# Patient Record
Sex: Male | Born: 1935
Health system: Southern US, Community
[De-identification: ages and names within clinical notes are randomized; demographics above are authoritative.]

## PROBLEM LIST (undated history)

## (undated) DIAGNOSIS — I639 Cerebral infarction, unspecified: Secondary | ICD-10-CM

## (undated) DIAGNOSIS — J111 Influenza due to unidentified influenza virus with other respiratory manifestations: Secondary | ICD-10-CM

## (undated) DIAGNOSIS — K5730 Diverticulosis of large intestine without perforation or abscess without bleeding: Secondary | ICD-10-CM

## (undated) DIAGNOSIS — M1990 Unspecified osteoarthritis, unspecified site: Secondary | ICD-10-CM

## (undated) DIAGNOSIS — M109 Gout, unspecified: Secondary | ICD-10-CM

## (undated) DIAGNOSIS — C61 Malignant neoplasm of prostate: Secondary | ICD-10-CM

## (undated) DIAGNOSIS — F329 Major depressive disorder, single episode, unspecified: Secondary | ICD-10-CM

## (undated) DIAGNOSIS — N281 Cyst of kidney, acquired: Secondary | ICD-10-CM

## (undated) DIAGNOSIS — E213 Hyperparathyroidism, unspecified: Secondary | ICD-10-CM

## (undated) DIAGNOSIS — I10 Essential (primary) hypertension: Secondary | ICD-10-CM

## (undated) DIAGNOSIS — N186 End stage renal disease: Secondary | ICD-10-CM

## (undated) DIAGNOSIS — Z8546 Personal history of malignant neoplasm of prostate: Secondary | ICD-10-CM

## (undated) DIAGNOSIS — E785 Hyperlipidemia, unspecified: Secondary | ICD-10-CM

## (undated) DIAGNOSIS — K649 Unspecified hemorrhoids: Secondary | ICD-10-CM

## (undated) DIAGNOSIS — N3040 Irradiation cystitis without hematuria: Secondary | ICD-10-CM

## (undated) DIAGNOSIS — Z9289 Personal history of other medical treatment: Secondary | ICD-10-CM

## (undated) DIAGNOSIS — D509 Iron deficiency anemia, unspecified: Secondary | ICD-10-CM

## (undated) HISTORY — DX: Cerebral infarction, unspecified: I63.9

## (undated) HISTORY — DX: Hyperparathyroidism, unspecified: E21.3

## (undated) HISTORY — DX: Hyperlipidemia, unspecified: E78.5

## (undated) HISTORY — DX: Gout, unspecified: M10.9

## (undated) HISTORY — DX: Cyst of kidney, acquired: N28.1

## (undated) HISTORY — DX: Major depressive disorder, single episode, unspecified: F32.9

## (undated) HISTORY — DX: Diverticulosis of large intestine without perforation or abscess without bleeding: K57.30

## (undated) HISTORY — PX: TONSILLECTOMY: SUR1361

## (undated) HISTORY — DX: Personal history of malignant neoplasm of prostate: Z85.46

## (undated) HISTORY — DX: Iron deficiency anemia, unspecified: D50.9

## (undated) HISTORY — PX: JOINT REPLACEMENT: SHX530

## (undated) HISTORY — DX: Essential (primary) hypertension: I10

---

## 2000-11-06 DIAGNOSIS — C61 Malignant neoplasm of prostate: Secondary | ICD-10-CM

## 2000-11-06 HISTORY — DX: Malignant neoplasm of prostate: C61

## 2001-08-19 ENCOUNTER — Other Ambulatory Visit: Admission: RE | Admit: 2001-08-19 | Discharge: 2001-08-19 | Payer: Self-pay | Admitting: Urology

## 2001-08-19 ENCOUNTER — Encounter (INDEPENDENT_AMBULATORY_CARE_PROVIDER_SITE_OTHER): Payer: Self-pay | Admitting: Specialist

## 2001-09-03 ENCOUNTER — Ambulatory Visit: Admission: RE | Admit: 2001-09-03 | Discharge: 2001-12-02 | Payer: Self-pay | Admitting: Radiation Oncology

## 2001-12-03 ENCOUNTER — Ambulatory Visit: Admission: RE | Admit: 2001-12-03 | Discharge: 2002-03-03 | Payer: Self-pay | Admitting: Radiation Oncology

## 2002-04-11 ENCOUNTER — Inpatient Hospital Stay (HOSPITAL_COMMUNITY): Admission: AD | Admit: 2002-04-11 | Discharge: 2002-04-18 | Payer: Self-pay | Admitting: Endocrinology

## 2002-04-13 ENCOUNTER — Encounter: Payer: Self-pay | Admitting: Internal Medicine

## 2002-04-17 ENCOUNTER — Encounter: Payer: Self-pay | Admitting: Internal Medicine

## 2004-07-18 ENCOUNTER — Ambulatory Visit (HOSPITAL_COMMUNITY): Admission: RE | Admit: 2004-07-18 | Discharge: 2004-07-18 | Payer: Self-pay | Admitting: Internal Medicine

## 2005-01-02 ENCOUNTER — Encounter: Admission: RE | Admit: 2005-01-02 | Discharge: 2005-01-02 | Payer: Self-pay | Admitting: Urology

## 2005-06-14 ENCOUNTER — Ambulatory Visit: Payer: Self-pay | Admitting: Internal Medicine

## 2005-07-19 ENCOUNTER — Ambulatory Visit: Payer: Self-pay | Admitting: Internal Medicine

## 2005-10-02 ENCOUNTER — Ambulatory Visit: Payer: Self-pay | Admitting: Internal Medicine

## 2005-10-27 ENCOUNTER — Ambulatory Visit (HOSPITAL_COMMUNITY): Admission: RE | Admit: 2005-10-27 | Discharge: 2005-10-27 | Payer: Self-pay | Admitting: Neurosurgery

## 2005-11-06 HISTORY — PX: OTHER SURGICAL HISTORY: SHX169

## 2005-11-27 ENCOUNTER — Ambulatory Visit: Payer: Self-pay | Admitting: Internal Medicine

## 2005-12-05 ENCOUNTER — Ambulatory Visit: Payer: Self-pay

## 2005-12-27 ENCOUNTER — Inpatient Hospital Stay (HOSPITAL_COMMUNITY): Admission: RE | Admit: 2005-12-27 | Discharge: 2006-01-01 | Payer: Self-pay | Admitting: Orthopedic Surgery

## 2006-12-12 ENCOUNTER — Encounter: Admission: RE | Admit: 2006-12-12 | Discharge: 2006-12-12 | Payer: Self-pay | Admitting: Nephrology

## 2007-01-01 ENCOUNTER — Encounter: Admission: RE | Admit: 2007-01-01 | Discharge: 2007-04-01 | Payer: Self-pay | Admitting: Nephrology

## 2007-01-01 ENCOUNTER — Ambulatory Visit: Payer: Self-pay | Admitting: Internal Medicine

## 2007-01-02 LAB — CONVERTED CEMR LAB
ALT: 33 units/L (ref 0–40)
Albumin: 3.2 g/dL — ABNORMAL LOW (ref 3.5–5.2)
BUN: 43 mg/dL — ABNORMAL HIGH (ref 6–23)
Basophils Relative: 0.6 % (ref 0.0–1.0)
Bilirubin, Direct: 0.1 mg/dL (ref 0.0–0.3)
Chloride: 108 meq/L (ref 96–112)
Cholesterol: 140 mg/dL (ref 0–200)
Eosinophils Absolute: 0.1 10*3/uL (ref 0.0–0.6)
GFR calc Af Amer: 30 mL/min
Glucose, Bld: 103 mg/dL — ABNORMAL HIGH (ref 70–99)
HDL: 43.8 mg/dL (ref >39.0)
Hemoglobin: 7.6 g/dL — CL (ref 13.0–17.0)
Lymphocytes Relative: 19 % (ref 12.0–46.0)
MCHC: 35.1 g/dL (ref 30.0–36.0)
MCV: 89.5 fL (ref 78.0–100.0)
Neutro Abs: 3.3 10*3/uL (ref 1.4–7.7)
Phosphorus: 3.6 mg/dL (ref 2.3–4.6)
RDW: 12.6 % (ref 11.5–14.6)
TSH: 0.98 microintl units/mL (ref 0.35–5.50)
Total CHOL/HDL Ratio: 3.2
Triglycerides: 95 mg/dL (ref 0–149)
Uric Acid, Serum: 7.5 mg/dL — ABNORMAL HIGH (ref 2.4–7.0)
VLDL: 19 mg/dL (ref 0–40)
WBC: 5.3 10*3/uL (ref 4.5–10.5)

## 2007-01-07 ENCOUNTER — Ambulatory Visit (HOSPITAL_COMMUNITY): Admission: RE | Admit: 2007-01-07 | Discharge: 2007-01-07 | Payer: Self-pay | Admitting: Internal Medicine

## 2007-02-05 HISTORY — PX: OTHER SURGICAL HISTORY: SHX169

## 2007-05-28 ENCOUNTER — Encounter: Admission: RE | Admit: 2007-05-28 | Discharge: 2007-05-28 | Payer: Self-pay | Admitting: Orthopedic Surgery

## 2007-05-30 ENCOUNTER — Ambulatory Visit (HOSPITAL_BASED_OUTPATIENT_CLINIC_OR_DEPARTMENT_OTHER): Admission: RE | Admit: 2007-05-30 | Discharge: 2007-05-30 | Payer: Self-pay | Admitting: Orthopedic Surgery

## 2008-01-03 ENCOUNTER — Encounter: Payer: Self-pay | Admitting: Internal Medicine

## 2008-01-07 ENCOUNTER — Telehealth: Payer: Self-pay | Admitting: Internal Medicine

## 2008-01-07 ENCOUNTER — Encounter: Payer: Self-pay | Admitting: Internal Medicine

## 2008-02-10 ENCOUNTER — Telehealth: Payer: Self-pay | Admitting: Internal Medicine

## 2008-02-10 ENCOUNTER — Encounter: Payer: Self-pay | Admitting: Internal Medicine

## 2008-02-20 ENCOUNTER — Encounter: Payer: Self-pay | Admitting: Internal Medicine

## 2008-03-09 ENCOUNTER — Ambulatory Visit: Payer: Self-pay | Admitting: Internal Medicine

## 2008-03-09 DIAGNOSIS — K5730 Diverticulosis of large intestine without perforation or abscess without bleeding: Secondary | ICD-10-CM

## 2008-03-09 DIAGNOSIS — Z8546 Personal history of malignant neoplasm of prostate: Secondary | ICD-10-CM

## 2008-03-09 DIAGNOSIS — E785 Hyperlipidemia, unspecified: Secondary | ICD-10-CM

## 2008-03-09 DIAGNOSIS — D509 Iron deficiency anemia, unspecified: Secondary | ICD-10-CM | POA: Insufficient documentation

## 2008-03-09 DIAGNOSIS — N186 End stage renal disease: Secondary | ICD-10-CM

## 2008-03-09 DIAGNOSIS — R5381 Other malaise: Secondary | ICD-10-CM | POA: Insufficient documentation

## 2008-03-09 DIAGNOSIS — I10 Essential (primary) hypertension: Secondary | ICD-10-CM | POA: Insufficient documentation

## 2008-03-09 DIAGNOSIS — M109 Gout, unspecified: Secondary | ICD-10-CM

## 2008-03-09 DIAGNOSIS — R5383 Other fatigue: Secondary | ICD-10-CM

## 2008-03-09 DIAGNOSIS — N259 Disorder resulting from impaired renal tubular function, unspecified: Secondary | ICD-10-CM | POA: Insufficient documentation

## 2008-03-09 HISTORY — DX: End stage renal disease: N18.6

## 2008-03-09 HISTORY — DX: Hyperlipidemia, unspecified: E78.5

## 2008-03-09 HISTORY — DX: Diverticulosis of large intestine without perforation or abscess without bleeding: K57.30

## 2008-03-09 HISTORY — DX: Personal history of malignant neoplasm of prostate: Z85.46

## 2008-03-09 HISTORY — DX: Gout, unspecified: M10.9

## 2008-03-09 HISTORY — DX: Essential (primary) hypertension: I10

## 2008-03-09 HISTORY — DX: Iron deficiency anemia, unspecified: D50.9

## 2008-05-18 ENCOUNTER — Telehealth: Payer: Self-pay | Admitting: Internal Medicine

## 2008-08-24 ENCOUNTER — Encounter: Payer: Self-pay | Admitting: Internal Medicine

## 2008-08-27 ENCOUNTER — Encounter: Payer: Self-pay | Admitting: Internal Medicine

## 2008-10-13 ENCOUNTER — Ambulatory Visit (HOSPITAL_COMMUNITY): Admission: RE | Admit: 2008-10-13 | Discharge: 2008-10-13 | Payer: Self-pay | Admitting: Nephrology

## 2008-11-06 DIAGNOSIS — N3040 Irradiation cystitis without hematuria: Secondary | ICD-10-CM

## 2008-11-06 HISTORY — DX: Irradiation cystitis without hematuria: N30.40

## 2009-03-01 ENCOUNTER — Encounter: Payer: Self-pay | Admitting: Internal Medicine

## 2009-03-11 ENCOUNTER — Encounter: Payer: Self-pay | Admitting: Internal Medicine

## 2009-03-15 ENCOUNTER — Encounter: Payer: Self-pay | Admitting: Internal Medicine

## 2009-05-31 ENCOUNTER — Telehealth: Payer: Self-pay | Admitting: Internal Medicine

## 2009-06-02 ENCOUNTER — Ambulatory Visit: Payer: Self-pay | Admitting: Internal Medicine

## 2009-06-22 ENCOUNTER — Encounter: Payer: Self-pay | Admitting: Internal Medicine

## 2009-07-20 ENCOUNTER — Encounter (INDEPENDENT_AMBULATORY_CARE_PROVIDER_SITE_OTHER): Payer: Self-pay | Admitting: *Deleted

## 2009-08-06 DIAGNOSIS — K649 Unspecified hemorrhoids: Secondary | ICD-10-CM

## 2009-08-06 HISTORY — DX: Unspecified hemorrhoids: K64.9

## 2009-08-11 ENCOUNTER — Ambulatory Visit: Payer: Self-pay | Admitting: Internal Medicine

## 2009-08-25 ENCOUNTER — Ambulatory Visit: Payer: Self-pay | Admitting: Internal Medicine

## 2009-09-15 ENCOUNTER — Encounter: Payer: Self-pay | Admitting: Internal Medicine

## 2009-10-04 ENCOUNTER — Encounter: Payer: Self-pay | Admitting: Internal Medicine

## 2009-12-17 ENCOUNTER — Encounter: Payer: Self-pay | Admitting: Internal Medicine

## 2010-03-16 ENCOUNTER — Encounter: Payer: Self-pay | Admitting: Internal Medicine

## 2010-04-11 ENCOUNTER — Encounter: Payer: Self-pay | Admitting: Internal Medicine

## 2010-04-18 ENCOUNTER — Encounter: Payer: Self-pay | Admitting: Internal Medicine

## 2010-06-03 ENCOUNTER — Encounter: Payer: Self-pay | Admitting: Internal Medicine

## 2010-06-03 ENCOUNTER — Ambulatory Visit: Payer: Self-pay | Admitting: Internal Medicine

## 2010-06-03 DIAGNOSIS — M25519 Pain in unspecified shoulder: Secondary | ICD-10-CM

## 2010-06-23 ENCOUNTER — Telehealth: Payer: Self-pay | Admitting: Internal Medicine

## 2010-07-07 ENCOUNTER — Encounter: Payer: Self-pay | Admitting: Internal Medicine

## 2010-07-13 ENCOUNTER — Encounter: Payer: Self-pay | Admitting: Internal Medicine

## 2010-09-21 ENCOUNTER — Encounter: Payer: Self-pay | Admitting: Internal Medicine

## 2010-10-07 ENCOUNTER — Encounter: Payer: Self-pay | Admitting: Internal Medicine

## 2010-12-04 LAB — CONVERTED CEMR LAB
ALT: 15 units/L (ref 0–53)
ALT: 16 units/L (ref 0–53)
AST: 19 units/L (ref 0–37)
Albumin: 4 g/dL (ref 3.5–5.2)
Albumin: 4.3 g/dL (ref 3.5–5.2)
Alkaline Phosphatase: 47 units/L (ref 39–117)
Alkaline Phosphatase: 77 units/L (ref 39–117)
BUN: 32 mg/dL — ABNORMAL HIGH (ref 6–23)
BUN: 52 mg/dL — ABNORMAL HIGH (ref 6–23)
Basophils Absolute: 0 10*3/uL (ref 0.0–0.1)
Basophils Relative: 0 % (ref 0.0–1.0)
Basophils Relative: 0.8 % (ref 0.0–3.0)
Bilirubin, Direct: 0.1 mg/dL (ref 0.0–0.3)
Bilirubin, Direct: 0.1 mg/dL (ref 0.0–0.3)
CO2: 29 meq/L (ref 19–32)
CO2: 30 meq/L (ref 19–32)
Calcium: 9.5 mg/dL (ref 8.4–10.5)
Chloride: 108 meq/L (ref 96–112)
Cholesterol: 162 mg/dL (ref 0–200)
Cholesterol: 169 mg/dL (ref 0–200)
Creatinine, Ser: 3 mg/dL — ABNORMAL HIGH (ref 0.4–1.5)
Eosinophils Absolute: 0.1 10*3/uL (ref 0.0–0.7)
Eosinophils Absolute: 0.2 10*3/uL (ref 0.0–0.7)
GFR calc Af Amer: 28 mL/min
GFR calc non Af Amer: 24 mL/min
Glucose, Bld: 81 mg/dL (ref 70–99)
Glucose, Bld: 95 mg/dL (ref 70–99)
HCT: 35.7 % — ABNORMAL LOW (ref 39.0–52.0)
HCT: 37.2 % — ABNORMAL LOW (ref 39.0–52.0)
HDL: 52.1 mg/dL (ref >39.00)
Hemoglobin: 12.1 g/dL — ABNORMAL LOW (ref 13.0–17.0)
Iron: 50 ug/dL (ref 42–165)
Iron: 77 ug/dL (ref 42–165)
LDL Cholesterol: 102 mg/dL — ABNORMAL HIGH (ref 0–99)
LDL Cholesterol: 81 mg/dL (ref 0–99)
LDL Cholesterol: 91 mg/dL (ref 0–99)
Lymphocytes Relative: 17.2 % (ref 12.0–46.0)
Lymphs Abs: 0.9 10*3/uL (ref 0.7–4.0)
MCV: 90.4 fL (ref 78.0–100.0)
Monocytes Absolute: 0.6 10*3/uL (ref 0.1–1.0)
Neutro Abs: 4 10*3/uL (ref 1.4–7.7)
Neutrophils Relative %: 66.2 % (ref 43.0–77.0)
Potassium: 4.9 meq/L (ref 3.5–5.1)
RDW: 14.6 % (ref 11.5–14.6)
RDW: 17.9 % — ABNORMAL HIGH (ref 11.5–14.6)
Saturation Ratios: 16.8 % — ABNORMAL LOW (ref 20.0–50.0)
Sodium: 139 meq/L (ref 135–145)
Sodium: 147 meq/L — ABNORMAL HIGH (ref 135–145)
TSH: 0.77 microintl units/mL (ref 0.35–5.50)
TSH: 0.99 microintl units/mL (ref 0.35–5.50)
TSH: 1.01 microintl units/mL (ref 0.35–5.50)
Total Bilirubin: 0.5 mg/dL (ref 0.3–1.2)
Total Bilirubin: 0.8 mg/dL (ref 0.3–1.2)
Total CHOL/HDL Ratio: 3
Total Protein: 6.7 g/dL (ref 6.0–8.3)
Total Protein: 6.8 g/dL (ref 6.0–8.3)
Total Protein: 7.2 g/dL (ref 6.0–8.3)
Transferrin: 213 mg/dL (ref 212.0–360.0)
Triglycerides: 88 mg/dL (ref 0.0–149.0)
Triglycerides: 99 mg/dL (ref 0.0–149.0)
WBC: 4.4 10*3/uL — ABNORMAL LOW (ref 4.5–10.5)
WBC: 4.7 10*3/uL (ref 4.5–10.5)

## 2010-12-06 NOTE — Letter (Signed)
Summary: External Correspondence  External Correspondence   Imported By: Phillis Knack 03/21/2010 10:37:14  _____________________________________________________________________  External Attachment:    Type:   Image     Comment:   External Document

## 2010-12-06 NOTE — Assessment & Plan Note (Signed)
Summary: yearly f/u/needs refills/cd - OK'D   Vital Signs:  Patient profile:   77 year old male Height:      69 inches Weight:      163 pounds BMI:     24.16 O2 Sat:      97 % on Room air Temp:     98.3 degrees F oral Pulse rate:   65 / minute BP sitting:   110 / 52  (left arm) Cuff size:   regular  Vitals Entered By: Shirlean Mylar Ewing CMA Deborra Medina) (June 03, 2010 2:05 PM)  O2 Flow:  Room air  Preventive Care Screening  Colonoscopy:    Next Due:  09/2019     declines pneumonia and flu shots  CC: Yearly/RE   CC:  Yearly/RE.  History of Present Illness: overall doing well,  does c/o left shoudle rpian for 4 to5 wks, localized but can radiate to the upper arm, achy like, more or less constant not;  worse to abduct certain ways;  no neck or radicular pain;  no more distal pain, weak, numbness.  No recetn injury or fall. IS s/p right rotater cuff, and left hip replacement, but no prior surgury on the left.  .Pt denies CP, sob, doe, wheezing, orthopnea, pnd, worsening LE edema, palps, dizziness or syncope  Pt denies new neuro symptoms such as headache, facial or extremity weakness  No fever, wt loss, night sweats, loss of appetite or other constitutional symptoms though has lost 11 lbs in the past yr.   Here for wellness Diet: Heart Healthy or DM if diabetic Physical Activities: goes to gyn 3 times per wk, as well as bowling Depression/mood screen: Negative Hearing: Intact bilateral Visual Acuity: Grossly normal, gets exam yearly ADL's: Capable  Fall Risk: None Home Safety: Good Cognitive Impairment:  Gen appearance, affect, speech, memory, attention & motor skills grossly intact End-of-Life Planning: Advance directive - Full code/I agree   Problems Prior to Update: 1)  Preventive Health Care  (ICD-V70.0) 2)  Shoulder Pain, Left  (ICD-719.41) 3)  Fatigue  (ICD-780.79) 4)  Anemia-iron Deficiency  (ICD-280.9) 5)  Renal Insufficiency  (ICD-588.9) 6)  Gout  (ICD-274.9) 7)   Hyperlipidemia  (ICD-272.4) 8)  Diverticulosis, Colon  (ICD-562.10) 9)  Hypertension  (ICD-401.9) 10)  Prostate Cancer, Hx of  (ICD-V10.46)  Medications Prior to Update: 1)  Atenolol 50 Mg  Tabs (Atenolol) .... 1/2 Tab By Mouth Once Daily 2)  Allopurinol 100 Mg  Tabs (Allopurinol) .Marland Kitchen.. 1po Once Daily 3)  Crestor 20 Mg Tabs (Rosuvastatin Calcium) .Marland Kitchen.. 1 By Mouth Once Daily 4)  Norvasc 2.5 Mg Tabs (Amlodipine Besylate) .... Take 1 Tablet By Mouth Once A Day 5)  Hectorol 0.5 Mcg  Caps (Doxercalciferol) .Marland Kitchen.. 1 By Mouth Once Daily 6)  Adult Aspirin Ec Low Strength 81 Mg  Tbec (Aspirin) .Marland Kitchen.. 1po Qd  Current Medications (verified): 1)  Atenolol 50 Mg  Tabs (Atenolol) .... 1/2 Tab By Mouth Once Daily 2)  Allopurinol 100 Mg  Tabs (Allopurinol) .Marland Kitchen.. 1po Once Daily 3)  Crestor 20 Mg Tabs (Rosuvastatin Calcium) .Marland Kitchen.. 1 By Mouth Once Daily 4)  Norvasc 2.5 Mg Tabs (Amlodipine Besylate) .... Take 1 Tablet By Mouth Once A Day 5)  Hectorol 0.5 Mcg  Caps (Doxercalciferol) .Marland Kitchen.. 1 By Mouth Once Daily 6)  Adult Aspirin Ec Low Strength 81 Mg  Tbec (Aspirin) .Marland Kitchen.. 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 hx of pneumonia 2003 secondary hyperparathyroidism  MD roster:  sees also optometry for glasses  Past Surgical History: Tonsillectomy Rotator cuff repair - right - 4/08 -  s/p left hip replacement - 2007 - Dr Murphy/ortho  Review of Systems  The patient denies anorexia, fever,  vision loss, decreased hearing, hoarseness, chest pain, syncope, dyspnea on exertion, peripheral edema, prolonged cough, headaches, hemoptysis, abdominal pain, melena, hematochezia, severe indigestion/heartburn, hematuria, muscle weakness, suspicious skin lesions, difficulty walking, depression, unusual weight change, abnormal bleeding, enlarged lymph nodes, and angioedema.         all otherwise negative per pt -    Physical Exam  General:  alert and well-developed.   Head:  normocephalic and atraumatic.   Eyes:  vision grossly intact, pupils equal, and pupils round.   Ears:  R ear normal and L ear normal.   Nose:  no external deformity and no nasal discharge.   Mouth:  no gingival abnormalities and pharynx pink and moist.   Neck:  supple and no masses.   Lungs:  normal respiratory effort and normal breath sounds.   Heart:  normal rate and regular rhythm.   Abdomen:  soft, non-tender, and normal bowel sounds.   Msk:  no joint tenderness and no joint swelling.  except for mild left subacromail bursa area tender Extremities:  no edema, no erythema  Neurologic:  cranial nerves II-XII intact and strength normal in all extremities.     Impression & Recommendations:  Problem # 1:  Preventive Health Care (ICD-V70.0)  Overall doing well, age appropriate education and counseling updated and referral for appropriate preventive services done unless declined, immunizations up to date or declined, diet counseling done if overweight, urged to quit smoking if smokes , most recent labs reviewed and current ordered if appropriate, ecg reviewed or declined (interpretation per ECG scanned in the EMR if done); information regarding Medicare Prevention requirements given if appropriate; speciality referrals updated as appropriate   Orders: EKG w/ Interpretation (93000) First annual wellness visit with prevention plan  [id number])  Problem # 2:  SHOULDER PAIN, LEFT (ICD-719.41)  His updated medication list  for this problem includes:    Adult Aspirin Ec Low Strength 81 Mg Tbec (Aspirin) .Marland Kitchen... 1po qd ? bursitis/rot cuff - to refer to ortho  Orders: Orthopedic Surgeon Referral (Ortho Surgeon)  Problem # 3:  ANEMIA-IRON DEFICIENCY (ICD-280.9) for lab f/u today, asympt, no overt recent bleeding or bruising Orders: TLB-Hepatic/Liver Function Pnl (80076-HEPATIC) TLB-CBC Platelet - w/Differential (85025-CBCD) TLB-IBC Pnl (Iron/FE;Transferrin) (83550-IBC)  Problem # 4:  RENAL INSUFFICIENCY (ICD-588.9) chronic, sees Renal on regular basis, to check f/u labs today, Continue all previous medications as before this visit  Orders: TLB-BMP (Basic Metabolic Panel-BMET) (99991111) TLB-B12 + Folate Pnl [id number])  Problem # 5:  HYPERLIPIDEMIA (ICD-272.4)  His updated medication list for this problem includes:    Crestor 20 Mg Tabs (Rosuvastatin calcium) .Marland Kitchen... 1 by mouth once daily  Orders: TLB-Lipid Panel (80061-LIPID)  Labs Reviewed: SGOT: 19 (06/02/2009)   SGPT: 16 (06/02/2009)   HDL:52.10 (06/02/2009), 45.8 (03/09/2008)  LDL:91 (06/02/2009), 102 (03/09/2008)  Chol:161 (06/02/2009), 169 (03/09/2008)  Trig:88.0 (06/02/2009), 105 (  03/09/2008) stable overall by hx and exam, ok to continue meds/tx as is , Pt to continue diet efforts, good med tolerance; to check labs - goal LDL less than 70   Problem # 6:  HYPERTENSION (ICD-401.9)  His updated medication list for this problem includes:    Atenolol 50 Mg Tabs (Atenolol) .Marland Kitchen... 1/2 tab by mouth once daily    Norvasc 2.5 Mg Tabs (Amlodipine besylate) .Marland Kitchen... Take 1 tablet by mouth once a day  BP today: 110/52 Prior BP: 146/82 (06/02/2009)  Labs Reviewed: K+: 4.7 (06/02/2009) Creat: : 3.0 (06/02/2009)   Chol: 161 (06/02/2009)   HDL: 52.10 (06/02/2009)   LDL: 91 (06/02/2009)   TG: 88.0 (06/02/2009) stable overall by hx and exam, ok to continue meds/tx as is   Orders: Prescription Created Electronically [phone])  Complete Medication  List: 1)  Atenolol 50 Mg Tabs (Atenolol) .... 1/2 tab by mouth once daily 2)  Allopurinol 100 Mg Tabs (Allopurinol) .Marland Kitchen.. 1po once daily 3)  Crestor 20 Mg Tabs (Rosuvastatin calcium) .Marland Kitchen.. 1 by mouth once daily 4)  Norvasc 2.5 Mg Tabs (Amlodipine besylate) .... Take 1 tablet by mouth once a day 5)  Hectorol 0.5 Mcg Caps (Doxercalciferol) .Marland Kitchen.. 1 by mouth once daily 6)  Adult Aspirin Ec Low Strength 81 Mg Tbec (Aspirin) .Marland Kitchen.. 1po qd  Other Orders: TLB-TSH (Thyroid Stimulating Hormone) (84443-TSH)  Patient Instructions: 1)  Continue all previous medications as before this visit  2)  Please go to the Lab in the basement for your blood and/or urine tests today 3)  You will be contacted about the referral(s) to: Orthopedic 4)  Please keep your regular appts with Dr Deterding, and Dr Jeffie Pollock as you do 5)  Please schedule a follow-up appointment in 1 year or sooner if needed Prescriptions: HECTOROL 0.5 MCG  CAPS (DOXERCALCIFEROL) 1 by mouth once daily  #90 x 3   Entered and Authorized by:   [name] MD   Signed by:   [name] MD on 06/03/2010   Method used:   Print then Give to Patient   RxID:   [id number] NORVASC 2.5 MG TABS (AMLODIPINE BESYLATE) Take 1 tablet by mouth once a day  #90 x 3   Entered and Authorized by:   [name] MD   Signed by:   [name] MD on 06/03/2010   Method used:   Print then Give to Patient   RxID:   [id number] CRESTOR 20 MG TABS (ROSUVASTATIN CALCIUM) 1 by mouth once daily  #90 x 3   Entered and Authorized by:   [name] MD   Signed by:   [name] MD on 06/03/2010   Method used:   Print then Give to Patient   [id number] ALLOPURINOL 100 MG  TABS (ALLOPURINOL) 1po once daily  #90 x 3   Entered and Authorized by:   [name] MD   Signed by:   [name] MD on 06/03/2010   Method used:   Print then Give to Patient   RxID:   [id number] ATENOLOL 50 MG  TABS (ATENOLOL) 1/2 tab by mouth once daily  #45 x 3    Entered and Authorized by:   [name] MD   Signed by:   [name] MD on 06/03/2010   Method used:   Print then Give to Patient   RxID:   [id number]

## 2010-12-06 NOTE — Letter (Signed)
Summary: [facility]   Imported By: Phillis Knack 07/27/2010 [id number]  _____________________________________________________________________  External Attachment:    Type:   Image     Comment:   External Document

## 2010-12-06 NOTE — Letter (Signed)
Summary: [facility]   Imported By: Bubba Hales 05/05/2010 09:36:21  _____________________________________________________________________  External Attachment:    Type:   Image     Comment:   External Document

## 2010-12-06 NOTE — Letter (Signed)
Summary: [facility]   Imported By: Bubba Hales 09/27/2010 08:39:10  _____________________________________________________________________  External Attachment:    Type:   Image     Comment:   External Document

## 2010-12-06 NOTE — Letter (Signed)
Summary: [facility]   Imported By: Bubba Hales 01/06/2010 08:46:44  _____________________________________________________________________  External Attachment:    Type:   Image     Comment:   External Document

## 2010-12-06 NOTE — Progress Notes (Signed)
Summary: Rx refill req  Phone Note Refill Request Message from:  Patient on June 23, 2010 2:20 PM  Refills Requested: Medication #1:  ATENOLOL 50 MG  TABS 1/2 tab by mouth once daily   Dosage confirmed as above?Dosage Confirmed   Supply Requested: 1 year  Medication #2:  ALLOPURINOL 100 MG  TABS 1po once daily   Dosage confirmed as above?Dosage Confirmed   Supply Requested: 1 year  Medication #3:  CRESTOR 20 MG TABS 1 by mouth once daily   Dosage confirmed as above?Dosage Confirmed   Supply Requested: 1 year  Medication #4:  NORVASC 2.5 MG TABS Take 1 tablet by mouth once a day   Dosage confirmed as above?Dosage Confirmed   Supply Requested: 1 year  Method Requested: Electronic Initial call taken by: Crissie Sickles, CMA,  June 23, 2010 2:20 PM    Prescriptions: NORVASC 2.5 MG TABS (AMLODIPINE BESYLATE) Take 1 tablet by mouth once a day  #90 x 3   Entered by:   Crissie Sickles, CMA   Authorized by:   [name] MD   Signed by:   Crissie Sickles, CMA on 06/23/2010   Method used:   Faxed to ...       [facility] (mail-order)             , [place]         Ph: [phone]       Fax: [phone]   RxID:   [id number] CRESTOR 20 MG TABS (ROSUVASTATIN CALCIUM) 1 by mouth once daily  #90 x 3   Entered by:   Crissie Sickles, CMA   Authorized by:   [name] MD   Signed by:   Crissie Sickles, CMA on 06/23/2010   Method used:   Faxed to ...       [facility] (mail-order)             , [place]         Ph: [phone]       Fax: [phone]   [id number] ALLOPURINOL 100 MG  TABS (ALLOPURINOL) 1po once daily  #90 x 3   Entered by:   Crissie Sickles, CMA   Authorized by:   [name] MD   Signed by:   Crissie Sickles, CMA on 06/23/2010   Method used:   Faxed to ...       [facility] (mail-order)             , [place]         Ph: [phone]       Fax: [phone]   [id number] ATENOLOL 50 MG  TABS (ATENOLOL)  1/2 tab by mouth once daily  #45 x 3   Entered by:   Crissie Sickles, CMA   Authorized by:   [name] MD   Signed by:   Crissie Sickles, CMA on 06/23/2010   Method used:   Faxed to ...       [facility] (mail-order)             , [place]         Ph: [phone]       Fax: [phone]   RxID:   [id number]

## 2010-12-08 NOTE — Letter (Signed)
Summary: [facility]   Imported By: Bubba Hales 10/21/2010 10:26:29  _____________________________________________________________________  External Attachment:    Type:   Image     Comment:   External Document

## 2011-03-06 ENCOUNTER — Encounter: Payer: Self-pay | Admitting: Internal Medicine

## 2011-03-21 NOTE — Op Note (Signed)
[name], [name]                ACCOUNT NO.:  [id number]   MEDICAL RECORD NO.:  [id number]          PATIENT TYPE:  AMB   LOCATION:  [place]                          FACILITY:  [facility]   PHYSICIAN:  Rodney A. Mortenson, M.D.DATE OF BIRTH:  [birth year]   DATE OF PROCEDURE:  05/30/2007  DATE OF DISCHARGE:                               OPERATIVE REPORT   JUSTIFICATION:  A 77 year old man referred here courtesy of Dr. Jeneen Rinks  Deterding following a fall on the shoulder from a ladder three months  ago.  Significant pain and discomfort.  He has good range of motion  about the shoulder, some weakness to abduction.  Admission testing is  quite painful.  An MRI of the shoulder was done and this shows a massive  complete supraspinatus tear of the right shoulder with proximal  retraction of 4 to 5 cm.  There is bulky acromioclavicular  osteoarthritis.  Because persistent pain and discomfort he is now  admitted for surgical reconstruction.  Complication discussed  preoperatively.  Questions answered and encouraged.   PREOPERATIVE DIAGNOSIS:  Massive retracted rotator cuff tear, right  shoulder.   POSTOPERATIVE DIAGNOSIS:  Massive rotator cuff tear right shoulder; torn  anterior labrum; extensive synovitis right shoulder.   OPERATION:  Diagnostic arthroscopy; synovectomy right shoulder; debride  the anterior labrum; acromioplasty; repair of massive torn retracted  rotator cuff, complex extensive reconstruction.   SURGEON:  Geroge Baseman. Alphonzo Cruise, M.D.   ASSISTANT:  Vonita Moss. Duffy, P.A.   ANESTHESIA:  General.   PROCEDURE:  The patient placed on the operating table in supine  position.  After satisfactory general anesthesia he was placed in semi-  sitting position.  Right upper extremity and shoulder prepped with  DuraPrep and draped out in the usual manner.  Through standard posterior  portal arthroscope was introduced in the glenohumeral joint.  An  anterior operative portal was made in the  standard manner and position.  There was a massive rotator cuff tear and the humeral head was at least  60% uncovered.  There was an extensive synovitis about the shoulder and  through the anterior portal, intra-articular shaver was introduced and  synovectomy of the shoulder was then done.  There was fraying and  tearing of the anterior labrum which was not repairable and this torn  labrum was debrided with the intra-articular shaver.  Once this was  completed to my satisfaction the scope was placed subacromial position  and the huge tear could be seen.  There was an  anterior posterior leaf  which was retracted from each other, split longitudinally.  The  arthroscope was then removed and it was elected to open shoulder.   Saber cut incision made over the anterolateral aspect of the right  shoulder.  Skin edges were retracted.  Bleeders were coagulated.  Deltoid fibers released off the anterior aspect of acromion only.  This  is a type 3 acromion and the anterior leading edge was significantly  deformed.  An acromioplasty was then done.  This gave much better access  to the shoulder.  There is a great deal of  the bulky enlarged bursa and  this was all removed which gave excellent access and the visibility to  the shoulder.  There were huge tears of the rotator cuff the head was  almost totally uncovered.  This a very complex problem.  The leaves were  freed up and then could be retracted distally down to the anatomic  position.  Two Mitek anchors were used around the tuberosity.  Each leaf  could be pulled out independently and reattached in anatomic position.  Multiple sutures were used for the anterior and posterior portions of  the cuff.  Then a side-to-side repair was also done.  Complete  watertight repair was used using a heavy Mitek sutures.  I was very  pleased with surgical reconstruction once this was completed.  This good  range of motion and this passed easily underneath the  acromion.  Shoulder was irrigated with copious amounts saline solution.  Deltoid  fibers reattached with 0-0 Vicryl and 2-0 Vicryl used to close  subcutaneous tissue.  Stainless steel staples used to close the skin.  Sterile dressings were applied and the patient returned recovery room in  nice condition.   Again this was extremely complex extensive reconstruction which took  significant additional time.   DISPOSITION:  1. Discharge to home with  sling.  2. To my office next week.  3. Usual postop instructions given.  4. Percocet for pain.           ______________________________  Geroge Baseman. Alphonzo Cruise, M.D.     RAM/MEDQ  D:  05/30/2007  T:  05/31/2007  Job:  [id number]

## 2011-03-24 NOTE — Op Note (Signed)
NAMEJULIUS, [name] NO.:  [id number]   MEDICAL RECORD NO.:  [id number]          PATIENT TYPE:  INP   LOCATION:  5039                         FACILITY:  [facility]   PHYSICIAN:  Ninetta Lights, M.D. DATE OF BIRTH:  December 12, 1928   DATE OF PROCEDURE:  12/27/2005  DATE OF DISCHARGE:                                 OPERATIVE REPORT   PREOPERATIVE DIAGNOSES:  End stage degenerative arthritis, left hip,  underlying avascular necrosis with degenerative cystic change especially in  the acetabulum.   POSTOPERATIVE DIAGNOSES:  End stage degenerative arthritis, left hip,  underlying avascular necrosis with degenerative cystic change especially in  the acetabulum.   OPERATION PERFORMED:  Total hip replacement on the left.  Autologous  cancellous bone grafting to subchondral cyst, acetabulum.  [employer]  Accolade prosthesis.  Press-fit 56 mm metallic acetabular component screw  fixation times two.  40 degree internal diameter X3 polyethylene liner.  Femoral component size 3.5.  127 degree neck angle, 35 degree neck and a 40  mm plus 0 metallic head.   SURGEON:  Ninetta Lights, M.D.   ASSISTANT:  Alyson Locket. Velora Heckler.   ANESTHESIA:  General.   ESTIMATED BLOOD LOSS:  Less than 200 mL.   BLOOD REPLACED:  None.   SPECIMENS:  None.   CULTURES:  None.   COMPLICATIONS:  None.   DRESSING:  Soft compressive with abduction pillow.   DESCRIPTION OF PROCEDURE:  The patient was brought to the operating room and  after adequate anesthesia had been obtained, turned to a lateral position  with appropriate padding and support.  Prepped and draped in the usual  sterile fashion.  Involved left leg was about 1 cm or less shorter.  Incision along the shaft of the femur extending posterior and superior.  Skin and subcutaneous tissue divided.  Hemostasis cautery.  Iliotibial band  incised.  Charnley retractor put in place.  Neurovascular structures  identified and protected.   External rotator and capsule taken down to the  back of the intertrochanteric groove in the femur.  Hip dislocated  posteriorly.  Femoral neck cut one fingerbreadth above the lesser trochanter  in line with the definitive component.  Femoral head removed.  Grade 4  changes, bony collapse, delamination, all debris cleared.  Acetabulum  exposed. Sequential reaming up to good bleeding bone appropriate inferior  and medial placement and removal of periarticular spurs.  Sized for a 56 mm  component.  Extensive subchondral cyst extending 1 to 2 cm into the bone  above were completely curetted out back to healthy bone.  Cancellous bone  harvested from the femoral head firmly packed into the defect and then  smoothed off with the reamer placed in reverse.  The 56 mm component was  then put in place.  Hammered in with nice fitting capturing and alignment.  Place in 45 degrees of abduction and 20 degrees of anteversion.  Firmly  fixed with placement augmented with two screws placed through the cup.  A 40  mm diameter polyethylene X3 insert was then attached.  I was very pleased  with the alignment, capture and fixation of component.  Attention was turned  to the femur.  Distal and proximal reamers, appropriate sizing and  restoration of femoral anteversion.  Sized for a 3.5 femoral component.  After appropriate trials, a definitive component was seated.  Excellent  capturing and fixation.  With the 35 mm neck on the prosthesis and the 40 mm  +0 head attached, the hip was reduced.  Excellent restoration of leg  lengths.  Excellent congruity.  Good stability in flexion and extension.  Wound irrigated.  External rotator and capsule repaired to the back of the  intertrochanteric groove through drill holes, tied over a bony bridge.  Charnley retractor removed.  Iliotibial band closed with #1 Vicryl, skin and  subcutaneous tissue with Vicryl and staples.  Margins of the wound injected  with Marcaine.   Sterile compressive dressing applied.  Returned to supine  position.  Abduction pillow applied.  Anesthesia reversed.  Brought to  recovery room.  Tolerated surgery well without complication.      Ninetta Lights, M.D.  Electronically Signed     DFM/MEDQ  D:  12/27/2005  T:  12/28/2005  Job:  [id number]

## 2011-03-24 NOTE — Discharge Summary (Signed)
[place]. [facility]  Patient:    [name], [name] Visit Number: [id number] MRN: [id number]          Service Type: MED Location: [phone] Attending Physician:  Gloris Ham Dictated by:   Donaciano Eva, N.P. Admit Date:  04/11/2002 Discharge Date: 04/18/2002                             Discharge Summary  ADMISSION DIAGNOSES: Pneumonia.  DISCHARGE DIAGNOSES: Pneumonia.  PRIMARY CARE PHYSICIAN:  Dr. [name], [facility].  SIGNIFICANT LABORATORY DATA: During admission the CBC on April 17, 2002 shows white blood cell count 10.3, hemoglobin and hematocrit 8.1 and 24.1, platelet count 420K, RDW 15.2.  Hemoccult negative.  BMET on April 14, 2002 sodium 138, potassium 4.5, glucose 124, BUN 32, creatinine 2.1, calcium low at 8.3, albumin at 2.1.  There was a note in the blood work that the serum was slightly lipemic, therefore an office follow up on lipid level should be conducted.  Liver enzymes as follows:  On April 18, 2002 the AST had decreased from a high of 196 to 100, ALT was at 168, ALP was 148, total bilirubin 0.5, D-bilirubin less than 0.1, I-bilirubin was not calculated.  Anemia study as follows:  The iron was low at 35, total iron binding capacity was normal at 220, percent saturation low at 16, B12 normal at 550, folate normal at 13.6, ferritin elevated at 6643.  Folate level 13.6.  TSH normal at 0.911.  Blood cultures remained negative.  Significant diagnostics April 13, 2002 anterior right upper lobe and right middle lobe air space infiltrate consistent with pneumonia. April 17, 2002 chest x-ray stable, right upper lobe and right middle lobe air space infiltrates, new patchy lingular infiltrates.  DISCHARGE MEDICATIONS: Patient is to restart his home medications. 1. New medications:  FeSO4 325 mg q.d. with food. 2. Ceftin 250 mg one pill b.i.d. X3 more days per Dr. Jenny Reichmann.  HOSPITAL COURSE:  The patient is a pleasant gentleman who was  admitted complaining of weakness, elevated temperature, dry cough which was painful and localized to the anterior chest. When examined the patient was found to have the above-noted pneumonia on chest x-ray and was admitted for intravenous antibiotics and supportive care.  During his hospitalization his other concerns were maintaining his hypertension, monitoring his renal insufficiency.  The patient had also elevated transaminases, question if this was due to the pneumonia.  His normocytic, normochromic anemia, there was a question whether this was old or new.  The patient indicated it was old and also that he was being followed by Dr. Jeffie Pollock for history of prostate cancer, he is status post XRT X42 treatments.  His last PSA level was one month prior to admission and was at 2.7.  During this admission the patient was also given the Pneumovax vaccine on April 14, 2002.  He was hydrated, provided with oxygen via nasal cannula.  He was placed on Zithromax and Rocephin initially intravenously and then moved to p.o.  He was monitored during his stay for progress and indeed he improved on a daily basis.  The patient was a pleasant gentleman who performed all of his activities in his room and was involved with his progress.  PHYSICAL EXAMINATION ON DISCHARGE:  The patients vital signs were normal.  He is offering no complaints.  Temperature 97.1, pulse 86, respirations 20, blood pressure 129/68, 98% oxygen saturation on room  air.  His in and out was at 800 that morning.  His blood cultures remained negative X3 tubes with that as the final.  GENERAL:  He is awake, alert and oriented X3.  HEART:  His heart rate was regular.  CHEST:  Lungs had an initial left crackle which improved with deep breathing and coughing.  There was no wheezing.  EXTREMITIES:  There was no edema.  He was ambulating.  ABDOMEN:  Soft with good bowel sounds and nontender.  Overall the patient is eager to go home.  His  medications had been switched to p.o. and he is to continue until the completion of his antibiotic therapy and to follow up with Dr. Jenny Reichmann.  He is to call Dr. Jenny Reichmann for any temperature over 100 or if he has any concerns. Dictated by:   Donaciano Eva, N.P. Attending Physician:  Gloris Ham DD:  04/23/02 TD:  04/24/02 Job: [id number]

## 2011-03-24 NOTE — Discharge Summary (Signed)
[name], [name] NO.:  [id number]   MEDICAL RECORD NO.:  [id number]          PATIENT TYPE:  INP   LOCATION:  5039                         FACILITY:  [facility]   PHYSICIAN:  Ninetta Lights, M.D. DATE OF BIRTH:  [birth year]   DATE OF ADMISSION:  12/27/2005  DATE OF DISCHARGE:  01/01/2006                                 DISCHARGE SUMMARY   FINAL DIAGNOSES:  1.  Status post left total knee replacement for end-stage degenerative joint      disease.  2.  Hypertension.  3.  Long term use of anticoagulants.  4.  Hyperlipidemia.  5.  Gout.   HISTORY OF PRESENT ILLNESS:  A 77 year old black male with a history of end-  stage DJD of left hip and chronic pain presents to our office for  preoperative evaluation and treatment.  He has had progressively worsening  pain which failure to respond to conservative treatment.  He has been having  decrease in daily activities due to the ongoing complaint.   HOSPITAL COURSE:  On December 27, 2005, the patient was taken to the OR and  left total hip replacement procedure performed.   SURGEON:  Ninetta Lights, M.D.   ASSISTANT:  __________.   ANESTHESIA:  General.   ESTIMATED BLOOD LOSS:  200 ml.   There were no surgical or anesthesia complications.  The patient was  transferred to the recovery room in stable condition.  On December 28, 2005,  patient was doing well with good pain control.  Vital signs stable.  Afebrile.  Hemoglobin 8.3, glucose 148, INR 1.3.  Started on pharmacy  protocol Coumadin.  Wound looked good.  Staples intact.  Calves nontender.  Neurovascularly intact.  Monitored hemoglobin.  The patient has a history of  chronic anemia.   On December 29, 2005, the patient with good pain control.  Complained of  feeling weak and fatigued.  No chest pain or shortness of breath.  Temp  101.5, pulse 107, respirations 18, blood pressure 122/65.  Hemoglobin is  7.8, hematocrit 22.5.  Sodium 134, potassium 4.2,  chloride 105, CO2 21, BUN  24, creatinine 2, glucose 132, INR 1.7.  The wound looked good.  Staples  intact.  No drains or signs of infection.  Calf nontender.  Neurovascularly  intact.  Transfused 2 units of packed red blood cells.  DC Foley, hep-lock  IV after transfusion.  DC PCA.  Encourage incentive spirometry.   On December 29, 2005, patient with good pain control.  Temp 100.7, pulse 94,  respirations 20, blood pressure 125/64.  Hemoglobin 9.4, INR 1.7.   On December 31, 2005, the patient doing well.  Good pain control.  Temp 101.  Hemoglobin 9.2.  No bowel movement.  A Fleet ordered.   On January 01, 2006, the patient was doing well with good pain control.  Good hall ambulation and stairs completed without difficulty.  Had bowel  movement.  Ready to go home.  Vital signs stable.  Afebrile.  Hemoglobin  9.4, sodium 130, INR 1.7.  The wound looked good.  Staples intact.  No  signs  of infection.  Calf nontender.  Neurovascularly intact.   MEDICATIONS:  1.  Percocet 5/325 1-2 tabs p.o. q.4-6h. p.r.n. pain.  2.  Robaxin 500 mg 1 tab p.o. q.6h. p.r.n. spasms.  3.  Coumadin to be dosed by home health RN.  4.  Resume previous home meds.   CONDITION:  Good, stable.   INSTRUCTIONS:  Patient to work with home health PT/OT to improve ambulation  and strengthening.  Will follow up in the office two weeks postoperatively  for recheck.  Will remain on Coumadin x4 weeks for postoperative DVT  prophylaxis.      Ninetta Lights, M.D.  Electronically Signed     Ninetta Lights, M.D.  Electronically Signed    DFM/MEDQ  D:  03/12/2006  T:  03/12/2006  Job:  [id number]

## 2011-03-24 NOTE — H&P (Signed)
[place]. [facility]  Patient:    [name], LASOTA Visit Number: [id number] MRN: [id number]          Service Type: MED Location: [phone] Attending Physician:  Gloris Ham Dictated by:   Gloris Ham, M.D. [facility] Admit Date:  04/11/2002                           History and Physical  DATE OF BIRTH:  08/05/2029  REASON FOR ADMISSION:  Pneumonia.  HISTORY OF PRESENT ILLNESS:  The patient is a 77 year old man with several days of a dry cough which is painful and localized to the anterior chest.  He also has associated anorexia and severe fatigue.  PAST MEDICAL HISTORY: 1. Hypertension. 2. Prostate cancer. 3. Hemorrhoids. 4. Diverticulosis. 5. Dyslipidemia.  MEDICATIONS: 1. Atenolol 50 mg daily. 2. Aspirin 81 mg daily. 3. Hydrochlorothiazide 12.5 mg daily.  FAMILY HISTORY:  No one else at home is ill.  SOCIAL HISTORY:  The patient is married.  He is retired.  His wife is with him today.  REVIEW OF SYSTEMS:  Denies weight gain, weight loss, syncope, shortness of breath, diarrhea, nausea, vomiting, abdominal pain, rectal bleeding, dysuria, and skin rash.  PHYSICAL EXAMINATION:  The blood pressure is 140/60, heart rate 88, respiratory rate 20, and temperature 101.6 degrees.  WEIGHT:  178 pounds.  GENERAL APPEARANCE:  Appears slightly ill.  SKIN:  No diaphoretic.  NODES:  There is no palpable lymphadenopathy.  HEENT:  The head is atraumatic.  Sclerae nonicteric.  Pharynx clear.  NECK:  Supple.  No goiter.  CHEST:  Clear to auscultation.  No rales are heard.  CARDIOVASCULAR:  No JVD.  No edema.  Regular rate and rhythm.  No murmur. Pedal pulses are intact.  ABDOMEN:  Soft.  Nontender.  No hepatosplenomegaly.  No mass.  No hernia.  GENITALIA AND RECTAL:  Examination not done at this time due to the patients condition.  EXTREMITIES:  No obvious deformities of the joints.  NEUROLOGIC:  Alert and well oriented.  Gait is  observed to be normal in the office.  LABORATORY DATA:  Oxygen saturation 93% on room air.  Urinalysis positive for blood (chronic).  The chest x-ray shows an infiltrate obscuring much of the lower half of the right lung field.  IMPRESSION: 1. Right-sided pneumonia. 2. Other chronic medical problems as noted above.  PLAN: 1. Blood cultures. 2. Antibiotics. 3. Bronchodilators as needed. 4. Symptomatic therapy. 5. Oxygen as needed. 6. Continue outpatient medications.  I discussed with the patient and his wife the possibility of going home versus hospitalization.  We weighed the risk of him going home, which would include a possible threat to his health without urgent parenteral antibiotic therapy. Because of this risk, the patient and his wife agree to hospitalization. Dictated by:   Gloris Ham, M.D. [place] Attending Physician:  Gloris Ham DD:  04/11/02 TD:  04/13/02 Job: [id number]

## 2011-06-07 ENCOUNTER — Other Ambulatory Visit: Payer: Self-pay | Admitting: Nephrology

## 2011-06-07 DIAGNOSIS — N189 Chronic kidney disease, unspecified: Secondary | ICD-10-CM

## 2011-06-08 ENCOUNTER — Ambulatory Visit
Admission: RE | Admit: 2011-06-08 | Discharge: 2011-06-08 | Disposition: A | Discharge status: Home / Self Care | Payer: Medicare Other | Source: Ambulatory Visit | Attending: Nephrology | Admitting: Nephrology

## 2011-06-08 DIAGNOSIS — N189 Chronic kidney disease, unspecified: Secondary | ICD-10-CM

## 2011-06-16 ENCOUNTER — Other Ambulatory Visit: Payer: Self-pay

## 2011-06-16 MED ORDER — ATENOLOL 50 MG PO TABS: ORAL_TABLET | ORAL | Status: DC

## 2011-06-16 MED ORDER — AMLODIPINE BESYLATE 2.5 MG PO TABS: 2.5000 mg | ORAL_TABLET | Freq: Every day | ORAL | Status: DC

## 2011-06-16 MED ORDER — ALLOPURINOL 100 MG PO TABS: 100.0000 mg | ORAL_TABLET | Freq: Every day | ORAL | Status: DC

## 2011-06-17 ENCOUNTER — Encounter: Payer: Self-pay | Admitting: Internal Medicine

## 2011-06-17 DIAGNOSIS — Z0000 Encounter for general adult medical examination without abnormal findings: Secondary | ICD-10-CM | POA: Insufficient documentation

## 2011-06-19 ENCOUNTER — Encounter: Payer: Self-pay | Admitting: Internal Medicine

## 2011-06-19 ENCOUNTER — Ambulatory Visit (INDEPENDENT_AMBULATORY_CARE_PROVIDER_SITE_OTHER): Payer: Medicare Other | Admitting: Internal Medicine

## 2011-06-19 ENCOUNTER — Other Ambulatory Visit (INDEPENDENT_AMBULATORY_CARE_PROVIDER_SITE_OTHER): Payer: Medicare Other

## 2011-06-19 VITALS — BP 130/62 | HR 64 | Temp 97.8°F | Ht 69.0 in | Wt 167.1 lb

## 2011-06-19 DIAGNOSIS — R5381 Other malaise: Secondary | ICD-10-CM

## 2011-06-19 DIAGNOSIS — R29898 Other symptoms and signs involving the musculoskeletal system: Secondary | ICD-10-CM

## 2011-06-19 DIAGNOSIS — I10 Essential (primary) hypertension: Secondary | ICD-10-CM

## 2011-06-19 DIAGNOSIS — E785 Hyperlipidemia, unspecified: Secondary | ICD-10-CM

## 2011-06-19 DIAGNOSIS — N259 Disorder resulting from impaired renal tubular function, unspecified: Secondary | ICD-10-CM

## 2011-06-19 DIAGNOSIS — M109 Gout, unspecified: Secondary | ICD-10-CM

## 2011-06-19 DIAGNOSIS — R5383 Other fatigue: Secondary | ICD-10-CM

## 2011-06-19 DIAGNOSIS — N281 Cyst of kidney, acquired: Secondary | ICD-10-CM

## 2011-06-19 HISTORY — DX: Cyst of kidney, acquired: N28.1

## 2011-06-19 LAB — HEPATIC FUNCTION PANEL
AST: 15 U/L (ref 0–37)
Albumin: 4 g/dL (ref 3.5–5.2)
Alkaline Phosphatase: 44 U/L (ref 39–117)
Bilirubin, Direct: 0.1 mg/dL (ref 0.0–0.3)
Total Bilirubin: 0.6 mg/dL (ref 0.3–1.2)

## 2011-06-19 LAB — TSH: TSH: 0.92 u[IU]/mL (ref 0.35–5.50)

## 2011-06-19 LAB — BASIC METABOLIC PANEL
CO2: 26 mEq/L (ref 19–32)
Chloride: 104 mEq/L (ref 96–112)
GFR: 19.11 mL/min — ABNORMAL LOW (ref >60.00)
Sodium: 138 mEq/L (ref 135–145)

## 2011-06-19 LAB — CBC WITH DIFFERENTIAL/PLATELET
Eosinophils Absolute: 0.2 10*3/uL (ref 0.0–0.7)
HCT: 34.5 % — ABNORMAL LOW (ref 39.0–52.0)
Hemoglobin: 11.3 g/dL — ABNORMAL LOW (ref 13.0–17.0)
Lymphocytes Relative: 19.2 % (ref 12.0–46.0)
Neutrophils Relative %: 63.8 % (ref 43.0–77.0)
RBC: 3.81 Mil/uL — ABNORMAL LOW (ref 4.22–5.81)
RDW: 18.2 % — ABNORMAL HIGH (ref 11.5–14.6)
WBC: 5.5 10*3/uL (ref 4.5–10.5)

## 2011-06-19 LAB — LIPID PANEL
Cholesterol: 164 mg/dL (ref 0–200)
HDL: 61.8 mg/dL (ref >39.00)
LDL Cholesterol: 80 mg/dL (ref 0–99)

## 2011-06-19 NOTE — Assessment & Plan Note (Signed)
Etiology unclear, Exam otherwise benign, to check labs as documented, follow with expectant management  

## 2011-06-19 NOTE — Assessment & Plan Note (Signed)
stable overall by hx and exam, most recent data reviewed with pt, and pt to continue medical treatment as before  Lab Results  Component Value Date   LDLCALC 81 06/03/2010

## 2011-06-19 NOTE — Assessment & Plan Note (Signed)
stable overall by hx and exam, most recent data reviewed with pt, and pt to continue medical treatment as before,to f/u renal as he does

## 2011-06-19 NOTE — Progress Notes (Signed)
  Subjective:    Patient ID: [name], male    DOB: Jun 20, 1929, 77 y.o.   MRN: [id number]  HPI  Here to f/u; overall doing ok,  Pt denies chest pain, increased sob or doe, wheezing, orthopnea, PND, increased LE swelling, palpitations, dizziness or syncope.  Pt denies new neurological symptoms such as new headache, or facial or extremity numbness but does have 4 wks onset new unusual LUE weakness with neck pain or radicular symptoms.  Pt denies polydipsia, polyuria.  Pt states overall good compliance with meds, trying to follow lower cholesterol  diet, wt overall stable but little exercise however recently.  Overall good compliance with treatment, and good medicine tolerability. Does have sense of ongoing fatigue, but denies signficant hypersomnolence. Denies hyper or hypo thyroid symptoms such as voice, skin or hair change. No recent overt bleeding.  Does take the ASA 81 mg daily. Past Medical History  Diagnosis Date  . ANEMIA-IRON DEFICIENCY 03/09/2008  . DIVERTICULOSIS, COLON 03/09/2008  . FATIGUE 03/09/2008  . GOUT 03/09/2008  . HYPERLIPIDEMIA 03/09/2008  . HYPERTENSION 03/09/2008  . PROSTATE CANCER, HX OF 03/09/2008  . RENAL INSUFFICIENCY 03/09/2008  . SHOULDER PAIN, LEFT 06/03/2010  . Complex renal cyst 06/19/2011   Past Surgical History  Procedure Date  . Tonsillectomy   . Rotator cuff repair right 4/08  . S/p left hip replacement 2007    Dr. Percell Miller ortho    reports that he has quit smoking. He does not have any smokeless tobacco history on file. His alcohol and drug histories not on file. family history includes Cancer in his father; Diabetes in his father; and Hypertension in his brother and father. No Known Allergies  Review of Systems Review of Systems  Constitutional: Negative for diaphoresis and unexpected weight change.  HENT: Negative for drooling and tinnitus.   Eyes: Negative for photophobia and visual disturbance.  Respiratory: Negative for choking and stridor.     Gastrointestinal: Negative for vomiting and blood in stool.  Genitourinary: Negative for hematuria and decreased urine volume.     Objective:   Physical Exam BP 130/62  Pulse 64  Temp(Src) 97.8 F (36.6 C) (Oral)  Ht 5\' 9"  (1.753 m)  Wt 167 lb 2 oz (75.807 kg)  BMI 24.68 kg/m2  SpO2 91% Physical Exam  VS noted Constitutional: Pt appears well-developed and well-nourished.  HENT: Head: Normocephalic.  Right Ear: External ear normal.  Left Ear: External ear normal.  Eyes: Conjunctivae and EOM are normal. Pupils are equal, round, and reactive to light.  Neck: Normal range of motion. Neck supple.  Cardiovascular: Normal rate and regular rhythm.   Pulmonary/Chest: Effort normal and breath sounds normal.  Abd:  Soft, NT, non-distended, + BS Neurological: Pt is alert. No cranial nerve deficit. motor/dtr/sens intact except for distal LUE 4+/5 weakness Skin: Skin is warm. No erythema.  Psychiatric: Pt behavior is normal. Thought content normal.  Spine nontender       Assessment & Plan:

## 2011-06-19 NOTE — Assessment & Plan Note (Signed)
Mild LUE distal in this right handed male, no HA but cannot r/o subacute infarct given the risk factors.  To cont the ASA81  For now, but will need head MRI

## 2011-06-19 NOTE — Assessment & Plan Note (Signed)
stable overall by hx and exam, most recent data reviewed with pt, and pt to continue medical treatment as before  . BP Readings from Last 3 Encounters:  06/19/11 130/62  06/03/10 110/52  06/02/09 146/82

## 2011-06-19 NOTE — Patient Instructions (Signed)
Continue all other medications as before Please go to LAB in the Basement for the blood and/or urine tests to be done today You will be contacted regarding the referral for: MRI for the brain Please call the phone number [phone] (the [place]) for results of testing in 2-3 days;  When calling, simply dial the number, and when prompted enter the MRN number above (the Medical Record Number) and the # key, then the message should start. Please keep your appointments with your specialists as you have planned - renal

## 2011-06-20 ENCOUNTER — Telehealth: Payer: Self-pay

## 2011-06-20 NOTE — Telephone Encounter (Signed)
Labs faxed

## 2011-06-26 ENCOUNTER — Ambulatory Visit
Admission: RE | Admit: 2011-06-26 | Discharge: 2011-06-26 | Disposition: A | Discharge status: Home / Self Care | Payer: Medicare Other | Source: Ambulatory Visit | Attending: Internal Medicine | Admitting: Internal Medicine

## 2011-06-26 DIAGNOSIS — R29898 Other symptoms and signs involving the musculoskeletal system: Secondary | ICD-10-CM

## 2011-08-18 ENCOUNTER — Telehealth: Payer: Self-pay | Admitting: *Deleted

## 2011-08-18 NOTE — Telephone Encounter (Signed)
.  Patient requesting a call to discuss [facility] letter.

## 2011-08-18 NOTE — Telephone Encounter (Signed)
Called patient informed of information.

## 2011-08-21 LAB — BASIC METABOLIC PANEL
BUN: 29 — ABNORMAL HIGH
CO2: 28
Calcium: 9.1
Chloride: 108
Creatinine, Ser: 2.3 — ABNORMAL HIGH
GFR calc Af Amer: 34 — ABNORMAL LOW
Glucose, Bld: 88
Sodium: 139

## 2011-09-14 ENCOUNTER — Other Ambulatory Visit: Payer: Self-pay | Admitting: Internal Medicine

## 2011-09-15 ENCOUNTER — Other Ambulatory Visit: Payer: Self-pay

## 2011-09-15 MED ORDER — AMLODIPINE BESYLATE 2.5 MG PO TABS: 2.5000 mg | ORAL_TABLET | Freq: Every day | ORAL | Status: DC

## 2011-09-15 MED ORDER — ATENOLOL 50 MG PO TABS: ORAL_TABLET | ORAL | Status: DC

## 2011-09-26 ENCOUNTER — Other Ambulatory Visit: Payer: Self-pay

## 2011-09-26 MED ORDER — ROSUVASTATIN CALCIUM 20 MG PO TABS: 20.0000 mg | ORAL_TABLET | Freq: Every day | ORAL | Status: DC

## 2011-11-14 DIAGNOSIS — N184 Chronic kidney disease, stage 4 (severe): Secondary | ICD-10-CM | POA: Diagnosis not present

## 2011-11-14 DIAGNOSIS — I10 Essential (primary) hypertension: Secondary | ICD-10-CM | POA: Diagnosis not present

## 2011-11-14 DIAGNOSIS — N039 Chronic nephritic syndrome with unspecified morphologic changes: Secondary | ICD-10-CM | POA: Diagnosis not present

## 2011-12-07 DIAGNOSIS — D631 Anemia in chronic kidney disease: Secondary | ICD-10-CM | POA: Diagnosis not present

## 2011-12-07 DIAGNOSIS — N184 Chronic kidney disease, stage 4 (severe): Secondary | ICD-10-CM | POA: Diagnosis not present

## 2011-12-18 ENCOUNTER — Other Ambulatory Visit: Payer: Self-pay

## 2011-12-18 MED ORDER — ALLOPURINOL 100 MG PO TABS: 100.0000 mg | ORAL_TABLET | Freq: Every day | ORAL | Status: DC

## 2011-12-20 ENCOUNTER — Ambulatory Visit (INDEPENDENT_AMBULATORY_CARE_PROVIDER_SITE_OTHER): Payer: Medicare Other | Admitting: Internal Medicine

## 2011-12-20 ENCOUNTER — Encounter: Payer: Self-pay | Admitting: Internal Medicine

## 2011-12-20 VITALS — BP 126/60 | HR 63 | Temp 98.3°F | Ht 69.0 in | Wt 165.5 lb

## 2011-12-20 DIAGNOSIS — E785 Hyperlipidemia, unspecified: Secondary | ICD-10-CM

## 2011-12-20 DIAGNOSIS — M109 Gout, unspecified: Secondary | ICD-10-CM

## 2011-12-20 DIAGNOSIS — N259 Disorder resulting from impaired renal tubular function, unspecified: Secondary | ICD-10-CM | POA: Diagnosis not present

## 2011-12-20 DIAGNOSIS — I10 Essential (primary) hypertension: Secondary | ICD-10-CM

## 2011-12-20 MED ORDER — ALLOPURINOL 100 MG PO TABS: 100.0000 mg | ORAL_TABLET | Freq: Every day | ORAL | Status: DC

## 2011-12-20 NOTE — Assessment & Plan Note (Addendum)
To cont reg f/u with nephrology as planned, declines further labs today

## 2011-12-20 NOTE — Assessment & Plan Note (Signed)
stable overall by hx and exam, most recent data reviewed with pt, and pt to continue medical treatment as before  Lab Results  Component Value Date   LDLCALC 80 06/19/2011

## 2011-12-20 NOTE — Progress Notes (Signed)
Subjective:    Patient ID: [name], male    DOB: Sep 26, 1929, 78 y.o.   MRN: [id number]  HPI  Here for f/u;  Overall doing ok;  Pt denies CP, worsening SOB, DOE, wheezing, orthopnea, PND, worsening LE edema, palpitations, dizziness or syncope.  Pt denies neurological change such as new Headache, facial or extremity weakness.  Pt denies polydipsia, polyuria, or low sugar symptoms. Pt states overall good compliance with treatment and medications, good tolerability, and trying to follow lower cholesterol diet.  Pt denies worsening depressive symptoms, suicidal ideation or panic. No fever, wt loss, night sweats, loss of appetite, or other constitutional symptoms.  Pt states good ability with ADL's, low fall risk, home safety reviewed and adequate, no significant changes in hearing or vision, and occasionally active with exercise.  Sees nephrology on regular basis, last cr stable at 3.13 Sep 2011, and also has monthly hgb with what sounds like epo with hgb less than 11.  Pt denies fever, wt loss, night sweats, loss of appetite, or other constitutional symptoms Past Medical History  Diagnosis Date  . ANEMIA-IRON DEFICIENCY 03/09/2008  . DIVERTICULOSIS, COLON 03/09/2008  . FATIGUE 03/09/2008  . GOUT 03/09/2008  . HYPERLIPIDEMIA 03/09/2008  . HYPERTENSION 03/09/2008  . PROSTATE CANCER, HX OF 03/09/2008  . RENAL INSUFFICIENCY 03/09/2008  . SHOULDER PAIN, LEFT 06/03/2010  . Complex renal cyst 06/19/2011   Past Surgical History  Procedure Date  . Tonsillectomy   . Rotator cuff repair right 4/08  . S/p left hip replacement 2007    Dr. Percell Miller ortho    reports that he has quit smoking. He does not have any smokeless tobacco history on file. His alcohol and drug histories not on file. family history includes Cancer in his father; Diabetes in his father; and Hypertension in his brother and father. No Known Allergies Current Outpatient Prescriptions on File Prior to Visit  Medication Sig Dispense Refill  .  amLODipine (NORVASC) 2.5 MG tablet Take 1 tablet (2.5 mg total) by mouth daily.  90 tablet  2  . aspirin 81 MG tablet Take 81 mg by mouth daily.        Marland Kitchen atenolol (TENORMIN) 50 MG tablet Take 1/2 tablet daily  45 tablet  2  . doxercalciferol (HECTOROL) 0.5 MCG capsule Take 1 mcg by mouth daily.        . rosuvastatin (CRESTOR) 20 MG tablet Take 1 tablet (20 mg total) by mouth daily.  90 tablet  3   Review of Systems Review of Systems  Constitutional: Negative for diaphoresis and unexpected weight change.  HENT: Negative for drooling and tinnitus.   Eyes: Negative for photophobia and visual disturbance.  Respiratory: Negative for choking and stridor.   Gastrointestinal: Negative for vomiting and blood in stool.  Genitourinary: Negative for hematuria and decreased urine volume.  Musculoskeletal: Negative for gait problem.  Skin: Negative for color change and wound.  Neurological: Negative for tremors and numbness.  Psychiatric/Behavioral: Negative for decreased concentration. The patient is not hyperactive.       Objective:   Physical Exam BP 126/60  Pulse 63  Temp(Src) 98.3 F (36.8 C) (Oral)  Ht 5\' 9"  (1.753 m)  Wt 165 lb 8 oz (75.07 kg)  BMI 24.44 kg/m2  SpO2 96% Physical Exam  VS noted Constitutional: Pt appears well-developed and well-nourished.  HENT: Head: Normocephalic.  Right Ear: External ear normal.  Left Ear: External ear normal.  Eyes: Conjunctivae and EOM are normal. Pupils are equal, round, and  reactive to light.  Neck: Normal range of motion. Neck supple.  Cardiovascular: Normal rate and regular rhythm.   Pulmonary/Chest: Effort normal and breath sounds normal.  Abd:  Soft, NT, non-distended, + BS Neurological: Pt is alert. No cranial nerve deficit.  Skin: Skin is warm. No erythema.  Psychiatric: Pt behavior is normal. Thought content normal.     Assessment & Plan:

## 2011-12-20 NOTE — Assessment & Plan Note (Signed)
stable overall by hx and exam, most recent data reviewed with pt, and pt to continue medical treatment as before  BP Readings from Last 3 Encounters:  12/20/11 126/60  06/19/11 130/62  06/03/10 110/52

## 2011-12-20 NOTE — Patient Instructions (Signed)
Continue all other medications as before No blood work or other needed today You are otherwise up to date with prevention Your allopurinol was refilled to [facility] Please keep your appointments with your specialists as you have planned - Renal, and urology Please return in 1 year for your yearly visit, or sooner if needed

## 2011-12-20 NOTE — Assessment & Plan Note (Signed)
stable overall by hx and exam, most recent data reviewed with pt, and pt to continue medical treatment as before, for allopurinol refill,  to f/u any worsening symptoms or concerns

## 2011-12-27 DIAGNOSIS — D509 Iron deficiency anemia, unspecified: Secondary | ICD-10-CM | POA: Diagnosis not present

## 2011-12-27 DIAGNOSIS — N184 Chronic kidney disease, stage 4 (severe): Secondary | ICD-10-CM | POA: Diagnosis not present

## 2011-12-27 DIAGNOSIS — I10 Essential (primary) hypertension: Secondary | ICD-10-CM | POA: Diagnosis not present

## 2011-12-27 DIAGNOSIS — N039 Chronic nephritic syndrome with unspecified morphologic changes: Secondary | ICD-10-CM | POA: Diagnosis not present

## 2012-01-16 DIAGNOSIS — D631 Anemia in chronic kidney disease: Secondary | ICD-10-CM | POA: Diagnosis not present

## 2012-01-16 DIAGNOSIS — D509 Iron deficiency anemia, unspecified: Secondary | ICD-10-CM | POA: Diagnosis not present

## 2012-01-16 DIAGNOSIS — N184 Chronic kidney disease, stage 4 (severe): Secondary | ICD-10-CM | POA: Diagnosis not present

## 2012-02-07 DIAGNOSIS — N039 Chronic nephritic syndrome with unspecified morphologic changes: Secondary | ICD-10-CM | POA: Diagnosis not present

## 2012-02-07 DIAGNOSIS — N184 Chronic kidney disease, stage 4 (severe): Secondary | ICD-10-CM | POA: Diagnosis not present

## 2012-02-07 DIAGNOSIS — D509 Iron deficiency anemia, unspecified: Secondary | ICD-10-CM | POA: Diagnosis not present

## 2012-02-07 DIAGNOSIS — D631 Anemia in chronic kidney disease: Secondary | ICD-10-CM | POA: Diagnosis not present

## 2012-02-27 DIAGNOSIS — N039 Chronic nephritic syndrome with unspecified morphologic changes: Secondary | ICD-10-CM | POA: Diagnosis not present

## 2012-02-27 DIAGNOSIS — D631 Anemia in chronic kidney disease: Secondary | ICD-10-CM | POA: Diagnosis not present

## 2012-02-27 DIAGNOSIS — N184 Chronic kidney disease, stage 4 (severe): Secondary | ICD-10-CM | POA: Diagnosis not present

## 2012-03-18 ENCOUNTER — Other Ambulatory Visit (HOSPITAL_COMMUNITY): Payer: Self-pay | Admitting: *Deleted

## 2012-03-19 ENCOUNTER — Encounter (HOSPITAL_COMMUNITY)
Admission: RE | Admit: 2012-03-19 | Discharge: 2012-03-19 | Disposition: A | Discharge status: Home / Self Care | Payer: Medicare Other | Source: Ambulatory Visit | Attending: Nephrology | Admitting: Nephrology

## 2012-03-19 DIAGNOSIS — D638 Anemia in other chronic diseases classified elsewhere: Secondary | ICD-10-CM | POA: Diagnosis not present

## 2012-03-19 DIAGNOSIS — N184 Chronic kidney disease, stage 4 (severe): Secondary | ICD-10-CM | POA: Diagnosis not present

## 2012-03-19 LAB — FERRITIN: Ferritin: 602 ng/mL — ABNORMAL HIGH (ref 22–322)

## 2012-03-19 LAB — IRON AND TIBC
Saturation Ratios: 37 % (ref 20–55)
TIBC: 267 ug/dL (ref 215–435)

## 2012-03-19 MED ORDER — EPOETIN ALFA 40000 UNIT/ML IJ SOLN: 25000.0000 [IU] | INTRAMUSCULAR | Status: DC

## 2012-03-25 LAB — POCT HEMOGLOBIN-HEMACUE: Hemoglobin: 11.6 g/dL — ABNORMAL LOW (ref 13.0–17.0)

## 2012-04-03 ENCOUNTER — Encounter (HOSPITAL_COMMUNITY)
Admission: RE | Admit: 2012-04-03 | Discharge: 2012-04-03 | Disposition: A | Discharge status: Home / Self Care | Payer: Medicare Other | Source: Ambulatory Visit | Attending: Nephrology | Admitting: Nephrology

## 2012-04-03 DIAGNOSIS — D638 Anemia in other chronic diseases classified elsewhere: Secondary | ICD-10-CM | POA: Diagnosis not present

## 2012-04-03 DIAGNOSIS — N184 Chronic kidney disease, stage 4 (severe): Secondary | ICD-10-CM | POA: Diagnosis not present

## 2012-04-03 LAB — POCT HEMOGLOBIN-HEMACUE: Hemoglobin: 10.6 g/dL — ABNORMAL LOW (ref 13.0–17.0)

## 2012-04-03 MED ORDER — EPOETIN ALFA 40000 UNIT/ML IJ SOLN: 25000.0000 [IU] | INTRAMUSCULAR | Status: DC

## 2012-04-03 MED ORDER — EPOETIN ALFA 10000 UNIT/ML IJ SOLN
INTRAMUSCULAR | Status: AC
  Administered 2012-04-03: 25000 [IU]
  Filled 2012-04-03: qty 1

## 2012-04-03 MED ORDER — EPOETIN ALFA 20000 UNIT/ML IJ SOLN
INTRAMUSCULAR | Status: AC
  Filled 2012-04-03: qty 1

## 2012-04-25 ENCOUNTER — Encounter (HOSPITAL_COMMUNITY)
Admission: RE | Admit: 2012-04-25 | Discharge: 2012-04-25 | Disposition: A | Discharge status: Home / Self Care | Payer: Medicare Other | Source: Ambulatory Visit | Attending: Nephrology | Admitting: Nephrology

## 2012-04-25 DIAGNOSIS — D638 Anemia in other chronic diseases classified elsewhere: Secondary | ICD-10-CM | POA: Insufficient documentation

## 2012-04-25 DIAGNOSIS — N184 Chronic kidney disease, stage 4 (severe): Secondary | ICD-10-CM | POA: Diagnosis not present

## 2012-04-25 MED ORDER — EPOETIN ALFA 40000 UNIT/ML IJ SOLN: 25000.0000 [IU] | INTRAMUSCULAR | Status: DC

## 2012-04-25 MED ORDER — EPOETIN ALFA 20000 UNIT/ML IJ SOLN
INTRAMUSCULAR | Status: AC
  Administered 2012-04-25: 20000 [IU] via SUBCUTANEOUS
  Filled 2012-04-25: qty 1

## 2012-04-25 MED ORDER — EPOETIN ALFA 10000 UNIT/ML IJ SOLN
INTRAMUSCULAR | Status: AC
  Administered 2012-04-25: 5000 [IU]
  Filled 2012-04-25: qty 1

## 2012-05-16 ENCOUNTER — Encounter (HOSPITAL_COMMUNITY)
Admission: RE | Admit: 2012-05-16 | Discharge: 2012-05-16 | Disposition: A | Discharge status: Home / Self Care | Payer: Medicare Other | Source: Ambulatory Visit | Attending: Nephrology | Admitting: Nephrology

## 2012-05-16 DIAGNOSIS — D638 Anemia in other chronic diseases classified elsewhere: Secondary | ICD-10-CM | POA: Insufficient documentation

## 2012-05-16 DIAGNOSIS — N184 Chronic kidney disease, stage 4 (severe): Secondary | ICD-10-CM | POA: Insufficient documentation

## 2012-05-16 LAB — POCT HEMOGLOBIN-HEMACUE: Hemoglobin: 11.7 g/dL — ABNORMAL LOW (ref 13.0–17.0)

## 2012-05-16 MED ORDER — EPOETIN ALFA 40000 UNIT/ML IJ SOLN: 25000.0000 [IU] | INTRAMUSCULAR | Status: DC

## 2012-05-30 ENCOUNTER — Encounter (HOSPITAL_COMMUNITY): Payer: Medicare Other

## 2012-06-06 DIAGNOSIS — M1710 Unilateral primary osteoarthritis, unspecified knee: Secondary | ICD-10-CM | POA: Diagnosis not present

## 2012-06-06 DIAGNOSIS — M76899 Other specified enthesopathies of unspecified lower limb, excluding foot: Secondary | ICD-10-CM | POA: Diagnosis not present

## 2012-06-10 DIAGNOSIS — H1130 Conjunctival hemorrhage, unspecified eye: Secondary | ICD-10-CM | POA: Diagnosis not present

## 2012-06-10 DIAGNOSIS — H5010 Unspecified exotropia: Secondary | ICD-10-CM | POA: Diagnosis not present

## 2012-06-10 DIAGNOSIS — H2510 Age-related nuclear cataract, unspecified eye: Secondary | ICD-10-CM | POA: Diagnosis not present

## 2012-06-12 ENCOUNTER — Other Ambulatory Visit (HOSPITAL_COMMUNITY): Payer: Self-pay | Admitting: *Deleted

## 2012-06-13 ENCOUNTER — Encounter (HOSPITAL_COMMUNITY)
Admission: RE | Admit: 2012-06-13 | Discharge: 2012-06-13 | Disposition: A | Discharge status: Home / Self Care | Payer: Medicare Other | Source: Ambulatory Visit | Attending: Nephrology | Admitting: Nephrology

## 2012-06-13 DIAGNOSIS — N184 Chronic kidney disease, stage 4 (severe): Secondary | ICD-10-CM | POA: Insufficient documentation

## 2012-06-13 DIAGNOSIS — D638 Anemia in other chronic diseases classified elsewhere: Secondary | ICD-10-CM | POA: Insufficient documentation

## 2012-06-13 LAB — IRON AND TIBC
Saturation Ratios: 49 % (ref 20–55)
UIBC: 125 ug/dL (ref 125–400)

## 2012-06-13 LAB — POCT HEMOGLOBIN-HEMACUE: Hemoglobin: 10.9 g/dL — ABNORMAL LOW (ref 13.0–17.0)

## 2012-06-13 MED ORDER — EPOETIN ALFA 10000 UNIT/ML IJ SOLN
INTRAMUSCULAR | Status: AC
  Administered 2012-06-13: 25000 [IU] via SUBCUTANEOUS
  Filled 2012-06-13: qty 1

## 2012-06-13 MED ORDER — EPOETIN ALFA 20000 UNIT/ML IJ SOLN
INTRAMUSCULAR | Status: AC
  Filled 2012-06-13: qty 1

## 2012-06-13 MED ORDER — EPOETIN ALFA 40000 UNIT/ML IJ SOLN: 25000.0000 [IU] | INTRAMUSCULAR | Status: DC

## 2012-07-03 ENCOUNTER — Other Ambulatory Visit: Payer: Self-pay | Admitting: Internal Medicine

## 2012-07-03 ENCOUNTER — Telehealth: Payer: Self-pay | Admitting: Internal Medicine

## 2012-07-03 MED ORDER — ATENOLOL 50 MG PO TABS: ORAL_TABLET | ORAL | Status: DC

## 2012-07-03 MED ORDER — AMLODIPINE BESYLATE 2.5 MG PO TABS: 2.5000 mg | ORAL_TABLET | Freq: Every day | ORAL | Status: DC

## 2012-07-03 NOTE — Telephone Encounter (Signed)
Pt needs refills on Atenolol 50mg  and  Amlodipine 2.5mg , please send to [facility], 90day supply, pt states pharmacy has sent on request and was told by his pharmacy to contact us ph#[phone]. Pt request a call once sent to pharmacy to make him aware

## 2012-07-11 ENCOUNTER — Encounter (HOSPITAL_COMMUNITY): Payer: Medicare Other

## 2012-07-11 ENCOUNTER — Other Ambulatory Visit (HOSPITAL_COMMUNITY): Payer: Self-pay | Admitting: *Deleted

## 2012-07-12 ENCOUNTER — Encounter (HOSPITAL_COMMUNITY)
Admission: RE | Admit: 2012-07-12 | Discharge: 2012-07-12 | Disposition: A | Discharge status: Home / Self Care | Payer: Medicare Other | Source: Ambulatory Visit | Attending: Nephrology | Admitting: Nephrology

## 2012-07-12 DIAGNOSIS — N184 Chronic kidney disease, stage 4 (severe): Secondary | ICD-10-CM | POA: Diagnosis not present

## 2012-07-12 DIAGNOSIS — D638 Anemia in other chronic diseases classified elsewhere: Secondary | ICD-10-CM | POA: Insufficient documentation

## 2012-07-12 LAB — IRON AND TIBC: TIBC: 257 ug/dL (ref 215–435)

## 2012-07-12 MED ORDER — EPOETIN ALFA 40000 UNIT/ML IJ SOLN: 25000.0000 [IU] | INTRAMUSCULAR | Status: DC

## 2012-07-25 ENCOUNTER — Other Ambulatory Visit (HOSPITAL_COMMUNITY): Payer: Self-pay | Admitting: *Deleted

## 2012-07-25 ENCOUNTER — Telehealth: Payer: Self-pay | Admitting: Internal Medicine

## 2012-07-25 NOTE — Telephone Encounter (Signed)
Caller: Fanny/Spouse; Patient Name: [name], [name]; PCP: [name] (Adults only); Best Callback Phone Number: [phone]; Call regarding Dizziness with Syncopal episodes, onset 9-13.  Blood pressure has been normal per Wife.  All emergent symptoms ruled out per Dizziness Protocol, see in 24 hours due to dizziness not responding to home care.  No appointments on 9-19, advised Wife to call back on 9-20 for same day appointment time with Dr Jenny Reichmann.  Wife verbalized understanding.

## 2012-07-26 ENCOUNTER — Ambulatory Visit (INDEPENDENT_AMBULATORY_CARE_PROVIDER_SITE_OTHER): Payer: Medicare Other | Admitting: Internal Medicine

## 2012-07-26 ENCOUNTER — Other Ambulatory Visit (INDEPENDENT_AMBULATORY_CARE_PROVIDER_SITE_OTHER): Payer: Medicare Other

## 2012-07-26 ENCOUNTER — Encounter (HOSPITAL_COMMUNITY)
Admission: RE | Admit: 2012-07-26 | Discharge: 2012-07-26 | Disposition: A | Discharge status: Home / Self Care | Payer: Medicare Other | Source: Ambulatory Visit | Attending: Nephrology | Admitting: Nephrology

## 2012-07-26 ENCOUNTER — Encounter: Payer: Self-pay | Admitting: Internal Medicine

## 2012-07-26 VITALS — BP 132/78 | HR 64 | Temp 97.3°F | Ht 69.0 in | Wt 159.0 lb

## 2012-07-26 DIAGNOSIS — E785 Hyperlipidemia, unspecified: Secondary | ICD-10-CM

## 2012-07-26 DIAGNOSIS — R42 Dizziness and giddiness: Secondary | ICD-10-CM | POA: Diagnosis not present

## 2012-07-26 DIAGNOSIS — I10 Essential (primary) hypertension: Secondary | ICD-10-CM | POA: Diagnosis not present

## 2012-07-26 LAB — TSH: TSH: 0.83 u[IU]/mL (ref 0.35–5.50)

## 2012-07-26 LAB — HEPATIC FUNCTION PANEL
Albumin: 4.1 g/dL (ref 3.5–5.2)
Alkaline Phosphatase: 49 U/L (ref 39–117)

## 2012-07-26 LAB — CBC WITH DIFFERENTIAL/PLATELET
Basophils Absolute: 0 10*3/uL (ref 0.0–0.1)
Basophils Relative: 0.6 % (ref 0.0–3.0)
Eosinophils Absolute: 0.2 10*3/uL (ref 0.0–0.7)
HCT: 33.9 % — ABNORMAL LOW (ref 39.0–52.0)
Hemoglobin: 10.9 g/dL — ABNORMAL LOW (ref 13.0–17.0)
Monocytes Absolute: 0.8 10*3/uL (ref 0.1–1.0)
Monocytes Relative: 14.7 % — ABNORMAL HIGH (ref 3.0–12.0)
RDW: 17.4 % — ABNORMAL HIGH (ref 11.5–14.6)
WBC: 5.6 10*3/uL (ref 4.5–10.5)

## 2012-07-26 LAB — LIPID PANEL
Cholesterol: 184 mg/dL (ref 0–200)
LDL Cholesterol: 97 mg/dL (ref 0–99)
Triglycerides: 141 mg/dL (ref 0.0–149.0)

## 2012-07-26 LAB — BASIC METABOLIC PANEL
Calcium: 9.7 mg/dL (ref 8.4–10.5)
GFR: 17.8 mL/min — ABNORMAL LOW (ref >60.00)
Glucose, Bld: 103 mg/dL — ABNORMAL HIGH (ref 70–99)
Sodium: 141 mEq/L (ref 135–145)

## 2012-07-26 MED ORDER — EPOETIN ALFA 20000 UNIT/ML IJ SOLN
INTRAMUSCULAR | Status: AC
  Filled 2012-07-26: qty 1

## 2012-07-26 MED ORDER — EPOETIN ALFA 20000 UNIT/ML IJ SOLN
15000.0000 [IU] | INTRAMUSCULAR | Status: DC
  Administered 2012-07-26: 20000 [IU] via SUBCUTANEOUS

## 2012-07-26 MED ORDER — EPOETIN ALFA 10000 UNIT/ML IJ SOLN
INTRAMUSCULAR | Status: AC
  Administered 2012-07-26: 5000 [IU] via SUBCUTANEOUS
  Filled 2012-07-26: qty 1

## 2012-07-26 NOTE — Patient Instructions (Addendum)
Please wait 30 sec to 1 min to get up from lying or standing to avoid dizziness Continue all other medications as before Please keep your appointments with your specialists as you have planned - Renal Please go to LAB in the Basement for the blood and/or urine tests to be done today You will be contacted by phone if any changes need to be made immediately.  Otherwise, you will receive a letter about your results with an explanation. Please remember to sign up for My Chart at your earliest convenience, as this will be important to you in the future with finding out test results.

## 2012-07-27 ENCOUNTER — Encounter: Payer: Self-pay | Admitting: Internal Medicine

## 2012-07-27 DIAGNOSIS — R42 Dizziness and giddiness: Secondary | ICD-10-CM | POA: Insufficient documentation

## 2012-07-27 NOTE — Assessment & Plan Note (Signed)
stable overall by hx and exam, most recent data reviewed with pt, and pt to continue medical treatment as before BP Readings from Last 3 Encounters:  07/26/12 132/78  07/26/12 157/61  07/12/12 132/69

## 2012-07-27 NOTE — Assessment & Plan Note (Signed)
Mild symptomatic, not orthostatic, no other acute illness found today, asked to to get up slowly both first in the AM as well as with bending,  to f/u any worsening symptoms or concerns

## 2012-07-27 NOTE — Assessment & Plan Note (Signed)
stable overall by hx and exam, most recent data reviewed with pt, and pt to continue medical treatment as before Lab Results  Component Value Date   LDLCALC 97 07/26/2012

## 2012-07-27 NOTE — Progress Notes (Signed)
Subjective:    Patient ID: [name], male    DOB: 1929/04/21, 78 y.o.   MRN: [id number]  HPI  Pt here with CC of lightheadedness for 1 wk after bending at the waist, then standing back up straight again.  Mild and also same with getting up in the AM, Pt denies chest pain, increased sob or doe, wheezing, orthopnea, PND, increased LE swelling, palpitations,  or syncope.  Pt denies new neurological symptoms such as new headache, or facial or extremity weakness or numbness   Pt denies polydipsia, polyuria.  Pt denies fever, wt loss, night sweats, loss of appetite, or other constitutional symptoms Past Medical History  Diagnosis Date  . ANEMIA-IRON DEFICIENCY 03/09/2008  . DIVERTICULOSIS, COLON 03/09/2008  . FATIGUE 03/09/2008  . GOUT 03/09/2008  . HYPERLIPIDEMIA 03/09/2008  . HYPERTENSION 03/09/2008  . PROSTATE CANCER, HX OF 03/09/2008  . RENAL INSUFFICIENCY 03/09/2008  . SHOULDER PAIN, LEFT 06/03/2010  . Complex renal cyst 06/19/2011   Past Surgical History  Procedure Date  . Tonsillectomy   . Rotator cuff repair right 4/08  . S/p left hip replacement 2007    Dr. Percell Miller ortho    reports that he has quit smoking. He does not have any smokeless tobacco history on file. His alcohol and drug histories not on file. family history includes Cancer in his father; Diabetes in his father; and Hypertension in his brother and father. No Known Allergies Current Outpatient Prescriptions on File Prior to Visit  Medication Sig Dispense Refill  . allopurinol (ZYLOPRIM) 100 MG tablet Take 1 tablet (100 mg total) by mouth daily.  90 tablet  3  . amLODipine (NORVASC) 2.5 MG tablet Take 1 tablet (2.5 mg total) by mouth daily.  90 tablet  1  . amLODipine (NORVASC) 2.5 MG tablet TAKE 1 TABLET DAILY  90 tablet  1  . aspirin 81 MG tablet Take 81 mg by mouth daily.        Marland Kitchen atenolol (TENORMIN) 50 MG tablet Take 1/2 tablet daily  45 tablet  1  . doxercalciferol (HECTOROL) 0.5 MCG capsule Take 1 mcg by mouth daily.          . rosuvastatin (CRESTOR) 20 MG tablet Take 1 tablet (20 mg total) by mouth daily.  90 tablet  3   Current Facility-Administered Medications on File Prior to Visit  Medication Dose Route Frequency Provider Last Rate Last Dose  . epoetin alfa (EPOGEN,PROCRIT) 09811 UNIT/ML injection           . DISCONTD: epoetin alfa (EPOGEN,PROCRIT) injection 15,000 Units  15,000 Units Subcutaneous Q28 days Placido Sou, MD   20,000 Units at 07/26/12 1054   Review of Systems  Constitutional: Negative for diaphoresis and unexpected weight change.  HENT: Negative for tinnitus.   Eyes: Negative for photophobia and visual disturbance.  Respiratory: Negative for choking and stridor.   Gastrointestinal: Negative for vomiting and blood in stool.  Genitourinary: Negative for hematuria and decreased urine volume.  Musculoskeletal: Negative for gait problem.  Skin: Negative for color change and wound.  Neurological: Negative for tremors and numbness.  Psychiatric/Behavioral: Negative for decreased concentration. The patient is not hyperactive.       Objective:   Physical Exam BP 132/78  Pulse 64  Temp 97.3 F (36.3 C) (Oral)  Ht 5\' 9"  (1.753 m)  Wt 159 lb (72.122 kg)  BMI 23.48 kg/m2  SpO2 98% Physical Exam  VS noted Constitutional: Pt appears well-developed and well-nourished.  HENT: Head: Normocephalic.  Right Ear: External ear normal.  Left Ear: External ear normal.  Eyes: Conjunctivae and EOM are normal. Pupils are equal, round, and reactive to light.  Neck: Normal range of motion. Neck supple.  Cardiovascular: Normal rate and regular rhythm.   Pulmonary/Chest: Effort normal and breath sounds normal.  Abd:  Soft, NT, non-distended, + BS Neurological: Pt is alert. Not confused , motor/gait/dtr/intact Skin: Skin is warm. No erythema.  Psychiatric: Pt behavior is normal. Thought content normal.     Assessment & Plan:

## 2012-07-31 ENCOUNTER — Other Ambulatory Visit: Payer: Self-pay | Admitting: Nephrology

## 2012-07-31 DIAGNOSIS — N184 Chronic kidney disease, stage 4 (severe): Secondary | ICD-10-CM

## 2012-08-01 ENCOUNTER — Ambulatory Visit
Admission: RE | Admit: 2012-08-01 | Discharge: 2012-08-01 | Disposition: A | Discharge status: Home / Self Care | Payer: Medicare Other | Source: Ambulatory Visit | Attending: Nephrology | Admitting: Nephrology

## 2012-08-01 DIAGNOSIS — N184 Chronic kidney disease, stage 4 (severe): Secondary | ICD-10-CM

## 2012-08-01 DIAGNOSIS — N189 Chronic kidney disease, unspecified: Secondary | ICD-10-CM | POA: Diagnosis not present

## 2012-08-02 DIAGNOSIS — N184 Chronic kidney disease, stage 4 (severe): Secondary | ICD-10-CM | POA: Diagnosis not present

## 2012-08-19 ENCOUNTER — Other Ambulatory Visit (HOSPITAL_COMMUNITY): Payer: Self-pay | Admitting: *Deleted

## 2012-08-19 DIAGNOSIS — I129 Hypertensive chronic kidney disease with stage 1 through stage 4 chronic kidney disease, or unspecified chronic kidney disease: Secondary | ICD-10-CM | POA: Diagnosis not present

## 2012-08-19 DIAGNOSIS — M109 Gout, unspecified: Secondary | ICD-10-CM | POA: Diagnosis not present

## 2012-08-19 DIAGNOSIS — N2581 Secondary hyperparathyroidism of renal origin: Secondary | ICD-10-CM | POA: Diagnosis not present

## 2012-08-19 DIAGNOSIS — D649 Anemia, unspecified: Secondary | ICD-10-CM | POA: Diagnosis not present

## 2012-08-19 DIAGNOSIS — I10 Essential (primary) hypertension: Secondary | ICD-10-CM | POA: Diagnosis not present

## 2012-08-19 DIAGNOSIS — N184 Chronic kidney disease, stage 4 (severe): Secondary | ICD-10-CM | POA: Diagnosis not present

## 2012-08-19 DIAGNOSIS — E213 Hyperparathyroidism, unspecified: Secondary | ICD-10-CM | POA: Diagnosis not present

## 2012-08-22 ENCOUNTER — Other Ambulatory Visit: Payer: Self-pay

## 2012-08-22 DIAGNOSIS — N184 Chronic kidney disease, stage 4 (severe): Secondary | ICD-10-CM

## 2012-08-22 DIAGNOSIS — Z01810 Encounter for preprocedural cardiovascular examination: Secondary | ICD-10-CM

## 2012-08-23 ENCOUNTER — Encounter (HOSPITAL_COMMUNITY): Payer: Medicare Other

## 2012-08-28 ENCOUNTER — Encounter (HOSPITAL_COMMUNITY)
Admission: RE | Admit: 2012-08-28 | Discharge: 2012-08-28 | Disposition: A | Discharge status: Home / Self Care | Payer: Medicare Other | Source: Ambulatory Visit | Attending: Nephrology | Admitting: Nephrology

## 2012-08-28 DIAGNOSIS — N184 Chronic kidney disease, stage 4 (severe): Secondary | ICD-10-CM | POA: Diagnosis not present

## 2012-08-28 DIAGNOSIS — D638 Anemia in other chronic diseases classified elsewhere: Secondary | ICD-10-CM | POA: Diagnosis not present

## 2012-08-28 LAB — POCT HEMOGLOBIN-HEMACUE: Hemoglobin: 10.6 g/dL — ABNORMAL LOW (ref 13.0–17.0)

## 2012-08-28 LAB — IRON AND TIBC
Saturation Ratios: 36 % (ref 20–55)
TIBC: 256 ug/dL (ref 215–435)
UIBC: 164 ug/dL (ref 125–400)

## 2012-08-28 MED ORDER — EPOETIN ALFA 20000 UNIT/ML IJ SOLN
INTRAMUSCULAR | Status: AC
  Administered 2012-08-28: 20000 [IU] via SUBCUTANEOUS
  Filled 2012-08-28: qty 1

## 2012-08-28 MED ORDER — EPOETIN ALFA 40000 UNIT/ML IJ SOLN: 25000.0000 [IU] | INTRAMUSCULAR | Status: DC

## 2012-08-28 MED ORDER — EPOETIN ALFA 10000 UNIT/ML IJ SOLN
INTRAMUSCULAR | Status: AC
  Administered 2012-08-28: 5000 [IU] via SUBCUTANEOUS
  Filled 2012-08-28: qty 1

## 2012-09-16 ENCOUNTER — Encounter: Payer: Self-pay | Admitting: Vascular Surgery

## 2012-09-17 ENCOUNTER — Encounter: Payer: Self-pay | Admitting: Vascular Surgery

## 2012-09-17 ENCOUNTER — Encounter (INDEPENDENT_AMBULATORY_CARE_PROVIDER_SITE_OTHER): Payer: Medicare Other | Admitting: *Deleted

## 2012-09-17 ENCOUNTER — Ambulatory Visit (INDEPENDENT_AMBULATORY_CARE_PROVIDER_SITE_OTHER): Payer: Medicare Other | Admitting: Vascular Surgery

## 2012-09-17 VITALS — BP 170/79 | HR 59 | Resp 20 | Ht 69.0 in | Wt 159.0 lb

## 2012-09-17 DIAGNOSIS — N186 End stage renal disease: Secondary | ICD-10-CM

## 2012-09-17 DIAGNOSIS — N184 Chronic kidney disease, stage 4 (severe): Secondary | ICD-10-CM

## 2012-09-17 DIAGNOSIS — Z01810 Encounter for preprocedural cardiovascular examination: Secondary | ICD-10-CM

## 2012-09-17 NOTE — Progress Notes (Signed)
V[facility] and [facility]in Specialist of [facility]   Patient name: [name] MRN: [id number] DOB: Dec 26, 1928 Sex: male   Referred by: Deterding  Reason for referral:  Chief Complaint  Patient presents with  . New Evaluation    NEW EVAL FOR ACCESS    HISTORY OF PRESENT ILLNESS: Patient is a very pleasant 78 year old gentleman with progressive renal insufficiency. His creatinine is in the mid 4 range. He has never been on hemodialysis. He is here today with his wife for further discussion. He does report difficulty with IV access with blood draws. He is quite active at his age of 68.  Past Medical History  Diagnosis Date  . ANEMIA-IRON DEFICIENCY 03/09/2008  . DIVERTICULOSIS, COLON 03/09/2008  . FATIGUE 03/09/2008  . GOUT 03/09/2008  . HYPERLIPIDEMIA 03/09/2008  . HYPERTENSION 03/09/2008  . PROSTATE CANCER, HX OF 03/09/2008  . RENAL INSUFFICIENCY 03/09/2008  . SHOULDER PAIN, LEFT 06/03/2010  . Complex renal cyst 06/19/2011  . Hyperparathyroidism     Past Surgical History  Procedure Date  . Tonsillectomy   . Rotator cuff repair right 4/08  . S/p left hip replacement 2007    Dr. Percell Miller ortho  . Joint replacement     HIP    History   Social History  . Marital Status: Married    Spouse Name: N/A    Number of Children: 2  . Years of Education: N/A   Occupational History  . retired Print production planner    Social History Main Topics  . Smoking status: Former Smoker -- 5 years    Types: Cigarettes    Quit date: 09/17/1982  . Smokeless tobacco: Never Used  . Alcohol Use: No  . Drug Use: No  . Sexually Active: Not on file   Other Topics Concern  . Not on file   Social History Narrative  . No narrative on file    Family History  Problem Relation Age of Onset  . Hypertension Father   . Diabetes Father   . Cancer Father     prostate cancer  . Hypertension Brother     Allergies as of 09/17/2012  . (No Known Allergies)    Current Outpatient Prescriptions on File Prior to  Visit  Medication Sig Dispense Refill  . allopurinol (ZYLOPRIM) 100 MG tablet Take 1 tablet (100 mg total) by mouth daily.  90 tablet  3  . amLODipine (NORVASC) 2.5 MG tablet TAKE 1 TABLET DAILY  90 tablet  1  . aspirin 81 MG tablet Take 81 mg by mouth daily.        Marland Kitchen atenolol (TENORMIN) 50 MG tablet Take 1/2 tablet daily  45 tablet  1  . doxercalciferol (HECTOROL) 0.5 MCG capsule Take 1 mcg by mouth daily.        . rosuvastatin (CRESTOR) 20 MG tablet Take 1 tablet (20 mg total) by mouth daily.  90 tablet  3  . amLODipine (NORVASC) 2.5 MG tablet Take 1 tablet (2.5 mg total) by mouth daily.  90 tablet  1     REVIEW OF SYSTEMS:  Positives indicated with an "X"  CARDIOVASCULAR:  [ ]  chest pain   [ ]  chest pressure   [ ]  palpitations   [ ]  orthopnea   [ ]  dyspnea on exertion   [ ]  claudication   [ ]  rest pain   [ ]  DVT   [ ]  phlebitis PULMONARY:   [ ]  productive cough   [ ]  asthma   [ ]   wheezing NEUROLOGIC:   [ ]  weakness  [ ]  paresthesias  [ ]  aphasia  [ ]  amaurosis  [x ] dizziness HEMATOLOGIC:   [ ]  bleeding problems   [ ]  clotting disorders MUSCULOSKELETAL:  [ ]  joint pain   [ ]  joint swelling GASTROINTESTINAL: [ ]   blood in stool  [ ]   hematemesis GENITOURINARY:  [ ]   dysuria  [ ]   hematuria PSYCHIATRIC:  [ ]  history of major depression INTEGUMENTARY:  [ ]  rashes  [ ]  ulcers CONSTITUTIONAL:  [ ]  fever   [ ]  chills  PHYSICAL EXAMINATION:  General: The patient is a well-nourished male, in no acute distress. Vital signs are BP 170/79  Pulse 59  Resp 20  Ht 5\' 9"  (1.753 m)  Wt 159 lb (72.122 kg)  BMI 23.48 kg/m2 Pulmonary: There is a good air exchange bilaterally  Abdomen: Soft and non-tender  Musculoskeletal: There are no major deformities.  There is no significant extremity pain. Neurologic: No focal weakness or paresthesias are detected, Skin: There are no ulcer or rashes noted. Psychiatric: The patient has normal affect. Cardiovascular: 2+ radial pulses bilaterally   VVS  Vascular Lab Studies:  Ordered and Independently Reviewed bilateral upper extremity venous vein mapping reveals nonvisualization of the cephalic vein on the left entire arm with very small at the antecubital space. On the right he has a small vein less than 2 mm throughout its course. The basilic vein was also small.  Impression and Plan:  Had a long discussion with the patient and his wife present. I explained options for hemodialysis catheter, hemodialysis AV fistula an AV graft. I imaged his surface veins with the SonoSite ultrasound myself. This does show extremely small surface veins in both the cephalic and basilic system bilaterally. I do not feel he is a candidate for AV fistula attempt. I did do recommend placement of left arm graft when the time is appropriate. He understands this is an outpatient procedure. We will defer timing of this to Dr. Jimmy Footman. I explained to the patient and the need for approximately 3-4 weeks lead time after placement prior to initiation of hemodialysis the new graft. We will not schedule a routine followup appointment with them but will be available time I comes for AV graft placement on the left    Aziah Kaiser [facility] of [facility] Office: [phone]

## 2012-09-25 ENCOUNTER — Encounter (HOSPITAL_COMMUNITY)
Admission: RE | Admit: 2012-09-25 | Discharge: 2012-09-25 | Disposition: A | Discharge status: Home / Self Care | Payer: Medicare Other | Source: Ambulatory Visit | Attending: Nephrology | Admitting: Nephrology

## 2012-09-25 DIAGNOSIS — N184 Chronic kidney disease, stage 4 (severe): Secondary | ICD-10-CM | POA: Diagnosis not present

## 2012-09-25 DIAGNOSIS — D638 Anemia in other chronic diseases classified elsewhere: Secondary | ICD-10-CM | POA: Diagnosis not present

## 2012-09-25 DIAGNOSIS — C61 Malignant neoplasm of prostate: Secondary | ICD-10-CM | POA: Diagnosis not present

## 2012-09-25 LAB — IRON AND TIBC
Iron: 93 ug/dL (ref 42–135)
TIBC: 283 ug/dL (ref 215–435)

## 2012-09-25 LAB — POCT HEMOGLOBIN-HEMACUE: Hemoglobin: 11.1 g/dL — ABNORMAL LOW (ref 13.0–17.0)

## 2012-09-25 MED ORDER — EPOETIN ALFA 40000 UNIT/ML IJ SOLN: 25000.0000 [IU] | INTRAMUSCULAR | Status: DC

## 2012-09-25 MED ORDER — EPOETIN ALFA 20000 UNIT/ML IJ SOLN
INTRAMUSCULAR | Status: AC
  Administered 2012-09-25: 20000 [IU]
  Filled 2012-09-25: qty 1

## 2012-09-25 MED ORDER — EPOETIN ALFA 10000 UNIT/ML IJ SOLN
INTRAMUSCULAR | Status: AC
  Administered 2012-09-25: 5000 [IU]
  Filled 2012-09-25: qty 1

## 2012-10-02 DIAGNOSIS — N3941 Urge incontinence: Secondary | ICD-10-CM | POA: Diagnosis not present

## 2012-10-02 DIAGNOSIS — C61 Malignant neoplasm of prostate: Secondary | ICD-10-CM | POA: Diagnosis not present

## 2012-10-23 ENCOUNTER — Encounter (HOSPITAL_COMMUNITY)
Admission: RE | Admit: 2012-10-23 | Discharge: 2012-10-23 | Disposition: A | Discharge status: Home / Self Care | Payer: Medicare Other | Source: Ambulatory Visit | Attending: Nephrology | Admitting: Nephrology

## 2012-10-23 DIAGNOSIS — N184 Chronic kidney disease, stage 4 (severe): Secondary | ICD-10-CM | POA: Diagnosis not present

## 2012-10-23 DIAGNOSIS — D638 Anemia in other chronic diseases classified elsewhere: Secondary | ICD-10-CM | POA: Insufficient documentation

## 2012-10-23 LAB — IRON AND TIBC: UIBC: 148 ug/dL (ref 125–400)

## 2012-10-23 LAB — POCT HEMOGLOBIN-HEMACUE: Hemoglobin: 11.6 g/dL — ABNORMAL LOW (ref 13.0–17.0)

## 2012-10-23 MED ORDER — EPOETIN ALFA 20000 UNIT/ML IJ SOLN
INTRAMUSCULAR | Status: AC
  Administered 2012-10-23: 20000 [IU]
  Filled 2012-10-23: qty 1

## 2012-10-23 MED ORDER — EPOETIN ALFA 40000 UNIT/ML IJ SOLN: 25000.0000 [IU] | INTRAMUSCULAR | Status: DC

## 2012-10-23 MED ORDER — EPOETIN ALFA 10000 UNIT/ML IJ SOLN
INTRAMUSCULAR | Status: AC
  Administered 2012-10-23: 5000 [IU]
  Filled 2012-10-23: qty 1

## 2012-11-11 ENCOUNTER — Telehealth: Payer: Self-pay | Admitting: Internal Medicine

## 2012-11-11 MED ORDER — ROSUVASTATIN CALCIUM 20 MG PO TABS: 20.0000 mg | ORAL_TABLET | Freq: Every day | ORAL | Status: DC

## 2012-11-11 NOTE — Addendum Note (Signed)
Addended by: Sharon Seller B on: 11/11/2012 03:23 PM   Modules accepted: Orders

## 2012-11-11 NOTE — Telephone Encounter (Signed)
Patient needs a refill on his Crestor sent to [facility]

## 2012-11-12 ENCOUNTER — Telehealth: Payer: Self-pay

## 2012-11-12 MED ORDER — ROSUVASTATIN CALCIUM 20 MG PO TABS: 20.0000 mg | ORAL_TABLET | Freq: Every day | ORAL | Status: DC

## 2012-11-12 NOTE — Telephone Encounter (Signed)
rx filled

## 2012-11-18 ENCOUNTER — Other Ambulatory Visit (HOSPITAL_COMMUNITY): Payer: Self-pay | Admitting: *Deleted

## 2012-11-20 ENCOUNTER — Encounter (HOSPITAL_COMMUNITY)
Admission: RE | Admit: 2012-11-20 | Discharge: 2012-11-20 | Disposition: A | Discharge status: Home / Self Care | Payer: Medicare Other | Source: Ambulatory Visit | Attending: Nephrology | Admitting: Nephrology

## 2012-11-20 DIAGNOSIS — D638 Anemia in other chronic diseases classified elsewhere: Secondary | ICD-10-CM | POA: Diagnosis not present

## 2012-11-20 DIAGNOSIS — N184 Chronic kidney disease, stage 4 (severe): Secondary | ICD-10-CM | POA: Diagnosis not present

## 2012-11-20 LAB — IRON AND TIBC
Iron: 98 ug/dL (ref 42–135)
Saturation Ratios: 35 % (ref 20–55)
TIBC: 279 ug/dL (ref 215–435)

## 2012-11-20 MED ORDER — EPOETIN ALFA 20000 UNIT/ML IJ SOLN: 15000.0000 [IU] | INTRAMUSCULAR | Status: DC

## 2012-12-04 ENCOUNTER — Encounter (HOSPITAL_COMMUNITY): Payer: Medicare Other

## 2012-12-18 ENCOUNTER — Encounter (HOSPITAL_COMMUNITY)
Admission: RE | Admit: 2012-12-18 | Discharge: 2012-12-18 | Disposition: A | Discharge status: Home / Self Care | Payer: Medicare Other | Source: Ambulatory Visit | Attending: Nephrology | Admitting: Nephrology

## 2012-12-18 DIAGNOSIS — N184 Chronic kidney disease, stage 4 (severe): Secondary | ICD-10-CM | POA: Insufficient documentation

## 2012-12-18 DIAGNOSIS — D638 Anemia in other chronic diseases classified elsewhere: Secondary | ICD-10-CM | POA: Diagnosis not present

## 2012-12-18 LAB — IRON AND TIBC: Saturation Ratios: 48 % (ref 20–55)

## 2012-12-18 MED ORDER — EPOETIN ALFA 20000 UNIT/ML IJ SOLN
15000.0000 [IU] | INTRAMUSCULAR | Status: DC
  Administered 2012-12-18: 15000 [IU] via SUBCUTANEOUS

## 2012-12-31 DIAGNOSIS — C61 Malignant neoplasm of prostate: Secondary | ICD-10-CM | POA: Diagnosis not present

## 2013-01-14 ENCOUNTER — Other Ambulatory Visit (HOSPITAL_COMMUNITY): Payer: Self-pay | Admitting: *Deleted

## 2013-01-14 ENCOUNTER — Telehealth: Payer: Self-pay | Admitting: Internal Medicine

## 2013-01-14 DIAGNOSIS — M109 Gout, unspecified: Secondary | ICD-10-CM | POA: Diagnosis not present

## 2013-01-14 DIAGNOSIS — I129 Hypertensive chronic kidney disease with stage 1 through stage 4 chronic kidney disease, or unspecified chronic kidney disease: Secondary | ICD-10-CM | POA: Diagnosis not present

## 2013-01-14 DIAGNOSIS — N184 Chronic kidney disease, stage 4 (severe): Secondary | ICD-10-CM | POA: Diagnosis not present

## 2013-01-14 DIAGNOSIS — I10 Essential (primary) hypertension: Secondary | ICD-10-CM | POA: Diagnosis not present

## 2013-01-14 MED ORDER — ATENOLOL 50 MG PO TABS: ORAL_TABLET | ORAL | Status: DC

## 2013-01-14 MED ORDER — ALLOPURINOL 100 MG PO TABS: 100.0000 mg | ORAL_TABLET | Freq: Every day | ORAL | Status: DC

## 2013-01-14 NOTE — Telephone Encounter (Signed)
Requesting refills on allopurinol and atenolol.  He uses [facility] order.

## 2013-01-15 ENCOUNTER — Encounter (HOSPITAL_COMMUNITY)
Admission: RE | Admit: 2013-01-15 | Discharge: 2013-01-15 | Disposition: A | Discharge status: Home / Self Care | Payer: Medicare Other | Source: Ambulatory Visit | Attending: Nephrology | Admitting: Nephrology

## 2013-01-15 DIAGNOSIS — N184 Chronic kidney disease, stage 4 (severe): Secondary | ICD-10-CM | POA: Diagnosis not present

## 2013-01-15 DIAGNOSIS — D638 Anemia in other chronic diseases classified elsewhere: Secondary | ICD-10-CM | POA: Insufficient documentation

## 2013-01-15 LAB — IRON AND TIBC
Iron: 124 ug/dL (ref 42–135)
TIBC: 278 ug/dL (ref 215–435)
UIBC: 154 ug/dL (ref 125–400)

## 2013-01-15 MED ORDER — EPOETIN ALFA 10000 UNIT/ML IJ SOLN
10000.0000 [IU] | INTRAMUSCULAR | Status: DC
  Administered 2013-01-15: 10000 [IU] via SUBCUTANEOUS

## 2013-01-15 MED ORDER — EPOETIN ALFA 10000 UNIT/ML IJ SOLN
INTRAMUSCULAR | Status: AC
  Filled 2013-01-15: qty 1

## 2013-01-16 ENCOUNTER — Encounter (HOSPITAL_COMMUNITY): Payer: Medicare Other

## 2013-01-17 ENCOUNTER — Other Ambulatory Visit: Payer: Self-pay | Admitting: *Deleted

## 2013-01-18 ENCOUNTER — Other Ambulatory Visit: Payer: Self-pay | Admitting: Internal Medicine

## 2013-01-20 ENCOUNTER — Telehealth: Payer: Self-pay | Admitting: Internal Medicine

## 2013-01-20 NOTE — Telephone Encounter (Signed)
Pt is calling about his refill for Allopurinol, EPIC chart shows this was sent to [facility] in [facility] 01/18/13 and Attenolol and chart shows this was sent to [facility] 01/14/13.  He states they will not fill these medications, but he does not know why. He is completely out of both medications and will need about 5 days worth called into a local pharmacy.. [facility] # is [phone] and he says his membership # is [id number].  Please call and find out what they may need from your office.  Please call pt to advise where his medications were called in until he gets his full RX from [facility].  TY!

## 2013-01-21 ENCOUNTER — Telehealth: Payer: Self-pay

## 2013-01-21 ENCOUNTER — Other Ambulatory Visit: Payer: Self-pay

## 2013-01-21 MED ORDER — ALLOPURINOL 100 MG PO TABS: 100.0000 mg | ORAL_TABLET | Freq: Every day | ORAL | Status: DC

## 2013-01-21 MED ORDER — ATENOLOL 50 MG PO TABS: 25.0000 mg | ORAL_TABLET | Freq: Every day | ORAL | Status: DC

## 2013-01-21 NOTE — Telephone Encounter (Signed)
Patients wife called requesting refill on atenolol and allopurinol as pt. Has been out.  Sent #90 to [facility] as requested and a #30 day to [facility] for the patient for mailorder to arrive.  The wife was upset as prescriptions had been requested but not filled.  Took care of both today from mail order and local pharmacy.

## 2013-01-22 ENCOUNTER — Encounter (HOSPITAL_COMMUNITY): Payer: Self-pay | Admitting: *Deleted

## 2013-01-23 MED ORDER — SODIUM CHLORIDE 0.9 % IV SOLN
INTRAVENOUS | Status: DC
  Administered 2013-01-24: 13:00:00 via INTRAVENOUS

## 2013-01-23 MED ORDER — DEXTROSE 5 % IV SOLN
1.5000 g | INTRAVENOUS | Status: AC
  Administered 2013-01-24: 1.5 g via INTRAVENOUS
  Filled 2013-01-23: qty 1.5

## 2013-01-24 ENCOUNTER — Ambulatory Visit (HOSPITAL_COMMUNITY)
Admission: RE | Admit: 2013-01-24 | Discharge: 2013-01-24 | Disposition: A | Discharge status: Home / Self Care | Payer: Medicare Other | Source: Ambulatory Visit | Attending: Vascular Surgery | Admitting: Vascular Surgery

## 2013-01-24 ENCOUNTER — Encounter (HOSPITAL_COMMUNITY): Payer: Self-pay | Admitting: *Deleted

## 2013-01-24 ENCOUNTER — Ambulatory Visit (HOSPITAL_COMMUNITY): Payer: Medicare Other | Admitting: Anesthesiology

## 2013-01-24 ENCOUNTER — Ambulatory Visit (HOSPITAL_COMMUNITY): Payer: Medicare Other

## 2013-01-24 ENCOUNTER — Encounter (HOSPITAL_COMMUNITY): Payer: Self-pay | Admitting: Anesthesiology

## 2013-01-24 ENCOUNTER — Encounter (HOSPITAL_COMMUNITY)
Admission: RE | Disposition: A | Discharge status: Home / Self Care | Payer: Self-pay | Source: Ambulatory Visit | Attending: Vascular Surgery

## 2013-01-24 DIAGNOSIS — Z7982 Long term (current) use of aspirin: Secondary | ICD-10-CM | POA: Diagnosis not present

## 2013-01-24 DIAGNOSIS — R011 Cardiac murmur, unspecified: Secondary | ICD-10-CM | POA: Diagnosis not present

## 2013-01-24 DIAGNOSIS — E785 Hyperlipidemia, unspecified: Secondary | ICD-10-CM | POA: Insufficient documentation

## 2013-01-24 DIAGNOSIS — N186 End stage renal disease: Secondary | ICD-10-CM

## 2013-01-24 DIAGNOSIS — E213 Hyperparathyroidism, unspecified: Secondary | ICD-10-CM | POA: Insufficient documentation

## 2013-01-24 DIAGNOSIS — I120 Hypertensive chronic kidney disease with stage 5 chronic kidney disease or end stage renal disease: Secondary | ICD-10-CM | POA: Diagnosis not present

## 2013-01-24 DIAGNOSIS — M109 Gout, unspecified: Secondary | ICD-10-CM | POA: Diagnosis not present

## 2013-01-24 DIAGNOSIS — D509 Iron deficiency anemia, unspecified: Secondary | ICD-10-CM | POA: Insufficient documentation

## 2013-01-24 DIAGNOSIS — Z01818 Encounter for other preprocedural examination: Secondary | ICD-10-CM | POA: Diagnosis not present

## 2013-01-24 DIAGNOSIS — Z87891 Personal history of nicotine dependence: Secondary | ICD-10-CM | POA: Insufficient documentation

## 2013-01-24 DIAGNOSIS — Z79899 Other long term (current) drug therapy: Secondary | ICD-10-CM | POA: Insufficient documentation

## 2013-01-24 DIAGNOSIS — M1990 Unspecified osteoarthritis, unspecified site: Secondary | ICD-10-CM | POA: Insufficient documentation

## 2013-01-24 DIAGNOSIS — Z8546 Personal history of malignant neoplasm of prostate: Secondary | ICD-10-CM | POA: Diagnosis not present

## 2013-01-24 HISTORY — PX: AV FISTULA PLACEMENT: SHX1204

## 2013-01-24 HISTORY — DX: Unspecified osteoarthritis, unspecified site: M19.90

## 2013-01-24 HISTORY — DX: Personal history of other medical treatment: Z92.89

## 2013-01-24 LAB — POCT I-STAT 4, (NA,K, GLUC, HGB,HCT)
HCT: 36 % — ABNORMAL LOW (ref 39.0–52.0)
Hemoglobin: 12.2 g/dL — ABNORMAL LOW (ref 13.0–17.0)
Potassium: 5.4 mEq/L — ABNORMAL HIGH (ref 3.5–5.1)
Sodium: 141 mEq/L (ref 135–145)

## 2013-01-24 LAB — SURGICAL PCR SCREEN: Staphylococcus aureus: NEGATIVE

## 2013-01-24 SURGERY — INSERTION OF ARTERIOVENOUS (AV) GORE-TEX GRAFT ARM
Anesthesia: Monitor Anesthesia Care | Site: Arm Lower | Laterality: Left | Wound class: Clean

## 2013-01-24 MED ORDER — MIDAZOLAM HCL 5 MG/5ML IJ SOLN
INTRAMUSCULAR | Status: DC | PRN
  Administered 2013-01-24: 1 mg via INTRAVENOUS

## 2013-01-24 MED ORDER — LIDOCAINE-EPINEPHRINE 0.5 %-1:200000 IJ SOLN
INTRAMUSCULAR | Status: AC
  Filled 2013-01-24: qty 1

## 2013-01-24 MED ORDER — MUPIROCIN 2 % EX OINT
TOPICAL_OINTMENT | CUTANEOUS | Status: AC
  Filled 2013-01-24: qty 22

## 2013-01-24 MED ORDER — ONDANSETRON HCL 4 MG/2ML IJ SOLN: 4.0000 mg | Freq: Once | INTRAMUSCULAR | Status: DC | PRN

## 2013-01-24 MED ORDER — SODIUM CHLORIDE 0.9 % IR SOLN
Status: DC | PRN
  Administered 2013-01-24: 15:00:00

## 2013-01-24 MED ORDER — PROPOFOL INFUSION 10 MG/ML OPTIME
INTRAVENOUS | Status: DC | PRN
  Administered 2013-01-24: 50 ug/kg/min via INTRAVENOUS

## 2013-01-24 MED ORDER — HYDROMORPHONE HCL PF 1 MG/ML IJ SOLN: 0.2500 mg | INTRAMUSCULAR | Status: DC | PRN

## 2013-01-24 MED ORDER — OXYCODONE HCL 5 MG PO TABS: 5.0000 mg | ORAL_TABLET | ORAL | Status: DC | PRN

## 2013-01-24 MED ORDER — LIDOCAINE-EPINEPHRINE 0.5 %-1:200000 IJ SOLN
INTRAMUSCULAR | Status: DC | PRN
  Administered 2013-01-24: 12 mL

## 2013-01-24 MED ORDER — SODIUM CHLORIDE 0.9 % IV SOLN
INTRAVENOUS | Status: DC | PRN
  Administered 2013-01-24: 13:00:00 via INTRAVENOUS

## 2013-01-24 MED ORDER — 0.9 % SODIUM CHLORIDE (POUR BTL) OPTIME
TOPICAL | Status: DC | PRN
  Administered 2013-01-24: 1000 mL

## 2013-01-24 MED ORDER — ONDANSETRON HCL 4 MG/2ML IJ SOLN
INTRAMUSCULAR | Status: DC | PRN
  Administered 2013-01-24: 4 mg via INTRAVENOUS

## 2013-01-24 SURGICAL SUPPLY — 38 items
BENZOIN TINCTURE PRP APPL 2/3 (GAUZE/BANDAGES/DRESSINGS) ×2 IMPLANT
CANISTER SUCTION 2500CC (MISCELLANEOUS) ×2 IMPLANT
CLIP LIGATING EXTRA MED SLVR (CLIP) ×2 IMPLANT
CLIP LIGATING EXTRA SM BLUE (MISCELLANEOUS) ×2 IMPLANT
CLOTH BEACON ORANGE TIMEOUT ST (SAFETY) ×2 IMPLANT
CLSR STERI-STRIP ANTIMIC 1/2X4 (GAUZE/BANDAGES/DRESSINGS) ×2 IMPLANT
COVER PROBE W GEL 5X96 (DRAPES) ×2 IMPLANT
COVER SURGICAL LIGHT HANDLE (MISCELLANEOUS) ×2 IMPLANT
DECANTER SPIKE VIAL GLASS SM (MISCELLANEOUS) IMPLANT
ELECT REM PT RETURN 9FT ADLT (ELECTROSURGICAL) ×2
ELECTRODE REM PT RTRN 9FT ADLT (ELECTROSURGICAL) ×1 IMPLANT
GEL ULTRASOUND 20GR AQUASONIC (MISCELLANEOUS) IMPLANT
GLOVE BIO SURGEON STRL SZ 6.5 (GLOVE) ×4 IMPLANT
GLOVE BIOGEL PI IND STRL 6.5 (GLOVE) ×1 IMPLANT
GLOVE BIOGEL PI IND STRL 7.0 (GLOVE) ×1 IMPLANT
GLOVE BIOGEL PI INDICATOR 6.5 (GLOVE) ×1
GLOVE BIOGEL PI INDICATOR 7.0 (GLOVE) ×1
GLOVE ECLIPSE 7.0 STRL STRAW (GLOVE) ×4 IMPLANT
GLOVE SS BIOGEL STRL SZ 7.5 (GLOVE) ×1 IMPLANT
GLOVE SUPERSENSE BIOGEL SZ 7.5 (GLOVE) ×1
GOWN STRL NON-REIN LRG LVL3 (GOWN DISPOSABLE) ×6 IMPLANT
GRAFT GORETEX STRT 6X50 (Vascular Products) ×2 IMPLANT
KIT BASIN OR (CUSTOM PROCEDURE TRAY) ×2 IMPLANT
KIT ROOM TURNOVER OR (KITS) ×2 IMPLANT
NS IRRIG 1000ML POUR BTL (IV SOLUTION) ×2 IMPLANT
PACK CV ACCESS (CUSTOM PROCEDURE TRAY) ×2 IMPLANT
PAD ARMBOARD 7.5X6 YLW CONV (MISCELLANEOUS) ×4 IMPLANT
SPONGE GAUZE 4X4 12PLY (GAUZE/BANDAGES/DRESSINGS) ×2 IMPLANT
STRIP CLOSURE SKIN 1/2X4 (GAUZE/BANDAGES/DRESSINGS) ×2 IMPLANT
SUT PROLENE 6 0 CC (SUTURE) ×6 IMPLANT
SUT SILK 2 0 FS (SUTURE) IMPLANT
SUT VIC AB 3-0 SH 27 (SUTURE) ×2
SUT VIC AB 3-0 SH 27X BRD (SUTURE) ×2 IMPLANT
TAPE CLOTH SURG 4X10 WHT LF (GAUZE/BANDAGES/DRESSINGS) ×2 IMPLANT
TOWEL OR 17X24 6PK STRL BLUE (TOWEL DISPOSABLE) ×2 IMPLANT
TOWEL OR 17X26 10 PK STRL BLUE (TOWEL DISPOSABLE) ×2 IMPLANT
UNDERPAD 30X30 INCONTINENT (UNDERPADS AND DIAPERS) ×2 IMPLANT
WATER STERILE IRR 1000ML POUR (IV SOLUTION) ×2 IMPLANT

## 2013-01-24 NOTE — Anesthesia Postprocedure Evaluation (Signed)
Anesthesia Post Note  Patient: [name]  Procedure(s) Performed: Procedure(s) (LRB): INSERTION OF ARTERIOVENOUS (AV) GORE-TEX GRAFT ARM (Left)  Anesthesia type: MAC  Patient location: PACU  Post pain: Pain level controlled  Post assessment: Post-op Vital signs reviewed  Last Vitals: BP 136/60  Pulse 58  Temp(Src) 36 C (Oral)  Resp 17  SpO2 98%  Post vital signs: Reviewed  Level of consciousness: awake  Complications: No apparent anesthesia complications

## 2013-01-24 NOTE — H&P (Signed)
[name]  09/17/2012 1:45 PM   Office Visit  MRN:  [id number]   Description: 79 year old male  Provider: Rosetta Posner, MD  Department: [facility]        Diagnoses    End stage renal disease    -  Primary    585.6      Reason for Visit    New Evaluation    NEW EVAL FOR ACCESS        Current Vitals - Last Recorded    BP Pulse Resp Ht Wt BMI    170/79 59 20 5\' 9"  (1.753 m) 159 lb (72.122 kg) 23.47 kg/m2       Vitals History Recorded       Progress Notes    Gabriel Posner, MD at 09/17/2012  2:31 PM    Status: Signed                   Vascular and Vein Specialist of [facility]     Patient name: [name]          MRN: [id number]        DOB: Dec 12, 1928          Sex: male     Referred by: [name]   Reason for referral:  Chief Complaint   Patient presents with   .  New Evaluation       NEW EVAL FOR ACCESS        HISTORY OF PRESENT ILLNESS: Patient is a very pleasant 79 year old gentleman with progressive renal insufficiency. His creatinine is in the mid 4 range. He has never been on hemodialysis. He is here today with his wife for further discussion. He does report difficulty with IV access with blood draws. He is quite active at his age of 79.    Past Medical History   Diagnosis  Date   .  ANEMIA-IRON DEFICIENCY  03/09/2008   .  DIVERTICULOSIS, COLON  03/09/2008   .  FATIGUE  03/09/2008   .  GOUT  03/09/2008   .  HYPERLIPIDEMIA  03/09/2008   .  HYPERTENSION  03/09/2008   .  PROSTATE CANCER, HX OF  03/09/2008   .  RENAL INSUFFICIENCY  03/09/2008   .  SHOULDER Wise, LEFT  06/03/2010   .  Complex renal cyst  06/19/2011   .  Hyperparathyroidism           Past Surgical History   Procedure  Date   .  Tonsillectomy     .  Rotator cuff repair right  4/08   .  S/p left hip replacement  2007       Dr. Percell Miller ortho   .  Joint replacement         HIP         History       Social History   .  Marital Status:  Married       Spouse Name:  N/A       Number of Children:  2   .  Years of Education:  N/A       Occupational History   .  retired Print production planner         Social History Main Topics   .  Smoking status:  Former Smoker -- 5 years       Types:  Cigarettes       Quit date:  09/17/1982   .  Smokeless tobacco:  Never Used   .  Alcohol Use:  No   .  Drug Use:  No   .  Sexually Active:  Not on file       Other Topics  Concern   .  Not on file       Social History Narrative   .  No narrative on file         Family History   Problem  Relation  Age of Onset   .  Hypertension  Father     .  Diabetes  Father     .  Cancer  Father         prostate cancer   .  Hypertension  Brother           Allergies as of 09/17/2012   .  (No Known Allergies)         Current Outpatient Prescriptions on File Prior to Visit   Medication  Sig  Dispense  Refill   .  allopurinol (ZYLOPRIM) 100 MG tablet  Take 1 tablet (100 mg total) by mouth daily.   90 tablet   3   .  amLODipine (NORVASC) 2.5 MG tablet  TAKE 1 TABLET DAILY   90 tablet   1   .  aspirin 81 MG tablet  Take 81 mg by mouth daily.           Marland Kitchen  atenolol (TENORMIN) 50 MG tablet  Take 1/2 tablet daily   45 tablet   1   .  doxercalciferol (HECTOROL) 0.5 MCG capsule  Take 1 mcg by mouth daily.           .  rosuvastatin (CRESTOR) 20 MG tablet  Take 1 tablet (20 mg total) by mouth daily.   90 tablet   3   .  amLODipine (NORVASC) 2.5 MG tablet  Take 1 tablet (2.5 mg total) by mouth daily.   90 tablet   1          REVIEW OF SYSTEMS:   Positives indicated with an "X"   CARDIOVASCULAR:  [ ]  chest Wise   [ ]  chest pressure   [ ]  palpitations   [ ]  orthopnea               [ ]  dyspnea on exertion   [ ]  claudication   [ ]  rest Wise   [ ]  DVT   [ ]  phlebitis PULMONARY:   [ ]  productive cough   [ ]  asthma   [ ]  wheezing NEUROLOGIC:   [ ]  weakness  [ ]  paresthesias  [ ]  aphasia  [ ]  amaurosis  [x ] dizziness HEMATOLOGIC:   [ ]  bleeding problems   [ ]  clotting  disorders MUSCULOSKELETAL:  [ ]  joint Wise   [ ]  joint swelling GASTROINTESTINAL: [ ]   blood in stool  [ ]   hematemesis GENITOURINARY:  [ ]   dysuria  [ ]   hematuria PSYCHIATRIC:  [ ]  history of major depression INTEGUMENTARY:  [ ]  rashes  [ ]  ulcers CONSTITUTIONAL:  [ ]  fever   [ ]  chills   PHYSICAL EXAMINATION:   General: The patient is a well-nourished male, in no acute distress. Vital signs are BP 170/79  Pulse 59  Resp 20  Ht 5\' 9"  (1.753 m)  Wt 159 lb (72.122 kg)  BMI 23.48 kg/m2 Pulmonary: There is a good air exchange bilaterally   Abdomen: Soft and non-tender   Musculoskeletal: There are no major deformities.  There is no significant extremity Wise. Neurologic: No focal weakness or paresthesias are detected, Skin: There are no ulcer or rashes noted. Psychiatric: The patient has normal affect. Cardiovascular: 2+ radial pulses bilaterally     [facility] Vascular Lab Studies:   Ordered and Independently Reviewed bilateral upper extremity venous vein mapping reveals nonvisualization of the cephalic vein on the left entire arm with very small at the antecubital space. On the right he has a small vein less than 2 mm throughout its course. The basilic vein was also small.   Impression and Plan:   Had a long discussion with the patient and his wife present. I explained options for hemodialysis catheter, hemodialysis AV fistula an AV graft. I imaged his surface veins with the SonoSite ultrasound myself. This does show extremely small surface veins in both the cephalic and basilic system bilaterally. I do not feel he is a candidate for AV fistula attempt. I did do recommend placement of left arm graft when the time is appropriate. He understands this is an outpatient procedure. We will defer timing of this to Dr. Jimmy Wise. I explained to the patient and the need for approximately 3-4 weeks lead time after placement prior to initiation of hemodialysis the new graft. We will not schedule  a routine followup appointment with them but will be available time I comes for AV graft placement on the left       [name] [facility] of [employer] Office: [phone]    Addendum:  The patient has been re-examined and re-evaluated.  The patient's history and physical has been reviewed and is unchanged.    [name] is a 79 y.o. male is being admitted with ESRD. All the risks, benefits and other treatment options have been discussed with the patient. The patient has consented to proceed with Procedure(s): INSERTION OF ARTERIOVENOUS (AV) GORE-TEX GRAFT ARM as a surgical intervention.  Liisa Picone 01/24/2013 6:50 AM Vascular and Vein Surgery

## 2013-01-24 NOTE — Transfer of Care (Signed)
Immediate Anesthesia Transfer of Care Note  Patient: [name]  Procedure(s) Performed: Procedure(s): INSERTION OF ARTERIOVENOUS (AV) GORE-TEX GRAFT ARM (Left)  Patient Location: PACU  Anesthesia Type:MAC  Level of Consciousness: awake  Airway & Oxygen Therapy: Patient Spontanous Breathing and Patient connected to face mask oxygen  Post-op Assessment: Report given to PACU RN, Post -op Vital signs reviewed and stable and Patient moving all extremities  Post vital signs: Reviewed and stable  Complications: No apparent anesthesia complications

## 2013-01-24 NOTE — Anesthesia Procedure Notes (Signed)
Procedure Name: MAC Date/Time: 01/24/2013 1:54 PM Performed by: Jenne Campus Pre-anesthesia Checklist: Patient identified, Emergency Drugs available, Suction available, Patient being monitored and Timeout performed Patient Re-evaluated:Patient Re-evaluated prior to inductionOxygen Delivery Method: Simple face mask

## 2013-01-24 NOTE — Preoperative (Signed)
Beta Blockers   Reason not to administer Beta Blockers:Not Applicable 

## 2013-01-24 NOTE — Anesthesia Preprocedure Evaluation (Addendum)
Anesthesia Evaluation  Patient identified by MRN, date of birth, ID band Patient awake    Reviewed: Allergy & Precautions, H&P , NPO status , Patient's Chart, lab work & pertinent test results, reviewed documented beta blocker date and time   History of Anesthesia Complications Negative for: history of anesthetic complications  Airway Mallampati: I TM Distance: >3 FB Neck ROM: Limited    Dental  (+) Dental Advisory Given, Edentulous Upper and Edentulous Lower   Pulmonary former smoker,  breath sounds clear to auscultation        Cardiovascular hypertension, Pt. on medications and Pt. on home beta blockers Rhythm:Regular Rate:Normal + Systolic murmurs    Neuro/Psych negative neurological ROS  negative psych ROS   GI/Hepatic negative GI ROS, Neg liver ROS,   Endo/Other  negative endocrine ROS  Renal/GU ESRF and CRFRenal diseasePatient has not started HD yet.     Musculoskeletal  (+) Arthritis -, Osteoarthritis,    Abdominal (+) - obese,   Peds  Hematology  (+) Blood dyscrasia, anemia ,   Anesthesia Other Findings   Reproductive/Obstetrics                       Anesthesia Physical Anesthesia Plan  ASA: III  Anesthesia Plan: MAC   Post-op Pain Management:    Induction: Intravenous  Airway Management Planned: Simple Face Mask and Nasal Cannula  Additional Equipment:   Intra-op Plan:   Post-operative Plan:   Informed Consent: I have reviewed the patients History and Physical, chart, labs and discussed the procedure including the risks, benefits and alternatives for the proposed anesthesia with the patient or authorized representative who has indicated his/her understanding and acceptance.   Dental advisory given  Plan Discussed with: CRNA  Anesthesia Plan Comments:         Anesthesia Quick Evaluation

## 2013-01-24 NOTE — Progress Notes (Signed)
Drinking ginger ale denies pain

## 2013-01-24 NOTE — Op Note (Signed)
OPERATIVE REPORT  DATE OF SURGERY: 01/24/2013  PATIENT: [name], 79 y.o. male MRN: [id number]  DOB: 08-04-1929  PRE-OPERATIVE DIAGNOSIS: Renal insufficiency  POST-OPERATIVE DIAGNOSIS:  Same  PROCEDURE: Left forearm loop AV Gore-Tex graft  SURGEON:  Curt Jews, M.D.   ASSISTANT: Nurse  ANESTHESIA:  Local with sedation  EBL: Minimal ml  Total I/O In: 200 [I.V.:200] Out: -   BLOOD ADMINISTERED: None  DRAINS: None  SPECIMEN: None  COUNTS CORRECT:  YES  PLAN OF CARE: PACU   PATIENT DISPOSITION:  PACU - hemodynamically stable  PROCEDURE DETAILS: Patient taken up in place supervision an area of the left arm was prepped in the sterile fashion. Using local anesthesia incision was made over the brachial pulse just above the elbow. The patient had a very large caliber brachial artery. There was a large calibered brachial vein alongside the artery. Separate incision was made over the distal forearm in a loop configuration tunnel was created. A 6 mm standard wall stretch Gore-Tex graft was brought through the tunnel. The brachial vein was occluded proximal and distally was opened with an 11 blade and sent longitudinally with Potts scissors. The graft spatulated and sewn end-to-side to the vein with a running 6-0 Prolene suture. The graft was flushed with heparinized and reoccluded. Next the artery was likewise occluded proximal and distally was opened 11 blade and symmetry Potts scissors. The graft was cut to appropriate length and spatulated and sewn end-to-side to the artery with a running 6-0 Prolene suture. Clamps removed and excellent thrill was noted. The wounds were irrigated with saline. Hemostasis obtained with cautery. Wounds were closed with 3-0 Vicryl in the subcutaneous and subcuticular tissue. Sterile dressing was applied. The patient was transferred to the recovery room in stable condition   Curt Jews, M.D. 01/24/2013 4:25 PM

## 2013-01-27 ENCOUNTER — Telehealth: Payer: Self-pay

## 2013-01-27 ENCOUNTER — Encounter (HOSPITAL_COMMUNITY): Payer: Self-pay | Admitting: Vascular Surgery

## 2013-01-27 MED FILL — Mupirocin Oint 2%: CUTANEOUS | Qty: 22 | Status: AC

## 2013-01-27 NOTE — Telephone Encounter (Signed)
Wife called to report pt. Having swelling in left forearm up to the elbow area.  Denies any redness at surgical site.  States area feels a little warm.  States the [place] strips are in place.  Also states pt's. pain seems to be managed with his pain medication.   Encouraged to have pt. Elevate arm on pillows to position the arm above the level of the heart.  Also advised to have pt. do gentle range of motion with the fingers of left hand.  Informed wife that the swelling is not unusual.  Reviewed s/s of infection, and encouraged to call office if symptoms worsen.  Verb. Understanding.

## 2013-01-31 ENCOUNTER — Encounter (HOSPITAL_COMMUNITY): Payer: Self-pay

## 2013-01-31 ENCOUNTER — Telehealth: Payer: Self-pay

## 2013-01-31 ENCOUNTER — Emergency Department (HOSPITAL_COMMUNITY)
Admission: EM | Admit: 2013-01-31 | Discharge: 2013-01-31 | Disposition: A | Discharge status: Home / Self Care | Payer: Medicare Other | Attending: Emergency Medicine | Admitting: Emergency Medicine

## 2013-01-31 DIAGNOSIS — Z8719 Personal history of other diseases of the digestive system: Secondary | ICD-10-CM | POA: Diagnosis not present

## 2013-01-31 DIAGNOSIS — E785 Hyperlipidemia, unspecified: Secondary | ICD-10-CM | POA: Insufficient documentation

## 2013-01-31 DIAGNOSIS — Z862 Personal history of diseases of the blood and blood-forming organs and certain disorders involving the immune mechanism: Secondary | ICD-10-CM | POA: Insufficient documentation

## 2013-01-31 DIAGNOSIS — E213 Hyperparathyroidism, unspecified: Secondary | ICD-10-CM | POA: Diagnosis not present

## 2013-01-31 DIAGNOSIS — I129 Hypertensive chronic kidney disease with stage 1 through stage 4 chronic kidney disease, or unspecified chronic kidney disease: Secondary | ICD-10-CM | POA: Insufficient documentation

## 2013-01-31 DIAGNOSIS — L259 Unspecified contact dermatitis, unspecified cause: Secondary | ICD-10-CM | POA: Diagnosis not present

## 2013-01-31 DIAGNOSIS — Z87891 Personal history of nicotine dependence: Secondary | ICD-10-CM | POA: Diagnosis not present

## 2013-01-31 DIAGNOSIS — Z7982 Long term (current) use of aspirin: Secondary | ICD-10-CM | POA: Insufficient documentation

## 2013-01-31 DIAGNOSIS — M79609 Pain in unspecified limb: Secondary | ICD-10-CM | POA: Diagnosis not present

## 2013-01-31 DIAGNOSIS — Z992 Dependence on renal dialysis: Secondary | ICD-10-CM | POA: Diagnosis not present

## 2013-01-31 DIAGNOSIS — Z79899 Other long term (current) drug therapy: Secondary | ICD-10-CM | POA: Insufficient documentation

## 2013-01-31 DIAGNOSIS — Z8739 Personal history of other diseases of the musculoskeletal system and connective tissue: Secondary | ICD-10-CM | POA: Diagnosis not present

## 2013-01-31 DIAGNOSIS — G8918 Other acute postprocedural pain: Secondary | ICD-10-CM | POA: Diagnosis not present

## 2013-01-31 DIAGNOSIS — Z8639 Personal history of other endocrine, nutritional and metabolic disease: Secondary | ICD-10-CM | POA: Insufficient documentation

## 2013-01-31 DIAGNOSIS — Z8546 Personal history of malignant neoplasm of prostate: Secondary | ICD-10-CM | POA: Insufficient documentation

## 2013-01-31 DIAGNOSIS — N289 Disorder of kidney and ureter, unspecified: Secondary | ICD-10-CM | POA: Diagnosis not present

## 2013-01-31 DIAGNOSIS — L0390 Cellulitis, unspecified: Secondary | ICD-10-CM | POA: Diagnosis not present

## 2013-01-31 LAB — POCT I-STAT, CHEM 8
Calcium, Ion: 1.24 mmol/L (ref 1.13–1.30)
Chloride: 110 mEq/L (ref 96–112)
Glucose, Bld: 111 mg/dL — ABNORMAL HIGH (ref 70–99)
HCT: 30 % — ABNORMAL LOW (ref 39.0–52.0)
TCO2: 22 mmol/L (ref 0–100)

## 2013-01-31 LAB — CBC WITH DIFFERENTIAL/PLATELET
Basophils Relative: 1 % (ref 0–1)
Eosinophils Absolute: 0.1 10*3/uL (ref 0.0–0.7)
Eosinophils Relative: 2 % (ref 0–5)
HCT: 30.4 % — ABNORMAL LOW (ref 39.0–52.0)
Hemoglobin: 9.7 g/dL — ABNORMAL LOW (ref 13.0–17.0)
MCH: 29 pg (ref 26.0–34.0)
MCHC: 31.9 g/dL (ref 30.0–36.0)
MCV: 90.7 fL (ref 78.0–100.0)
Monocytes Absolute: 0.8 10*3/uL (ref 0.1–1.0)
Monocytes Relative: 12 % (ref 3–12)
Neutro Abs: 4.9 10*3/uL (ref 1.7–7.7)
RDW: 16.3 % — ABNORMAL HIGH (ref 11.5–15.5)

## 2013-01-31 NOTE — Telephone Encounter (Signed)
Called wife back to inform of Dr. Lianne Moris recommendation for follow-up with Dr. Donnetta Hutching next week.  Wife verbalized being very upset about the service that this office has given.  States "the 1st thing that went wrong was my husband was supposed to be operated on in the morning on 3/21, and Dr. Donnetta Hutching got called away, and he didn't get his surgery until around 3:00 PM.  Then, I called the office on Monday, and reported how swollen his arm was."  She continued to express how upset she was with our service.  I asked if I could put her on hold a minute; she agreed.  Discussed the above situation with Dr. Bridgett Larsson;  He stated he was on his way to the hospital, and would not be able to see pt. in the office today.   He recommended that pt. go to the ER, if he felt he needed to be seen today.  Wife was given this information.  Stated she was taking the pt. to the [facility] ER.  Nurse apologized to pt's wife, for not being able to see pt. in the office today.

## 2013-01-31 NOTE — Telephone Encounter (Signed)
Wif called office this am approx10:30 AM.  Reported that pt's arm was "infected" @ site of new AVG.  Stated she took him to a Dermatology appt. this morning and that "the Dermatologist looked at the arm and recognized that it was infected".  Reports the dermatologist cultured the arm, and ordered an antibiotic.  Wife reports the left forearm is "red, swollen, and hard."   Discussed with Dr. Bridgett Larsson.  Recommends since pt. has an antibiotic already ordered, that he should be given appt. with Dr. Donnetta Hutching next week for follow-up.

## 2013-01-31 NOTE — ED Notes (Signed)
Pt c/o (L) arm pain, redness and swelling x2 days. Pt had a AV shunt placed to (L) forearm 1 week ago by Dr. Donnetta Hutching to get pt ready for the beginning of dialysis treatment. Redness and swelling noted to (L) arm.

## 2013-01-31 NOTE — Progress Notes (Signed)
Patient ID: [name], male   DOB: [birth year], 79 y.o.   MRN: [id number] The patient presents today for evaluation of erythema in his left arm. He is a one week today status post placement of new left forearm loop AV Gore-Tex graft. His wife is concerned and brought him to the emergency room for further evaluation. He had seen a dermatologist earlier who felt this may be infected and had written for her antibiotic prescription which they have not field. The patient has not had any fevers or chills. He does have moderate swelling. He did have discomfort overlying the tunneled graft initially and this is beginning to resolve.  On physical exam he has a 2-3+ left radial pulse. His hand is well perfused with no evidence of steal syndrome. The antecubital and distal forearm incision are healing with Steri-Strips intact. He does have a typical "Gore-Tex reaction" in the forearm related to the tunneled Gore-Tex graft.  Impression and plan stable one week followup from left forearm loop graft. I reassured the patient and his wife present. I do not see any evidence of infection therefore would not recommend antibiotic treatment and they will not fill the prescription. He will continue followup with nephrology determined need for initiation of hemodialysis. Does have excellent flow through his graft. Should be able to use the graft for hemodialysis after an additional 3 weeks.

## 2013-01-31 NOTE — ED Notes (Signed)
Restricted band placed on left arm

## 2013-02-02 NOTE — ED Provider Notes (Signed)
History     CSN: [id number]  Arrival date & time 01/31/13  1245   First MD Initiated Contact with Patient 01/31/13 1314      Chief Complaint  Patient presents with  . Vascular Access Problem    HPI Pt c/o (L) arm pain, redness and swelling x2 days. Pt had a AV shunt placed to (L) forearm 1 week ago by Dr. Donnetta Hutching to get pt ready for the beginning of dialysis treatment. Redness and swelling noted to (L) arm.  Past Medical History  Diagnosis Date  . ANEMIA-IRON DEFICIENCY 03/09/2008  . DIVERTICULOSIS, COLON 03/09/2008  . FATIGUE 03/09/2008  . GOUT 03/09/2008  . HYPERLIPIDEMIA 03/09/2008  . HYPERTENSION 03/09/2008  . PROSTATE CANCER, HX OF 03/09/2008  . RENAL INSUFFICIENCY 03/09/2008  . SHOULDER PAIN, LEFT 06/03/2010  . Complex renal cyst 06/19/2011  . Hyperparathyroidism   . Cancer     prostate  . Arthritis   . History of blood transfusion     Past Surgical History  Procedure Laterality Date  . Tonsillectomy    . Rotator cuff repair right  4/08  . S/p left hip replacement  2007    Dr. Percell Miller ortho  . Joint replacement      HIP  . Av fistula placement Left 01/24/2013    Procedure: INSERTION OF ARTERIOVENOUS (AV) GORE-TEX GRAFT ARM;  Surgeon: Rosetta Posner, MD;  Location: [facility] OR;  Service: Vascular;  Laterality: Left;    Family History  Problem Relation Age of Onset  . Hypertension Father   . Diabetes Father   . Cancer Father     prostate cancer  . Hypertension Brother     History  Substance Use Topics  . Smoking status: Former Smoker -- 5 years    Types: Cigarettes    Quit date: 09/17/1982  . Smokeless tobacco: Never Used  . Alcohol Use: No     Comment: rare      Review of Systems  All other systems reviewed and are negative.    Allergies  Review of patient's allergies indicates no known allergies.  Home Medications   Current Outpatient Rx  Name  Route  Sig  Dispense  Refill  . allopurinol (ZYLOPRIM) 100 MG tablet   Oral   Take 1 tablet (100 mg total) by mouth  daily.   90 tablet   3   . amLODipine (NORVASC) 2.5 MG tablet   Oral   Take 2.5 mg by mouth daily.         Marland Kitchen aspirin 81 MG tablet   Oral   Take 81 mg by mouth daily.          Marland Kitchen atenolol (TENORMIN) 50 MG tablet   Oral   Take 0.5 tablets (25 mg total) by mouth daily. Take 1/2 tablet daily   90 tablet   3   . doxercalciferol (HECTOROL) 0.5 MCG capsule   Oral   Take 1.5 mcg by mouth daily.          Marland Kitchen oxyCODONE (ROXICODONE) 5 MG immediate release tablet   Oral   Take 1 tablet (5 mg total) by mouth every 4 (four) hours as needed for pain.   30 tablet   0   . rosuvastatin (CRESTOR) 20 MG tablet   Oral   Take 20 mg by mouth at bedtime.           BP 145/61  Pulse 76  Temp(Src) 97 F (36.1 C) (Oral)  Resp 20  Ht 5'  9" (1.753 m)  Wt 150 lb (68.04 kg)  BMI 22.14 kg/m2  SpO2 99%  Physical Exam  Nursing note and vitals reviewed. Constitutional: He is oriented to person, place, and time. He appears well-developed and well-nourished. No distress.  HENT:  Head: Normocephalic and atraumatic.  Eyes: Pupils are equal, round, and reactive to light.  Neck: Normal range of motion.  Cardiovascular: Normal rate and intact distal pulses.   Pulmonary/Chest: No respiratory distress.  Abdominal: Normal appearance. He exhibits no distension.  Musculoskeletal:       Arms: Neurological: He is alert and oriented to person, place, and time. No cranial nerve deficit.  Skin: Skin is warm and dry. No rash noted.  Psychiatric: He has a normal mood and affect. His behavior is normal.    ED Course  Procedures (including critical care time)  Labs Reviewed  CBC WITH DIFFERENTIAL - Abnormal; Notable for the following:    RBC 3.35 (*)    Hemoglobin 9.7 (*)    HCT 30.4 (*)    RDW 16.3 (*)    All other components within normal limits  POCT I-STAT, CHEM 8 - Abnormal; Notable for the following:    Potassium 5.2 (*)    BUN 75 (*)    Creatinine, Ser 4.80 (*)    Glucose, Bld 111 (*)      Hemoglobin 10.2 (*)    HCT 30.0 (*)    All other components within normal limits   No results found.   1. Post-op pain       MDM  Patient evaluated in the ED by Dr. Donnetta Hutching.  No infection.  Will f/u as instructed with Dr. Dian Queen, MD 02/02/13 2133

## 2013-02-12 ENCOUNTER — Encounter (HOSPITAL_COMMUNITY)
Admission: RE | Admit: 2013-02-12 | Discharge: 2013-02-12 | Disposition: A | Discharge status: Home / Self Care | Payer: Medicare Other | Source: Ambulatory Visit | Attending: Nephrology | Admitting: Nephrology

## 2013-02-12 DIAGNOSIS — D638 Anemia in other chronic diseases classified elsewhere: Secondary | ICD-10-CM | POA: Insufficient documentation

## 2013-02-12 DIAGNOSIS — N184 Chronic kidney disease, stage 4 (severe): Secondary | ICD-10-CM | POA: Diagnosis not present

## 2013-02-12 LAB — IRON AND TIBC: UIBC: 185 ug/dL (ref 125–400)

## 2013-02-12 MED ORDER — EPOETIN ALFA 10000 UNIT/ML IJ SOLN: 10000.0000 [IU] | INTRAMUSCULAR | Status: DC

## 2013-02-12 MED ORDER — EPOETIN ALFA 10000 UNIT/ML IJ SOLN
INTRAMUSCULAR | Status: AC
  Administered 2013-02-12: 10000 [IU] via SUBCUTANEOUS
  Filled 2013-02-12: qty 1

## 2013-03-12 ENCOUNTER — Encounter (HOSPITAL_COMMUNITY)
Admission: RE | Admit: 2013-03-12 | Discharge: 2013-03-12 | Disposition: A | Discharge status: Home / Self Care | Payer: Medicare Other | Source: Ambulatory Visit | Attending: Nephrology | Admitting: Nephrology

## 2013-03-12 DIAGNOSIS — N184 Chronic kidney disease, stage 4 (severe): Secondary | ICD-10-CM | POA: Diagnosis not present

## 2013-03-12 DIAGNOSIS — D638 Anemia in other chronic diseases classified elsewhere: Secondary | ICD-10-CM | POA: Diagnosis not present

## 2013-03-12 LAB — IRON AND TIBC
Saturation Ratios: 24 % (ref 20–55)
TIBC: 279 ug/dL (ref 215–435)
UIBC: 211 ug/dL (ref 125–400)

## 2013-03-12 MED ORDER — EPOETIN ALFA 10000 UNIT/ML IJ SOLN
10000.0000 [IU] | INTRAMUSCULAR | Status: DC
  Administered 2013-03-12: 10000 [IU] via SUBCUTANEOUS

## 2013-03-12 MED ORDER — EPOETIN ALFA 10000 UNIT/ML IJ SOLN
INTRAMUSCULAR | Status: AC
  Filled 2013-03-12: qty 1

## 2013-03-13 DIAGNOSIS — L259 Unspecified contact dermatitis, unspecified cause: Secondary | ICD-10-CM | POA: Diagnosis not present

## 2013-03-14 ENCOUNTER — Other Ambulatory Visit (INDEPENDENT_AMBULATORY_CARE_PROVIDER_SITE_OTHER): Payer: Medicare Other

## 2013-03-14 ENCOUNTER — Ambulatory Visit (INDEPENDENT_AMBULATORY_CARE_PROVIDER_SITE_OTHER): Payer: Medicare Other | Admitting: Internal Medicine

## 2013-03-14 ENCOUNTER — Encounter: Payer: Self-pay | Admitting: Internal Medicine

## 2013-03-14 VITALS — BP 142/52 | HR 74 | Temp 97.8°F | Ht 69.0 in | Wt 157.0 lb

## 2013-03-14 DIAGNOSIS — K921 Melena: Secondary | ICD-10-CM | POA: Diagnosis not present

## 2013-03-14 DIAGNOSIS — D509 Iron deficiency anemia, unspecified: Secondary | ICD-10-CM

## 2013-03-14 DIAGNOSIS — I10 Essential (primary) hypertension: Secondary | ICD-10-CM | POA: Diagnosis not present

## 2013-03-14 LAB — CBC WITH DIFFERENTIAL/PLATELET
Eosinophils Absolute: 0.2 10*3/uL (ref 0.0–0.7)
MCHC: 32.8 g/dL (ref 30.0–36.0)
MCV: 90.7 fl (ref 78.0–100.0)
Monocytes Absolute: 1.1 10*3/uL — ABNORMAL HIGH (ref 0.1–1.0)
Neutrophils Relative %: 59.8 % (ref 43.0–77.0)
Platelets: 218 10*3/uL (ref 150.0–400.0)
WBC: 6.2 10*3/uL (ref 4.5–10.5)

## 2013-03-14 NOTE — Progress Notes (Addendum)
Subjective:    Patient ID: [name], male    DOB: 01-19-1929, 79 y.o.   MRN: [id number]  HPI  Here to f/u with family,  C/o episode x 1 small volume BRBPR yesterday without abd pain, n/v, fever, wt loss or chills.  No orthostasis.  Has been tx with parenteral Iron recent per pt, but recent hgb seem to be trending down.  May start dialysis relatively soon.  Also incidentally with 2-3 days onset left lbp but no bowel or bladder change, fever, wt loss,  worsening LE pain/numbness/weakness, gait change or falls.  Pt denies fever, wt loss, night sweats, but has had mild loss of appetite.  Last colonoscopy 2010 per Dr Dennie Fetters - int hemorrhoids only Past Medical History  Diagnosis Date  . ANEMIA-IRON DEFICIENCY 03/09/2008  . DIVERTICULOSIS, COLON 03/09/2008  . FATIGUE 03/09/2008  . GOUT 03/09/2008  . HYPERLIPIDEMIA 03/09/2008  . HYPERTENSION 03/09/2008  . PROSTATE CANCER, HX OF 03/09/2008  . RENAL INSUFFICIENCY 03/09/2008  . SHOULDER PAIN, LEFT 06/03/2010  . Complex renal cyst 06/19/2011  . Hyperparathyroidism   . Cancer     prostate  . Arthritis   . History of blood transfusion    Past Surgical History  Procedure Laterality Date  . Tonsillectomy    . Rotator cuff repair right  4/08  . S/p left hip replacement  2007    Dr. Percell Miller ortho  . Joint replacement      HIP  . Av fistula placement Left 01/24/2013    Procedure: INSERTION OF ARTERIOVENOUS (AV) GORE-TEX GRAFT ARM;  Surgeon: Rosetta Posner, MD;  Location: [facility];  Service: Vascular;  Laterality: Left;    reports that he quit smoking about 30 years ago. His smoking use included Cigarettes. He smoked 0.00 packs per day for 5 years. He has never used smokeless tobacco. He reports that he does not drink alcohol or use illicit drugs. family history includes Cancer in his father; Diabetes in his father; and Hypertension in his brother and father. No Known Allergies Current Outpatient Prescriptions on File Prior to Visit  Medication Sig Dispense Refill   . allopurinol (ZYLOPRIM) 100 MG tablet Take 1 tablet (100 mg total) by mouth daily.  90 tablet  3  . amLODipine (NORVASC) 2.5 MG tablet Take 2.5 mg by mouth daily.      Marland Kitchen aspirin 81 MG tablet Take 81 mg by mouth daily.       Marland Kitchen atenolol (TENORMIN) 50 MG tablet Take 0.5 tablets (25 mg total) by mouth daily. Take 1/2 tablet daily  90 tablet  3  . doxercalciferol (HECTOROL) 0.5 MCG capsule Take 1.5 mcg by mouth daily.       Marland Kitchen oxyCODONE (ROXICODONE) 5 MG immediate release tablet Take 1 tablet (5 mg total) by mouth every 4 (four) hours as needed for pain.  30 tablet  0  . rosuvastatin (CRESTOR) 20 MG tablet Take 20 mg by mouth at bedtime.       No current facility-administered medications on file prior to visit.   Review of Systems  Constitutional: Negative for unexpected weight change, or unusual diaphoresis  HENT: Negative for tinnitus.   Eyes: Negative for photophobia and visual disturbance.  Respiratory: Negative for choking and stridor.   Gastrointestinal: Negative for vomiting and blood in stool.  Genitourinary: Negative for hematuria and decreased urine volume.  Musculoskeletal: Negative for acute joint swelling Skin: Negative for color change and wound.  Neurological: Negative for tremors and numbness other than noted  Psychiatric/Behavioral: Negative for decreased concentration or  hyperactivity.       Objective:   Physical Exam BP 142/52  Pulse 74  Temp(Src) 97.8 F (36.6 C) (Oral)  Ht 5\' 9"  (1.753 m)  Wt 157 lb (71.215 kg)  BMI 23.17 kg/m2  SpO2 92% VS noted, not ill appearing Constitutional: Pt appears well-developed and well-nourished.  HENT: Head: NCAT.  Right Ear: External ear normal.  Left Ear: External ear normal.  Eyes: Conjunctivae and EOM are normal. Pupils are equal, round, and reactive to light.  Neck: Normal range of motion. Neck supple.  Cardiovascular: Normal rate and regular rhythm.   Pulmonary/Chest: Effort normal and breath sounds normal.  Abd:  Soft,  NT, non-distended, + BS Spine: nontender, has some mild left lumbar paravertebtral tender/spasm DRE; deferred per pt Neurological: Pt is alert. Skin: Skin is warm. No erythema.  Psychiatric: Pt behavior is normal. Assessment & Plan:  Quality Measures addressed:  Pneumonia Vaccine: pt declines, may self-refer to local pharmacy

## 2013-03-14 NOTE — Assessment & Plan Note (Signed)
Recent iron normal,  to f/u any worsening symptoms or concerns

## 2013-03-14 NOTE — Assessment & Plan Note (Signed)
stable overall by history and exam, recent data reviewed with pt, and pt to continue medical treatment as before,  to f/u any worsening symptoms or concerns BP Readings from Last 3 Encounters:  03/14/13 142/52  03/12/13 153/60  02/12/13 136/62

## 2013-03-14 NOTE — Assessment & Plan Note (Addendum)
Small volume by hx, doubt related to the left lower back pain, painless without orthostatic symptoms, exam benign but with recent drifting hgb down; for f/u hgb today, suspect diverticular bleed, consider GI referral, to ER if any further bleeding, pain, dizziness

## 2013-03-14 NOTE — Patient Instructions (Signed)
Please continue all other medications as before, and refills have been done if requested. Please have the pharmacy call with any other refills you may need. Please keep your appointments with your specialists as you have planned  - renal Please go to the LAB in the Basement (turn left off the elevator) for the tests to be done today You will be contacted by phone if any changes need to be made immediately.  Otherwise, you will receive a letter about your results with an explanation Please remember to sign up for My Chart if you have not done so, as this will be important to you in the future with finding out test results, communicating by private email, and scheduling acute appointments online when needed.

## 2013-03-17 ENCOUNTER — Telehealth: Payer: Self-pay

## 2013-03-17 ENCOUNTER — Other Ambulatory Visit (INDEPENDENT_AMBULATORY_CARE_PROVIDER_SITE_OTHER): Payer: Medicare Other

## 2013-03-17 DIAGNOSIS — D649 Anemia, unspecified: Secondary | ICD-10-CM | POA: Diagnosis not present

## 2013-03-17 LAB — CBC WITH DIFFERENTIAL/PLATELET
Basophils Relative: 1.7 % (ref 0.0–3.0)
Eosinophils Relative: 2.8 % (ref 0.0–5.0)
Lymphocytes Relative: 14.4 % (ref 12.0–46.0)
Monocytes Relative: 13 % — ABNORMAL HIGH (ref 3.0–12.0)
Neutrophils Relative %: 68.1 % (ref 43.0–77.0)
RBC: 3.21 Mil/uL — ABNORMAL LOW (ref 4.22–5.81)
WBC: 6.2 10*3/uL (ref 4.5–10.5)

## 2013-03-17 NOTE — Telephone Encounter (Signed)
Lab entered

## 2013-03-27 DIAGNOSIS — C61 Malignant neoplasm of prostate: Secondary | ICD-10-CM | POA: Diagnosis not present

## 2013-04-02 DIAGNOSIS — N3941 Urge incontinence: Secondary | ICD-10-CM | POA: Diagnosis not present

## 2013-04-02 DIAGNOSIS — C61 Malignant neoplasm of prostate: Secondary | ICD-10-CM | POA: Diagnosis not present

## 2013-04-03 ENCOUNTER — Telehealth: Payer: Self-pay

## 2013-04-03 MED ORDER — AMLODIPINE BESYLATE 2.5 MG PO TABS: 2.5000 mg | ORAL_TABLET | Freq: Every day | ORAL | Status: DC

## 2013-04-03 NOTE — Telephone Encounter (Signed)
Patient called requesting amlodipine sent to [facility].

## 2013-04-08 ENCOUNTER — Other Ambulatory Visit (HOSPITAL_COMMUNITY): Payer: Self-pay | Admitting: *Deleted

## 2013-04-09 ENCOUNTER — Encounter (HOSPITAL_COMMUNITY)
Admission: RE | Admit: 2013-04-09 | Discharge: 2013-04-09 | Disposition: A | Discharge status: Home / Self Care | Payer: Medicare Other | Source: Ambulatory Visit | Attending: Nephrology | Admitting: Nephrology

## 2013-04-09 DIAGNOSIS — D638 Anemia in other chronic diseases classified elsewhere: Secondary | ICD-10-CM | POA: Insufficient documentation

## 2013-04-09 DIAGNOSIS — N184 Chronic kidney disease, stage 4 (severe): Secondary | ICD-10-CM | POA: Insufficient documentation

## 2013-04-09 LAB — IRON AND TIBC
Iron: 79 ug/dL (ref 42–135)
Saturation Ratios: 29 % (ref 20–55)
TIBC: 269 ug/dL (ref 215–435)
UIBC: 190 ug/dL (ref 125–400)

## 2013-04-09 LAB — POCT HEMOGLOBIN-HEMACUE: Hemoglobin: 9.2 g/dL — ABNORMAL LOW (ref 13.0–17.0)

## 2013-04-09 MED ORDER — EPOETIN ALFA 20000 UNIT/ML IJ SOLN
20000.0000 [IU] | INTRAMUSCULAR | Status: DC
  Administered 2013-04-09: 20000 [IU] via SUBCUTANEOUS

## 2013-04-09 MED ORDER — EPOETIN ALFA 20000 UNIT/ML IJ SOLN
INTRAMUSCULAR | Status: AC
  Filled 2013-04-09: qty 1

## 2013-05-02 DIAGNOSIS — N390 Urinary tract infection, site not specified: Secondary | ICD-10-CM | POA: Diagnosis not present

## 2013-05-02 DIAGNOSIS — D649 Anemia, unspecified: Secondary | ICD-10-CM | POA: Diagnosis not present

## 2013-05-02 DIAGNOSIS — N2581 Secondary hyperparathyroidism of renal origin: Secondary | ICD-10-CM | POA: Diagnosis not present

## 2013-05-02 DIAGNOSIS — E213 Hyperparathyroidism, unspecified: Secondary | ICD-10-CM | POA: Diagnosis not present

## 2013-05-02 DIAGNOSIS — I129 Hypertensive chronic kidney disease with stage 1 through stage 4 chronic kidney disease, or unspecified chronic kidney disease: Secondary | ICD-10-CM | POA: Diagnosis not present

## 2013-05-02 DIAGNOSIS — I10 Essential (primary) hypertension: Secondary | ICD-10-CM | POA: Diagnosis not present

## 2013-05-02 DIAGNOSIS — N184 Chronic kidney disease, stage 4 (severe): Secondary | ICD-10-CM | POA: Diagnosis not present

## 2013-05-06 ENCOUNTER — Other Ambulatory Visit (HOSPITAL_COMMUNITY): Payer: Self-pay | Admitting: *Deleted

## 2013-05-07 ENCOUNTER — Encounter (HOSPITAL_COMMUNITY)
Admission: RE | Admit: 2013-05-07 | Discharge: 2013-05-07 | Disposition: A | Discharge status: Home / Self Care | Payer: Medicare Other | Source: Ambulatory Visit | Attending: Nephrology | Admitting: Nephrology

## 2013-05-07 DIAGNOSIS — D638 Anemia in other chronic diseases classified elsewhere: Secondary | ICD-10-CM | POA: Diagnosis not present

## 2013-05-07 DIAGNOSIS — N184 Chronic kidney disease, stage 4 (severe): Secondary | ICD-10-CM | POA: Insufficient documentation

## 2013-05-07 LAB — IRON AND TIBC: Saturation Ratios: 31 % (ref 20–55)

## 2013-05-07 MED ORDER — EPOETIN ALFA 40000 UNIT/ML IJ SOLN: 30000.0000 [IU] | INTRAMUSCULAR | Status: DC

## 2013-05-07 MED ORDER — EPOETIN ALFA 20000 UNIT/ML IJ SOLN
INTRAMUSCULAR | Status: AC
  Administered 2013-05-07: 20000 [IU] via SUBCUTANEOUS
  Filled 2013-05-07: qty 1

## 2013-05-07 MED ORDER — EPOETIN ALFA 10000 UNIT/ML IJ SOLN
INTRAMUSCULAR | Status: AC
  Administered 2013-05-07: 10000 [IU] via SUBCUTANEOUS
  Filled 2013-05-07: qty 1

## 2013-05-15 ENCOUNTER — Encounter: Payer: Self-pay | Admitting: Nephrology

## 2013-06-04 ENCOUNTER — Encounter (HOSPITAL_COMMUNITY)
Admission: RE | Admit: 2013-06-04 | Discharge: 2013-06-04 | Disposition: A | Discharge status: Home / Self Care | Payer: Medicare Other | Source: Ambulatory Visit | Attending: Nephrology | Admitting: Nephrology

## 2013-06-04 MED ORDER — EPOETIN ALFA 20000 UNIT/ML IJ SOLN
INTRAMUSCULAR | Status: AC
  Administered 2013-06-04: 20000 [IU]
  Filled 2013-06-04: qty 1

## 2013-06-04 MED ORDER — EPOETIN ALFA 40000 UNIT/ML IJ SOLN: 30000.0000 [IU] | INTRAMUSCULAR | Status: DC

## 2013-06-04 MED ORDER — EPOETIN ALFA 10000 UNIT/ML IJ SOLN
INTRAMUSCULAR | Status: AC
  Administered 2013-06-04: 10000 [IU]
  Filled 2013-06-04: qty 1

## 2013-07-02 ENCOUNTER — Encounter (HOSPITAL_COMMUNITY)
Admission: RE | Admit: 2013-07-02 | Discharge: 2013-07-02 | Disposition: A | Discharge status: Home / Self Care | Payer: Medicare Other | Source: Ambulatory Visit | Attending: Nephrology | Admitting: Nephrology

## 2013-07-02 DIAGNOSIS — D638 Anemia in other chronic diseases classified elsewhere: Secondary | ICD-10-CM | POA: Diagnosis not present

## 2013-07-02 DIAGNOSIS — N184 Chronic kidney disease, stage 4 (severe): Secondary | ICD-10-CM | POA: Diagnosis not present

## 2013-07-02 LAB — POCT HEMOGLOBIN-HEMACUE: Hemoglobin: 10.8 g/dL — ABNORMAL LOW (ref 13.0–17.0)

## 2013-07-02 LAB — IRON AND TIBC
Iron: 98 ug/dL (ref 42–135)
Saturation Ratios: 36 % (ref 20–55)
UIBC: 172 ug/dL (ref 125–400)

## 2013-07-02 MED ORDER — EPOETIN ALFA 10000 UNIT/ML IJ SOLN
INTRAMUSCULAR | Status: AC
  Administered 2013-07-02: 10000 [IU] via SUBCUTANEOUS
  Filled 2013-07-02: qty 1

## 2013-07-02 MED ORDER — EPOETIN ALFA 20000 UNIT/ML IJ SOLN
INTRAMUSCULAR | Status: AC
  Administered 2013-07-02: 20000 [IU] via SUBCUTANEOUS
  Filled 2013-07-02: qty 1

## 2013-07-02 MED ORDER — EPOETIN ALFA 40000 UNIT/ML IJ SOLN: 30000.0000 [IU] | INTRAMUSCULAR | Status: DC

## 2013-07-04 DIAGNOSIS — D509 Iron deficiency anemia, unspecified: Secondary | ICD-10-CM | POA: Diagnosis not present

## 2013-07-04 DIAGNOSIS — E213 Hyperparathyroidism, unspecified: Secondary | ICD-10-CM | POA: Diagnosis not present

## 2013-07-04 DIAGNOSIS — I10 Essential (primary) hypertension: Secondary | ICD-10-CM | POA: Diagnosis not present

## 2013-07-04 DIAGNOSIS — I120 Hypertensive chronic kidney disease with stage 5 chronic kidney disease or end stage renal disease: Secondary | ICD-10-CM | POA: Diagnosis not present

## 2013-07-04 DIAGNOSIS — N2581 Secondary hyperparathyroidism of renal origin: Secondary | ICD-10-CM | POA: Diagnosis not present

## 2013-07-04 DIAGNOSIS — N185 Chronic kidney disease, stage 5: Secondary | ICD-10-CM | POA: Diagnosis not present

## 2013-07-29 ENCOUNTER — Other Ambulatory Visit (HOSPITAL_COMMUNITY): Payer: Self-pay | Admitting: *Deleted

## 2013-07-30 ENCOUNTER — Encounter (HOSPITAL_COMMUNITY)
Admission: RE | Admit: 2013-07-30 | Discharge: 2013-07-30 | Disposition: A | Discharge status: Home / Self Care | Payer: Medicare Other | Source: Ambulatory Visit | Attending: Nephrology | Admitting: Nephrology

## 2013-07-30 DIAGNOSIS — D638 Anemia in other chronic diseases classified elsewhere: Secondary | ICD-10-CM | POA: Diagnosis not present

## 2013-07-30 DIAGNOSIS — N184 Chronic kidney disease, stage 4 (severe): Secondary | ICD-10-CM | POA: Insufficient documentation

## 2013-07-30 LAB — IRON AND TIBC
Iron: 126 ug/dL (ref 42–135)
UIBC: 150 ug/dL (ref 125–400)

## 2013-07-30 MED ORDER — EPOETIN ALFA 40000 UNIT/ML IJ SOLN
30000.0000 [IU] | INTRAMUSCULAR | Status: DC
  Administered 2013-07-30: 30000 [IU] via SUBCUTANEOUS

## 2013-07-30 MED ORDER — EPOETIN ALFA 10000 UNIT/ML IJ SOLN
INTRAMUSCULAR | Status: AC
  Filled 2013-07-30: qty 1

## 2013-07-30 MED ORDER — EPOETIN ALFA 20000 UNIT/ML IJ SOLN
INTRAMUSCULAR | Status: AC
  Filled 2013-07-30: qty 1

## 2013-07-31 MED FILL — Epoetin Alfa Inj 10000 Unit/ML: INTRAMUSCULAR | Qty: 1 | Status: AC

## 2013-07-31 MED FILL — Epoetin Alfa Inj 20000 Unit/ML: INTRAMUSCULAR | Qty: 1 | Status: AC

## 2013-08-06 DIAGNOSIS — N184 Chronic kidney disease, stage 4 (severe): Secondary | ICD-10-CM | POA: Diagnosis not present

## 2013-08-06 DIAGNOSIS — I129 Hypertensive chronic kidney disease with stage 1 through stage 4 chronic kidney disease, or unspecified chronic kidney disease: Secondary | ICD-10-CM | POA: Diagnosis not present

## 2013-08-06 DIAGNOSIS — R809 Proteinuria, unspecified: Secondary | ICD-10-CM | POA: Diagnosis not present

## 2013-08-06 DIAGNOSIS — D649 Anemia, unspecified: Secondary | ICD-10-CM | POA: Diagnosis not present

## 2013-08-07 DIAGNOSIS — I120 Hypertensive chronic kidney disease with stage 5 chronic kidney disease or end stage renal disease: Secondary | ICD-10-CM | POA: Diagnosis not present

## 2013-08-25 DIAGNOSIS — N390 Urinary tract infection, site not specified: Secondary | ICD-10-CM | POA: Diagnosis not present

## 2013-08-27 ENCOUNTER — Encounter (HOSPITAL_COMMUNITY)
Admission: RE | Admit: 2013-08-27 | Discharge: 2013-08-27 | Disposition: A | Discharge status: Home / Self Care | Payer: Medicare Other | Source: Ambulatory Visit | Attending: Nephrology | Admitting: Nephrology

## 2013-08-27 DIAGNOSIS — D638 Anemia in other chronic diseases classified elsewhere: Secondary | ICD-10-CM | POA: Diagnosis not present

## 2013-08-27 DIAGNOSIS — N184 Chronic kidney disease, stage 4 (severe): Secondary | ICD-10-CM | POA: Diagnosis not present

## 2013-08-27 LAB — IRON AND TIBC
Saturation Ratios: 41 % (ref 20–55)
TIBC: 262 ug/dL (ref 215–435)
UIBC: 154 ug/dL (ref 125–400)

## 2013-08-27 MED ORDER — EPOETIN ALFA 10000 UNIT/ML IJ SOLN
INTRAMUSCULAR | Status: AC
  Filled 2013-08-27: qty 1

## 2013-08-27 MED ORDER — EPOETIN ALFA 20000 UNIT/ML IJ SOLN
INTRAMUSCULAR | Status: AC
  Filled 2013-08-27: qty 1

## 2013-08-27 MED ORDER — EPOETIN ALFA 40000 UNIT/ML IJ SOLN: 30000.0000 [IU] | INTRAMUSCULAR | Status: DC

## 2013-08-28 MED FILL — Epoetin Alfa Inj 10000 Unit/ML: INTRAMUSCULAR | Qty: 1 | Status: AC

## 2013-08-28 MED FILL — Epoetin Alfa Inj 20000 Unit/ML: INTRAMUSCULAR | Qty: 1 | Status: AC

## 2013-09-24 ENCOUNTER — Encounter (HOSPITAL_COMMUNITY)
Admission: RE | Admit: 2013-09-24 | Discharge: 2013-09-24 | Disposition: A | Discharge status: Home / Self Care | Payer: Medicare Other | Source: Ambulatory Visit | Attending: Nephrology | Admitting: Nephrology

## 2013-09-24 DIAGNOSIS — D638 Anemia in other chronic diseases classified elsewhere: Secondary | ICD-10-CM | POA: Insufficient documentation

## 2013-09-24 DIAGNOSIS — N184 Chronic kidney disease, stage 4 (severe): Secondary | ICD-10-CM | POA: Insufficient documentation

## 2013-09-24 LAB — IRON AND TIBC: Iron: 99 ug/dL (ref 42–135)

## 2013-09-24 MED ORDER — EPOETIN ALFA 20000 UNIT/ML IJ SOLN
INTRAMUSCULAR | Status: AC
  Administered 2013-09-24: 11:00:00 20000 [IU]
  Filled 2013-09-24: qty 1

## 2013-09-24 MED ORDER — EPOETIN ALFA 10000 UNIT/ML IJ SOLN
INTRAMUSCULAR | Status: AC
  Administered 2013-09-24: 10000 [IU]
  Filled 2013-09-24: qty 1

## 2013-09-24 MED ORDER — EPOETIN ALFA 40000 UNIT/ML IJ SOLN: 30000.0000 [IU] | INTRAMUSCULAR | Status: DC

## 2013-10-01 DIAGNOSIS — C61 Malignant neoplasm of prostate: Secondary | ICD-10-CM | POA: Diagnosis not present

## 2013-10-08 DIAGNOSIS — N3941 Urge incontinence: Secondary | ICD-10-CM | POA: Diagnosis not present

## 2013-10-08 DIAGNOSIS — E291 Testicular hypofunction: Secondary | ICD-10-CM | POA: Diagnosis not present

## 2013-10-08 DIAGNOSIS — N3040 Irradiation cystitis without hematuria: Secondary | ICD-10-CM | POA: Diagnosis not present

## 2013-10-08 DIAGNOSIS — C61 Malignant neoplasm of prostate: Secondary | ICD-10-CM | POA: Diagnosis not present

## 2013-10-13 DIAGNOSIS — D631 Anemia in chronic kidney disease: Secondary | ICD-10-CM | POA: Diagnosis not present

## 2013-10-13 DIAGNOSIS — N2581 Secondary hyperparathyroidism of renal origin: Secondary | ICD-10-CM | POA: Diagnosis not present

## 2013-10-13 DIAGNOSIS — M109 Gout, unspecified: Secondary | ICD-10-CM | POA: Diagnosis not present

## 2013-10-13 DIAGNOSIS — I120 Hypertensive chronic kidney disease with stage 5 chronic kidney disease or end stage renal disease: Secondary | ICD-10-CM | POA: Diagnosis not present

## 2013-10-13 DIAGNOSIS — N185 Chronic kidney disease, stage 5: Secondary | ICD-10-CM | POA: Diagnosis not present

## 2013-10-14 ENCOUNTER — Telehealth: Payer: Self-pay | Admitting: Internal Medicine

## 2013-10-14 NOTE — Telephone Encounter (Signed)
Called the patient informed of MD instructions.  The patient agreed to schedule appointment.

## 2013-10-14 NOTE — Telephone Encounter (Signed)
Pt request refill for amlodpine 2.5 mg. Pt also request for the dosage to go up to 10 mg. Please advise.

## 2013-10-14 NOTE — Telephone Encounter (Signed)
Please consider OV ,as an increase of this kind can lead to problems if too much of dizziness, weakness, tendency to fall or leg swelling

## 2013-10-15 ENCOUNTER — Encounter: Payer: Self-pay | Admitting: Internal Medicine

## 2013-10-15 ENCOUNTER — Ambulatory Visit (INDEPENDENT_AMBULATORY_CARE_PROVIDER_SITE_OTHER): Payer: Medicare Other | Admitting: Internal Medicine

## 2013-10-15 VITALS — BP 132/62 | HR 69 | Temp 97.7°F | Ht 69.0 in | Wt 155.4 lb

## 2013-10-15 DIAGNOSIS — N186 End stage renal disease: Secondary | ICD-10-CM | POA: Diagnosis not present

## 2013-10-15 DIAGNOSIS — I10 Essential (primary) hypertension: Secondary | ICD-10-CM

## 2013-10-15 DIAGNOSIS — E785 Hyperlipidemia, unspecified: Secondary | ICD-10-CM

## 2013-10-15 MED ORDER — AMLODIPINE BESYLATE 10 MG PO TABS: 10.0000 mg | ORAL_TABLET | Freq: Every day | ORAL | Status: DC

## 2013-10-15 NOTE — Assessment & Plan Note (Addendum)
stable overall by history and exam, recent data reviewed with pt, and pt to continue medical treatment as before,  to f/u any worsening symptoms or concerns Lab Results  Component Value Date   WBC 6.2 03/17/2013   HGB 11.2* 09/24/2013   HCT 29.0* 03/17/2013   PLT 232.0 03/17/2013   GLUCOSE 111* 01/31/2013   CHOL 184 07/26/2012   TRIG 141.0 07/26/2012   HDL 59.30 07/26/2012   LDLCALC 97 07/26/2012   ALT 11 07/26/2012   AST 16 07/26/2012   NA 139 01/31/2013   K 5.2* 01/31/2013   CL 110 01/31/2013   CREATININE 4.80* 01/31/2013   BUN 75* 01/31/2013   CO2 25 07/26/2012   TSH 0.83 07/26/2012   Has f/u with renal with further labs next month

## 2013-10-15 NOTE — Assessment & Plan Note (Signed)
Improve and now stable overall by history and exam, recent data reviewed with pt, and pt to continue medical treatment as before,  to f/u any worsening symptoms or concerns BP Readings from Last 3 Encounters:  10/15/13 132/62  09/24/13 135/56  08/27/13 143/50

## 2013-10-15 NOTE — Progress Notes (Signed)
Pre-visit discussion using our clinic review tool. No additional management support is needed unless otherwise documented below in the visit note.  

## 2013-10-15 NOTE — Assessment & Plan Note (Signed)
stable overall by history and exam, recent data reviewed with pt, and pt to continue medical treatment as before,  to f/u any worsening symptoms or concerns Lab Results  Component Value Date   LDLCALC 97 07/26/2012

## 2013-10-15 NOTE — Patient Instructions (Addendum)
Please continue all other medications as before, and refills have been done if requested. - the amlodipine 10 mg per day  Please have the pharmacy call with any other refills you may need.  Please keep your appointments with your specialists as you have planned

## 2013-10-15 NOTE — Progress Notes (Signed)
Subjective:    Patient ID: [name], male    DOB: Dec 31, 1928, 79 y.o.   MRN: [id number]  HPI  Here to fu,Here to f/u; overall doing ok,  Pt denies chest pain, increased sob or doe, wheezing, orthopnea, PND, increased LE swelling, palpitations, dizziness or syncope.  Pt denies polydipsia, polyuria, or low sugar symptoms such as weakness or confusion improved with po intake.  Pt denies new neurological symptoms such as new headache, or facial or extremity weakness or numbness.   Pt states overall good compliance with meds, has been trying to follow lower cholesterol diet, with wt overall stable.  BP improved from recent increase in amlodpine from 2.5 to 10 mg, asking for rx to [facility] today.  Has some mild ankle edema o/w no change on higher dose amlodipine.  Not yet needed to start dialysis though has the access Past Medical History  Diagnosis Date  . ANEMIA-IRON DEFICIENCY 03/09/2008  . DIVERTICULOSIS, COLON 03/09/2008  . FATIGUE 03/09/2008  . GOUT 03/09/2008  . HYPERLIPIDEMIA 03/09/2008  . HYPERTENSION 03/09/2008  . PROSTATE CANCER, HX OF 03/09/2008  . RENAL INSUFFICIENCY 03/09/2008  . SHOULDER PAIN, LEFT 06/03/2010  . Complex renal cyst 06/19/2011  . Hyperparathyroidism   . Cancer     prostate  . Arthritis   . History of blood transfusion    Past Surgical History  Procedure Laterality Date  . Tonsillectomy    . Rotator cuff repair right  4/08  . S/p left hip replacement  2007    Dr. Percell Miller ortho  . Joint replacement      HIP  . Av fistula placement Left 01/24/2013    Procedure: INSERTION OF ARTERIOVENOUS (AV) GORE-TEX GRAFT ARM;  Surgeon: Rosetta Posner, MD;  Location: [facility];  Service: Vascular;  Laterality: Left;    reports that he quit smoking about 31 years ago. His smoking use included Cigarettes. He smoked 0.00 packs per day for 5 years. He has never used smokeless tobacco. He reports that he does not drink alcohol or use illicit drugs. family history includes Cancer in his  father; Diabetes in his father; Hypertension in his brother and father. No Known Allergies Current Outpatient Prescriptions on File Prior to Visit  Medication Sig Dispense Refill  . allopurinol (ZYLOPRIM) 100 MG tablet Take 1 tablet (100 mg total) by mouth daily.  90 tablet  3  . aspirin 81 MG tablet Take 81 mg by mouth daily.       Marland Kitchen atenolol (TENORMIN) 50 MG tablet Take 0.5 tablets (25 mg total) by mouth daily. Take 1/2 tablet daily  90 tablet  3  . doxercalciferol (HECTOROL) 0.5 MCG capsule Take 1.5 mcg by mouth daily.       Marland Kitchen oxyCODONE (ROXICODONE) 5 MG immediate release tablet Take 1 tablet (5 mg total) by mouth every 4 (four) hours as needed for pain.  30 tablet  0  . rosuvastatin (CRESTOR) 20 MG tablet Take 20 mg by mouth at bedtime.       No current facility-administered medications on file prior to visit.   Review of Systems  Constitutional: Negative for unexpected weight change, or unusual diaphoresis  HENT: Negative for tinnitus.   Eyes: Negative for photophobia and visual disturbance.  Respiratory: Negative for choking and stridor.   Gastrointestinal: Negative for vomiting and blood in stool.  Genitourinary: Negative for hematuria and decreased urine volume.  Musculoskeletal: Negative for acute joint swelling Skin: Negative for color change and wound.  Neurological: Negative for tremors and numbness other than noted  Psychiatric/Behavioral: Negative for decreased concentration or  hyperactivity.       Objective:   Physical Exam BP 132/62  Pulse 69  Temp(Src) 97.7 F (36.5 C) (Oral)  Ht 5\' 9"  (1.753 m)  Wt 155 lb 6 oz (70.478 kg)  BMI 22.93 kg/m2  SpO2 97% VS noted, not ill appearing Constitutional: Pt appears well-developed and well-nourished.  HENT: Head: NCAT.  Right Ear: External ear normal.  Left Ear: External ear normal.  Eyes: Conjunctivae and EOM are normal. Pupils are equal, round, and reactive to light.  Neck: Normal range of motion. Neck supple.    Cardiovascular: Normal rate and regular rhythm.   Pulmonary/Chest: Effort normal and breath sounds normal.  Neurological: Pt is alert. Not confused  Skin: Skin is warm. No erythema.  Psychiatric: Pt behavior is normal. Thought content normal.     Assessment & Plan:

## 2013-10-22 ENCOUNTER — Encounter (HOSPITAL_COMMUNITY)
Admission: RE | Admit: 2013-10-22 | Discharge: 2013-10-22 | Disposition: A | Discharge status: Home / Self Care | Payer: Medicare Other | Source: Ambulatory Visit | Attending: Nephrology | Admitting: Nephrology

## 2013-10-22 DIAGNOSIS — N184 Chronic kidney disease, stage 4 (severe): Secondary | ICD-10-CM | POA: Insufficient documentation

## 2013-10-22 DIAGNOSIS — D638 Anemia in other chronic diseases classified elsewhere: Secondary | ICD-10-CM | POA: Diagnosis not present

## 2013-10-22 LAB — POCT HEMOGLOBIN-HEMACUE: Hemoglobin: 10.9 g/dL — ABNORMAL LOW (ref 13.0–17.0)

## 2013-10-22 LAB — IRON AND TIBC
Iron: 89 ug/dL (ref 42–135)
TIBC: 249 ug/dL (ref 215–435)

## 2013-10-22 MED ORDER — EPOETIN ALFA 10000 UNIT/ML IJ SOLN
INTRAMUSCULAR | Status: AC
  Administered 2013-10-22: 10000 [IU] via SUBCUTANEOUS
  Filled 2013-10-22: qty 1

## 2013-10-22 MED ORDER — EPOETIN ALFA 20000 UNIT/ML IJ SOLN
INTRAMUSCULAR | Status: AC
  Administered 2013-10-22: 10:00:00 20000 [IU] via SUBCUTANEOUS
  Filled 2013-10-22: qty 1

## 2013-10-22 MED ORDER — EPOETIN ALFA 40000 UNIT/ML IJ SOLN: 30000.0000 [IU] | INTRAMUSCULAR | Status: DC

## 2013-11-19 ENCOUNTER — Encounter (HOSPITAL_COMMUNITY)
Admission: RE | Admit: 2013-11-19 | Discharge: 2013-11-19 | Disposition: A | Discharge status: Home / Self Care | Payer: Medicare Other | Source: Ambulatory Visit | Attending: Nephrology | Admitting: Nephrology

## 2013-11-19 DIAGNOSIS — D638 Anemia in other chronic diseases classified elsewhere: Secondary | ICD-10-CM | POA: Diagnosis not present

## 2013-11-19 DIAGNOSIS — N184 Chronic kidney disease, stage 4 (severe): Secondary | ICD-10-CM | POA: Diagnosis not present

## 2013-11-19 LAB — IRON AND TIBC
IRON: 53 ug/dL (ref 42–135)
Saturation Ratios: 21 % (ref 20–55)
TIBC: 254 ug/dL (ref 215–435)
UIBC: 201 ug/dL (ref 125–400)

## 2013-11-19 LAB — FERRITIN: Ferritin: 525 ng/mL — ABNORMAL HIGH (ref 22–322)

## 2013-11-19 LAB — POCT HEMOGLOBIN-HEMACUE: HEMOGLOBIN: 10.8 g/dL — AB (ref 13.0–17.0)

## 2013-11-19 MED ORDER — EPOETIN ALFA 20000 UNIT/ML IJ SOLN
INTRAMUSCULAR | Status: AC
  Administered 2013-11-19: 20000 [IU]
  Filled 2013-11-19: qty 1

## 2013-11-19 MED ORDER — EPOETIN ALFA 40000 UNIT/ML IJ SOLN: 30000.0000 [IU] | INTRAMUSCULAR | Status: DC

## 2013-11-19 MED ORDER — EPOETIN ALFA 10000 UNIT/ML IJ SOLN
INTRAMUSCULAR | Status: AC
  Administered 2013-11-19: 11:00:00 10000 [IU]
  Filled 2013-11-19: qty 1

## 2013-12-09 DIAGNOSIS — N185 Chronic kidney disease, stage 5: Secondary | ICD-10-CM | POA: Diagnosis not present

## 2013-12-09 DIAGNOSIS — N039 Chronic nephritic syndrome with unspecified morphologic changes: Secondary | ICD-10-CM | POA: Diagnosis not present

## 2013-12-09 DIAGNOSIS — I129 Hypertensive chronic kidney disease with stage 1 through stage 4 chronic kidney disease, or unspecified chronic kidney disease: Secondary | ICD-10-CM | POA: Diagnosis not present

## 2013-12-09 DIAGNOSIS — N2581 Secondary hyperparathyroidism of renal origin: Secondary | ICD-10-CM | POA: Diagnosis not present

## 2013-12-09 DIAGNOSIS — D631 Anemia in chronic kidney disease: Secondary | ICD-10-CM | POA: Diagnosis not present

## 2013-12-17 ENCOUNTER — Encounter (HOSPITAL_COMMUNITY)
Admission: RE | Admit: 2013-12-17 | Discharge: 2013-12-17 | Disposition: A | Discharge status: Home / Self Care | Payer: Medicare Other | Source: Ambulatory Visit | Attending: Nephrology | Admitting: Nephrology

## 2013-12-17 DIAGNOSIS — N184 Chronic kidney disease, stage 4 (severe): Secondary | ICD-10-CM | POA: Insufficient documentation

## 2013-12-17 DIAGNOSIS — D638 Anemia in other chronic diseases classified elsewhere: Secondary | ICD-10-CM | POA: Diagnosis not present

## 2013-12-17 LAB — POCT HEMOGLOBIN-HEMACUE: Hemoglobin: 11.8 g/dL — ABNORMAL LOW (ref 13.0–17.0)

## 2013-12-17 LAB — FERRITIN: Ferritin: 562 ng/mL — ABNORMAL HIGH (ref 22–322)

## 2013-12-17 LAB — IRON AND TIBC
Iron: 103 ug/dL (ref 42–135)
SATURATION RATIOS: 37 % (ref 20–55)
TIBC: 275 ug/dL (ref 215–435)
UIBC: 172 ug/dL (ref 125–400)

## 2013-12-17 MED ORDER — EPOETIN ALFA 20000 UNIT/ML IJ SOLN
INTRAMUSCULAR | Status: AC
  Administered 2013-12-17: 11:00:00 20000 [IU]
  Filled 2013-12-17: qty 1

## 2013-12-17 MED ORDER — EPOETIN ALFA 40000 UNIT/ML IJ SOLN: 30000.0000 [IU] | INTRAMUSCULAR | Status: DC

## 2013-12-17 MED ORDER — EPOETIN ALFA 10000 UNIT/ML IJ SOLN
INTRAMUSCULAR | Status: AC
  Administered 2013-12-17: 10000 [IU]
  Filled 2013-12-17: qty 1

## 2014-01-14 ENCOUNTER — Encounter (HOSPITAL_COMMUNITY)
Admission: RE | Admit: 2014-01-14 | Discharge: 2014-01-14 | Disposition: A | Discharge status: Home / Self Care | Payer: Medicare Other | Source: Ambulatory Visit | Attending: Nephrology | Admitting: Nephrology

## 2014-01-14 DIAGNOSIS — N184 Chronic kidney disease, stage 4 (severe): Secondary | ICD-10-CM | POA: Diagnosis not present

## 2014-01-14 DIAGNOSIS — D638 Anemia in other chronic diseases classified elsewhere: Secondary | ICD-10-CM | POA: Diagnosis not present

## 2014-01-14 LAB — IRON AND TIBC
Iron: 103 ug/dL (ref 42–135)
Saturation Ratios: 40 % (ref 20–55)
TIBC: 258 ug/dL (ref 215–435)
UIBC: 155 ug/dL (ref 125–400)

## 2014-01-14 LAB — FERRITIN: Ferritin: 552 ng/mL — ABNORMAL HIGH (ref 22–322)

## 2014-01-14 LAB — POCT HEMOGLOBIN-HEMACUE: Hemoglobin: 10.9 g/dL — ABNORMAL LOW (ref 13.0–17.0)

## 2014-01-14 MED ORDER — EPOETIN ALFA 10000 UNIT/ML IJ SOLN
INTRAMUSCULAR | Status: AC
  Administered 2014-01-14: 10000 [IU] via SUBCUTANEOUS
  Filled 2014-01-14: qty 1

## 2014-01-14 MED ORDER — EPOETIN ALFA 40000 UNIT/ML IJ SOLN: 30000.0000 [IU] | INTRAMUSCULAR | Status: DC

## 2014-01-14 MED ORDER — EPOETIN ALFA 20000 UNIT/ML IJ SOLN
INTRAMUSCULAR | Status: AC
  Administered 2014-01-14: 20000 [IU] via SUBCUTANEOUS
  Filled 2014-01-14: qty 1

## 2014-02-03 ENCOUNTER — Other Ambulatory Visit (HOSPITAL_COMMUNITY): Payer: Self-pay | Admitting: *Deleted

## 2014-02-04 ENCOUNTER — Encounter (HOSPITAL_COMMUNITY)
Admission: RE | Admit: 2014-02-04 | Discharge: 2014-02-04 | Disposition: A | Discharge status: Home / Self Care | Payer: Medicare Other | Source: Ambulatory Visit | Attending: Nephrology | Admitting: Nephrology

## 2014-02-04 DIAGNOSIS — D638 Anemia in other chronic diseases classified elsewhere: Secondary | ICD-10-CM | POA: Diagnosis not present

## 2014-02-04 DIAGNOSIS — N184 Chronic kidney disease, stage 4 (severe): Secondary | ICD-10-CM | POA: Insufficient documentation

## 2014-02-04 LAB — IRON AND TIBC
Iron: 104 ug/dL (ref 42–135)
SATURATION RATIOS: 39 % (ref 20–55)
TIBC: 268 ug/dL (ref 215–435)
UIBC: 164 ug/dL (ref 125–400)

## 2014-02-04 LAB — POCT HEMOGLOBIN-HEMACUE: HEMOGLOBIN: 11.3 g/dL — AB (ref 13.0–17.0)

## 2014-02-04 LAB — FERRITIN: FERRITIN: 493 ng/mL — AB (ref 22–322)

## 2014-02-04 MED ORDER — EPOETIN ALFA 10000 UNIT/ML IJ SOLN
INTRAMUSCULAR | Status: AC
  Administered 2014-02-04: 10000 [IU] via SUBCUTANEOUS
  Filled 2014-02-04: qty 1

## 2014-02-04 MED ORDER — EPOETIN ALFA 40000 UNIT/ML IJ SOLN: 30000.0000 [IU] | INTRAMUSCULAR | Status: DC

## 2014-02-04 MED ORDER — EPOETIN ALFA 20000 UNIT/ML IJ SOLN
INTRAMUSCULAR | Status: AC
  Administered 2014-02-04: 20000 [IU] via SUBCUTANEOUS
  Filled 2014-02-04: qty 1

## 2014-02-16 DIAGNOSIS — N185 Chronic kidney disease, stage 5: Secondary | ICD-10-CM | POA: Diagnosis not present

## 2014-02-16 DIAGNOSIS — D631 Anemia in chronic kidney disease: Secondary | ICD-10-CM | POA: Diagnosis not present

## 2014-02-16 DIAGNOSIS — N2581 Secondary hyperparathyroidism of renal origin: Secondary | ICD-10-CM | POA: Diagnosis not present

## 2014-02-16 DIAGNOSIS — I129 Hypertensive chronic kidney disease with stage 1 through stage 4 chronic kidney disease, or unspecified chronic kidney disease: Secondary | ICD-10-CM | POA: Diagnosis not present

## 2014-02-16 DIAGNOSIS — N039 Chronic nephritic syndrome with unspecified morphologic changes: Secondary | ICD-10-CM | POA: Diagnosis not present

## 2014-02-19 DIAGNOSIS — E875 Hyperkalemia: Secondary | ICD-10-CM | POA: Diagnosis not present

## 2014-03-04 ENCOUNTER — Encounter (HOSPITAL_COMMUNITY)
Admission: RE | Admit: 2014-03-04 | Discharge: 2014-03-04 | Disposition: A | Discharge status: Home / Self Care | Payer: Medicare Other | Source: Ambulatory Visit | Attending: Nephrology | Admitting: Nephrology

## 2014-03-04 LAB — IRON AND TIBC
IRON: 112 ug/dL (ref 42–135)
Saturation Ratios: 39 % (ref 20–55)
TIBC: 288 ug/dL (ref 215–435)
UIBC: 176 ug/dL (ref 125–400)

## 2014-03-04 LAB — FERRITIN: Ferritin: 507 ng/mL — ABNORMAL HIGH (ref 22–322)

## 2014-03-04 LAB — POCT HEMOGLOBIN-HEMACUE: Hemoglobin: 11.5 g/dL — ABNORMAL LOW (ref 13.0–17.0)

## 2014-03-04 MED ORDER — EPOETIN ALFA 40000 UNIT/ML IJ SOLN: 30000.0000 [IU] | INTRAMUSCULAR | Status: DC

## 2014-03-04 MED ORDER — EPOETIN ALFA 20000 UNIT/ML IJ SOLN
INTRAMUSCULAR | Status: AC
  Administered 2014-03-04: 20000 [IU] via SUBCUTANEOUS
  Filled 2014-03-04: qty 1

## 2014-03-04 MED ORDER — EPOETIN ALFA 10000 UNIT/ML IJ SOLN
INTRAMUSCULAR | Status: AC
  Administered 2014-03-04: 10000 [IU] via SUBCUTANEOUS
  Filled 2014-03-04: qty 1

## 2014-03-26 ENCOUNTER — Ambulatory Visit (INDEPENDENT_AMBULATORY_CARE_PROVIDER_SITE_OTHER): Payer: Medicare Other | Admitting: Internal Medicine

## 2014-03-26 ENCOUNTER — Encounter: Payer: Self-pay | Admitting: Internal Medicine

## 2014-03-26 VITALS — BP 160/70 | HR 68 | Temp 97.3°F | Ht 69.0 in | Wt 155.4 lb

## 2014-03-26 DIAGNOSIS — F3289 Other specified depressive episodes: Secondary | ICD-10-CM | POA: Diagnosis not present

## 2014-03-26 DIAGNOSIS — F32A Depression, unspecified: Secondary | ICD-10-CM

## 2014-03-26 DIAGNOSIS — I10 Essential (primary) hypertension: Secondary | ICD-10-CM | POA: Diagnosis not present

## 2014-03-26 DIAGNOSIS — F329 Major depressive disorder, single episode, unspecified: Secondary | ICD-10-CM | POA: Diagnosis not present

## 2014-03-26 DIAGNOSIS — M545 Low back pain, unspecified: Secondary | ICD-10-CM | POA: Diagnosis not present

## 2014-03-26 HISTORY — DX: Depression, unspecified: F32.A

## 2014-03-26 MED ORDER — ALLOPURINOL 100 MG PO TABS: 100.0000 mg | ORAL_TABLET | Freq: Every day | ORAL | Status: DC

## 2014-03-26 MED ORDER — CITALOPRAM HYDROBROMIDE 10 MG PO TABS: 10.0000 mg | ORAL_TABLET | Freq: Every day | ORAL | Status: DC

## 2014-03-26 MED ORDER — ROSUVASTATIN CALCIUM 20 MG PO TABS: 20.0000 mg | ORAL_TABLET | Freq: Every day | ORAL | Status: DC

## 2014-03-26 NOTE — Assessment & Plan Note (Signed)
Mild, for start celexa 10 qd

## 2014-03-26 NOTE — Progress Notes (Signed)
Subjective:    Patient ID: [name], male    DOB: [birth year], 80 y.o.   MRN: [id number]  HPI  Here to f/u; overall doing ok,  Pt denies chest pain, increased sob or doe, wheezing, orthopnea, PND, increased LE swelling, palpitations, dizziness or syncope.  Pt denies polydipsia, polyuria, or low sugar symptoms such as weakness or confusion improved with po intake.  Pt denies new neurological symptoms such as new headache, or facial or extremity weakness or numbness.   Pt states overall good compliance with meds, has been trying to follow lower cholesterol diet, with wt overall stable,  but little exercise however.Has left arm fistula for 3 mo, but no dialysis. Pt continues to have recurring left midl to mod LBP without change in severity, bowel or bladder change, fever, wt loss,  worsening LE pain/numbness/weakness, gait change or falls. Has had mild worsening depressive symptoms in last few wks, no suicidal ideation, or panic; he is ok with start low dose SSRI.  Has some lower appetite, may have lost more than 5 lbs recentl..  Has some persistent pedal edema that goes away at night.  Gets dizzy if stands too quickly in the am. Declines pneumovax today Past Medical History  Diagnosis Date  . ANEMIA-IRON DEFICIENCY 03/09/2008  . DIVERTICULOSIS, COLON 03/09/2008  . FATIGUE 03/09/2008  . GOUT 03/09/2008  . HYPERLIPIDEMIA 03/09/2008  . HYPERTENSION 03/09/2008  . PROSTATE CANCER, HX OF 03/09/2008  . RENAL INSUFFICIENCY 03/09/2008  . SHOULDER PAIN, LEFT 06/03/2010  . Complex renal cyst 06/19/2011  . Hyperparathyroidism   . Cancer     prostate  . Arthritis   . History of blood transfusion    Past Surgical History  Procedure Laterality Date  . Tonsillectomy    . Rotator cuff repair right  4/08  . S/p left hip replacement  2007    Dr. Percell Miller ortho  . Joint replacement      HIP  . Av fistula placement Left 01/24/2013    Procedure: INSERTION OF ARTERIOVENOUS (AV) GORE-TEX GRAFT ARM;  Surgeon: Rosetta Posner,  MD;  Location: [place] [facility];  Service: Vascular;  Laterality: Left;    reports that he quit smoking about 31 years ago. His smoking use included Cigarettes. He smoked 0.00 packs per day for 5 years. He has never used smokeless tobacco. He reports that he does not drink alcohol or use illicit drugs. family history includes Cancer in his father; Diabetes in his father; Hypertension in his brother and father. No Known Allergies Current Outpatient Prescriptions on File Prior to Visit  Medication Sig Dispense Refill  . amLODipine (NORVASC) 10 MG tablet Take 1 tablet (10 mg total) by mouth daily.  90 tablet  3  . aspirin 81 MG tablet Take 81 mg by mouth daily.       Marland Kitchen doxercalciferol (HECTOROL) 0.5 MCG capsule Take 1.5 mcg by mouth daily.       Marland Kitchen atenolol (TENORMIN) 50 MG tablet Take 0.5 tablets (25 mg total) by mouth daily. Take 1/2 tablet daily  90 tablet  3   No current facility-administered medications on file prior to visit.    Review of Systems  Constitutional: Negative for unusual diaphoresis or other sweats  HENT: Negative for ringing in ear Eyes: Negative for double vision or worsening visual disturbance.  Respiratory: Negative for choking and stridor.   Gastrointestinal: Negative for vomiting or other signifcant bowel change Genitourinary: Negative for hematuria or decreased urine volume.  Musculoskeletal: Negative for other  MSK pain or swelling Skin: Negative for color change and worsening wound.  Neurological: Negative for tremors and numbness other than noted  Psychiatric/Behavioral: Negative for decreased concentration or agitation other than above       Objective:   Physical Exam BP 160/70  Pulse 68  Temp(Src) 97.3 F (36.3 C) (Oral)  Ht 5\' 9"  (1.753 m)  Wt 155 lb 6 oz (70.478 kg)  BMI 22.93 kg/m2  SpO2 98% VS noted,  Constitutional: Pt appears well-developed, well-nourished.  HENT: Head: NCAT.  Right Ear: External ear normal.  Left Ear: External ear normal.  Eyes: .  Pupils are equal, round, and reactive to light. Conjunctivae and EOM are normal Neck: Normal range of motion. Neck supple.  Cardiovascular: Normal rate and regular rhythm.   Pulmonary/Chest: Effort normal and breath sounds normal.  Abd:  Soft, NT, ND, + BS Left arm + thrill Spine nontender Neurological: Pt is alert. Not confused , motor grossly intact Skin: Skin is warm. No rash Psychiatric: Pt behavior is normal. No agitation. mild dysphoric     Assessment & Plan:

## 2014-03-26 NOTE — Assessment & Plan Note (Signed)
C/w prob underlying lumbar djd or ddd, for tylenol prn

## 2014-03-26 NOTE — Assessment & Plan Note (Addendum)
Mild elev likely situational today, stable overall by history and exam, recent data reviewed with pt, and pt to continue medical treatment as before,  to f/u any worsening symptoms or concerns BP Readings from Last 3 Encounters:  03/26/14 160/70  03/04/14 138/54  02/04/14 158/58

## 2014-03-26 NOTE — Progress Notes (Signed)
Pre visit review using our clinic review tool, if applicable. No additional management support is needed unless otherwise documented below in the visit note. 

## 2014-03-26 NOTE — Patient Instructions (Signed)
Please take all new medication as prescribed - the citalopram 10 mg per day (for low mood0  Please continue all other medications as before, and refills have been done if requested. Please have the pharmacy call with any other refills you may need.  Please continue your efforts at being more active, low cholesterol diet, and weight control.  You are otherwise up to date with prevention measures today.  Please keep your appointments with your specialists as you have planned  We can hold on more lab work today  Please return if you change your mind about the Pneumonia shot  Please remember to sign up for MyChart if you have not done so, as this will be important to you in the future with finding out test results, communicating by private email, and scheduling acute appointments online when needed.  Please return in 6 months, or sooner if needed

## 2014-04-01 ENCOUNTER — Encounter (HOSPITAL_COMMUNITY)
Admission: RE | Admit: 2014-04-01 | Discharge: 2014-04-01 | Disposition: A | Discharge status: Home / Self Care | Payer: Medicare Other | Source: Ambulatory Visit | Attending: Nephrology | Admitting: Nephrology

## 2014-04-01 DIAGNOSIS — D638 Anemia in other chronic diseases classified elsewhere: Secondary | ICD-10-CM | POA: Diagnosis not present

## 2014-04-01 DIAGNOSIS — N184 Chronic kidney disease, stage 4 (severe): Secondary | ICD-10-CM | POA: Insufficient documentation

## 2014-04-01 LAB — IRON AND TIBC
IRON: 62 ug/dL (ref 42–135)
Saturation Ratios: 23 % (ref 20–55)
TIBC: 264 ug/dL (ref 215–435)
UIBC: 202 ug/dL (ref 125–400)

## 2014-04-01 LAB — POCT HEMOGLOBIN-HEMACUE: HEMOGLOBIN: 11.4 g/dL — AB (ref 13.0–17.0)

## 2014-04-01 LAB — FERRITIN: Ferritin: 542 ng/mL — ABNORMAL HIGH (ref 22–322)

## 2014-04-01 MED ORDER — EPOETIN ALFA 10000 UNIT/ML IJ SOLN
INTRAMUSCULAR | Status: AC
  Administered 2014-04-01: 10000 [IU] via SUBCUTANEOUS
  Filled 2014-04-01: qty 1

## 2014-04-01 MED ORDER — EPOETIN ALFA 20000 UNIT/ML IJ SOLN
INTRAMUSCULAR | Status: AC
  Administered 2014-04-01: 20000 [IU] via SUBCUTANEOUS
  Filled 2014-04-01: qty 1

## 2014-04-01 MED ORDER — EPOETIN ALFA 40000 UNIT/ML IJ SOLN: 30000.0000 [IU] | INTRAMUSCULAR | Status: DC

## 2014-04-08 DIAGNOSIS — C61 Malignant neoplasm of prostate: Secondary | ICD-10-CM | POA: Diagnosis not present

## 2014-04-08 DIAGNOSIS — E291 Testicular hypofunction: Secondary | ICD-10-CM | POA: Diagnosis not present

## 2014-04-10 ENCOUNTER — Other Ambulatory Visit: Payer: Self-pay | Admitting: Internal Medicine

## 2014-04-16 DIAGNOSIS — I129 Hypertensive chronic kidney disease with stage 1 through stage 4 chronic kidney disease, or unspecified chronic kidney disease: Secondary | ICD-10-CM | POA: Diagnosis not present

## 2014-04-16 DIAGNOSIS — N2581 Secondary hyperparathyroidism of renal origin: Secondary | ICD-10-CM | POA: Diagnosis not present

## 2014-04-16 DIAGNOSIS — D631 Anemia in chronic kidney disease: Secondary | ICD-10-CM | POA: Diagnosis not present

## 2014-04-16 DIAGNOSIS — N185 Chronic kidney disease, stage 5: Secondary | ICD-10-CM | POA: Diagnosis not present

## 2014-04-27 DIAGNOSIS — C61 Malignant neoplasm of prostate: Secondary | ICD-10-CM | POA: Diagnosis not present

## 2014-04-27 DIAGNOSIS — B372 Candidiasis of skin and nail: Secondary | ICD-10-CM | POA: Diagnosis not present

## 2014-04-27 DIAGNOSIS — N3941 Urge incontinence: Secondary | ICD-10-CM | POA: Diagnosis not present

## 2014-04-29 ENCOUNTER — Encounter (HOSPITAL_COMMUNITY)
Admission: RE | Admit: 2014-04-29 | Discharge: 2014-04-29 | Disposition: A | Discharge status: Home / Self Care | Payer: Medicare Other | Source: Ambulatory Visit | Attending: Nephrology | Admitting: Nephrology

## 2014-04-29 DIAGNOSIS — D638 Anemia in other chronic diseases classified elsewhere: Secondary | ICD-10-CM | POA: Diagnosis not present

## 2014-04-29 DIAGNOSIS — N184 Chronic kidney disease, stage 4 (severe): Secondary | ICD-10-CM | POA: Diagnosis not present

## 2014-04-29 LAB — FERRITIN: Ferritin: 502 ng/mL — ABNORMAL HIGH (ref 22–322)

## 2014-04-29 LAB — IRON AND TIBC
Iron: 97 ug/dL (ref 42–135)
Saturation Ratios: 38 % (ref 20–55)
TIBC: 258 ug/dL (ref 215–435)
UIBC: 161 ug/dL (ref 125–400)

## 2014-04-29 LAB — POCT HEMOGLOBIN-HEMACUE: HEMOGLOBIN: 11.2 g/dL — AB (ref 13.0–17.0)

## 2014-04-29 MED ORDER — EPOETIN ALFA 10000 UNIT/ML IJ SOLN
INTRAMUSCULAR | Status: AC
  Administered 2014-04-29: 10000 [IU] via SUBCUTANEOUS
  Filled 2014-04-29: qty 1

## 2014-04-29 MED ORDER — EPOETIN ALFA 20000 UNIT/ML IJ SOLN
INTRAMUSCULAR | Status: AC
  Administered 2014-04-29: 20000 [IU] via SUBCUTANEOUS
  Filled 2014-04-29: qty 1

## 2014-04-29 MED ORDER — EPOETIN ALFA 40000 UNIT/ML IJ SOLN: 30000.0000 [IU] | INTRAMUSCULAR | Status: DC

## 2014-05-11 DIAGNOSIS — N2581 Secondary hyperparathyroidism of renal origin: Secondary | ICD-10-CM | POA: Diagnosis not present

## 2014-05-11 DIAGNOSIS — I129 Hypertensive chronic kidney disease with stage 1 through stage 4 chronic kidney disease, or unspecified chronic kidney disease: Secondary | ICD-10-CM | POA: Diagnosis not present

## 2014-05-11 DIAGNOSIS — D631 Anemia in chronic kidney disease: Secondary | ICD-10-CM | POA: Diagnosis not present

## 2014-05-11 DIAGNOSIS — N185 Chronic kidney disease, stage 5: Secondary | ICD-10-CM | POA: Diagnosis not present

## 2014-05-13 ENCOUNTER — Other Ambulatory Visit: Payer: Self-pay

## 2014-05-13 MED ORDER — CITALOPRAM HYDROBROMIDE 10 MG PO TABS: 10.0000 mg | ORAL_TABLET | Freq: Every day | ORAL | Status: DC

## 2014-05-13 MED ORDER — ROSUVASTATIN CALCIUM 20 MG PO TABS: 20.0000 mg | ORAL_TABLET | Freq: Every day | ORAL | Status: DC

## 2014-05-13 NOTE — Telephone Encounter (Signed)
Refill request for crestor 20 mg, rx sent into pharm

## 2014-05-14 ENCOUNTER — Other Ambulatory Visit: Payer: Self-pay

## 2014-05-14 MED ORDER — CITALOPRAM HYDROBROMIDE 10 MG PO TABS: 10.0000 mg | ORAL_TABLET | Freq: Every day | ORAL | Status: DC

## 2014-05-27 ENCOUNTER — Encounter (HOSPITAL_COMMUNITY)
Admission: RE | Admit: 2014-05-27 | Discharge: 2014-05-27 | Disposition: A | Discharge status: Home / Self Care | Payer: Medicare Other | Source: Ambulatory Visit | Attending: Nephrology | Admitting: Nephrology

## 2014-05-27 DIAGNOSIS — N184 Chronic kidney disease, stage 4 (severe): Secondary | ICD-10-CM | POA: Insufficient documentation

## 2014-05-27 DIAGNOSIS — D638 Anemia in other chronic diseases classified elsewhere: Secondary | ICD-10-CM | POA: Insufficient documentation

## 2014-05-27 LAB — POCT HEMOGLOBIN-HEMACUE: Hemoglobin: 11.1 g/dL — ABNORMAL LOW (ref 13.0–17.0)

## 2014-05-27 LAB — IRON AND TIBC
Iron: 85 ug/dL (ref 42–135)
SATURATION RATIOS: 31 % (ref 20–55)
TIBC: 271 ug/dL (ref 215–435)
UIBC: 186 ug/dL (ref 125–400)

## 2014-05-27 LAB — FERRITIN: Ferritin: 630 ng/mL — ABNORMAL HIGH (ref 22–322)

## 2014-05-27 MED ORDER — EPOETIN ALFA 40000 UNIT/ML IJ SOLN: 30000.0000 [IU] | INTRAMUSCULAR | Status: DC

## 2014-05-27 MED ORDER — EPOETIN ALFA 20000 UNIT/ML IJ SOLN
INTRAMUSCULAR | Status: AC
  Administered 2014-05-27: 20000 [IU]
  Filled 2014-05-27: qty 1

## 2014-05-27 MED ORDER — EPOETIN ALFA 10000 UNIT/ML IJ SOLN
INTRAMUSCULAR | Status: AC
  Administered 2014-05-27: 10000 [IU]
  Filled 2014-05-27: qty 1

## 2014-06-23 DIAGNOSIS — N2581 Secondary hyperparathyroidism of renal origin: Secondary | ICD-10-CM | POA: Diagnosis not present

## 2014-06-23 DIAGNOSIS — D631 Anemia in chronic kidney disease: Secondary | ICD-10-CM | POA: Diagnosis not present

## 2014-06-23 DIAGNOSIS — N185 Chronic kidney disease, stage 5: Secondary | ICD-10-CM | POA: Diagnosis not present

## 2014-06-23 DIAGNOSIS — I120 Hypertensive chronic kidney disease with stage 5 chronic kidney disease or end stage renal disease: Secondary | ICD-10-CM | POA: Diagnosis not present

## 2014-06-24 ENCOUNTER — Encounter (HOSPITAL_COMMUNITY)
Admission: RE | Admit: 2014-06-24 | Discharge: 2014-06-24 | Disposition: A | Discharge status: Home / Self Care | Payer: Medicare Other | Source: Ambulatory Visit | Attending: Nephrology | Admitting: Nephrology

## 2014-06-24 DIAGNOSIS — D638 Anemia in other chronic diseases classified elsewhere: Secondary | ICD-10-CM | POA: Diagnosis not present

## 2014-06-24 DIAGNOSIS — N184 Chronic kidney disease, stage 4 (severe): Secondary | ICD-10-CM | POA: Diagnosis not present

## 2014-06-24 LAB — IRON AND TIBC
Iron: 106 ug/dL (ref 42–135)
Saturation Ratios: 39 % (ref 20–55)
TIBC: 271 ug/dL (ref 215–435)
UIBC: 165 ug/dL (ref 125–400)

## 2014-06-24 LAB — FERRITIN: FERRITIN: 626 ng/mL — AB (ref 22–322)

## 2014-06-24 MED ORDER — EPOETIN ALFA 10000 UNIT/ML IJ SOLN
INTRAMUSCULAR | Status: AC
  Administered 2014-06-24: 10000 [IU] via SUBCUTANEOUS
  Filled 2014-06-24: qty 1

## 2014-06-24 MED ORDER — EPOETIN ALFA 20000 UNIT/ML IJ SOLN
INTRAMUSCULAR | Status: AC
  Administered 2014-06-24: 20000 [IU] via SUBCUTANEOUS
  Filled 2014-06-24: qty 1

## 2014-06-24 MED ORDER — EPOETIN ALFA 40000 UNIT/ML IJ SOLN: 30000.0000 [IU] | INTRAMUSCULAR | Status: DC

## 2014-07-22 ENCOUNTER — Encounter (HOSPITAL_COMMUNITY)
Admission: RE | Admit: 2014-07-22 | Discharge: 2014-07-22 | Disposition: A | Discharge status: Home / Self Care | Payer: Medicare Other | Source: Ambulatory Visit | Attending: Nephrology | Admitting: Nephrology

## 2014-07-22 DIAGNOSIS — D638 Anemia in other chronic diseases classified elsewhere: Secondary | ICD-10-CM | POA: Diagnosis not present

## 2014-07-22 DIAGNOSIS — N184 Chronic kidney disease, stage 4 (severe): Secondary | ICD-10-CM | POA: Insufficient documentation

## 2014-07-22 LAB — POCT HEMOGLOBIN-HEMACUE: Hemoglobin: 10.8 g/dL — ABNORMAL LOW (ref 13.0–17.0)

## 2014-07-22 MED ORDER — EPOETIN ALFA 20000 UNIT/ML IJ SOLN
INTRAMUSCULAR | Status: AC
  Administered 2014-07-22: 20000 [IU] via SUBCUTANEOUS
  Filled 2014-07-22: qty 1

## 2014-07-22 MED ORDER — EPOETIN ALFA 40000 UNIT/ML IJ SOLN: 30000.0000 [IU] | INTRAMUSCULAR | Status: DC

## 2014-07-22 MED ORDER — EPOETIN ALFA 10000 UNIT/ML IJ SOLN
INTRAMUSCULAR | Status: AC
  Administered 2014-07-22: 10000 [IU] via SUBCUTANEOUS
  Filled 2014-07-22: qty 1

## 2014-07-30 DIAGNOSIS — C61 Malignant neoplasm of prostate: Secondary | ICD-10-CM | POA: Diagnosis not present

## 2014-08-04 DIAGNOSIS — I120 Hypertensive chronic kidney disease with stage 5 chronic kidney disease or end stage renal disease: Secondary | ICD-10-CM | POA: Diagnosis not present

## 2014-08-04 DIAGNOSIS — N2581 Secondary hyperparathyroidism of renal origin: Secondary | ICD-10-CM | POA: Diagnosis not present

## 2014-08-04 DIAGNOSIS — D631 Anemia in chronic kidney disease: Secondary | ICD-10-CM | POA: Diagnosis not present

## 2014-08-04 DIAGNOSIS — N039 Chronic nephritic syndrome with unspecified morphologic changes: Secondary | ICD-10-CM | POA: Diagnosis not present

## 2014-08-04 DIAGNOSIS — N390 Urinary tract infection, site not specified: Secondary | ICD-10-CM | POA: Diagnosis not present

## 2014-08-04 DIAGNOSIS — N185 Chronic kidney disease, stage 5: Secondary | ICD-10-CM | POA: Diagnosis not present

## 2014-08-06 DIAGNOSIS — N3941 Urge incontinence: Secondary | ICD-10-CM | POA: Diagnosis not present

## 2014-08-06 DIAGNOSIS — C61 Malignant neoplasm of prostate: Secondary | ICD-10-CM | POA: Diagnosis not present

## 2014-08-06 DIAGNOSIS — B372 Candidiasis of skin and nail: Secondary | ICD-10-CM | POA: Diagnosis not present

## 2014-08-19 ENCOUNTER — Encounter (HOSPITAL_COMMUNITY)
Admission: RE | Admit: 2014-08-19 | Discharge: 2014-08-19 | Disposition: A | Discharge status: Home / Self Care | Payer: Medicare Other | Source: Ambulatory Visit | Attending: Nephrology | Admitting: Nephrology

## 2014-08-19 DIAGNOSIS — D631 Anemia in chronic kidney disease: Secondary | ICD-10-CM | POA: Insufficient documentation

## 2014-08-19 DIAGNOSIS — N184 Chronic kidney disease, stage 4 (severe): Secondary | ICD-10-CM | POA: Diagnosis not present

## 2014-08-19 LAB — IRON AND TIBC
Iron: 105 ug/dL (ref 42–135)
SATURATION RATIOS: 42 % (ref 20–55)
TIBC: 253 ug/dL (ref 215–435)
UIBC: 148 ug/dL (ref 125–400)

## 2014-08-19 LAB — POCT HEMOGLOBIN-HEMACUE: HEMOGLOBIN: 10.9 g/dL — AB (ref 13.0–17.0)

## 2014-08-19 LAB — FERRITIN: Ferritin: 717 ng/mL — ABNORMAL HIGH (ref 22–322)

## 2014-08-19 MED ORDER — EPOETIN ALFA 20000 UNIT/ML IJ SOLN
INTRAMUSCULAR | Status: AC
  Administered 2014-08-19: 20000 [IU] via SUBCUTANEOUS
  Filled 2014-08-19: qty 1

## 2014-08-19 MED ORDER — EPOETIN ALFA 10000 UNIT/ML IJ SOLN
INTRAMUSCULAR | Status: AC
  Administered 2014-08-19: 10000 [IU] via SUBCUTANEOUS
  Filled 2014-08-19: qty 1

## 2014-08-19 MED ORDER — EPOETIN ALFA 40000 UNIT/ML IJ SOLN: 30000.0000 [IU] | INTRAMUSCULAR | Status: DC

## 2014-09-07 DIAGNOSIS — I120 Hypertensive chronic kidney disease with stage 5 chronic kidney disease or end stage renal disease: Secondary | ICD-10-CM | POA: Diagnosis not present

## 2014-09-07 DIAGNOSIS — D631 Anemia in chronic kidney disease: Secondary | ICD-10-CM | POA: Diagnosis not present

## 2014-09-07 DIAGNOSIS — N2581 Secondary hyperparathyroidism of renal origin: Secondary | ICD-10-CM | POA: Diagnosis not present

## 2014-09-07 DIAGNOSIS — N185 Chronic kidney disease, stage 5: Secondary | ICD-10-CM | POA: Diagnosis not present

## 2014-09-11 DIAGNOSIS — E213 Hyperparathyroidism, unspecified: Secondary | ICD-10-CM | POA: Diagnosis not present

## 2014-09-11 DIAGNOSIS — D509 Iron deficiency anemia, unspecified: Secondary | ICD-10-CM | POA: Diagnosis not present

## 2014-09-11 DIAGNOSIS — D649 Anemia, unspecified: Secondary | ICD-10-CM | POA: Diagnosis not present

## 2014-09-11 DIAGNOSIS — N186 End stage renal disease: Secondary | ICD-10-CM | POA: Diagnosis not present

## 2014-09-14 DIAGNOSIS — E213 Hyperparathyroidism, unspecified: Secondary | ICD-10-CM | POA: Diagnosis not present

## 2014-09-14 DIAGNOSIS — N186 End stage renal disease: Secondary | ICD-10-CM | POA: Diagnosis not present

## 2014-09-14 DIAGNOSIS — D649 Anemia, unspecified: Secondary | ICD-10-CM | POA: Diagnosis not present

## 2014-09-14 DIAGNOSIS — D509 Iron deficiency anemia, unspecified: Secondary | ICD-10-CM | POA: Diagnosis not present

## 2014-09-16 ENCOUNTER — Encounter (HOSPITAL_COMMUNITY): Payer: Medicare Other

## 2014-09-16 DIAGNOSIS — D649 Anemia, unspecified: Secondary | ICD-10-CM | POA: Diagnosis not present

## 2014-09-16 DIAGNOSIS — N186 End stage renal disease: Secondary | ICD-10-CM | POA: Diagnosis not present

## 2014-09-16 DIAGNOSIS — D509 Iron deficiency anemia, unspecified: Secondary | ICD-10-CM | POA: Diagnosis not present

## 2014-09-16 DIAGNOSIS — E213 Hyperparathyroidism, unspecified: Secondary | ICD-10-CM | POA: Diagnosis not present

## 2014-09-18 DIAGNOSIS — E213 Hyperparathyroidism, unspecified: Secondary | ICD-10-CM | POA: Diagnosis not present

## 2014-09-18 DIAGNOSIS — N186 End stage renal disease: Secondary | ICD-10-CM | POA: Diagnosis not present

## 2014-09-18 DIAGNOSIS — D509 Iron deficiency anemia, unspecified: Secondary | ICD-10-CM | POA: Diagnosis not present

## 2014-09-18 DIAGNOSIS — D649 Anemia, unspecified: Secondary | ICD-10-CM | POA: Diagnosis not present

## 2014-09-21 DIAGNOSIS — D649 Anemia, unspecified: Secondary | ICD-10-CM | POA: Diagnosis not present

## 2014-09-21 DIAGNOSIS — N186 End stage renal disease: Secondary | ICD-10-CM | POA: Diagnosis not present

## 2014-09-21 DIAGNOSIS — E213 Hyperparathyroidism, unspecified: Secondary | ICD-10-CM | POA: Diagnosis not present

## 2014-09-21 DIAGNOSIS — D509 Iron deficiency anemia, unspecified: Secondary | ICD-10-CM | POA: Diagnosis not present

## 2014-09-23 DIAGNOSIS — E213 Hyperparathyroidism, unspecified: Secondary | ICD-10-CM | POA: Diagnosis not present

## 2014-09-23 DIAGNOSIS — D649 Anemia, unspecified: Secondary | ICD-10-CM | POA: Diagnosis not present

## 2014-09-23 DIAGNOSIS — N186 End stage renal disease: Secondary | ICD-10-CM | POA: Diagnosis not present

## 2014-09-23 DIAGNOSIS — D509 Iron deficiency anemia, unspecified: Secondary | ICD-10-CM | POA: Diagnosis not present

## 2014-09-25 DIAGNOSIS — E213 Hyperparathyroidism, unspecified: Secondary | ICD-10-CM | POA: Diagnosis not present

## 2014-09-25 DIAGNOSIS — D649 Anemia, unspecified: Secondary | ICD-10-CM | POA: Diagnosis not present

## 2014-09-25 DIAGNOSIS — N186 End stage renal disease: Secondary | ICD-10-CM | POA: Diagnosis not present

## 2014-09-25 DIAGNOSIS — D509 Iron deficiency anemia, unspecified: Secondary | ICD-10-CM | POA: Diagnosis not present

## 2014-09-27 DIAGNOSIS — E213 Hyperparathyroidism, unspecified: Secondary | ICD-10-CM | POA: Diagnosis not present

## 2014-09-27 DIAGNOSIS — D509 Iron deficiency anemia, unspecified: Secondary | ICD-10-CM | POA: Diagnosis not present

## 2014-09-27 DIAGNOSIS — N186 End stage renal disease: Secondary | ICD-10-CM | POA: Diagnosis not present

## 2014-09-27 DIAGNOSIS — D649 Anemia, unspecified: Secondary | ICD-10-CM | POA: Diagnosis not present

## 2014-09-29 ENCOUNTER — Ambulatory Visit (INDEPENDENT_AMBULATORY_CARE_PROVIDER_SITE_OTHER): Payer: Medicare Other | Admitting: Internal Medicine

## 2014-09-29 ENCOUNTER — Encounter: Payer: Self-pay | Admitting: Internal Medicine

## 2014-09-29 VITALS — BP 140/62 | HR 66 | Temp 98.0°F | Ht 69.0 in | Wt 153.8 lb

## 2014-09-29 DIAGNOSIS — F329 Major depressive disorder, single episode, unspecified: Secondary | ICD-10-CM | POA: Diagnosis not present

## 2014-09-29 DIAGNOSIS — E213 Hyperparathyroidism, unspecified: Secondary | ICD-10-CM | POA: Diagnosis not present

## 2014-09-29 DIAGNOSIS — N186 End stage renal disease: Secondary | ICD-10-CM

## 2014-09-29 DIAGNOSIS — E785 Hyperlipidemia, unspecified: Secondary | ICD-10-CM

## 2014-09-29 DIAGNOSIS — I10 Essential (primary) hypertension: Secondary | ICD-10-CM

## 2014-09-29 DIAGNOSIS — F32A Depression, unspecified: Secondary | ICD-10-CM

## 2014-09-29 DIAGNOSIS — D649 Anemia, unspecified: Secondary | ICD-10-CM | POA: Diagnosis not present

## 2014-09-29 DIAGNOSIS — D509 Iron deficiency anemia, unspecified: Secondary | ICD-10-CM | POA: Diagnosis not present

## 2014-09-29 NOTE — Progress Notes (Signed)
Subjective:    Patient ID: [name], male    DOB: [birth year], 80 y.o.   MRN: [id number]  HPI Here to f/u; overall doing ok,  Pt denies chest pain, increased sob or doe, wheezing, orthopnea, PND, increased LE swelling, palpitations, dizziness or syncope.  Pt denies polydipsia, polyuria, or low sugar symptoms such as weakness or confusion improved with po intake.  Pt denies new neurological symptoms such as new headache, or facial or extremity weakness or numbness.   Pt states overall good compliance with meds, has been trying to follow lower cholesterol diet, with wt overall stable,  but little exercise however. Also hx of ESRD, Now on HD new for the past month. Usual HD on mon-wed-fri, declines flu shot, and pneumnoia shots. Denies worsening depressive symptoms, suicidal ideation, or panic Past Medical History  Diagnosis Date  . ANEMIA-IRON DEFICIENCY 03/09/2008  . DIVERTICULOSIS, COLON 03/09/2008  . FATIGUE 03/09/2008  . GOUT 03/09/2008  . HYPERLIPIDEMIA 03/09/2008  . HYPERTENSION 03/09/2008  . PROSTATE CANCER, HX OF 03/09/2008  . RENAL INSUFFICIENCY 03/09/2008  . SHOULDER PAIN, LEFT 06/03/2010  . Complex renal cyst 06/19/2011  . Hyperparathyroidism   . Cancer     prostate  . Arthritis   . History of blood transfusion   . Depression 03/26/2014   Past Surgical History  Procedure Laterality Date  . Tonsillectomy    . Rotator cuff repair right  4/08  . S/p left hip replacement  2007    Dr. Percell Miller ortho  . Joint replacement      HIP  . Av fistula placement Left 01/24/2013    Procedure: INSERTION OF ARTERIOVENOUS (AV) GORE-TEX GRAFT ARM;  Surgeon: Rosetta Posner, MD;  Location: [place];  Service: Vascular;  Laterality: Left;    reports that he quit smoking about 32 years ago. His smoking use included Cigarettes. He smoked 0.00 packs per day for 5 years. He has never used smokeless tobacco. He reports that he does not drink alcohol or use illicit drugs. family history includes Cancer in his father;  Diabetes in his father; Hypertension in his brother and father. No Known Allergies  Review of Systems  Constitutional: Negative for unusual diaphoresis or other sweats  HENT: Negative for ringing in ear Eyes: Negative for double vision or worsening visual disturbance.  Respiratory: Negative for choking and stridor.   Gastrointestinal: Negative for vomiting or other signifcant bowel change Genitourinary: Negative for hematuria or decreased urine volume.  Musculoskeletal: Negative for other MSK pain or swelling Skin: Negative for color change and worsening wound.  Neurological: Negative for tremors and numbness other than noted  Psychiatric/Behavioral: Negative for decreased concentration or agitation other than above  '     Objective:   Physical Exam BP 140/62 mmHg  Pulse 66  Temp(Src) 98 F (36.7 C) (Oral)  Ht 5\' 9"  (1.753 m)  Wt 153 lb 12 oz (69.741 kg)  BMI 22.69 kg/m2  SpO2 93% VS noted,  Constitutional: Pt appears well-developed, well-nourished.but more gaunt than previous  HENT: Head: NCAT.  Right Ear: External ear normal.  Left Ear: External ear normal.  Eyes: . Pupils are equal, round, and reactive to light. Conjunctivae and EOM are normal Neck: Normal range of motion. Neck supple.  Cardiovascular: Normal rate and regular rhythm.   Pulmonary/Chest: Effort normal and breath sounds normal.  Abd:  Soft, NT, ND, + BS Neurological: Pt is alert. Not confused , motor grossly intact Skin: Skin is warm. No rash Psychiatric: Pt behavior  is normal. No agitation. not depressed affect + thrill LUE     Assessment & Plan:

## 2014-09-29 NOTE — Patient Instructions (Signed)
Please continue all other medications as before, and refills have been done if requested.  Please have the pharmacy call with any other refills you may need.  Please continue your efforts at being more active, low cholesterol diet, and weight control.  You are otherwise up to date with prevention measures today.  Please keep your appointments with your specialists as you may have planned  No lab work felt needed today  Please return in 6 months, or sooner if needed

## 2014-09-29 NOTE — Progress Notes (Signed)
Pre visit review using our clinic review tool, if applicable. No additional management support is needed unless otherwise documented below in the visit note. 

## 2014-09-30 NOTE — Assessment & Plan Note (Signed)
stable overall by history and exam, recent data reviewed with pt, and pt to continue medical treatment as before,  to f/u any worsening symptoms or concerns Lab Results  Component Value Date   WBC 6.2 03/17/2013   HGB 10.9* 08/19/2014   HCT 29.0* 03/17/2013   PLT 232.0 03/17/2013   GLUCOSE 111* 01/31/2013   CHOL 184 07/26/2012   TRIG 141.0 07/26/2012   HDL 59.30 07/26/2012   LDLCALC 97 07/26/2012   ALT 11 07/26/2012   AST 16 07/26/2012   NA 139 01/31/2013   K 5.2* 01/31/2013   CL 110 01/31/2013   CREATININE 4.80* 01/31/2013   BUN 75* 01/31/2013   CO2 25 07/26/2012   TSH 0.83 07/26/2012

## 2014-09-30 NOTE — Assessment & Plan Note (Signed)
stable overall by history and exam, recent data reviewed with pt, and pt to continue medical treatment as before,  to f/u any worsening symptoms or concerns BP Readings from Last 3 Encounters:  09/29/14 140/62  08/19/14 145/55  07/22/14 176/67

## 2014-09-30 NOTE — Assessment & Plan Note (Signed)
To cont HD as scheduled

## 2014-09-30 NOTE — Assessment & Plan Note (Signed)
stable overall by history and exam, recent data reviewed with pt, and pt to continue medical treatment as before,  to f/u any worsening symptoms or concerns Lab Results  Component Value Date   LDLCALC 97 07/26/2012

## 2014-10-02 DIAGNOSIS — N186 End stage renal disease: Secondary | ICD-10-CM | POA: Diagnosis not present

## 2014-10-02 DIAGNOSIS — E213 Hyperparathyroidism, unspecified: Secondary | ICD-10-CM | POA: Diagnosis not present

## 2014-10-02 DIAGNOSIS — D509 Iron deficiency anemia, unspecified: Secondary | ICD-10-CM | POA: Diagnosis not present

## 2014-10-02 DIAGNOSIS — D649 Anemia, unspecified: Secondary | ICD-10-CM | POA: Diagnosis not present

## 2014-10-05 ENCOUNTER — Other Ambulatory Visit: Payer: Self-pay | Admitting: Internal Medicine

## 2014-10-05 DIAGNOSIS — N186 End stage renal disease: Secondary | ICD-10-CM | POA: Diagnosis not present

## 2014-10-05 DIAGNOSIS — D649 Anemia, unspecified: Secondary | ICD-10-CM | POA: Diagnosis not present

## 2014-10-05 DIAGNOSIS — E213 Hyperparathyroidism, unspecified: Secondary | ICD-10-CM | POA: Diagnosis not present

## 2014-10-05 DIAGNOSIS — D509 Iron deficiency anemia, unspecified: Secondary | ICD-10-CM | POA: Diagnosis not present

## 2014-10-05 DIAGNOSIS — Z992 Dependence on renal dialysis: Secondary | ICD-10-CM | POA: Diagnosis not present

## 2014-10-07 DIAGNOSIS — N2581 Secondary hyperparathyroidism of renal origin: Secondary | ICD-10-CM | POA: Diagnosis not present

## 2014-10-07 DIAGNOSIS — E213 Hyperparathyroidism, unspecified: Secondary | ICD-10-CM | POA: Diagnosis not present

## 2014-10-07 DIAGNOSIS — N186 End stage renal disease: Secondary | ICD-10-CM | POA: Diagnosis not present

## 2014-10-07 DIAGNOSIS — D509 Iron deficiency anemia, unspecified: Secondary | ICD-10-CM | POA: Diagnosis not present

## 2014-10-07 DIAGNOSIS — D649 Anemia, unspecified: Secondary | ICD-10-CM | POA: Diagnosis not present

## 2014-10-09 DIAGNOSIS — D649 Anemia, unspecified: Secondary | ICD-10-CM | POA: Diagnosis not present

## 2014-10-09 DIAGNOSIS — N2581 Secondary hyperparathyroidism of renal origin: Secondary | ICD-10-CM | POA: Diagnosis not present

## 2014-10-09 DIAGNOSIS — N186 End stage renal disease: Secondary | ICD-10-CM | POA: Diagnosis not present

## 2014-10-09 DIAGNOSIS — E213 Hyperparathyroidism, unspecified: Secondary | ICD-10-CM | POA: Diagnosis not present

## 2014-10-09 DIAGNOSIS — D509 Iron deficiency anemia, unspecified: Secondary | ICD-10-CM | POA: Diagnosis not present

## 2014-10-12 DIAGNOSIS — D649 Anemia, unspecified: Secondary | ICD-10-CM | POA: Diagnosis not present

## 2014-10-12 DIAGNOSIS — N186 End stage renal disease: Secondary | ICD-10-CM | POA: Diagnosis not present

## 2014-10-12 DIAGNOSIS — N2581 Secondary hyperparathyroidism of renal origin: Secondary | ICD-10-CM | POA: Diagnosis not present

## 2014-10-12 DIAGNOSIS — E213 Hyperparathyroidism, unspecified: Secondary | ICD-10-CM | POA: Diagnosis not present

## 2014-10-12 DIAGNOSIS — D509 Iron deficiency anemia, unspecified: Secondary | ICD-10-CM | POA: Diagnosis not present

## 2014-10-13 ENCOUNTER — Telehealth: Payer: Self-pay | Admitting: *Deleted

## 2014-10-13 NOTE — Telephone Encounter (Signed)
Left msg on triage wanting to know if husband need to continue taking the allopurinol & wanting a updated med list mail to address. Called wife back no answer LMOM per chart pt suppose to be taking allopurinol 100 mg once a day. Rx was given back in May for # 90 w/3 refills. Mailed med list.../lmb

## 2014-10-14 ENCOUNTER — Other Ambulatory Visit: Payer: Self-pay | Admitting: Internal Medicine

## 2014-10-14 DIAGNOSIS — D509 Iron deficiency anemia, unspecified: Secondary | ICD-10-CM | POA: Diagnosis not present

## 2014-10-14 DIAGNOSIS — D649 Anemia, unspecified: Secondary | ICD-10-CM | POA: Diagnosis not present

## 2014-10-14 DIAGNOSIS — N186 End stage renal disease: Secondary | ICD-10-CM | POA: Diagnosis not present

## 2014-10-14 DIAGNOSIS — N2581 Secondary hyperparathyroidism of renal origin: Secondary | ICD-10-CM | POA: Diagnosis not present

## 2014-10-14 DIAGNOSIS — E213 Hyperparathyroidism, unspecified: Secondary | ICD-10-CM | POA: Diagnosis not present

## 2014-10-16 DIAGNOSIS — N186 End stage renal disease: Secondary | ICD-10-CM | POA: Diagnosis not present

## 2014-10-16 DIAGNOSIS — D649 Anemia, unspecified: Secondary | ICD-10-CM | POA: Diagnosis not present

## 2014-10-16 DIAGNOSIS — E213 Hyperparathyroidism, unspecified: Secondary | ICD-10-CM | POA: Diagnosis not present

## 2014-10-16 DIAGNOSIS — N2581 Secondary hyperparathyroidism of renal origin: Secondary | ICD-10-CM | POA: Diagnosis not present

## 2014-10-16 DIAGNOSIS — D509 Iron deficiency anemia, unspecified: Secondary | ICD-10-CM | POA: Diagnosis not present

## 2014-10-19 DIAGNOSIS — D649 Anemia, unspecified: Secondary | ICD-10-CM | POA: Diagnosis not present

## 2014-10-19 DIAGNOSIS — N2581 Secondary hyperparathyroidism of renal origin: Secondary | ICD-10-CM | POA: Diagnosis not present

## 2014-10-19 DIAGNOSIS — N186 End stage renal disease: Secondary | ICD-10-CM | POA: Diagnosis not present

## 2014-10-19 DIAGNOSIS — E213 Hyperparathyroidism, unspecified: Secondary | ICD-10-CM | POA: Diagnosis not present

## 2014-10-19 DIAGNOSIS — D509 Iron deficiency anemia, unspecified: Secondary | ICD-10-CM | POA: Diagnosis not present

## 2014-10-21 DIAGNOSIS — D509 Iron deficiency anemia, unspecified: Secondary | ICD-10-CM | POA: Diagnosis not present

## 2014-10-21 DIAGNOSIS — N2581 Secondary hyperparathyroidism of renal origin: Secondary | ICD-10-CM | POA: Diagnosis not present

## 2014-10-21 DIAGNOSIS — D649 Anemia, unspecified: Secondary | ICD-10-CM | POA: Diagnosis not present

## 2014-10-21 DIAGNOSIS — N186 End stage renal disease: Secondary | ICD-10-CM | POA: Diagnosis not present

## 2014-10-21 DIAGNOSIS — E213 Hyperparathyroidism, unspecified: Secondary | ICD-10-CM | POA: Diagnosis not present

## 2014-10-23 DIAGNOSIS — E213 Hyperparathyroidism, unspecified: Secondary | ICD-10-CM | POA: Diagnosis not present

## 2014-10-23 DIAGNOSIS — D509 Iron deficiency anemia, unspecified: Secondary | ICD-10-CM | POA: Diagnosis not present

## 2014-10-23 DIAGNOSIS — D649 Anemia, unspecified: Secondary | ICD-10-CM | POA: Diagnosis not present

## 2014-10-23 DIAGNOSIS — N186 End stage renal disease: Secondary | ICD-10-CM | POA: Diagnosis not present

## 2014-10-23 DIAGNOSIS — N2581 Secondary hyperparathyroidism of renal origin: Secondary | ICD-10-CM | POA: Diagnosis not present

## 2014-10-26 DIAGNOSIS — N2581 Secondary hyperparathyroidism of renal origin: Secondary | ICD-10-CM | POA: Diagnosis not present

## 2014-10-26 DIAGNOSIS — D509 Iron deficiency anemia, unspecified: Secondary | ICD-10-CM | POA: Diagnosis not present

## 2014-10-26 DIAGNOSIS — D649 Anemia, unspecified: Secondary | ICD-10-CM | POA: Diagnosis not present

## 2014-10-26 DIAGNOSIS — E213 Hyperparathyroidism, unspecified: Secondary | ICD-10-CM | POA: Diagnosis not present

## 2014-10-26 DIAGNOSIS — N186 End stage renal disease: Secondary | ICD-10-CM | POA: Diagnosis not present

## 2014-10-28 DIAGNOSIS — N186 End stage renal disease: Secondary | ICD-10-CM | POA: Diagnosis not present

## 2014-10-28 DIAGNOSIS — E213 Hyperparathyroidism, unspecified: Secondary | ICD-10-CM | POA: Diagnosis not present

## 2014-10-28 DIAGNOSIS — D649 Anemia, unspecified: Secondary | ICD-10-CM | POA: Diagnosis not present

## 2014-10-28 DIAGNOSIS — D509 Iron deficiency anemia, unspecified: Secondary | ICD-10-CM | POA: Diagnosis not present

## 2014-10-28 DIAGNOSIS — N2581 Secondary hyperparathyroidism of renal origin: Secondary | ICD-10-CM | POA: Diagnosis not present

## 2014-10-31 DIAGNOSIS — N186 End stage renal disease: Secondary | ICD-10-CM | POA: Diagnosis not present

## 2014-10-31 DIAGNOSIS — E213 Hyperparathyroidism, unspecified: Secondary | ICD-10-CM | POA: Diagnosis not present

## 2014-10-31 DIAGNOSIS — D649 Anemia, unspecified: Secondary | ICD-10-CM | POA: Diagnosis not present

## 2014-10-31 DIAGNOSIS — N2581 Secondary hyperparathyroidism of renal origin: Secondary | ICD-10-CM | POA: Diagnosis not present

## 2014-10-31 DIAGNOSIS — D509 Iron deficiency anemia, unspecified: Secondary | ICD-10-CM | POA: Diagnosis not present

## 2014-11-02 DIAGNOSIS — N2581 Secondary hyperparathyroidism of renal origin: Secondary | ICD-10-CM | POA: Diagnosis not present

## 2014-11-02 DIAGNOSIS — N186 End stage renal disease: Secondary | ICD-10-CM | POA: Diagnosis not present

## 2014-11-02 DIAGNOSIS — E213 Hyperparathyroidism, unspecified: Secondary | ICD-10-CM | POA: Diagnosis not present

## 2014-11-02 DIAGNOSIS — D649 Anemia, unspecified: Secondary | ICD-10-CM | POA: Diagnosis not present

## 2014-11-02 DIAGNOSIS — D509 Iron deficiency anemia, unspecified: Secondary | ICD-10-CM | POA: Diagnosis not present

## 2014-11-04 DIAGNOSIS — E213 Hyperparathyroidism, unspecified: Secondary | ICD-10-CM | POA: Diagnosis not present

## 2014-11-04 DIAGNOSIS — D509 Iron deficiency anemia, unspecified: Secondary | ICD-10-CM | POA: Diagnosis not present

## 2014-11-04 DIAGNOSIS — N2581 Secondary hyperparathyroidism of renal origin: Secondary | ICD-10-CM | POA: Diagnosis not present

## 2014-11-04 DIAGNOSIS — D649 Anemia, unspecified: Secondary | ICD-10-CM | POA: Diagnosis not present

## 2014-11-04 DIAGNOSIS — N186 End stage renal disease: Secondary | ICD-10-CM | POA: Diagnosis not present

## 2014-11-05 DIAGNOSIS — Z992 Dependence on renal dialysis: Secondary | ICD-10-CM | POA: Diagnosis not present

## 2014-11-05 DIAGNOSIS — N186 End stage renal disease: Secondary | ICD-10-CM | POA: Diagnosis not present

## 2014-11-07 DIAGNOSIS — N2581 Secondary hyperparathyroidism of renal origin: Secondary | ICD-10-CM | POA: Diagnosis not present

## 2014-11-07 DIAGNOSIS — D509 Iron deficiency anemia, unspecified: Secondary | ICD-10-CM | POA: Diagnosis not present

## 2014-11-07 DIAGNOSIS — N186 End stage renal disease: Secondary | ICD-10-CM | POA: Diagnosis not present

## 2014-11-07 DIAGNOSIS — D649 Anemia, unspecified: Secondary | ICD-10-CM | POA: Diagnosis not present

## 2014-11-09 DIAGNOSIS — N2581 Secondary hyperparathyroidism of renal origin: Secondary | ICD-10-CM | POA: Diagnosis not present

## 2014-11-09 DIAGNOSIS — D509 Iron deficiency anemia, unspecified: Secondary | ICD-10-CM | POA: Diagnosis not present

## 2014-11-09 DIAGNOSIS — N186 End stage renal disease: Secondary | ICD-10-CM | POA: Diagnosis not present

## 2014-11-09 DIAGNOSIS — D649 Anemia, unspecified: Secondary | ICD-10-CM | POA: Diagnosis not present

## 2014-11-11 DIAGNOSIS — D649 Anemia, unspecified: Secondary | ICD-10-CM | POA: Diagnosis not present

## 2014-11-11 DIAGNOSIS — N186 End stage renal disease: Secondary | ICD-10-CM | POA: Diagnosis not present

## 2014-11-11 DIAGNOSIS — D509 Iron deficiency anemia, unspecified: Secondary | ICD-10-CM | POA: Diagnosis not present

## 2014-11-11 DIAGNOSIS — N2581 Secondary hyperparathyroidism of renal origin: Secondary | ICD-10-CM | POA: Diagnosis not present

## 2014-11-13 DIAGNOSIS — D649 Anemia, unspecified: Secondary | ICD-10-CM | POA: Diagnosis not present

## 2014-11-13 DIAGNOSIS — N186 End stage renal disease: Secondary | ICD-10-CM | POA: Diagnosis not present

## 2014-11-13 DIAGNOSIS — D509 Iron deficiency anemia, unspecified: Secondary | ICD-10-CM | POA: Diagnosis not present

## 2014-11-13 DIAGNOSIS — N2581 Secondary hyperparathyroidism of renal origin: Secondary | ICD-10-CM | POA: Diagnosis not present

## 2014-11-16 DIAGNOSIS — N186 End stage renal disease: Secondary | ICD-10-CM | POA: Diagnosis not present

## 2014-11-16 DIAGNOSIS — D509 Iron deficiency anemia, unspecified: Secondary | ICD-10-CM | POA: Diagnosis not present

## 2014-11-16 DIAGNOSIS — N2581 Secondary hyperparathyroidism of renal origin: Secondary | ICD-10-CM | POA: Diagnosis not present

## 2014-11-16 DIAGNOSIS — D649 Anemia, unspecified: Secondary | ICD-10-CM | POA: Diagnosis not present

## 2014-11-17 DIAGNOSIS — C61 Malignant neoplasm of prostate: Secondary | ICD-10-CM | POA: Diagnosis not present

## 2014-11-17 DIAGNOSIS — E291 Testicular hypofunction: Secondary | ICD-10-CM | POA: Diagnosis not present

## 2014-11-18 DIAGNOSIS — D649 Anemia, unspecified: Secondary | ICD-10-CM | POA: Diagnosis not present

## 2014-11-18 DIAGNOSIS — N2581 Secondary hyperparathyroidism of renal origin: Secondary | ICD-10-CM | POA: Diagnosis not present

## 2014-11-18 DIAGNOSIS — D509 Iron deficiency anemia, unspecified: Secondary | ICD-10-CM | POA: Diagnosis not present

## 2014-11-18 DIAGNOSIS — N186 End stage renal disease: Secondary | ICD-10-CM | POA: Diagnosis not present

## 2014-11-20 DIAGNOSIS — N2581 Secondary hyperparathyroidism of renal origin: Secondary | ICD-10-CM | POA: Diagnosis not present

## 2014-11-20 DIAGNOSIS — D649 Anemia, unspecified: Secondary | ICD-10-CM | POA: Diagnosis not present

## 2014-11-20 DIAGNOSIS — N186 End stage renal disease: Secondary | ICD-10-CM | POA: Diagnosis not present

## 2014-11-20 DIAGNOSIS — D509 Iron deficiency anemia, unspecified: Secondary | ICD-10-CM | POA: Diagnosis not present

## 2014-11-23 DIAGNOSIS — N186 End stage renal disease: Secondary | ICD-10-CM | POA: Diagnosis not present

## 2014-11-23 DIAGNOSIS — D649 Anemia, unspecified: Secondary | ICD-10-CM | POA: Diagnosis not present

## 2014-11-23 DIAGNOSIS — N2581 Secondary hyperparathyroidism of renal origin: Secondary | ICD-10-CM | POA: Diagnosis not present

## 2014-11-23 DIAGNOSIS — D509 Iron deficiency anemia, unspecified: Secondary | ICD-10-CM | POA: Diagnosis not present

## 2014-11-25 DIAGNOSIS — C61 Malignant neoplasm of prostate: Secondary | ICD-10-CM | POA: Diagnosis not present

## 2014-11-25 DIAGNOSIS — N2581 Secondary hyperparathyroidism of renal origin: Secondary | ICD-10-CM | POA: Diagnosis not present

## 2014-11-25 DIAGNOSIS — D509 Iron deficiency anemia, unspecified: Secondary | ICD-10-CM | POA: Diagnosis not present

## 2014-11-25 DIAGNOSIS — N3941 Urge incontinence: Secondary | ICD-10-CM | POA: Diagnosis not present

## 2014-11-25 DIAGNOSIS — N186 End stage renal disease: Secondary | ICD-10-CM | POA: Diagnosis not present

## 2014-11-25 DIAGNOSIS — D649 Anemia, unspecified: Secondary | ICD-10-CM | POA: Diagnosis not present

## 2014-11-27 DIAGNOSIS — D509 Iron deficiency anemia, unspecified: Secondary | ICD-10-CM | POA: Diagnosis not present

## 2014-11-27 DIAGNOSIS — N186 End stage renal disease: Secondary | ICD-10-CM | POA: Diagnosis not present

## 2014-11-27 DIAGNOSIS — N2581 Secondary hyperparathyroidism of renal origin: Secondary | ICD-10-CM | POA: Diagnosis not present

## 2014-11-27 DIAGNOSIS — D649 Anemia, unspecified: Secondary | ICD-10-CM | POA: Diagnosis not present

## 2014-11-30 DIAGNOSIS — D509 Iron deficiency anemia, unspecified: Secondary | ICD-10-CM | POA: Diagnosis not present

## 2014-11-30 DIAGNOSIS — D649 Anemia, unspecified: Secondary | ICD-10-CM | POA: Diagnosis not present

## 2014-11-30 DIAGNOSIS — N2581 Secondary hyperparathyroidism of renal origin: Secondary | ICD-10-CM | POA: Diagnosis not present

## 2014-11-30 DIAGNOSIS — N186 End stage renal disease: Secondary | ICD-10-CM | POA: Diagnosis not present

## 2014-12-02 DIAGNOSIS — N2581 Secondary hyperparathyroidism of renal origin: Secondary | ICD-10-CM | POA: Diagnosis not present

## 2014-12-02 DIAGNOSIS — C61 Malignant neoplasm of prostate: Secondary | ICD-10-CM | POA: Diagnosis not present

## 2014-12-02 DIAGNOSIS — N186 End stage renal disease: Secondary | ICD-10-CM | POA: Diagnosis not present

## 2014-12-02 DIAGNOSIS — D649 Anemia, unspecified: Secondary | ICD-10-CM | POA: Diagnosis not present

## 2014-12-02 DIAGNOSIS — D509 Iron deficiency anemia, unspecified: Secondary | ICD-10-CM | POA: Diagnosis not present

## 2014-12-04 DIAGNOSIS — D649 Anemia, unspecified: Secondary | ICD-10-CM | POA: Diagnosis not present

## 2014-12-04 DIAGNOSIS — N186 End stage renal disease: Secondary | ICD-10-CM | POA: Diagnosis not present

## 2014-12-04 DIAGNOSIS — N2581 Secondary hyperparathyroidism of renal origin: Secondary | ICD-10-CM | POA: Diagnosis not present

## 2014-12-04 DIAGNOSIS — D509 Iron deficiency anemia, unspecified: Secondary | ICD-10-CM | POA: Diagnosis not present

## 2014-12-06 DIAGNOSIS — Z992 Dependence on renal dialysis: Secondary | ICD-10-CM | POA: Diagnosis not present

## 2014-12-06 DIAGNOSIS — N186 End stage renal disease: Secondary | ICD-10-CM | POA: Diagnosis not present

## 2014-12-07 DIAGNOSIS — N186 End stage renal disease: Secondary | ICD-10-CM | POA: Diagnosis not present

## 2014-12-07 DIAGNOSIS — N2581 Secondary hyperparathyroidism of renal origin: Secondary | ICD-10-CM | POA: Diagnosis not present

## 2014-12-07 DIAGNOSIS — D649 Anemia, unspecified: Secondary | ICD-10-CM | POA: Diagnosis not present

## 2014-12-09 DIAGNOSIS — N2581 Secondary hyperparathyroidism of renal origin: Secondary | ICD-10-CM | POA: Diagnosis not present

## 2014-12-09 DIAGNOSIS — N186 End stage renal disease: Secondary | ICD-10-CM | POA: Diagnosis not present

## 2014-12-09 DIAGNOSIS — D649 Anemia, unspecified: Secondary | ICD-10-CM | POA: Diagnosis not present

## 2014-12-11 DIAGNOSIS — N2581 Secondary hyperparathyroidism of renal origin: Secondary | ICD-10-CM | POA: Diagnosis not present

## 2014-12-11 DIAGNOSIS — N186 End stage renal disease: Secondary | ICD-10-CM | POA: Diagnosis not present

## 2014-12-11 DIAGNOSIS — D649 Anemia, unspecified: Secondary | ICD-10-CM | POA: Diagnosis not present

## 2014-12-14 DIAGNOSIS — N2581 Secondary hyperparathyroidism of renal origin: Secondary | ICD-10-CM | POA: Diagnosis not present

## 2014-12-14 DIAGNOSIS — D649 Anemia, unspecified: Secondary | ICD-10-CM | POA: Diagnosis not present

## 2014-12-14 DIAGNOSIS — N186 End stage renal disease: Secondary | ICD-10-CM | POA: Diagnosis not present

## 2014-12-16 DIAGNOSIS — N2581 Secondary hyperparathyroidism of renal origin: Secondary | ICD-10-CM | POA: Diagnosis not present

## 2014-12-16 DIAGNOSIS — D649 Anemia, unspecified: Secondary | ICD-10-CM | POA: Diagnosis not present

## 2014-12-16 DIAGNOSIS — N186 End stage renal disease: Secondary | ICD-10-CM | POA: Diagnosis not present

## 2014-12-18 DIAGNOSIS — N2581 Secondary hyperparathyroidism of renal origin: Secondary | ICD-10-CM | POA: Diagnosis not present

## 2014-12-18 DIAGNOSIS — D649 Anemia, unspecified: Secondary | ICD-10-CM | POA: Diagnosis not present

## 2014-12-18 DIAGNOSIS — N186 End stage renal disease: Secondary | ICD-10-CM | POA: Diagnosis not present

## 2014-12-21 DIAGNOSIS — N2581 Secondary hyperparathyroidism of renal origin: Secondary | ICD-10-CM | POA: Diagnosis not present

## 2014-12-21 DIAGNOSIS — N186 End stage renal disease: Secondary | ICD-10-CM | POA: Diagnosis not present

## 2014-12-21 DIAGNOSIS — D649 Anemia, unspecified: Secondary | ICD-10-CM | POA: Diagnosis not present

## 2014-12-23 DIAGNOSIS — N2581 Secondary hyperparathyroidism of renal origin: Secondary | ICD-10-CM | POA: Diagnosis not present

## 2014-12-23 DIAGNOSIS — D649 Anemia, unspecified: Secondary | ICD-10-CM | POA: Diagnosis not present

## 2014-12-23 DIAGNOSIS — N186 End stage renal disease: Secondary | ICD-10-CM | POA: Diagnosis not present

## 2014-12-25 DIAGNOSIS — N186 End stage renal disease: Secondary | ICD-10-CM | POA: Diagnosis not present

## 2014-12-25 DIAGNOSIS — D649 Anemia, unspecified: Secondary | ICD-10-CM | POA: Diagnosis not present

## 2014-12-25 DIAGNOSIS — N2581 Secondary hyperparathyroidism of renal origin: Secondary | ICD-10-CM | POA: Diagnosis not present

## 2014-12-28 DIAGNOSIS — D649 Anemia, unspecified: Secondary | ICD-10-CM | POA: Diagnosis not present

## 2014-12-28 DIAGNOSIS — N186 End stage renal disease: Secondary | ICD-10-CM | POA: Diagnosis not present

## 2014-12-28 DIAGNOSIS — N2581 Secondary hyperparathyroidism of renal origin: Secondary | ICD-10-CM | POA: Diagnosis not present

## 2014-12-30 DIAGNOSIS — N186 End stage renal disease: Secondary | ICD-10-CM | POA: Diagnosis not present

## 2014-12-30 DIAGNOSIS — N2581 Secondary hyperparathyroidism of renal origin: Secondary | ICD-10-CM | POA: Diagnosis not present

## 2014-12-30 DIAGNOSIS — D649 Anemia, unspecified: Secondary | ICD-10-CM | POA: Diagnosis not present

## 2015-01-01 DIAGNOSIS — D649 Anemia, unspecified: Secondary | ICD-10-CM | POA: Diagnosis not present

## 2015-01-01 DIAGNOSIS — N186 End stage renal disease: Secondary | ICD-10-CM | POA: Diagnosis not present

## 2015-01-01 DIAGNOSIS — N2581 Secondary hyperparathyroidism of renal origin: Secondary | ICD-10-CM | POA: Diagnosis not present

## 2015-01-04 DIAGNOSIS — D649 Anemia, unspecified: Secondary | ICD-10-CM | POA: Diagnosis not present

## 2015-01-04 DIAGNOSIS — N2581 Secondary hyperparathyroidism of renal origin: Secondary | ICD-10-CM | POA: Diagnosis not present

## 2015-01-04 DIAGNOSIS — Z992 Dependence on renal dialysis: Secondary | ICD-10-CM | POA: Diagnosis not present

## 2015-01-04 DIAGNOSIS — N186 End stage renal disease: Secondary | ICD-10-CM | POA: Diagnosis not present

## 2015-01-06 DIAGNOSIS — N186 End stage renal disease: Secondary | ICD-10-CM | POA: Diagnosis not present

## 2015-01-06 DIAGNOSIS — D649 Anemia, unspecified: Secondary | ICD-10-CM | POA: Diagnosis not present

## 2015-01-06 DIAGNOSIS — N2581 Secondary hyperparathyroidism of renal origin: Secondary | ICD-10-CM | POA: Diagnosis not present

## 2015-01-08 DIAGNOSIS — D649 Anemia, unspecified: Secondary | ICD-10-CM | POA: Diagnosis not present

## 2015-01-08 DIAGNOSIS — N186 End stage renal disease: Secondary | ICD-10-CM | POA: Diagnosis not present

## 2015-01-08 DIAGNOSIS — N2581 Secondary hyperparathyroidism of renal origin: Secondary | ICD-10-CM | POA: Diagnosis not present

## 2015-01-11 DIAGNOSIS — N2581 Secondary hyperparathyroidism of renal origin: Secondary | ICD-10-CM | POA: Diagnosis not present

## 2015-01-11 DIAGNOSIS — D649 Anemia, unspecified: Secondary | ICD-10-CM | POA: Diagnosis not present

## 2015-01-11 DIAGNOSIS — N186 End stage renal disease: Secondary | ICD-10-CM | POA: Diagnosis not present

## 2015-01-13 DIAGNOSIS — N186 End stage renal disease: Secondary | ICD-10-CM | POA: Diagnosis not present

## 2015-01-13 DIAGNOSIS — N2581 Secondary hyperparathyroidism of renal origin: Secondary | ICD-10-CM | POA: Diagnosis not present

## 2015-01-13 DIAGNOSIS — D649 Anemia, unspecified: Secondary | ICD-10-CM | POA: Diagnosis not present

## 2015-01-15 DIAGNOSIS — N186 End stage renal disease: Secondary | ICD-10-CM | POA: Diagnosis not present

## 2015-01-15 DIAGNOSIS — D649 Anemia, unspecified: Secondary | ICD-10-CM | POA: Diagnosis not present

## 2015-01-15 DIAGNOSIS — N2581 Secondary hyperparathyroidism of renal origin: Secondary | ICD-10-CM | POA: Diagnosis not present

## 2015-01-18 DIAGNOSIS — N2581 Secondary hyperparathyroidism of renal origin: Secondary | ICD-10-CM | POA: Diagnosis not present

## 2015-01-18 DIAGNOSIS — D649 Anemia, unspecified: Secondary | ICD-10-CM | POA: Diagnosis not present

## 2015-01-18 DIAGNOSIS — N186 End stage renal disease: Secondary | ICD-10-CM | POA: Diagnosis not present

## 2015-01-20 DIAGNOSIS — D649 Anemia, unspecified: Secondary | ICD-10-CM | POA: Diagnosis not present

## 2015-01-20 DIAGNOSIS — N2581 Secondary hyperparathyroidism of renal origin: Secondary | ICD-10-CM | POA: Diagnosis not present

## 2015-01-20 DIAGNOSIS — N186 End stage renal disease: Secondary | ICD-10-CM | POA: Diagnosis not present

## 2015-01-22 ENCOUNTER — Telehealth: Payer: Self-pay

## 2015-01-22 DIAGNOSIS — N2581 Secondary hyperparathyroidism of renal origin: Secondary | ICD-10-CM | POA: Diagnosis not present

## 2015-01-22 DIAGNOSIS — D649 Anemia, unspecified: Secondary | ICD-10-CM | POA: Diagnosis not present

## 2015-01-22 DIAGNOSIS — N186 End stage renal disease: Secondary | ICD-10-CM | POA: Diagnosis not present

## 2015-01-22 NOTE — Telephone Encounter (Signed)
Advised pt to call back if he still wants flu vaccine

## 2015-01-25 DIAGNOSIS — N2581 Secondary hyperparathyroidism of renal origin: Secondary | ICD-10-CM | POA: Diagnosis not present

## 2015-01-25 DIAGNOSIS — D649 Anemia, unspecified: Secondary | ICD-10-CM | POA: Diagnosis not present

## 2015-01-25 DIAGNOSIS — N186 End stage renal disease: Secondary | ICD-10-CM | POA: Diagnosis not present

## 2015-01-27 DIAGNOSIS — N186 End stage renal disease: Secondary | ICD-10-CM | POA: Diagnosis not present

## 2015-01-27 DIAGNOSIS — N2581 Secondary hyperparathyroidism of renal origin: Secondary | ICD-10-CM | POA: Diagnosis not present

## 2015-01-27 DIAGNOSIS — D649 Anemia, unspecified: Secondary | ICD-10-CM | POA: Diagnosis not present

## 2015-01-29 DIAGNOSIS — N186 End stage renal disease: Secondary | ICD-10-CM | POA: Diagnosis not present

## 2015-01-29 DIAGNOSIS — N2581 Secondary hyperparathyroidism of renal origin: Secondary | ICD-10-CM | POA: Diagnosis not present

## 2015-01-29 DIAGNOSIS — D649 Anemia, unspecified: Secondary | ICD-10-CM | POA: Diagnosis not present

## 2015-02-01 DIAGNOSIS — D649 Anemia, unspecified: Secondary | ICD-10-CM | POA: Diagnosis not present

## 2015-02-01 DIAGNOSIS — N186 End stage renal disease: Secondary | ICD-10-CM | POA: Diagnosis not present

## 2015-02-01 DIAGNOSIS — N2581 Secondary hyperparathyroidism of renal origin: Secondary | ICD-10-CM | POA: Diagnosis not present

## 2015-02-03 DIAGNOSIS — N2581 Secondary hyperparathyroidism of renal origin: Secondary | ICD-10-CM | POA: Diagnosis not present

## 2015-02-03 DIAGNOSIS — D649 Anemia, unspecified: Secondary | ICD-10-CM | POA: Diagnosis not present

## 2015-02-03 DIAGNOSIS — N186 End stage renal disease: Secondary | ICD-10-CM | POA: Diagnosis not present

## 2015-02-04 DIAGNOSIS — Z992 Dependence on renal dialysis: Secondary | ICD-10-CM | POA: Diagnosis not present

## 2015-02-04 DIAGNOSIS — N186 End stage renal disease: Secondary | ICD-10-CM | POA: Diagnosis not present

## 2015-02-04 DIAGNOSIS — I158 Other secondary hypertension: Secondary | ICD-10-CM | POA: Diagnosis not present

## 2015-02-05 DIAGNOSIS — D649 Anemia, unspecified: Secondary | ICD-10-CM | POA: Diagnosis not present

## 2015-02-05 DIAGNOSIS — N2581 Secondary hyperparathyroidism of renal origin: Secondary | ICD-10-CM | POA: Diagnosis not present

## 2015-02-05 DIAGNOSIS — N186 End stage renal disease: Secondary | ICD-10-CM | POA: Diagnosis not present

## 2015-02-08 DIAGNOSIS — D649 Anemia, unspecified: Secondary | ICD-10-CM | POA: Diagnosis not present

## 2015-02-08 DIAGNOSIS — N186 End stage renal disease: Secondary | ICD-10-CM | POA: Diagnosis not present

## 2015-02-08 DIAGNOSIS — N2581 Secondary hyperparathyroidism of renal origin: Secondary | ICD-10-CM | POA: Diagnosis not present

## 2015-02-10 DIAGNOSIS — N186 End stage renal disease: Secondary | ICD-10-CM | POA: Diagnosis not present

## 2015-02-10 DIAGNOSIS — N2581 Secondary hyperparathyroidism of renal origin: Secondary | ICD-10-CM | POA: Diagnosis not present

## 2015-02-10 DIAGNOSIS — D649 Anemia, unspecified: Secondary | ICD-10-CM | POA: Diagnosis not present

## 2015-02-12 DIAGNOSIS — N186 End stage renal disease: Secondary | ICD-10-CM | POA: Diagnosis not present

## 2015-02-12 DIAGNOSIS — D649 Anemia, unspecified: Secondary | ICD-10-CM | POA: Diagnosis not present

## 2015-02-12 DIAGNOSIS — N2581 Secondary hyperparathyroidism of renal origin: Secondary | ICD-10-CM | POA: Diagnosis not present

## 2015-02-15 DIAGNOSIS — D649 Anemia, unspecified: Secondary | ICD-10-CM | POA: Diagnosis not present

## 2015-02-15 DIAGNOSIS — N186 End stage renal disease: Secondary | ICD-10-CM | POA: Diagnosis not present

## 2015-02-15 DIAGNOSIS — N2581 Secondary hyperparathyroidism of renal origin: Secondary | ICD-10-CM | POA: Diagnosis not present

## 2015-02-17 DIAGNOSIS — N2581 Secondary hyperparathyroidism of renal origin: Secondary | ICD-10-CM | POA: Diagnosis not present

## 2015-02-17 DIAGNOSIS — N186 End stage renal disease: Secondary | ICD-10-CM | POA: Diagnosis not present

## 2015-02-17 DIAGNOSIS — D649 Anemia, unspecified: Secondary | ICD-10-CM | POA: Diagnosis not present

## 2015-02-19 DIAGNOSIS — N2581 Secondary hyperparathyroidism of renal origin: Secondary | ICD-10-CM | POA: Diagnosis not present

## 2015-02-19 DIAGNOSIS — D649 Anemia, unspecified: Secondary | ICD-10-CM | POA: Diagnosis not present

## 2015-02-19 DIAGNOSIS — N186 End stage renal disease: Secondary | ICD-10-CM | POA: Diagnosis not present

## 2015-02-22 DIAGNOSIS — N2581 Secondary hyperparathyroidism of renal origin: Secondary | ICD-10-CM | POA: Diagnosis not present

## 2015-02-22 DIAGNOSIS — N186 End stage renal disease: Secondary | ICD-10-CM | POA: Diagnosis not present

## 2015-02-22 DIAGNOSIS — D649 Anemia, unspecified: Secondary | ICD-10-CM | POA: Diagnosis not present

## 2015-02-23 DIAGNOSIS — M25552 Pain in left hip: Secondary | ICD-10-CM | POA: Diagnosis not present

## 2015-02-23 DIAGNOSIS — M4806 Spinal stenosis, lumbar region: Secondary | ICD-10-CM | POA: Diagnosis not present

## 2015-02-24 DIAGNOSIS — N186 End stage renal disease: Secondary | ICD-10-CM | POA: Diagnosis not present

## 2015-02-24 DIAGNOSIS — D649 Anemia, unspecified: Secondary | ICD-10-CM | POA: Diagnosis not present

## 2015-02-24 DIAGNOSIS — N2581 Secondary hyperparathyroidism of renal origin: Secondary | ICD-10-CM | POA: Diagnosis not present

## 2015-02-26 DIAGNOSIS — D649 Anemia, unspecified: Secondary | ICD-10-CM | POA: Diagnosis not present

## 2015-02-26 DIAGNOSIS — N2581 Secondary hyperparathyroidism of renal origin: Secondary | ICD-10-CM | POA: Diagnosis not present

## 2015-02-26 DIAGNOSIS — N186 End stage renal disease: Secondary | ICD-10-CM | POA: Diagnosis not present

## 2015-03-01 DIAGNOSIS — N2581 Secondary hyperparathyroidism of renal origin: Secondary | ICD-10-CM | POA: Diagnosis not present

## 2015-03-01 DIAGNOSIS — D649 Anemia, unspecified: Secondary | ICD-10-CM | POA: Diagnosis not present

## 2015-03-01 DIAGNOSIS — N186 End stage renal disease: Secondary | ICD-10-CM | POA: Diagnosis not present

## 2015-03-03 DIAGNOSIS — D649 Anemia, unspecified: Secondary | ICD-10-CM | POA: Diagnosis not present

## 2015-03-03 DIAGNOSIS — N186 End stage renal disease: Secondary | ICD-10-CM | POA: Diagnosis not present

## 2015-03-03 DIAGNOSIS — N2581 Secondary hyperparathyroidism of renal origin: Secondary | ICD-10-CM | POA: Diagnosis not present

## 2015-03-03 DIAGNOSIS — C61 Malignant neoplasm of prostate: Secondary | ICD-10-CM | POA: Diagnosis not present

## 2015-03-05 DIAGNOSIS — N186 End stage renal disease: Secondary | ICD-10-CM | POA: Diagnosis not present

## 2015-03-05 DIAGNOSIS — D649 Anemia, unspecified: Secondary | ICD-10-CM | POA: Diagnosis not present

## 2015-03-05 DIAGNOSIS — N2581 Secondary hyperparathyroidism of renal origin: Secondary | ICD-10-CM | POA: Diagnosis not present

## 2015-03-06 DIAGNOSIS — I158 Other secondary hypertension: Secondary | ICD-10-CM | POA: Diagnosis not present

## 2015-03-06 DIAGNOSIS — N186 End stage renal disease: Secondary | ICD-10-CM | POA: Diagnosis not present

## 2015-03-06 DIAGNOSIS — Z992 Dependence on renal dialysis: Secondary | ICD-10-CM | POA: Diagnosis not present

## 2015-03-08 DIAGNOSIS — D649 Anemia, unspecified: Secondary | ICD-10-CM | POA: Diagnosis not present

## 2015-03-08 DIAGNOSIS — N2581 Secondary hyperparathyroidism of renal origin: Secondary | ICD-10-CM | POA: Diagnosis not present

## 2015-03-08 DIAGNOSIS — N186 End stage renal disease: Secondary | ICD-10-CM | POA: Diagnosis not present

## 2015-03-08 DIAGNOSIS — D509 Iron deficiency anemia, unspecified: Secondary | ICD-10-CM | POA: Diagnosis not present

## 2015-03-10 DIAGNOSIS — D509 Iron deficiency anemia, unspecified: Secondary | ICD-10-CM | POA: Diagnosis not present

## 2015-03-10 DIAGNOSIS — N2581 Secondary hyperparathyroidism of renal origin: Secondary | ICD-10-CM | POA: Diagnosis not present

## 2015-03-10 DIAGNOSIS — N186 End stage renal disease: Secondary | ICD-10-CM | POA: Diagnosis not present

## 2015-03-10 DIAGNOSIS — D649 Anemia, unspecified: Secondary | ICD-10-CM | POA: Diagnosis not present

## 2015-03-12 DIAGNOSIS — D509 Iron deficiency anemia, unspecified: Secondary | ICD-10-CM | POA: Diagnosis not present

## 2015-03-12 DIAGNOSIS — N186 End stage renal disease: Secondary | ICD-10-CM | POA: Diagnosis not present

## 2015-03-12 DIAGNOSIS — D649 Anemia, unspecified: Secondary | ICD-10-CM | POA: Diagnosis not present

## 2015-03-12 DIAGNOSIS — N2581 Secondary hyperparathyroidism of renal origin: Secondary | ICD-10-CM | POA: Diagnosis not present

## 2015-03-15 DIAGNOSIS — N186 End stage renal disease: Secondary | ICD-10-CM | POA: Diagnosis not present

## 2015-03-15 DIAGNOSIS — D509 Iron deficiency anemia, unspecified: Secondary | ICD-10-CM | POA: Diagnosis not present

## 2015-03-15 DIAGNOSIS — D649 Anemia, unspecified: Secondary | ICD-10-CM | POA: Diagnosis not present

## 2015-03-15 DIAGNOSIS — N2581 Secondary hyperparathyroidism of renal origin: Secondary | ICD-10-CM | POA: Diagnosis not present

## 2015-03-17 DIAGNOSIS — N2581 Secondary hyperparathyroidism of renal origin: Secondary | ICD-10-CM | POA: Diagnosis not present

## 2015-03-17 DIAGNOSIS — D649 Anemia, unspecified: Secondary | ICD-10-CM | POA: Diagnosis not present

## 2015-03-17 DIAGNOSIS — D509 Iron deficiency anemia, unspecified: Secondary | ICD-10-CM | POA: Diagnosis not present

## 2015-03-17 DIAGNOSIS — N186 End stage renal disease: Secondary | ICD-10-CM | POA: Diagnosis not present

## 2015-03-19 DIAGNOSIS — N2581 Secondary hyperparathyroidism of renal origin: Secondary | ICD-10-CM | POA: Diagnosis not present

## 2015-03-19 DIAGNOSIS — D649 Anemia, unspecified: Secondary | ICD-10-CM | POA: Diagnosis not present

## 2015-03-19 DIAGNOSIS — N186 End stage renal disease: Secondary | ICD-10-CM | POA: Diagnosis not present

## 2015-03-19 DIAGNOSIS — D509 Iron deficiency anemia, unspecified: Secondary | ICD-10-CM | POA: Diagnosis not present

## 2015-03-22 DIAGNOSIS — N2581 Secondary hyperparathyroidism of renal origin: Secondary | ICD-10-CM | POA: Diagnosis not present

## 2015-03-22 DIAGNOSIS — D509 Iron deficiency anemia, unspecified: Secondary | ICD-10-CM | POA: Diagnosis not present

## 2015-03-22 DIAGNOSIS — D649 Anemia, unspecified: Secondary | ICD-10-CM | POA: Diagnosis not present

## 2015-03-22 DIAGNOSIS — N186 End stage renal disease: Secondary | ICD-10-CM | POA: Diagnosis not present

## 2015-03-24 DIAGNOSIS — D649 Anemia, unspecified: Secondary | ICD-10-CM | POA: Diagnosis not present

## 2015-03-24 DIAGNOSIS — D509 Iron deficiency anemia, unspecified: Secondary | ICD-10-CM | POA: Diagnosis not present

## 2015-03-24 DIAGNOSIS — N186 End stage renal disease: Secondary | ICD-10-CM | POA: Diagnosis not present

## 2015-03-24 DIAGNOSIS — N2581 Secondary hyperparathyroidism of renal origin: Secondary | ICD-10-CM | POA: Diagnosis not present

## 2015-03-26 DIAGNOSIS — D649 Anemia, unspecified: Secondary | ICD-10-CM | POA: Diagnosis not present

## 2015-03-26 DIAGNOSIS — D509 Iron deficiency anemia, unspecified: Secondary | ICD-10-CM | POA: Diagnosis not present

## 2015-03-26 DIAGNOSIS — N2581 Secondary hyperparathyroidism of renal origin: Secondary | ICD-10-CM | POA: Diagnosis not present

## 2015-03-26 DIAGNOSIS — N186 End stage renal disease: Secondary | ICD-10-CM | POA: Diagnosis not present

## 2015-03-29 DIAGNOSIS — D509 Iron deficiency anemia, unspecified: Secondary | ICD-10-CM | POA: Diagnosis not present

## 2015-03-29 DIAGNOSIS — N2581 Secondary hyperparathyroidism of renal origin: Secondary | ICD-10-CM | POA: Diagnosis not present

## 2015-03-29 DIAGNOSIS — N186 End stage renal disease: Secondary | ICD-10-CM | POA: Diagnosis not present

## 2015-03-29 DIAGNOSIS — D649 Anemia, unspecified: Secondary | ICD-10-CM | POA: Diagnosis not present

## 2015-03-31 DIAGNOSIS — N186 End stage renal disease: Secondary | ICD-10-CM | POA: Diagnosis not present

## 2015-03-31 DIAGNOSIS — N2581 Secondary hyperparathyroidism of renal origin: Secondary | ICD-10-CM | POA: Diagnosis not present

## 2015-03-31 DIAGNOSIS — D509 Iron deficiency anemia, unspecified: Secondary | ICD-10-CM | POA: Diagnosis not present

## 2015-03-31 DIAGNOSIS — D649 Anemia, unspecified: Secondary | ICD-10-CM | POA: Diagnosis not present

## 2015-04-01 ENCOUNTER — Ambulatory Visit: Payer: Medicare Other | Admitting: Internal Medicine

## 2015-04-02 DIAGNOSIS — N186 End stage renal disease: Secondary | ICD-10-CM | POA: Diagnosis not present

## 2015-04-02 DIAGNOSIS — N2581 Secondary hyperparathyroidism of renal origin: Secondary | ICD-10-CM | POA: Diagnosis not present

## 2015-04-02 DIAGNOSIS — D509 Iron deficiency anemia, unspecified: Secondary | ICD-10-CM | POA: Diagnosis not present

## 2015-04-02 DIAGNOSIS — D649 Anemia, unspecified: Secondary | ICD-10-CM | POA: Diagnosis not present

## 2015-04-05 DIAGNOSIS — N186 End stage renal disease: Secondary | ICD-10-CM | POA: Diagnosis not present

## 2015-04-05 DIAGNOSIS — N2581 Secondary hyperparathyroidism of renal origin: Secondary | ICD-10-CM | POA: Diagnosis not present

## 2015-04-05 DIAGNOSIS — D509 Iron deficiency anemia, unspecified: Secondary | ICD-10-CM | POA: Diagnosis not present

## 2015-04-05 DIAGNOSIS — D649 Anemia, unspecified: Secondary | ICD-10-CM | POA: Diagnosis not present

## 2015-04-06 DIAGNOSIS — Z992 Dependence on renal dialysis: Secondary | ICD-10-CM | POA: Diagnosis not present

## 2015-04-06 DIAGNOSIS — I158 Other secondary hypertension: Secondary | ICD-10-CM | POA: Diagnosis not present

## 2015-04-06 DIAGNOSIS — N186 End stage renal disease: Secondary | ICD-10-CM | POA: Diagnosis not present

## 2015-04-07 DIAGNOSIS — D631 Anemia in chronic kidney disease: Secondary | ICD-10-CM | POA: Diagnosis not present

## 2015-04-07 DIAGNOSIS — D509 Iron deficiency anemia, unspecified: Secondary | ICD-10-CM | POA: Diagnosis not present

## 2015-04-07 DIAGNOSIS — N186 End stage renal disease: Secondary | ICD-10-CM | POA: Diagnosis not present

## 2015-04-07 DIAGNOSIS — N2581 Secondary hyperparathyroidism of renal origin: Secondary | ICD-10-CM | POA: Diagnosis not present

## 2015-04-09 DIAGNOSIS — D509 Iron deficiency anemia, unspecified: Secondary | ICD-10-CM | POA: Diagnosis not present

## 2015-04-09 DIAGNOSIS — D631 Anemia in chronic kidney disease: Secondary | ICD-10-CM | POA: Diagnosis not present

## 2015-04-09 DIAGNOSIS — N186 End stage renal disease: Secondary | ICD-10-CM | POA: Diagnosis not present

## 2015-04-09 DIAGNOSIS — N2581 Secondary hyperparathyroidism of renal origin: Secondary | ICD-10-CM | POA: Diagnosis not present

## 2015-04-12 DIAGNOSIS — D509 Iron deficiency anemia, unspecified: Secondary | ICD-10-CM | POA: Diagnosis not present

## 2015-04-12 DIAGNOSIS — N2581 Secondary hyperparathyroidism of renal origin: Secondary | ICD-10-CM | POA: Diagnosis not present

## 2015-04-12 DIAGNOSIS — D631 Anemia in chronic kidney disease: Secondary | ICD-10-CM | POA: Diagnosis not present

## 2015-04-12 DIAGNOSIS — N186 End stage renal disease: Secondary | ICD-10-CM | POA: Diagnosis not present

## 2015-04-13 ENCOUNTER — Ambulatory Visit (INDEPENDENT_AMBULATORY_CARE_PROVIDER_SITE_OTHER): Payer: Medicare Other | Admitting: Internal Medicine

## 2015-04-13 ENCOUNTER — Encounter: Payer: Self-pay | Admitting: Internal Medicine

## 2015-04-13 VITALS — BP 138/52 | HR 96 | Temp 97.7°F | Wt 154.0 lb

## 2015-04-13 DIAGNOSIS — E785 Hyperlipidemia, unspecified: Secondary | ICD-10-CM | POA: Diagnosis not present

## 2015-04-13 DIAGNOSIS — I10 Essential (primary) hypertension: Secondary | ICD-10-CM | POA: Diagnosis not present

## 2015-04-13 DIAGNOSIS — D509 Iron deficiency anemia, unspecified: Secondary | ICD-10-CM

## 2015-04-13 MED ORDER — AMLODIPINE BESYLATE 5 MG PO TABS: 5.0000 mg | ORAL_TABLET | Freq: Every day | ORAL | Status: DC

## 2015-04-13 MED ORDER — ATENOLOL 50 MG PO TABS: ORAL_TABLET | ORAL | Status: DC

## 2015-04-13 NOTE — Patient Instructions (Signed)
Ok to decrease the norvasc (amlodipine) to 5 mg per day  Please continue all other medications as before, and refills have been done if requested - the atenolol  Please have the pharmacy call with any other refills you may need.  Please continue your efforts at being more active, low cholesterol diet, and weight control.  You are otherwise up to date with prevention measures today.  Please keep your appointments with your specialists as you may have planned  Please return in 1 year for your yearly visit, or sooner if needed

## 2015-04-13 NOTE — Progress Notes (Signed)
Subjective:    Patient ID: [name], male    DOB: 05/18/1929, 81 y.o.   MRN: [id number]  HPI Here for yearly f/u;  Overall doing ok;  Pt denies Chest pain, worsening SOB, DOE, wheezing, orthopnea, PND, worsening LE edema, palpitations, dizziness or syncope.  Pt denies neurological change such as new headache, facial or extremity weakness.  Pt denies polydipsia, polyuria, or low sugar symptoms. Pt states overall good compliance with treatment and medications, good tolerability, and has been trying to follow appropriate diet.  Pt denies worsening depressive symptoms, suicidal ideation or panic. No fever, night sweats, wt loss, loss of appetite, or other constitutional symptoms.  Pt states good ability with ADL's, has low fall risk, home safety reviewed and adequate, no other significant changes in hearing or vision, and only occasionally active with exercise.  BP has been on lower side in the 80's worse after dialysis, now with ESRD with HD M-W-F, assoc with dizziness.    Past Medical History  Diagnosis Date  . ANEMIA-IRON DEFICIENCY 03/09/2008  . DIVERTICULOSIS, COLON 03/09/2008  . FATIGUE 03/09/2008  . GOUT 03/09/2008  . HYPERLIPIDEMIA 03/09/2008  . HYPERTENSION 03/09/2008  . PROSTATE CANCER, HX OF 03/09/2008  . RENAL INSUFFICIENCY 03/09/2008  . SHOULDER PAIN, LEFT 06/03/2010  . Complex renal cyst 06/19/2011  . Hyperparathyroidism   . Cancer     prostate  . Arthritis   . History of blood transfusion   . Depression 03/26/2014   Past Surgical History  Procedure Laterality Date  . Tonsillectomy    . Rotator cuff repair right  4/08  . S/p left hip replacement  2007    Dr. Percell Miller ortho  . Joint replacement      HIP  . Av fistula placement Left 01/24/2013    Procedure: INSERTION OF ARTERIOVENOUS (AV) GORE-TEX GRAFT ARM;  Surgeon: Rosetta Posner, MD;  Location: [place];  Service: Vascular;  Laterality: Left;    reports that he quit smoking about 32 years ago. His smoking use included Cigarettes. He quit  after 5 years of use. He has never used smokeless tobacco. He reports that he does not drink alcohol or use illicit drugs. family history includes Cancer in his father; Diabetes in his father; Hypertension in his brother and father. No Known Allergies Current Outpatient Prescriptions on File Prior to Visit  Medication Sig Dispense Refill  . allopurinol (ZYLOPRIM) 100 MG tablet TAKE 1 TABLET DAILY 90 tablet 3  . aspirin 81 MG tablet Take 81 mg by mouth daily.     . citalopram (CELEXA) 10 MG tablet Take 1 tablet (10 mg total) by mouth daily. 90 tablet 3  . doxercalciferol (HECTOROL) 0.5 MCG capsule Take 1.5 mcg by mouth daily.     . rosuvastatin (CRESTOR) 20 MG tablet Take 1 tablet (20 mg total) by mouth at bedtime. 90 tablet 3   No current facility-administered medications on file prior to visit.   Declines immunizations for now. Review of Systems Constitutional: Negative for increased diaphoresis, other activity, appetite or siginficant weight change other than noted HENT: Negative for worsening hearing loss, ear pain, facial swelling, mouth sores and neck stiffness.   Eyes: Negative for other worsening pain, redness or visual disturbance.  Respiratory: Negative for shortness of breath and wheezing  Cardiovascular: Negative for chest pain and palpitations.  Gastrointestinal: Negative for diarrhea, blood in stool, abdominal distention or other pain Genitourinary: Negative for hematuria, flank pain or change in urine volume.  Musculoskeletal: Negative for myalgias  or other joint complaints.  Skin: Negative for color change and wound or drainage.  Neurological: Negative for syncope and numbness. other than noted Hematological: Negative for adenopathy. or other swelling Psychiatric/Behavioral: Negative for hallucinations, SI, self-injury, decreased concentration or other worsening agitation.      Objective:   Physical Exam BP 138/52 mmHg  Pulse 96  Temp(Src) 97.7 F (36.5 C) (Oral)  Wt  154 lb (69.854 kg)  SpO2 96% VS noted,  Constitutional: Pt is oriented to person, place, and time. Appears well-developed and well-nourished, in no significant distress Head: Normocephalic and atraumatic.  Right Ear: External ear normal.  Left Ear: External ear normal.  Nose: Nose normal.  Mouth/Throat: Oropharynx is clear and moist.  Eyes: Conjunctivae and EOM are normal. Pupils are equal, round, and reactive to light.  Neck: Normal range of motion. Neck supple. No JVD present. No tracheal deviation present or significant neck LA or mass Cardiovascular: Normal rate, regular rhythm, normal heart sounds and intact distal pulses.   Pulmonary/Chest: Effort normal and breath sounds without rales or wheezing  Abdominal: Soft. Bowel sounds are normal. NT. No HSM  Musculoskeletal: Normal range of motion. Exhibits no edema.  Lymphadenopathy:  Has no cervical adenopathy.  Neurological: Pt is alert and oriented to person, place, and time. Pt has normal reflexes. No cranial nerve deficit. Motor grossly intact Skin: Skin is warm and dry. No rash noted.  Psychiatric:  Has normal mood and affect. Behavior is normal.     Assessment & Plan:

## 2015-04-13 NOTE — Assessment & Plan Note (Signed)
overcontrolled by hx, to decrease the amlodipine to 5 mg per day, cont to monitor at home, to d/c completely if still lower BP's in the 80's

## 2015-04-13 NOTE — Assessment & Plan Note (Signed)
Currently handled per renal, to cont iron with dialysis,  to f/u any worsening symptoms or concerns Lab Results  Component Value Date   WBC 6.2 03/17/2013   HGB 10.9* 08/19/2014   HCT 29.0* 03/17/2013   MCV 90.4 03/17/2013   PLT 232.0 03/17/2013

## 2015-04-13 NOTE — Progress Notes (Signed)
Pre visit review using our clinic review tool, if applicable. No additional management support is needed unless otherwise documented below in the visit note. 

## 2015-04-13 NOTE — Assessment & Plan Note (Signed)
stable overall by history and exam, recent data reviewed with pt, and pt to continue medical treatment as before,  to f/u any worsening symptoms or concerns Lab Results  Component Value Date   LDLCALC 97 07/26/2012   Declines further lab today

## 2015-04-14 ENCOUNTER — Telehealth: Payer: Self-pay

## 2015-04-14 DIAGNOSIS — N2581 Secondary hyperparathyroidism of renal origin: Secondary | ICD-10-CM | POA: Diagnosis not present

## 2015-04-14 DIAGNOSIS — D509 Iron deficiency anemia, unspecified: Secondary | ICD-10-CM | POA: Diagnosis not present

## 2015-04-14 DIAGNOSIS — D631 Anemia in chronic kidney disease: Secondary | ICD-10-CM | POA: Diagnosis not present

## 2015-04-14 DIAGNOSIS — N186 End stage renal disease: Secondary | ICD-10-CM | POA: Diagnosis not present

## 2015-04-14 NOTE — Telephone Encounter (Signed)
Ok for otc topical lotrimin

## 2015-04-14 NOTE — Telephone Encounter (Signed)
Pt states he discussed both a rash on his face and lower back pain. He was unsure if MD recommended OTC Advil and Lotrimin...please advise. Pt is concerned about taking OTC medication without MD approval because he does dialysis.

## 2015-04-14 NOTE — Telephone Encounter (Signed)
Per MD - No Advil due to dialysis. Pt advised of both via VM

## 2015-04-15 DIAGNOSIS — I871 Compression of vein: Secondary | ICD-10-CM | POA: Diagnosis not present

## 2015-04-15 DIAGNOSIS — Z992 Dependence on renal dialysis: Secondary | ICD-10-CM | POA: Diagnosis not present

## 2015-04-15 DIAGNOSIS — T82858A Stenosis of vascular prosthetic devices, implants and grafts, initial encounter: Secondary | ICD-10-CM | POA: Diagnosis not present

## 2015-04-15 DIAGNOSIS — N186 End stage renal disease: Secondary | ICD-10-CM | POA: Diagnosis not present

## 2015-04-16 DIAGNOSIS — N186 End stage renal disease: Secondary | ICD-10-CM | POA: Diagnosis not present

## 2015-04-16 DIAGNOSIS — D631 Anemia in chronic kidney disease: Secondary | ICD-10-CM | POA: Diagnosis not present

## 2015-04-16 DIAGNOSIS — D509 Iron deficiency anemia, unspecified: Secondary | ICD-10-CM | POA: Diagnosis not present

## 2015-04-16 DIAGNOSIS — N2581 Secondary hyperparathyroidism of renal origin: Secondary | ICD-10-CM | POA: Diagnosis not present

## 2015-04-19 DIAGNOSIS — N2581 Secondary hyperparathyroidism of renal origin: Secondary | ICD-10-CM | POA: Diagnosis not present

## 2015-04-19 DIAGNOSIS — N186 End stage renal disease: Secondary | ICD-10-CM | POA: Diagnosis not present

## 2015-04-19 DIAGNOSIS — D631 Anemia in chronic kidney disease: Secondary | ICD-10-CM | POA: Diagnosis not present

## 2015-04-19 DIAGNOSIS — D509 Iron deficiency anemia, unspecified: Secondary | ICD-10-CM | POA: Diagnosis not present

## 2015-04-21 DIAGNOSIS — D509 Iron deficiency anemia, unspecified: Secondary | ICD-10-CM | POA: Diagnosis not present

## 2015-04-21 DIAGNOSIS — D631 Anemia in chronic kidney disease: Secondary | ICD-10-CM | POA: Diagnosis not present

## 2015-04-21 DIAGNOSIS — N2581 Secondary hyperparathyroidism of renal origin: Secondary | ICD-10-CM | POA: Diagnosis not present

## 2015-04-21 DIAGNOSIS — N186 End stage renal disease: Secondary | ICD-10-CM | POA: Diagnosis not present

## 2015-04-23 DIAGNOSIS — D509 Iron deficiency anemia, unspecified: Secondary | ICD-10-CM | POA: Diagnosis not present

## 2015-04-23 DIAGNOSIS — N186 End stage renal disease: Secondary | ICD-10-CM | POA: Diagnosis not present

## 2015-04-23 DIAGNOSIS — N2581 Secondary hyperparathyroidism of renal origin: Secondary | ICD-10-CM | POA: Diagnosis not present

## 2015-04-23 DIAGNOSIS — D631 Anemia in chronic kidney disease: Secondary | ICD-10-CM | POA: Diagnosis not present

## 2015-04-26 DIAGNOSIS — D509 Iron deficiency anemia, unspecified: Secondary | ICD-10-CM | POA: Diagnosis not present

## 2015-04-26 DIAGNOSIS — D631 Anemia in chronic kidney disease: Secondary | ICD-10-CM | POA: Diagnosis not present

## 2015-04-26 DIAGNOSIS — N186 End stage renal disease: Secondary | ICD-10-CM | POA: Diagnosis not present

## 2015-04-26 DIAGNOSIS — N2581 Secondary hyperparathyroidism of renal origin: Secondary | ICD-10-CM | POA: Diagnosis not present

## 2015-04-26 DIAGNOSIS — L80 Vitiligo: Secondary | ICD-10-CM | POA: Diagnosis not present

## 2015-04-28 DIAGNOSIS — D509 Iron deficiency anemia, unspecified: Secondary | ICD-10-CM | POA: Diagnosis not present

## 2015-04-28 DIAGNOSIS — N2581 Secondary hyperparathyroidism of renal origin: Secondary | ICD-10-CM | POA: Diagnosis not present

## 2015-04-28 DIAGNOSIS — N186 End stage renal disease: Secondary | ICD-10-CM | POA: Diagnosis not present

## 2015-04-28 DIAGNOSIS — D631 Anemia in chronic kidney disease: Secondary | ICD-10-CM | POA: Diagnosis not present

## 2015-04-30 ENCOUNTER — Telehealth: Payer: Self-pay | Admitting: Internal Medicine

## 2015-04-30 DIAGNOSIS — D631 Anemia in chronic kidney disease: Secondary | ICD-10-CM | POA: Diagnosis not present

## 2015-04-30 DIAGNOSIS — D509 Iron deficiency anemia, unspecified: Secondary | ICD-10-CM | POA: Diagnosis not present

## 2015-04-30 DIAGNOSIS — N2581 Secondary hyperparathyroidism of renal origin: Secondary | ICD-10-CM | POA: Diagnosis not present

## 2015-04-30 DIAGNOSIS — N186 End stage renal disease: Secondary | ICD-10-CM | POA: Diagnosis not present

## 2015-04-30 NOTE — Telephone Encounter (Signed)
Left patient vm to call back to schedule cpe/awv

## 2015-05-03 DIAGNOSIS — D631 Anemia in chronic kidney disease: Secondary | ICD-10-CM | POA: Diagnosis not present

## 2015-05-03 DIAGNOSIS — N2581 Secondary hyperparathyroidism of renal origin: Secondary | ICD-10-CM | POA: Diagnosis not present

## 2015-05-03 DIAGNOSIS — D509 Iron deficiency anemia, unspecified: Secondary | ICD-10-CM | POA: Diagnosis not present

## 2015-05-03 DIAGNOSIS — N186 End stage renal disease: Secondary | ICD-10-CM | POA: Diagnosis not present

## 2015-05-05 DIAGNOSIS — D631 Anemia in chronic kidney disease: Secondary | ICD-10-CM | POA: Diagnosis not present

## 2015-05-05 DIAGNOSIS — N186 End stage renal disease: Secondary | ICD-10-CM | POA: Diagnosis not present

## 2015-05-05 DIAGNOSIS — D509 Iron deficiency anemia, unspecified: Secondary | ICD-10-CM | POA: Diagnosis not present

## 2015-05-05 DIAGNOSIS — N2581 Secondary hyperparathyroidism of renal origin: Secondary | ICD-10-CM | POA: Diagnosis not present

## 2015-05-06 ENCOUNTER — Encounter: Payer: Self-pay | Admitting: Internal Medicine

## 2015-05-06 ENCOUNTER — Ambulatory Visit (INDEPENDENT_AMBULATORY_CARE_PROVIDER_SITE_OTHER)
Admission: RE | Admit: 2015-05-06 | Discharge: 2015-05-06 | Disposition: A | Discharge status: Home / Self Care | Payer: Medicare Other | Source: Ambulatory Visit | Attending: Internal Medicine | Admitting: Internal Medicine

## 2015-05-06 ENCOUNTER — Ambulatory Visit (INDEPENDENT_AMBULATORY_CARE_PROVIDER_SITE_OTHER): Payer: Medicare Other | Admitting: Internal Medicine

## 2015-05-06 ENCOUNTER — Telehealth: Payer: Self-pay

## 2015-05-06 ENCOUNTER — Other Ambulatory Visit (INDEPENDENT_AMBULATORY_CARE_PROVIDER_SITE_OTHER): Payer: Medicare Other

## 2015-05-06 VITALS — BP 122/66 | HR 87 | Temp 98.4°F | Ht 68.0 in | Wt 153.0 lb

## 2015-05-06 DIAGNOSIS — Z992 Dependence on renal dialysis: Secondary | ICD-10-CM | POA: Diagnosis not present

## 2015-05-06 DIAGNOSIS — E785 Hyperlipidemia, unspecified: Secondary | ICD-10-CM

## 2015-05-06 DIAGNOSIS — M4186 Other forms of scoliosis, lumbar region: Secondary | ICD-10-CM | POA: Diagnosis not present

## 2015-05-06 DIAGNOSIS — I10 Essential (primary) hypertension: Secondary | ICD-10-CM | POA: Diagnosis not present

## 2015-05-06 DIAGNOSIS — M545 Low back pain, unspecified: Secondary | ICD-10-CM

## 2015-05-06 DIAGNOSIS — R0989 Other specified symptoms and signs involving the circulatory and respiratory systems: Secondary | ICD-10-CM | POA: Diagnosis not present

## 2015-05-06 DIAGNOSIS — Z0000 Encounter for general adult medical examination without abnormal findings: Secondary | ICD-10-CM

## 2015-05-06 DIAGNOSIS — I158 Other secondary hypertension: Secondary | ICD-10-CM | POA: Diagnosis not present

## 2015-05-06 DIAGNOSIS — F329 Major depressive disorder, single episode, unspecified: Secondary | ICD-10-CM | POA: Diagnosis not present

## 2015-05-06 DIAGNOSIS — M5137 Other intervertebral disc degeneration, lumbosacral region: Secondary | ICD-10-CM | POA: Diagnosis not present

## 2015-05-06 DIAGNOSIS — F32A Depression, unspecified: Secondary | ICD-10-CM

## 2015-05-06 DIAGNOSIS — N186 End stage renal disease: Secondary | ICD-10-CM | POA: Diagnosis not present

## 2015-05-06 LAB — CBC WITH DIFFERENTIAL/PLATELET
BASOS ABS: 0 10*3/uL (ref 0.0–0.1)
Basophils Relative: 0.6 % (ref 0.0–3.0)
EOS ABS: 0.2 10*3/uL (ref 0.0–0.7)
Eosinophils Relative: 3.4 % (ref 0.0–5.0)
HCT: 34.2 % — ABNORMAL LOW (ref 39.0–52.0)
Hemoglobin: 11.5 g/dL — ABNORMAL LOW (ref 13.0–17.0)
LYMPHS PCT: 21.1 % (ref 12.0–46.0)
Lymphs Abs: 1.1 10*3/uL (ref 0.7–4.0)
MCHC: 33.7 g/dL (ref 30.0–36.0)
MCV: 95.7 fl (ref 78.0–100.0)
MONO ABS: 1 10*3/uL (ref 0.1–1.0)
NEUTROS PCT: 56.8 % (ref 43.0–77.0)
Neutro Abs: 3 10*3/uL (ref 1.4–7.7)
Platelets: 216 10*3/uL (ref 150.0–400.0)
RBC: 3.57 Mil/uL — AB (ref 4.22–5.81)
RDW: 16.1 % — ABNORMAL HIGH (ref 11.5–15.5)
WBC: 5.3 10*3/uL (ref 4.0–10.5)

## 2015-05-06 LAB — BASIC METABOLIC PANEL
BUN: 24 mg/dL — ABNORMAL HIGH (ref 6–23)
CALCIUM: 9.8 mg/dL (ref 8.4–10.5)
CO2: 36 meq/L — AB (ref 19–32)
CREATININE: 6.89 mg/dL — AB (ref 0.40–1.50)
Chloride: 95 mEq/L — ABNORMAL LOW (ref 96–112)
GFR: 9.87 mL/min — CL (ref >60.00)
GLUCOSE: 92 mg/dL (ref 70–99)
Potassium: 4.5 mEq/L (ref 3.5–5.1)
Sodium: 140 mEq/L (ref 135–145)

## 2015-05-06 LAB — LIPID PANEL
Cholesterol: 233 mg/dL — ABNORMAL HIGH (ref 0–200)
HDL: 84.8 mg/dL (ref >39.00)
LDL Cholesterol: 133 mg/dL — ABNORMAL HIGH (ref 0–99)
NonHDL: 148.2
Total CHOL/HDL Ratio: 3
Triglycerides: 74 mg/dL (ref 0.0–149.0)
VLDL: 14.8 mg/dL (ref 0.0–40.0)

## 2015-05-06 LAB — HEPATIC FUNCTION PANEL
ALT: 11 U/L (ref 0–53)
AST: 15 U/L (ref 0–37)
Albumin: 4.3 g/dL (ref 3.5–5.2)
Alkaline Phosphatase: 67 U/L (ref 39–117)
Bilirubin, Direct: 0.1 mg/dL (ref 0.0–0.3)
Total Bilirubin: 0.5 mg/dL (ref 0.2–1.2)
Total Protein: 7.2 g/dL (ref 6.0–8.3)

## 2015-05-06 LAB — TSH: TSH: 0.58 u[IU]/mL (ref 0.35–4.50)

## 2015-05-06 MED ORDER — TIZANIDINE HCL 4 MG PO TABS: 4.0000 mg | ORAL_TABLET | Freq: Four times a day (QID) | ORAL | Status: DC | PRN

## 2015-05-06 NOTE — Progress Notes (Signed)
Pre visit review using our clinic review tool, if applicable. No additional management support is needed unless otherwise documented below in the visit note. 

## 2015-05-06 NOTE — Assessment & Plan Note (Signed)
?   Mild worsening, declines med change today,  to f/u any worsening symptoms or concerns, delcines counseling

## 2015-05-06 NOTE — Progress Notes (Signed)
Subjective:    Patient ID: [name], male    DOB: 11-03-1929, 81 y.o.   MRN: [id number]  HPI  Here for wellness and f/u;  Overall doing ok;  Pt denies Chest pain, worsening SOB, DOE, wheezing, orthopnea, PND, worsening LE edema, palpitations, dizziness or syncope.  Pt denies neurological change such as new headache, facial or extremity weakness.  Pt denies polydipsia, polyuria, or low sugar symptoms. Pt states overall good compliance with treatment and medications, good tolerability, and has been trying to follow appropriate diet.  Pt denies worsening depressive symptoms, suicidal ideation or panic. No fever, night sweats, wt loss, loss of appetite, or other constitutional symptoms.  Pt states good ability with ADL's, has low fall risk, home safety reviewed and adequate, no other significant changes in hearing or vision, and only occasionally active with exercise. On HD - m-w-f.  Has small urine ouput once in am only to first get up. Has had some lightening of the skin near the nasolabial folds bilat - he thinks may be vitiligo per derm, seen per derm advised for exam today here as well with cbc and tsh.  Pt also continues to have recurring left LBP without change in severity, bowel or bladder change, fever, wt loss,  worsening LE pain/numbness/weakness, gait change or falls.c Declines prevnar today Past Medical History  Diagnosis Date  . ANEMIA-IRON DEFICIENCY 03/09/2008  . DIVERTICULOSIS, COLON 03/09/2008  . FATIGUE 03/09/2008  . GOUT 03/09/2008  . HYPERLIPIDEMIA 03/09/2008  . HYPERTENSION 03/09/2008  . PROSTATE CANCER, HX OF 03/09/2008  . RENAL INSUFFICIENCY 03/09/2008  . SHOULDER PAIN, LEFT 06/03/2010  . Complex renal cyst 06/19/2011  . Hyperparathyroidism   . Cancer     prostate  . Arthritis   . History of blood transfusion   . Depression 03/26/2014   Past Surgical History  Procedure Laterality Date  . Tonsillectomy    . Rotator cuff repair right  4/08  . S/p left hip replacement  2007    Dr.  Percell Miller ortho  . Joint replacement      HIP  . Av fistula placement Left 01/24/2013    Procedure: INSERTION OF ARTERIOVENOUS (AV) GORE-TEX GRAFT ARM;  Surgeon: Rosetta Posner, MD;  Location: [place];  Service: Vascular;  Laterality: Left;    reports that he quit smoking about 32 years ago. His smoking use included Cigarettes. He quit after 5 years of use. He has never used smokeless tobacco. He reports that he does not drink alcohol or use illicit drugs. family history includes Cancer in his father; Diabetes in his father; Hypertension in his brother and father. No Known Allergies Current Outpatient Prescriptions on File Prior to Visit  Medication Sig Dispense Refill  . aspirin 81 MG tablet Take 81 mg by mouth daily.     Marland Kitchen atenolol (TENORMIN) 50 MG tablet TAKE 1/2 TABLET DAILY. 45 tablet 3  . citalopram (CELEXA) 10 MG tablet Take 1 tablet (10 mg total) by mouth daily. 90 tablet 3  . doxercalciferol (HECTOROL) 0.5 MCG capsule Take 1.5 mcg by mouth daily.     . rosuvastatin (CRESTOR) 20 MG tablet Take 1 tablet (20 mg total) by mouth at bedtime. 90 tablet 3  . allopurinol (ZYLOPRIM) 100 MG tablet TAKE 1 TABLET DAILY (Patient not taking: Reported on 05/06/2015) 90 tablet 3  . amLODipine (NORVASC) 5 MG tablet Take 1 tablet (5 mg total) by mouth daily. (Patient not taking: Reported on 05/06/2015) 90 tablet 3   No current  facility-administered medications on file prior to visit.     Review of Systems Constitutional: Negative for increased diaphoresis, other activity, appetite or siginficant weight change other than noted HENT: Negative for worsening hearing loss, ear pain, facial swelling, mouth sores and neck stiffness.   Eyes: Negative for other worsening pain, redness or visual disturbance.  Respiratory: Negative for shortness of breath and wheezing  Cardiovascular: Negative for chest pain and palpitations.  Gastrointestinal: Negative for diarrhea, blood in stool, abdominal distention or other  pain Genitourinary: Negative for hematuria, flank pain or change in urine volume.  Musculoskeletal: Negative for myalgias or other joint complaints.  Skin: Negative for color change and wound or drainage.  Neurological: Negative for syncope and numbness. other than noted Hematological: Negative for adenopathy. or other swelling Psychiatric/Behavioral: Negative for hallucinations, SI, self-injury, decreased concentration or other worsening agitation.      Objective:   Physical Exam BP 122/66 mmHg  Pulse 87  Temp(Src) 98.4 F (36.9 C) (Oral)  Ht 5\' 8"  (1.727 m)  Wt 153 lb (69.4 kg)  BMI 23.27 kg/m2  SpO2 99% VS noted,  Constitutional: Pt is oriented to person, place, and time. Appears well-developed and well-nourished, in no significant distress Head: Normocephalic and atraumatic.  Right Ear: External ear normal.  Left Ear: External ear normal.  Nose: Nose normal.  Mouth/Throat: Oropharynx is clear and moist.  Eyes: Conjunctivae and EOM are normal. Pupils are equal, round, and reactive to light.  Neck: Normal range of motion. Neck supple. No JVD present. No tracheal deviation present or significant neck LA or mass Cardiovascular: Normal rate, regular rhythm with prob ectopy, normal heart sounds and intact distal pulses.   Pulmonary/Chest: Effort normal and breath sounds without rales or wheezing  Abdominal: Soft. Bowel sounds are normal. NT. No HSM  Musculoskeletal: Normal range of motion. Exhibits no edema.  Lymphadenopathy:  Has no cervical adenopathy.  Neurological: Pt is alert and oriented to person, place, and time. Pt has normal reflexes. No cranial nerve deficit. Motor grossly intact Skin: Skin is warm and dry. No rash noted.  Spine nontender, has some muscular left paravertebral spasm/tender Psychiatric:  Has mild depressed mood and affect. Behavior is normal.     Assessment & Plan:

## 2015-05-06 NOTE — Assessment & Plan Note (Signed)
stable overall by history and exam, recent data reviewed with pt, and pt to continue medical treatment as before,  to f/u any worsening symptoms or concerns BP Readings from Last 3 Encounters:  05/06/15 122/66  04/13/15 138/52  09/29/14 140/62

## 2015-05-06 NOTE — Telephone Encounter (Signed)
Critical Value - Creatinine 6.89, GFR 9.87

## 2015-05-06 NOTE — Addendum Note (Signed)
Addended by: [name] on: 05/06/2015 09:26 AM   Modules accepted: Orders, SmartSet

## 2015-05-06 NOTE — Addendum Note (Signed)
Addended by: [name] on: 05/06/2015 09:19 AM   Modules accepted: Level of Service, SmartSet

## 2015-05-06 NOTE — Assessment & Plan Note (Addendum)
ECG reviewed as per emr., benign, ok to follow, consider incrased BB if symptoms  Note:  Total time for pt hx, exam, review of record with pt in the room, determination of diagnoses and plan for further eval and tx is > 40 min, with over 50% spent in coordination and counseling of patient

## 2015-05-06 NOTE — Assessment & Plan Note (Addendum)
Mild to mod, for ls spins films as have never been done, suspect related to underlying ls spine DJD or ddd,  to f/u any worsening symptoms or concerns, tylenol prn, also for muscle relaxer prn

## 2015-05-06 NOTE — Assessment & Plan Note (Signed)
stable overall by history and exam, recent data reviewed with pt, and pt to continue medical treatment as before,  to f/u any worsening symptoms or concerns Lab Results  Component Value Date   LDLCALC 97 07/26/2012

## 2015-05-06 NOTE — Patient Instructions (Addendum)
Your EKG was OK today  Please take all new medication as prescribed - the muscle relaxer  Please continue all other medications as before, and refills have been done if requested.  Please have the pharmacy call with any other refills you may need.  Please continue your efforts at being more active, low cholesterol diet, and weight control.  You are otherwise up to date with prevention measures today.  Please keep your appointments with your specialists as you may have planned  Please go to the XRAY Department in the Basement (go straight as you get off the elevator) for the x-ray testing  Please go to the LAB in the Basement (turn left off the elevator) for the tests to be done today  You will be contacted by phone if any changes need to be made immediately.  Otherwise, you will receive a letter about your results with an explanation, but please check with MyChart first.  Please remember to sign up for MyChart if you have not done so, as this will be important to you in the future with finding out test results, communicating by private email, and scheduling acute appointments online when needed.  Please return in 6 months, or sooner if needed

## 2015-05-07 DIAGNOSIS — N186 End stage renal disease: Secondary | ICD-10-CM | POA: Diagnosis not present

## 2015-05-07 DIAGNOSIS — D631 Anemia in chronic kidney disease: Secondary | ICD-10-CM | POA: Diagnosis not present

## 2015-05-07 DIAGNOSIS — N2581 Secondary hyperparathyroidism of renal origin: Secondary | ICD-10-CM | POA: Diagnosis not present

## 2015-05-10 DIAGNOSIS — D631 Anemia in chronic kidney disease: Secondary | ICD-10-CM | POA: Diagnosis not present

## 2015-05-10 DIAGNOSIS — N2581 Secondary hyperparathyroidism of renal origin: Secondary | ICD-10-CM | POA: Diagnosis not present

## 2015-05-10 DIAGNOSIS — N186 End stage renal disease: Secondary | ICD-10-CM | POA: Diagnosis not present

## 2015-05-12 DIAGNOSIS — N186 End stage renal disease: Secondary | ICD-10-CM | POA: Diagnosis not present

## 2015-05-12 DIAGNOSIS — D631 Anemia in chronic kidney disease: Secondary | ICD-10-CM | POA: Diagnosis not present

## 2015-05-12 DIAGNOSIS — N2581 Secondary hyperparathyroidism of renal origin: Secondary | ICD-10-CM | POA: Diagnosis not present

## 2015-05-14 DIAGNOSIS — N186 End stage renal disease: Secondary | ICD-10-CM | POA: Diagnosis not present

## 2015-05-14 DIAGNOSIS — N2581 Secondary hyperparathyroidism of renal origin: Secondary | ICD-10-CM | POA: Diagnosis not present

## 2015-05-14 DIAGNOSIS — D631 Anemia in chronic kidney disease: Secondary | ICD-10-CM | POA: Diagnosis not present

## 2015-05-17 DIAGNOSIS — D631 Anemia in chronic kidney disease: Secondary | ICD-10-CM | POA: Diagnosis not present

## 2015-05-17 DIAGNOSIS — N186 End stage renal disease: Secondary | ICD-10-CM | POA: Diagnosis not present

## 2015-05-17 DIAGNOSIS — N2581 Secondary hyperparathyroidism of renal origin: Secondary | ICD-10-CM | POA: Diagnosis not present

## 2015-05-19 DIAGNOSIS — N2581 Secondary hyperparathyroidism of renal origin: Secondary | ICD-10-CM | POA: Diagnosis not present

## 2015-05-19 DIAGNOSIS — N186 End stage renal disease: Secondary | ICD-10-CM | POA: Diagnosis not present

## 2015-05-19 DIAGNOSIS — D631 Anemia in chronic kidney disease: Secondary | ICD-10-CM | POA: Diagnosis not present

## 2015-05-21 DIAGNOSIS — T82858D Stenosis of vascular prosthetic devices, implants and grafts, subsequent encounter: Secondary | ICD-10-CM | POA: Diagnosis not present

## 2015-05-21 DIAGNOSIS — Z992 Dependence on renal dialysis: Secondary | ICD-10-CM | POA: Diagnosis not present

## 2015-05-21 DIAGNOSIS — N186 End stage renal disease: Secondary | ICD-10-CM | POA: Diagnosis not present

## 2015-05-21 DIAGNOSIS — R2232 Localized swelling, mass and lump, left upper limb: Secondary | ICD-10-CM | POA: Diagnosis not present

## 2015-05-22 DIAGNOSIS — N2581 Secondary hyperparathyroidism of renal origin: Secondary | ICD-10-CM | POA: Diagnosis not present

## 2015-05-22 DIAGNOSIS — N186 End stage renal disease: Secondary | ICD-10-CM | POA: Diagnosis not present

## 2015-05-22 DIAGNOSIS — D631 Anemia in chronic kidney disease: Secondary | ICD-10-CM | POA: Diagnosis not present

## 2015-05-24 DIAGNOSIS — N186 End stage renal disease: Secondary | ICD-10-CM | POA: Diagnosis not present

## 2015-05-24 DIAGNOSIS — N2581 Secondary hyperparathyroidism of renal origin: Secondary | ICD-10-CM | POA: Diagnosis not present

## 2015-05-24 DIAGNOSIS — D631 Anemia in chronic kidney disease: Secondary | ICD-10-CM | POA: Diagnosis not present

## 2015-05-26 DIAGNOSIS — N2581 Secondary hyperparathyroidism of renal origin: Secondary | ICD-10-CM | POA: Diagnosis not present

## 2015-05-26 DIAGNOSIS — D631 Anemia in chronic kidney disease: Secondary | ICD-10-CM | POA: Diagnosis not present

## 2015-05-26 DIAGNOSIS — N186 End stage renal disease: Secondary | ICD-10-CM | POA: Diagnosis not present

## 2015-05-27 DIAGNOSIS — C61 Malignant neoplasm of prostate: Secondary | ICD-10-CM | POA: Diagnosis not present

## 2015-05-28 DIAGNOSIS — N186 End stage renal disease: Secondary | ICD-10-CM | POA: Diagnosis not present

## 2015-05-28 DIAGNOSIS — D631 Anemia in chronic kidney disease: Secondary | ICD-10-CM | POA: Diagnosis not present

## 2015-05-28 DIAGNOSIS — N2581 Secondary hyperparathyroidism of renal origin: Secondary | ICD-10-CM | POA: Diagnosis not present

## 2015-05-31 DIAGNOSIS — D631 Anemia in chronic kidney disease: Secondary | ICD-10-CM | POA: Diagnosis not present

## 2015-05-31 DIAGNOSIS — N186 End stage renal disease: Secondary | ICD-10-CM | POA: Diagnosis not present

## 2015-05-31 DIAGNOSIS — N2581 Secondary hyperparathyroidism of renal origin: Secondary | ICD-10-CM | POA: Diagnosis not present

## 2015-06-02 DIAGNOSIS — N2581 Secondary hyperparathyroidism of renal origin: Secondary | ICD-10-CM | POA: Diagnosis not present

## 2015-06-02 DIAGNOSIS — C61 Malignant neoplasm of prostate: Secondary | ICD-10-CM | POA: Diagnosis not present

## 2015-06-02 DIAGNOSIS — N186 End stage renal disease: Secondary | ICD-10-CM | POA: Diagnosis not present

## 2015-06-02 DIAGNOSIS — D631 Anemia in chronic kidney disease: Secondary | ICD-10-CM | POA: Diagnosis not present

## 2015-06-03 DIAGNOSIS — E291 Testicular hypofunction: Secondary | ICD-10-CM | POA: Diagnosis not present

## 2015-06-03 DIAGNOSIS — R3915 Urgency of urination: Secondary | ICD-10-CM | POA: Diagnosis not present

## 2015-06-03 DIAGNOSIS — C61 Malignant neoplasm of prostate: Secondary | ICD-10-CM | POA: Diagnosis not present

## 2015-06-04 DIAGNOSIS — N2581 Secondary hyperparathyroidism of renal origin: Secondary | ICD-10-CM | POA: Diagnosis not present

## 2015-06-04 DIAGNOSIS — D631 Anemia in chronic kidney disease: Secondary | ICD-10-CM | POA: Diagnosis not present

## 2015-06-04 DIAGNOSIS — N186 End stage renal disease: Secondary | ICD-10-CM | POA: Diagnosis not present

## 2015-06-06 DIAGNOSIS — Z992 Dependence on renal dialysis: Secondary | ICD-10-CM | POA: Diagnosis not present

## 2015-06-06 DIAGNOSIS — I158 Other secondary hypertension: Secondary | ICD-10-CM | POA: Diagnosis not present

## 2015-06-06 DIAGNOSIS — N186 End stage renal disease: Secondary | ICD-10-CM | POA: Diagnosis not present

## 2015-06-07 ENCOUNTER — Other Ambulatory Visit: Payer: Self-pay | Admitting: Urology

## 2015-06-07 DIAGNOSIS — N2581 Secondary hyperparathyroidism of renal origin: Secondary | ICD-10-CM | POA: Diagnosis not present

## 2015-06-07 DIAGNOSIS — D631 Anemia in chronic kidney disease: Secondary | ICD-10-CM | POA: Diagnosis not present

## 2015-06-07 DIAGNOSIS — C61 Malignant neoplasm of prostate: Secondary | ICD-10-CM

## 2015-06-07 DIAGNOSIS — N186 End stage renal disease: Secondary | ICD-10-CM | POA: Diagnosis not present

## 2015-06-09 DIAGNOSIS — N186 End stage renal disease: Secondary | ICD-10-CM | POA: Diagnosis not present

## 2015-06-09 DIAGNOSIS — N2581 Secondary hyperparathyroidism of renal origin: Secondary | ICD-10-CM | POA: Diagnosis not present

## 2015-06-09 DIAGNOSIS — D631 Anemia in chronic kidney disease: Secondary | ICD-10-CM | POA: Diagnosis not present

## 2015-06-11 DIAGNOSIS — D631 Anemia in chronic kidney disease: Secondary | ICD-10-CM | POA: Diagnosis not present

## 2015-06-11 DIAGNOSIS — N2581 Secondary hyperparathyroidism of renal origin: Secondary | ICD-10-CM | POA: Diagnosis not present

## 2015-06-11 DIAGNOSIS — N186 End stage renal disease: Secondary | ICD-10-CM | POA: Diagnosis not present

## 2015-06-14 ENCOUNTER — Inpatient Hospital Stay (HOSPITAL_COMMUNITY)
Admission: EM | Admit: 2015-06-14 | Discharge: 2015-06-22 | DRG: 377 | Disposition: A | Discharge status: Home Health | Payer: Medicare Other | Attending: Internal Medicine | Admitting: Internal Medicine

## 2015-06-14 ENCOUNTER — Encounter (HOSPITAL_COMMUNITY): Payer: Self-pay | Admitting: Emergency Medicine

## 2015-06-14 DIAGNOSIS — Z6822 Body mass index (BMI) 22.0-22.9, adult: Secondary | ICD-10-CM

## 2015-06-14 DIAGNOSIS — K921 Melena: Secondary | ICD-10-CM

## 2015-06-14 DIAGNOSIS — N186 End stage renal disease: Secondary | ICD-10-CM | POA: Diagnosis not present

## 2015-06-14 DIAGNOSIS — R55 Syncope and collapse: Secondary | ICD-10-CM | POA: Diagnosis not present

## 2015-06-14 DIAGNOSIS — Z87891 Personal history of nicotine dependence: Secondary | ICD-10-CM

## 2015-06-14 DIAGNOSIS — K648 Other hemorrhoids: Secondary | ICD-10-CM | POA: Diagnosis present

## 2015-06-14 DIAGNOSIS — I959 Hypotension, unspecified: Secondary | ICD-10-CM | POA: Diagnosis present

## 2015-06-14 DIAGNOSIS — Z923 Personal history of irradiation: Secondary | ICD-10-CM

## 2015-06-14 DIAGNOSIS — I120 Hypertensive chronic kidney disease with stage 5 chronic kidney disease or end stage renal disease: Secondary | ICD-10-CM | POA: Diagnosis not present

## 2015-06-14 DIAGNOSIS — D62 Acute posthemorrhagic anemia: Secondary | ICD-10-CM | POA: Diagnosis present

## 2015-06-14 DIAGNOSIS — Z992 Dependence on renal dialysis: Secondary | ICD-10-CM

## 2015-06-14 DIAGNOSIS — M898X9 Other specified disorders of bone, unspecified site: Secondary | ICD-10-CM | POA: Diagnosis present

## 2015-06-14 DIAGNOSIS — Z8546 Personal history of malignant neoplasm of prostate: Secondary | ICD-10-CM

## 2015-06-14 DIAGNOSIS — E46 Unspecified protein-calorie malnutrition: Secondary | ICD-10-CM | POA: Diagnosis present

## 2015-06-14 DIAGNOSIS — N2581 Secondary hyperparathyroidism of renal origin: Secondary | ICD-10-CM | POA: Diagnosis not present

## 2015-06-14 DIAGNOSIS — K922 Gastrointestinal hemorrhage, unspecified: Secondary | ICD-10-CM | POA: Diagnosis not present

## 2015-06-14 DIAGNOSIS — E785 Hyperlipidemia, unspecified: Secondary | ICD-10-CM | POA: Diagnosis present

## 2015-06-14 DIAGNOSIS — Z96642 Presence of left artificial hip joint: Secondary | ICD-10-CM | POA: Diagnosis present

## 2015-06-14 DIAGNOSIS — K5791 Diverticulosis of intestine, part unspecified, without perforation or abscess with bleeding: Secondary | ICD-10-CM | POA: Diagnosis not present

## 2015-06-14 DIAGNOSIS — D631 Anemia in chronic kidney disease: Secondary | ICD-10-CM | POA: Diagnosis not present

## 2015-06-14 DIAGNOSIS — K5731 Diverticulosis of large intestine without perforation or abscess with bleeding: Secondary | ICD-10-CM | POA: Insufficient documentation

## 2015-06-14 DIAGNOSIS — Z79899 Other long term (current) drug therapy: Secondary | ICD-10-CM

## 2015-06-14 DIAGNOSIS — D124 Benign neoplasm of descending colon: Secondary | ICD-10-CM | POA: Diagnosis present

## 2015-06-14 DIAGNOSIS — K2970 Gastritis, unspecified, without bleeding: Secondary | ICD-10-CM | POA: Diagnosis present

## 2015-06-14 DIAGNOSIS — E861 Hypovolemia: Secondary | ICD-10-CM | POA: Diagnosis present

## 2015-06-14 DIAGNOSIS — Z7982 Long term (current) use of aspirin: Secondary | ICD-10-CM

## 2015-06-14 HISTORY — DX: End stage renal disease: N18.6

## 2015-06-14 HISTORY — DX: Malignant neoplasm of prostate: C61

## 2015-06-14 HISTORY — DX: Irradiation cystitis without hematuria: N30.40

## 2015-06-14 HISTORY — DX: Unspecified hemorrhoids: K64.9

## 2015-06-14 LAB — CBG MONITORING, ED: Glucose-Capillary: 110 mg/dL — ABNORMAL HIGH (ref 65–99)

## 2015-06-14 NOTE — ED Notes (Signed)
Pt presents with PTAR who reports pt having a syncopal episode in the kitchen resulting in fall; family reports patient was unconscious for 3 seconds; pt denies striking head or other injuries; pt is also a hemodialysis patient with access in R upper arm-MWF; pt reports completing full dialysis tx today; pt also reports constipation today; PTAR reports bloody stool on patients clothing; pt CAOx4 at this time; VSS

## 2015-06-14 NOTE — ED Notes (Signed)
Pts CBG 110

## 2015-06-14 NOTE — ED Notes (Signed)
Red blood noted on pts pants where his bottom would be,

## 2015-06-14 NOTE — ED Provider Notes (Signed)
CSN: [id number]     Arrival date & time 06/14/15  2319 History  This chart was scribed for Evelina Bucy, MD by Randa Evens, ED Scribe. This patient was seen in room A01C/A01C and the patient's care was started at 11:33 PM.    Chief Complaint  Patient presents with  . Loss of Consciousness  . Fall  . GI Bleeding   Patient is a 81 y.o. male presenting with syncope and fall. The history is provided by the patient. No language interpreter was used.  Loss of Consciousness Episode history:  Single Most recent episode:  Today Duration:  2 minutes Progression:  Resolved Chronicity:  New Context: standing up   Relieved by:  None tried Worsened by:  Nothing tried Ineffective treatments:  None tried Associated symptoms: no nausea, no shortness of breath and no vomiting   Fall Pertinent negatives include no abdominal pain and no shortness of breath.   HPI Comments: [name] is a 81 y.o. male brought in by ambulance, who presents to the Emergency Department complaining of new LOC onset tonight PTA. Pt states that when he stood up to get some water he lost consciousness. Pt states that after regaining consciousness he felt "sleepy" afterwards. Wife states that initially when he fell he was unresponsive for about 2-3 minutes. Wife states that she is unsure of he hit his head. Pt reports having some constipation this morning. Pt presents with blood in his stool. Denies nausea, vomiting, SOB or abdominal pain. Pt reports normal dialysis today. WIfe states that he was having trouble with hypotension about 3-4 weeks prior and was taken off of his hypertension medication.  Denies being on blood thinners.  Past Medical History  Diagnosis Date  . ANEMIA-IRON DEFICIENCY 03/09/2008  . DIVERTICULOSIS, COLON 03/09/2008  . FATIGUE 03/09/2008  . GOUT 03/09/2008  . HYPERLIPIDEMIA 03/09/2008  . HYPERTENSION 03/09/2008  . PROSTATE CANCER, HX OF 03/09/2008  . RENAL INSUFFICIENCY 03/09/2008  . SHOULDER PAIN, LEFT  06/03/2010  . Complex renal cyst 06/19/2011  . Hyperparathyroidism   . Cancer     prostate  . Arthritis   . History of blood transfusion   . Depression 03/26/2014   Past Surgical History  Procedure Laterality Date  . Tonsillectomy    . Rotator cuff repair right  4/08  . S/p left hip replacement  2007    Dr. Percell Miller ortho  . Joint replacement      HIP  . Av fistula placement Left 01/24/2013    Procedure: INSERTION OF ARTERIOVENOUS (AV) GORE-TEX GRAFT ARM;  Surgeon: Rosetta Posner, MD;  Location: [facility];  Service: Vascular;  Laterality: Left;   Family History  Problem Relation Age of Onset  . Hypertension Father   . Diabetes Father   . Cancer Father     prostate cancer  . Hypertension Brother    History  Substance Use Topics  . Smoking status: Former Smoker -- 5 years    Types: Cigarettes    Quit date: 09/17/1982  . Smokeless tobacco: Never Used  . Alcohol Use: No     Comment: rare    Review of Systems  Respiratory: Negative for shortness of breath.   Cardiovascular: Positive for syncope.  Gastrointestinal: Positive for blood in stool. Negative for nausea, vomiting and abdominal pain.  Neurological: Positive for syncope.  All other systems reviewed and are negative.     Allergies  Review of patient's allergies indicates no known allergies.  Home Medications   Prior to  Admission medications   Medication Sig Start Date End Date Taking? Authorizing Provider  allopurinol (ZYLOPRIM) 100 MG tablet TAKE 1 TABLET DAILY Patient not taking: Reported on 05/06/2015 10/14/14   [name], MD  amLODipine (NORVASC) 5 MG tablet Take 1 tablet (5 mg total) by mouth daily. Patient not taking: Reported on 05/06/2015 04/13/15 04/12/16  [name], MD  aspirin 81 MG tablet Take 81 mg by mouth daily.     Historical Provider, MD  atenolol (TENORMIN) 50 MG tablet TAKE 1/2 TABLET DAILY. 04/13/15   [name], MD  citalopram (CELEXA) 10 MG tablet Take 1 tablet (10 mg total) by mouth daily.  05/14/14   [name], MD  doxercalciferol (HECTOROL) 0.5 MCG capsule Take 1.5 mcg by mouth daily.     Historical Provider, MD  rosuvastatin (CRESTOR) 20 MG tablet Take 1 tablet (20 mg total) by mouth at bedtime. 05/13/14   [name], MD  tiZANidine (ZANAFLEX) 4 MG tablet Take 1 tablet (4 mg total) by mouth every 6 (six) hours as needed for muscle spasms. 05/06/15   [name], MD   BP 138/52 mmHg  Pulse 73  Temp(Src) 97.7 F (36.5 C) (Oral)  Resp 18  SpO2 100%   Physical Exam  Constitutional: He is oriented to person, place, and time. He appears well-developed and well-nourished. No distress.  HENT:  Head: Normocephalic and atraumatic.  Mouth/Throat: No oropharyngeal exudate.  Eyes: EOM are normal. Pupils are equal, round, and reactive to light.  Neck: Normal range of motion. Neck supple.  Cardiovascular: Normal rate and regular rhythm.  Exam reveals no friction rub.   No murmur heard. Pulmonary/Chest: Effort normal and breath sounds normal. No respiratory distress. He has no wheezes. He has no rales.  Abdominal: He exhibits no distension. There is no tenderness. There is no rebound.  Musculoskeletal: Normal range of motion. He exhibits no edema.  Neurological: He is alert and oriented to person, place, and time.  Skin: He is not diaphoretic.  Nursing note and vitals reviewed.   ED Course  Procedures (including critical care time) DIAGNOSTIC STUDIES: Oxygen Saturation is 99% on RA, normal by my interpretation.    COORDINATION OF CARE: 11:50 PM-Discussed treatment plan with pt at bedside and pt agreed to plan.     Labs Review Labs Reviewed  BASIC METABOLIC PANEL - Abnormal; Notable for the following:    Chloride 94 (*)    Glucose, Bld 129 (*)    BUN 26 (*)    Creatinine, Ser 6.43 (*)    GFR calc non Af Amer 7 (*)    GFR calc Af Amer 8 (*)    All other components within normal limits  CBC - Abnormal; Notable for the following:    RBC 3.65 (*)    Hemoglobin 11.6 (*)     HCT 36.0 (*)    All other components within normal limits  CBG MONITORING, ED - Abnormal; Notable for the following:    Glucose-Capillary 110 (*)    All other components within normal limits  I-STAT CG4 LACTIC ACID, ED - Abnormal; Notable for the following:    Lactic Acid, Venous 2.79 (*)    All other components within normal limits  URINALYSIS, ROUTINE W REFLEX MICROSCOPIC (NOT AT [facility])  TYPE AND SCREEN    Imaging Review No results found.   EKG Interpretation   Date/Time:  Monday June 14 2015 23:24:11 EDT Ventricular Rate:  76 PR Interval:  184 QRS  Duration: 94 QT Interval:  430 QTC Calculation: 483 R Axis:   -50 Text Interpretation:  Sinus rhythm Inferior infarct, old Consider anterior  infarct No significant change since last tracing Confirmed by Mingo Amber  MD,  Cyndie Woodbeck [phone]) on 06/14/2015 11:31:05 PM      MDM   Final diagnoses:  Syncope, unspecified syncope type  Hematochezia   81 year old male here with a syncopal episode. Was noted to have large volume hematochezia prior to arrival. No history of GI bleeds. He hasn't dialysis patient, had a normal dialysis session today. He's feeling well now. Syncope happened after standing up and beginning to walk. He felt sleepy afterwards did not have any preceding symptoms. With large volume hematochezia, we'll plan for labs, admission.   I personally performed the services described in this documentation, which was scribed in my presence. The recorded information has been reviewed and is accurate.       Evelina Bucy, MD 06/15/15 [phone]

## 2015-06-14 NOTE — ED Notes (Signed)
Gross blood present with stool in pts pants. MD notified.

## 2015-06-14 NOTE — ED Notes (Signed)
Dr. Walden at bedside 

## 2015-06-15 ENCOUNTER — Encounter (HOSPITAL_COMMUNITY): Payer: Self-pay | Admitting: Internal Medicine

## 2015-06-15 ENCOUNTER — Inpatient Hospital Stay (HOSPITAL_COMMUNITY): Payer: Medicare Other

## 2015-06-15 ENCOUNTER — Encounter (HOSPITAL_COMMUNITY)
Admission: EM | Disposition: A | Discharge status: Home Health | Payer: Self-pay | Source: Home / Self Care | Attending: Internal Medicine

## 2015-06-15 DIAGNOSIS — I959 Hypotension, unspecified: Secondary | ICD-10-CM | POA: Diagnosis present

## 2015-06-15 DIAGNOSIS — K5731 Diverticulosis of large intestine without perforation or abscess with bleeding: Secondary | ICD-10-CM | POA: Insufficient documentation

## 2015-06-15 DIAGNOSIS — E46 Unspecified protein-calorie malnutrition: Secondary | ICD-10-CM | POA: Diagnosis present

## 2015-06-15 DIAGNOSIS — Z923 Personal history of irradiation: Secondary | ICD-10-CM | POA: Diagnosis not present

## 2015-06-15 DIAGNOSIS — N2581 Secondary hyperparathyroidism of renal origin: Secondary | ICD-10-CM | POA: Diagnosis present

## 2015-06-15 DIAGNOSIS — R55 Syncope and collapse: Secondary | ICD-10-CM | POA: Diagnosis present

## 2015-06-15 DIAGNOSIS — Z79899 Other long term (current) drug therapy: Secondary | ICD-10-CM | POA: Diagnosis not present

## 2015-06-15 DIAGNOSIS — K922 Gastrointestinal hemorrhage, unspecified: Secondary | ICD-10-CM | POA: Diagnosis present

## 2015-06-15 DIAGNOSIS — K921 Melena: Secondary | ICD-10-CM | POA: Diagnosis not present

## 2015-06-15 DIAGNOSIS — I120 Hypertensive chronic kidney disease with stage 5 chronic kidney disease or end stage renal disease: Secondary | ICD-10-CM | POA: Diagnosis present

## 2015-06-15 DIAGNOSIS — D62 Acute posthemorrhagic anemia: Secondary | ICD-10-CM | POA: Diagnosis not present

## 2015-06-15 DIAGNOSIS — Z87891 Personal history of nicotine dependence: Secondary | ICD-10-CM | POA: Diagnosis not present

## 2015-06-15 DIAGNOSIS — Z96642 Presence of left artificial hip joint: Secondary | ICD-10-CM | POA: Diagnosis present

## 2015-06-15 DIAGNOSIS — Z6822 Body mass index (BMI) 22.0-22.9, adult: Secondary | ICD-10-CM | POA: Diagnosis not present

## 2015-06-15 DIAGNOSIS — N186 End stage renal disease: Secondary | ICD-10-CM

## 2015-06-15 DIAGNOSIS — K648 Other hemorrhoids: Secondary | ICD-10-CM | POA: Diagnosis present

## 2015-06-15 DIAGNOSIS — K2970 Gastritis, unspecified, without bleeding: Secondary | ICD-10-CM | POA: Diagnosis not present

## 2015-06-15 DIAGNOSIS — Z992 Dependence on renal dialysis: Secondary | ICD-10-CM | POA: Diagnosis not present

## 2015-06-15 DIAGNOSIS — Z8546 Personal history of malignant neoplasm of prostate: Secondary | ICD-10-CM | POA: Diagnosis not present

## 2015-06-15 DIAGNOSIS — D631 Anemia in chronic kidney disease: Secondary | ICD-10-CM | POA: Diagnosis present

## 2015-06-15 DIAGNOSIS — D124 Benign neoplasm of descending colon: Secondary | ICD-10-CM | POA: Diagnosis not present

## 2015-06-15 DIAGNOSIS — I509 Heart failure, unspecified: Secondary | ICD-10-CM | POA: Diagnosis not present

## 2015-06-15 DIAGNOSIS — M898X9 Other specified disorders of bone, unspecified site: Secondary | ICD-10-CM | POA: Diagnosis present

## 2015-06-15 DIAGNOSIS — E785 Hyperlipidemia, unspecified: Secondary | ICD-10-CM | POA: Diagnosis present

## 2015-06-15 DIAGNOSIS — E861 Hypovolemia: Secondary | ICD-10-CM | POA: Diagnosis present

## 2015-06-15 DIAGNOSIS — Z7982 Long term (current) use of aspirin: Secondary | ICD-10-CM | POA: Diagnosis not present

## 2015-06-15 DIAGNOSIS — K5791 Diverticulosis of intestine, part unspecified, without perforation or abscess with bleeding: Secondary | ICD-10-CM | POA: Diagnosis present

## 2015-06-15 HISTORY — PX: FLEXIBLE SIGMOIDOSCOPY: SHX5431

## 2015-06-15 LAB — HEMOGLOBIN AND HEMATOCRIT, BLOOD
HEMATOCRIT: 27.4 % — AB (ref 39.0–52.0)
Hemoglobin: 9 g/dL — ABNORMAL LOW (ref 13.0–17.0)

## 2015-06-15 LAB — BASIC METABOLIC PANEL
Anion gap: 11 (ref 5–15)
BUN: 26 mg/dL — ABNORMAL HIGH (ref 6–20)
CO2: 32 mmol/L (ref 22–32)
CREATININE: 6.43 mg/dL — AB (ref 0.61–1.24)
Calcium: 8.9 mg/dL (ref 8.9–10.3)
Chloride: 94 mmol/L — ABNORMAL LOW (ref 101–111)
GFR calc Af Amer: 8 mL/min — ABNORMAL LOW (ref >60)
GFR calc non Af Amer: 7 mL/min — ABNORMAL LOW (ref >60)
GLUCOSE: 129 mg/dL — AB (ref 65–99)
Potassium: 4.3 mmol/L (ref 3.5–5.1)
Sodium: 137 mmol/L (ref 135–145)

## 2015-06-15 LAB — COMPREHENSIVE METABOLIC PANEL
ALT: 11 U/L — ABNORMAL LOW (ref 17–63)
AST: 15 U/L (ref 15–41)
Albumin: 3.4 g/dL — ABNORMAL LOW (ref 3.5–5.0)
Alkaline Phosphatase: 62 U/L (ref 38–126)
Anion gap: 11 (ref 5–15)
BUN: 33 mg/dL — AB (ref 6–20)
CHLORIDE: 95 mmol/L — AB (ref 101–111)
CO2: 29 mmol/L (ref 22–32)
CREATININE: 6.65 mg/dL — AB (ref 0.61–1.24)
Calcium: 8.7 mg/dL — ABNORMAL LOW (ref 8.9–10.3)
GFR calc non Af Amer: 7 mL/min — ABNORMAL LOW (ref >60)
GFR, EST AFRICAN AMERICAN: 8 mL/min — AB (ref >60)
Glucose, Bld: 98 mg/dL (ref 65–99)
Potassium: 4.6 mmol/L (ref 3.5–5.1)
Sodium: 135 mmol/L (ref 135–145)
TOTAL PROTEIN: 5.8 g/dL — AB (ref 6.5–8.1)
Total Bilirubin: 0.4 mg/dL (ref 0.3–1.2)

## 2015-06-15 LAB — CBC
HCT: 36 % — ABNORMAL LOW (ref 39.0–52.0)
HEMATOCRIT: 29.7 % — AB (ref 39.0–52.0)
HEMATOCRIT: 31.1 % — AB (ref 39.0–52.0)
Hemoglobin: 10.3 g/dL — ABNORMAL LOW (ref 13.0–17.0)
Hemoglobin: 11.6 g/dL — ABNORMAL LOW (ref 13.0–17.0)
Hemoglobin: 9.7 g/dL — ABNORMAL LOW (ref 13.0–17.0)
MCH: 32.3 pg (ref 26.0–34.0)
MCH: 31.4 pg (ref 26.0–34.0)
MCH: 31.8 pg (ref 26.0–34.0)
MCHC: 32.7 g/dL (ref 30.0–36.0)
MCHC: 33.1 g/dL (ref 30.0–36.0)
MCHC: 32.2 g/dL (ref 30.0–36.0)
MCV: 96.1 fL (ref 78.0–100.0)
MCV: 97.5 fL (ref 78.0–100.0)
MCV: 98.6 fL (ref 78.0–100.0)
PLATELETS: 161 10*3/uL (ref 150–400)
PLATELETS: 166 10*3/uL (ref 150–400)
Platelets: 157 10*3/uL (ref 150–400)
RBC: 3.09 MIL/uL — ABNORMAL LOW (ref 4.22–5.81)
RBC: 3.19 MIL/uL — ABNORMAL LOW (ref 4.22–5.81)
RBC: 3.65 MIL/uL — AB (ref 4.22–5.81)
RDW: 15.6 % — ABNORMAL HIGH (ref 11.5–15.5)
RDW: 15.4 % (ref 11.5–15.5)
RDW: 15.5 % (ref 11.5–15.5)
WBC: 5.6 10*3/uL (ref 4.0–10.5)
WBC: 6.3 10*3/uL (ref 4.0–10.5)
WBC: 6.8 10*3/uL (ref 4.0–10.5)

## 2015-06-15 LAB — I-STAT CG4 LACTIC ACID, ED: LACTIC ACID, VENOUS: 2.79 mmol/L — AB (ref 0.5–2.0)

## 2015-06-15 LAB — GLUCOSE, CAPILLARY
GLUCOSE-CAPILLARY: 129 mg/dL — AB (ref 65–99)
Glucose-Capillary: 90 mg/dL (ref 65–99)
Glucose-Capillary: 101 mg/dL — ABNORMAL HIGH (ref 65–99)
Glucose-Capillary: 104 mg/dL — ABNORMAL HIGH (ref 65–99)

## 2015-06-15 LAB — TROPONIN I
TROPONIN I: 0.05 ng/mL — AB (ref <0.031)
TROPONIN I: 0.05 ng/mL — AB (ref <0.031)

## 2015-06-15 SURGERY — SIGMOIDOSCOPY, FLEXIBLE
Anesthesia: Moderate Sedation

## 2015-06-15 MED ORDER — SODIUM CHLORIDE 0.9 % IV BOLUS (SEPSIS)
500.0000 mL | Freq: Once | INTRAVENOUS | Status: AC
  Administered 2015-06-15: 500 mL via INTRAVENOUS

## 2015-06-15 MED ORDER — MIDAZOLAM HCL 10 MG/2ML IJ SOLN
INTRAMUSCULAR | Status: DC | PRN
  Administered 2015-06-15 (×3): 1 mg via INTRAVENOUS

## 2015-06-15 MED ORDER — PANTOPRAZOLE SODIUM 40 MG IV SOLR
40.0000 mg | Freq: Two times a day (BID) | INTRAVENOUS | Status: DC
  Administered 2015-06-15 – 2015-06-21 (×13): 40 mg via INTRAVENOUS
  Filled 2015-06-15 (×13): qty 40

## 2015-06-15 MED ORDER — ACETAMINOPHEN 650 MG RE SUPP: 650.0000 mg | Freq: Four times a day (QID) | RECTAL | Status: DC | PRN

## 2015-06-15 MED ORDER — PANTOPRAZOLE SODIUM 40 MG IV SOLR
40.0000 mg | Freq: Once | INTRAVENOUS | Status: AC
  Administered 2015-06-15: 40 mg via INTRAVENOUS
  Filled 2015-06-15: qty 40

## 2015-06-15 MED ORDER — FENTANYL CITRATE (PF) 100 MCG/2ML IJ SOLN
INTRAMUSCULAR | Status: AC
  Filled 2015-06-15: qty 2

## 2015-06-15 MED ORDER — ONDANSETRON HCL 4 MG PO TABS: 4.0000 mg | ORAL_TABLET | Freq: Four times a day (QID) | ORAL | Status: DC | PRN

## 2015-06-15 MED ORDER — SODIUM CHLORIDE 0.9 % IV SOLN: INTRAVENOUS | Status: DC

## 2015-06-15 MED ORDER — ACETAMINOPHEN 325 MG PO TABS: 650.0000 mg | ORAL_TABLET | Freq: Four times a day (QID) | ORAL | Status: DC | PRN

## 2015-06-15 MED ORDER — SODIUM CHLORIDE 0.9 % IJ SOLN
3.0000 mL | Freq: Two times a day (BID) | INTRAMUSCULAR | Status: DC
  Administered 2015-06-15 – 2015-06-21 (×11): 3 mL via INTRAVENOUS

## 2015-06-15 MED ORDER — DIPHENHYDRAMINE HCL 50 MG/ML IJ SOLN
INTRAMUSCULAR | Status: AC
  Filled 2015-06-15: qty 1

## 2015-06-15 MED ORDER — CALCITRIOL 0.5 MCG PO CAPS
1.5000 ug | ORAL_CAPSULE | ORAL | Status: DC
  Administered 2015-06-16 – 2015-06-21 (×3): 1.5 ug via ORAL
  Filled 2015-06-15: qty 3

## 2015-06-15 MED ORDER — FENTANYL CITRATE (PF) 100 MCG/2ML IJ SOLN
INTRAMUSCULAR | Status: DC | PRN
  Administered 2015-06-15 (×2): 12.5 ug via INTRAVENOUS

## 2015-06-15 MED ORDER — ONDANSETRON HCL 4 MG/2ML IJ SOLN: 4.0000 mg | Freq: Four times a day (QID) | INTRAMUSCULAR | Status: DC | PRN

## 2015-06-15 MED ORDER — MIDAZOLAM HCL 5 MG/ML IJ SOLN
INTRAMUSCULAR | Status: AC
  Filled 2015-06-15: qty 1

## 2015-06-15 NOTE — Progress Notes (Signed)
Admission note:  Arrival Method: Pt arrived on stretcher from ED with RN Mental Orientation: Alert and oriented x 4 Telemetry: Telemetry box 6e20 applied. CCMD notified.  Assessment: Completed, see doc flowsheets.  Skin: Dry and intact with no open wounds or sores noted.  IV: Right forearm IV, saline locked. Site clean, dry and intact.  Pain: Pt states no pain at this time Tubes: Left lower arm AV fistula. Positive for bruit and thrill Safety Measures: Bed in lowest position, non-slip socks placed, call light within reach.  Fall Prevention Safety Plan: Reviewed and signed by patient.  Admission Screening: Completed 6700 Orientation: Patient has been oriented to the unit, staff and to the room. Pt lying comfortably in bed with no further needs stated at this time. Orders have been reviewed and implemented. Call light within reach, will continue to monitor.  Shelbie Hutching, RN, BSN

## 2015-06-15 NOTE — Progress Notes (Signed)
EEG Completed; Results Pending  

## 2015-06-15 NOTE — Consult Note (Signed)
[facility] Consult: 11:22 AM 06/15/2015  LOS: 0 days    Referring Provider: Dr Candiss Norse  Primary Care Physician:  [name], MD Primary Gastroenterologist:  Dr. Delfin Edis     Reason for Consultation:  Hematochezia and ABL anemia.    HPI: [name] is a 81 y.o. male.  PMH: ESRD since ~ 09/2014, HD on MWF.  HTN. Degenerative spine dz.  Iron def anemia.  Prostate cancer 2002, treated with radiation.  Hx radiation cystitis 2010.  08/2009 Colonoscopy. Screening study: internal hemorrhoids.  Carries diagnosis of diverticulosis but not noted on the scope.   Yesterday morning the patient strained and passed a light brown, formed stool. There was no blood. He underwent what sounds like unremarkable hemodialysis during the day. Last evening he awoke from a nap in his chair with fecal urgency. As he was walking to the bathroom, he passed out. When EMS arrived and undressed him, it was apparent that he had passed blood from his rectum. Once he was in the emergency room, he had another frankly bloody stool. There was no abdominal pain. No nausea/vomiting.  He hasn't passed any blood since that episode in the ED last night. Hgb avg of 10.5 -11.5.  Hgb 11.5 on 05/06/15. He is receiving epogen type med at [facility], Mircera, every 5 weeks, but no IV or po iron.  11.6 at admit near midnight last night, dropped to 9.7 at 0400 today. MCV 96.  No coags.  MRI of the brain shows moderate atrophy and white matter disease, likely chronic microvascular ischemia. This is stable compared with 2012 MRI. Cervical spine spondylosis. No acute intracranial abnormality.  Takes Aleve daily, 81 ASA.  No PPI or H2 blocker. Patient admits to loss of appetite, his food tastes funny, especially when he wears his new dentures (since 11/2014). He has  lost weight: He remembers weighing 170 pounds about a year ago and current weight is 153#.   No dysphagia. No N/V.  Generally strains to have a bowel movement and occasionally uses some sort of over-the-counter laxity of as well as prune juice, both of which are ineffective. Had not previously seen any blood with bowel movements.    Past Medical History  Diagnosis Date  . ANEMIA-IRON DEFICIENCY 03/09/2008  . DIVERTICULOSIS, COLON 03/09/2008  . GOUT 03/09/2008  . HYPERLIPIDEMIA 03/09/2008  . HYPERTENSION 03/09/2008  . PROSTATE CANCER, HX OF 03/09/2008  . ESRD (end stage renal disease) 03/09/2008  . Complex renal cyst 06/19/2011  . Hyperparathyroidism   . Prostate cancer 2002    Completed external beam radiation 2003.per HPI  . Arthritis     cervical spine.   Marland Kitchen History of blood transfusion   . Depression 03/26/2014  . Hemorrhoids 08/2009    internal.   . Radiation cystitis 2010.    Past Surgical History  Procedure Laterality Date  . Tonsillectomy    . Rotator cuff repair right  4/08  . S/p left hip replacement  2007    Dr. Percell Miller ortho  . Joint replacement  HIP  . Av fistula placement Left 01/24/2013    Procedure: INSERTION OF ARTERIOVENOUS (AV) GORE-TEX GRAFT ARM;  Surgeon: Rosetta Posner, MD;  Location: [facility];  Service: Vascular;  Laterality: Left;    Prior to Admission medications   Medication Sig Start Date End Date Taking? Authorizing Provider  aspirin 81 MG tablet Take 81 mg by mouth daily.    Yes Historical Provider, MD  rosuvastatin (CRESTOR) 20 MG tablet Take 1 tablet (20 mg total) by mouth at bedtime. Patient taking differently: Take 20 mg by mouth every other day.  05/13/14  Yes [name], MD  Specialty Vitamins Products (ONE-A-DAY ENERGY FORMULA PO) Take 1 capsule by mouth daily.   Yes Historical Provider, MD  allopurinol (ZYLOPRIM) 100 MG tablet TAKE 1 TABLET DAILY Patient not taking: Reported on 05/06/2015 10/14/14   [name], MD  atenolol (TENORMIN) 50 MG tablet TAKE  1/2 TABLET DAILY. Patient not taking: Reported on 06/15/2015 04/13/15   [name], MD  citalopram (CELEXA) 10 MG tablet Take 1 tablet (10 mg total) by mouth daily. Patient not taking: Reported on 06/15/2015 05/14/14   [name], MD  doxercalciferol (HECTOROL) 0.5 MCG capsule Take 1.5 mcg by mouth daily.     Historical Provider, MD  tiZANidine (ZANAFLEX) 4 MG tablet Take 1 tablet (4 mg total) by mouth every 6 (six) hours as needed for muscle spasms. Patient not taking: Reported on 06/15/2015 05/06/15   [name], MD    Scheduled Meds: . pantoprazole (PROTONIX) IV  40 mg Intravenous Q12H  . sodium chloride  3 mL Intravenous Q12H   Infusions:   PRN Meds: acetaminophen **OR** acetaminophen, ondansetron **OR** ondansetron (ZOFRAN) IV   Allergies as of 06/14/2015  . (No Known Allergies)    Family History  Problem Relation Age of Onset  . Hypertension Father   . Diabetes Father   . Cancer Father     prostate cancer  . Hypertension Brother     History   Social History  . Marital Status: Married    Spouse Name: N/A  . Number of Children: 2  . Years of Education: N/A   Occupational History  . retired Print production planner    Social History Main Topics  . Smoking status: Former Smoker -- 5 years    Types: Cigarettes    Quit date: 09/17/1982  . Smokeless tobacco: Never Used  . Alcohol Use: No     Comment: rare  . Drug Use: No  . Sexual Activity: Not on file   Other Topics Concern  . Not on file   Social History Narrative   REVIEW OF SYSTEMS: Constitutional:  Per HPI.   ENT:  No nose bleeds Pulm:  No dyspnea, no cough. CV:  No palpitations, no LE edema. No chest pain GU: Just seen by urologist, Dr. Roni Bread, on 06/03/15. His note mentions his prostate had steadily been climbing and was currently 5.46.  Patient makes a small amount of urine, usually passes this in the morning. Has not seen any blood in his urine. GI:  Per HPI Heme:  Per HPI   Transfusions:  In istant  past  Neuro:  No headaches, no peripheral tingling or numbness Derm:  No itching, no rash or sores.  MS: Low back pain limits his ability to walk any distance Endocrine:  No sweats or chills.  No polyuria or dysuria Immunization:  Not queried Travel:  None beyond local counties in last few months.  PHYSICAL EXAM: Vital signs in last 24 hours: Filed Vitals:   06/15/15 0622  BP: 152/73  Pulse: 92  Temp: 98.1 F (36.7 C)  Resp: 20   Wt Readings from Last 3 Encounters:  06/15/15 150 lb (68.04 kg)  05/06/15 153 lb (69.4 kg)  04/13/15 154 lb (69.854 kg)    General: Pleasant, alert, comfortable. Somewhat frail but not ill appearing. Head:  No swelling, no asymmetry. No signs of head trauma.  Eyes:  No scleral icterus, no conjunctival pallor. EOMI. Ears:  No obvious hearing deficit.  Nose:  No congestion, no discharge. Mouth:  Edentulous. Mucous membranes are pink, moist and clear. Tongue is midline. Neck:  No JVD, no TMG. No masses. Lungs:  Clear to auscultation and percussion bilaterally. No cough, no labored breathing. Heart: RRR. Soft 1/6 systolic murmur. Abdomen:  Nonobese, nondistended. Bowel sounds active. Somewhat firm musculature throughout..   Rectal: Maroon blood on exam itch is not malodorous.. No masses, no obvious fissures.   Musc/Skeltl: No joint swelling or contracture deformities. Some arthritic changes in the hands/fingers and knees. Extremities:  No CCE.  Neurologic:  Pleasant. Oriented 3. Moves all 4 limbs, strength not tested. No tremors. No gross neurologic deficits. Skin:  No tears, no rashes. No sores Tattoos:  None seen. Nodes:  No cervical adenopathy.   Psych:  Pleasant, calm, cooperative.  Intake/Output from previous day:   Intake/Output this shift:    LAB RESULTS:  Recent Labs  06/14/15 2356 06/15/15 0414 06/15/15 0810  WBC 5.6 6.3 6.8  HGB 11.6* 9.7* 10.3*  HCT 36.0* 29.7* 31.1*  PLT 166 157 161   BMET Lab Results  Component Value  Date   NA 135 06/15/2015   NA 137 06/14/2015   NA 140 05/06/2015   K 4.6 06/15/2015   K 4.3 06/14/2015   K 4.5 05/06/2015   CL 95* 06/15/2015   CL 94* 06/14/2015   CL 95* 05/06/2015   CO2 29 06/15/2015   CO2 32 06/14/2015   CO2 36* 05/06/2015   GLUCOSE 98 06/15/2015   GLUCOSE 129* 06/14/2015   GLUCOSE 92 05/06/2015   BUN 33* 06/15/2015   BUN 26* 06/14/2015   BUN 24* 05/06/2015   CREATININE 6.65* 06/15/2015   CREATININE 6.43* 06/14/2015   CREATININE 6.89* 05/06/2015   CALCIUM 8.7* 06/15/2015   CALCIUM 8.9 06/14/2015   CALCIUM 9.8 05/06/2015   LFT  Recent Labs  06/15/15 0414  PROT 5.8*  ALBUMIN 3.4*  AST 15  ALT 11*  ALKPHOS 62  BILITOT 0.4   PT/INR No results found for: INR, PROTIME  RADIOLOGY STUDIES: Mr Brain Wo Contrast  06/15/2015   CLINICAL DATA:  Syncopal episode while straining for a bowel movement. Rectal bleeding.  EXAM: MRI HEAD WITHOUT CONTRAST  TECHNIQUE: Multiplanar, multiecho pulse sequences of the brain and surrounding structures were obtained without intravenous contrast.  COMPARISON:  MRI brain 06/26/2011.  FINDINGS: The diffusion-weighted images demonstrate no evidence for acute or subacute infarction. Moderate periventricular and subcortical T2 changes are similar to the prior study. Moderate generalized atrophy is again noted. The ventricles are proportionate to the degree of atrophy.  No significant extra-axial fluid collection is present. Flow is present in the major intracranial arteries. The globes and orbits are intact. Mild mucosal thickening is present in the right frontal sinus and anterior ethmoid air cells. The mastoid air cells are clear. The paranasal sinuses are otherwise clear.  The skullbase is within normal limits. Midline intracranial contents are normal.  Moderate  spondylosis is present in the upper cervical spine grade 1 retrolisthesis is present at C3-4 with progressive endplate degenerative change. There is chronic loss of disc height  at C4-5 and C5-6.  IMPRESSION: 1. No acute intracranial abnormality. 2. Moderate atrophy and white matter disease is similar to the prior study. This likely reflects the sequela of chronic microvascular ischemia. 3. Moderate spondylosis in the upper cervical spine as described.   Electronically Signed   By: San Morelle M.D.   On: 06/15/2015 09:55    ENDOSCOPIC STUDIES: Per HPI  IMPRESSION:   *  Painless hematochezia.  Associated with syncope. Question lower GI bleed versus upper GI bleed. Colonoscopy of 2010 revealed internal hemorrhoids.  *  Syncopal episode in setting of GI bleed. No recurrence. MRI of the head negative for acute findings. EEG in progress.  *  ABL anemia.  History anemia of chronic renal disease, on long acting gentry. On long acting Epogen: Micera  *  History of prostate cancer. Treated with external beam radiation 2003. Radiation cystitis in 2010.  *  ESRD.    PLAN:     *  Per Dr [name]:  Patient currently set up for flex sig at 2 PM today. We will give him one tap water enema before this. Time may need to be adjusted due to patient currently being in EEG and not clear when he will return to his bed.Azucena Freed  06/15/2015, 11:22 AM Pager: [phone]         [facility] Attending  I have also seen and assessed the patient and agree with the advanced practitioner's assessment and plan. I think he is most likely having a diverticular hemorrhage and vagal syncope when bleeding/defecating.   For flex sig today. The risks and benefits as well as alternatives of endoscopic procedure(s) have been discussed and reviewed. All questions answered. The patient agrees to proceed.  The risks and benefits as well as alternatives of endoscopic procedure(s) have been discussed and reviewed. All questions answered. The patient agrees to proceed.  Gatha Mayer, MD, [facility] [phone] (pager) 06/15/2015 2:23 PM

## 2015-06-15 NOTE — Progress Notes (Signed)
Patient Demographics:    [name], is a 81 y.o. male, DOB - Apr 26, 1929, [id number]  Admit date - 06/14/2015   Admitting Physician [name], MD  Outpatient Primary MD for the patient is [name], MD  LOS - 0   Chief Complaint  Patient presents with  . Loss of Consciousness  . Fall  . GI Bleeding        Subjective:    [name] today has, No headache, No chest pain, No abdominal pain - No Nausea, No new weakness tingling or numbness, No Cough - SOB.     Assessment  & Plan :      1. Syncope with fecal incontinence which was bloody. Highly suspicious for seizure activity, detailed history obtained from wife and patient. We'll check MRI brain, EEG and request neurology to evaluate. Any monitoring on telemetry.   2. Bloody bowel movement which was essentially incontinence or syncopal episode. Patient had constipation with hard stool prior to this episode. I'll now continue PPI, GI on board. We'll defer further management of any procedures to GI. Keep nothing by mouth monitor H&H.   3. ESRD, Monday Wednesday Friday schedule renal consulted.   4. Dyslipidemia. Will resume home dose statin once taking by mouth diet.   5. Essential hypertension. Will commence but a blocker was taking oral diet.   6. Mild elevation in troponin. Nonspecific and a ESRD patient, will trend troponins to rule out ACS pattern. EKG is nonacute. Chest pain-free. Echo being obtained.    Code Status : Full  Family Communication  : wife  Disposition Plan  : Home 1-2 days  Consults  :  Neuro, renal  Procedures  :    MRI Brain  EEG  TTE  DVT Prophylaxis  :    SCDs   Lab Results  Component Value Date   PLT 161 06/15/2015    Inpatient Medications  Scheduled Meds: . [START ON  06/16/2015] calcitRIOL  1.5 mcg Oral Q M,W,F-HD  . pantoprazole (PROTONIX) IV  40 mg Intravenous Q12H  . sodium chloride  3 mL Intravenous Q12H   Continuous Infusions:  PRN Meds:.acetaminophen **OR** acetaminophen, ondansetron **OR** ondansetron (ZOFRAN) IV  Antibiotics  :    Anti-infectives    None        Objective:   Filed Vitals:   06/15/15 0600 06/15/15 0604 06/15/15 0622 06/15/15 0826  BP: 138/62  152/73   Pulse: 88  92   Temp:  97.8 F (36.6 C) 98.1 F (36.7 C)   TempSrc:  Oral Oral   Resp: 16  20   Height:    5\' 9"  (1.753 m)  Weight:    68.04 kg (150 lb)  SpO2: 100%  100%     Wt Readings from Last 3 Encounters:  06/15/15 68.04 kg (150 lb)  05/06/15 69.4 kg (153 lb)  04/13/15 69.854 kg (154 lb)    No intake or output data in the 24 hours ending 06/15/15 1138   Physical Exam  Awake Alert, Oriented X 3, No new F.N deficits, Normal affect [place].AT,PERRAL Supple Neck,No JVD, No cervical lymphadenopathy appriciated.  Symmetrical Chest wall movement, Good air movement bilaterally, CTAB RRR,No Gallops,Rubs or new Murmurs, No Parasternal Heave +ve B.Sounds, Abd Soft,  No tenderness, No organomegaly appriciated, No rebound - guarding or rigidity. No Cyanosis, Clubbing or edema, No new Rash or bruise       Data Review:   Micro Results No results found for this or any previous visit (from the past 240 hour(s)).  Radiology Reports Mr Brain Wo Contrast  06/15/2015   CLINICAL DATA:  Syncopal episode while straining for a bowel movement. Rectal bleeding.  EXAM: MRI HEAD WITHOUT CONTRAST  TECHNIQUE: Multiplanar, multiecho pulse sequences of the brain and surrounding structures were obtained without intravenous contrast.  COMPARISON:  MRI brain 06/26/2011.  FINDINGS: The diffusion-weighted images demonstrate no evidence for acute or subacute infarction. Moderate periventricular and subcortical T2 changes are similar to the prior study. Moderate generalized atrophy is again  noted. The ventricles are proportionate to the degree of atrophy.  No significant extra-axial fluid collection is present. Flow is present in the major intracranial arteries. The globes and orbits are intact. Mild mucosal thickening is present in the right frontal sinus and anterior ethmoid air cells. The mastoid air cells are clear. The paranasal sinuses are otherwise clear.  The skullbase is within normal limits. Midline intracranial contents are normal.  Moderate spondylosis is present in the upper cervical spine grade 1 retrolisthesis is present at C3-4 with progressive endplate degenerative change. There is chronic loss of disc height at C4-5 and C5-6.  IMPRESSION: 1. No acute intracranial abnormality. 2. Moderate atrophy and white matter disease is similar to the prior study. This likely reflects the sequela of chronic microvascular ischemia. 3. Moderate spondylosis in the upper cervical spine as described.   Electronically Signed   By: San Morelle M.D.   On: 06/15/2015 09:55     CBC  Recent Labs Lab 06/14/15 2356 06/15/15 0414 06/15/15 0810  WBC 5.6 6.3 6.8  HGB 11.6* 9.7* 10.3*  HCT 36.0* 29.7* 31.1*  PLT 166 157 161  MCV 98.6 96.1 97.5  MCH 31.8 31.4 32.3  MCHC 32.2 32.7 33.1  RDW 15.5 15.6* 15.4    Chemistries   Recent Labs Lab 06/14/15 2356 06/15/15 0414  NA 137 135  K 4.3 4.6  CL 94* 95*  CO2 32 29  GLUCOSE 129* 98  BUN 26* 33*  CREATININE 6.43* 6.65*  CALCIUM 8.9 8.7*  AST  --  15  ALT  --  11*  ALKPHOS  --  62  BILITOT  --  0.4   ------------------------------------------------------------------------------------------------------------------ estimated creatinine clearance is 8.1 mL/min (by C-G formula based on Cr of 6.65). ------------------------------------------------------------------------------------------------------------------ No results for input(s): HGBA1C in the last 72  hours. ------------------------------------------------------------------------------------------------------------------ No results for input(s): CHOL, HDL, LDLCALC, TRIG, CHOLHDL, LDLDIRECT in the last 72 hours. ------------------------------------------------------------------------------------------------------------------ No results for input(s): TSH, T4TOTAL, T3FREE, THYROIDAB in the last 72 hours.  Invalid input(s): FREET3 ------------------------------------------------------------------------------------------------------------------ No results for input(s): VITAMINB12, FOLATE, FERRITIN, TIBC, IRON, RETICCTPCT in the last 72 hours.  Coagulation profile No results for input(s): INR, PROTIME in the last 168 hours.  No results for input(s): DDIMER in the last 72 hours.  Cardiac Enzymes  Recent Labs Lab 06/15/15 1052  TROPONINI 0.05*   ------------------------------------------------------------------------------------------------------------------ Invalid input(s): POCBNP   Time Spent in minutes   35   Jurell Basista K M.D on 06/15/2015 at 11:38 AM  Between 7am to 7pm - Pager - [phone]  After 7pm go to [email or web address] - password [facility]  [facility] -  Office  [phone]

## 2015-06-15 NOTE — Op Note (Signed)
[facility] [place], [id number]   FLEXIBLE SIGMOIDOSCOPY PROCEDURE REPORT  PATIENT: [name], [name]  MR#: [id number] BIRTHDATE: 21-Feb-1929 , 76  yrs. old GENDER: male ENDOSCOPIST: Gatha Mayer, MD, [facility] PROCEDURE DATE:  06/15/2015 PROCEDURE:   Sigmoidoscopy, diagnostic ASA CLASS:   Class IV INDICATIONS:hematochezia. MEDICATIONS: Fentanyl 25 mcg IV and Versed 3 mg IV  DESCRIPTION OF PROCEDURE:   After the risks benefits and alternatives of the procedure were thoroughly explained, informed consent was obtained.  Digital exam revealed no abnormalities of the rectum. The     endoscope was introduced through the anus  and advanced to the descending colon , The exam was Without limitations.    The quality of the prep was The overall prep quality was poor. . Estimated blood loss is zero unless otherwise noted in this procedure report. The instrument was then slowly withdrawn as the mucosa was fully examined.         COLON FINDINGS: Dark red blood and clots throughout - no transition to brown stool.  Diverticulosis left colon.    Retroflexion was not performed.    The scope was then withdrawn from the patient and the procedure terminated.  COMPLICATIONS: There were no immediate complications.  ENDOSCOPIC IMPRESSION: Dark red blood and clots throughout - no transition to brown stool. Diverticulosis left colon Suspect diverticular hemorrhage - could be more proximal vs backwash of blood from left colon. His syncopal episodes sound like vagal events with bleeding/defecation.  RECOMMENDATIONS: Clears, support.  Dialyze tomorrow. If persistent bleeeding will consider colonoscopy with full prep.   eSigned:  Gatha Mayer, MD, [facility] 06/15/2015 2:57 PM

## 2015-06-15 NOTE — Significant Event (Signed)
Rapid Response Event Note  Overview:  Called to assist with patient with decreased BP Time Called: 1302 Arrival Time: 1306 Event Type: Hypotension  Initial Focused Assessment:  On arrival patient in bed in [place] warm and dry alert oriented - denies CP, SOB or abd pain- abd soft  - bil BS = clear - patient states he felt dizzy when OOB - RN Kim present in room states patient was standing and receiving enema = had large bloody return - then had "staring unresponsive" episode - patient was not on monitor at the time - they report placing him back to bed - BP was low [id number] systolic.  They placed him in [place] and he was back to baseline neuro.  CBG 126. Initial BP for me 157/77 HR 84 regular.  RR 18 O2 sats 100% on RA.  Large bloody enema return noted in [facility].     Interventions:  500 cc NS bolus per order Dr. Candiss Norse.  Montiored BP as gradually increased HOB.  Patient tol well. 12 lead EKG done - no changes noted.  SR - placed on telemetry. Tol head of bed to 20 degrees - BP remained stable in the 120/60 range.  Patient continues to deny CP, SOB or abd pain.  No nausea.  NS bolus finished.  Staff to recheck BP every 15 x 2.  To call as needed.  Handoff to [employer].   Event Summary: Name of Physician Notified: Dr. Candiss Norse at 1302    at    Outcome: Stayed in room and stabalized     [place], Hilda Blades L

## 2015-06-15 NOTE — ED Notes (Signed)
Pt states that he usually does not make very much urine.

## 2015-06-15 NOTE — H&P (Addendum)
[facility] History and Physical  [place] Luth Bambach [id number] DOB: 1929-09-25 DOA: 06/14/2015  Referring physician: Dr. Mingo Amber. PCP: [name], MD  Specialists: Nephrologist.  Chief Complaint: Brief episode of loss of consciousness.  HPI: [name] is a 81 y.o. male with history of ESRD on hemodialysis on Monday Wednesday and Friday, history of hypertension presently on no antihypertensives, hyperlipidemia was brought to the ER after patient had a brief episode of loss of consciousness. Patient states he was straining to pass his bowels in the morning yesterday. Late in the evening last night patient was walking in his home when patient suddenly passed out. Following which patient had rectal bleeding. Patient denies hitting his head. Patient had a couple more bowel movements which was frankly bloody. Denies any abdominal pain. Denies any chest pain or shortness of breath or palpitations. In the ER patient was hemodynamically stable and has been admitted for GI bleed. Patient has had a colonoscopy in 2010 which shows internal hemorrhoids. There was also mention of diverticulosis and patient's charts. Patient states he takes Aleve every day for pain.   Review of Systems: As presented in the history of presenting illness, rest negative.  Past Medical History  Diagnosis Date  . ANEMIA-IRON DEFICIENCY 03/09/2008  . DIVERTICULOSIS, COLON 03/09/2008  . FATIGUE 03/09/2008  . GOUT 03/09/2008  . HYPERLIPIDEMIA 03/09/2008  . HYPERTENSION 03/09/2008  . PROSTATE CANCER, HX OF 03/09/2008  . RENAL INSUFFICIENCY 03/09/2008  . SHOULDER PAIN, LEFT 06/03/2010  . Complex renal cyst 06/19/2011  . Hyperparathyroidism   . Cancer     prostate  . Arthritis   . History of blood transfusion   . Depression 03/26/2014   Past Surgical History  Procedure Laterality Date  . Tonsillectomy    . Rotator cuff repair right  4/08  . S/p left hip replacement  2007    Dr. Percell Miller ortho  . Joint replacement      HIP  . Av  fistula placement Left 01/24/2013    Procedure: INSERTION OF ARTERIOVENOUS (AV) GORE-TEX GRAFT ARM;  Surgeon: Rosetta Posner, MD;  Location: [facility];  Service: Vascular;  Laterality: Left;   Social History:  reports that he quit smoking about 32 years ago. His smoking use included Cigarettes. He quit after 5 years of use. He has never used smokeless tobacco. He reports that he does not drink alcohol or use illicit drugs. Where does patient live home. Can patient participate in ADLs? Yes.  No Known Allergies  Family History:  Family History  Problem Relation Age of Onset  . Hypertension Father   . Diabetes Father   . Cancer Father     prostate cancer  . Hypertension Brother       Prior to Admission medications   Medication Sig Start Date End Date Taking? Authorizing Provider  aspirin 81 MG tablet Take 81 mg by mouth daily.    Yes Historical Provider, MD  rosuvastatin (CRESTOR) 20 MG tablet Take 1 tablet (20 mg total) by mouth at bedtime. Patient taking differently: Take 20 mg by mouth every other day.  05/13/14  Yes [name], MD  Specialty Vitamins Products (ONE-A-DAY ENERGY FORMULA PO) Take 1 capsule by mouth daily.   Yes Historical Provider, MD  allopurinol (ZYLOPRIM) 100 MG tablet TAKE 1 TABLET DAILY Patient not taking: Reported on 05/06/2015 10/14/14   [name], MD  atenolol (TENORMIN) 50 MG tablet TAKE 1/2 TABLET DAILY. Patient not taking: Reported on 06/15/2015 04/13/15   Jeneen Rinks  Quin Hoop, MD  citalopram (CELEXA) 10 MG tablet Take 1 tablet (10 mg total) by mouth daily. Patient not taking: Reported on 06/15/2015 05/14/14   [name], MD  doxercalciferol (HECTOROL) 0.5 MCG capsule Take 1.5 mcg by mouth daily.     Historical Provider, MD  tiZANidine (ZANAFLEX) 4 MG tablet Take 1 tablet (4 mg total) by mouth every 6 (six) hours as needed for muscle spasms. Patient not taking: Reported on 06/15/2015 05/06/15   [name], MD    Physical Exam: Filed Vitals:   06/15/15 0145 06/15/15 0200  06/15/15 0215 06/15/15 0230  BP: 145/62 146/62 146/61 143/60  Pulse: 84 79 82 81  Temp:      TempSrc:      Resp: 17 21 22 18   SpO2: 100% 99% 100% 100%     General:  Moderately built and nourished.  Eyes: Anicteric no pallor.  ENT: No discharge from the ears eyes nose and mouth.  Neck: No mass felt.  Cardiovascular: S1 and S2 heard.  Respiratory: No rhonchi or crepitations.  Abdomen: Soft nontender bowel sounds present.  Skin: No rash.  Musculoskeletal: No edema.  Psychiatric: Appears normal.  Neurologic: Alert awake oriented to time place and person. Moves all extremities.  Labs on Admission:  Basic Metabolic Panel:  Recent Labs Lab 06/14/15 2356  NA 137  K 4.3  CL 94*  CO2 32  GLUCOSE 129*  BUN 26*  CREATININE 6.43*  CALCIUM 8.9   Liver Function Tests: No results for input(s): AST, ALT, ALKPHOS, BILITOT, PROT, ALBUMIN in the last 168 hours. No results for input(s): LIPASE, AMYLASE in the last 168 hours. No results for input(s): AMMONIA in the last 168 hours. CBC:  Recent Labs Lab 06/14/15 2356  WBC 5.6  HGB 11.6*  HCT 36.0*  MCV 98.6  PLT 166   Cardiac Enzymes: No results for input(s): CKTOTAL, CKMB, CKMBINDEX, TROPONINI in the last 168 hours.  BNP (last 3 results) No results for input(s): BNP in the last 8760 hours.  ProBNP (last 3 results) No results for input(s): PROBNP in the last 8760 hours.  CBG:  Recent Labs Lab 06/14/15 2343  GLUCAP 110*    Radiological Exams on Admission: No results found.  EKG: Independently reviewed. Normal sinus rhythm with nonspecific ST changes.  Assessment/Plan Principal Problem:   Acute GI bleeding Active Problems:   End stage renal disease   Syncope   Acute blood loss anemia   1. Acute GI bleeding - suspect lower GI bleed given the [facility] rectal bleeding. But since patient also takes Aleve I have placed patient on Protonix. Follow CBC closely. Dr. Deatra Ina of gastroenterology was consulted by  ER physician. 2. ESRD on hemodialysis on Monday Wednesday and Friday - notify nephrology for dialysis. 3. Hyperlipidemia - continue statins when patient can take orally. 4. Acute blood loss anemia secondary to GI bleed - follow CBC. 5. Syncope secondary to #1 - follow in telemetry. 6. History of hypertension presently on no medications.  I have reviewed patient's old charts of labs and personally reviewed patient's EKG.   DVT Prophylaxis SCDs.  Code Status: Full code.  Family Communication: Discussed with patient's wife.  Disposition Plan: Admit to inpatient.    Gabriel Duchene N. [facility] Pager [phone].  If 7PM-7AM, please contact night-coverage [email or web address] Password [email or web address] 06/15/2015, 3:31 AM

## 2015-06-15 NOTE — ED Notes (Signed)
Dr. Mingo Amber notified about pts lactic acid level. No new orders at this time.

## 2015-06-15 NOTE — Procedures (Signed)
EEG report.  Brief clinical history: 81 y/o admitted after sustaining a brief episode of loss of consciousness with fecal incontinence. Concern for seizures.  Technique: this is a 17 channel routine scalp EEG performed at the bedside with bipolar and monopolar montages arranged in accordance to the international 10/20 system of electrode placement. One channel was dedicated to EKG recording.  The study was performed during wakefulness and drowsiness. No activating procedures performed.  Description:In the wakeful state, the best background consisted of a medium amplitude, posterior dominant, well sustained, symmetric and reactive 9 Hz rhythm. Beta activity was noted over the anterior head regions. Drowsiness demonstrated dropout of the alpha rhythm. No focal or generalized epileptiform discharges noted.  No pathologic areas of slowing seen.  EKG showed sinus rhythm.  Impression: this is a normal awake and drowsy EEG. Please, be aware that a normal EEG does not exclude the possibility of epilepsy.  Clinical correlation is advised.   Dorian Pod, MD

## 2015-06-15 NOTE — ED Notes (Signed)
No blood noted in pts brief.

## 2015-06-15 NOTE — Progress Notes (Signed)
Report attempted, EN RN to call this RN back to give report.   Shelbie Hutching, RN, BSN

## 2015-06-15 NOTE — Consult Note (Signed)
Indication for Consultation:  Management of ESRD/hemodialysis; anemia, hypertension/volume and secondary hyperparathyroidism  HPI: [name] is a 81 y.o. male who presented to the ED last night by EMS for passing out in the bathroom and frank blood in stool. He receives HD MWF @ [facility], history of HTN, prostate cancer, diverticulosis and internal hemorrhoids. He reports straining for BM yesterday morning, did not notice any blood in stool at that time. He had a an uneventful HD yesterday with 2.2 L removed, no episodes of hypotension, he did receive heparin. After HD he reports passing out in the bathroom, EMS was called and they noticed frank blood in stool. Denies any previous GIB. He was admitted for workup of GIB and syncopal episode. Will arrange HD while admitted.   Past Medical History  Diagnosis Date  . ANEMIA-IRON DEFICIENCY 03/09/2008  . DIVERTICULOSIS, COLON 03/09/2008  . GOUT 03/09/2008  . HYPERLIPIDEMIA 03/09/2008  . HYPERTENSION 03/09/2008  . PROSTATE CANCER, HX OF 03/09/2008  . ESRD (end stage renal disease) 03/09/2008  . Complex renal cyst 06/19/2011  . Hyperparathyroidism   . Prostate cancer 2002  . Arthritis     cervical spine.   Marland Kitchen History of blood transfusion   . Depression 03/26/2014  . Hemorrhoids 08/2009    internal.    Past Surgical History  Procedure Laterality Date  . Tonsillectomy    . Rotator cuff repair right  4/08  . S/p left hip replacement  2007    Dr. Percell Miller ortho  . Joint replacement      HIP  . Av fistula placement Left 01/24/2013    Procedure: INSERTION OF ARTERIOVENOUS (AV) GORE-TEX GRAFT ARM;  Surgeon: Rosetta Posner, MD;  Location: [facility] OR;  Service: Vascular;  Laterality: Left;   Family History  Problem Relation Age of Onset  . Hypertension Father   . Diabetes Father   . Cancer Father     prostate cancer  . Hypertension Brother    Social History:  reports that he quit smoking about 32 years ago. His smoking use included Cigarettes. He quit after 5  years of use. He has never used smokeless tobacco. He reports that he does not drink alcohol or use illicit drugs. No Known Allergies Prior to Admission medications   Medication Sig Start Date End Date Taking? Authorizing Provider  aspirin 81 MG tablet Take 81 mg by mouth daily.    Yes Historical Provider, MD  rosuvastatin (CRESTOR) 20 MG tablet Take 1 tablet (20 mg total) by mouth at bedtime. Patient taking differently: Take 20 mg by mouth every other day.  05/13/14  Yes [name], MD  Specialty Vitamins Products (ONE-A-DAY ENERGY FORMULA PO) Take 1 capsule by mouth daily.   Yes Historical Provider, MD  allopurinol (ZYLOPRIM) 100 MG tablet TAKE 1 TABLET DAILY Patient not taking: Reported on 05/06/2015 10/14/14   [name], MD  atenolol (TENORMIN) 50 MG tablet TAKE 1/2 TABLET DAILY. Patient not taking: Reported on 06/15/2015 04/13/15   [name], MD  citalopram (CELEXA) 10 MG tablet Take 1 tablet (10 mg total) by mouth daily. Patient not taking: Reported on 06/15/2015 05/14/14   [name], MD  doxercalciferol (HECTOROL) 0.5 MCG capsule Take 1.5 mcg by mouth daily.     Historical Provider, MD  tiZANidine (ZANAFLEX) 4 MG tablet Take 1 tablet (4 mg total) by mouth every 6 (six) hours as needed for muscle spasms. Patient not taking: Reported on 06/15/2015 05/06/15   [name],  MD   Current Facility-Administered Medications  Medication Dose Route Frequency Provider Last Rate Last Dose  . acetaminophen (TYLENOL) tablet 650 mg  650 mg Oral Q6H PRN [name], MD       Or  . acetaminophen (TYLENOL) suppository 650 mg  650 mg Rectal Q6H PRN [name], MD      . ondansetron [facility]) tablet 4 mg  4 mg Oral Q6H PRN [name], MD       Or  . ondansetron [facility] injection 4 mg  4 mg Intravenous Q6H PRN [name], MD      . pantoprazole (PROTONIX) injection 40 mg  40 mg Intravenous Q12H [name], MD   40 mg at 06/15/15 0957  . sodium chloride 0.9 %  injection 3 mL  3 mL Intravenous Q12H [name], MD   3 mL at 06/15/15 0958   Labs: Basic Metabolic Panel:  Recent Labs Lab 06/14/15 2356 06/15/15 0414  NA 137 135  K 4.3 4.6  CL 94* 95*  CO2 32 29  GLUCOSE 129* 98  BUN 26* 33*  CREATININE 6.43* 6.65*  CALCIUM 8.9 8.7*   Liver Function Tests:  Recent Labs Lab 06/15/15 0414  AST 15  ALT 11*  ALKPHOS 62  BILITOT 0.4  PROT 5.8*  ALBUMIN 3.4*   No results for input(s): LIPASE, AMYLASE in the last 168 hours. No results for input(s): AMMONIA in the last 168 hours. CBC:  Recent Labs Lab 06/14/15 2356 06/15/15 0414 06/15/15 0810  WBC 5.6 6.3 6.8  HGB 11.6* 9.7* 10.3*  HCT 36.0* 29.7* 31.1*  MCV 98.6 96.1 97.5  PLT 166 157 161   Cardiac Enzymes: No results for input(s): CKTOTAL, CKMB, CKMBINDEX, TROPONINI in the last 168 hours. CBG:  Recent Labs Lab 06/14/15 2343 06/15/15 0811  GLUCAP 110* 90   Iron Studies: No results for input(s): IRON, TIBC, TRANSFERRIN, FERRITIN in the last 72 hours. Studies/Results: Mr Brain Wo Contrast  06/15/2015   CLINICAL DATA:  Syncopal episode while straining for a bowel movement. Rectal bleeding.  EXAM: MRI HEAD WITHOUT CONTRAST  TECHNIQUE: Multiplanar, multiecho pulse sequences of the brain and surrounding structures were obtained without intravenous contrast.  COMPARISON:  MRI brain 06/26/2011.  FINDINGS: The diffusion-weighted images demonstrate no evidence for acute or subacute infarction. Moderate periventricular and subcortical T2 changes are similar to the prior study. Moderate generalized atrophy is again noted. The ventricles are proportionate to the degree of atrophy.  No significant extra-axial fluid collection is present. Flow is present in the major intracranial arteries. The globes and orbits are intact. Mild mucosal thickening is present in the right frontal sinus and anterior ethmoid air cells. The mastoid air cells are clear. The paranasal sinuses are otherwise  clear.  The skullbase is within normal limits. Midline intracranial contents are normal.  Moderate spondylosis is present in the upper cervical spine grade 1 retrolisthesis is present at C3-4 with progressive endplate degenerative change. There is chronic loss of disc height at C4-5 and C5-6.  IMPRESSION: 1. No acute intracranial abnormality. 2. Moderate atrophy and white matter disease is similar to the prior study. This likely reflects the sequela of chronic microvascular ischemia. 3. Moderate spondylosis in the upper cervical spine as described.   Electronically Signed   By: San Morelle M.D.   On: 06/15/2015 09:55    Review of Systems: Denies fever/chills Reports occasional constipation- prune juice PRN. Denies n/v Reports decreased appetite, food doesn't taste as  good as it used to. Denies previous blood in stool  Reports losing wt.  Denies lightheaded and dizzy Denies chest pain/sob   Physical Exam: Filed Vitals:   06/15/15 0600 06/15/15 0604 06/15/15 0622 06/15/15 0826  BP: 138/62  152/73   Pulse: 88  92   Temp:  97.8 F (36.6 C) 98.1 F (36.7 C)   TempSrc:  Oral Oral   Resp: 16  20   Height:    5\' 9"  (1.753 m)  Weight:    68.04 kg (150 lb)  SpO2: 100%  100%      General: Well developed, well nourished, in no acute distress. Head: Normocephalic, atraumatic, sclera non-icteric, mucus membranes are moist Neck: Supple. JVD not elevated. Lungs: Clear bilaterally to auscultation without wheezes, rales, or rhonchi. Breathing is unlabored. Heart: RRR with S1 S2. No murmurs, rubs, or gallops appreciated. Abdomen: Soft, non-tender, non-distended with normoactive bowel sounds. No rebound/guarding. No obvious abdominal masses. M-S:  Strength and tone appear normal for age. Lower extremities:without edema or ischemic changes, no open wounds  Neuro: Alert and oriented X 3. Moves all extremities spontaneously. Psych:  Responds to questions appropriately with a normal  affect. Dialysis Access: L AVG +b/t  Dialysis Orders: MWF [facility] 3hrs 30 mins   400/800   68.5kgs   2K/2CA   5400u heparin   L AVG micera 50 q 5 weeks- last given on 7/27 Calcitriol 1.5 q hd  Assessment/Plan: 1.  GIB- GI following. History of internal hemorrhoids on colonoscopy. Maroon blood on exam by GI/ Workup in progress. protonix started. 2. Syncope- MRI- no acute abnormality. ECHO and EEG pending 3.  [facility] -  [facility]. Hold heparin. K+ 4.6 next HD tomorrow 4.  Hypertension/volume  - received 525ml bolus in the ED. controlled. No BP meds. No hypotension during HD yesterday. 2.2 L removed.  5.  Anemia  - hgb 10.3- outpt hgb around 11. Micera q 5 weeks, last given 7/27. No Fe, tsat 41 6.  Metabolic bone disease -  phos 2.7 and PTH 213- no binder. Cont calcitriol 7.  Nutrition - NPO. Alb 3.4 decreased appetite- wt loss- EDW 72 kgs in nov, now 68.5kgs  Shelle Iron, NP [facility] [phone] 06/15/2015, 11:08 AM   Pt seen, examined and agree w A/P as above.  Kelly Splinter MD pager [phone]    cell [phone] 06/15/2015, 5:35 PM

## 2015-06-16 ENCOUNTER — Inpatient Hospital Stay (HOSPITAL_COMMUNITY): Payer: Medicare Other

## 2015-06-16 DIAGNOSIS — R55 Syncope and collapse: Secondary | ICD-10-CM

## 2015-06-16 DIAGNOSIS — I509 Heart failure, unspecified: Secondary | ICD-10-CM

## 2015-06-16 LAB — CBC WITH DIFFERENTIAL/PLATELET
BASOS ABS: 0 10*3/uL (ref 0.0–0.1)
Basophils Relative: 1 % (ref 0–1)
EOS PCT: 3 % (ref 0–5)
Eosinophils Absolute: 0.1 10*3/uL (ref 0.0–0.7)
HCT: 24.6 % — ABNORMAL LOW (ref 39.0–52.0)
Hemoglobin: 8.2 g/dL — ABNORMAL LOW (ref 13.0–17.0)
Lymphocytes Relative: 24 % (ref 12–46)
Lymphs Abs: 1.3 10*3/uL (ref 0.7–4.0)
MCH: 31.5 pg (ref 26.0–34.0)
MCHC: 33.3 g/dL (ref 30.0–36.0)
MCV: 94.6 fL (ref 78.0–100.0)
Monocytes Absolute: 0.3 10*3/uL (ref 0.1–1.0)
Monocytes Relative: 6 % (ref 3–12)
NEUTROS ABS: 3.5 10*3/uL (ref 1.7–7.7)
Neutrophils Relative %: 66 % (ref 43–77)
PLATELETS: 156 10*3/uL (ref 150–400)
RBC: 2.6 MIL/uL — ABNORMAL LOW (ref 4.22–5.81)
RDW: 15.5 % (ref 11.5–15.5)
WBC: 5.3 10*3/uL (ref 4.0–10.5)

## 2015-06-16 LAB — GLUCOSE, CAPILLARY
Glucose-Capillary: 85 mg/dL (ref 65–99)
Glucose-Capillary: 88 mg/dL (ref 65–99)
Glucose-Capillary: 106 mg/dL — ABNORMAL HIGH (ref 65–99)
Glucose-Capillary: 130 mg/dL — ABNORMAL HIGH (ref 65–99)

## 2015-06-16 LAB — RENAL FUNCTION PANEL
ANION GAP: 12 (ref 5–15)
Albumin: 3.3 g/dL — ABNORMAL LOW (ref 3.5–5.0)
BUN: 54 mg/dL — ABNORMAL HIGH (ref 6–20)
CHLORIDE: 99 mmol/L — AB (ref 101–111)
CO2: 25 mmol/L (ref 22–32)
Calcium: 9.1 mg/dL (ref 8.9–10.3)
Creatinine, Ser: 8.72 mg/dL — ABNORMAL HIGH (ref 0.61–1.24)
GFR, EST AFRICAN AMERICAN: 6 mL/min — AB (ref >60)
GFR, EST NON AFRICAN AMERICAN: 5 mL/min — AB (ref >60)
GLUCOSE: 92 mg/dL (ref 65–99)
POTASSIUM: 4.7 mmol/L (ref 3.5–5.1)
Phosphorus: 3.1 mg/dL (ref 2.5–4.6)
Sodium: 136 mmol/L (ref 135–145)

## 2015-06-16 LAB — PREPARE RBC (CROSSMATCH)

## 2015-06-16 LAB — HEMOGLOBIN AND HEMATOCRIT, BLOOD
HCT: 24.8 % — ABNORMAL LOW (ref 39.0–52.0)
HEMOGLOBIN: 8.3 g/dL — AB (ref 13.0–17.0)

## 2015-06-16 LAB — TROPONIN I: TROPONIN I: 0.05 ng/mL — AB (ref <0.031)

## 2015-06-16 MED ORDER — PEG 3350-KCL-NA BICARB-NACL 420 G PO SOLR
2000.0000 mL | Freq: Once | ORAL | Status: AC
  Administered 2015-06-16: 2000 mL via ORAL
  Filled 2015-06-16 (×2): qty 4000

## 2015-06-16 MED ORDER — PEG 3350-KCL-NA BICARB-NACL 420 G PO SOLR
2000.0000 mL | Freq: Once | ORAL | Status: AC
  Administered 2015-06-17: 2000 mL via ORAL
  Filled 2015-06-16: qty 4000

## 2015-06-16 MED ORDER — TECHNETIUM TC 99M-LABELED RED BLOOD CELLS IV KIT
24.4000 | PACK | Freq: Once | INTRAVENOUS | Status: AC | PRN
  Administered 2015-06-16: 24 via INTRAVENOUS

## 2015-06-16 MED ORDER — SODIUM CHLORIDE 0.9 % IV BOLUS (SEPSIS): 500.0000 mL | Freq: Once | INTRAVENOUS | Status: DC

## 2015-06-16 MED ORDER — CITALOPRAM HYDROBROMIDE 20 MG PO TABS
10.0000 mg | ORAL_TABLET | Freq: Every day | ORAL | Status: DC
  Administered 2015-06-16 – 2015-06-20 (×5): 10 mg via ORAL
  Filled 2015-06-16 (×7): qty 1

## 2015-06-16 MED ORDER — DARBEPOETIN ALFA 100 MCG/0.5ML IJ SOSY
100.0000 ug | PREFILLED_SYRINGE | INTRAMUSCULAR | Status: DC
Start: 2015-06-16 — End: 2015-06-17
  Filled 2015-06-16: qty 0.5

## 2015-06-16 MED ORDER — ALLOPURINOL 100 MG PO TABS
100.0000 mg | ORAL_TABLET | Freq: Every day | ORAL | Status: DC
  Administered 2015-06-16 – 2015-06-20 (×5): 100 mg via ORAL
  Filled 2015-06-16 (×6): qty 1

## 2015-06-16 MED ORDER — ROSUVASTATIN CALCIUM 20 MG PO TABS
20.0000 mg | ORAL_TABLET | ORAL | Status: DC
  Administered 2015-06-16 – 2015-06-20 (×3): 20 mg via ORAL
  Filled 2015-06-16 (×4): qty 1

## 2015-06-16 MED ORDER — CALCITRIOL 0.5 MCG PO CAPS
ORAL_CAPSULE | ORAL | Status: AC
  Filled 2015-06-16: qty 3

## 2015-06-16 MED ORDER — SODIUM CHLORIDE 0.9 % IV SOLN: Freq: Once | INTRAVENOUS | Status: DC

## 2015-06-16 MED ORDER — METOCLOPRAMIDE HCL 5 MG/ML IJ SOLN
10.0000 mg | Freq: Once | INTRAMUSCULAR | Status: AC
  Administered 2015-06-17: 10 mg via INTRAVENOUS
  Filled 2015-06-16: qty 2

## 2015-06-16 MED ORDER — METOCLOPRAMIDE HCL 5 MG/ML IJ SOLN
10.0000 mg | Freq: Once | INTRAMUSCULAR | Status: AC
  Administered 2015-06-16: 10 mg via INTRAVENOUS
  Filled 2015-06-16: qty 2

## 2015-06-16 MED ORDER — DIPHENHYDRAMINE HCL 50 MG/ML IJ SOLN: 25.0000 mg | Freq: Four times a day (QID) | INTRAMUSCULAR | Status: DC | PRN

## 2015-06-16 NOTE — Progress Notes (Signed)
Called into the patient room,found patient was sitting up on the chair,pale looking,not responsive for a minute or two,skin is warm and dry and patient is breathing,bilateral carotid arteries are pulsating,on telemetry ST at 110/min.Noticed a moderate amount of dark red blood pooling on the floor under the chair.Blood pressure while sitting up at this time was 100/45.After a minute,patient become verbally responsive,then vomited a moderate amount of cherry colored red vomitus.Patient not in distress at this time.Rapid response nurse at the bedside at this time.Grabbed Dr. Grandville Silos who is on the floor,and paged the primary doctor including G.I.Orders done and carried out.No orders to transfer.Will monitor.

## 2015-06-16 NOTE — Significant Event (Signed)
Rapid Response Event Note  Overview:  Called to assist with patient with syncopal episode Time Called: 1504 Arrival Time: 1510 Event Type: Hypotension  Initial Focused Assessment:  On arrival patient sitting on edge of bed - awake but lethargic - skin warm and moist - some clear bile emesis on arrival - large amount of dark clotted blood on floor - staff report patient had incontinence stool while attempting to ambulate to BR.  Patient unaware of incontinence of stool.  Staff report they were walking him to BR - he c/o dizziness and proceeded to pass out - was lowered to a chair by staff.  Had pulse and was breathing during episode - just unresponsive to voice.  Lasted about 2 minutes.  On arousal patient was placed in bed. Dr. Grandville Silos was at the bedside - order for NS bolus.     Interventions:  Patient lowered to lying position in bed - placed on side with nausea - BP 100/43 HR 106 RR 18 O2 sats 99% on RA - CBG 106.  Patient becoming more alert - states he got dizzy and passed out - unaware of incontinence - denies abd pain SOB or CP. NT Dianna reports patient passed out at same time dark bloody BM happened.  Right antecubital PIV leaking- staff attempted to give NS bolus - site checked -leaking = d/c cath intact.  New #20 angio started right arm mid-forearm times one attempt.  Stat H&H sent to lab.  Patient feeling better - awake and alert - warm and dry.  BP now 143/77 HR 106 R 22.  Dr. Candiss Norse to bedside - states to given only 250 cc NS bolus and to discontinue NS when PRBC's arrive.  Wife reports patient had episode of bloody stool in HD today.  BP 142/63 HR 104.  Oral care given - patient to remain NPO for now.  Handoff to [employer] who is starting blood transfusion.  Wife supported questions answered and updated.  Staff to call as needed.  Will follow.    Event Summary: Name of Physician Notified: Dr. Candiss Norse at  (PTA RRT)  Name of Consulting Physician Notified: Dr. Grandville Silos ( on unit at time of  event - helped in room) at    Outcome: Stayed in room and stabalized  Event End Time: 1615  Quin Hoop

## 2015-06-16 NOTE — Progress Notes (Signed)
Utilization review complete. Lorena Clearman RN CCM Case Mgmt phone [phone] 

## 2015-06-16 NOTE — Progress Notes (Signed)
Patient had an episode of feeling lightheaded and  "sleepy" during treatment b/p 93/52 placed in trend and 256ml bolus given patient remained alert and responsive also noted to have a moderate to large amount of bright red blood coming from rectum. Solon Palm, NP notified no new orders and reported to floor nurse

## 2015-06-16 NOTE — Consult Note (Signed)
NEURO HOSPITALIST CONSULT NOTE   Referring physician: Candiss Norse   Reason for Consult: syncope  HPI:                                                                                                                                          [name] is an 81 y.o. male presenting to hospital after syncopal episode at home.  Patient states on Monday night he stood up to take a shower and on his way to the shower he felt the urge to have a BM. At this time he felt dizzy while walking and then passed out.  He awoke to to his wife and EMS around him.  He had no post ictal confusion.  He was noted to have BRBPR. HE was brought to hospital where he continued to have rectal bleeding.  GI has seen him and obtained a Flex Sig. Showing diverticulosis of left rectum with suspected diverticular hemorrhage.  While in hospital he received a enema.  during this time he had a staring spell  And when sat down it was noted his BP was only in the 80's. HE returned to baseline after he was laid down and BP returned to normal  He has had no further episodes.   Past Medical History  Diagnosis Date  . ANEMIA-IRON DEFICIENCY 03/09/2008  . DIVERTICULOSIS, COLON 03/09/2008  . GOUT 03/09/2008  . HYPERLIPIDEMIA 03/09/2008  . HYPERTENSION 03/09/2008  . PROSTATE CANCER, HX OF 03/09/2008  . ESRD (end stage renal disease) 03/09/2008  . Complex renal cyst 06/19/2011  . Hyperparathyroidism   . Prostate cancer 2002    Completed external beam radiation 2003.per HPI  . Arthritis     cervical spine.   Marland Kitchen History of blood transfusion   . Depression 03/26/2014  . Hemorrhoids 08/2009    internal.   . Radiation cystitis 2010.    Past Surgical History  Procedure Laterality Date  . Tonsillectomy    . Rotator cuff repair right  4/08  . S/p left hip replacement  2007    Dr. Percell Miller ortho  . Joint replacement      HIP  . Av fistula placement Left 01/24/2013    Procedure: INSERTION OF ARTERIOVENOUS (AV) GORE-TEX GRAFT ARM;   Surgeon: Rosetta Posner, MD;  Location: [facility] OR;  Service: Vascular;  Laterality: Left;    Family History  Problem Relation Age of Onset  . Hypertension Father   . Diabetes Father   . Cancer Father     prostate cancer  . Hypertension Brother      Social History:  reports that he quit smoking about 32 years ago. His smoking use included Cigarettes. He quit after 5 years of use. He has never used smokeless tobacco. He reports that he does  not drink alcohol or use illicit drugs.  No Known Allergies  MEDICATIONS:                                                                                                                     Prior to Admission:  Prescriptions prior to admission  Medication Sig Dispense Refill Last Dose  . aspirin 81 MG tablet Take 81 mg by mouth daily.    06/14/2015 at Unknown time  . rosuvastatin (CRESTOR) 20 MG tablet Take 1 tablet (20 mg total) by mouth at bedtime. (Patient taking differently: Take 20 mg by mouth every other day. ) 90 tablet 3 Past Week at Unknown time  . Specialty Vitamins Products (ONE-A-DAY ENERGY FORMULA PO) Take 1 capsule by mouth daily.   Past Week at Unknown time  . allopurinol (ZYLOPRIM) 100 MG tablet TAKE 1 TABLET DAILY (Patient not taking: Reported on 05/06/2015) 90 tablet 3 Not Taking  . atenolol (TENORMIN) 50 MG tablet TAKE 1/2 TABLET DAILY. (Patient not taking: Reported on 06/15/2015) 45 tablet 3 Taking  . citalopram (CELEXA) 10 MG tablet Take 1 tablet (10 mg total) by mouth daily. (Patient not taking: Reported on 06/15/2015) 90 tablet 3 Taking  . doxercalciferol (HECTOROL) 0.5 MCG capsule Take 1.5 mcg by mouth daily.    Taking  . tiZANidine (ZANAFLEX) 4 MG tablet Take 1 tablet (4 mg total) by mouth every 6 (six) hours as needed for muscle spasms. (Patient not taking: Reported on 06/15/2015) 30 tablet 0    Scheduled: . calcitRIOL  1.5 mcg Oral Q M,W,F-HD  . pantoprazole (PROTONIX) IV  40 mg Intravenous Q12H  . sodium chloride  3 mL Intravenous Q12H      ROS:                                                                                                                                       History obtained from the patient  General ROS: negative for - chills, fatigue, fever, night sweats, weight gain or weight loss Psychological ROS: negative for - behavioral disorder, hallucinations, memory difficulties, mood swings or suicidal ideation Ophthalmic ROS: negative for - blurry vision, double vision, eye pain or loss of vision ENT ROS: negative for - epistaxis, nasal discharge, oral lesions, sore throat, tinnitus or vertigo Allergy and Immunology ROS: negative for - hives or itchy/watery eyes Hematological and Lymphatic ROS: negative for - bleeding problems, bruising or  swollen lymph nodes Endocrine ROS: negative for - galactorrhea, hair pattern changes, polydipsia/polyuria or temperature intolerance Respiratory ROS: negative for - cough, hemoptysis, shortness of breath or wheezing Cardiovascular ROS: negative for - chest pain, dyspnea on exertion, edema or irregular heartbeat Gastrointestinal ROS: negative for - abdominal pain, diarrhea, hematemesis, nausea/vomiting or stool incontinence Genito-Urinary ROS: negative for - dysuria, hematuria, incontinence or urinary frequency/urgency Musculoskeletal ROS: negative for - joint swelling or muscular weakness Neurological ROS: as noted in HPI Dermatological ROS: negative for rash and skin lesion changes   Blood pressure 103/45, pulse 88, temperature 98.6 F (37 C), temperature source Oral, resp. rate 18, height 5\' 9"  (1.753 m), weight 71.3 kg (157 lb 3 oz), SpO2 99 %.   Neurologic Examination:                                                                                                      HEENT-  Normocephalic, no lesions, without obvious abnormality.  Normal external eye and conjunctiva.  Normal TM's bilaterally.  Normal auditory canals and external ears. Normal external nose, mucus  membranes and septum.  Normal pharynx. Cardiovascular- S1, S2 normal, pulses palpable throughout   Lungs- chest clear, no wheezing, rales, normal symmetric air entry, Heart exam - S1, S2 normal, no murmur, no gallop, rate regular Abdomen- normal findings: bowel sounds normal Extremities- no edema Lymph-no adenopathy palpable Musculoskeletal-no joint tenderness, deformity or swelling Skin-warm and dry, no hyperpigmentation, vitiligo, or suspicious lesions  Neurological Examination Mental Status: Alert, oriented, thought content appropriate.  Speech fluent without evidence of aphasia.  Able to follow 3 step commands without difficulty. Cranial Nerves: II: Discs flat bilaterally; Visual fields grossly normal, pupils equal, round, reactive to light and accommodation III,IV, VI: ptosis not present, extra-ocular motions intact bilaterally V,VII: smile symmetric, facial light touch sensation normal bilaterally VIII: hearing normal bilaterally IX,X: uvula rises symmetrically XI: bilateral shoulder shrug XII: midline tongue extension Motor: Right : Upper extremity   5/5    Left:     Upper extremity   5/5  Lower extremity   5/5     Lower extremity   5/5 Tone and bulk:normal tone throughout; no atrophy noted Sensory: Pinprick and light touch intact throughout, bilaterally Deep Tendon Reflexes: 2+ and symmetric throughout Plantars: Right: downgoing   Left: downgoing Cerebellar: normal finger-to-nose, normal rapid alternating movements and normal heel-to-shin test Gait: normal gait and station      Lab Results: Basic Metabolic Panel:  Recent Labs Lab 06/14/15 2356 06/15/15 0414 06/16/15 0830  NA 137 135 136  K 4.3 4.6 4.7  CL 94* 95* 99*  CO2 32 29 25  GLUCOSE 129* 98 92  BUN 26* 33* 54*  CREATININE 6.43* 6.65* 8.72*  CALCIUM 8.9 8.7* 9.1  PHOS  --   --  3.1    Liver Function Tests:  Recent Labs Lab 06/15/15 0414 06/16/15 0830  AST 15  --   ALT 11*  --   ALKPHOS 62   --   BILITOT 0.4  --   PROT 5.8*  --   ALBUMIN 3.4*  3.3*   No results for input(s): LIPASE, AMYLASE in the last 168 hours. No results for input(s): AMMONIA in the last 168 hours.  CBC:  Recent Labs Lab 06/14/15 2356 06/15/15 0414 06/15/15 0810 06/15/15 1930 06/16/15 0830  WBC 5.6 6.3 6.8  --  5.3  NEUTROABS  --   --   --   --  3.5  HGB 11.6* 9.7* 10.3* 9.0* 8.2*  HCT 36.0* 29.7* 31.1* 27.4* 24.6*  MCV 98.6 96.1 97.5  --  94.6  PLT 166 157 161  --  156    Cardiac Enzymes:  Recent Labs Lab 06/15/15 1052 06/15/15 1930 06/16/15 0045  TROPONINI 0.05* 0.05* 0.05*    Lipid Panel: No results for input(s): CHOL, TRIG, HDL, CHOLHDL, VLDL, LDLCALC in the last 168 hours.  CBG:  Recent Labs Lab 06/15/15 1245 06/15/15 1311 06/15/15 2120 06/16/15 0350 06/16/15 0730  GLUCAP 101* 129* 104* 85 88    Microbiology: Results for orders placed or performed during the hospital encounter of 01/24/13  Surgical pcr screen     Status: None   Collection Time: 01/24/13 10:23 AM  Result Value Ref Range Status   MRSA, PCR NEGATIVE NEGATIVE Final   Staphylococcus aureus NEGATIVE NEGATIVE Final    Comment:        The Xpert SA Assay (FDA approved for NASAL specimens in patients over 1 years of age), is one component of a comprehensive surveillance program.  Test performance has been validated by [employer] for patients greater than or equal to 79 year old. It is not intended to diagnose infection nor to guide or monitor treatment.    Coagulation Studies: No results for input(s): LABPROT, INR in the last 72 hours.  Imaging: Mr Brain Wo Contrast  06/15/2015   CLINICAL DATA:  Syncopal episode while straining for a bowel movement. Rectal bleeding.  EXAM: MRI HEAD WITHOUT CONTRAST  TECHNIQUE: Multiplanar, multiecho pulse sequences of the brain and surrounding structures were obtained without intravenous contrast.  COMPARISON:  MRI brain 06/26/2011.  FINDINGS: The  diffusion-weighted images demonstrate no evidence for acute or subacute infarction. Moderate periventricular and subcortical T2 changes are similar to the prior study. Moderate generalized atrophy is again noted. The ventricles are proportionate to the degree of atrophy.  No significant extra-axial fluid collection is present. Flow is present in the major intracranial arteries. The globes and orbits are intact. Mild mucosal thickening is present in the right frontal sinus and anterior ethmoid air cells. The mastoid air cells are clear. The paranasal sinuses are otherwise clear.  The skullbase is within normal limits. Midline intracranial contents are normal.  Moderate spondylosis is present in the upper cervical spine grade 1 retrolisthesis is present at C3-4 with progressive endplate degenerative change. There is chronic loss of disc height at C4-5 and C5-6.  IMPRESSION: 1. No acute intracranial abnormality. 2. Moderate atrophy and white matter disease is similar to the prior study. This likely reflects the sequela of chronic microvascular ischemia. 3. Moderate spondylosis in the upper cervical spine as described.   Electronically Signed   By: San Morelle M.D.   On: 06/15/2015 09:55       Assessment and plan per attending neurologist  Etta Quill PA-C [facility] [phone]  06/16/2015, 9:55 AM   Assessment/Plan:  81 YO admitted following an episode of loss of consciousness at home, the description of which is indicative of probable syncopal spell. Exam is non-focal. MRI and EEG unrevealing. Patient has no clinical indications of seizure  disorder. No further neurodiagnostic studies are indicated, nor further clinical neurological intervention.   We will sign off on his care at this point, but remain available to see him in follow-up if clinically indicated.  I personally participated in this patient's evaluation and management, including formulating the above clinical  impression and management recommendations.  Rush Farmer M.D. [facility] [phone]

## 2015-06-16 NOTE — Progress Notes (Signed)
   Patient Name: [name] Date of Encounter: 06/16/2015, 10:35 AM    Subjective  Less bleeding - if any On HD now and feels ok   Objective  BP 113/59 mmHg  Pulse 90  Temp(Src) 98.1 F (36.7 C) (Oral)  Resp 18  Ht 5\' 9"  (1.753 m)  Wt 153 lb 10.6 oz (69.7 kg)  BMI 22.68 kg/m2  SpO2 100% NAD  CBC Latest Ref Rng 06/16/2015 06/15/2015 06/15/2015  WBC 4.0 - 10.5 K/uL 5.3 - 6.8  Hemoglobin 13.0 - 17.0 g/dL 8.2(L) 9.0(L) 10.3(L)  Hematocrit 39.0 - 52.0 % 24.6(L) 27.4(L) 31.1(L)  Platelets 150 - 400 K/uL 156 - 161        Assessment and Plan  Lower GI bleed thought diverticular - not certain. Given that last colonoscopy was 2010 will plan to repeat one with full prep - tomorrow at about 4 PM  The risks and benefits as well as alternatives of endoscopic procedure(s) have been discussed and reviewed. All questions answered. The patient agrees to proceed.   Gatha Mayer, MD, [facility] [phone] (pager) 06/16/2015 10:35 AM

## 2015-06-16 NOTE — Progress Notes (Signed)
  Echocardiogram 2D Echocardiogram has been performed.  [name] 06/16/2015, 2:16 PM

## 2015-06-16 NOTE — Progress Notes (Addendum)
Patient Demographics:    [name], is a 81 y.o. male, DOB - 1929-08-31, [id number]  Admit date - 06/14/2015   Admitting Physician [name], MD  Outpatient Primary MD for the patient is [name], MD  LOS - 1   Chief Complaint  Patient presents with  . Loss of Consciousness  . Fall  . GI Bleeding        Subjective:    [name] today has, No headache, No chest pain, No abdominal pain - No Nausea, No new weakness tingling or numbness, No Cough - SOB.     Assessment  & Plan :     1. Syncope with fecal incontinence which was bloody. Was suspicious for seizure activity, however EEG unremarkable and later showed evidence of orthostasis, stable MRI brain and EEG, hydrated, will increase activity and monitor orthostatic blood pressures, apply TED stockings. Continue monitoring on telemetry. Monitor H&H.   Question if he became orthostatic on the day of initial presentation as a result of excessive fluid removal during his dialysis run which happened that morning.   2. Bloody bowel movement which was essentially incontinence or syncopal episode. Seen by GI and underwent flexible sigmoidoscopy on 06/15/2015 showing evidence of diverticular bleed, monitor H&H no need for transfusion yet.   Addendum - another LGI bleed with syncope 3pm - D/W Dr [name] and IR - will get Tagged RBC scan - Follow bleeding scan, if +ve call IR on call via amion.   3. ESRD, Monday Wednesday Friday schedule renal on board.   4. Dyslipidemia. Home dose statin resumed.   5. Essential hypertension. Blood pressure stable hold off blood pressure medications and monitor orthostatics.   6. Mild elevation in troponin. Nonspecific and a ESRD patient, troponin flat lined at 0.05, non-ACS pattern. EKG is  nonacute. Chest pain-free. Echo being obtained.    Code Status : Full  Family Communication  : wife  Disposition Plan  : Home 1-2 days  Consults  :  Neuro, renal  Procedures  :    MRI Brain - no acute changes  EEG - no seizure-like activity noted  Flexible sigmoidoscopy by Dr. Silvano Rusk on 06/15/2015. Showing diverticulosis and some blood in the colon  TTE  DVT Prophylaxis  :    SCDs   Lab Results  Component Value Date   PLT 156 06/16/2015    Inpatient Medications  Scheduled Meds: . calcitRIOL  1.5 mcg Oral Q M,W,F-HD  . pantoprazole (PROTONIX) IV  40 mg Intravenous Q12H  . sodium chloride  3 mL Intravenous Q12H   Continuous Infusions: . sodium chloride     PRN Meds:.acetaminophen **OR** acetaminophen, ondansetron **OR** ondansetron (ZOFRAN) IV  Antibiotics  :    Anti-infectives    None        Objective:   Filed Vitals:   06/15/15 1647 06/15/15 2154 06/16/15 0455 06/16/15 0534  BP: 122/62 143/65 103/45   Pulse: 86 93 88   Temp: 97.4 F (36.3 C) 98.5 F (36.9 C) 98.6 F (37 C)   TempSrc: Oral Oral Oral   Resp: 18 16 18    Height:      Weight:    71.3 kg (157 lb 3 oz)  SpO2: 100% 98% 99%  Wt Readings from Last 3 Encounters:  06/16/15 71.3 kg (157 lb 3 oz)  05/06/15 69.4 kg (153 lb)  04/13/15 69.854 kg (154 lb)     Intake/Output Summary (Last 24 hours) at 06/16/15 1002 Last data filed at 06/15/15 1855  Gross per 24 hour  Intake    480 ml  Output      0 ml  Net    480 ml     Physical Exam  Awake Alert, Oriented X 3, No new F.N deficits, Normal affect [place].AT,PERRAL Supple Neck,No JVD, No cervical lymphadenopathy appriciated.  Symmetrical Chest wall movement, Good air movement bilaterally, CTAB RRR,No Gallops,Rubs or new Murmurs, No Parasternal Heave +ve B.Sounds, Abd Soft, No tenderness, No organomegaly appriciated, No rebound - guarding or rigidity. No Cyanosis, Clubbing or edema, No new Rash or bruise       Data Review:    Micro Results No results found for this or any previous visit (from the past 240 hour(s)).  Radiology Reports Mr Brain Wo Contrast  06/15/2015   CLINICAL DATA:  Syncopal episode while straining for a bowel movement. Rectal bleeding.  EXAM: MRI HEAD WITHOUT CONTRAST  TECHNIQUE: Multiplanar, multiecho pulse sequences of the brain and surrounding structures were obtained without intravenous contrast.  COMPARISON:  MRI brain 06/26/2011.  FINDINGS: The diffusion-weighted images demonstrate no evidence for acute or subacute infarction. Moderate periventricular and subcortical T2 changes are similar to the prior study. Moderate generalized atrophy is again noted. The ventricles are proportionate to the degree of atrophy.  No significant extra-axial fluid collection is present. Flow is present in the major intracranial arteries. The globes and orbits are intact. Mild mucosal thickening is present in the right frontal sinus and anterior ethmoid air cells. The mastoid air cells are clear. The paranasal sinuses are otherwise clear.  The skullbase is within normal limits. Midline intracranial contents are normal.  Moderate spondylosis is present in the upper cervical spine grade 1 retrolisthesis is present at C3-4 with progressive endplate degenerative change. There is chronic loss of disc height at C4-5 and C5-6.  IMPRESSION: 1. No acute intracranial abnormality. 2. Moderate atrophy and white matter disease is similar to the prior study. This likely reflects the sequela of chronic microvascular ischemia. 3. Moderate spondylosis in the upper cervical spine as described.   Electronically Signed   By: San Morelle M.D.   On: 06/15/2015 09:55     CBC  Recent Labs Lab 06/14/15 2356 06/15/15 0414 06/15/15 0810 06/15/15 1930 06/16/15 0830  WBC 5.6 6.3 6.8  --  5.3  HGB 11.6* 9.7* 10.3* 9.0* 8.2*  HCT 36.0* 29.7* 31.1* 27.4* 24.6*  PLT 166 157 161  --  156  MCV 98.6 96.1 97.5  --  94.6  MCH 31.8 31.4  32.3  --  31.5  MCHC 32.2 32.7 33.1  --  33.3  RDW 15.5 15.6* 15.4  --  15.5  LYMPHSABS  --   --   --   --  1.3  MONOABS  --   --   --   --  0.3  EOSABS  --   --   --   --  0.1  BASOSABS  --   --   --   --  0.0    Chemistries   Recent Labs Lab 06/14/15 2356 06/15/15 0414 06/16/15 0830  NA 137 135 136  K 4.3 4.6 4.7  CL 94* 95* 99*  CO2 32 29 25  GLUCOSE 129* 98 92  BUN  26* 33* 54*  CREATININE 6.43* 6.65* 8.72*  CALCIUM 8.9 8.7* 9.1  AST  --  15  --   ALT  --  11*  --   ALKPHOS  --  62  --   BILITOT  --  0.4  --    ------------------------------------------------------------------------------------------------------------------ estimated creatinine clearance is 6.4 mL/min (by C-G formula based on Cr of 8.72). ------------------------------------------------------------------------------------------------------------------ No results for input(s): HGBA1C in the last 72 hours. ------------------------------------------------------------------------------------------------------------------ No results for input(s): CHOL, HDL, LDLCALC, TRIG, CHOLHDL, LDLDIRECT in the last 72 hours. ------------------------------------------------------------------------------------------------------------------ No results for input(s): TSH, T4TOTAL, T3FREE, THYROIDAB in the last 72 hours.  Invalid input(s): FREET3 ------------------------------------------------------------------------------------------------------------------ No results for input(s): VITAMINB12, FOLATE, FERRITIN, TIBC, IRON, RETICCTPCT in the last 72 hours.  Coagulation profile No results for input(s): INR, PROTIME in the last 168 hours.  No results for input(s): DDIMER in the last 72 hours.  Cardiac Enzymes  Recent Labs Lab 06/15/15 1052 06/15/15 1930 06/16/15 0045  TROPONINI 0.05* 0.05* 0.05*    ------------------------------------------------------------------------------------------------------------------ Invalid input(s): POCBNP   Time Spent in minutes   35   Coye Dawood K M.D on 06/16/2015 at 10:02 AM  Between 7am to 7pm - Pager - [phone]  After 7pm go to [email or web address] - password [facility]  [facility] -  Office  [phone]

## 2015-06-16 NOTE — Progress Notes (Signed)
  [facility] Progress Note   Subjective: no further bloody stools overnight, flex sig showed blood in colon and diverticulosis, GI suspecting tic bleed. No active bleeder. Hb down to 8.2 today  Filed Vitals:   06/16/15 0831 06/16/15 0900 06/16/15 0930 06/16/15 1000  BP: 127/60 140/69 125/56 113/59  Pulse: 88 94 89 90  Temp:      TempSrc:      Resp:      Height:      Weight:      SpO2:       Exam: Alert, no distress, calm No jvd Chest clear bilat RRR no MRG ABd soft ntnd no ascites or mass No LE or UE edema L arm AVF +bruit Neuro is nf, ox 3  MWF [facility]     3hrs 30 mins 400/800 68.5kgs 2K/2CA 5400u heparin L AVG Mircera 50 q 5 weeks- last given on 7/27 Calcitriol 1.5 q hd      Assessment: 1. GI bleed - s/p flex sig showing blood in colon and diverticulosis, suspected tic bleed. Hb 12 >> 8.4 today, asymptomatic at this point. Transfuse prn.  2. Syncope- MRI- no acute abnormality. ECHO and EEG pending 3. ESRD - M[facility]. Hold heparin. K+ 4.6 next HD tomorrow 4. Hypertension/volume - received 52ml bolus in the ED. controlled. No BP meds. No hypotension during HD yesterday. 2.2 L removed.  5. Anemia - hgb 10.3- outpt hgb around 11. Micera q 5 weeks, last given 7/27. No Fe, tsat 41.  Will start darbe 100/ wk today .  6. Metabolic bone disease - phos 2.7 and PTH 213- no binder. Cont calcitriol 7. Nutrition - NPO. Alb 3.4 decreased appetite- wt loss- EDW 72 kgs in nov, now 68.5kgs  Plan - HD today, darbe 100 ug weekly for now, serial H/H, transfuse prn    Kelly Splinter MD  pager [phone]    cell [phone]  06/16/2015, 10:26 AM     Recent Labs Lab 06/14/15 2356 06/15/15 0414 06/16/15 0830  NA 137 135 136  K 4.3 4.6 4.7  CL 94* 95* 99*  CO2 32 29 25  GLUCOSE 129* 98 92  BUN 26* 33* 54*  CREATININE 6.43* 6.65* 8.72*  CALCIUM 8.9 8.7* 9.1  PHOS  --   --  3.1    Recent Labs Lab 06/15/15 0414 06/16/15 0830  AST 15  --   ALT 11*  --    ALKPHOS 62  --   BILITOT 0.4  --   PROT 5.8*  --   ALBUMIN 3.4* 3.3*    Recent Labs Lab 06/15/15 0414 06/15/15 0810 06/15/15 1930 06/16/15 0830  WBC 6.3 6.8  --  5.3  NEUTROABS  --   --   --  3.5  HGB 9.7* 10.3* 9.0* 8.2*  HCT 29.7* 31.1* 27.4* 24.6*  MCV 96.1 97.5  --  94.6  PLT 157 161  --  156   . allopurinol  100 mg Oral Daily  . calcitRIOL  1.5 mcg Oral Q M,W,F-HD  . citalopram  10 mg Oral Daily  . pantoprazole (PROTONIX) IV  40 mg Intravenous Q12H  . rosuvastatin  20 mg Oral QODAY  . sodium chloride  3 mL Intravenous Q12H   . sodium chloride     acetaminophen **OR** acetaminophen, ondansetron **OR** ondansetron (ZOFRAN) IV

## 2015-06-16 NOTE — Progress Notes (Addendum)
Pt requested be assisted to bathroom. Fainted at the sink assisted by CNA to sit onto chair at 3pm. Passed  Large amount of blood and became unresponsive for about 2 minutes, then become alert and responding. States "just feel dizzy" when this Probation officer asked what happened. RRT called into room to assist. Dr. Grandville Silos on the unit asked for assistance. Called Dr. Candiss Norse, the attending MD. Dr. [name] also called and made aware of the event.

## 2015-06-17 ENCOUNTER — Encounter (HOSPITAL_COMMUNITY)
Admission: EM | Disposition: A | Discharge status: Home Health | Payer: Self-pay | Source: Home / Self Care | Attending: Internal Medicine

## 2015-06-17 ENCOUNTER — Ambulatory Visit (HOSPITAL_COMMUNITY): Payer: Medicare Other

## 2015-06-17 ENCOUNTER — Encounter (HOSPITAL_COMMUNITY): Payer: Self-pay | Admitting: Internal Medicine

## 2015-06-17 DIAGNOSIS — K2970 Gastritis, unspecified, without bleeding: Secondary | ICD-10-CM | POA: Insufficient documentation

## 2015-06-17 DIAGNOSIS — D124 Benign neoplasm of descending colon: Secondary | ICD-10-CM

## 2015-06-17 HISTORY — PX: ESOPHAGOGASTRODUODENOSCOPY: SHX5428

## 2015-06-17 HISTORY — PX: COLONOSCOPY: SHX5424

## 2015-06-17 LAB — GLUCOSE, CAPILLARY
GLUCOSE-CAPILLARY: 104 mg/dL — AB (ref 65–99)
Glucose-Capillary: 123 mg/dL — ABNORMAL HIGH (ref 65–99)
Glucose-Capillary: 63 mg/dL — ABNORMAL LOW (ref 65–99)
Glucose-Capillary: 73 mg/dL (ref 65–99)
Glucose-Capillary: 89 mg/dL (ref 65–99)
Glucose-Capillary: 91 mg/dL (ref 65–99)

## 2015-06-17 LAB — HEMOGLOBIN AND HEMATOCRIT, BLOOD
HCT: 26.5 % — ABNORMAL LOW (ref 39.0–52.0)
HCT: 27.1 % — ABNORMAL LOW (ref 39.0–52.0)
HEMOGLOBIN: 8.9 g/dL — AB (ref 13.0–17.0)
Hemoglobin: 9.1 g/dL — ABNORMAL LOW (ref 13.0–17.0)

## 2015-06-17 LAB — HEPATITIS B SURFACE ANTIGEN: Hepatitis B Surface Ag: NEGATIVE

## 2015-06-17 SURGERY — EGD (ESOPHAGOGASTRODUODENOSCOPY)
Anesthesia: Moderate Sedation

## 2015-06-17 SURGERY — COLONOSCOPY
Anesthesia: Moderate Sedation

## 2015-06-17 MED ORDER — MIDAZOLAM HCL 5 MG/ML IJ SOLN
INTRAMUSCULAR | Status: AC
  Filled 2015-06-17: qty 2

## 2015-06-17 MED ORDER — FENTANYL CITRATE (PF) 100 MCG/2ML IJ SOLN
INTRAMUSCULAR | Status: DC | PRN
  Administered 2015-06-17 (×2): 12.5 ug via INTRAVENOUS

## 2015-06-17 MED ORDER — DIPHENHYDRAMINE HCL 50 MG/ML IJ SOLN
INTRAMUSCULAR | Status: AC
  Filled 2015-06-17: qty 1

## 2015-06-17 MED ORDER — FENTANYL CITRATE (PF) 100 MCG/2ML IJ SOLN
INTRAMUSCULAR | Status: AC
  Filled 2015-06-17: qty 2

## 2015-06-17 MED ORDER — DARBEPOETIN ALFA 100 MCG/0.5ML IJ SOSY
100.0000 ug | PREFILLED_SYRINGE | INTRAMUSCULAR | Status: DC
Start: 2015-06-18 — End: 2015-06-22
  Filled 2015-06-17: qty 0.5

## 2015-06-17 MED ORDER — MIDAZOLAM HCL 5 MG/5ML IJ SOLN
INTRAMUSCULAR | Status: DC | PRN
  Administered 2015-06-17 (×4): 1 mg via INTRAVENOUS

## 2015-06-17 NOTE — Progress Notes (Signed)
   Patient Name: [name] Date of Encounter: 06/17/2015, 8:30 AM    Subjective  Had almost half of prep last night and is due for half more now Says "A little blood"   Objective  BP 115/60 mmHg  Pulse 89  Temp(Src) 98.7 F (37.1 C) (Oral)  Resp 18  Ht 5\' 9"  (1.753 m)  Wt 154 lb 1.6 oz (69.9 kg)  BMI 22.75 kg/m2  SpO2 100%  Tagged RBC - stomach activity but no extravasation   Assessment and Plan  GI bleed - suspect lower Activity in stomach on RBC scan   Encouraged and explained he needed to drink the colon prep - says he will D/W RN Delana Meyer May need an enema also  I have added an EGD on as possible procedure if colonoscopy unhelpful and w/ gastric activity on bleeding scan though pattern of bleeding fits lower GI bleed I think  Gatha Mayer, MD, [facility] [phone] (pager) 06/17/2015 8:30 AM

## 2015-06-17 NOTE — Progress Notes (Signed)
Subjective:   Feels good sitting, says some lightheaded/dizzy when he stands up. RN reports passed large blood clot- drinking prep for colonoscopy  Objective Filed Vitals:   06/16/15 1615 06/16/15 1643 06/16/15 2133 06/17/15 0607  BP: 131/52 114/60 135/47 115/60  Pulse: 99 90 101 89  Temp: 97.8 F (36.6 C) 98.4 F (36.9 C) 98.1 F (36.7 C) 98.7 F (37.1 C)  TempSrc: Oral Oral    Resp: 17 18 18 18   Height:      Weight:      SpO2: 100% 100% 100% 100%   Physical Exam General: alert and oriented, sitting in chair. No acute distress Heart: RRR Lungs: CTA, unlabored Abdomen: soft, nontender +BS  Extremities: no edema  Dialysis Access: L AVf +b/t  MWF EAST 3hrs 30 mins 400/800 68.5kgs 2K/2CA 5400u heparin L AVG Mircera 50 q 5 weeks- last given on 7/27 Calcitriol 1.5 q hd  Assessment/Plan: 1. GIB- GI following. Sigmoidoscopy- dark red blood and clots throughout. For colonoscopy today. 2. Syncope- MRI- no acute abnormality. ECHO- symptoms consistent with hypovolumia and EEG negative 3. ESRD - MWF [place]. Hold heparin.  next HD tomorrow 4. Hypertension/volume - controlled. No BP meds.   5. Anemia - hgb 9.1-s/p 1 u RBC 8/10 outpt hgb around 11. Micera q 5 weeks, last given 7/27- started ESA. No Fe, tsat 41 6. Metabolic bone disease - phos 3.1and PTH 213- no binder. Cont calcitriol 7. Nutrition - NPO. Alb 3.8 decreased appetite- wt loss- EDW 72 kgs in nov, now 68.5kgs  Shelle Iron, NP [place] [phone] 06/17/2015,10:10 AM  LOS: 2 days   Pt seen, examined and agree w A/P as above.  Kelly Splinter MD pager [phone]    cell [phone] 06/17/2015, 3:23 PM   Additional Objective Labs: Basic Metabolic Panel:  Recent Labs Lab 06/14/15 2356 06/15/15 0414 06/16/15 0830  NA 137 135 136  K 4.3 4.6 4.7  CL 94* 95* 99*  CO2 32 29 25  GLUCOSE 129* 98 92  BUN 26* 33* 54*  CREATININE 6.43* 6.65* 8.72*  CALCIUM 8.9 8.7* 9.1  PHOS  --    --  3.1   Liver Function Tests:  Recent Labs Lab 06/15/15 0414 06/16/15 0830  AST 15  --   ALT 11*  --   ALKPHOS 62  --   BILITOT 0.4  --   PROT 5.8*  --   ALBUMIN 3.4* 3.3*   No results for input(s): LIPASE, AMYLASE in the last 168 hours. CBC:  Recent Labs Lab 06/14/15 2356 06/15/15 0414 06/15/15 0810  06/16/15 0830 06/16/15 1513 06/17/15 0145 06/17/15 0908  WBC 5.6 6.3 6.8  --  5.3  --   --   --   NEUTROABS  --   --   --   --  3.5  --   --   --   HGB 11.6* 9.7* 10.3*  < > 8.2* 8.3* 8.9* 9.1*  HCT 36.0* 29.7* 31.1*  < > 24.6* 24.8* 26.5* 27.1*  MCV 98.6 96.1 97.5  --  94.6  --   --   --   PLT 166 157 161  --  156  --   --   --   < > = values in this interval not displayed. Blood Culture No results found for: SDES, SPECREQUEST, CULT, REPTSTATUS  Cardiac Enzymes:  Recent Labs Lab 06/15/15 1052 06/15/15 1930 06/16/15 0045  TROPONINI 0.05* 0.05* 0.05*   CBG:  Recent Labs Lab 06/16/15 1529 06/16/15  2129 06/17/15 0013 06/17/15 0605 06/17/15 0731  GLUCAP 106* 130* 123* 89 104*   Iron Studies: No results for input(s): IRON, TIBC, TRANSFERRIN, FERRITIN in the last 72 hours. @lablastinr3 @ Studies/Results: Nm Gi Blood Loss  06/16/2015   CLINICAL DATA:  Gastrointestinal bleeding earlier today  EXAM: NUCLEAR MEDICINE GASTROINTESTINAL BLEEDING SCAN  TECHNIQUE: Sequential abdominal and pelvic images were obtained following intravenous administration of Tc-49m labeled red blood cells for a 2 hr time span.  RADIOPHARMACEUTICALS:  24.4 mCi Tc-61m in-vitro labeled red cells.  COMPARISON:  None.  FINDINGS: There is gastric uptake of radiotracer which is not progressed during this study. There is no abnormal radiotracer uptake in the small or large bowel regions.  IMPRESSION: Increased radiotracer uptake in the stomach raises concern for gastritis. No small bowel or large bowel acute gastrointestinal bleeding focus is identified on this study.   Electronically Signed   By:  Lowella Grip III M.D.   On: 06/16/2015 21:09   Medications:   . sodium chloride   Intravenous Once  . allopurinol  100 mg Oral Daily  . calcitRIOL  1.5 mcg Oral Q M,W,F-HD  . citalopram  10 mg Oral Daily  . darbepoetin (ARANESP) injection - DIALYSIS  100 mcg Intravenous Q Wed-HD  . pantoprazole (PROTONIX) IV  40 mg Intravenous Q12H  . rosuvastatin  20 mg Oral QODAY  . sodium chloride  500 mL Intravenous Once  . sodium chloride  500 mL Intravenous Once  . sodium chloride  3 mL Intravenous Q12H

## 2015-06-17 NOTE — Progress Notes (Signed)
Pt drank less than half of the prep for his colonoscopy stating that he could not drank anymore. Pt was explained the importance of the prep and stated he knows he has had one before. Pt's stools are watery with dark red blood. Will report to AM nurse to make the GI MD aware. Will f/u.

## 2015-06-17 NOTE — Evaluation (Signed)
Physical Therapy Evaluation Patient Details Name: [name] MRN: [id number] DOB: [birth year] Today's Date: 06/17/2015   History of Present Illness  81 yo male admitted with GI bleed, syncopal episodes. Hx or ESRD, HD-MWF, HTN, prostate cancer.   Clinical Impression  On eval, pt was Min guard -Min assist for mobility-walked ~30 feet in room. Unsteady. Pt denied dizziness during session. BP sitting: 152/63, standing: 130/54. Educated pt on changing positions slowly and waiting before immediately standing and/or ambulating to see how he feels. Pt requested to return to bed. Colonoscopy pending for this pm. Encouraged him to try walking a little with nursing in again later today.     Follow Up Recommendations Home health PT;Supervision/Assistance - 24 hour    Equipment Recommendations  None recommended by PT    Recommendations for Other Services       Precautions / Restrictions Precautions Precautions: Fall Precaution Comments: syncopal episodes  Restrictions Weight Bearing Restrictions: No      Mobility  Bed Mobility Overal bed mobility: Modified Independent                Transfers Overall transfer level: Needs assistance   Transfers: Sit to/from Stand Sit to Stand: Min guard         General transfer comment: close guard. Unsteady with initial standing.  Ambulation/Gait Ambulation/Gait assistance: Min guard;Min assist Ambulation Distance (Feet): 30 Feet Assistive device: None (pt reaching out for objects to steady himself) Gait Pattern/deviations: Decreased stride length;Step-through pattern     General Gait Details: slow, unsteady gait pattern. Pt denied dizziness. close guard for safety  Stairs            Wheelchair Mobility    Modified Rankin (Stroke Patients Only)       Balance                                             Pertinent Vitals/Pain Pain Assessment: No/denies pain    Home Living Family/patient expects  to be discharged to:: Private residence Living Arrangements: Spouse/significant other   Type of Home: House Home Access: Stairs to enter Entrance Stairs-Rails: None Technical brewer of Steps: 1 Home Layout: One level Home Equipment: Cane - single point      Prior Function Level of Independence: Independent               Hand Dominance        Extremity/Trunk Assessment   Upper Extremity Assessment: Generalized weakness           Lower Extremity Assessment: Generalized weakness      Cervical / Trunk Assessment: Kyphotic  Communication   Communication: No difficulties  Cognition Arousal/Alertness: Awake/alert Behavior During Therapy: WFL for tasks assessed/performed Overall Cognitive Status: Within Functional Limits for tasks assessed                      General Comments      Exercises General Exercises - Lower Extremity Heel Raises: AROM;5 reps;Standing      Assessment/Plan    PT Assessment Patient needs continued PT services  PT Diagnosis Difficulty walking;Generalized weakness   PT Problem List Decreased strength;Decreased activity tolerance;Decreased balance;Decreased mobility  PT Treatment Interventions DME instruction;Gait training;Functional mobility training;Balance training;Therapeutic activities;Therapeutic exercise;Patient/family education   PT Goals (Current goals can be found in the Care Plan section) Acute Rehab PT Goals Patient Stated Goal:  to regain PLOF PT Goal Formulation: With patient/family Time For Goal Achievement: 07/01/15 Potential to Achieve Goals: Good    Frequency Min 3X/week   Barriers to discharge        Co-evaluation               End of Session Equipment Utilized During Treatment: Gait belt Activity Tolerance: Patient tolerated treatment well Patient left: in bed;with call bell/phone within reach;with family/visitor present           Time: [id number] PT Time Calculation (min) (ACUTE  ONLY): 9 min   Charges:   PT Evaluation $Initial PT Evaluation Tier I: 1 Procedure     PT G Codes:        Weston Anna, MPT Pager: [phone]

## 2015-06-17 NOTE — Progress Notes (Signed)
Patient returned to unit from endo procedure. Denies pain. No distress noted. Meal tray ordered.  Joellen Jersey, RN.

## 2015-06-17 NOTE — Progress Notes (Signed)
Patient Demographics:    Cheyene Stude, is a 81 y.o. male, DOB - 1929-11-01, [id number]  Admit date - 06/14/2015   Admitting Physician [name], MD  Outpatient Primary MD for the patient is [name], MD  LOS - 2   Chief Complaint  Patient presents with  . Loss of Consciousness  . Fall  . GI Bleeding        Subjective:    Jeneen Rinks [place] today has, No headache, No chest pain, No abdominal pain - No Nausea, No new weakness tingling or numbness, No Cough - SOB.     Assessment  & Plan :     1. Syncope with fecal incontinence which was bloody. Was suspicious for seizure activity, however EEG unremarkable and later showed evidence of orthostasis, stable MRI brain and, TTE & EEG, was orthostatic, has been hydrated, will increase activity and monitor orthostatic blood pressures, apply TED stockings. Continue monitoring on telemetry. Monitor H&H.   Question if he became orthostatic on the day of initial presentation as a result of excessive fluid removal during his dialysis run along with LGI bleed.    2. LGI Bleed x 3 . Seen by GI and underwent flexible sigmoidoscopy on 06/15/2015 showing evidence of possible previous diverticular bleed, received 1 unit of packed RBC transfusion on 06/16/2015 after a large bright red blood per rectum, hemodynamically stable, tagged RBC scan obtained on 06/16/2015 unremarkable. GI on board and due for colonoscopy on 06/17/2015. Continue to monitor H&H.   3. ESRD, Monday Wednesday Friday schedule renal on board.   4. Dyslipidemia. Home dose statin resumed.   5. Essential hypertension. Blood pressure stable hold off blood pressure medications and monitor orthostatics.   6. Mild elevation in troponin. Nonspecific and a ESRD patient, troponin flat lined at  0.05, non-ACS pattern. EKG is nonacute. Chest pain-free. Echocardiogram stable discussed with reading physician Dr. Sallyanne Kuster. Echo essentially consistent with hypovolemia.   Code Status : Full  Family Communication  : wife  Disposition Plan  : Home 1-2 days  Consults  :  Neuro, renal  Procedures  :    MRI Brain - no acute changes  EEG - no seizure-like activity noted  Flexible sigmoidoscopy by Dr. Silvano Rusk on 06/15/2015. Showing diverticulosis and some blood in the colon.  Tagged RBC scan 06/16/2015. No active bleeding.   TTE  - Left ventricle: The cavity size was normal. Systolic function washyperdynamic. The estimated ejection fraction was in the range of70% to 75%. There was dynamic obstruction in the mid cavity, witha peak velocity of 200 cm/sec and a peak gradient of 16 mm Hg. Wall motion was normal; there were no regional wall motionabnormalities. There was an increased relative contribution ofatrial contraction to ventricular filling, which may be due tohypovolemia. - Aortic valve: Valve area (VTI): 2.73 cm^2. Valve area (Vmax): 2.05 cm^2. Valve area (Vmean): 2.14 cm^2. - Right ventricle: Systolic function was hyperdynamic. - Pulmonary arteries: PA peak pressure: 33 mm Hg (S). - Systemic veins: The inferior vena cava is collapsed.  Impressions:  Findings are consistent with hypovolemia    DVT Prophylaxis  :    SCDs   Lab Results  Component Value Date   PLT 156 06/16/2015    Inpatient Medications  Scheduled Meds: . sodium chloride   Intravenous Once  . allopurinol  100 mg Oral Daily  . calcitRIOL  1.5 mcg Oral Q M,W,F-HD  . citalopram  10 mg Oral Daily  . darbepoetin (ARANESP) injection - DIALYSIS  100 mcg Intravenous Q Wed-HD  . pantoprazole (PROTONIX) IV  40 mg Intravenous Q12H  . rosuvastatin  20 mg Oral QODAY  . sodium chloride  500 mL Intravenous Once  . sodium chloride  500 mL Intravenous Once  . sodium chloride  3 mL Intravenous Q12H    Continuous Infusions:   PRN Meds:.acetaminophen **OR** acetaminophen, diphenhydrAMINE, ondansetron **OR** ondansetron (ZOFRAN) IV  Antibiotics  :    Anti-infectives    None        Objective:   Filed Vitals:   06/16/15 1615 06/16/15 1643 06/16/15 2133 06/17/15 0607  BP: 131/52 114/60 135/47 115/60  Pulse: 99 90 101 89  Temp: 97.8 F (36.6 C) 98.4 F (36.9 C) 98.1 F (36.7 C) 98.7 F (37.1 C)  TempSrc: Oral Oral    Resp: 17 18 18 18   Height:      Weight:      SpO2: 100% 100% 100% 100%    Wt Readings from Last 3 Encounters:  06/16/15 69.9 kg (154 lb 1.6 oz)  05/06/15 69.4 kg (153 lb)  04/13/15 69.854 kg (154 lb)     Intake/Output Summary (Last 24 hours) at 06/17/15 1033 Last data filed at 06/17/15 0200  Gross per 24 hour  Intake   1295 ml  Output   1478 ml  Net   -183 ml     Physical Exam  Awake Alert, Oriented X 3, No new F.N deficits, Normal affect [place].AT,PERRAL Supple Neck,No JVD, No cervical lymphadenopathy appriciated.  Symmetrical Chest wall movement, Good air movement bilaterally, CTAB RRR,No Gallops,Rubs or new Murmurs, No Parasternal Heave +ve B.Sounds, Abd Soft, No tenderness, No organomegaly appriciated, No rebound - guarding or rigidity. No Cyanosis, Clubbing or edema, No new Rash or bruise       Data Review:   Micro Results No results found for this or any previous visit (from the past 240 hour(s)).  Radiology Reports Mr Brain Wo Contrast  06/15/2015   CLINICAL DATA:  Syncopal episode while straining for a bowel movement. Rectal bleeding.  EXAM: MRI HEAD WITHOUT CONTRAST  TECHNIQUE: Multiplanar, multiecho pulse sequences of the brain and surrounding structures were obtained without intravenous contrast.  COMPARISON:  MRI brain 06/26/2011.  FINDINGS: The diffusion-weighted images demonstrate no evidence for acute or subacute infarction. Moderate periventricular and subcortical T2 changes are similar to the prior study. Moderate generalized  atrophy is again noted. The ventricles are proportionate to the degree of atrophy.  No significant extra-axial fluid collection is present. Flow is present in the major intracranial arteries. The globes and orbits are intact. Mild mucosal thickening is present in the right frontal sinus and anterior ethmoid air cells. The mastoid air cells are clear. The paranasal sinuses are otherwise clear.  The skullbase is within normal limits. Midline intracranial contents are normal.  Moderate spondylosis is present in the upper cervical spine grade 1 retrolisthesis is present at C3-4 with progressive endplate degenerative change. There is chronic loss of disc height at C4-5 and C5-6.  IMPRESSION: 1. No acute intracranial abnormality. 2. Moderate atrophy and white matter disease is similar to the prior study. This likely reflects the sequela of chronic microvascular ischemia. 3. Moderate spondylosis in the upper cervical spine as described.   Electronically Signed  By: San Morelle M.D.   On: 06/15/2015 09:55   Nm Gi Blood Loss  06/16/2015   CLINICAL DATA:  Gastrointestinal bleeding earlier today  EXAM: NUCLEAR MEDICINE GASTROINTESTINAL BLEEDING SCAN  TECHNIQUE: Sequential abdominal and pelvic images were obtained following intravenous administration of Tc-58m labeled red blood cells for a 2 hr time span.  RADIOPHARMACEUTICALS:  24.4 mCi Tc-53m in-vitro labeled red cells.  COMPARISON:  None.  FINDINGS: There is gastric uptake of radiotracer which is not progressed during this study. There is no abnormal radiotracer uptake in the small or large bowel regions.  IMPRESSION: Increased radiotracer uptake in the stomach raises concern for gastritis. No small bowel or large bowel acute gastrointestinal bleeding focus is identified on this study.   Electronically Signed   By: Lowella Grip III M.D.   On: 06/16/2015 21:09     CBC  Recent Labs Lab 06/14/15 2356 06/15/15 0414 06/15/15 0810 06/15/15 1930  06/16/15 0830 06/16/15 1513 06/17/15 0145 06/17/15 0908  WBC 5.6 6.3 6.8  --  5.3  --   --   --   HGB 11.6* 9.7* 10.3* 9.0* 8.2* 8.3* 8.9* 9.1*  HCT 36.0* 29.7* 31.1* 27.4* 24.6* 24.8* 26.5* 27.1*  PLT 166 157 161  --  156  --   --   --   MCV 98.6 96.1 97.5  --  94.6  --   --   --   MCH 31.8 31.4 32.3  --  31.5  --   --   --   MCHC 32.2 32.7 33.1  --  33.3  --   --   --   RDW 15.5 15.6* 15.4  --  15.5  --   --   --   LYMPHSABS  --   --   --   --  1.3  --   --   --   MONOABS  --   --   --   --  0.3  --   --   --   EOSABS  --   --   --   --  0.1  --   --   --   BASOSABS  --   --   --   --  0.0  --   --   --     Chemistries   Recent Labs Lab 06/14/15 2356 06/15/15 0414 06/16/15 0830  NA 137 135 136  K 4.3 4.6 4.7  CL 94* 95* 99*  CO2 32 29 25  GLUCOSE 129* 98 92  BUN 26* 33* 54*  CREATININE 6.43* 6.65* 8.72*  CALCIUM 8.9 8.7* 9.1  AST  --  15  --   ALT  --  11*  --   ALKPHOS  --  62  --   BILITOT  --  0.4  --    ------------------------------------------------------------------------------------------------------------------ estimated creatinine clearance is 6.3 mL/min (by C-G formula based on Cr of 8.72). ------------------------------------------------------------------------------------------------------------------ No results for input(s): HGBA1C in the last 72 hours. ------------------------------------------------------------------------------------------------------------------ No results for input(s): CHOL, HDL, LDLCALC, TRIG, CHOLHDL, LDLDIRECT in the last 72 hours. ------------------------------------------------------------------------------------------------------------------ No results for input(s): TSH, T4TOTAL, T3FREE, THYROIDAB in the last 72 hours.  Invalid input(s): FREET3 ------------------------------------------------------------------------------------------------------------------ No results for input(s): VITAMINB12, FOLATE, FERRITIN, TIBC, IRON,  RETICCTPCT in the last 72 hours.  Coagulation profile No results for input(s): INR, PROTIME in the last 168 hours.  No results for input(s): DDIMER in the last 72 hours.  Cardiac Enzymes  Recent Labs Lab 06/15/15 1052 06/15/15 1930  06/16/15 0045  TROPONINI 0.05* 0.05* 0.05*   ------------------------------------------------------------------------------------------------------------------ Invalid input(s): POCBNP   Time Spent in minutes   35   Lala Lund K M.D on 06/17/2015 at 10:33 AM  Between 7am to 7pm - Pager - [phone]  After 7pm go to [email or web address] - password [facility]  [facility] -  Office  [phone]

## 2015-06-17 NOTE — Op Note (Signed)
[facility] [place], [id number]   ENDOSCOPY PROCEDURE REPORT  PATIENT: [name], [name]  MR#: [id number] BIRTHDATE: 08-25-1929 , 59  yrs. old GENDER: male ENDOSCOPIST: Gatha Mayer, MD, [facility] PROCEDURE DATE:  06/17/2015 PROCEDURE:  EGD, diagnostic ASA CLASS:     Class III INDICATIONS:  GI bleed. MEDICATIONS: Residual sedation present TOPICAL ANESTHETIC: none  DESCRIPTION OF PROCEDURE: After the risks benefits and alternatives of the procedure were thoroughly explained, informed consent was obtained.  The Pentax Gastroscope [id number] endoscope was introduced through the mouth and advanced to the second portion of the duodenum , Without limitations.  The instrument was slowly withdrawn as the mucosa was fully examined.    Antral non-erosive gastritis - erythema - otherwise normal. Retroflexed views revealed no abnormalities and Retroflexed views revealed .     The scope was then withdrawn from the patient and the procedure completed.  COMPLICATIONS: There were no immediate complications.  ENDOSCOPIC IMPRESSION: Antral non-erosive gastritis - erythema - otherwise normal  RECOMMENDATIONS: Observe and support If no bleeding over next 48 hrs then home. Dx for admit is Colonic diverticular hemorrhage should not need outpatient GI f/u except prn    eSigned:  Gatha Mayer, MD, [facility] 06/17/2015 5:18 PM

## 2015-06-17 NOTE — Op Note (Signed)
[facility] [place], [id number]   COLONOSCOPY PROCEDURE REPORT  PATIENT: [name], [name]  MR#: [id number] BIRTHDATE: January 19, 1929 , 32  yrs. old GENDER: male ENDOSCOPIST: Gatha Mayer, MD, [facility] PROCEDURE DATE:  06/17/2015 PROCEDURE:   Colonoscopy with biopsy and Colonoscopy, diagnostic First Screening Colonoscopy - Avg.  risk and is 50 yrs.  old or older - No.  Prior Negative Screening - Now for repeat screening. N/A  History of Adenoma - Now for follow-up colonoscopy & has been > or = to 3 yrs.  N/A  Polyps removed today? Yes ASA CLASS:   Class III INDICATIONS:Evaluation of unexplained GI bleeding and Patient is not applicable for Colorectal Neoplasm Risk Assessment for this procedure. MEDICATIONS: Fentanyl 25 mcg IV and Versed 4 mg IV  DESCRIPTION OF PROCEDURE:   After the risks benefits and alternatives of the procedure were thoroughly explained, informed consent was obtained.  The digital rectal exam revealed no abnormalities of the rectum.   The Pentax Adult Colon [id number] endoscope was introduced through the anus and advanced to the cecum, which was identified by both the appendix and ileocecal valve. No adverse events experienced.   The quality of the prep was good.  (Nulytley was used)  The instrument was then slowly withdrawn as the colon was fully examined. Estimated blood loss is zero unless otherwise noted in this procedure report.      COLON FINDINGS: A sessile polyp measuring 2 mm in size was found in the descending colon.  A polypectomy was performed with cold forceps.  The resection was complete, the polyp tissue was completely retrieved and sent to histology.   There was moderate diverticulosis noted in the left colon.  Retroflexed views revealed no abnormalities. The time to cecum = 4.3 Withdrawal time = 11.2 The scope was withdrawn and the procedure completed. COMPLICATIONS: There were no immediate  complications.  ENDOSCOPIC IMPRESSION: 1.   Sessile polyp was found in the descending colon; polypectomy was performed with cold forceps 2.   Moderate diverticulosis was noted in the left colon  RECOMMENDATIONS: Think this is diverticular hemorrhage resolved. Will check EGD also to be sure  eSigned:  Gatha Mayer, MD, [facility] 06/17/2015 5:15 PM

## 2015-06-17 NOTE — Care Management Important Message (Signed)
Important Message  Patient Details  Name: [name] MRN: [id number] Date of Birth: 04/04/1929   Medicare Important Message Given:  Yes-second notification given    Delorse Lek 06/17/2015, 3:00 PM

## 2015-06-18 ENCOUNTER — Encounter (HOSPITAL_COMMUNITY): Payer: Self-pay | Admitting: Internal Medicine

## 2015-06-18 LAB — TYPE AND SCREEN
ABO/RH(D): O POS
Antibody Screen: NEGATIVE
Unit division: 0
Unit division: 0

## 2015-06-18 LAB — RENAL FUNCTION PANEL
Albumin: 3.1 g/dL — ABNORMAL LOW (ref 3.5–5.0)
Anion gap: 11 (ref 5–15)
BUN: 39 mg/dL — ABNORMAL HIGH (ref 6–20)
CALCIUM: 8.8 mg/dL — AB (ref 8.9–10.3)
CO2: 27 mmol/L (ref 22–32)
Chloride: 98 mmol/L — ABNORMAL LOW (ref 101–111)
Creatinine, Ser: 7.9 mg/dL — ABNORMAL HIGH (ref 0.61–1.24)
GFR calc non Af Amer: 6 mL/min — ABNORMAL LOW (ref >60)
GFR, EST AFRICAN AMERICAN: 6 mL/min — AB (ref >60)
Glucose, Bld: 90 mg/dL (ref 65–99)
POTASSIUM: 4.6 mmol/L (ref 3.5–5.1)
Phosphorus: 4 mg/dL (ref 2.5–4.6)
Sodium: 136 mmol/L (ref 135–145)

## 2015-06-18 LAB — CBC
HCT: 22.9 % — ABNORMAL LOW (ref 39.0–52.0)
HCT: 22.9 % — ABNORMAL LOW (ref 39.0–52.0)
HEMOGLOBIN: 7.5 g/dL — AB (ref 13.0–17.0)
Hemoglobin: 7.4 g/dL — ABNORMAL LOW (ref 13.0–17.0)
MCH: 30.3 pg (ref 26.0–34.0)
MCH: 30.7 pg (ref 26.0–34.0)
MCHC: 32.3 g/dL (ref 30.0–36.0)
MCHC: 32.8 g/dL (ref 30.0–36.0)
MCV: 93.9 fL (ref 78.0–100.0)
MCV: 93.9 fL (ref 78.0–100.0)
Platelets: 128 10*3/uL — ABNORMAL LOW (ref 150–400)
Platelets: 121 10*3/uL — ABNORMAL LOW (ref 150–400)
RBC: 2.44 MIL/uL — ABNORMAL LOW (ref 4.22–5.81)
RBC: 2.44 MIL/uL — AB (ref 4.22–5.81)
RDW: 16.5 % — AB (ref 11.5–15.5)
RDW: 16.2 % — ABNORMAL HIGH (ref 11.5–15.5)
WBC: 6 10*3/uL (ref 4.0–10.5)
WBC: 5.8 10*3/uL (ref 4.0–10.5)

## 2015-06-18 LAB — PREPARE RBC (CROSSMATCH)

## 2015-06-18 LAB — GLUCOSE, CAPILLARY
GLUCOSE-CAPILLARY: 80 mg/dL (ref 65–99)
Glucose-Capillary: 100 mg/dL — ABNORMAL HIGH (ref 65–99)
Glucose-Capillary: 87 mg/dL (ref 65–99)
Glucose-Capillary: 95 mg/dL (ref 65–99)

## 2015-06-18 MED ORDER — PENTAFLUOROPROP-TETRAFLUOROETH EX AERO: 1.0000 "application " | INHALATION_SPRAY | CUTANEOUS | Status: DC | PRN

## 2015-06-18 MED ORDER — ALTEPLASE 2 MG IJ SOLR
2.0000 mg | Freq: Once | INTRAMUSCULAR | Status: DC | PRN
  Filled 2015-06-18: qty 2

## 2015-06-18 MED ORDER — LIDOCAINE HCL (PF) 1 % IJ SOLN: 5.0000 mL | INTRAMUSCULAR | Status: DC | PRN

## 2015-06-18 MED ORDER — SODIUM CHLORIDE 0.9 % IV SOLN: Freq: Once | INTRAVENOUS | Status: AC

## 2015-06-18 MED ORDER — LIDOCAINE-PRILOCAINE 2.5-2.5 % EX CREA
1.0000 "application " | TOPICAL_CREAM | CUTANEOUS | Status: DC | PRN
  Filled 2015-06-18: qty 5

## 2015-06-18 MED ORDER — SODIUM CHLORIDE 0.9 % IV SOLN: 100.0000 mL | INTRAVENOUS | Status: DC | PRN

## 2015-06-18 MED ORDER — NEPRO/CARBSTEADY PO LIQD: 237.0000 mL | ORAL | Status: DC | PRN

## 2015-06-18 MED ORDER — HEPARIN SODIUM (PORCINE) 1000 UNIT/ML DIALYSIS: 1000.0000 [IU] | INTRAMUSCULAR | Status: DC | PRN

## 2015-06-18 NOTE — Progress Notes (Signed)
Subjective:  Scoped yesterday, feeling OK. Hb 7.4 this am, down from 9 yest am.   Objective Filed Vitals:   06/18/15 0500 06/18/15 0757 06/18/15 0804 06/18/15 0830  BP: 91/62 121/58 124/65 115/58  Pulse: 93 87 88 92  Temp: 98.2 F (36.8 C) 98.3 F (36.8 C)    TempSrc: Oral Oral    Resp: 18 19 20 16   Height:      Weight:  76.2 kg (167 lb 15.9 oz)    SpO2: 98% 93%     Physical Exam General: alert and oriented, on dialysis  Heart: RRR Lungs: CTA, unlabored Abdomen: soft, nontender +BS  Extremities: no edema  Dialysis Access: L AVf +b/t  MWF [facility] 3hrs 30 mins 400/800 68.5kgs 2K/2CA 5400u heparin L AVG Mircera 50 q 5 weeks- last given on 7/27 Calcitriol 1.5 q hd  Assessment/Plan: 1. GIB- prob tic bleed, s/p colon/ EGD yest. Hb down , to get 2u prbc's today.  2. Syncope- MRI- no acute abnormality. ECHO- symptoms consistent with hypovolumia and EEG negative 3. ESRD - MWF [facility]. No hep 4. Hypertension/volume - controlled. No BP meds.   5. Anemia - hgb 9.1-s/p 1 u RBC 8/10 outpt hgb around 11. Micera q 5 weeks, last given 7/27- started ESA. No Fe, tsat 41 6. Metabolic bone disease - phos 3.1and PTH 213- no binder. Cont calcitriol 7. Nutrition - NPO. Alb 3.8 decreased appetite- wt loss- EDW 72 kgs in nov, now 68.5kgs   Kelly Splinter MD pager [phone]    cell [phone] 06/18/2015, 9:32 AM   Additional Objective Labs: Basic Metabolic Panel:  Recent Labs Lab 06/15/15 0414 06/16/15 0830 06/18/15 0820  NA 135 136 136  K 4.6 4.7 4.6  CL 95* 99* 98*  CO2 29 25 27   GLUCOSE 98 92 90  BUN 33* 54* 39*  CREATININE 6.65* 8.72* 7.90*  CALCIUM 8.7* 9.1 8.8*  PHOS  --  3.1 4.0   Liver Function Tests:  Recent Labs Lab 06/15/15 0414 06/16/15 0830 06/18/15 0820  AST 15  --   --   ALT 11*  --   --   ALKPHOS 62  --   --   BILITOT 0.4  --   --   PROT 5.8*  --   --   ALBUMIN 3.4* 3.3* 3.1*   No results for input(s): LIPASE, AMYLASE in the last 168  hours. CBC:  Recent Labs Lab 06/15/15 0414 06/15/15 0810  06/16/15 0830  06/17/15 0908 06/18/15 0343 06/18/15 0820  WBC 6.3 6.8  --  5.3  --   --  6.0 5.8  NEUTROABS  --   --   --  3.5  --   --   --   --   HGB 9.7* 10.3*  < > 8.2*  < > 9.1* 7.4* 7.5*  HCT 29.7* 31.1*  < > 24.6*  < > 27.1* 22.9* 22.9*  MCV 96.1 97.5  --  94.6  --   --  93.9 93.9  PLT 157 161  --  156  --   --  128* 121*  < > = values in this interval not displayed. Blood Culture No results found for: SDES, SPECREQUEST, CULT, REPTSTATUS  Cardiac Enzymes:  Recent Labs Lab 06/15/15 1052 06/15/15 1930 06/16/15 0045  TROPONINI 0.05* 0.05* 0.05*   CBG:  Recent Labs Lab 06/17/15 0605 06/17/15 0731 06/17/15 1141 06/17/15 1756 06/17/15 2354  GLUCAP 89 104* 73 63* 91   Iron Studies: No results for input(s): IRON,  TIBC, TRANSFERRIN, FERRITIN in the last 72 hours. @lablastinr3 @ Studies/Results: Nm Gi Blood Loss  06/16/2015   CLINICAL DATA:  Gastrointestinal bleeding earlier today  EXAM: NUCLEAR MEDICINE GASTROINTESTINAL BLEEDING SCAN  TECHNIQUE: Sequential abdominal and pelvic images were obtained following intravenous administration of Tc-27m labeled red blood cells for a 2 hr time span.  RADIOPHARMACEUTICALS:  24.4 mCi Tc-45m in-vitro labeled red cells.  COMPARISON:  None.  FINDINGS: There is gastric uptake of radiotracer which is not progressed during this study. There is no abnormal radiotracer uptake in the small or large bowel regions.  IMPRESSION: Increased radiotracer uptake in the stomach raises concern for gastritis. No small bowel or large bowel acute gastrointestinal bleeding focus is identified on this study.   Electronically Signed   By: Lowella Grip III M.D.   On: 06/16/2015 21:09   Medications:   . sodium chloride   Intravenous Once  . sodium chloride   Intravenous Once  . allopurinol  100 mg Oral Daily  . calcitRIOL  1.5 mcg Oral Q M,W,F-HD  . citalopram  10 mg Oral Daily  . darbepoetin  (ARANESP) injection - DIALYSIS  100 mcg Intravenous Q Fri-HD  . pantoprazole (PROTONIX) IV  40 mg Intravenous Q12H  . rosuvastatin  20 mg Oral QODAY  . sodium chloride  3 mL Intravenous Q12H

## 2015-06-18 NOTE — Progress Notes (Signed)
Patient Demographics:    [name], is a 81 y.o. male, DOB - [birth year], [id number]  Admit date - 06/14/2015   Admitting Physician [name], MD  Outpatient Primary MD for the patient is [name], MD  LOS - 3   Chief Complaint  Patient presents with  . Loss of Consciousness  . Fall  . GI Bleeding        Subjective:    [name] today has, No headache, No chest pain, No abdominal pain - No Nausea, No new weakness tingling or numbness, No Cough - SOB.     Assessment  & Plan :     1. Syncope with fecal incontinence which was bloody. Was suspicious for seizure activity, however EEG unremarkable and later showed evidence of orthostasis, stable MRI brain and, TTE & EEG, was orthostatic, has been hydrated, will increase activity and monitor orthostatic blood pressures, apply TED stockings. Continue monitoring on telemetry. Monitor H&H.   Question if he became orthostatic on the day of initial presentation as a result of excessive fluid removal during his dialysis run along with LGI bleed.    2. LGI Bleed x 3 . Seen by GI and underwent flexible sigmoidoscopy on 06/15/2015 followed by EGD and colonoscopy on 06/18/2015, results were mild nonerosive gastritis along with diverticular disease, patient most likely has had painless diverticular lower GI bleed. Tagged RBC scan shows mildly increased activity in the stomach but no signs of ongoing bleeding. Continue PPI. He has so far received 1 unit of packed RBC transfusion on 06/16/2015 after a large bright red blood per rectum, will get another 2 units with dialysis on 06/18/2015. Bleeding has likely resolved. Continue to monitor H&H.   3. ESRD, Monday Wednesday Friday schedule renal on board.   4. Dyslipidemia. Home dose statin  resumed.   5. Essential hypertension. Blood pressure stable hold off blood pressure medications and monitor orthostatics.   6. Mild elevation in troponin. Nonspecific and a ESRD patient, troponin flat lined at 0.05, non-ACS pattern. EKG is nonacute. Chest pain-free. Echocardiogram stable discussed with reading physician Dr. Sallyanne Kuster. Echo essentially consistent with hypovolemia.   Code Status : Full  Family Communication  : wife  Disposition Plan  : Home 1-2 days  Consults  :  Neuro, renal  Procedures  :    MRI Brain - no acute changes  EEG - no seizure-like activity noted  Flexible sigmoidoscopy by Dr. Silvano Rusk on 06/15/2015. Showing diverticulosis and some blood in the colon.  EGD and colonoscopy. EGD showing mild nonerosive gastritis, colonoscopy diverticular disease. No signs of active bleeding.  Tagged RBC scan 06/16/2015. No active bleeding. Some increased activity in the stomach.   TTE  - Left ventricle: The cavity size was normal. Systolic function washyperdynamic. The estimated ejection fraction was in the range of70% to 75%. There was dynamic obstruction in the mid cavity, witha peak velocity of 200 cm/sec and a peak gradient of 16 mm Hg. Wall motion was normal; there were no regional wall motionabnormalities. There was an increased relative contribution ofatrial contraction to ventricular filling, which may be due tohypovolemia. - Aortic valve: Valve area (VTI): 2.73 cm^2. Valve area (Vmax): 2.05 cm^2. Valve area (Vmean): 2.14 cm^2. - Right ventricle: Systolic function  was hyperdynamic. - Pulmonary arteries: PA peak pressure: 33 mm Hg (S). - Systemic veins: The inferior vena cava is collapsed.  Impressions:  Findings are consistent with hypovolemia    DVT Prophylaxis  :    SCDs   Lab Results  Component Value Date   PLT 121* 06/18/2015    Inpatient Medications  Scheduled Meds: . sodium chloride   Intravenous Once  . sodium chloride    Intravenous Once  . allopurinol  100 mg Oral Daily  . calcitRIOL  1.5 mcg Oral Q M,W,F-HD  . citalopram  10 mg Oral Daily  . darbepoetin (ARANESP) injection - DIALYSIS  100 mcg Intravenous Q Fri-HD  . pantoprazole (PROTONIX) IV  40 mg Intravenous Q12H  . rosuvastatin  20 mg Oral QODAY  . sodium chloride  3 mL Intravenous Q12H   Continuous Infusions:   PRN Meds:.sodium chloride, sodium chloride, acetaminophen **OR** acetaminophen, alteplase, diphenhydrAMINE, feeding supplement (NEPRO CARB STEADY), heparin, lidocaine (PF), lidocaine-prilocaine, ondansetron **OR** ondansetron (ZOFRAN) IV, pentafluoroprop-tetrafluoroeth  Antibiotics  :    Anti-infectives    None        Objective:   Filed Vitals:   06/18/15 1030 06/18/15 1045 06/18/15 1100 06/18/15 1108  BP: 120/59 109/68 125/67 126/64  Pulse: 92 68 94 87  Temp: 98 F (36.7 C) 97 F (36.1 C) 98 F (36.7 C)   TempSrc: Oral Oral Oral   Resp: 15 18 14 18   Height:      Weight:      SpO2:        Wt Readings from Last 3 Encounters:  06/18/15 76.2 kg (167 lb 15.9 oz)  05/06/15 69.4 kg (153 lb)  04/13/15 69.854 kg (154 lb)     Intake/Output Summary (Last 24 hours) at 06/18/15 1142 Last data filed at 06/18/15 1108  Gross per 24 hour  Intake    460 ml  Output    600 ml  Net   -140 ml     Physical Exam  Awake Alert, Oriented X 3, No new F.N deficits, Normal affect [place].AT,PERRAL Supple Neck,No JVD, No cervical lymphadenopathy appriciated.  Symmetrical Chest wall movement, Good air movement bilaterally, CTAB RRR,No Gallops,Rubs or new Murmurs, No Parasternal Heave +ve B.Sounds, Abd Soft, No tenderness, No organomegaly appriciated, No rebound - guarding or rigidity. No Cyanosis, Clubbing or edema, No new Rash or bruise       Data Review:   Micro Results No results found for this or any previous visit (from the past 240 hour(s)).  Radiology Reports Mr Brain Wo Contrast  06/15/2015   CLINICAL DATA:  Syncopal episode  while straining for a bowel movement. Rectal bleeding.  EXAM: MRI HEAD WITHOUT CONTRAST  TECHNIQUE: Multiplanar, multiecho pulse sequences of the brain and surrounding structures were obtained without intravenous contrast.  COMPARISON:  MRI brain 06/26/2011.  FINDINGS: The diffusion-weighted images demonstrate no evidence for acute or subacute infarction. Moderate periventricular and subcortical T2 changes are similar to the prior study. Moderate generalized atrophy is again noted. The ventricles are proportionate to the degree of atrophy.  No significant extra-axial fluid collection is present. Flow is present in the major intracranial arteries. The globes and orbits are intact. Mild mucosal thickening is present in the right frontal sinus and anterior ethmoid air cells. The mastoid air cells are clear. The paranasal sinuses are otherwise clear.  The skullbase is within normal limits. Midline intracranial contents are normal.  Moderate spondylosis is present in the upper cervical spine grade 1 retrolisthesis is  present at C3-4 with progressive endplate degenerative change. There is chronic loss of disc height at C4-5 and C5-6.  IMPRESSION: 1. No acute intracranial abnormality. 2. Moderate atrophy and white matter disease is similar to the prior study. This likely reflects the sequela of chronic microvascular ischemia. 3. Moderate spondylosis in the upper cervical spine as described.   Electronically Signed   By: San Morelle M.D.   On: 06/15/2015 09:55   Nm Gi Blood Loss  06/16/2015   CLINICAL DATA:  Gastrointestinal bleeding earlier today  EXAM: NUCLEAR MEDICINE GASTROINTESTINAL BLEEDING SCAN  TECHNIQUE: Sequential abdominal and pelvic images were obtained following intravenous administration of Tc-37m labeled red blood cells for a 2 hr time span.  RADIOPHARMACEUTICALS:  24.4 mCi Tc-34m in-vitro labeled red cells.  COMPARISON:  None.  FINDINGS: There is gastric uptake of radiotracer which is not  progressed during this study. There is no abnormal radiotracer uptake in the small or large bowel regions.  IMPRESSION: Increased radiotracer uptake in the stomach raises concern for gastritis. No small bowel or large bowel acute gastrointestinal bleeding focus is identified on this study.   Electronically Signed   By: Lowella Grip III M.D.   On: 06/16/2015 21:09     CBC  Recent Labs Lab 06/15/15 0414 06/15/15 0810  06/16/15 0830 06/16/15 1513 06/17/15 0145 06/17/15 0908 06/18/15 0343 06/18/15 0820  WBC 6.3 6.8  --  5.3  --   --   --  6.0 5.8  HGB 9.7* 10.3*  < > 8.2* 8.3* 8.9* 9.1* 7.4* 7.5*  HCT 29.7* 31.1*  < > 24.6* 24.8* 26.5* 27.1* 22.9* 22.9*  PLT 157 161  --  156  --   --   --  128* 121*  MCV 96.1 97.5  --  94.6  --   --   --  93.9 93.9  MCH 31.4 32.3  --  31.5  --   --   --  30.3 30.7  MCHC 32.7 33.1  --  33.3  --   --   --  32.3 32.8  RDW 15.6* 15.4  --  15.5  --   --   --  16.5* 16.2*  LYMPHSABS  --   --   --  1.3  --   --   --   --   --   MONOABS  --   --   --  0.3  --   --   --   --   --   EOSABS  --   --   --  0.1  --   --   --   --   --   BASOSABS  --   --   --  0.0  --   --   --   --   --   < > = values in this interval not displayed.  Chemistries   Recent Labs Lab 06/14/15 2356 06/15/15 0414 06/16/15 0830 06/18/15 0820  NA 137 135 136 136  K 4.3 4.6 4.7 4.6  CL 94* 95* 99* 98*  CO2 32 29 25 27   GLUCOSE 129* 98 92 90  BUN 26* 33* 54* 39*  CREATININE 6.43* 6.65* 8.72* 7.90*  CALCIUM 8.9 8.7* 9.1 8.8*  AST  --  15  --   --   ALT  --  11*  --   --   ALKPHOS  --  62  --   --   BILITOT  --  0.4  --   --    ------------------------------------------------------------------------------------------------------------------  estimated creatinine clearance is 7.1 mL/min (by C-G formula based on Cr of 7.9). ------------------------------------------------------------------------------------------------------------------ No results for input(s): HGBA1C in  the last 72 hours. ------------------------------------------------------------------------------------------------------------------ No results for input(s): CHOL, HDL, LDLCALC, TRIG, CHOLHDL, LDLDIRECT in the last 72 hours. ------------------------------------------------------------------------------------------------------------------ No results for input(s): TSH, T4TOTAL, T3FREE, THYROIDAB in the last 72 hours.  Invalid input(s): FREET3 ------------------------------------------------------------------------------------------------------------------ No results for input(s): VITAMINB12, FOLATE, FERRITIN, TIBC, IRON, RETICCTPCT in the last 72 hours.  Coagulation profile No results for input(s): INR, PROTIME in the last 168 hours.  No results for input(s): DDIMER in the last 72 hours.  Cardiac Enzymes  Recent Labs Lab 06/15/15 1052 06/15/15 1930 06/16/15 0045  TROPONINI 0.05* 0.05* 0.05*   ------------------------------------------------------------------------------------------------------------------ Invalid input(s): POCBNP   Time Spent in minutes   35   Inara Dike K M.D on 06/18/2015 at 11:42 AM  Between 7am to 7pm - Pager - [phone]  After 7pm go to [email or web address] - password [facility]  [facility] -  Office  [phone]

## 2015-06-18 NOTE — Progress Notes (Signed)
   Patient Name: [name] Date of Encounter: 06/18/2015, 5:06 PM    Subjective  No further bleeding when seen this AM   Objective  BP 141/65 mmHg  Pulse 95  Temp(Src) 97.2 F (36.2 C) (Oral)  Resp 16  Ht 5\' 9"  (1.753 m)  Wt 167 lb 15.9 oz (76.2 kg)  BMI 24.80 kg/m2  SpO2 95%  NAD Chronically ill CBC Latest Ref Rng 06/18/2015 06/18/2015 06/17/2015  WBC 4.0 - 10.5 K/uL 5.8 6.0 -  Hemoglobin 13.0 - 17.0 g/dL 7.5(L) 7.4(L) 9.1(L)  Hematocrit 39.0 - 52.0 % 22.9(L) 22.9(L) 27.1(L)  Platelets 150 - 400 K/uL 121(L) 128(L) -      Assessment and Plan  Diverticular hemorrhage - resolving  Plan home tomorrow if ok. Please call Dr. Benson Norway if ? As I will not ask him to see pt  Gatha Mayer, MD, [facility] [phone] (pager) 06/18/2015 5:06 PM

## 2015-06-18 NOTE — Progress Notes (Signed)
Physical Therapy Treatment Patient Details Name: [name] MRN: [id number] DOB: Feb 07, 1929 Today's Date: 06/18/2015    History of Present Illness 81 yo male admitted with GI bleed, syncopal episodes. Hx or ESRD, HD-MWF, HTN, prostate cancer.     PT Comments    Progressing well with mobility. No c/o lightheadedness/dizziness today. No syncopal episodes. Recommend ambulation with nursing prior to d/c  Follow Up Recommendations  Home health PT;Supervision for mobility/OOB     Equipment Recommendations  None recommended by PT    Recommendations for Other Services       Precautions / Restrictions Precautions Precautions: Fall Precaution Comments: syncopal episodes  Restrictions Weight Bearing Restrictions: No    Mobility  Bed Mobility               General bed mobility comments: pt oob in recliner  Transfers Overall transfer level: Needs assistance   Transfers: Sit to/from Stand Sit to Stand: Supervision         General transfer comment: for safety  Ambulation/Gait Ambulation/Gait assistance: Min guard Ambulation Distance (Feet): 275 Feet Assistive device: None Gait Pattern/deviations: Decreased stride length;Step-through pattern     General Gait Details: slow steady gait pattern. No c/o lightheadedness, dizziness. close guard for safety   Stairs            Wheelchair Mobility    Modified Rankin (Stroke Patients Only)       Balance                                    Cognition Arousal/Alertness: Awake/alert Behavior During Therapy: WFL for tasks assessed/performed Overall Cognitive Status: Within Functional Limits for tasks assessed                      Exercises      General Comments        Pertinent Vitals/Pain Pain Assessment: No/denies pain    Home Living                      Prior Function            PT Goals (current goals can now be found in the care plan section) Progress  towards PT goals: Progressing toward goals    Frequency  Min 3X/week    PT Plan Current plan remains appropriate    Co-evaluation             End of Session Equipment Utilized During Treatment: Gait belt Activity Tolerance: Patient tolerated treatment well Patient left: in chair;with call bell/phone within reach;with family/visitor present     Time: [id number] PT Time Calculation (min) (ACUTE ONLY): 10 min  Charges:  $Gait Training: 8-22 mins                    G Codes:      Weston Anna, MPT Pager: [phone]

## 2015-06-19 LAB — TYPE AND SCREEN
ABO/RH(D): O POS
Antibody Screen: NEGATIVE
UNIT DIVISION: 0
Unit division: 0

## 2015-06-19 LAB — HEMOGLOBIN AND HEMATOCRIT, BLOOD
HEMATOCRIT: 28.7 % — AB (ref 39.0–52.0)
HEMOGLOBIN: 9.5 g/dL — AB (ref 13.0–17.0)

## 2015-06-19 LAB — GLUCOSE, CAPILLARY
GLUCOSE-CAPILLARY: 104 mg/dL — AB (ref 65–99)
GLUCOSE-CAPILLARY: 113 mg/dL — AB (ref 65–99)
Glucose-Capillary: 95 mg/dL (ref 65–99)

## 2015-06-19 MED ORDER — RENA-VITE PO TABS
1.0000 | ORAL_TABLET | Freq: Every day | ORAL | Status: DC
  Administered 2015-06-19 – 2015-06-21 (×3): 1 via ORAL
  Filled 2015-06-19 (×3): qty 1

## 2015-06-19 NOTE — Progress Notes (Signed)
Patient Demographics:    [name], is a 81 y.o. male, DOB - 10-06-1929, [id number]  Admit date - 06/14/2015   Admitting Physician [name], MD  Outpatient Primary MD for the patient is Gabriel Cower, MD  LOS - 4   Chief Complaint  Patient presents with  . Loss of Consciousness  . Fall  . GI Bleeding        Subjective:    [name] today has, No headache, No chest pain, No abdominal pain - No Nausea, No new weakness tingling or numbness, No Cough - SOB.     Assessment  & Plan :     1. Syncope with fecal incontinence which was bloody. Was suspicious for seizure activity, however EEG unremarkable and later showed evidence of orthostasis, stable MRI brain and, TTE & EEG, was orthostatic, has been hydrated, will increase activity and monitor orthostatic blood pressures, apply TED stockings. Continue monitoring on telemetry. Monitor H&H.   Question if he became orthostatic on the day of initial presentation as a result of excessive fluid removal during his dialysis run along with LGI bleed.    2. LGI Bleed x 3 . Seen by GI and underwent flexible sigmoidoscopy on 06/15/2015 followed by EGD and colonoscopy on 06/18/2015, results were mild nonerosive gastritis along with diverticular disease, patient most likely has had painless diverticular lower GI bleed. Tagged RBC scan shows mildly increased activity in the stomach but no signs of ongoing bleeding. Continue PPI. He has so far received 1 unit of packed RBC transfusion on 06/16/2015 after a large bright red blood per rectum, will get another 2 units with dialysis on 06/18/2015. Bleeding has likely resolved. Continue to monitor H&H. No further bleeding will be discharged in the morning.   3. ESRD, Monday Wednesday Friday schedule renal on  board.   4. Dyslipidemia. Home dose statin resumed.   5. Essential hypertension. Blood pressure stable hold off blood pressure medications and monitor orthostatics.   6. Mild elevation in troponin. Nonspecific and a ESRD patient, troponin flat lined at 0.05, non-ACS pattern. EKG is nonacute. Chest pain-free. Echocardiogram stable discussed with reading physician Dr. Sallyanne Kuster. Echo essentially consistent with hypovolemia.    Code Status : Full  Family Communication  : wife  Disposition Plan  : Home 1-2 days  Consults  :  Neuro, renal  Procedures  :    MRI Brain - no acute changes  EEG - no seizure-like activity noted  Flexible sigmoidoscopy by Dr. Silvano Rusk on 06/15/2015. Showing diverticulosis and some blood in the colon.  EGD and colonoscopy. EGD showing mild nonerosive gastritis, colonoscopy diverticular disease. No signs of active bleeding.  Tagged RBC scan 06/16/2015. No active bleeding. Some increased activity in the stomach.   TTE  - Left ventricle: The cavity size was normal. Systolic function washyperdynamic. The estimated ejection fraction was in the range of70% to 75%. There was dynamic obstruction in the mid cavity, witha peak velocity of 200 cm/sec and a peak gradient of 16 mm Hg. Wall motion was normal; there were no regional wall motionabnormalities. There was an increased relative contribution ofatrial contraction to ventricular filling, which may be due tohypovolemia. - Aortic valve: Valve area (VTI): 2.73 cm^2. Valve area (Vmax): 2.05 cm^2.  Valve area (Vmean): 2.14 cm^2. - Right ventricle: Systolic function was hyperdynamic. - Pulmonary arteries: PA peak pressure: 33 mm Hg (S). - Systemic veins: The inferior vena cava is collapsed.  Impressions:  Findings are consistent with hypovolemia    DVT Prophylaxis  :    SCDs   Lab Results  Component Value Date   PLT 121* 06/18/2015    Inpatient Medications  Scheduled Meds: . sodium chloride    Intravenous Once  . allopurinol  100 mg Oral Daily  . calcitRIOL  1.5 mcg Oral Q M,W,F-HD  . citalopram  10 mg Oral Daily  . darbepoetin (ARANESP) injection - DIALYSIS  100 mcg Intravenous Q Fri-HD  . multivitamin  1 tablet Oral QHS  . pantoprazole (PROTONIX) IV  40 mg Intravenous Q12H  . rosuvastatin  20 mg Oral QODAY  . sodium chloride  3 mL Intravenous Q12H   Continuous Infusions:   PRN Meds:.sodium chloride, sodium chloride, acetaminophen **OR** acetaminophen, alteplase, diphenhydrAMINE, feeding supplement (NEPRO CARB STEADY), heparin, lidocaine (PF), lidocaine-prilocaine, ondansetron **OR** ondansetron (ZOFRAN) IV, pentafluoroprop-tetrafluoroeth  Antibiotics  :    Anti-infectives    None        Objective:   Filed Vitals:   06/18/15 1222 06/18/15 1716 06/19/15 0500 06/19/15 0746  BP: 141/65 134/75 115/58 133/59  Pulse: 95 89 94 95  Temp: 97.2 F (36.2 C) 98.4 F (36.9 C) 97.6 F (36.4 C) 98.6 F (37 C)  TempSrc: Oral Oral Oral Oral  Resp: 16 18 18 18   Height:      Weight:  72.2 kg (159 lb 2.8 oz)    SpO2: 95% 97% 99% 99%    Wt Readings from Last 3 Encounters:  06/18/15 72.2 kg (159 lb 2.8 oz)  05/06/15 69.4 kg (153 lb)  04/13/15 69.854 kg (154 lb)     Intake/Output Summary (Last 24 hours) at 06/19/15 1255 Last data filed at 06/19/15 0858  Gross per 24 hour  Intake    240 ml  Output    100 ml  Net    140 ml     Physical Exam  Awake Alert, Oriented X 3, No new F.N deficits, Normal affect [place].AT,PERRAL Supple Neck,No JVD, No cervical lymphadenopathy appriciated.  Symmetrical Chest wall movement, Good air movement bilaterally, CTAB RRR,No Gallops,Rubs or new Murmurs, No Parasternal Heave +ve B.Sounds, Abd Soft, No tenderness, No organomegaly appriciated, No rebound - guarding or rigidity. No Cyanosis, Clubbing or edema, No new Rash or bruise       Data Review:   Micro Results No results found for this or any previous visit (from the past 240  hour(s)).  Radiology Reports Mr Brain Wo Contrast  06/15/2015   CLINICAL DATA:  Syncopal episode while straining for a bowel movement. Rectal bleeding.  EXAM: MRI HEAD WITHOUT CONTRAST  TECHNIQUE: Multiplanar, multiecho pulse sequences of the brain and surrounding structures were obtained without intravenous contrast.  COMPARISON:  MRI brain 06/26/2011.  FINDINGS: The diffusion-weighted images demonstrate no evidence for acute or subacute infarction. Moderate periventricular and subcortical T2 changes are similar to the prior study. Moderate generalized atrophy is again noted. The ventricles are proportionate to the degree of atrophy.  No significant extra-axial fluid collection is present. Flow is present in the major intracranial arteries. The globes and orbits are intact. Mild mucosal thickening is present in the right frontal sinus and anterior ethmoid air cells. The mastoid air cells are clear. The paranasal sinuses are otherwise clear.  The skullbase is within normal limits.  Midline intracranial contents are normal.  Moderate spondylosis is present in the upper cervical spine grade 1 retrolisthesis is present at C3-4 with progressive endplate degenerative change. There is chronic loss of disc height at C4-5 and C5-6.  IMPRESSION: 1. No acute intracranial abnormality. 2. Moderate atrophy and white matter disease is similar to the prior study. This likely reflects the sequela of chronic microvascular ischemia. 3. Moderate spondylosis in the upper cervical spine as described.   Electronically Signed   By: San Morelle M.D.   On: 06/15/2015 09:55   Nm Gi Blood Loss  06/16/2015   CLINICAL DATA:  Gastrointestinal bleeding earlier today  EXAM: NUCLEAR MEDICINE GASTROINTESTINAL BLEEDING SCAN  TECHNIQUE: Sequential abdominal and pelvic images were obtained following intravenous administration of Tc-59m labeled red blood cells for a 2 hr time span.  RADIOPHARMACEUTICALS:  24.4 mCi Tc-27m in-vitro labeled  red cells.  COMPARISON:  None.  FINDINGS: There is gastric uptake of radiotracer which is not progressed during this study. There is no abnormal radiotracer uptake in the small or large bowel regions.  IMPRESSION: Increased radiotracer uptake in the stomach raises concern for gastritis. No small bowel or large bowel acute gastrointestinal bleeding focus is identified on this study.   Electronically Signed   By: Lowella Grip III M.D.   On: 06/16/2015 21:09     CBC  Recent Labs Lab 06/15/15 0414 06/15/15 0810  06/16/15 0830  06/17/15 0145 06/17/15 0908 06/18/15 0343 06/18/15 0820 06/19/15 0925  WBC 6.3 6.8  --  5.3  --   --   --  6.0 5.8  --   HGB 9.7* 10.3*  < > 8.2*  < > 8.9* 9.1* 7.4* 7.5* 9.5*  HCT 29.7* 31.1*  < > 24.6*  < > 26.5* 27.1* 22.9* 22.9* 28.7*  PLT 157 161  --  156  --   --   --  128* 121*  --   MCV 96.1 97.5  --  94.6  --   --   --  93.9 93.9  --   MCH 31.4 32.3  --  31.5  --   --   --  30.3 30.7  --   MCHC 32.7 33.1  --  33.3  --   --   --  32.3 32.8  --   RDW 15.6* 15.4  --  15.5  --   --   --  16.5* 16.2*  --   LYMPHSABS  --   --   --  1.3  --   --   --   --   --   --   MONOABS  --   --   --  0.3  --   --   --   --   --   --   EOSABS  --   --   --  0.1  --   --   --   --   --   --   BASOSABS  --   --   --  0.0  --   --   --   --   --   --   < > = values in this interval not displayed.  Chemistries   Recent Labs Lab 06/14/15 2356 06/15/15 0414 06/16/15 0830 06/18/15 0820  NA 137 135 136 136  K 4.3 4.6 4.7 4.6  CL 94* 95* 99* 98*  CO2 32 29 25 27   GLUCOSE 129* 98 92 90  BUN 26* 33* 54* 39*  CREATININE 6.43* 6.65* 8.72* 7.90*  CALCIUM 8.9 8.7* 9.1 8.8*  AST  --  15  --   --   ALT  --  11*  --   --   ALKPHOS  --  62  --   --   BILITOT  --  0.4  --   --    ------------------------------------------------------------------------------------------------------------------ estimated creatinine clearance is 7.1 mL/min (by C-G formula based on Cr of  7.9). ------------------------------------------------------------------------------------------------------------------ No results for input(s): HGBA1C in the last 72 hours. ------------------------------------------------------------------------------------------------------------------ No results for input(s): CHOL, HDL, LDLCALC, TRIG, CHOLHDL, LDLDIRECT in the last 72 hours. ------------------------------------------------------------------------------------------------------------------ No results for input(s): TSH, T4TOTAL, T3FREE, THYROIDAB in the last 72 hours.  Invalid input(s): FREET3 ------------------------------------------------------------------------------------------------------------------ No results for input(s): VITAMINB12, FOLATE, FERRITIN, TIBC, IRON, RETICCTPCT in the last 72 hours.  Coagulation profile No results for input(s): INR, PROTIME in the last 168 hours.  No results for input(s): DDIMER in the last 72 hours.  Cardiac Enzymes  Recent Labs Lab 06/15/15 1052 06/15/15 1930 06/16/15 0045  TROPONINI 0.05* 0.05* 0.05*   ------------------------------------------------------------------------------------------------------------------ Invalid input(s): POCBNP   Time Spent in minutes   35   SINGH,PRASHANT K M.D on 06/19/2015 at 12:55 PM  Between 7am to 7pm - Pager - [phone]  After 7pm go to [email or web address] - password [facility]  [facility] -  Office  [phone]

## 2015-06-19 NOTE — Care Management Note (Addendum)
Case Management Note  Patient Details  Name: [name] MRN: [id number] Date of Birth: 30-Dec-1928  Subjective/Objective:    S/p Loss of consciousness, fall , GI bleed, transfusion 2URBC Hgb 7.5 post transfusion 9.5,  HD  M/W/F                Action/Plan: Discharge home with [facility] services  Expected Discharge Date:     06/20/15             Expected Discharge Plan:  [place]  In-House Referral:     Discharge planning Services  Follow-up appt scheduled  Post Acute Care Choice:    Choice offered to:  Patient, Spouse  DME Arranged:  [facility] Rile rolling DME Agency:  [place] Arranged:  RN, PT, OT [facility] Agency:  [place]  Status of Service:  Completed, signed off  Medicare Important Message Given:  Yes-second notification given Date Medicare IM Given:    Medicare IM give by:    Date Additional Medicare IM Given:    Additional Medicare Important Message give by:     If discussed at [place] of Stay Meetings, dates discussed:    Additional Comments:  CM met with patient and spouse at bedside. Discussed the recommendation for [facility] services RN PT/OT. Patient and wife are agreeable with recommendations. Choice offered selected [facility]. Rolling walker ordered, patient selected [facility] as DME provider. Contacted [place] DME rep from [facility] will be delivered to room prior to discharge.  Verified patient information and correct address Explained to patient and spouse [facility] will contact the patient 24- 48 hrs post discharge. Patient is amendable to discharge plan . No further CM needs identified.  Laurena Slimmer, RN 06/19/2015, 1:58 PM

## 2015-06-19 NOTE — Progress Notes (Signed)
[facility] Progress Note  Assessment/Plan: 1. GIB- prob tic bleed, s/p colon/ EGD 06/17/15. Resolving diverticular hemorrhage.  2u prbc's 008/12/16. HGB pending 2. Syncope- MRI- no acute abnormality. ECHO- symptoms co/nsistent with hypovolumia and EEG negative 3. ESRD - [facility]. No hep.  4. Hypertension/volume - controlled. No BP meds. HD Yesterday Net UF 500. Post wt 70 kg. BP 109/68-141/65.  5. Anemia - hgb pending. On ESA.   6. Metabolic bone disease - Ca 8.8 C Ca 9.5 phos 4.0. Cont calcitriol. Not on binders. 7. Nutrition - renal diet/renal vit. Alb 3.1 Dietary consult for protein malnutrition.   Rita H. Brown NP-C 06/19/2015, 12:31 PM  [facility] [phone]  Pt seen, examined and agree w A/P as above.  Kelly Splinter MD pager [phone]    cell [phone] 06/19/2015, 12:32 PM    Subjective:  "I feel better". States he had stool yesterday with sm. Amt bright red blood but flushed it before anyone saw it.  Objective Filed Vitals:   06/18/15 1222 06/18/15 1716 06/19/15 0500 06/19/15 0746  BP: 141/65 134/75 115/58 133/59  Pulse: 95 89 94 95  Temp: 97.2 F (36.2 C) 98.4 F (36.9 C) 97.6 F (36.4 C) 98.6 F (37 C)  TempSrc: Oral Oral Oral Oral  Resp: 16 18 18 18   Height:      Weight:  72.2 kg (159 lb 2.8 oz)    SpO2: 95% 97% 99% 99%   Physical Exam General: Alert, pleasant AA male, NAD Heart: S1, S2 RRR Lungs: Bilateral breath sounds CTA Abdomen: Abdomen soft, nontender, active BS X 4 Extremities: No LE edema Dialysis Access: LUA AVF + thrill/+ bruit  [facility] 3hrs 30 mins 400/800 68.5kgs 2K/2CA 5400u heparin L AVG Mircera 50 q 5 weeks- last given on 7/27 Calcitriol 1.5 q hd Additional Objective Labs: Basic Metabolic Panel:  Recent Labs Lab 06/15/15 0414 06/16/15 0830 06/18/15 0820  NA 135 136 136  K 4.6 4.7 4.6  CL 95* 99* 98*  CO2 29 25 27   GLUCOSE 98 92 90  BUN 33* 54* 39*  CREATININE 6.65* 8.72*  7.90*  CALCIUM 8.7* 9.1 8.8*  PHOS  --  3.1 4.0   Liver Function Tests:  Recent Labs Lab 06/15/15 0414 06/16/15 0830 06/18/15 0820  AST 15  --   --   ALT 11*  --   --   ALKPHOS 62  --   --   BILITOT 0.4  --   --   PROT 5.8*  --   --   ALBUMIN 3.4* 3.3* 3.1*   No results for input(s): LIPASE, AMYLASE in the last 168 hours. CBC:  Recent Labs Lab 06/15/15 0414 06/15/15 0810  06/16/15 0830  06/18/15 0343 06/18/15 0820 06/19/15 0925  WBC 6.3 6.8  --  5.3  --  6.0 5.8  --   NEUTROABS  --   --   --  3.5  --   --   --   --   HGB 9.7* 10.3*  < > 8.2*  < > 7.4* 7.5* 9.5*  HCT 29.7* 31.1*  < > 24.6*  < > 22.9* 22.9* 28.7*  MCV 96.1 97.5  --  94.6  --  93.9 93.9  --   PLT 157 161  --  156  --  128* 121*  --   < > = values in this interval not displayed. Blood Culture No results found for: SDES, SPECREQUEST, CULT, REPTSTATUS  Cardiac Enzymes:  Recent Labs Lab  06/15/15 1052 06/15/15 1930 06/16/15 0045  TROPONINI 0.05* 0.05* 0.05*   CBG:  Recent Labs Lab 06/18/15 1216 06/18/15 1616 06/18/15 2350 06/19/15 0720 06/19/15 1216  GLUCAP 80 100* 95 95 104*   Iron Studies: No results for input(s): IRON, TIBC, TRANSFERRIN, FERRITIN in the last 72 hours. @lablastinr3 @ Studies/Results: No results found. Medications:   . sodium chloride   Intravenous Once  . allopurinol  100 mg Oral Daily  . calcitRIOL  1.5 mcg Oral Q M,W,F-HD  . citalopram  10 mg Oral Daily  . darbepoetin (ARANESP) injection - DIALYSIS  100 mcg Intravenous Q Fri-HD  . multivitamin  1 tablet Oral QHS  . pantoprazole (PROTONIX) IV  40 mg Intravenous Q12H  . rosuvastatin  20 mg Oral QODAY  . sodium chloride  3 mL Intravenous Q12H

## 2015-06-19 NOTE — Progress Notes (Signed)
Noted to have small amount of red bright red stools. Will continue to monitor.

## 2015-06-20 LAB — HEMOGLOBIN AND HEMATOCRIT, BLOOD
HCT: 23.2 % — ABNORMAL LOW (ref 39.0–52.0)
HEMATOCRIT: 24.7 % — AB (ref 39.0–52.0)
HEMOGLOBIN: 7.7 g/dL — AB (ref 13.0–17.0)
Hemoglobin: 8.2 g/dL — ABNORMAL LOW (ref 13.0–17.0)

## 2015-06-20 LAB — GLUCOSE, CAPILLARY
GLUCOSE-CAPILLARY: 92 mg/dL (ref 65–99)
GLUCOSE-CAPILLARY: 103 mg/dL — AB (ref 65–99)
Glucose-Capillary: 103 mg/dL — ABNORMAL HIGH (ref 65–99)

## 2015-06-20 MED ORDER — METOPROLOL TARTRATE 25 MG PO TABS
25.0000 mg | ORAL_TABLET | Freq: Two times a day (BID) | ORAL | Status: DC
  Administered 2015-06-20 (×2): 25 mg via ORAL
  Filled 2015-06-20 (×3): qty 1

## 2015-06-20 NOTE — Progress Notes (Signed)
Patient Demographics:    [name], is a 82 y.o. male, DOB - 01-Jun-1929, [id number]  Admit date - 06/14/2015   Admitting Physician [name], MD  Outpatient Primary MD for the patient is [name], MD  LOS - 5   Chief Complaint  Patient presents with  . Loss of Consciousness  . Fall  . GI Bleeding        Subjective:    [name] today has, No headache, No chest pain, No abdominal pain - No Nausea, No new weakness tingling or numbness, No Cough - SOB.     Assessment  & Plan :     1. Syncope with fecal incontinence which was bloody. Was suspicious for seizure activity, however EEG unremarkable and later showed evidence of orthostasis, stable MRI brain and, TTE & EEG, was orthostatic, has been hydrated, will increase activity and monitor orthostatic blood pressures, apply TED stockings. Continue monitoring on telemetry. Monitor H&H.   Question if he became orthostatic on the day of initial presentation as a result of excessive fluid removal during his dialysis run along with LGI bleed.    2. LGI Bleed x 3 . Seen by GI and underwent flexible sigmoidoscopy on 06/15/2015 followed by EGD and colonoscopy on 06/18/2015, results were mild nonerosive gastritis along with diverticular disease, patient most likely has had painless diverticular lower GI bleed. Tagged RBC scan shows mildly increased activity in the stomach but no signs of ongoing bleeding. Continue PPI. He has so far received 1 unit of packed RBC transfusion on 06/16/2015 after a large bright red blood per rectum, will get another 2 units with dialysis on 06/18/2015. Bleeding has likely resolved. Continue to monitor H&H. If no bleeding for 24 hours will be discharged.   3. ESRD, Monday Wednesday Friday schedule renal on  board.   4. Dyslipidemia. Home dose statin resumed.   5. Essential hypertension. Blood pressure stable hold off blood pressure medications and monitor orthostatics.   6. Mild elevation in troponin. Nonspecific and a ESRD patient, troponin flat lined at 0.05, non-ACS pattern. EKG is nonacute. Chest pain-free. Echocardiogram stable discussed with reading physician Dr. Sallyanne Kuster. Echo essentially consistent with hypovolemia.    Code Status : Full  Family Communication  : wife  Disposition Plan  : Home 1-2 days  Consults  :  Neuro, renal  Procedures  :    MRI Brain - no acute changes  EEG - no seizure-like activity noted  Flexible sigmoidoscopy by Dr. Silvano Rusk on 06/15/2015. Showing diverticulosis and some blood in the colon.  EGD and Colonoscopy. EGD showing mild nonerosive gastritis, colonoscopy diverticular disease. No signs of active bleeding.  Tagged RBC scan 06/16/2015. No active bleeding. Some increased activity in the stomach.   TTE  - Left ventricle: The cavity size was normal. Systolic function washyperdynamic. The estimated ejection fraction was in the range of70% to 75%. There was dynamic obstruction in the mid cavity, witha peak velocity of 200 cm/sec and a peak gradient of 16 mm Hg. Wall motion was normal; there were no regional wall motionabnormalities. There was an increased relative contribution ofatrial contraction to ventricular filling, which may be due tohypovolemia. - Aortic valve: Valve area (VTI): 2.73 cm^2. Valve area (Vmax): 2.05 cm^2.  Valve area (Vmean): 2.14 cm^2. - Right ventricle: Systolic function was hyperdynamic. - Pulmonary arteries: PA peak pressure: 33 mm Hg (S). - Systemic veins: The inferior vena cava is collapsed.  Impressions:  Findings are consistent with hypovolemia    DVT Prophylaxis  :    SCDs   Lab Results  Component Value Date   PLT 121* 06/18/2015    Inpatient Medications  Scheduled Meds: . sodium chloride    Intravenous Once  . allopurinol  100 mg Oral Daily  . calcitRIOL  1.5 mcg Oral Q M,W,F-HD  . citalopram  10 mg Oral Daily  . darbepoetin (ARANESP) injection - DIALYSIS  100 mcg Intravenous Q Fri-HD  . multivitamin  1 tablet Oral QHS  . pantoprazole (PROTONIX) IV  40 mg Intravenous Q12H  . rosuvastatin  20 mg Oral QODAY  . sodium chloride  3 mL Intravenous Q12H   Continuous Infusions:   PRN Meds:.sodium chloride, sodium chloride, acetaminophen **OR** acetaminophen, alteplase, diphenhydrAMINE, feeding supplement (NEPRO CARB STEADY), heparin, lidocaine (PF), lidocaine-prilocaine, ondansetron **OR** ondansetron (ZOFRAN) IV, pentafluoroprop-tetrafluoroeth  Antibiotics  :    Anti-infectives    None        Objective:   Filed Vitals:   06/19/15 1703 06/19/15 2117 06/20/15 0547 06/20/15 0806  BP: 143/59 120/54 125/60 129/52  Pulse: 99 97 92 104  Temp: 98 F (36.7 C) 98.2 F (36.8 C) 98.1 F (36.7 C) 98.1 F (36.7 C)  TempSrc: Oral Oral Oral Oral  Resp: 17 18 17 18   Height:      Weight:  73.4 kg (161 lb 13.1 oz)    SpO2: 100% 100% 98% 100%    Wt Readings from Last 3 Encounters:  06/19/15 73.4 kg (161 lb 13.1 oz)  05/06/15 69.4 kg (153 lb)  04/13/15 69.854 kg (154 lb)     Intake/Output Summary (Last 24 hours) at 06/20/15 1101 Last data filed at 06/20/15 1045  Gross per 24 hour  Intake    720 ml  Output    250 ml  Net    470 ml   Physical Exam  Awake Alert, Oriented X 3, No new F.N deficits, Normal affect [place].AT,PERRAL Supple Neck,No JVD, No cervical lymphadenopathy appriciated.  Symmetrical Chest wall movement, Good air movement bilaterally, CTAB RRR,No Gallops,Rubs or new Murmurs, No Parasternal Heave +ve B.Sounds, Abd Soft, No tenderness, No organomegaly appriciated, No rebound - guarding or rigidity. No Cyanosis, Clubbing or edema, No new Rash or bruise       Data Review:   Micro Results No results found for this or any previous visit (from the past 240  hour(s)).  Radiology Reports Mr Brain Wo Contrast  06/15/2015   CLINICAL DATA:  Syncopal episode while straining for a bowel movement. Rectal bleeding.  EXAM: MRI HEAD WITHOUT CONTRAST  TECHNIQUE: Multiplanar, multiecho pulse sequences of the brain and surrounding structures were obtained without intravenous contrast.  COMPARISON:  MRI brain 06/26/2011.  FINDINGS: The diffusion-weighted images demonstrate no evidence for acute or subacute infarction. Moderate periventricular and subcortical T2 changes are similar to the prior study. Moderate generalized atrophy is again noted. The ventricles are proportionate to the degree of atrophy.  No significant extra-axial fluid collection is present. Flow is present in the major intracranial arteries. The globes and orbits are intact. Mild mucosal thickening is present in the right frontal sinus and anterior ethmoid air cells. The mastoid air cells are clear. The paranasal sinuses are otherwise clear.  The skullbase is within normal limits. Midline intracranial  contents are normal.  Moderate spondylosis is present in the upper cervical spine grade 1 retrolisthesis is present at C3-4 with progressive endplate degenerative change. There is chronic loss of disc height at C4-5 and C5-6.  IMPRESSION: 1. No acute intracranial abnormality. 2. Moderate atrophy and white matter disease is similar to the prior study. This likely reflects the sequela of chronic microvascular ischemia. 3. Moderate spondylosis in the upper cervical spine as described.   Electronically Signed   By: San Morelle M.D.   On: 06/15/2015 09:55   Nm Gi Blood Loss  06/16/2015   CLINICAL DATA:  Gastrointestinal bleeding earlier today  EXAM: NUCLEAR MEDICINE GASTROINTESTINAL BLEEDING SCAN  TECHNIQUE: Sequential abdominal and pelvic images were obtained following intravenous administration of Tc-61m labeled red blood cells for a 2 hr time span.  RADIOPHARMACEUTICALS:  24.4 mCi Tc-50m in-vitro labeled  red cells.  COMPARISON:  None.  FINDINGS: There is gastric uptake of radiotracer which is not progressed during this study. There is no abnormal radiotracer uptake in the small or large bowel regions.  IMPRESSION: Increased radiotracer uptake in the stomach raises concern for gastritis. No small bowel or large bowel acute gastrointestinal bleeding focus is identified on this study.   Electronically Signed   By: Lowella Grip III M.D.   On: 06/16/2015 21:09     CBC  Recent Labs Lab 06/15/15 0414 06/15/15 0810  06/16/15 0830  06/18/15 0343 06/18/15 0820 06/19/15 0925 06/20/15 0445 06/20/15 0850  WBC 6.3 6.8  --  5.3  --  6.0 5.8  --   --   --   HGB 9.7* 10.3*  < > 8.2*  < > 7.4* 7.5* 9.5* 7.7* 8.2*  HCT 29.7* 31.1*  < > 24.6*  < > 22.9* 22.9* 28.7* 23.2* 24.7*  PLT 157 161  --  156  --  128* 121*  --   --   --   MCV 96.1 97.5  --  94.6  --  93.9 93.9  --   --   --   MCH 31.4 32.3  --  31.5  --  30.3 30.7  --   --   --   MCHC 32.7 33.1  --  33.3  --  32.3 32.8  --   --   --   RDW 15.6* 15.4  --  15.5  --  16.5* 16.2*  --   --   --   LYMPHSABS  --   --   --  1.3  --   --   --   --   --   --   MONOABS  --   --   --  0.3  --   --   --   --   --   --   EOSABS  --   --   --  0.1  --   --   --   --   --   --   BASOSABS  --   --   --  0.0  --   --   --   --   --   --   < > = values in this interval not displayed.  Chemistries   Recent Labs Lab 06/14/15 2356 06/15/15 0414 06/16/15 0830 06/18/15 0820  NA 137 135 136 136  K 4.3 4.6 4.7 4.6  CL 94* 95* 99* 98*  CO2 32 29 25 27   GLUCOSE 129* 98 92 90  BUN 26* 33* 54* 39*  CREATININE  6.43* 6.65* 8.72* 7.90*  CALCIUM 8.9 8.7* 9.1 8.8*  AST  --  15  --   --   ALT  --  11*  --   --   ALKPHOS  --  62  --   --   BILITOT  --  0.4  --   --    ------------------------------------------------------------------------------------------------------------------ estimated creatinine clearance is 7.1 mL/min (by C-G formula based on Cr of  7.9). ------------------------------------------------------------------------------------------------------------------ No results for input(s): HGBA1C in the last 72 hours. ------------------------------------------------------------------------------------------------------------------ No results for input(s): CHOL, HDL, LDLCALC, TRIG, CHOLHDL, LDLDIRECT in the last 72 hours. ------------------------------------------------------------------------------------------------------------------ No results for input(s): TSH, T4TOTAL, T3FREE, THYROIDAB in the last 72 hours.  Invalid input(s): FREET3 ------------------------------------------------------------------------------------------------------------------ No results for input(s): VITAMINB12, FOLATE, FERRITIN, TIBC, IRON, RETICCTPCT in the last 72 hours.  Coagulation profile No results for input(s): INR, PROTIME in the last 168 hours.  No results for input(s): DDIMER in the last 72 hours.  Cardiac Enzymes  Recent Labs Lab 06/15/15 1052 06/15/15 1930 06/16/15 0045  TROPONINI 0.05* 0.05* 0.05*   ------------------------------------------------------------------------------------------------------------------ Invalid input(s): POCBNP   Time Spent in minutes   35   SINGH,PRASHANT K M.D on 06/20/2015 at 11:01 AM  Between 7am to 7pm - Pager - [phone]  After 7pm go to [email or web address] - password [facility]  [facility] -  Office  [phone]

## 2015-06-20 NOTE — Progress Notes (Signed)
[facility] Progress Note  Assessment/Plan: 1. GIB- prob tic bleed, s/p colon/ EGD 06/17/15. Resolving diverticular hemorrhage per GI. 2u prbc's 06/18/15. Hgb 9.5 yesterday. Now down to 7.7. Patient asymptomatic. Denies melena, hemoptysis, any visible bleeding of any type. GI/Primary following.  2. Syncope- MRI- no acute abnormality. ECHO- symptoms co/nsistent with hypovolumia and EEG negative 3. ESRD - [facility]. No hep. For HD tomorrow.  4. Hypertension/volume - controlled. No BP meds. HD Yesterday Net UF 500. Post wt 70 kg. BP stable. Wt today 73.4 kg. Will attempt 2-2.5 liters as tolerated tomorrow. 5. Anemia - hgb 7.7. Will follow HGB will probably need transfusion in HD tomorrow. On ESA.  6. Metabolic bone disease - Ca 8.8 C Ca 9.5 phos 4.0. Cont calcitriol. Not on binders. 7. Nutrition - renal diet/renal vit. Alb 3.1 Dietary consult for protein malnutrition  Rita H. Brown NP-C 06/20/2015, 8:42 AM  [facility] [phone]  Pt seen, examined and agree w A/P as above.  Kelly Splinter MD pager [phone]    cell [phone] 06/20/2015, 3:49 PM    Subjective: "I need to get my teeth into my mouth".  Denies abdominal pain, tarry stools, states last BM was  Yesterday. Sitting up in bed attempting to eat breakfast.   Objective Filed Vitals:   06/19/15 1703 06/19/15 2117 06/20/15 0547 06/20/15 0806  BP: 143/59 120/54 125/60 129/52  Pulse: 99 97 92 104  Temp: 98 F (36.7 C) 98.2 F (36.8 C) 98.1 F (36.7 C) 98.1 F (36.7 C)  TempSrc: Oral Oral Oral Oral  Resp: 17 18 17 18   Height:      Weight:  73.4 kg (161 lb 13.1 oz)    SpO2: 100% 100% 98% 100%    Physical Exam General: Alert, pleasant AA male, NAD Heart: S1, S2 RRR Lungs: Bilateral breath sounds CTA Abdomen: Abdomen soft, nontender, active BS X 4 Extremities: No LE edema Dialysis Access: LUA AVF + thrill/+ bruit  MWF [facility] 3hrs 30 mins 400/800 68.5kgs 2K/2CA 5400u  heparin L AVG Mircera 50 q 5 weeks- last given on 7/27 Calcitriol 1.5 q hd Additional Objective Labs: Basic Metabolic Panel:  Recent Labs Lab 06/15/15 0414 06/16/15 0830 06/18/15 0820  NA 135 136 136  K 4.6 4.7 4.6  CL 95* 99* 98*  CO2 29 25 27   GLUCOSE 98 92 90  BUN 33* 54* 39*  CREATININE 6.65* 8.72* 7.90*  CALCIUM 8.7* 9.1 8.8*  PHOS  --  3.1 4.0   Liver Function Tests:  Recent Labs Lab 06/15/15 0414 06/16/15 0830 06/18/15 0820  AST 15  --   --   ALT 11*  --   --   ALKPHOS 62  --   --   BILITOT 0.4  --   --   PROT 5.8*  --   --   ALBUMIN 3.4* 3.3* 3.1*   No results for input(s): LIPASE, AMYLASE in the last 168 hours. CBC:  Recent Labs Lab 06/15/15 0414 06/15/15 0810  06/16/15 0830  06/18/15 0343 06/18/15 0820 06/19/15 0925 06/20/15 0445  WBC 6.3 6.8  --  5.3  --  6.0 5.8  --   --   NEUTROABS  --   --   --  3.5  --   --   --   --   --   HGB 9.7* 10.3*  < > 8.2*  < > 7.4* 7.5* 9.5* 7.7*  HCT 29.7* 31.1*  < > 24.6*  < > 22.9* 22.9* 28.7*  23.2*  MCV 96.1 97.5  --  94.6  --  93.9 93.9  --   --   PLT 157 161  --  156  --  128* 121*  --   --   < > = values in this interval not displayed. Blood Culture No results found for: SDES, SPECREQUEST, CULT, REPTSTATUS  Cardiac Enzymes:  Recent Labs Lab 06/15/15 1052 06/15/15 1930 06/16/15 0045  TROPONINI 0.05* 0.05* 0.05*   CBG:  Recent Labs Lab 06/18/15 2350 06/19/15 0720 06/19/15 1216 06/19/15 1804 06/20/15 0545  GLUCAP 95 95 104* 113* 92   Iron Studies: No results for input(s): IRON, TIBC, TRANSFERRIN, FERRITIN in the last 72 hours. @lablastinr3 @ Studies/Results: No results found. Medications:   . sodium chloride   Intravenous Once  . allopurinol  100 mg Oral Daily  . calcitRIOL  1.5 mcg Oral Q M,W,F-HD  . citalopram  10 mg Oral Daily  . darbepoetin (ARANESP) injection - DIALYSIS  100 mcg Intravenous Q Fri-HD  . multivitamin  1 tablet Oral QHS  . pantoprazole (PROTONIX) IV  40 mg  Intravenous Q12H  . rosuvastatin  20 mg Oral QODAY  . sodium chloride  3 mL Intravenous Q12H

## 2015-06-21 ENCOUNTER — Encounter: Payer: Self-pay | Admitting: Internal Medicine

## 2015-06-21 LAB — GLUCOSE, CAPILLARY
GLUCOSE-CAPILLARY: 86 mg/dL (ref 65–99)
GLUCOSE-CAPILLARY: 56 mg/dL — AB (ref 65–99)
GLUCOSE-CAPILLARY: 98 mg/dL (ref 65–99)
Glucose-Capillary: 116 mg/dL — ABNORMAL HIGH (ref 65–99)
Glucose-Capillary: 91 mg/dL (ref 65–99)
Glucose-Capillary: 95 mg/dL (ref 65–99)

## 2015-06-21 LAB — HEMOGLOBIN AND HEMATOCRIT, BLOOD
HCT: 24.1 % — ABNORMAL LOW (ref 39.0–52.0)
Hemoglobin: 8 g/dL — ABNORMAL LOW (ref 13.0–17.0)

## 2015-06-21 MED ORDER — LIDOCAINE-PRILOCAINE 2.5-2.5 % EX CREA
1.0000 "application " | TOPICAL_CREAM | CUTANEOUS | Status: DC | PRN
  Filled 2015-06-21: qty 5

## 2015-06-21 MED ORDER — ALTEPLASE 2 MG IJ SOLR
2.0000 mg | Freq: Once | INTRAMUSCULAR | Status: DC | PRN
  Filled 2015-06-21: qty 2

## 2015-06-21 MED ORDER — CALCITRIOL 0.5 MCG PO CAPS
ORAL_CAPSULE | ORAL | Status: AC
  Filled 2015-06-21: qty 3

## 2015-06-21 MED ORDER — LIDOCAINE HCL (PF) 1 % IJ SOLN: 5.0000 mL | INTRAMUSCULAR | Status: DC | PRN

## 2015-06-21 MED ORDER — NEPRO/CARBSTEADY PO LIQD
237.0000 mL | ORAL | Status: DC | PRN
  Filled 2015-06-21: qty 237

## 2015-06-21 MED ORDER — HEPARIN SODIUM (PORCINE) 1000 UNIT/ML DIALYSIS: 1000.0000 [IU] | INTRAMUSCULAR | Status: DC | PRN

## 2015-06-21 MED ORDER — SODIUM CHLORIDE 0.9 % IV SOLN: 100.0000 mL | INTRAVENOUS | Status: DC | PRN

## 2015-06-21 MED ORDER — PENTAFLUOROPROP-TETRAFLUOROETH EX AERO: 1.0000 "application " | INHALATION_SPRAY | CUTANEOUS | Status: DC | PRN

## 2015-06-21 NOTE — Progress Notes (Signed)
Patient Demographics:    [name], is a 81 y.o. male, DOB - [birth year], [id number]  Admit date - 06/14/2015   Admitting Physician [name], MD  Outpatient Primary MD for the patient is [name], MD  LOS - 6   Chief Complaint  Patient presents with  . Loss of Consciousness  . Fall  . GI Bleeding        Subjective:    [name] has, No headache, No chest pain, No abdominal pain - No Nausea, No new weakness tingling or numbness, No Cough - SOB.  Minimal blood in stool yesterday.   Assessment  & Plan :     1. Syncope with fecal incontinence which was bloody. Was suspicious for seizure activity, however EEG unremarkable and later showed evidence of orthostasis, stable MRI brain and, TTE & EEG, was orthostatic, has been hydrated, will increase activity and monitor orthostatic blood pressures, apply TED stockings. Continue monitoring on telemetry. Monitor H&H.   Question if he became orthostatic on the day of initial presentation as a result of excessive fluid removal during his dialysis run along with LGI bleed.    2. LGI Bleed x 3 . Seen by GI and underwent flexible sigmoidoscopy on 06/15/2015 followed by EGD and colonoscopy on 06/18/2015, results were mild nonerosive gastritis along with diverticular disease, patient most likely has had painless diverticular lower GI bleed. Tagged RBC scan shows mildly increased activity in the stomach but no signs of ongoing bleeding. Continue PPI. He has so far received 1 unit of packed RBC transfusion on 06/16/2015 after a large bright red blood per rectum, will get another 2 units with dialysis on 06/18/2015. Had minimal blood yesterday and his stool. Continue to monitor H&H.  If no bleeding for 24 hours and H&H remains stable will be  discharged to home in the morning.   3. ESRD, Monday Wednesday Friday schedule renal on board.   4. Dyslipidemia. Home dose statin resumed.   5. Essential hypertension. Blood pressure stable hold off blood pressure medications and monitor orthostatics.   6. Mild elevation in troponin. Nonspecific and a ESRD patient, troponin flat lined at 0.05, non-ACS pattern. EKG is nonacute. Chest pain-free. Echocardiogram stable discussed with reading physician Dr. Sallyanne Kuster. Echo essentially consistent with hypovolemia.    Code Status : Full  Family Communication  : wife  Disposition Plan  : Home 1-2 days  Consults  :  Neuro, renal  Procedures  :    MRI Brain - no acute changes  EEG - no seizure-like activity noted  Flexible sigmoidoscopy by Dr. Silvano Rusk on 06/15/2015. Showing diverticulosis and some blood in the colon.  EGD and Colonoscopy. EGD showing mild nonerosive gastritis, colonoscopy diverticular disease. No signs of active bleeding.  Tagged RBC scan 06/16/2015. No active bleeding. Some increased activity in the stomach.   TTE  - Left ventricle: The cavity size was normal. Systolic function washyperdynamic. The estimated ejection fraction was in the range of70% to 75%. There was dynamic obstruction in the mid cavity, witha peak velocity of 200 cm/sec and a peak gradient of 16 mm Hg. Wall motion was normal; there were no regional wall motionabnormalities. There was an increased relative contribution ofatrial contraction to ventricular filling, which  may be due tohypovolemia. - Aortic valve: Valve area (VTI): 2.73 cm^2. Valve area (Vmax): 2.05 cm^2. Valve area (Vmean): 2.14 cm^2. - Right ventricle: Systolic function was hyperdynamic. - Pulmonary arteries: PA peak pressure: 33 mm Hg (S). - Systemic veins: The inferior vena cava is collapsed.  Impressions:  Findings are consistent with hypovolemia    DVT Prophylaxis  :    SCDs   Lab Results  Component Value  Date   PLT 121* 06/18/2015    Inpatient Medications  Scheduled Meds: . sodium chloride   Intravenous Once  . allopurinol  100 mg Oral Daily  . calcitRIOL  1.5 mcg Oral Q M,W,F-HD  . citalopram  10 mg Oral Daily  . darbepoetin (ARANESP) injection - DIALYSIS  100 mcg Intravenous Q Fri-HD  . metoprolol tartrate  25 mg Oral BID  . multivitamin  1 tablet Oral QHS  . pantoprazole (PROTONIX) IV  40 mg Intravenous Q12H  . rosuvastatin  20 mg Oral QODAY  . sodium chloride  3 mL Intravenous Q12H   Continuous Infusions:   PRN Meds:.sodium chloride, sodium chloride, acetaminophen **OR** acetaminophen, alteplase, diphenhydrAMINE, feeding supplement (NEPRO CARB STEADY), heparin, lidocaine (PF), lidocaine-prilocaine, ondansetron **OR** ondansetron (ZOFRAN) IV, pentafluoroprop-tetrafluoroeth  Antibiotics  :    Anti-infectives    None        Objective:   Filed Vitals:   06/20/15 1625 06/20/15 2141 06/21/15 0612 06/21/15 0815  BP: 139/64 124/56 128/58 127/52  Pulse: 74 80 80 85  Temp: 98 F (36.7 C) 98.1 F (36.7 C) 98.1 F (36.7 C) 97.9 F (36.6 C)  TempSrc: Oral Oral Oral Oral  Resp: 17 18 17 17   Height:      Weight:  74.3 kg (163 lb 12.8 oz)    SpO2: 92% 100% 99% 99%    Wt Readings from Last 3 Encounters:  06/20/15 74.3 kg (163 lb 12.8 oz)  05/06/15 69.4 kg (153 lb)  04/13/15 69.854 kg (154 lb)     Intake/Output Summary (Last 24 hours) at 06/21/15 1243 Last data filed at 06/21/15 1032  Gross per 24 hour  Intake    960 ml  Output    500 ml  Net    460 ml   Physical Exam  Awake Alert, Oriented X 3, No new F.N deficits, Normal affect [place].AT,PERRAL Supple Neck,No JVD, No cervical lymphadenopathy appriciated.  Symmetrical Chest wall movement, Good air movement bilaterally, CTAB RRR,No Gallops,Rubs or new Murmurs, No Parasternal Heave +ve B.Sounds, Abd Soft, No tenderness, No organomegaly appriciated, No rebound - guarding or rigidity. No Cyanosis, Clubbing or edema, No  new Rash or bruise       Data Review:   Micro Results No results found for this or any previous visit (from the past 240 hour(s)).  Radiology Reports Mr Brain Wo Contrast  06/15/2015   CLINICAL DATA:  Syncopal episode while straining for a bowel movement. Rectal bleeding.  EXAM: MRI HEAD WITHOUT CONTRAST  TECHNIQUE: Multiplanar, multiecho pulse sequences of the brain and surrounding structures were obtained without intravenous contrast.  COMPARISON:  MRI brain 06/26/2011.  FINDINGS: The diffusion-weighted images demonstrate no evidence for acute or subacute infarction. Moderate periventricular and subcortical T2 changes are similar to the prior study. Moderate generalized atrophy is again noted. The ventricles are proportionate to the degree of atrophy.  No significant extra-axial fluid collection is present. Flow is present in the major intracranial arteries. The globes and orbits are intact. Mild mucosal thickening is present in the right frontal sinus  and anterior ethmoid air cells. The mastoid air cells are clear. The paranasal sinuses are otherwise clear.  The skullbase is within normal limits. Midline intracranial contents are normal.  Moderate spondylosis is present in the upper cervical spine grade 1 retrolisthesis is present at C3-4 with progressive endplate degenerative change. There is chronic loss of disc height at C4-5 and C5-6.  IMPRESSION: 1. No acute intracranial abnormality. 2. Moderate atrophy and white matter disease is similar to the prior study. This likely reflects the sequela of chronic microvascular ischemia. 3. Moderate spondylosis in the upper cervical spine as described.   Electronically Signed   By: San Morelle M.D.   On: 06/15/2015 09:55   Nm Gi Blood Loss  06/16/2015   CLINICAL DATA:  Gastrointestinal bleeding earlier Wise  EXAM: NUCLEAR MEDICINE GASTROINTESTINAL BLEEDING SCAN  TECHNIQUE: Sequential abdominal and pelvic images were obtained following intravenous  administration of Tc-31m labeled red blood cells for a 2 hr time span.  RADIOPHARMACEUTICALS:  24.4 mCi Tc-94m in-vitro labeled red cells.  COMPARISON:  None.  FINDINGS: There is gastric uptake of radiotracer which is not progressed during this study. There is no abnormal radiotracer uptake in the small or large bowel regions.  IMPRESSION: Increased radiotracer uptake in the stomach raises concern for gastritis. No small bowel or large bowel acute gastrointestinal bleeding focus is identified on this study.   Electronically Signed   By: Lowella Grip III M.D.   On: 06/16/2015 21:09     CBC  Recent Labs Lab 06/15/15 0414 06/15/15 0810  06/16/15 0830  06/18/15 0343 06/18/15 0820 06/19/15 0925 06/20/15 0445 06/20/15 0850 06/21/15 0446  WBC 6.3 6.8  --  5.3  --  6.0 5.8  --   --   --   --   HGB 9.7* 10.3*  < > 8.2*  < > 7.4* 7.5* 9.5* 7.7* 8.2* 8.0*  HCT 29.7* 31.1*  < > 24.6*  < > 22.9* 22.9* 28.7* 23.2* 24.7* 24.1*  PLT 157 161  --  156  --  128* 121*  --   --   --   --   MCV 96.1 97.5  --  94.6  --  93.9 93.9  --   --   --   --   MCH 31.4 32.3  --  31.5  --  30.3 30.7  --   --   --   --   MCHC 32.7 33.1  --  33.3  --  32.3 32.8  --   --   --   --   RDW 15.6* 15.4  --  15.5  --  16.5* 16.2*  --   --   --   --   LYMPHSABS  --   --   --  1.3  --   --   --   --   --   --   --   MONOABS  --   --   --  0.3  --   --   --   --   --   --   --   EOSABS  --   --   --  0.1  --   --   --   --   --   --   --   BASOSABS  --   --   --  0.0  --   --   --   --   --   --   --   < > =  values in this interval not displayed.  Chemistries   Recent Labs Lab 06/14/15 2356 06/15/15 0414 06/16/15 0830 06/18/15 0820  NA 137 135 136 136  K 4.3 4.6 4.7 4.6  CL 94* 95* 99* 98*  CO2 32 29 25 27   GLUCOSE 129* 98 92 90  BUN 26* 33* 54* 39*  CREATININE 6.43* 6.65* 8.72* 7.90*  CALCIUM 8.9 8.7* 9.1 8.8*  AST  --  15  --   --   ALT  --  11*  --   --   ALKPHOS  --  62  --   --   BILITOT  --  0.4  --    --    ------------------------------------------------------------------------------------------------------------------ estimated creatinine clearance is 7.1 mL/min (by C-G formula based on Cr of 7.9). ------------------------------------------------------------------------------------------------------------------ No results for input(s): HGBA1C in the last 72 hours. ------------------------------------------------------------------------------------------------------------------ No results for input(s): CHOL, HDL, LDLCALC, TRIG, CHOLHDL, LDLDIRECT in the last 72 hours. ------------------------------------------------------------------------------------------------------------------ No results for input(s): TSH, T4TOTAL, T3FREE, THYROIDAB in the last 72 hours.  Invalid input(s): FREET3 ------------------------------------------------------------------------------------------------------------------ No results for input(s): VITAMINB12, FOLATE, FERRITIN, TIBC, IRON, RETICCTPCT in the last 72 hours.  Coagulation profile No results for input(s): INR, PROTIME in the last 168 hours.  No results for input(s): DDIMER in the last 72 hours.  Cardiac Enzymes  Recent Labs Lab 06/15/15 1052 06/15/15 1930 06/16/15 0045  TROPONINI 0.05* 0.05* 0.05*   ------------------------------------------------------------------------------------------------------------------ Invalid input(s): POCBNP   Time Spent in minutes   35   Yalexa Blust K M.D on 06/21/2015 at 12:43 PM  Between 7am to 7pm - Pager - [phone]  After 7pm go to [email or web address] - password [facility]  [facility] -  Office  [phone]

## 2015-06-21 NOTE — Progress Notes (Signed)
Subjective:  Eating brk  No co for hd today   Objective Vital signs in last 24 hours: Filed Vitals:   06/20/15 1625 06/20/15 2141 06/21/15 0612 06/21/15 0815  BP: 139/64 124/56 128/58 127/52  Pulse: 74 80 80 85  Temp: 98 F (36.7 C) 98.1 F (36.7 C) 98.1 F (36.7 C) 97.9 F (36.6 C)  TempSrc: Oral Oral Oral Oral  Resp: 17 18 17 17   Height:      Weight:  74.3 kg (163 lb 12.8 oz)    SpO2: 92% 100% 99% 99%   Weight change: 0.9 kg (1 lb 15.8 oz)  Physical Exam: General: Alert, pleasant , NAD Heart: RRR no mur, gallop , rub Lungs: Bilat CTA Abdomen: soft, nontender,BS + Extremities: No pedal edema Dialysis Access: LUA AVF + bruit  MWF [facility] 3hrs 30 mins 400/800 68.5kgs 2K/2CA 5400u heparin L AVG Mircera 50 q 5 weeks- last given on 7/27 Calcitriol 1.5 q hd  Problem/Plan: 1. GIB-  s/p colon/ EGD 06/17/15. Resolving diverticular hemorrhage per GI. 2u prbc's 06/18/15. Hgb 9.5 yesterday7.7 > 8.2  Pending pre hd today . Patient asymptomatic. Denies melena. 2. Syncope-  Related to Hypovolumia with =MRI- no acute abnormality. ECHO- "symptoms cw  hypovolumia" / EEG negative 3. ESRD - [facility]. No hep.HD today .  4. Hypertension/volume - controlled now off  BP meds. . Will attempt 2-2.5 liters as tolerated today "Eating well past 2days". 5. Anemia - hgb 8.2 yest.  Will follow HGB / transfusion in HD as needed esa `100/ increase as OP  6. Metabolic bone disease - Ca 8.8 C Ca 9.5 phos 4.0. Cont calcitriol. Not on binders. 7. Nutrition - renal diet/renal vit. Alb 3.1   Ernest Haber, PA-C [facility] [phone] 06/21/2015,8:43 AM  LOS: 6 days   Labs: Basic Metabolic Panel:  Recent Labs Lab 06/15/15 0414 06/16/15 0830 06/18/15 0820  NA 135 136 136  K 4.6 4.7 4.6  CL 95* 99* 98*  CO2 29 25 27   GLUCOSE 98 92 90  BUN 33* 54* 39*  CREATININE 6.65* 8.72* 7.90*  CALCIUM 8.7* 9.1 8.8*  PHOS  --  3.1 4.0   Liver Function Tests:  Recent  Labs Lab 06/15/15 0414 06/16/15 0830 06/18/15 0820  AST 15  --   --   ALT 11*  --   --   ALKPHOS 62  --   --   BILITOT 0.4  --   --   PROT 5.8*  --   --   ALBUMIN 3.4* 3.3* 3.1*   No results for input(s): LIPASE, AMYLASE in the last 168 hours. No results for input(s): AMMONIA in the last 168 hours. CBC:  Recent Labs Lab 06/15/15 0414 06/15/15 0810  06/16/15 0830  06/18/15 0343 06/18/15 0820  06/20/15 0445 06/20/15 0850 06/21/15 0446  WBC 6.3 6.8  --  5.3  --  6.0 5.8  --   --   --   --   NEUTROABS  --   --   --  3.5  --   --   --   --   --   --   --   HGB 9.7* 10.3*  < > 8.2*  < > 7.4* 7.5*  < > 7.7* 8.2* 8.0*  HCT 29.7* 31.1*  < > 24.6*  < > 22.9* 22.9*  < > 23.2* 24.7* 24.1*  MCV 96.1 97.5  --  94.6  --  93.9 93.9  --   --   --   --  PLT 157 161  --  156  --  128* 121*  --   --   --   --   < > = values in this interval not displayed. Cardiac Enzymes:  Recent Labs Lab 06/15/15 1052 06/15/15 1930 06/16/15 0045  TROPONINI 0.05* 0.05* 0.05*   CBG:  Recent Labs Lab 06/20/15 0545 06/20/15 1128 06/20/15 1924 06/21/15 0008 06/21/15 0609  GLUCAP 92 103* 103* 95 91    Studies/Results: No results found. Medications:   . sodium chloride   Intravenous Once  . allopurinol  100 mg Oral Daily  . calcitRIOL  1.5 mcg Oral Q M,W,F-HD  . citalopram  10 mg Oral Daily  . darbepoetin (ARANESP) injection - DIALYSIS  100 mcg Intravenous Q Fri-HD  . metoprolol tartrate  25 mg Oral BID  . multivitamin  1 tablet Oral QHS  . pantoprazole (PROTONIX) IV  40 mg Intravenous Q12H  . rosuvastatin  20 mg Oral QODAY  . sodium chloride  3 mL Intravenous Q12H

## 2015-06-21 NOTE — Progress Notes (Signed)
Scant bright red blood seen after patient wiped himself. No bowel movement however.

## 2015-06-21 NOTE — Care Management Important Message (Signed)
Important Message  Patient Details  Name: [name] MRN: [id number] Date of Birth: 11-Sep-1929   Medicare Important Message Given:  Yes-third notification given    Delorse Lek 06/21/2015, 3:43 PM

## 2015-06-21 NOTE — Progress Notes (Signed)
Quick Note:  Not a polyp F/u GI prn ______

## 2015-06-21 NOTE — Progress Notes (Signed)
PT Cancellation Note  Patient Details Name: [name] MRN: [id number] DOB: Apr 07, 1929   Cancelled Treatment:    Reason Eval/Treat Not Completed: Patient at procedure or test/unavailable   Currently in HD;   Will follow up later today as time allows;  Otherwise, will follow up for PT tomorrow;   Thank you,  Roney Marion, [place] Pager [phone] Office [phone]      Roney Marion [facility] 06/21/2015, 3:43 PM

## 2015-06-21 NOTE — Procedures (Signed)
Patient seen on Hemodialysis. QB 400, UF goal 2.5L Treatment adjusted as needed.  Elmarie Shiley MD [facility]. Office # [phone] Pager # [phone] 2:52 PM

## 2015-06-22 ENCOUNTER — Encounter: Payer: Self-pay | Admitting: Gastroenterology

## 2015-06-22 ENCOUNTER — Telehealth: Payer: Self-pay | Admitting: *Deleted

## 2015-06-22 LAB — GLUCOSE, CAPILLARY: Glucose-Capillary: 93 mg/dL (ref 65–99)

## 2015-06-22 LAB — HEMOGLOBIN AND HEMATOCRIT, BLOOD
HCT: 23.8 % — ABNORMAL LOW (ref 39.0–52.0)
HEMOGLOBIN: 7.8 g/dL — AB (ref 13.0–17.0)

## 2015-06-22 MED ORDER — DOCUSATE SODIUM 100 MG PO CAPS: 200.0000 mg | ORAL_CAPSULE | Freq: Two times a day (BID) | ORAL | Status: DC

## 2015-06-22 MED ORDER — ASPIRIN 81 MG PO TABS: 81.0000 mg | ORAL_TABLET | Freq: Every day | ORAL | Status: DC

## 2015-06-22 NOTE — Discharge Instructions (Signed)
Follow with Primary MD [name], MD in 7 days   Get CBC, CMP, 2 view Chest X ray checked  by Primary MD next visit.    Activity: As tolerated with Full fall precautions use walker/cane & assistance as needed   Disposition Home     Diet: Renal.  For Heart failure patients - Check your Weight same time everyday, if you gain over 2 pounds, or you develop in leg swelling, experience more shortness of breath or chest pain, call your Primary MD immediately. Follow Cardiac Low Salt Diet and 1.5 lit/day fluid restriction.   On your next visit with your primary care physician please Get Medicines reviewed and adjusted.   Please request your Prim.MD to go over all Hospital Tests and Procedure/Radiological results at the follow up, please get all Hospital records sent to your Prim MD by signing hospital release before you go home.   If you experience worsening of your admission symptoms, develop shortness of breath, life threatening emergency, suicidal or homicidal thoughts you must seek medical attention immediately by calling 911 or calling your MD immediately  if symptoms less severe.  You Must read complete instructions/literature along with all the possible adverse reactions/side effects for all the Medicines you take and that have been prescribed to you. Take any new Medicines after you have completely understood and accpet all the possible adverse reactions/side effects.   Do not drive, operating heavy machinery, perform activities at heights, swimming or participation in water activities or provide baby sitting services if your were admitted for syncope or siezures until you have seen by Primary MD or a Neurologist and advised to do so again.  Do not drive when taking Pain medications.    Do not take more than prescribed Pain, Sleep and Anxiety Medications  Special Instructions: If you have smoked or chewed Tobacco  in the last 2 yrs please stop smoking, stop any regular Alcohol  and  or any Recreational drug use.  Wear Seat belts while driving.   Please note  You were cared for by a hospitalist during your hospital stay. If you have any questions about your discharge medications or the care you received while you were in the hospital after you are discharged, you can call the unit and asked to speak with the hospitalist on call if the hospitalist that took care of you is not available. Once you are discharged, your primary care physician will handle any further medical issues. Please note that NO REFILLS for any discharge medications will be authorized once you are discharged, as it is imperative that you return to your primary care physician (or establish a relationship with a primary care physician if you do not have one) for your aftercare needs so that they can reassess your need for medications and monitor your lab values.

## 2015-06-22 NOTE — Discharge Summary (Signed)
[name], is a 81 y.o. male  DOB [birth year]  MRN [id number].  Admission date:  06/14/2015  Admitting Physician  [name], MD  Discharge Date:  06/22/2015   Primary MD  [name], MD  Recommendations for primary care physician for things to follow:   CBC next visit within 2-3 days, close outpatient GI follow-up for diverticular bleed   Admission Diagnosis  Hematochezia [K92.1] Syncope, unspecified syncope type [R55]   Discharge Diagnosis  Hematochezia [K92.1] Syncope, unspecified syncope type [R55]     Principal Problem:   Acute GI bleeding Active Problems:   End stage renal disease   Syncope   Acute blood loss anemia   Diverticulosis of colon with hemorrhage   Benign neoplasm of descending colon   Gastritis      Past Medical History  Diagnosis Date  . ANEMIA-IRON DEFICIENCY 03/09/2008  . DIVERTICULOSIS, COLON 03/09/2008  . GOUT 03/09/2008  . HYPERLIPIDEMIA 03/09/2008  . HYPERTENSION 03/09/2008  . PROSTATE CANCER, HX OF 03/09/2008  . ESRD (end stage renal disease) 03/09/2008  . Complex renal cyst 06/19/2011  . Hyperparathyroidism   . Prostate cancer 2002    Completed external beam radiation 2003.per HPI  . Arthritis     cervical spine.   Marland Kitchen History of blood transfusion   . Depression 03/26/2014  . Hemorrhoids 08/2009    internal.   . Radiation cystitis 2010.    Past Surgical History  Procedure Laterality Date  . Tonsillectomy    . Rotator cuff repair right  4/08  . S/p left hip replacement  2007    Dr. Percell Miller ortho  . Joint replacement      HIP  . Av fistula placement Left 01/24/2013    Procedure: INSERTION OF ARTERIOVENOUS (AV) GORE-TEX GRAFT ARM;  Surgeon: Rosetta Posner, MD;  Location: [facility];  Service: Vascular;  Laterality: Left;  . Flexible sigmoidoscopy N/A 06/15/2015    Procedure:  FLEXIBLE SIGMOIDOSCOPY;  Surgeon: Gatha Mayer, MD;  Location: [facility];  Service: Endoscopy;  Laterality: N/A;  . Colonoscopy N/A 06/17/2015    Procedure: COLONOSCOPY;  Surgeon: Gatha Mayer, MD;  Location: [facility];  Service: Endoscopy;  Laterality: N/A;  . Esophagogastroduodenoscopy N/A 06/17/2015    Procedure: ESOPHAGOGASTRODUODENOSCOPY (EGD);  Surgeon: Gatha Mayer, MD;  Location: [facility] ENDOSCOPY;  Service: Endoscopy;  Laterality: N/A;       HPI  from the history and physical done on the day of admission:   [name] is a 81 y.o. male with history of ESRD on hemodialysis on Monday Wednesday and Friday, history of hypertension presently on no antihypertensives, hyperlipidemia was brought to the ER after patient had a brief episode of loss of consciousness. Patient states he was straining to pass his bowels in the morning yesterday. Late in the evening last night patient was walking in his home when patient suddenly passed out. Following which patient had rectal bleeding. Patient denies hitting his head. Patient had a couple more bowel movements which was frankly  bloody. Denies any abdominal pain. Denies any chest pain or shortness of breath or palpitations. In the ER patient was hemodynamically stable and has been admitted for GI bleed. Patient has had a colonoscopy in 2010 which shows internal hemorrhoids. There was also mention of diverticulosis and patient's charts. Patient states he takes Aleve every day for pain     Hospital Course:     1. Syncope with fecal incontinence which was bloody. Was suspicious for seizure activity, however EEG unremarkable and later showed evidence of orthostasis, stable MRI brain and, TTE & EEG, was orthostatic, has been hydrated, will increase activity and monitor orthostatic blood pressures, apply TED stockings. Continue monitoring on telemetry. Monitor H&H.   Question if he became orthostatic on the day of initial presentation as a result of  excessive fluid removal during his dialysis and blood loss from lower GI bleed .    2. LGI Bleed x 3 . Seen by GI and underwent flexible sigmoidoscopy on 06/15/2015 followed by EGD and colonoscopy on 06/18/2015, results were mild nonerosive gastritis along with diverticular disease, patient most likely has had painless diverticular lower GI bleed. Tagged RBC scan shows mildly increased activity in the stomach but no signs of ongoing bleeding. Continue PPI. He has so far received 1 unit of packed RBC transfusion on 06/16/2015 after a large bright red blood per rectum, will get another 2 units with dialysis on 06/18/2015. Had minimal blood day before yesterday in his stool. Continue to monitor H&H.  No evidence of bleeding for 24 hours and H&H remains stable will be discharged to home with close follow-up with PCP for H&H monitoring, stool softeners provided. We'll also follow with GI closely for diverticular bleed.     3. ESRD - Monday Wednesday Friday schedule renal on board.   4. Dyslipidemia. Home dose statin resumed.   5. Essential hypertension. Blood pressure stable hold off blood pressure medications and monitor orthostatics.   6. Mild elevation in troponin. Nonspecific and a ESRD patient, troponin flat lined at 0.05, non-ACS pattern. EKG is nonacute. Chest pain-free. Echocardiogram stable discussed with reading physician Dr. Sallyanne Kuster. Echo essentially consistent with hypovolemia.   Discharge Condition: Stable  Follow UP  Follow-up Information    Follow up with [place].   Why:  Home Health RN and Physical therapist and Occupational Therapist   Contact information:   [address] [place] [address] [phone]       Follow up with Silvano Rusk, MD. Schedule an appointment as soon as possible for a visit in 1 week.   Specialty:  Gastroenterology   Why:  LGI Bleed   Contact information:   [address] [place] [address] [phone]        Follow up with [name], MD. Schedule an appointment as soon as possible for a visit in 3 days.   Specialties:  Internal Medicine, Radiology   Why:  CBC check   Contact information:   [place] [address] [phone]        Consults obtained - GI  Diet and Activity recommendation: See Discharge Instructions below  Discharge Instructions       Discharge Instructions    Discharge instructions    Complete by:  As directed   Follow with Primary MD [name], MD in 7 days   Get CBC, CMP, 2 view Chest X ray checked  by Primary MD next visit.    Activity: As tolerated  with Full fall precautions use walker/cane & assistance as needed   Disposition Home     Diet: Renal.  For Heart failure patients - Check your Weight same time everyday, if you gain over 2 pounds, or you develop in leg swelling, experience more shortness of breath or chest pain, call your Primary MD immediately. Follow Cardiac Low Salt Diet and 1.5 lit/day fluid restriction.   On your next visit with your primary care physician please Get Medicines reviewed and adjusted.   Please request your Prim.MD to go over all Hospital Tests and Procedure/Radiological results at the follow up, please get all Hospital records sent to your Prim MD by signing hospital release before you go home.   If you experience worsening of your admission symptoms, develop shortness of breath, life threatening emergency, suicidal or homicidal thoughts you must seek medical attention immediately by calling 911 or calling your MD immediately  if symptoms less severe.  You Must read complete instructions/literature along with all the possible adverse reactions/side effects for all the Medicines you take and that have been prescribed to you. Take any new Medicines after you have completely understood and accpet all the possible adverse reactions/side effects.   Do not drive, operating heavy machinery, perform  activities at heights, swimming or participation in water activities or provide baby sitting services if your were admitted for syncope or siezures until you have seen by Primary MD or a Neurologist and advised to do so again.  Do not drive when taking Pain medications.    Do not take more than prescribed Pain, Sleep and Anxiety Medications  Special Instructions: If you have smoked or chewed Tobacco  in the last 2 yrs please stop smoking, stop any regular Alcohol  and or any Recreational drug use.  Wear Seat belts while driving.   Please note  You were cared for by a hospitalist during your hospital stay. If you have any questions about your discharge medications or the care you received while you were in the hospital after you are discharged, you can call the unit and asked to speak with the hospitalist on call if the hospitalist that took care of you is not available. Once you are discharged, your primary care physician will handle any further medical issues. Please note that NO REFILLS for any discharge medications will be authorized once you are discharged, as it is imperative that you return to your primary care physician (or establish a relationship with a primary care physician if you do not have one) for your aftercare needs so that they can reassess your need for medications and monitor your lab values.     Increase activity slowly    Complete by:  As directed              Discharge Medications       Medication List    TAKE these medications        allopurinol 100 MG tablet  Commonly known as:  ZYLOPRIM  TAKE 1 TABLET DAILY     aspirin 81 MG tablet  Take 1 tablet (81 mg total) by mouth daily.  Start taking on:  06/28/2015     atenolol 50 MG tablet  Commonly known as:  TENORMIN  TAKE 1/2 TABLET DAILY.     citalopram 10 MG tablet  Commonly known as:  CELEXA  Take 1 tablet (10 mg total) by mouth daily.     docusate sodium 100 MG capsule  Commonly known as:  COLACE  Take 2 capsules (200 mg total) by mouth 2 (two) times daily.     doxercalciferol 0.5 MCG capsule  Commonly known as:  HECTOROL  Take 1.5 mcg by mouth daily.     ONE-A-DAY ENERGY FORMULA PO  Take 1 capsule by mouth daily.     rosuvastatin 20 MG tablet  Commonly known as:  CRESTOR  Take 1 tablet (20 mg total) by mouth at bedtime.     tiZANidine 4 MG tablet  Commonly known as:  ZANAFLEX  Take 1 tablet (4 mg total) by mouth every 6 (six) hours as needed for muscle spasms.        Major procedures and Radiology Reports - PLEASE review detailed and final reports for all details, in brief -   MRI Brain - no acute changes  EEG - no seizure-like activity noted  Flexible sigmoidoscopy by Dr. Silvano Rusk on 06/15/2015. Showing diverticulosis and some blood in the colon.  EGD and Colonoscopy. EGD showing mild nonerosive gastritis, colonoscopy diverticular disease. No signs of active bleeding.  Tagged RBC scan 06/16/2015. No active bleeding. Some increased activity in the stomach.   TTE  - Left ventricle: The cavity size was normal. Systolic function washyperdynamic. The estimated ejection fraction was in the range of70% to 75%. There was dynamic obstruction in the mid cavity, witha peak velocity of 200 cm/sec and a peak gradient of 16 mm Hg. Wall motion was normal; there were no regional wall motionabnormalities. There was an increased relative contribution ofatrial contraction to ventricular filling, which may be due tohypovolemia. - Aortic valve: Valve area (VTI): 2.73 cm^2. Valve area (Vmax): 2.05 cm^2. Valve area (Vmean): 2.14 cm^2. - Right ventricle: Systolic function was hyperdynamic. - Pulmonary arteries: PA peak pressure: 33 mm Hg (S). - Systemic veins: The inferior vena cava is collapsed.   Mr Brain Wo Contrast  06/15/2015   CLINICAL DATA:  Syncopal episode while straining for a bowel movement. Rectal bleeding.  EXAM: MRI HEAD WITHOUT CONTRAST  TECHNIQUE:  Multiplanar, multiecho pulse sequences of the brain and surrounding structures were obtained without intravenous contrast.  COMPARISON:  MRI brain 06/26/2011.  FINDINGS: The diffusion-weighted images demonstrate no evidence for acute or subacute infarction. Moderate periventricular and subcortical T2 changes are similar to the prior study. Moderate generalized atrophy is again noted. The ventricles are proportionate to the degree of atrophy.  No significant extra-axial fluid collection is present. Flow is present in the major intracranial arteries. The globes and orbits are intact. Mild mucosal thickening is present in the right frontal sinus and anterior ethmoid air cells. The mastoid air cells are clear. The paranasal sinuses are otherwise clear.  The skullbase is within normal limits. Midline intracranial contents are normal.  Moderate spondylosis is present in the upper cervical spine grade 1 retrolisthesis is present at C3-4 with progressive endplate degenerative change. There is chronic loss of disc height at C4-5 and C5-6.  IMPRESSION: 1. No acute intracranial abnormality. 2. Moderate atrophy and white matter disease is similar to the prior study. This likely reflects the sequela of chronic microvascular ischemia. 3. Moderate spondylosis in the upper cervical spine as described.   Electronically Signed   By: San Morelle M.D.   On: 06/15/2015 09:55   Nm Gi Blood Loss  06/16/2015   CLINICAL DATA:  Gastrointestinal bleeding earlier today  EXAM: NUCLEAR MEDICINE GASTROINTESTINAL BLEEDING SCAN  TECHNIQUE: Sequential abdominal and pelvic images were obtained following intravenous administration of Tc-73m labeled red blood cells for a 2 hr time  span.  RADIOPHARMACEUTICALS:  24.4 mCi Tc-5m in-vitro labeled red cells.  COMPARISON:  None.  FINDINGS: There is gastric uptake of radiotracer which is not progressed during this study. There is no abnormal radiotracer uptake in the small or large bowel regions.   IMPRESSION: Increased radiotracer uptake in the stomach raises concern for gastritis. No small bowel or large bowel acute gastrointestinal bleeding focus is identified on this study.   Electronically Signed   By: Lowella Grip III M.D.   On: 06/16/2015 21:09    Micro Results      No results found for this or any previous visit (from the past 240 hour(s)).     Today   Subjective    [name] today has no headache,no chest abdominal pain,no new weakness tingling or numbness, feels much better wants to go home today.     Objective   Blood pressure 115/56, pulse 93, temperature 98.4 F (36.9 C), temperature source Oral, resp. rate 16, height 5\' 9"  (1.753 m), weight 66.089 kg (145 lb 11.2 oz), SpO2 98 %.   Intake/Output Summary (Last 24 hours) at 06/22/15 0808 Last data filed at 06/21/15 2300  Gross per 24 hour  Intake    580 ml  Output   2100 ml  Net  -1520 ml    Exam Awake Alert, Oriented x 3, No new F.N deficits, Normal affect [place].AT,PERRAL Supple Neck,No JVD, No cervical lymphadenopathy appriciated.  Symmetrical Chest wall movement, Good air movement bilaterally, CTAB RRR,No Gallops,Rubs or new Murmurs, No Parasternal Heave +ve B.Sounds, Abd Soft, Non tender, No organomegaly appriciated, No rebound -guarding or rigidity. No Cyanosis, Clubbing or edema, No new Rash or bruise   Data Review   CBC w Diff: Lab Results  Component Value Date   WBC 5.8 06/18/2015   HGB 7.8* 06/22/2015   HCT 23.8* 06/22/2015   PLT 121* 06/18/2015   LYMPHOPCT 24 06/16/2015   MONOPCT 6 06/16/2015   EOSPCT 3 06/16/2015   BASOPCT 1 06/16/2015    CMP: Lab Results  Component Value Date   NA 136 06/18/2015   K 4.6 06/18/2015   CL 98* 06/18/2015   CO2 27 06/18/2015   BUN 39* 06/18/2015   CREATININE 7.90* 06/18/2015   PROT 5.8* 06/15/2015   ALBUMIN 3.1* 06/18/2015   BILITOT 0.4 06/15/2015   ALKPHOS 62 06/15/2015   AST 15 06/15/2015   ALT 11* 06/15/2015  .   Total Time in  preparing paper work, data evaluation and todays exam - 35 minutes  Thurnell Lose M.D on 06/22/2015 at 8:09 AM  [facility]   Office  [phone]

## 2015-06-22 NOTE — Telephone Encounter (Signed)
Transition Care Management Follow-up Telephone Call   Date discharged? 06/22/15   How have you been since you were released from the hospital? Pt states he is doing alright   Do you understand why you were in the hospital? YES   Do you understand the discharge instructions? YES   Where were you discharged to? Home   Items Reviewed:  Medications reviewed: YES  Allergies reviewed: YES  Dietary changes reviewed: NO  Referrals reviewed: NO   Functional Questionnaire:   Activities of Daily Living (ADLs):   He states he are independent in the following: ambulation, bathing and hygiene, feeding, continence, grooming, toileting and dressing States he doesn't require assistance   Any transportation issues/concerns?: YES   Any patient concerns? NO   Confirmed importance and date/time of follow-up visits scheduled YES, apt made 06/25/15  Provider Appointment booked with Dr. Jenny Reichmann  Confirmed with patient if condition begins to worsen call PCP or go to the ER.  Patient was given the office number and encouraged to call back with question or concerns.  YES

## 2015-06-22 NOTE — Progress Notes (Signed)
Subjective:  No cos tolerated hd yest./ noted for dc today  Objective Vital signs in last 24 hours: Filed Vitals:   06/21/15 2030 06/21/15 2117 06/22/15 0518 06/22/15 0835  BP: 102/44 103/43 115/56 131/47  Pulse: 90 88 93 90  Temp: 97.7 F (36.5 C)  98.4 F (36.9 C) 97.6 F (36.4 C)  TempSrc:   Oral Oral  Resp: 13  16 16   Height:      Weight: 66.089 kg (145 lb 11.2 oz)     SpO2: 100%  98% 98%   Weight change: -2.9 kg (-6 lb 6.3 oz) Physical Exam: General: Alert, pleasant , NAD Heart: RRR no mur, gallop ,or  rub Lungs: Bilat CTA Abdomen: soft, nontender,BS +, no distension  Extremities: No pedal edema Dialysis Access: LUA AVF + bruit  MWF [facility] 3hrs 30 mins 400/800 68.5kgs 2K/2CA 5400u heparin L AVG Mircera 50 q 5 weeks- last given on 7/27 Calcitriol 1.5 q hd  Problem/Plan: 1. GIB- s/p colon/ EGD 06/17/15. Resolving diverticular hemorrhage per GI. 2u prbc's 06/18/15. Hgb 9.5 yesterday 8.0. > 7.8  Am, . Patient asymptomatic.  2. Syncope- Related to Hypovolumia with =MRI- no acute abnormality. ECHO- "symptoms cw hypovolumia" / EEG negative 3. ESRD - MWF [facility]. No hep.HD as op for now.  4. Hypertension/volume - controlled now off most  BP meds metop 25mg   Hold am of hd / I discussed with pt.  5. Anemia - hgb 7.8 . Will follow HGB as op /  esa `100q fri hd herwe / increase as OP  6. Metabolic bone disease - Ca 8.8 C Ca 9.5 phos 4.0. Cont calcitriol. Not on binders. 7. Nutrition - renal diet/renal vit. Alb 3.1  Ernest Haber, PA-C [facility] [phone] 06/22/2015,8:37 AM  LOS: 7 days   Labs: Basic Metabolic Panel:  Recent Labs Lab 06/16/15 0830 06/18/15 0820  NA 136 136  K 4.7 4.6  CL 99* 98*  CO2 25 27  GLUCOSE 92 90  BUN 54* 39*  CREATININE 8.72* 7.90*  CALCIUM 9.1 8.8*  PHOS 3.1 4.0   Liver Function Tests:  Recent Labs Lab 06/16/15 0830 06/18/15 0820  ALBUMIN 3.3* 3.1*    No results for input(s): AMMONIA in  the last 168 hours. CBC:  Recent Labs Lab 06/16/15 0830  06/18/15 0343 06/18/15 0820  06/20/15 0850 06/21/15 0446 06/22/15 0456  WBC 5.3  --  6.0 5.8  --   --   --   --   NEUTROABS 3.5  --   --   --   --   --   --   --   HGB 8.2*  < > 7.4* 7.5*  < > 8.2* 8.0* 7.8*  HCT 24.6*  < > 22.9* 22.9*  < > 24.7* 24.1* 23.8*  MCV 94.6  --  93.9 93.9  --   --   --   --   PLT 156  --  128* 121*  --   --   --   --   < > = values in this interval not displayed. Cardiac Enzymes:  Recent Labs Lab 06/15/15 1052 06/15/15 1930 06/16/15 0045  TROPONINI 0.05* 0.05* 0.05*   CBG:  Recent Labs Lab 06/21/15 1139 06/21/15 1813 06/21/15 1927 06/21/15 2338 06/22/15 0513  GLUCAP 86 56* 98 116* 93    Studies/Results: No results found. Medications:   . sodium chloride   Intravenous Once  . allopurinol  100 mg Oral Daily  . calcitRIOL  1.5  mcg Oral Q M,W,F-HD  . citalopram  10 mg Oral Daily  . darbepoetin (ARANESP) injection - DIALYSIS  100 mcg Intravenous Q Fri-HD  . metoprolol tartrate  25 mg Oral BID  . multivitamin  1 tablet Oral QHS  . pantoprazole (PROTONIX) IV  40 mg Intravenous Q12H  . rosuvastatin  20 mg Oral QODAY  . sodium chloride  3 mL Intravenous Q12H

## 2015-06-23 ENCOUNTER — Telehealth: Payer: Self-pay | Admitting: Internal Medicine

## 2015-06-23 DIAGNOSIS — D631 Anemia in chronic kidney disease: Secondary | ICD-10-CM | POA: Diagnosis not present

## 2015-06-23 DIAGNOSIS — N186 End stage renal disease: Secondary | ICD-10-CM | POA: Diagnosis not present

## 2015-06-23 DIAGNOSIS — N2581 Secondary hyperparathyroidism of renal origin: Secondary | ICD-10-CM | POA: Diagnosis not present

## 2015-06-23 NOTE — Telephone Encounter (Signed)
Patient's wife states that he had a BM today that looked dark.  She is not sure if there was blood in it or not.  She will continue to monitor and if she sees any frank blood she will give [place] a call.

## 2015-06-24 ENCOUNTER — Encounter (HOSPITAL_COMMUNITY): Payer: Medicare Other

## 2015-06-24 ENCOUNTER — Encounter (HOSPITAL_COMMUNITY)
Admission: RE | Admit: 2015-06-24 | Discharge: 2015-06-24 | Disposition: A | Discharge status: Home / Self Care | Payer: Medicare Other | Source: Ambulatory Visit | Attending: Urology | Admitting: Urology

## 2015-06-24 DIAGNOSIS — C61 Malignant neoplasm of prostate: Secondary | ICD-10-CM | POA: Diagnosis present

## 2015-06-24 MED ORDER — TECHNETIUM TC 99M MEDRONATE IV KIT
25.0000 | PACK | Freq: Once | INTRAVENOUS | Status: AC | PRN
  Administered 2015-06-24: 25 via INTRAVENOUS

## 2015-06-25 ENCOUNTER — Inpatient Hospital Stay: Payer: Medicare Other | Admitting: Internal Medicine

## 2015-06-25 ENCOUNTER — Other Ambulatory Visit (INDEPENDENT_AMBULATORY_CARE_PROVIDER_SITE_OTHER): Payer: Medicare Other

## 2015-06-25 ENCOUNTER — Encounter: Payer: Self-pay | Admitting: Internal Medicine

## 2015-06-25 ENCOUNTER — Ambulatory Visit (INDEPENDENT_AMBULATORY_CARE_PROVIDER_SITE_OTHER): Payer: Medicare Other | Admitting: Internal Medicine

## 2015-06-25 VITALS — BP 104/40 | HR 98 | Temp 98.4°F | Ht 68.0 in | Wt 148.0 lb

## 2015-06-25 DIAGNOSIS — D62 Acute posthemorrhagic anemia: Secondary | ICD-10-CM

## 2015-06-25 DIAGNOSIS — N186 End stage renal disease: Secondary | ICD-10-CM | POA: Diagnosis not present

## 2015-06-25 DIAGNOSIS — D631 Anemia in chronic kidney disease: Secondary | ICD-10-CM | POA: Diagnosis not present

## 2015-06-25 DIAGNOSIS — Z96642 Presence of left artificial hip joint: Secondary | ICD-10-CM | POA: Diagnosis not present

## 2015-06-25 DIAGNOSIS — F329 Major depressive disorder, single episode, unspecified: Secondary | ICD-10-CM

## 2015-06-25 DIAGNOSIS — Z87891 Personal history of nicotine dependence: Secondary | ICD-10-CM | POA: Diagnosis not present

## 2015-06-25 DIAGNOSIS — N2581 Secondary hyperparathyroidism of renal origin: Secondary | ICD-10-CM | POA: Diagnosis not present

## 2015-06-25 DIAGNOSIS — I10 Essential (primary) hypertension: Secondary | ICD-10-CM

## 2015-06-25 DIAGNOSIS — I120 Hypertensive chronic kidney disease with stage 5 chronic kidney disease or end stage renal disease: Secondary | ICD-10-CM | POA: Diagnosis not present

## 2015-06-25 DIAGNOSIS — K5731 Diverticulosis of large intestine without perforation or abscess with bleeding: Secondary | ICD-10-CM | POA: Diagnosis not present

## 2015-06-25 DIAGNOSIS — D124 Benign neoplasm of descending colon: Secondary | ICD-10-CM | POA: Diagnosis not present

## 2015-06-25 DIAGNOSIS — Z992 Dependence on renal dialysis: Secondary | ICD-10-CM | POA: Diagnosis not present

## 2015-06-25 DIAGNOSIS — Z8546 Personal history of malignant neoplasm of prostate: Secondary | ICD-10-CM | POA: Diagnosis not present

## 2015-06-25 DIAGNOSIS — F32A Depression, unspecified: Secondary | ICD-10-CM

## 2015-06-25 DIAGNOSIS — K2970 Gastritis, unspecified, without bleeding: Secondary | ICD-10-CM | POA: Diagnosis not present

## 2015-06-25 LAB — CBC WITH DIFFERENTIAL/PLATELET
Basophils Absolute: 0 10*3/uL (ref 0.0–0.1)
Basophils Relative: 0.6 % (ref 0.0–3.0)
EOS ABS: 0.2 10*3/uL (ref 0.0–0.7)
EOS PCT: 2.8 % (ref 0.0–5.0)
HCT: 24.9 % — ABNORMAL LOW (ref 39.0–52.0)
Hemoglobin: 8.2 g/dL — ABNORMAL LOW (ref 13.0–17.0)
LYMPHS ABS: 1.1 10*3/uL (ref 0.7–4.0)
Lymphocytes Relative: 16.3 % (ref 12.0–46.0)
MCHC: 33 g/dL (ref 30.0–36.0)
MCV: 91.6 fl (ref 78.0–100.0)
MONO ABS: 0.9 10*3/uL (ref 0.1–1.0)
Monocytes Relative: 13.5 % — ABNORMAL HIGH (ref 3.0–12.0)
NEUTROS PCT: 66.8 % (ref 43.0–77.0)
Neutro Abs: 4.4 10*3/uL (ref 1.4–7.7)
Platelets: 282 10*3/uL (ref 150.0–400.0)
RDW: 17.8 % — ABNORMAL HIGH (ref 11.5–15.5)
WBC: 6.6 10*3/uL (ref 4.0–10.5)

## 2015-06-25 NOTE — Patient Instructions (Signed)
OK to hold on taking the atenolol 50 gm - 1/2 per day for now  Please continue all other medications as before, and refills have been done if requested.  Please have the pharmacy call with any other refills you may need.  Please keep your appointments with your specialists as you may have planned  Please go to the LAB in the Basement (turn left off the elevator) for the tests to be done today  You will be contacted by phone if any changes need to be made immediately.  Otherwise, you will receive a letter about your results with an explanation, but please check with MyChart first.  Please remember to sign up for MyChart if you have not done so, as this will be important to you in the future with finding out test results, communicating by private email, and scheduling acute appointments online when needed.

## 2015-06-25 NOTE — Progress Notes (Signed)
Subjective:    Patient ID: [name], male    DOB: [birth year], 81 y.o.   MRN: [id number]  HPI  Here to f/u post hospn with GI bleed, fortunately no further BRBPR, and Pt denies chest pain, increased sob or doe, wheezing, orthopnea, PND, increased LE swelling, palpitations, dizziness or syncope, though still has signficant fatigue.  No new falls or syncope.  Pt denies new neurological symptoms such as new headache, or facial or extremity weakness or numbness   Pt denies polydipsia, polyuria.  On HD M-W-F. No fever and Denies urinary symptoms such as dysuria, frequency, urgency, flank pain, hematuria or n/v, fever, chills.  Denies worsening depressive symptoms, suicidal ideation, or panic Past Medical History  Diagnosis Date  . ANEMIA-IRON DEFICIENCY 03/09/2008  . DIVERTICULOSIS, COLON 03/09/2008  . GOUT 03/09/2008  . HYPERLIPIDEMIA 03/09/2008  . HYPERTENSION 03/09/2008  . PROSTATE CANCER, HX OF 03/09/2008  . ESRD (end stage renal disease) 03/09/2008  . Complex renal cyst 06/19/2011  . Hyperparathyroidism   . Prostate cancer 2002    Completed external beam radiation 2003.per HPI  . Arthritis     cervical spine.   Marland Kitchen History of blood transfusion   . Depression 03/26/2014  . Hemorrhoids 08/2009    internal.   . Radiation cystitis 2010.   Past Surgical History  Procedure Laterality Date  . Tonsillectomy    . Rotator cuff repair right  4/08  . S/p left hip replacement  2007    Dr. Percell Miller ortho  . Joint replacement      HIP  . Av fistula placement Left 01/24/2013    Procedure: INSERTION OF ARTERIOVENOUS (AV) GORE-TEX GRAFT ARM;  Surgeon: Rosetta Posner, MD;  Location: [place];  Service: Vascular;  Laterality: Left;  . Flexible sigmoidoscopy N/A 06/15/2015    Procedure: FLEXIBLE SIGMOIDOSCOPY;  Surgeon: Gatha Mayer, MD;  Location: [place];  Service: Endoscopy;  Laterality: N/A;  . Colonoscopy N/A 06/17/2015    Procedure: COLONOSCOPY;  Surgeon: Gatha Mayer, MD;  Location: [place];   Service: Endoscopy;  Laterality: N/A;  . Esophagogastroduodenoscopy N/A 06/17/2015    Procedure: ESOPHAGOGASTRODUODENOSCOPY (EGD);  Surgeon: Gatha Mayer, MD;  Location: [facility] ENDOSCOPY;  Service: Endoscopy;  Laterality: N/A;    reports that he quit smoking about 32 years ago. His smoking use included Cigarettes. He quit after 5 years of use. He has never used smokeless tobacco. He reports that he does not drink alcohol or use illicit drugs. family history includes Cancer in his father; Diabetes in his father; Hypertension in his brother and father. No Known Allergies Current Outpatient Prescriptions on File Prior to Visit  Medication Sig Dispense Refill  . allopurinol (ZYLOPRIM) 100 MG tablet TAKE 1 TABLET DAILY (Patient not taking: Reported on 06/25/2015) 90 tablet 3  . [START ON 06/28/2015] aspirin 81 MG tablet Take 1 tablet (81 mg total) by mouth daily. (Patient not taking: Reported on 06/25/2015) 30 tablet   . atenolol (TENORMIN) 50 MG tablet TAKE 1/2 TABLET DAILY. (Patient not taking: Reported on 06/25/2015) 45 tablet 3  . citalopram (CELEXA) 10 MG tablet Take 1 tablet (10 mg total) by mouth daily. (Patient not taking: Reported on 06/25/2015) 90 tablet 3  . docusate sodium (COLACE) 100 MG capsule Take 2 capsules (200 mg total) by mouth 2 (two) times daily. (Patient not taking: Reported on 06/25/2015) 60 capsule 0  . doxercalciferol (HECTOROL) 0.5 MCG capsule Take 1.5 mcg by mouth daily.     . rosuvastatin (  CRESTOR) 20 MG tablet Take 1 tablet (20 mg total) by mouth at bedtime. (Patient not taking: Reported on 06/25/2015) 90 tablet 3  . Specialty Vitamins Products (ONE-A-DAY ENERGY FORMULA PO) Take 1 capsule by mouth daily.    Marland Kitchen tiZANidine (ZANAFLEX) 4 MG tablet Take 1 tablet (4 mg total) by mouth every 6 (six) hours as needed for muscle spasms. (Patient not taking: Reported on 06/25/2015) 30 tablet 0   No current facility-administered medications on file prior to visit.    Review of Systems   Constitutional: Negative for unusual diaphoresis or night sweats HENT: Negative for ringing in ear or discharge Eyes: Negative for double vision or worsening visual disturbance.  Respiratory: Negative for choking and stridor.   Gastrointestinal: Negative for vomiting or other signifcant bowel change Genitourinary: Negative for hematuria or change in urine volume.  Musculoskeletal: Negative for other MSK pain or swelling Skin: Negative for color change and worsening wound.  Neurological: Negative for tremors and numbness other than noted  Psychiatric/Behavioral: Negative for decreased concentration or agitation other than above       Objective:   Physical Exam BP 104/40 mmHg  Pulse 98  Temp(Src) 98.4 F (36.9 C) (Oral)  Ht 5\' 8"  (1.727 m)  Wt 148 lb (67.132 kg)  BMI 22.51 kg/m2  SpO2 97% VS noted, not ill appaering, but general weakness,  Moves slowly but able to get up on exam table without assist Constitutional: Pt appears in no significant distress HENT: Head: NCAT.  Right Ear: External ear normal.  Left Ear: External ear normal.  Eyes: . Pupils are equal, round, and reactive to light. Conjunctivae and EOM are normal Neck: Normal range of motion. Neck supple.  Cardiovascular: Normal rate and regular rhythm.   Pulmonary/Chest: Effort normal and breath sounds without rales or wheezing.  Abd:  Soft, NT, ND, + BS Neurological: Pt is alert. Not confused , motor grossly intact Skin: Skin is warm. No rash, no LE edema Psychiatric: Pt behavior is normal. No agitation. not depressed appearing     Assessment & Plan:

## 2015-06-25 NOTE — Progress Notes (Signed)
Pre visit review using our clinic review tool, if applicable. No additional management support is needed unless otherwise documented below in the visit note. 

## 2015-06-26 NOTE — Assessment & Plan Note (Signed)
stable overall by history and exam, recent data reviewed with pt, and pt to continue medical treatment as before,  to f/u any worsening symptoms or concerns Lab Results  Component Value Date   WBC 6.6 06/25/2015   HGB 8.2 Repeated and verified X2.* 06/25/2015   HCT 24.9 Repeated and verified X2.* 06/25/2015   PLT 282.0 06/25/2015   GLUCOSE 90 06/18/2015   CHOL 233* 05/06/2015   TRIG 74.0 05/06/2015   HDL 84.80 05/06/2015   LDLCALC 133* 05/06/2015   ALT 11* 06/15/2015   AST 15 06/15/2015   NA 136 06/18/2015   K 4.6 06/18/2015   CL 98* 06/18/2015   CREATININE 7.90* 06/18/2015   BUN 39* 06/18/2015   CO2 27 06/18/2015   TSH 0.58 05/06/2015

## 2015-06-26 NOTE — Assessment & Plan Note (Addendum)
Borderline low o/w stable overall by history and exam, recent data reviewed with pt, and pt to continue medical treatment as before except ok to hold the atenolol 25 qd for now - wife very concerned about another syncope,  to f/u any worsening symptoms or concerns BP Readings from Last 3 Encounters:  06/25/15 104/40  06/22/15 131/47  05/06/15 122/66

## 2015-06-26 NOTE — Assessment & Plan Note (Signed)
With general weakness but no further overt bleeding, for f/u cbc today, consider transfuse for hgb < 7

## 2015-06-28 ENCOUNTER — Telehealth: Payer: Self-pay | Admitting: Internal Medicine

## 2015-06-28 DIAGNOSIS — K2970 Gastritis, unspecified, without bleeding: Secondary | ICD-10-CM | POA: Diagnosis not present

## 2015-06-28 DIAGNOSIS — D62 Acute posthemorrhagic anemia: Secondary | ICD-10-CM | POA: Diagnosis not present

## 2015-06-28 DIAGNOSIS — K5731 Diverticulosis of large intestine without perforation or abscess with bleeding: Secondary | ICD-10-CM | POA: Diagnosis not present

## 2015-06-28 DIAGNOSIS — D631 Anemia in chronic kidney disease: Secondary | ICD-10-CM | POA: Diagnosis not present

## 2015-06-28 DIAGNOSIS — D124 Benign neoplasm of descending colon: Secondary | ICD-10-CM | POA: Diagnosis not present

## 2015-06-28 DIAGNOSIS — I120 Hypertensive chronic kidney disease with stage 5 chronic kidney disease or end stage renal disease: Secondary | ICD-10-CM | POA: Diagnosis not present

## 2015-06-28 DIAGNOSIS — N186 End stage renal disease: Secondary | ICD-10-CM | POA: Diagnosis not present

## 2015-06-28 DIAGNOSIS — N2581 Secondary hyperparathyroidism of renal origin: Secondary | ICD-10-CM | POA: Diagnosis not present

## 2015-06-28 MED ORDER — ALLOPURINOL 100 MG PO TABS: 100.0000 mg | ORAL_TABLET | Freq: Every day | ORAL | Status: DC

## 2015-06-28 NOTE — Telephone Encounter (Signed)
Patient requesting refill for allopurinol (ZYLOPRIM) 100 MG tablet [id number] Pharmacy is [facility]

## 2015-06-28 NOTE — Telephone Encounter (Signed)
Notified pt rx sent to [facility]...[name]

## 2015-06-29 ENCOUNTER — Telehealth: Payer: Self-pay

## 2015-06-29 NOTE — Telephone Encounter (Signed)
Advised spouse of patient's lab result

## 2015-06-30 ENCOUNTER — Emergency Department (HOSPITAL_COMMUNITY)
Admission: EM | Admit: 2015-06-30 | Discharge: 2015-06-30 | Disposition: A | Discharge status: Home / Self Care | Payer: Medicare Other | Attending: Emergency Medicine | Admitting: Emergency Medicine

## 2015-06-30 ENCOUNTER — Encounter (HOSPITAL_COMMUNITY): Payer: Self-pay | Admitting: Neurology

## 2015-06-30 ENCOUNTER — Telehealth: Payer: Self-pay | Admitting: Internal Medicine

## 2015-06-30 DIAGNOSIS — R55 Syncope and collapse: Secondary | ICD-10-CM | POA: Insufficient documentation

## 2015-06-30 DIAGNOSIS — F329 Major depressive disorder, single episode, unspecified: Secondary | ICD-10-CM | POA: Diagnosis not present

## 2015-06-30 DIAGNOSIS — M109 Gout, unspecified: Secondary | ICD-10-CM | POA: Diagnosis not present

## 2015-06-30 DIAGNOSIS — Z8719 Personal history of other diseases of the digestive system: Secondary | ICD-10-CM | POA: Diagnosis not present

## 2015-06-30 DIAGNOSIS — E785 Hyperlipidemia, unspecified: Secondary | ICD-10-CM | POA: Diagnosis not present

## 2015-06-30 DIAGNOSIS — Z79899 Other long term (current) drug therapy: Secondary | ICD-10-CM | POA: Diagnosis not present

## 2015-06-30 DIAGNOSIS — N186 End stage renal disease: Secondary | ICD-10-CM | POA: Diagnosis not present

## 2015-06-30 DIAGNOSIS — E213 Hyperparathyroidism, unspecified: Secondary | ICD-10-CM | POA: Insufficient documentation

## 2015-06-30 DIAGNOSIS — R404 Transient alteration of awareness: Secondary | ICD-10-CM | POA: Diagnosis not present

## 2015-06-30 DIAGNOSIS — Z8546 Personal history of malignant neoplasm of prostate: Secondary | ICD-10-CM | POA: Insufficient documentation

## 2015-06-30 DIAGNOSIS — N2581 Secondary hyperparathyroidism of renal origin: Secondary | ICD-10-CM | POA: Diagnosis not present

## 2015-06-30 DIAGNOSIS — I120 Hypertensive chronic kidney disease with stage 5 chronic kidney disease or end stage renal disease: Secondary | ICD-10-CM | POA: Insufficient documentation

## 2015-06-30 DIAGNOSIS — Z862 Personal history of diseases of the blood and blood-forming organs and certain disorders involving the immune mechanism: Secondary | ICD-10-CM | POA: Diagnosis not present

## 2015-06-30 DIAGNOSIS — Z87891 Personal history of nicotine dependence: Secondary | ICD-10-CM | POA: Diagnosis not present

## 2015-06-30 DIAGNOSIS — M1990 Unspecified osteoarthritis, unspecified site: Secondary | ICD-10-CM | POA: Insufficient documentation

## 2015-06-30 DIAGNOSIS — Z7982 Long term (current) use of aspirin: Secondary | ICD-10-CM | POA: Insufficient documentation

## 2015-06-30 DIAGNOSIS — D631 Anemia in chronic kidney disease: Secondary | ICD-10-CM | POA: Diagnosis not present

## 2015-06-30 LAB — BASIC METABOLIC PANEL
ANION GAP: 13 (ref 5–15)
BUN: 7 mg/dL (ref 6–20)
CO2: 31 mmol/L (ref 22–32)
Calcium: 8.5 mg/dL — ABNORMAL LOW (ref 8.9–10.3)
Chloride: 94 mmol/L — ABNORMAL LOW (ref 101–111)
Creatinine, Ser: 4.04 mg/dL — ABNORMAL HIGH (ref 0.61–1.24)
GFR calc Af Amer: 14 mL/min — ABNORMAL LOW (ref >60)
GFR, EST NON AFRICAN AMERICAN: 12 mL/min — AB (ref >60)
Glucose, Bld: 89 mg/dL (ref 65–99)
POTASSIUM: 3.4 mmol/L — AB (ref 3.5–5.1)
SODIUM: 138 mmol/L (ref 135–145)

## 2015-06-30 LAB — CBC WITH DIFFERENTIAL/PLATELET
BASOS ABS: 0 10*3/uL (ref 0.0–0.1)
Basophils Relative: 1 % (ref 0–1)
EOS ABS: 0.1 10*3/uL (ref 0.0–0.7)
EOS PCT: 2 % (ref 0–5)
HCT: 27.2 % — ABNORMAL LOW (ref 39.0–52.0)
Hemoglobin: 8.7 g/dL — ABNORMAL LOW (ref 13.0–17.0)
LYMPHS PCT: 16 % (ref 12–46)
Lymphs Abs: 1 10*3/uL (ref 0.7–4.0)
MCH: 30.2 pg (ref 26.0–34.0)
MCHC: 32 g/dL (ref 30.0–36.0)
MCV: 94.4 fL (ref 78.0–100.0)
Monocytes Absolute: 0.6 10*3/uL (ref 0.1–1.0)
Monocytes Relative: 10 % (ref 3–12)
NEUTROS PCT: 71 % (ref 43–77)
Neutro Abs: 4.5 10*3/uL (ref 1.7–7.7)
PLATELETS: 251 10*3/uL (ref 150–400)
RBC: 2.88 MIL/uL — AB (ref 4.22–5.81)
RDW: 16.1 % — ABNORMAL HIGH (ref 11.5–15.5)
WBC: 6.3 10*3/uL (ref 4.0–10.5)

## 2015-06-30 MED ORDER — SODIUM CHLORIDE 0.9 % IV BOLUS (SEPSIS)
500.0000 mL | Freq: Once | INTRAVENOUS | Status: AC
  Administered 2015-06-30: 500 mL via INTRAVENOUS

## 2015-06-30 NOTE — ED Provider Notes (Signed)
CSN: [id number]     Arrival date & time 06/30/15  1114 History   First MD Initiated Contact with Patient 06/30/15 1120     Chief Complaint  Patient presents with  . Loss of Consciousness     (Consider location/radiation/quality/duration/timing/severity/associated sxs/prior Treatment) Patient is a 81 y.o. male presenting with syncope. The history is provided by the patient.  Loss of Consciousness Episode history:  Single Most recent episode:  Today Duration:  2 seconds Timing:  Rare Progression:  Worsening Chronicity:  Recurrent Context: standing up   Context comment:  Post dialysis Witnessed: yes   Relieved by:  Sitting up Worsened by:  Nothing tried Ineffective treatments:  None tried Associated symptoms: no chest pain, no confusion, no fever, no headaches, no palpitations, no shortness of breath and no vomiting   Risk factors: no congenital heart disease and no coronary artery disease    81 yo M with a chief complaint of syncope. Patient states he was standing upright after dialysis felt really lightheaded and dizzy but passed out. Except maybe they slid up too fast. Patient denies any chest pain shortness breath with this episode. Now has no current complaints. States he did pass out previously and was secondary to an acute GI bleed. At that time he said he was having some significant bright red blood in his stool. Patient states he has had dark stools consistently since that event which was about 2 weeks ago.  Past Medical History  Diagnosis Date  . ANEMIA-IRON DEFICIENCY 03/09/2008  . DIVERTICULOSIS, COLON 03/09/2008  . GOUT 03/09/2008  . HYPERLIPIDEMIA 03/09/2008  . HYPERTENSION 03/09/2008  . PROSTATE CANCER, HX OF 03/09/2008  . ESRD (end stage renal disease) 03/09/2008  . Complex renal cyst 06/19/2011  . Hyperparathyroidism   . Prostate cancer 2002    Completed external beam radiation 2003.per HPI  . Arthritis     cervical spine.   Marland Kitchen History of blood transfusion   . Depression  03/26/2014  . Hemorrhoids 08/2009    internal.   . Radiation cystitis 2010.   Past Surgical History  Procedure Laterality Date  . Tonsillectomy    . Rotator cuff repair right  4/08  . S/p left hip replacement  2007    Dr. Percell Miller ortho  . Joint replacement      HIP  . Av fistula placement Left 01/24/2013    Procedure: INSERTION OF ARTERIOVENOUS (AV) GORE-TEX GRAFT ARM;  Surgeon: Rosetta Posner, MD;  Location: [place];  Service: Vascular;  Laterality: Left;  . Flexible sigmoidoscopy N/A 06/15/2015    Procedure: FLEXIBLE SIGMOIDOSCOPY;  Surgeon: Gatha Mayer, MD;  Location: [place];  Service: Endoscopy;  Laterality: N/A;  . Colonoscopy N/A 06/17/2015    Procedure: COLONOSCOPY;  Surgeon: Gatha Mayer, MD;  Location: [place];  Service: Endoscopy;  Laterality: N/A;  . Esophagogastroduodenoscopy N/A 06/17/2015    Procedure: ESOPHAGOGASTRODUODENOSCOPY (EGD);  Surgeon: Gatha Mayer, MD;  Location: [facility] ENDOSCOPY;  Service: Endoscopy;  Laterality: N/A;   Family History  Problem Relation Age of Onset  . Hypertension Father   . Diabetes Father   . Cancer Father     prostate cancer  . Hypertension Brother    Social History  Substance Use Topics  . Smoking status: Former Smoker -- 5 years    Types: Cigarettes    Quit date: 09/17/1982  . Smokeless tobacco: Never Used  . Alcohol Use: No     Comment: rare    Review of Systems  Constitutional: Negative for fever and chills.  HENT: Negative for congestion and facial swelling.   Eyes: Negative for discharge and visual disturbance.  Respiratory: Negative for shortness of breath.   Cardiovascular: Positive for syncope. Negative for chest pain and palpitations.  Gastrointestinal: Negative for vomiting, abdominal pain and diarrhea.  Musculoskeletal: Negative for myalgias and arthralgias.  Skin: Negative for color change and rash.  Neurological: Positive for syncope. Negative for tremors and headaches.  Psychiatric/Behavioral: Negative  for confusion and dysphoric mood.      Allergies  Review of patient's allergies indicates no known allergies.  Home Medications   Prior to Admission medications   Medication Sig Start Date End Date Taking? Authorizing Provider  allopurinol (ZYLOPRIM) 100 MG tablet Take 1 tablet (100 mg total) by mouth daily. 06/28/15  Yes [name], MD  aspirin 81 MG tablet Take 1 tablet (81 mg total) by mouth daily. 06/28/15  Yes Thurnell Lose, MD  citalopram (CELEXA) 10 MG tablet Take 1 tablet (10 mg total) by mouth daily. 05/14/14  Yes [name], MD  docusate sodium (COLACE) 100 MG capsule Take 2 capsules (200 mg total) by mouth 2 (two) times daily. 06/22/15  Yes Thurnell Lose, MD  doxercalciferol (HECTOROL) 0.5 MCG capsule Take 1.5 mcg by mouth 3 (three) times a week. MWF   Yes Historical Provider, MD  rosuvastatin (CRESTOR) 20 MG tablet Take 1 tablet (20 mg total) by mouth at bedtime. 05/13/14  Yes [name], MD  Specialty Vitamins Products (ONE-A-DAY ENERGY FORMULA PO) Take 1 capsule by mouth daily.   Yes Historical Provider, MD  tiZANidine (ZANAFLEX) 4 MG tablet Take 1 tablet (4 mg total) by mouth every 6 (six) hours as needed for muscle spasms. 05/06/15  Yes [name], MD  atenolol (TENORMIN) 50 MG tablet TAKE 1/2 TABLET DAILY. Patient not taking: Reported on 06/25/2015 04/13/15   [name], MD   BP 144/54 mmHg  Pulse 67  Resp 15  SpO2 99% Physical Exam  Constitutional: He is oriented to person, place, and time. He appears well-developed and well-nourished.  HENT:  Head: Normocephalic and atraumatic.  Eyes: EOM are normal. Pupils are equal, round, and reactive to light.  Neck: Normal range of motion. Neck supple. No JVD present.  Cardiovascular: Normal rate and regular rhythm.  Exam reveals no gallop and no friction rub.   No murmur heard. Pulmonary/Chest: No respiratory distress. He has no wheezes. He has no rales. He exhibits no tenderness.  Abdominal: He exhibits no  distension. There is no tenderness. There is no rebound and no guarding.  Musculoskeletal: Normal range of motion.  Neurological: He is alert and oriented to person, place, and time.  Av fistula to left arm  Skin: No rash noted. No pallor.  Psychiatric: He has a normal mood and affect. His behavior is normal.    ED Course  Procedures (including critical care time) Labs Review Labs Reviewed  CBC WITH DIFFERENTIAL/PLATELET - Abnormal; Notable for the following:    RBC 2.88 (*)    Hemoglobin 8.7 (*)    HCT 27.2 (*)    RDW 16.1 (*)    All other components within normal limits  BASIC METABOLIC PANEL - Abnormal; Notable for the following:    Potassium 3.4 (*)    Chloride 94 (*)    Creatinine, Ser 4.04 (*)    Calcium 8.5 (*)    GFR calc non Af Amer 12 (*)    GFR calc Af  Amer 14 (*)    All other components within normal limits    Imaging Review No results found. I have personally reviewed and evaluated these images and lab results as part of my medical decision-making.   EKG Interpretation   Date/Time:  Wednesday June 30 2015 11:24:15 EDT Ventricular Rate:  74 PR Interval:  185 QRS Duration: 101 QT Interval:  449 QTC Calculation: 498 R Axis:   -45 Text Interpretation:  Sinus rhythm LAD, consider left anterior fascicular  block Borderline prolonged QT interval No significant change since last  tracing Confirmed by Roylee Chaffin MD, Quillian Quince [id number]) on 06/30/2015 11:44:27 AM      MDM   Final diagnoses:  Syncope, unspecified syncope type    81 yo M with a chief complaint of syncope. Likely due to hypovolemia just after dialysis. Patient feeling much better now we'll give 500 mL of fluid check CBC and BMP. EKG unchanged from prior.  Patient feeling better, lab work unremarkable.  CBC improved from most recent this week.     I have discussed the diagnosis/risks/treatment options with the patient and family and believe the pt to be eligible for discharge home to follow-up with PCP.  We also discussed returning to the ED immediately if new or worsening sx occur. We discussed the sx which are most concerning (e.g., recurrent episode) that necessitate immediate return. Medications administered to the patient during their visit and any new prescriptions provided to the patient are listed below.  Medications given during this visit Medications  sodium chloride 0.9 % bolus 500 mL (0 mLs Intravenous Stopped 06/30/15 1357)    Discharge Medication List as of 06/30/2015 12:53 PM       The patient appears reasonably screen and/or stabilized for discharge and I doubt any other medical condition or other [facility] requiring further screening, evaluation, or treatment in the ED at this time prior to discharge.    Deno Etienne, DO 06/30/15 2025

## 2015-06-30 NOTE — Telephone Encounter (Signed)
noted 

## 2015-06-30 NOTE — Discharge Instructions (Signed)
Syncope °Syncope means a person passes out (faints). The person usually wakes up in less than 5 minutes. It is important to seek medical care for syncope. °HOME CARE °· Have someone stay with you until you feel normal. °· Do not drive, use machines, or play sports until your doctor says it is okay. °· Keep all doctor visits as told. °· Lie down when you feel like you might pass out. Take deep breaths. Wait until you feel normal before standing up. °· Drink enough fluids to keep your pee (urine) clear or pale yellow. °· If you take blood pressure or heart medicine, get up slowly. Take several minutes to sit and then stand. °GET HELP RIGHT AWAY IF:  °· You have a severe headache. °· You have pain in the chest, belly (abdomen), or back. °· You are bleeding from the mouth or butt (rectum). °· You have black or tarry poop (stool). °· You have an irregular or very fast heartbeat. °· You have pain with breathing. °· You keep passing out, or you have shaking (seizures) when you pass out. °· You pass out when sitting or lying down. °· You feel confused. °· You have trouble walking. °· You have severe weakness. °· You have vision problems. °If you fainted, call your local emergency services (911 in [place] Do not drive yourself to the hospital. °MAKE SURE YOU:  °· Understand these instructions. °· Will watch your condition. °· Will get help right away if you are not doing well or get worse. °Document Released: 04/10/2008 Document Revised: 04/23/2012 Document Reviewed: 12/22/2011 °Exit[employer]® Patient Information ©2015 [employer]. This information is not intended to replace advice given to you by your health care provider. Make sure you discuss any questions you have with your health care provider. ° °

## 2015-06-30 NOTE — ED Notes (Signed)
Per ems- Pt comes from dialysis, had finished dialysis session and had syncopal episode while getting out of the chair, LOC for 1 min with twitching in right arm. Pt regained consciousness, no fall. Was a x 4 when ems arrived. Initial BP 100/60, HR 71, recent BP 140/80, CBG 91. Denies pain thinks he got out of chair too soon. Dialysis m,w,f.

## 2015-06-30 NOTE — Telephone Encounter (Signed)
Lattie Haw from [facility] called to let you know there was a missed nurses visit on 8/23

## 2015-07-01 DIAGNOSIS — D124 Benign neoplasm of descending colon: Secondary | ICD-10-CM | POA: Diagnosis not present

## 2015-07-01 DIAGNOSIS — I120 Hypertensive chronic kidney disease with stage 5 chronic kidney disease or end stage renal disease: Secondary | ICD-10-CM | POA: Diagnosis not present

## 2015-07-01 DIAGNOSIS — K5731 Diverticulosis of large intestine without perforation or abscess with bleeding: Secondary | ICD-10-CM | POA: Diagnosis not present

## 2015-07-01 DIAGNOSIS — N186 End stage renal disease: Secondary | ICD-10-CM | POA: Diagnosis not present

## 2015-07-01 DIAGNOSIS — D62 Acute posthemorrhagic anemia: Secondary | ICD-10-CM | POA: Diagnosis not present

## 2015-07-01 DIAGNOSIS — K2970 Gastritis, unspecified, without bleeding: Secondary | ICD-10-CM | POA: Diagnosis not present

## 2015-07-02 DIAGNOSIS — D631 Anemia in chronic kidney disease: Secondary | ICD-10-CM | POA: Diagnosis not present

## 2015-07-02 DIAGNOSIS — N186 End stage renal disease: Secondary | ICD-10-CM | POA: Diagnosis not present

## 2015-07-02 DIAGNOSIS — N2581 Secondary hyperparathyroidism of renal origin: Secondary | ICD-10-CM | POA: Diagnosis not present

## 2015-07-02 NOTE — Telephone Encounter (Signed)
[facility]  Patient Name: [name]  DOB: November 15, 1928    Initial Comment caller states her husbands left arm is swelling, has been going to dialisys, has trouble grasping with it and it is limp (not numb). Was in ER recently, talking a bit slurred.   Nurse Assessment  Nurse: Ronnie Derby, RN, Estill Bamberg Date/Time (Eastern Time): 07/02/2015 3:45:42 PM  Confirm and document reason for call. If symptomatic, describe symptoms. ---Caller states her husbands arm is weak and swollen. He is on dialysis. She did not know what to do. He was in the emergency room on Wednesday for passing out. His speech is garbled as well. The swelling has been going on for a week or two and has been told to keep the arm elevated. The weakness and garbled speech started more this week. He is having some difficulty grasping things with his hand as well.  Has the patient traveled out of the country within the last 30 days? ---No  Does the patient require triage? ---Yes  Related visit to physician within the last 2 weeks? ---Yes  Does the PT have any chronic conditions? (i.e. diabetes, asthma, etc.) ---Yes  List chronic conditions. ---kidney failure, syncope, diverticulosis, anemic     Guidelines    Guideline Title Affirmed Question Affirmed Notes  Neurologic Deficit [1] Weakness of the face, arm / hand, or leg / foot on one side of the body AND [2] gradual onset (e.g., days to weeks) AND [3] present now    Final Disposition User   See Physician within 4 Hours (or PCP triage) Ronnie Derby, RN, Estill Bamberg    Comments  Meagan Kenton Kingfisher charge attempted to check for an appointment for the patient. There were no availabilities so the nurse referred the caller to an urgent care.   Disagree/Comply: Comply

## 2015-07-05 ENCOUNTER — Inpatient Hospital Stay (HOSPITAL_COMMUNITY): Payer: Medicare Other

## 2015-07-05 ENCOUNTER — Emergency Department (HOSPITAL_COMMUNITY): Payer: Medicare Other

## 2015-07-05 ENCOUNTER — Inpatient Hospital Stay (HOSPITAL_COMMUNITY)
Admission: EM | Admit: 2015-07-05 | Discharge: 2015-07-10 | DRG: 064 | Disposition: A | Discharge status: Home Health | Payer: Medicare Other | Attending: Internal Medicine | Admitting: Internal Medicine

## 2015-07-05 ENCOUNTER — Other Ambulatory Visit (HOSPITAL_COMMUNITY): Payer: Medicare Other

## 2015-07-05 ENCOUNTER — Encounter (HOSPITAL_COMMUNITY): Payer: Self-pay

## 2015-07-05 DIAGNOSIS — D500 Iron deficiency anemia secondary to blood loss (chronic): Secondary | ICD-10-CM | POA: Insufficient documentation

## 2015-07-05 DIAGNOSIS — K921 Melena: Secondary | ICD-10-CM | POA: Diagnosis not present

## 2015-07-05 DIAGNOSIS — Z923 Personal history of irradiation: Secondary | ICD-10-CM

## 2015-07-05 DIAGNOSIS — M7989 Other specified soft tissue disorders: Secondary | ICD-10-CM | POA: Diagnosis present

## 2015-07-05 DIAGNOSIS — I739 Peripheral vascular disease, unspecified: Secondary | ICD-10-CM | POA: Diagnosis present

## 2015-07-05 DIAGNOSIS — D62 Acute posthemorrhagic anemia: Secondary | ICD-10-CM | POA: Diagnosis not present

## 2015-07-05 DIAGNOSIS — R531 Weakness: Secondary | ICD-10-CM | POA: Diagnosis present

## 2015-07-05 DIAGNOSIS — N186 End stage renal disease: Secondary | ICD-10-CM | POA: Diagnosis present

## 2015-07-05 DIAGNOSIS — M109 Gout, unspecified: Secondary | ICD-10-CM | POA: Diagnosis present

## 2015-07-05 DIAGNOSIS — D696 Thrombocytopenia, unspecified: Secondary | ICD-10-CM | POA: Diagnosis not present

## 2015-07-05 DIAGNOSIS — F32A Depression, unspecified: Secondary | ICD-10-CM | POA: Diagnosis present

## 2015-07-05 DIAGNOSIS — I639 Cerebral infarction, unspecified: Principal | ICD-10-CM | POA: Diagnosis present

## 2015-07-05 DIAGNOSIS — I10 Essential (primary) hypertension: Secondary | ICD-10-CM | POA: Diagnosis present

## 2015-07-05 DIAGNOSIS — Z8719 Personal history of other diseases of the digestive system: Secondary | ICD-10-CM | POA: Insufficient documentation

## 2015-07-05 DIAGNOSIS — Z992 Dependence on renal dialysis: Secondary | ICD-10-CM | POA: Diagnosis not present

## 2015-07-05 DIAGNOSIS — D631 Anemia in chronic kidney disease: Secondary | ICD-10-CM | POA: Diagnosis present

## 2015-07-05 DIAGNOSIS — Z8546 Personal history of malignant neoplasm of prostate: Secondary | ICD-10-CM

## 2015-07-05 DIAGNOSIS — E213 Hyperparathyroidism, unspecified: Secondary | ICD-10-CM | POA: Diagnosis present

## 2015-07-05 DIAGNOSIS — Z87891 Personal history of nicotine dependence: Secondary | ICD-10-CM

## 2015-07-05 DIAGNOSIS — K5900 Constipation, unspecified: Secondary | ICD-10-CM | POA: Diagnosis present

## 2015-07-05 DIAGNOSIS — R29810 Facial weakness: Secondary | ICD-10-CM | POA: Diagnosis present

## 2015-07-05 DIAGNOSIS — E861 Hypovolemia: Secondary | ICD-10-CM | POA: Diagnosis present

## 2015-07-05 DIAGNOSIS — I6789 Other cerebrovascular disease: Secondary | ICD-10-CM | POA: Diagnosis not present

## 2015-07-05 DIAGNOSIS — K2970 Gastritis, unspecified, without bleeding: Secondary | ICD-10-CM

## 2015-07-05 DIAGNOSIS — E785 Hyperlipidemia, unspecified: Secondary | ICD-10-CM | POA: Insufficient documentation

## 2015-07-05 DIAGNOSIS — Z8249 Family history of ischemic heart disease and other diseases of the circulatory system: Secondary | ICD-10-CM | POA: Diagnosis not present

## 2015-07-05 DIAGNOSIS — K922 Gastrointestinal hemorrhage, unspecified: Secondary | ICD-10-CM | POA: Diagnosis not present

## 2015-07-05 DIAGNOSIS — F329 Major depressive disorder, single episode, unspecified: Secondary | ICD-10-CM | POA: Diagnosis not present

## 2015-07-05 DIAGNOSIS — I158 Other secondary hypertension: Secondary | ICD-10-CM | POA: Diagnosis not present

## 2015-07-05 DIAGNOSIS — I120 Hypertensive chronic kidney disease with stage 5 chronic kidney disease or end stage renal disease: Secondary | ICD-10-CM | POA: Diagnosis present

## 2015-07-05 DIAGNOSIS — R471 Dysarthria and anarthria: Secondary | ICD-10-CM | POA: Diagnosis present

## 2015-07-05 DIAGNOSIS — K5731 Diverticulosis of large intestine without perforation or abscess with bleeding: Secondary | ICD-10-CM | POA: Diagnosis not present

## 2015-07-05 DIAGNOSIS — E8770 Fluid overload, unspecified: Secondary | ICD-10-CM | POA: Diagnosis not present

## 2015-07-05 DIAGNOSIS — Z8042 Family history of malignant neoplasm of prostate: Secondary | ICD-10-CM | POA: Diagnosis not present

## 2015-07-05 DIAGNOSIS — M1000 Idiopathic gout, unspecified site: Secondary | ICD-10-CM

## 2015-07-05 DIAGNOSIS — Z7982 Long term (current) use of aspirin: Secondary | ICD-10-CM | POA: Diagnosis not present

## 2015-07-05 LAB — DIFFERENTIAL
Basophils Absolute: 0 10*3/uL (ref 0.0–0.1)
Basophils Relative: 1 % (ref 0–1)
EOS PCT: 2 % (ref 0–5)
Eosinophils Absolute: 0.1 10*3/uL (ref 0.0–0.7)
LYMPHS ABS: 1.2 10*3/uL (ref 0.7–4.0)
LYMPHS PCT: 19 % (ref 12–46)
MONO ABS: 0.5 10*3/uL (ref 0.1–1.0)
Monocytes Relative: 8 % (ref 3–12)
NEUTROS ABS: 4.4 10*3/uL (ref 1.7–7.7)
NEUTROS PCT: 70 % (ref 43–77)

## 2015-07-05 LAB — COMPREHENSIVE METABOLIC PANEL
ALBUMIN: 3.5 g/dL (ref 3.5–5.0)
ALK PHOS: 51 U/L (ref 38–126)
ALT: 12 U/L — AB (ref 17–63)
ANION GAP: 13 (ref 5–15)
AST: 22 U/L (ref 15–41)
BILIRUBIN TOTAL: 0.3 mg/dL (ref 0.3–1.2)
BUN: 45 mg/dL — ABNORMAL HIGH (ref 6–20)
CALCIUM: 8.9 mg/dL (ref 8.9–10.3)
CO2: 28 mmol/L (ref 22–32)
CREATININE: 9.02 mg/dL — AB (ref 0.61–1.24)
Chloride: 99 mmol/L — ABNORMAL LOW (ref 101–111)
GFR calc Af Amer: 5 mL/min — ABNORMAL LOW (ref >60)
GFR calc non Af Amer: 5 mL/min — ABNORMAL LOW (ref >60)
GLUCOSE: 109 mg/dL — AB (ref 65–99)
Potassium: 4.1 mmol/L (ref 3.5–5.1)
SODIUM: 140 mmol/L (ref 135–145)
TOTAL PROTEIN: 5.9 g/dL — AB (ref 6.5–8.1)

## 2015-07-05 LAB — T4, FREE: FREE T4: 1 ng/dL (ref 0.61–1.12)

## 2015-07-05 LAB — CBC
HCT: 20.6 % — ABNORMAL LOW (ref 39.0–52.0)
HEMOGLOBIN: 6.8 g/dL — AB (ref 13.0–17.0)
MCH: 31.9 pg (ref 26.0–34.0)
MCHC: 33 g/dL (ref 30.0–36.0)
MCV: 96.7 fL (ref 78.0–100.0)
PLATELETS: 244 10*3/uL (ref 150–400)
RBC: 2.13 MIL/uL — AB (ref 4.22–5.81)
RDW: 17.8 % — ABNORMAL HIGH (ref 11.5–15.5)
WBC: 6.4 10*3/uL (ref 4.0–10.5)

## 2015-07-05 LAB — LIPID PANEL
CHOL/HDL RATIO: 2.9 ratio
CHOLESTEROL: 188 mg/dL (ref 0–200)
HDL: 65 mg/dL (ref >40)
LDL Cholesterol: 98 mg/dL (ref 0–99)
TRIGLYCERIDES: 123 mg/dL (ref <150)
VLDL: 25 mg/dL (ref 0–40)

## 2015-07-05 LAB — I-STAT TROPONIN, ED: Troponin i, poc: 0.07 ng/mL (ref 0.00–0.08)

## 2015-07-05 LAB — I-STAT CHEM 8, ED
BUN: 48 mg/dL — AB (ref 6–20)
CALCIUM ION: 1.09 mmol/L — AB (ref 1.13–1.30)
Chloride: 102 mmol/L (ref 101–111)
Creatinine, Ser: 8.6 mg/dL — ABNORMAL HIGH (ref 0.61–1.24)
Glucose, Bld: 102 mg/dL — ABNORMAL HIGH (ref 65–99)
HEMATOCRIT: 21 % — AB (ref 39.0–52.0)
Hemoglobin: 7.1 g/dL — ABNORMAL LOW (ref 13.0–17.0)
Potassium: 4 mmol/L (ref 3.5–5.1)
SODIUM: 139 mmol/L (ref 135–145)
TCO2: 27 mmol/L (ref 0–100)

## 2015-07-05 LAB — APTT: aPTT: 31 seconds (ref 24–37)

## 2015-07-05 LAB — PREPARE RBC (CROSSMATCH)

## 2015-07-05 LAB — ETHANOL

## 2015-07-05 LAB — PROTIME-INR
INR: 1.23 (ref 0.00–1.49)
PROTHROMBIN TIME: 15.7 s — AB (ref 11.6–15.2)

## 2015-07-05 LAB — TSH: TSH: 0.551 u[IU]/mL (ref 0.350–4.500)

## 2015-07-05 MED ORDER — ACETAMINOPHEN 325 MG PO TABS: 650.0000 mg | ORAL_TABLET | ORAL | Status: DC | PRN

## 2015-07-05 MED ORDER — SODIUM CHLORIDE 0.9 % IV SOLN: 250.0000 mL | INTRAVENOUS | Status: DC | PRN

## 2015-07-05 MED ORDER — SODIUM CHLORIDE 0.9 % IJ SOLN: 3.0000 mL | INTRAMUSCULAR | Status: DC | PRN

## 2015-07-05 MED ORDER — PANTOPRAZOLE SODIUM 40 MG PO TBEC
40.0000 mg | DELAYED_RELEASE_TABLET | Freq: Every day | ORAL | Status: DC
  Administered 2015-07-07 – 2015-07-10 (×4): 40 mg via ORAL
  Filled 2015-07-05 (×5): qty 1

## 2015-07-05 MED ORDER — ALLOPURINOL 100 MG PO TABS
100.0000 mg | ORAL_TABLET | Freq: Every day | ORAL | Status: DC
  Administered 2015-07-05 – 2015-07-10 (×6): 100 mg via ORAL
  Filled 2015-07-05 (×6): qty 1

## 2015-07-05 MED ORDER — DOCUSATE SODIUM 100 MG PO CAPS
200.0000 mg | ORAL_CAPSULE | Freq: Two times a day (BID) | ORAL | Status: DC
  Administered 2015-07-05 – 2015-07-10 (×10): 200 mg via ORAL
  Filled 2015-07-05 (×12): qty 2

## 2015-07-05 MED ORDER — POLYETHYLENE GLYCOL 3350 17 GM/SCOOP PO POWD
0.5000 | Freq: Once | ORAL | Status: AC
  Administered 2015-07-05: 127.5 g via ORAL
  Filled 2015-07-05: qty 255

## 2015-07-05 MED ORDER — PANTOPRAZOLE SODIUM 40 MG IV SOLR
40.0000 mg | Freq: Once | INTRAVENOUS | Status: AC
  Administered 2015-07-05: 40 mg via INTRAVENOUS
  Filled 2015-07-05: qty 40

## 2015-07-05 MED ORDER — ACETAMINOPHEN 650 MG RE SUPP: 650.0000 mg | RECTAL | Status: DC | PRN

## 2015-07-05 MED ORDER — DOXERCALCIFEROL 0.5 MCG PO CAPS
1.5000 ug | ORAL_CAPSULE | ORAL | Status: DC
  Administered 2015-07-07 – 2015-07-09 (×2): 1.5 ug via ORAL
  Filled 2015-07-05 (×3): qty 3

## 2015-07-05 MED ORDER — SODIUM CHLORIDE 0.9 % IJ SOLN
3.0000 mL | Freq: Two times a day (BID) | INTRAMUSCULAR | Status: DC
  Administered 2015-07-05 – 2015-07-09 (×8): 3 mL via INTRAVENOUS

## 2015-07-05 MED ORDER — ROSUVASTATIN CALCIUM 20 MG PO TABS
20.0000 mg | ORAL_TABLET | Freq: Every day | ORAL | Status: DC
  Administered 2015-07-05 – 2015-07-09 (×5): 20 mg via ORAL
  Filled 2015-07-05 (×7): qty 1

## 2015-07-05 MED ORDER — RENA-VITE PO TABS
1.0000 | ORAL_TABLET | Freq: Every day | ORAL | Status: DC
  Administered 2015-07-05 – 2015-07-09 (×5): 1 via ORAL
  Filled 2015-07-05 (×5): qty 1

## 2015-07-05 MED ORDER — CALCITRIOL 0.5 MCG PO CAPS
1.5000 ug | ORAL_CAPSULE | ORAL | Status: DC
  Administered 2015-07-07 – 2015-07-09 (×2): 1.5 ug via ORAL
  Filled 2015-07-05: qty 3

## 2015-07-05 MED ORDER — CITALOPRAM HYDROBROMIDE 10 MG PO TABS
10.0000 mg | ORAL_TABLET | Freq: Every day | ORAL | Status: DC
  Administered 2015-07-05 – 2015-07-10 (×6): 10 mg via ORAL
  Filled 2015-07-05 (×6): qty 1

## 2015-07-05 MED ORDER — DARBEPOETIN ALFA 200 MCG/0.4ML IJ SOSY
200.0000 ug | PREFILLED_SYRINGE | INTRAMUSCULAR | Status: DC
  Filled 2015-07-05: qty 0.4

## 2015-07-05 MED ORDER — STROKE: EARLY STAGES OF RECOVERY BOOK
Freq: Once | Status: AC
  Administered 2015-07-05: 12:00:00
  Filled 2015-07-05 (×2): qty 1

## 2015-07-05 MED ORDER — SODIUM CHLORIDE 0.9 % IV SOLN
Freq: Once | INTRAVENOUS | Status: AC
  Administered 2015-07-05: 09:00:00 via INTRAVENOUS

## 2015-07-05 NOTE — ED Notes (Signed)
Pts wife states that she noticed that over the weekend the pt "was dragging more, just kinda slow, I noticed his facial droop on Sunday after church".

## 2015-07-05 NOTE — Progress Notes (Signed)
1st unit of blood already hang by RN. Will notified HD for second unit. MD order for patient to have dialysis in the AM. Confirmed with HD staff.  Ave Filter, RN

## 2015-07-05 NOTE — Consult Note (Signed)
Consult Reason for Consult:left facial droop Referring Physician: left sided facial droop  CC: facial droop  HPI: [name] is an 81 y.o. male hx of ESRD on HD, HTN, HLD presenting with generalized fatigue, weakness and left facial droop. Wife notes she initially noted the facial droop on Sunday after church. She notes his speech sounded garbled at that time. She denies any noted focal weakness but notes he just seemed to be moving slower. He does note some swelling and heaviness in his LUE. He was told the swelling is secondary to his HD fistula.    Past Medical History  Diagnosis Date  . ANEMIA-IRON DEFICIENCY 03/09/2008  . DIVERTICULOSIS, COLON 03/09/2008  . GOUT 03/09/2008  . HYPERLIPIDEMIA 03/09/2008  . HYPERTENSION 03/09/2008  . PROSTATE CANCER, HX OF 03/09/2008  . ESRD (end stage renal disease) 03/09/2008  . Complex renal cyst 06/19/2011  . Hyperparathyroidism   . Prostate cancer 2002    Completed external beam radiation 2003.per HPI  . Arthritis     cervical spine.   Marland Kitchen History of blood transfusion   . Depression 03/26/2014  . Hemorrhoids 08/2009    internal.   . Radiation cystitis 2010.    Past Surgical History  Procedure Laterality Date  . Tonsillectomy    . Rotator cuff repair right  4/08  . S/p left hip replacement  2007    Dr. Percell Miller ortho  . Joint replacement      HIP  . Av fistula placement Left 01/24/2013    Procedure: INSERTION OF ARTERIOVENOUS (AV) GORE-TEX GRAFT ARM;  Surgeon: Rosetta Posner, MD;  Location: [place];  Service: Vascular;  Laterality: Left;  . Flexible sigmoidoscopy N/A 06/15/2015    Procedure: FLEXIBLE SIGMOIDOSCOPY;  Surgeon: Gatha Mayer, MD;  Location: [place];  Service: Endoscopy;  Laterality: N/A;  . Colonoscopy N/A 06/17/2015    Procedure: COLONOSCOPY;  Surgeon: Gatha Mayer, MD;  Location: [place];  Service: Endoscopy;  Laterality: N/A;  . Esophagogastroduodenoscopy N/A 06/17/2015    Procedure: ESOPHAGOGASTRODUODENOSCOPY (EGD);  Surgeon:  Gatha Mayer, MD;  Location: [facility] ENDOSCOPY;  Service: Endoscopy;  Laterality: N/A;    Family History  Problem Relation Age of Onset  . Hypertension Father   . Diabetes Father   . Cancer Father     prostate cancer  . Hypertension Brother     Social History:  reports that he quit smoking about 32 years ago. His smoking use included Cigarettes. He quit after 5 years of use. He has never used smokeless tobacco. He reports that he does not drink alcohol or use illicit drugs.  No Known Allergies  Medications: Scheduled:  ROS: Out of a complete 14 system review, the patient complains of only the following symptoms, and all other reviewed systems are negative. +fatigue, facial droop  Physical Examination: Filed Vitals:   07/05/15 0515  BP: 112/41  Pulse:   Temp:   Resp: 17   Physical Exam  Constitutional: He appears well-developed and well-nourished.  Psych: Affect appropriate to situation Eyes: No scleral injection HENT: No OP obstrucion Head: Normocephalic.  Cardiovascular: Normal rate and regular rhythm.  Respiratory: Effort normal and breath sounds normal.  GI: Soft. Bowel sounds are normal. No distension. There is no tenderness.  Skin: swelling/edema of distal LUE  Neurologic Examination Mental Status: Alert, oriented, thought content appropriate.  Speech fluent without evidence of aphasia. Mild dysarthria. Able to follow 3 step commands without difficulty. Cranial Nerves: II: funduscopic exam wnl bilaterally, visual fields  grossly normal, pupils equal, round, reactive to light and accommodation III,IV, VI: ptosis not present, extra-ocular motions intact bilaterally V,VII: left facial droop, improves with activation, forehead strength wnl, facial light touch sensation normal bilaterally VIII: hearing normal bilaterally IX,X: gag reflex present XI: trapezius strength/neck flexion strength normal bilaterally XII: tongue strength normal  Motor: Right : Upper  extremity    Left:     Upper extremity 5/5 deltoid       5/5 deltoid 5/5 biceps      5/5 biceps  5/5 triceps      5/5 triceps 5/5 hand grip      5/5 hand grip  Lower extremity     Lower extremity 5/5 hip flexor      5/5 hip flexor 5/5 quadricep      5/5 quadriceps  5/5 hamstrings     5/5 hamstrings 5/5 plantar flexion       5/5 plantar flexion 5/5 plantar extension     5/5 plantar extension Tone and bulk:normal tone throughout; no atrophy noted Sensory: Pinprick and light touch intact throughout, bilaterally Deep Tendon Reflexes: 1+ and symmetric throughout Plantars: Right: downgoing   Left: downgoing Cerebellar: normal finger-to-nose,  and normal heel-to-shin test Gait: deferred  Laboratory Studies:   Basic Metabolic Panel:  Recent Labs Lab 06/30/15 1207  NA 138  K 3.4*  CL 94*  CO2 31  GLUCOSE 89  BUN 7  CREATININE 4.04*  CALCIUM 8.5*    Liver Function Tests: No results for input(s): AST, ALT, ALKPHOS, BILITOT, PROT, ALBUMIN in the last 168 hours. No results for input(s): LIPASE, AMYLASE in the last 168 hours. No results for input(s): AMMONIA in the last 168 hours.  CBC:  Recent Labs Lab 06/30/15 1207  WBC 6.3  NEUTROABS 4.5  HGB 8.7*  HCT 27.2*  MCV 94.4  PLT 251    Cardiac Enzymes: No results for input(s): CKTOTAL, CKMB, CKMBINDEX, TROPONINI in the last 168 hours.  BNP: Invalid input(s): POCBNP  CBG: No results for input(s): GLUCAP in the last 168 hours.  Microbiology: Results for orders placed or performed during the hospital encounter of 01/24/13  Surgical pcr screen     Status: None   Collection Time: 01/24/13 10:23 AM  Result Value Ref Range Status   MRSA, PCR NEGATIVE NEGATIVE Final   Staphylococcus aureus NEGATIVE NEGATIVE Final    Comment:        The Xpert SA Assay (FDA approved for NASAL specimens in patients over 15 years of age), is one component of a comprehensive surveillance program.  Test performance has been validated by  [facility] for patients greater than or equal to 66 year old. It is not intended to diagnose infection nor to guide or monitor treatment.    Coagulation Studies: No results for input(s): LABPROT, INR in the last 72 hours.  Urinalysis: No results for input(s): COLORURINE, LABSPEC, PHURINE, GLUCOSEU, HGBUR, BILIRUBINUR, KETONESUR, PROTEINUR, UROBILINOGEN, NITRITE, LEUKOCYTESUR in the last 168 hours.  Invalid input(s): APPERANCEUR  Lipid Panel:     Component Value Date/Time   CHOL 233* 05/06/2015 0923   TRIG 74.0 05/06/2015 0923   HDL 84.80 05/06/2015 0923   CHOLHDL 3 05/06/2015 0923   VLDL 14.8 05/06/2015 0923   LDLCALC 133* 05/06/2015 0923    HgbA1C: No results found for: HGBA1C  Urine Drug Screen:  No results found for: LABOPIA, COCAINSCRNUR, LABBENZ, AMPHETMU, THCU, LABBARB  Alcohol Level: No results for input(s): ETH in the last 168 hours.  Other results:  Imaging: No results found.   Assessment/Plan:  81y/o gentleman hx of ESRD on HD, HTN, HLD presenting with generalized fatigue and left facial droop. Exam shows a forehead sparing left facial droop, otherwise non-focal.   -check CBC, CMP, UA -MRI brain. If negative no further neurological workup indicated at this time  Jim Like, DO [facility] [phone]  If 7pm- 7am, please page neurology on call as listed in [place]. 07/05/2015, 5:35 AM

## 2015-07-05 NOTE — ED Notes (Signed)
Attempted to call report

## 2015-07-05 NOTE — ED Notes (Signed)
Per the pt he started having facial droop on and left sided weakness on Friday.

## 2015-07-05 NOTE — Care Management Note (Signed)
Case Management Note  Patient Details  Name: [name] MRN: [id number] Date of Birth: [birth year]  Subjective/Objective:                    Action/Plan: Patient was admitted with CVA and GI bleed. Lives at home with spouse. Will follow for discharge needs pending PT/OT evals and MD orders.  Expected Discharge Date:                  Expected Discharge Plan:     In-House Referral:     Discharge planning Services     Post Acute Care Choice:    Choice offered to:     DME Arranged:    DME Agency:     HH Arranged:    HH Agency:     Status of Service:  In process, will continue to follow  Medicare Important Message Given:    Date Medicare IM Given:    Medicare IM give by:    Date Additional Medicare IM Given:    Additional Medicare Important Message give by:     If discussed at [place] of Stay Meetings, dates discussed:    Additional Comments:  Rolm Baptise, RN 07/05/2015, 10:48 AM

## 2015-07-05 NOTE — Consult Note (Signed)
[facility] Renal Consultation Note  Indication for Consultation:  Management of ESRD/hemodialysis; anemia, hypertension/volume and secondary hyperparathyroidism  HPI: [name] is a 81 y.o. male admitted with L facial droop / L sided weakness with  R acute right brain nonhemorrhagic stroke and patient's hemoglobin=6.8 (last hemoglobin at discharge was 8.7 recently with Diverticular bleed /syncope admit ). [facility], Neurology, GI seeing . He last had HD Friday  07/02/15  on schedule and was uneventful. Has had noted L arm swelling with L upper arm AVGG and outpt acess flows reported "okay".       Past Medical History  Diagnosis Date  . ANEMIA-IRON DEFICIENCY 03/09/2008  . DIVERTICULOSIS, COLON 03/09/2008  . GOUT 03/09/2008  . HYPERLIPIDEMIA 03/09/2008  . HYPERTENSION 03/09/2008  . PROSTATE CANCER, HX OF 03/09/2008  . ESRD (end stage renal disease) 03/09/2008  . Complex renal cyst 06/19/2011  . Hyperparathyroidism   . Prostate cancer 2002    Completed external beam radiation 2003.per HPI  . Arthritis     cervical spine.   Marland Kitchen History of blood transfusion   . Depression 03/26/2014  . Hemorrhoids 08/2009    internal.   . Radiation cystitis 2010.    Past Surgical History  Procedure Laterality Date  . Tonsillectomy    . Rotator cuff repair right  4/08  . S/p left hip replacement  2007    Dr. Percell Miller ortho  . Joint replacement      HIP  . Av fistula placement Left 01/24/2013    Procedure: INSERTION OF ARTERIOVENOUS (AV) GORE-TEX GRAFT ARM;  Surgeon: Rosetta Posner, MD;  Location: [place];  Service: Vascular;  Laterality: Left;  . Flexible sigmoidoscopy N/A 06/15/2015    Procedure: FLEXIBLE SIGMOIDOSCOPY;  Surgeon: Gatha Mayer, MD;  Location: [place];  Service: Endoscopy;  Laterality: N/A;  . Colonoscopy N/A 06/17/2015    Procedure: COLONOSCOPY;  Surgeon: Gatha Mayer, MD;  Location: [place];  Service: Endoscopy;  Laterality: N/A;  . Esophagogastroduodenoscopy  N/A 06/17/2015    Procedure: ESOPHAGOGASTRODUODENOSCOPY (EGD);  Surgeon: Gatha Mayer, MD;  Location: [facility] ENDOSCOPY;  Service: Endoscopy;  Laterality: N/A;      Family History  Problem Relation Age of Onset  . Hypertension Father   . Diabetes Father   . Cancer Father     prostate cancer  . Hypertension Brother       reports that he quit smoking about 32 years ago. His smoking use included Cigarettes. He quit after 5 years of use. He has never used smokeless tobacco. He reports that he does not drink alcohol or use illicit drugs.  No Known Allergies  Prior to Admission medications   Medication Sig Start Date End Date Taking? Authorizing Provider  allopurinol (ZYLOPRIM) 100 MG tablet Take 1 tablet (100 mg total) by mouth daily. 06/28/15  Yes [name], MD  aspirin 81 MG tablet Take 1 tablet (81 mg total) by mouth daily. 06/28/15  Yes Thurnell Lose, MD  citalopram (CELEXA) 10 MG tablet Take 1 tablet (10 mg total) by mouth daily. 05/14/14  Yes [name], MD  docusate sodium (COLACE) 100 MG capsule Take 2 capsules (200 mg total) by mouth 2 (two) times daily. 06/22/15  Yes Thurnell Lose, MD  doxercalciferol (HECTOROL) 0.5 MCG capsule Take 1.5 mcg by mouth 3 (three) times a week. MWF   Yes Historical Provider, MD  rosuvastatin (CRESTOR) 20 MG tablet Take 1 tablet (20 mg total) by mouth at  bedtime. 05/13/14  Yes [name], MD  Specialty Vitamins Products (ONE-A-DAY ENERGY FORMULA PO) Take 1 capsule by mouth daily.   Yes Historical Provider, MD  tiZANidine (ZANAFLEX) 4 MG tablet Take 1 tablet (4 mg total) by mouth every 6 (six) hours as needed for muscle spasms. 05/06/15  Yes [name], MD  atenolol (TENORMIN) 50 MG tablet TAKE 1/2 TABLET DAILY. Patient not taking: Reported on 06/25/2015 04/13/15   [name], MD    ITG:PQDIYM chloride, acetaminophen **OR** acetaminophen, sodium chloride  Results for orders placed or performed during the hospital encounter of 07/05/15 (from the past  48 hour(s))  Ethanol     Status: None   Collection Time: 07/05/15  5:40 AM  Result Value Ref Range   Alcohol, Ethyl (B) <5 <5 mg/dL    Comment:        LOWEST DETECTABLE LIMIT FOR SERUM ALCOHOL IS 5 mg/dL FOR MEDICAL PURPOSES ONLY   Protime-INR     Status: Abnormal   Collection Time: 07/05/15  5:40 AM  Result Value Ref Range   Prothrombin Time 15.7 (H) 11.6 - 15.2 seconds   INR 1.23 0.00 - 1.49  APTT     Status: None   Collection Time: 07/05/15  5:40 AM  Result Value Ref Range   aPTT 31 24 - 37 seconds  CBC     Status: Abnormal   Collection Time: 07/05/15  5:40 AM  Result Value Ref Range   WBC 6.4 4.0 - 10.5 K/uL   RBC 2.13 (L) 4.22 - 5.81 MIL/uL   Hemoglobin 6.8 (LL) 13.0 - 17.0 g/dL    Comment: REPEATED TO VERIFY CRITICAL RESULT CALLED TO, READ BACK BY AND VERIFIED WITH: BISHOP,L RN [id number] 07/05/15 TEETERN    HCT 20.6 (L) 39.0 - 52.0 %   MCV 96.7 78.0 - 100.0 fL   MCH 31.9 26.0 - 34.0 pg   MCHC 33.0 30.0 - 36.0 g/dL   RDW 17.8 (H) 11.5 - 15.5 %   Platelets 244 150 - 400 K/uL  Differential     Status: None   Collection Time: 07/05/15  5:40 AM  Result Value Ref Range   Neutrophils Relative % 70 43 - 77 %   Neutro Abs 4.4 1.7 - 7.7 K/uL   Lymphocytes Relative 19 12 - 46 %   Lymphs Abs 1.2 0.7 - 4.0 K/uL   Monocytes Relative 8 3 - 12 %   Monocytes Absolute 0.5 0.1 - 1.0 K/uL   Eosinophils Relative 2 0 - 5 %   Eosinophils Absolute 0.1 0.0 - 0.7 K/uL   Basophils Relative 1 0 - 1 %   Basophils Absolute 0.0 0.0 - 0.1 K/uL  Comprehensive metabolic panel     Status: Abnormal   Collection Time: 07/05/15  5:40 AM  Result Value Ref Range   Sodium 140 135 - 145 mmol/L   Potassium 4.1 3.5 - 5.1 mmol/L   Chloride 99 (L) 101 - 111 mmol/L   CO2 28 22 - 32 mmol/L   Glucose, Bld 109 (H) 65 - 99 mg/dL   BUN 45 (H) 6 - 20 mg/dL   Creatinine, Ser 9.02 (H) 0.61 - 1.24 mg/dL   Calcium 8.9 8.9 - 10.3 mg/dL   Total Protein 5.9 (L) 6.5 - 8.1 g/dL   Albumin 3.5 3.5 - 5.0 g/dL   AST 22  15 - 41 U/L   ALT 12 (L) 17 - 63 U/L   Alkaline Phosphatase 51 38 - 126  U/L   Total Bilirubin 0.3 0.3 - 1.2 mg/dL   GFR calc non Af Amer 5 (L) >60 mL/min   GFR calc Af Amer 5 (L) >60 mL/min    Comment: (NOTE) The eGFR has been calculated using the CKD EPI equation. This calculation has not been validated in all clinical situations. eGFR's persistently <60 mL/min signify possible Chronic Kidney Disease.    Anion gap 13 5 - 15  Type and screen     Status: None (Preliminary result)   Collection Time: 07/05/15  5:40 AM  Result Value Ref Range   ABO/RH(D) O POS    Antibody Screen NEG    Sample Expiration 07/08/2015    Unit Number [id number]    Blood Component Type RED CELLS,LR    Unit division 00    Status of Unit ALLOCATED    Transfusion Status OK TO TRANSFUSE    Crossmatch Result Compatible    Unit Number [id number]    Blood Component Type RED CELLS,LR    Unit division 00    Status of Unit ALLOCATED    Transfusion Status OK TO TRANSFUSE    Crossmatch Result Compatible   I-stat troponin, ED (not at [facility], [facility])     Status: None   Collection Time: 07/05/15  5:47 AM  Result Value Ref Range   Troponin i, poc 0.07 0.00 - 0.08 ng/mL   Comment 3            Comment: Due to the release kinetics of cTnI, a negative result within the first hours of the onset of symptoms does not rule out myocardial infarction with certainty. If myocardial infarction is still suspected, repeat the test at appropriate intervals.   I-Stat Chem 8, ED  (not at [facility], [facility])     Status: Abnormal   Collection Time: 07/05/15  5:49 AM  Result Value Ref Range   Sodium 139 135 - 145 mmol/L   Potassium 4.0 3.5 - 5.1 mmol/L   Chloride 102 101 - 111 mmol/L   BUN 48 (H) 6 - 20 mg/dL   Creatinine, Ser 8.60 (H) 0.61 - 1.24 mg/dL   Glucose, Bld 102 (H) 65 - 99 mg/dL   Calcium, Ion 1.09 (L) 1.13 - 1.30 mmol/L   TCO2 27 0 - 100 mmol/L   Hemoglobin 7.1 (L) 13.0 - 17.0 g/dL   HCT 21.0 (L) 39.0 - 52.0 %  Prepare  RBC     Status: None   Collection Time: 07/05/15  9:12 AM  Result Value Ref Range   Order Confirmation ORDER PROCESSED BY BLOOD BANK     ROS: see hpi for positives    Physical Exam: Filed Vitals:   07/05/15 0817  BP: 125/54  Pulse: 61  Temp: 98.7 F (37.1 C)  Resp: 16     General: Alert thin AA Male , nad ,Pleasant  HEENT: [place] , MMM, minimal Left    fascial droop  Eyes: nionicterric  Neck: no JD Heart: Occas irreg ,( SR with PVC on telementry) vr in 70s' , no rub or gallop Lungs: CTA  nonlabored breathing  Abdomen: BS pos , soft nontender, nondistrended  Extremities: no pedal edema /L U extrem edema to dorsum hand  Skin: warm dry no overt rash  Neuro: alert OX3, moves all extrem with Left extrem sided weakness and L facial droop   Dialysis Access: pos bruit L FA AVVG wit some swelling arm to hand   Dialysis Orders: [facility]   on M[facility]  .  EDW 67 HD Bath 2k,2ca  Time 3.5 hr Heparin none. Access LFA AVGG BFR 400 DFR 800    Calcitriol 1.5 mcg po  /HD  Mircera 193mg q 2 weeks just incr from 50 on 06/30/15   Units IV/HD   Other o-p labs hgb =7.8 06/30/15 and 7.6 06/25/15 ca 9.2 phos 3.9 pth 292   Assessment/Plan 1. ESRD -  HD MWF  Today using  NO HEPARIN, Transfuse= blood on hd   2. New R Ischemic Brain Stroke with L facial droop and L sided weakness- Neurology seeing with Admit team wu 3. Hypertension/volume  - bp stable=125/54 this am and  volume appearing stable Uf on hd only for transfusion 4. Anemia  - max esa HGB 6.8  Will finish  transfuse on HD today. 5.  Ho Diverticula bleed and syncope August 8- 16 2016 dc   - Gi seeing   6. Metabolic bone disease - po vit d on hd and binder with meals 7. L upper extrem swelling - noted admit team  eval with Ven doppler eval / [place] if Ven doppler negative , okay to use avgg on hd . 8. Nutrition -  Alb 3.5  renal carb , renal vitamin.  DErnest Haber PA-C [phone]/29/2016, 9:20 AM

## 2015-07-05 NOTE — ED Notes (Signed)
Patient transported to MRI 

## 2015-07-05 NOTE — Progress Notes (Signed)
Pt arrived to 5c04 @0810 , Pt A&Ox 4, c/o pain 0/10. Pt VS taken, pt on RA O2 100% from ED. IV right AC. Pt without distress. Family at the bedside. Will monitor.

## 2015-07-05 NOTE — Progress Notes (Signed)
STROKE TEAM PROGRESS NOTE   SUBJECTIVE (INTERVAL HISTORY) His family is at the bedside.  Overall he feels his condition is stable. He is frustrated at being back in the hospital so soon. He was admitted 06/15/15 for syncope and LGIB. He states his wife first noted slurred speech yesterday after church,  But he is not sure. He feels it has resolved.   OBJECTIVE Temp:  [97.8 F (36.6 C)-98.7 F (37.1 C)] 98.3 F (36.8 C) (08/29 1200) Pulse Rate:  [61-78] 66 (08/29 1200) Cardiac Rhythm:  [-]  Resp:  [8-34] 18 (08/29 1200) BP: (112-131)/(41-69) 115/52 mmHg (08/29 1200) SpO2:  [95 %-100 %] 100 % (08/29 1200)  CBC:   Recent Labs Lab 06/30/15 1207 07/05/15 0540 07/05/15 0549  WBC 6.3 6.4  --   NEUTROABS 4.5 4.4  --   HGB 8.7* 6.8* 7.1*  HCT 27.2* 20.6* 21.0*  MCV 94.4 96.7  --   PLT 251 244  --    Basic Metabolic Panel:   Recent Labs Lab 06/30/15 1207 07/05/15 0540 07/05/15 0549  NA 138 140 139  K 3.4* 4.1 4.0  CL 94* 99* 102  CO2 31 28  --   GLUCOSE 89 109* 102*  BUN 7 45* 48*  CREATININE 4.04* 9.02* 8.60*  CALCIUM 8.5* 8.9  --    Coagulation:   Recent Labs Lab 07/05/15 0540  LABPROT 15.7*  INR 1.23   Lipid Panel:     Component Value Date/Time   CHOL 188 07/05/2015 0935   TRIG 123 07/05/2015 0935   HDL 65 07/05/2015 0935   CHOLHDL 2.9 07/05/2015 0935   VLDL 25 07/05/2015 0935   LDLCALC 98 07/05/2015 0935   HgbA1c: No results found for: HGBA1C Urine Drug Screen: No results found for: LABOPIA, COCAINSCRNUR, LABBENZ, AMPHETMU, THCU, LABBARB    IMAGING  Mr Brain Wo Contrast 07/05/2015    1 x 1 x 2 cm area of nonhemorrhagic acute infarction affecting the subcortical and periventricular deep white matter of the RIGHT centrum semiovale. Chronic changes as described.     CUS -  Pending  TCD - pending  LE/UE venous doppler - pending  2D echo - Left ventricle: The cavity size was normal. Systolic function washyperdynamic. The estimated ejection fraction  was in the range of70% to 75%. There was dynamic obstruction in the mid cavity, witha peak velocity of 200 cm/sec and a peak gradient of 16 mm Hg.Wall motion was normal; there were no regional wall motionabnormalities. There was an increased relative contribution ofatrial contraction to ventricular filling, which may be due tohypovolemia. - Aortic valve: Valve area (VTI): 2.73 cm^2. Valve area (Vmax):2.05 cm^2. Valve area (Vmean): 2.14 cm^2. - Right ventricle: Systolic function was hyperdynamic. - Pulmonary arteries: PA peak pressure: 33 mm Hg (S). - Systemic veins: The inferior vena cava is collapsed. Impressions:  Findings are consistent with hypovolemia.   PHYSICAL EXAM  General - Thin elderly african [place] male, pleasant and talkative, in no apparent distress.  Mental Status -  Level of arousal and orientation to time, place, and person were intact. Language including expression, naming, repetition, comprehension was assessed and found intact. Attention span and concentration were normal. Recent and remote memory were intact. Fund of Knowledge was assessed and was intact.  Cranial Nerves II - XII - II - Visual field intact OU. III, IV, VI - Extraocular movements intact. V - Facial sensation decreased on R VII - Facial movement decreased L lower face VIII - Hearing & vestibular intact  bilaterally. X - mild dysarthria XII - Tongue protrusion intact.  Motor Strength - The patient's strength was normal in all extremities (mild weakness LUE due to increased edema due to graph per pt)  and pronator drift was absent.   Motor Tone - Muscle tone was assessed appendages and was normal.  Sensory - Light touch symmetrical bilaterally, decreased recognition of cold on the R face, arm and leg.    Coordination - The patient had normal movements in the hands and feet with no ataxia or dysmetria.  Tremor was absent.  Gait and Station - did not assess.   ASSESSMENT/PLAN Mr. Gabriel Krizman  Wise is a 81 y.o. male with history of ESRD on hemodialysis, colonic diverticulosis with LGIB requiring blood transfusion, hypertension & hyperlipidemia presenting with left facial droop, weakness and fatigue after a brief episode of loss of consciousness. He did not receive IV t-PA due to mild symptoms.   Stroke:  right centrum semiovale subcortical infarct secondary to small vessel disease source  Resultant  Left lower facial weakness and dysarthria  MRI  right centrum semiovale subcortical infarct  TCD pending    Carotid Doppler  pending   2D Echo  pending  LE and UE venous dopplers pending   LDL 98  HgbA1c pending  SCDs for VTE prophylaxis Diet renal with fluid restriction Fluid restriction:: 1200 mL Fluid; Room service appropriate?: Yes; Fluid consistency:: Thin  aspirin 81 mg orally every day prior to admission, now on no antithrombotic due to GIB, and anemia with Hgb 7.1. Recommend resumption of low dose aspirin when pt can tolerate from GI/anemia standpoint.  GIB x 3 with anemia  EGD and colonoscopy on 8/12/116  Tagged RBC scan shows mildly increased activity in the stomach but no signs of ongoing bleeding  Check CBC  resumption of low dose aspirin when pt can tolerate from GI/anemia standpoint  Hypertension  On no home medications  stable  Hyperlipidemia  Home meds:  crestor 20 mg daily, resumed in hospital  LDL 89, goal < 70  Continue statin at discharge  Other Stroke Risk Factors  Advanced age  Former Cigarette smoker, quit smoking 32 years ago   Other Active Problems  ESRD on HD MWF  Acute blood loss secondary to GIB & chronic disease anemia. Transfusion underway. On PPI  Syncope secondary to GIB  depression  Hospital day # 0  Rosalin Hawking, MD PhD Stroke Neurology 07/05/2015 10:19 PM   To contact Stroke Continuity provider, please refer to [email or web address] After hours, contact General Neurology

## 2015-07-05 NOTE — ED Provider Notes (Signed)
CSN: [id number]     Arrival date & time 07/05/15  0502 History   First MD Initiated Contact with Patient 07/05/15 0505     Chief Complaint  Patient presents with  . Weakness     (Consider location/radiation/quality/duration/timing/severity/associated sxs/prior Treatment) HPI  This is an 81 year old male with a history of end-stage renal disease on dialysis Monday, Wednesday, and Friday, prostate cancer, hypertension, hyperlipidemia who presents with weakness. Patient presents with left facial droop and left upper extremity heaviness. He states that his left upper extremity heaviness started on Friday. He was noted to have increasing swelling distal to his fistula site on the left. He was told by his doctor that it was secondary to his fistula. His wife noted yesterday after church that he had a left facial droop. She did not note any slurred speech. She did note that he "just didn't move as quickly as normal." No recent illnesses. He was seen and evaluated for syncope several days ago which was thought to be secondary to dialysis.  Patient was admitted earlier in August for GI bleed. He had both a colonoscopy and an endoscopy as well as a tagged red scan. GI bleed thought to be secondary to diverticular bleed. However, he also had some evidence of bleeding in his stomach. Patient's wife reports that he has had 2 bloody bowel movements since he has been at home. They have follow-up with GI later in the week.  Past Medical History  Diagnosis Date  . ANEMIA-IRON DEFICIENCY 03/09/2008  . DIVERTICULOSIS, COLON 03/09/2008  . GOUT 03/09/2008  . HYPERLIPIDEMIA 03/09/2008  . HYPERTENSION 03/09/2008  . PROSTATE CANCER, HX OF 03/09/2008  . ESRD (end stage renal disease) 03/09/2008  . Complex renal cyst 06/19/2011  . Hyperparathyroidism   . Prostate cancer 2002    Completed external beam radiation 2003.per HPI  . Arthritis     cervical spine.   Marland Kitchen History of blood transfusion   . Depression 03/26/2014  .  Hemorrhoids 08/2009    internal.   . Radiation cystitis 2010.   Past Surgical History  Procedure Laterality Date  . Tonsillectomy    . Rotator cuff repair right  4/08  . S/p left hip replacement  2007    Dr. Percell Miller ortho  . Joint replacement      HIP  . Av fistula placement Left 01/24/2013    Procedure: INSERTION OF ARTERIOVENOUS (AV) GORE-TEX GRAFT ARM;  Surgeon: Rosetta Posner, MD;  Location: [place] [facility];  Service: Vascular;  Laterality: Left;  . Flexible sigmoidoscopy N/A 06/15/2015    Procedure: FLEXIBLE SIGMOIDOSCOPY;  Surgeon: Gatha Mayer, MD;  Location: [place];  Service: Endoscopy;  Laterality: N/A;  . Colonoscopy N/A 06/17/2015    Procedure: COLONOSCOPY;  Surgeon: Gatha Mayer, MD;  Location: [place];  Service: Endoscopy;  Laterality: N/A;  . Esophagogastroduodenoscopy N/A 06/17/2015    Procedure: ESOPHAGOGASTRODUODENOSCOPY (EGD);  Surgeon: Gatha Mayer, MD;  Location: [facility] ENDOSCOPY;  Service: Endoscopy;  Laterality: N/A;   Family History  Problem Relation Age of Onset  . Hypertension Father   . Diabetes Father   . Cancer Father     prostate cancer  . Hypertension Brother    Social History  Substance Use Topics  . Smoking status: Former Smoker -- 5 years    Types: Cigarettes    Quit date: 09/17/1982  . Smokeless tobacco: Never Used  . Alcohol Use: No     Comment: rare    Review of Systems  Constitutional:  Negative for fever.  HENT: Negative for facial swelling.   Respiratory: Negative.  Negative for chest tightness and shortness of breath.   Cardiovascular: Negative.  Negative for chest pain.  Gastrointestinal: Negative.  Negative for abdominal pain.  Genitourinary: Negative.   Musculoskeletal: Negative for back pain.  Skin: Negative for rash.  Neurological: Positive for weakness. Negative for dizziness, numbness and headaches.       Left facial droop, left upper extremity heaviness  All other systems reviewed and are negative.     Allergies  Review  of patient's allergies indicates no known allergies.  Home Medications   Prior to Admission medications   Medication Sig Start Date End Date Taking? Authorizing Provider  allopurinol (ZYLOPRIM) 100 MG tablet Take 1 tablet (100 mg total) by mouth daily. 06/28/15  Yes [name], MD  aspirin 81 MG tablet Take 1 tablet (81 mg total) by mouth daily. 06/28/15  Yes Thurnell Lose, MD  citalopram (CELEXA) 10 MG tablet Take 1 tablet (10 mg total) by mouth daily. 05/14/14  Yes [name], MD  docusate sodium (COLACE) 100 MG capsule Take 2 capsules (200 mg total) by mouth 2 (two) times daily. 06/22/15  Yes Thurnell Lose, MD  doxercalciferol (HECTOROL) 0.5 MCG capsule Take 1.5 mcg by mouth 3 (three) times a week. MWF   Yes Historical Provider, MD  rosuvastatin (CRESTOR) 20 MG tablet Take 1 tablet (20 mg total) by mouth at bedtime. 05/13/14  Yes [name], MD  Specialty Vitamins Products (ONE-A-DAY ENERGY FORMULA PO) Take 1 capsule by mouth daily.   Yes Historical Provider, MD  tiZANidine (ZANAFLEX) 4 MG tablet Take 1 tablet (4 mg total) by mouth every 6 (six) hours as needed for muscle spasms. 05/06/15  Yes [name], MD  atenolol (TENORMIN) 50 MG tablet TAKE 1/2 TABLET DAILY. Patient not taking: Reported on 06/25/2015 04/13/15   [name], MD   BP 121/50 mmHg  Pulse 66  Temp(Src) 97.9 F (36.6 C) (Oral)  Resp 34  SpO2 99% Physical Exam  Constitutional: He is oriented to person, place, and time. He appears well-developed and well-nourished. No distress.  HENT:  Head: Normocephalic and atraumatic.  Mouth/Throat: Oropharynx is clear and moist.  Eyes: Pupils are equal, round, and reactive to light.  Cardiovascular: Normal rate, regular rhythm and normal heart sounds.   No murmur heard. Pulmonary/Chest: Effort normal and breath sounds normal. No respiratory distress. He has no wheezes.  Abdominal: Soft. Bowel sounds are normal. There is no tenderness. There is no rebound.  Musculoskeletal:  He exhibits no edema.  Fistula left upper extremity in the forearm  Neurological: He is alert and oriented to person, place, and time.  Left facial droop noted sparing the forehead, otherwise cranial nerves appear intact, 5 out of 5 strength with biceps, triceps, and grip, no drift noted, 5 out of 5 strength in bilateral hip flexors, dorsiflexion, and plantar flexion, fluent speech  Skin: Skin is warm and dry.  Psychiatric: He has a normal mood and affect.  Nursing note and vitals reviewed.   ED Course  Procedures (including critical care time)  CRITICAL CARE Performed by: Merryl Hacker   Total critical care time: 35 min  Critical care time was exclusive of separately billable procedures and treating other patients.  Critical care was necessary to treat or prevent imminent or life-threatening deterioration.  Critical care was time spent personally by me on the following activities: development of treatment plan with  patient and/or surrogate as well as nursing, discussions with consultants, evaluation of patient's response to treatment, examination of patient, obtaining history from patient or surrogate, ordering and performing treatments and interventions, ordering and review of laboratory studies, ordering and review of radiographic studies, pulse oximetry and re-evaluation of patient's condition.  Labs Review Labs Reviewed  PROTIME-INR - Abnormal; Notable for the following:    Prothrombin Time 15.7 (*)    All other components within normal limits  CBC - Abnormal; Notable for the following:    RBC 2.13 (*)    Hemoglobin 6.8 (*)    HCT 20.6 (*)    RDW 17.8 (*)    All other components within normal limits  COMPREHENSIVE METABOLIC PANEL - Abnormal; Notable for the following:    Chloride 99 (*)    Glucose, Bld 109 (*)    BUN 45 (*)    Creatinine, Ser 9.02 (*)    Total Protein 5.9 (*)    ALT 12 (*)    GFR calc non Af Amer 5 (*)    GFR calc Af Amer 5 (*)    All other  components within normal limits  I-STAT CHEM 8, ED - Abnormal; Notable for the following:    BUN 48 (*)    Creatinine, Ser 8.60 (*)    Glucose, Bld 102 (*)    Calcium, Ion 1.09 (*)    Hemoglobin 7.1 (*)    HCT 21.0 (*)    All other components within normal limits  ETHANOL  APTT  DIFFERENTIAL  URINE RAPID DRUG SCREEN, HOSP PERFORMED  URINALYSIS, ROUTINE W REFLEX MICROSCOPIC (NOT AT [facility])  I-STAT TROPOININ, ED  TYPE AND SCREEN    Imaging Review No results found. I have personally reviewed and evaluated these images and lab results as part of my medical decision-making.   EKG Interpretation   Date/Time:  Monday July 05 2015 05:11:05 EDT Ventricular Rate:  76 PR Interval:  188 QRS Duration: 105 QT Interval:  425 QTC Calculation: 478 R Axis:   -40 Text Interpretation:  Sinus rhythm Left axis deviation RSR' in V1 or V2,  probably normal variant Borderline prolonged QT interval Confirmed by  HORTON  MD, COURTNEY ([id number]) on 07/05/2015 5:46:02 AM      MDM   Final diagnoses:  Anemia, blood loss  History of GI bleed  Stroke    Patient presents with left facial droop that appears to spare the forehead and improves with smiling. Otherwise nonfocal. Given that it spares the forehead, would be concerned for possible stroke. Stroke protocol labs and imaging ordered. Neurology was consulted. Patient is not within the window for TPA. Dr. Janann Colonel evaluated the patient. Requesting MRI. No indication for CT at this time per neurology. Will obtain MRI. If negative, patient may be able to be discharged to dialysis.  Patient at MRI. Hemoglobin now 6.4. At discharge hemoglobin was 8 1/2. Wife states the patient has had some ongoing bleeding. Vital signs appear stable at this time. Patient was typed and screened. Discussed with Dr.Pyrtle, on call for [facility].  They will see the patient in consultation. I have reviewed the MRI and there appears to be a new diffusion-weighted uptake in the  right parietal lobe when compared to prior MRI. I discussed this with Dr. Armida Sans. He will reviewed the images. This is concerning for stroke. Patient will need admission. Both gastroenterology and neurology consult. Patient will need consultation with nephrology for dialysis. He has been typed and screened. Will hold blood  transfusion at this time and coordinate with dialysis.  Discussed admission with Dr. Dyann Kief.    Merryl Hacker, MD 07/05/15 [phone]

## 2015-07-05 NOTE — Consult Note (Signed)
[facility] Consult: 10:29 AM 07/05/2015  LOS: 0 days    Referring Provider: Dr Dyann Kief.  Primary Care Physician:  [name], MD Primary Gastroenterologist:  Dr Olevia Perches, now [place].     Reason for Consultation:  Anemia, FOBT +   HPI: [name] is a 81 y.o. male. PMH: ESRD since ~ 09/2014, HD on MWF. HTN. Degenerative spine dz. Iron deficiency and renal disease anemia on Mircera (synthetic, long acting epo with dose increase on 8/24). Prostate cancer 2002, treated with radiation. Radiation cystitis 2010. Diverticulosis.  08/2009 Colonoscopy. Screening study: internal hemorrhoids.   Inpt GI consult on 8/9 for anemia, hematochezia.  Hgb nadir of 7.7 , average is 10.5 to 11.5. Transfused PRBCs x 3 (8/10 - 8/12).   Had been taking 81 mg ASA and Aleve daily.  + weight loss ~ 15 # in 12 months. + constipation managed with OTC laxative and prune juice.   06/15/15 Flex sig: Dr [name]. Dark red blood and clots throughout, no transition to brown stool.  Left colon tics so could be more proximal vs backwash of blood from left colon. 06/16/15 NM bleeding scan: Increased radiotracer uptake in the stomach raises concern for gastritis. No small bowel or large bowel acute gastrointestinal bleeding focus is identified on this study. 06/17/15 Colonoscopy:  1. Sessile polyp was found in the descending colon; polypectomy was performed with cold forceps. Pathology: unremarkable colonic mucosa.  2. Moderate diverticulosis was noted in the left colon.  "Think this is diverticular hemorrhage resolved." 06/17/15 EGD: Antral non-erosive gastritis, erythema, otherwise normal  Admitted 8/29 with left sided weakness, slower speech, facial droop. Sxs noticed 8/28 but may have begun before that.   8/29 Brain MRI: acute,  non-hemorrhagic infarct. Incidental hx provided is of intermittent dark/black but formed stools.  Has not had hematochezia. Hgb down to 6.8 (was 8.7 on 8/24).  No nausea, no abdominal pain.  Appetite fair.  No anticoagulation initiated.   UE doppler studies ordered to eval left UE swelling which has been present for at least 2 weeks.  In 04/2015 underwent perc angioplasty of anastomosis for left UE graft associated venous stenosis which also caused swelling.  Pt thinks he may be taking oral iron, but only MVI (?if it contains iron?), no added po iron on list.    Past Medical History  Diagnosis Date  . ANEMIA-IRON DEFICIENCY 03/09/2008  . DIVERTICULOSIS, COLON 03/09/2008  . GOUT 03/09/2008  . HYPERLIPIDEMIA 03/09/2008  . HYPERTENSION 03/09/2008  . PROSTATE CANCER, HX OF 03/09/2008  . ESRD (end stage renal disease) 03/09/2008  . Complex renal cyst 06/19/2011  . Hyperparathyroidism   . Prostate cancer 2002    Completed external beam radiation 2003.per HPI  . Arthritis     cervical spine.   Marland Kitchen History of blood transfusion   . Depression 03/26/2014  . Hemorrhoids 08/2009    internal.   . Radiation cystitis 2010.    Past Surgical History  Procedure Laterality Date  . Tonsillectomy    . Rotator cuff repair right  4/08  . S/p  left hip replacement  2007    Dr. Percell Miller ortho  . Joint replacement      HIP  . Av fistula placement Left 01/24/2013    Procedure: INSERTION OF ARTERIOVENOUS (AV) GORE-TEX GRAFT ARM;  Surgeon: Rosetta Posner, MD;  Location: [place];  Service: Vascular;  Laterality: Left;  . Flexible sigmoidoscopy N/A 06/15/2015    Procedure: FLEXIBLE SIGMOIDOSCOPY;  Surgeon: Gatha Mayer, MD;  Location: [place];  Service: Endoscopy;  Laterality: N/A;  . Colonoscopy N/A 06/17/2015    Procedure: COLONOSCOPY;  Surgeon: Gatha Mayer, MD;  Location: [place];  Service: Endoscopy;  Laterality: N/A;  . Esophagogastroduodenoscopy N/A 06/17/2015    Procedure: ESOPHAGOGASTRODUODENOSCOPY (EGD);   Surgeon: Gatha Mayer, MD;  Location: [facility] ENDOSCOPY;  Service: Endoscopy;  Laterality: N/A;    Prior to Admission medications   Medication Sig Start Date End Date Taking? Authorizing Provider  allopurinol (ZYLOPRIM) 100 MG tablet Take 1 tablet (100 mg total) by mouth daily. 06/28/15  Yes [name], MD  aspirin 81 MG tablet Take 1 tablet (81 mg total) by mouth daily. 06/28/15  Yes Thurnell Lose, MD  citalopram (CELEXA) 10 MG tablet Take 1 tablet (10 mg total) by mouth daily. 05/14/14  Yes [name], MD  docusate sodium (COLACE) 100 MG capsule Take 2 capsules (200 mg total) by mouth 2 (two) times daily. 06/22/15  Yes Thurnell Lose, MD  doxercalciferol (HECTOROL) 0.5 MCG capsule Take 1.5 mcg by mouth 3 (three) times a week. MWF   Yes Historical Provider, MD  rosuvastatin (CRESTOR) 20 MG tablet Take 1 tablet (20 mg total) by mouth at bedtime. 05/13/14  Yes [name], MD  Specialty Vitamins Products (ONE-A-DAY ENERGY FORMULA PO) Take 1 capsule by mouth daily.   Yes Historical Provider, MD  tiZANidine (ZANAFLEX) 4 MG tablet Take 1 tablet (4 mg total) by mouth every 6 (six) hours as needed for muscle spasms. 05/06/15  Yes [name], MD  atenolol (TENORMIN) 50 MG tablet TAKE 1/2 TABLET DAILY. Patient not taking: Reported on 06/25/2015 04/13/15   [name], MD    Scheduled Meds: .  stroke: mapping our early stages of recovery book   Does not apply Once  . sodium chloride   Intravenous Once  . allopurinol  100 mg Oral Daily  . citalopram  10 mg Oral Daily  . docusate sodium  200 mg Oral BID  . doxercalciferol  1.5 mcg Oral Once per day on Mon Wed Fri  . multivitamin  1 tablet Oral QHS  . rosuvastatin  20 mg Oral QHS  . sodium chloride  3 mL Intravenous Q12H   Infusions:   PRN Meds: sodium chloride, acetaminophen **OR** acetaminophen, sodium chloride   Allergies as of 07/05/2015  . (No Known Allergies)    Family History  Problem Relation Age of Onset  . Hypertension Father     . Diabetes Father   . Cancer Father     prostate cancer  . Hypertension Brother     Social History   Social History  . Marital Status: Married    Spouse Name: N/A  . Number of Children: 2  . Years of Education: N/A   Occupational History  . retired Print production planner    Social History Main Topics  . Smoking status: Former Smoker -- 5 years    Types: Cigarettes    Quit date: 09/17/1982  . Smokeless tobacco: Never Used  . Alcohol Use:  No     Comment: rare  . Drug Use: No  . Sexual Activity: Not on file   Other Topics Concern  . Not on file   Social History Narrative    REVIEW OF SYSTEMS: Constitutional: Weight loss amounting to ~ 22# in last 12 months, 5# in last 8 weeks.  ENT:  No nose bleeds Pulm:  No cough of SOB CV:  No palpitations, no LE edema.  GU:  No hematuria, no frequency GI:  Per HPI Heme:  Per HPI   Transfusions:  Per HPI Neuro:  No headaches, no peripheral tingling or numbness Derm:  No itching, no rash or sores.  Endocrine:  No sweats or chills.  No polyuria or dysuria Immunization:  Not queried.  Travel:  None beyond local counties in last few months.    PHYSICAL EXAM: Vital signs in last 24 hours: Filed Vitals:   07/05/15 1000  BP: 131/59  Pulse: 78  Temp: 97.8 F (36.6 C)  Resp: 15   Wt Readings from Last 3 Encounters:  06/25/15 148 lb (67.132 kg)  06/21/15 145 lb 11.2 oz (66.089 kg)  05/06/15 153 lb (69.4 kg)    General: pleasant, alert, comfortable.  Does not look ill.  Head:  No swelling, no asymmetry.   Eyes:  No icterus or conj pallor Ears:  Not HOH  Nose:  No congestion Mouth:  Clear bil.  No blood or sores Neck:  No JVD or TMG Lungs:  Some crackles in bases, no cough or dyspnea Heart: RRR.  No mrg Abdomen:  Soft, NT, ND.  No masses or HSM.   Rectal: FOBT + hard stool, looks dark but not melenic.    Musc/Skeltl: no joint swelling or redness.  Extremities:  Swelling in left UE, extends into the hand  Neurologic:   Not confused, no gross strength weakness or discrepancy on limbs. Fluid speech, no aphasia. Follows 3 step commands without difficulty.  Skin:  No rash or sores.  Tattoos:  none Nodes:  No cervical or inguinal adenopathy.    Psych:  Cooperative, pleasant,   Intake/Output from previous day:   Intake/Output this shift:    LAB RESULTS:  Recent Labs  07/05/15 0540 07/05/15 0549  WBC 6.4  --   HGB 6.8* 7.1*  HCT 20.6* 21.0*  PLT 244  --    BMET Lab Results  Component Value Date   NA 139 07/05/2015   NA 140 07/05/2015   NA 138 06/30/2015   K 4.0 07/05/2015   K 4.1 07/05/2015   K 3.4* 06/30/2015   CL 102 07/05/2015   CL 99* 07/05/2015   CL 94* 06/30/2015   CO2 28 07/05/2015   CO2 31 06/30/2015   CO2 27 06/18/2015   GLUCOSE 102* 07/05/2015   GLUCOSE 109* 07/05/2015   GLUCOSE 89 06/30/2015   BUN 48* 07/05/2015   BUN 45* 07/05/2015   BUN 7 06/30/2015   CREATININE 8.60* 07/05/2015   CREATININE 9.02* 07/05/2015   CREATININE 4.04* 06/30/2015   CALCIUM 8.9 07/05/2015   CALCIUM 8.5* 06/30/2015   CALCIUM 8.8* 06/18/2015   LFT  Recent Labs  07/05/15 0540  PROT 5.9*  ALBUMIN 3.5  AST 22  ALT 12*  ALKPHOS 51  BILITOT 0.3   PT/INR Lab Results  Component Value Date   INR 1.23 07/05/2015    RADIOLOGY STUDIES: Mr Brain Wo Contrast  07/05/2015   CLINICAL DATA:  LEFT facial droop, LEFT upper extremity swelling, and speech difficulty which reportedly  began after church on 07/04/2015. There is a history of end-stage renal disease with dialysis.  EXAM: MRI HEAD WITHOUT CONTRAST  TECHNIQUE: Multiplanar, multiecho pulse sequences of the brain and surrounding structures were obtained without intravenous contrast.  COMPARISON:  06/15/2015 MRI.  FINDINGS: Moderate-sized approximate 1 x 1 x 2 cm area of acute infarction affects the subcortical and periventricular white matter of the RIGHT centrum semiovale; this is just superior and posterior to the RIGHT lentiform nucleus. Within  limits for detection on MR, no acute hemorrhage.  Advanced atrophy no hydrocephalus or mass lesion. No extra-axial collections. Extensive chronic microvascular ischemic change. Flow voids are maintained in the carotid, basilar, and both vertebral arteries, as well as the RIGHT and LEFT proximal middle cerebral arteries. Partial empty sella. Advanced cervical spondylosis. Retrolisthesis at C3-C4 is redemonstrated. No worrisome osseous findings. No acute sinus or mastoid fluid. Negative orbits.  Compared with prior MR, this infarct was not present and is acute.  IMPRESSION: 1 x 1 x 2 cm area of nonhemorrhagic acute infarction affecting the subcortical and periventricular deep white matter of the RIGHT centrum semiovale. See discussion above.  Chronic changes as described.   Electronically Signed   By: Staci Righter M.D.   On: 07/05/2015 07:31    ENDOSCOPIC STUDIES: Per HPI.   IMPRESSION:   *  FOBT +, intermittent dark stools. Hematochezia/anemia work up 3 weeks ago (EGD/flex sig/colonoscopy/Nuc RBC scan) with diagnosis of diverticular bleed.  Non-erosive gastritis on EGD, not on PPI or H2 blocker at discharge.  Pt could also have SB AVMs (has not had capsule endo study)  *  ABL and chronic disease anemia.  On Mircera. First of 2 units of blood currently infusing.   *  Acute CVA with left sided weakness, facial droop and slowed speech: currently improved.  No Heparin etc due to GIB.   *  ESRD.  HD on MWF.    PLAN:     *  Per Dr Ardis Hughs.  Initiate daily PPI and make sure he stays on this at discharge.    Azucena Freed  07/05/2015, 10:29 AM Pager: [phone]     ________________________________________________________________________  [facility] MD note:  I personally examined the patient, reviewed the data and agree with the assessment and plan described above.  2 gram drop in Hb since 5 days ago, dark stools.  His anemia probably contributed to his CVA.  Has had extensive GI testing,  noted above, next step in evaluation is small bowel capsule endoscopy.  I recommended that we proceed with that now.  Owens Loffler, MD [facility] Pager [phone]

## 2015-07-05 NOTE — H&P (Signed)
[facility] History and Physical  [name] [id number] DOB: 06/15/1929 DOA: 07/05/2015  Referring physician: Dr. Dina Rich PCP: [name], MD   Chief Complaint: Left-sided weakness and left facial droop.  HPI: [name] is a 81 y.o. male with past medical history significant for hypertension, hyperlipidemia, diverticulosis of the colon, end-stage renal disease on hemodialysis, depression and recent admission around August 16 due to GI bleed; who presented to the hospital secondary to left facial droop and left-sided weakness. Family reported that the patient's symptoms started around Sunday (07/04/2015) while he was at church; but there were mild and the patient declines comment to the hospital. On the day of admission they notices increase weakness on his left side and they also notice left facial droop with some slower speech. Patient denies any chest pain, palpitations, fever, chills, abdominal pain, nausea, vomiting or diarrhea. Is important to mention, that the patient seems he was discharged has been experiencing some melanotic stools and for the last 3 days there has been episodes of frank blood in her stools as well. In the ED and MRI demonstrated acute right brain nonhemorrhagic stroke and patient's hemoglobin was found to be 6.8 (last hemoglobin at discharge was 8.7). [facility] has been called to admit the patient for further evaluation and treatment. Neurology and gastroenterology were reported consulted to assist with the patient's care.  Review of Systems:  Negative except as otherwise mentioned in history of present illness.  Past Medical History  Diagnosis Date  . ANEMIA-IRON DEFICIENCY 03/09/2008  . DIVERTICULOSIS, COLON 03/09/2008  . GOUT 03/09/2008  . HYPERLIPIDEMIA 03/09/2008  . HYPERTENSION 03/09/2008  . PROSTATE CANCER, HX OF 03/09/2008  . ESRD (end stage renal disease) 03/09/2008  . Complex renal cyst 06/19/2011  . Hyperparathyroidism   . Prostate cancer  2002    Completed external beam radiation 2003.per HPI  . Arthritis     cervical spine.   Marland Kitchen History of blood transfusion   . Depression 03/26/2014  . Hemorrhoids 08/2009    internal.   . Radiation cystitis 2010.   Past Surgical History  Procedure Laterality Date  . Tonsillectomy    . Rotator cuff repair right  4/08  . S/p left hip replacement  2007    Dr. Percell Miller ortho  . Joint replacement      HIP  . Av fistula placement Left 01/24/2013    Procedure: INSERTION OF ARTERIOVENOUS (AV) GORE-TEX GRAFT ARM;  Surgeon: Rosetta Posner, MD;  Location: [place];  Service: Vascular;  Laterality: Left;  . Flexible sigmoidoscopy N/A 06/15/2015    Procedure: FLEXIBLE SIGMOIDOSCOPY;  Surgeon: Gatha Mayer, MD;  Location: [place];  Service: Endoscopy;  Laterality: N/A;  . Colonoscopy N/A 06/17/2015    Procedure: COLONOSCOPY;  Surgeon: Gatha Mayer, MD;  Location: [place];  Service: Endoscopy;  Laterality: N/A;  . Esophagogastroduodenoscopy N/A 06/17/2015    Procedure: ESOPHAGOGASTRODUODENOSCOPY (EGD);  Surgeon: Gatha Mayer, MD;  Location: [facility];  Service: Endoscopy;  Laterality: N/A;   Social History:  reports that he quit smoking about 32 years ago. His smoking use included Cigarettes. He quit after 5 years of use. He has never used smokeless tobacco. He reports that he does not drink alcohol or use illicit drugs.  No Known Allergies  Family History  Problem Relation Age of Onset  . Hypertension Father   . Diabetes Father   . Cancer Father     prostate cancer  . Hypertension Brother    Prior  to Admission medications   Medication Sig Start Date End Date Taking? Authorizing Provider  allopurinol (ZYLOPRIM) 100 MG tablet Take 1 tablet (100 mg total) by mouth daily. 06/28/15  Yes [name], MD  aspirin 81 MG tablet Take 1 tablet (81 mg total) by mouth daily. 06/28/15  Yes Thurnell Lose, MD  citalopram (CELEXA) 10 MG tablet Take 1 tablet (10 mg total) by mouth daily. 05/14/14   Yes [name], MD  docusate sodium (COLACE) 100 MG capsule Take 2 capsules (200 mg total) by mouth 2 (two) times daily. 06/22/15  Yes Thurnell Lose, MD  doxercalciferol (HECTOROL) 0.5 MCG capsule Take 1.5 mcg by mouth 3 (three) times a week. MWF   Yes Historical Provider, MD  rosuvastatin (CRESTOR) 20 MG tablet Take 1 tablet (20 mg total) by mouth at bedtime. 05/13/14  Yes [name], MD  Specialty Vitamins Products (ONE-A-DAY ENERGY FORMULA PO) Take 1 capsule by mouth daily.   Yes Historical Provider, MD  tiZANidine (ZANAFLEX) 4 MG tablet Take 1 tablet (4 mg total) by mouth every 6 (six) hours as needed for muscle spasms. 05/06/15  Yes [name], MD  atenolol (TENORMIN) 50 MG tablet TAKE 1/2 TABLET DAILY. Patient not taking: Reported on 06/25/2015 04/13/15   [name], MD   Physical Exam: Filed Vitals:   07/05/15 0730 07/05/15 0745 07/05/15 0752 07/05/15 0817  BP: 129/55 124/54  125/54  Pulse: 69 65  61  Temp:   98 F (36.7 C) 98.7 F (37.1 C)  TempSrc:    Oral  Resp:  8  16  SpO2: 100% 100%  100%    Wt Readings from Last 3 Encounters:  06/25/15 67.132 kg (148 lb)  06/21/15 66.089 kg (145 lb 11.2 oz)  05/06/15 69.4 kg (153 lb)    General:  Appears calm and in no distress. Left facial droop appreciated on physical exam. Patient was able to follow commands properly and denies any difficulty swallowing his own saliva. He is afebrile. Eyes: PERRL, normal lids, irises & conjunctiva, no icterus, no nystagmus ENT: Slightly hard of hearing, lips & tongue, dentures in place, no erythema, no exudates, no thrush. There is no drainage out of his ears or nostrils Neck: no LAD, masses or thyromegaly, no JVD Cardiovascular: RRR, positive systolic ejection murmur, no rubs, no gallops. Trace LE edema. Respiratory: CTA bilaterally, no w/r/r. Normal respiratory effort. Abdomen: soft, nt, nd, positive bowel sounds, no guarding Skin: no rash or induration seen on limited exam Musculoskeletal:  Left upper extremity swollen (but significantly improved according to family members), good pulses and AB fistula with good thrill appreciated; no joint swelling Psychiatric: grossly normal mood and affect, speech fluent and appropriate Neurologic: Positive left facial droop, tongue and uvula midline, normal finger to nose , muscle strength 5 out of 5 upper and lower extremities on his right side, and 4 out of 5 upper and lower extremities on his left side.           Labs on Admission:  Basic Metabolic Panel:  Recent Labs Lab 06/30/15 1207 07/05/15 0540 07/05/15 0549  NA 138 140 139  K 3.4* 4.1 4.0  CL 94* 99* 102  CO2 31 28  --   GLUCOSE 89 109* 102*  BUN 7 45* 48*  CREATININE 4.04* 9.02* 8.60*  CALCIUM 8.5* 8.9  --    Liver Function Tests:  Recent Labs Lab 07/05/15 0540  AST 22  ALT 12*  ALKPHOS 51  BILITOT 0.3  PROT 5.9*  ALBUMIN 3.5   CBC:  Recent Labs Lab 06/30/15 1207 07/05/15 0540 07/05/15 0549  WBC 6.3 6.4  --   NEUTROABS 4.5 4.4  --   HGB 8.7* 6.8* 7.1*  HCT 27.2* 20.6* 21.0*  MCV 94.4 96.7  --   PLT 251 244  --    CBG: No results for input(s): GLUCAP in the last 168 hours.  Radiological Exams on Admission: Mr Brain Wo Contrast  07/05/2015   CLINICAL DATA:  LEFT facial droop, LEFT upper extremity swelling, and speech difficulty which reportedly began after church on 07/04/2015. There is a history of end-stage renal disease with dialysis.  EXAM: MRI HEAD WITHOUT CONTRAST  TECHNIQUE: Multiplanar, multiecho pulse sequences of the brain and surrounding structures were obtained without intravenous contrast.  COMPARISON:  06/15/2015 MRI.  FINDINGS: Moderate-sized approximate 1 x 1 x 2 cm area of acute infarction affects the subcortical and periventricular white matter of the RIGHT centrum semiovale; this is just superior and posterior to the RIGHT lentiform nucleus. Within limits for detection on MR, no acute hemorrhage.  Advanced atrophy no hydrocephalus or  mass lesion. No extra-axial collections. Extensive chronic microvascular ischemic change. Flow voids are maintained in the carotid, basilar, and both vertebral arteries, as well as the RIGHT and LEFT proximal middle cerebral arteries. Partial empty sella. Advanced cervical spondylosis. Retrolisthesis at C3-C4 is redemonstrated. No worrisome osseous findings. No acute sinus or mastoid fluid. Negative orbits.  Compared with prior MR, this infarct was not present and is acute.  IMPRESSION: 1 x 1 x 2 cm area of nonhemorrhagic acute infarction affecting the subcortical and periventricular deep white matter of the RIGHT centrum semiovale. See discussion above.  Chronic changes as described.   Electronically Signed   By: Staci Righter M.D.   On: 07/05/2015 07:31    EKG:  Left axis deviation, no acute ischemic changes, regular rate and sinus rhythm.  Assessment/Plan 1-right side of the brain ischemic Stroke: Patient with left facial droop and left-sided weakness. -Difficult situation given the fact that he is actively bleeding at this moment for potentially use of secondary prevention therapy. -He was up to recently taking aspirin on daily basis. -Will check carotid Dopplers, 2-D echo, PT, OT, speech therapy -Neurology has been consulted and will follow their recommendations -Will continue statins and will check lipid profile/A1c.  2-acute blood loss anemia: Patient with recent admission secondary to GI bleed where he had colonoscopy, endoscopy and flexible sigmoidoscopy with results demonstrating mild gastritis and diverticulosis. -Will follow hemoglobin trend -Will transfuse 2 units of red blood cells to be given during hemodialysis -No heparin products -GI was consulted by ED and will follow any further recommendations  3-end-stage renal disease: Renal service has been consulted for continuation of hemodialysis during hospitalization  4-Gout: Will continue allopurinol. -No signs of gout flare on  exam  5-Essential hypertension: Blood pressure stable and per family members has been running low to the point that his home antihypertensive regimen has been discontinue. -Will monitor his vital signs no antihypertensive will be ordered -Permissive hypertension in the setting of stroke  6-Depression: Stable mood. No suicidal ideation or hallucinations -Will continue Celexa  7-Diverticulosis of colon with hemorrhage: As mentioned above; patient with recent admission secondary to GI bleed where he had endoscopy, colonoscopy and flexible sigmoidoscopy. -Will use PPI -Will follow hemoglobin trend -Will transfuse as needed. -GI was consulted and will follow any further recommendations; doubt that there is any further invasive procedure  needed at this time.  8- left upper extremity swelling: No erythema appreciated. -Will check ultrasound to rule out any potential blood clot -Patient with AV fistula on the arm -Will keep it elevated as per physical measures.  Neurology (Dr. Janann Colonel) Renal service (Dr. Justin Mend) Gastroenterologist consulted by ED (Dr. Henrene Pastor)  Code Status: Full code DVT Prophylaxis: SCDs Family Communication: Wife and son at bedside Disposition Plan: LOS > 2 midnights, inpatient, telemetry bed  Time spent: 65 minutes  [name] [facility] Pager [phone]

## 2015-07-06 ENCOUNTER — Inpatient Hospital Stay (HOSPITAL_COMMUNITY): Payer: Medicare Other

## 2015-07-06 ENCOUNTER — Encounter (HOSPITAL_COMMUNITY)
Admission: EM | Disposition: A | Discharge status: Home Health | Payer: Self-pay | Source: Home / Self Care | Attending: Internal Medicine

## 2015-07-06 DIAGNOSIS — I639 Cerebral infarction, unspecified: Secondary | ICD-10-CM

## 2015-07-06 DIAGNOSIS — Z8719 Personal history of other diseases of the digestive system: Secondary | ICD-10-CM | POA: Insufficient documentation

## 2015-07-06 DIAGNOSIS — N186 End stage renal disease: Secondary | ICD-10-CM

## 2015-07-06 DIAGNOSIS — E785 Hyperlipidemia, unspecified: Secondary | ICD-10-CM

## 2015-07-06 DIAGNOSIS — D62 Acute posthemorrhagic anemia: Secondary | ICD-10-CM

## 2015-07-06 DIAGNOSIS — I10 Essential (primary) hypertension: Secondary | ICD-10-CM

## 2015-07-06 DIAGNOSIS — I6789 Other cerebrovascular disease: Secondary | ICD-10-CM

## 2015-07-06 HISTORY — PX: GIVENS CAPSULE STUDY: SHX5432

## 2015-07-06 LAB — CBC
HCT: 24.4 % — ABNORMAL LOW (ref 39.0–52.0)
Hemoglobin: 7.9 g/dL — ABNORMAL LOW (ref 13.0–17.0)
MCH: 30.3 pg (ref 26.0–34.0)
MCHC: 32.4 g/dL (ref 30.0–36.0)
MCV: 93.5 fL (ref 78.0–100.0)
PLATELETS: 206 10*3/uL (ref 150–400)
RBC: 2.61 MIL/uL — ABNORMAL LOW (ref 4.22–5.81)
RDW: 18.7 % — AB (ref 11.5–15.5)
WBC: 6.5 10*3/uL (ref 4.0–10.5)

## 2015-07-06 LAB — URINALYSIS, ROUTINE W REFLEX MICROSCOPIC
Bilirubin Urine: NEGATIVE
Glucose, UA: NEGATIVE mg/dL
Hgb urine dipstick: NEGATIVE
Ketones, ur: NEGATIVE mg/dL
Leukocytes, UA: NEGATIVE
Nitrite: NEGATIVE
Protein, ur: 100 mg/dL — AB
Specific Gravity, Urine: 1.014 (ref 1.005–1.030)
Urobilinogen, UA: 0.2 mg/dL (ref 0.0–1.0)
pH: 7 (ref 5.0–8.0)

## 2015-07-06 LAB — RENAL FUNCTION PANEL
ALBUMIN: 3.2 g/dL — AB (ref 3.5–5.0)
Anion gap: 11 (ref 5–15)
BUN: 52 mg/dL — AB (ref 6–20)
CALCIUM: 8.7 mg/dL — AB (ref 8.9–10.3)
CO2: 26 mmol/L (ref 22–32)
CREATININE: 9.74 mg/dL — AB (ref 0.61–1.24)
Chloride: 99 mmol/L — ABNORMAL LOW (ref 101–111)
GFR calc Af Amer: 5 mL/min — ABNORMAL LOW (ref >60)
GFR, EST NON AFRICAN AMERICAN: 4 mL/min — AB (ref >60)
GLUCOSE: 92 mg/dL (ref 65–99)
PHOSPHORUS: 4.3 mg/dL (ref 2.5–4.6)
Potassium: 4 mmol/L (ref 3.5–5.1)
SODIUM: 136 mmol/L (ref 135–145)

## 2015-07-06 LAB — HEMOGLOBIN A1C
Hgb A1c MFr Bld: 4.9 % (ref 4.8–5.6)
Mean Plasma Glucose: 94 mg/dL

## 2015-07-06 LAB — URINE MICROSCOPIC-ADD ON

## 2015-07-06 LAB — GLUCOSE, CAPILLARY
GLUCOSE-CAPILLARY: 37 mg/dL — AB (ref 65–99)
Glucose-Capillary: 84 mg/dL (ref 65–99)

## 2015-07-06 LAB — SURGICAL PCR SCREEN
MRSA, PCR: NEGATIVE
Staphylococcus aureus: NEGATIVE

## 2015-07-06 SURGERY — IMAGING PROCEDURE, GI TRACT, INTRALUMINAL, VIA CAPSULE
Anesthesia: LOCAL

## 2015-07-06 SURGICAL SUPPLY — 1 items: TOWEL COTTON PACK 4EA (MISCELLANEOUS) ×4 IMPLANT

## 2015-07-06 NOTE — Evaluation (Signed)
Physical Therapy Evaluation Patient Details Name: [name] MRN: [id number] DOB: 08/23/1929 Today's Date: 07/06/2015   History of Present Illness  81 yo male admitted with GI bleed, syncopal episodes. Hx or ESRD, HD-MWF, HTN, prostate cancer.   Clinical Impression  Patient demonstrates deficits in functional mobility as indicated below. Will need continued skilled PT to address deficits and maximize function. Will see as indicated and progress as tolerated.     Follow Up Recommendations Home health PT;Supervision for mobility/OOB    Equipment Recommendations  None recommended by PT    Recommendations for Other Services       Precautions / Restrictions Precautions Precautions: Fall Restrictions Weight Bearing Restrictions: No      Mobility  Bed Mobility               General bed mobility comments: in recliner  Transfers Overall transfer level: Needs assistance Equipment used: None Transfers: Sit to/from Stand Sit to Stand: Min guard;Min assist         General transfer comment: min guard from recliner, min A from regular height toilet  Ambulation/Gait Ambulation/Gait assistance: Min guard Ambulation Distance (Feet): 195 Feet Assistive device: None Gait Pattern/deviations: Step-through pattern;Decreased stride length;Narrow base of support Gait velocity: decreased Gait velocity interpretation: Below normal speed for age/gender General Gait Details: slow steady gait, able to mobilize without significant deficits or instability  Stairs            Wheelchair Mobility    Modified Rankin (Stroke Patients Only) Modified Rankin (Stroke Patients Only) Pre-Morbid Rankin Score: No significant disability Modified Rankin: Moderately severe disability     Balance Overall balance assessment: Needs assistance Sitting-balance support: No upper extremity supported;Feet supported Sitting balance-Leahy Scale: Good     Standing balance support: No upper  extremity supported;During functional activity Standing balance-Leahy Scale: Fair Standing balance comment: external support at times                             Pertinent Vitals/Pain Pain Assessment: No/denies pain    Home Living Family/patient expects to be discharged to:: Private residence Living Arrangements: Spouse/significant other Available Help at Discharge: Family;Available 24 hours/day Type of Home: House Home Access: Stairs to enter Entrance Stairs-Rails: None Entrance Stairs-Number of Steps: 1 Home Layout: One level Home Equipment: Walker - 4 wheels;Cane - single point;Shower seat - built in      Prior Function Level of Independence: Independent               Hand Dominance   Dominant Hand: Right    Extremity/Trunk Assessment   Upper Extremity Assessment: LUE deficits/detail       LUE Deficits / Details: grossly 3+/5; decreased fine motor coordination; reports dropping items in Left hand   Lower Extremity Assessment: Defer to PT evaluation      Cervical / Trunk Assessment: Kyphotic  Communication   Communication: No difficulties  Cognition Arousal/Alertness: Awake/alert Behavior During Therapy: WFL for tasks assessed/performed Overall Cognitive Status: Within Functional Limits for tasks assessed                      General Comments General comments (skin integrity, edema, etc.): Patient with holter monitor on    Exercises        Assessment/Plan    PT Assessment Patient needs continued PT services  PT Diagnosis Difficulty walking;Generalized weakness   PT Problem List Decreased strength;Decreased activity tolerance;Decreased balance;Decreased mobility  PT  Treatment Interventions DME instruction;Gait training;Functional mobility training;Balance training;Therapeutic activities;Therapeutic exercise;Patient/family education   PT Goals (Current goals can be found in the Care Plan section) Acute Rehab PT Goals Patient  Stated Goal: not stated PT Goal Formulation: With patient/family Time For Goal Achievement: 07/20/15 Potential to Achieve Goals: Good    Frequency Min 3X/week   Barriers to discharge        Co-evaluation               End of Session Equipment Utilized During Treatment: Gait belt Activity Tolerance: Patient tolerated treatment well Patient left: in chair;with call bell/phone within reach;with family/visitor present Nurse Communication: Mobility status         Time: 0912-0930 PT Time Calculation (min) (ACUTE ONLY): 18 min   Charges:   PT Evaluation $Initial PT Evaluation Tier I: 1 Procedure     PT G CodesDuncan Dull Jul 10, 2015, 1:50 PM Alben Deeds, [place] DPT  [phone]

## 2015-07-06 NOTE — Progress Notes (Signed)
STROKE TEAM PROGRESS NOTE   SUBJECTIVE (INTERVAL HISTORY) His OT is at the bedside. They are recommending either HH or OP therapy. Awaiting PT eval. No family present. Patient up in the chair at the bedside.   OBJECTIVE Temp:  [97.8 F (36.6 C)-98.6 F (37 C)] 97.9 F (36.6 C) (08/30 0956) Pulse Rate:  [66-72] 68 (08/30 0956) Cardiac Rhythm:  [-] Heart block (08/30 0700) Resp:  [16-18] 18 (08/30 0956) BP: (115-144)/(50-66) 137/56 mmHg (08/30 0956) SpO2:  [99 %-100 %] 99 % (08/30 0956) Weight:  [67.132 kg (148 lb)] 67.132 kg (148 lb) (08/30 0908)  CBC:   Recent Labs Lab 06/30/15 1207 07/05/15 0540 07/05/15 0549 07/06/15 0526  WBC 6.3 6.4  --  6.5  NEUTROABS 4.5 4.4  --   --   HGB 8.7* 6.8* 7.1* 7.9*  HCT 27.2* 20.6* 21.0* 24.4*  MCV 94.4 96.7  --  93.5  PLT 251 244  --  99991111   Basic Metabolic Panel:   Recent Labs Lab 07/05/15 0540 07/05/15 0549 07/06/15 0426  NA 140 139 136  K 4.1 4.0 4.0  CL 99* 102 99*  CO2 28  --  26  GLUCOSE 109* 102* 92  BUN 45* 48* 52*  CREATININE 9.02* 8.60* 9.74*  CALCIUM 8.9  --  8.7*  PHOS  --   --  4.3   Coagulation:   Recent Labs Lab 07/05/15 0540  LABPROT 15.7*  INR 1.23   Lipid Panel:     Component Value Date/Time   CHOL 188 07/05/2015 0935   TRIG 123 07/05/2015 0935   HDL 65 07/05/2015 0935   CHOLHDL 2.9 07/05/2015 0935   VLDL 25 07/05/2015 0935   LDLCALC 98 07/05/2015 0935   HgbA1c: No results found for: HGBA1C Urine Drug Screen: No results found for: LABOPIA, COCAINSCRNUR, LABBENZ, AMPHETMU, THCU, LABBARB    IMAGING  Mr Brain Wo Contrast 07/05/2015    1 x 1 x 2 cm area of nonhemorrhagic acute infarction affecting the subcortical and periventricular deep white matter of the RIGHT centrum semiovale. Chronic changes as described.     CUS -  Bilateral: 1-39% ICA stenosis. Vertebral artery flow is antegrade.  TCD - pending  UE venous doppler - Bilateral: No evidence of DVT or superficial thrombosis. Left:  Hemodialysis graft is patent, however, there is not complete color fill. No echoes within the graft to suggest thrombus.   LE venous doppler - negative for DVT bilaterally  2D echo - Left ventricle: The cavity size was normal. Systolic function washyperdynamic. The estimated ejection fraction was in the range of70% to 75%. There was dynamic obstruction in the mid cavity, witha peak velocity of 200 cm/sec and a peak gradient of 16 mm Hg.Wall motion was normal; there were no regional wall motionabnormalities. There was an increased relative contribution ofatrial contraction to ventricular filling, which may be due tohypovolemia. - Aortic valve: Valve area (VTI): 2.73 cm^2. Valve area (Vmax):2.05 cm^2. Valve area (Vmean): 2.14 cm^2. - Right ventricle: Systolic function was hyperdynamic. - Pulmonary arteries: PA peak pressure: 33 mm Hg (S). - Systemic veins: The inferior vena cava is collapsed. Impressions:  Findings are consistent with hypovolemia.   PHYSICAL EXAM  General - Thin elderly african [place] male, pleasant and talkative, in no apparent distress.  Mental Status -  Level of arousal and orientation to time, place, and person were intact. Language including expression, naming, repetition, comprehension was assessed and found intact. Attention span and concentration were normal. Recent and remote  memory were intact. Fund of Knowledge was assessed and was intact.  Cranial Nerves II - XII - II - Visual field intact OU. III, IV, VI - Extraocular movements intact. V - Facial sensation decreased on R VII - Facial movement decreased L lower face VIII - Hearing & vestibular intact bilaterally. X - mild dysarthria XII - Tongue protrusion intact.  Motor Strength - The patient's strength was normal in all extremities (mild weakness LUE due to increased edema due to graph per pt)  and pronator drift was absent.   Motor Tone - Muscle tone was assessed appendages and was  normal.  Sensory - Light touch symmetrical bilaterally, decreased recognition of cold on the R face, arm and leg.    Coordination - The patient had normal movements in the hands and feet with no ataxia or dysmetria.  Tremor was absent.  Gait and Station - did not assess.   ASSESSMENT/PLAN [name] is a 81 y.o. male with history of ESRD on hemodialysis, colonic diverticulosis with LGIB requiring blood transfusion, hypertension & hyperlipidemia presenting with left facial droop, weakness and fatigue after a brief episode of loss of consciousness. He did not receive IV t-PA due to mild symptoms.   Stroke:  right centrum semiovale subcortical infarct secondary to small vessel disease source  Resultant  Left lower facial weakness and dysarthria  MRI  right centrum semiovale subcortical infarct  TCD pending    Carotid Doppler  Unremarkable    2D Echo  EF 70-75%  LE and UE venous dopplers no DVT   LDL 98  HgbA1c 4.9  SCDs for VTE prophylaxis Diet NPO time specified  aspirin 81 mg orally every day prior to admission, now on no antithrombotic due to GIB, and anemia with Hgb 7.1. Recommend resumption of low dose aspirin when pt can tolerate from GI/anemia standpoint.  Therapy recommendations: HH vs OP OT, PT eval pending  GIB x 3 with anemia  EGD and colonoscopy on 8/12/116  Tagged RBC scan shows mildly increased activity in the stomach but no signs of ongoing bleeding  Check CBC  resumption of low dose aspirin when pt can tolerate from GI/anemia standpoint  Capsule swallowed to eval GI blood loss  Hypertension  On no home medications  stable  Hyperlipidemia  Home meds:  crestor 20 mg daily, resumed in hospital  LDL 89, goal < 70  Continue statin at discharge  Other Stroke Risk Factors  Advanced age  Former Cigarette smoker, quit smoking 32 years ago   Other Active Problems  ESRD on HD MWF  Acute blood loss secondary to GIB & chronic disease  anemia. Transfusion underway. On PPI  Syncope secondary to GIB  depression  Hospital day # 1  Neurology will sign off. Please call with questions. Pt will follow up with Dr. Erlinda Hong at [facility] in about 2 months. Thanks for the consult.  Rosalin Hawking, MD PhD Stroke Neurology 07/06/2015 10:04 PM    To contact Stroke Continuity provider, please refer to [email or web address] After hours, contact General Neurology

## 2015-07-06 NOTE — Progress Notes (Signed)
VASCULAR LAB PRELIMINARY  PRELIMINARY  PRELIMINARY  PRELIMINARY  Carotid duplex completed.    Preliminary report:  Bilateral:  1-39% ICA stenosis.  Vertebral artery flow is antegrade.     Ralene Cork RVT 07/06/2015, 5:41 PM

## 2015-07-06 NOTE — Progress Notes (Signed)
[facility] ROUNDING NOTE   Subjective:   Interval History: capsule endoscopy no shortness of breath no chest pain no diarrhea   Objective:  Vital signs in last 24 hours:  Temp:  [97.8 F (36.6 C)-98.6 F (37 C)] 97.9 F (36.6 C) (08/30 0956) Pulse Rate:  [66-76] 68 (08/30 0956) Resp:  [16-18] 18 (08/30 0956) BP: (115-144)/(50-69) 137/56 mmHg (08/30 0956) SpO2:  [95 %-100 %] 99 % (08/30 0956) Weight:  [67.132 kg (148 lb)] 67.132 kg (148 lb) (08/30 0908)  Weight change:  Filed Weights   07/06/15 0908  Weight: 67.132 kg (148 lb)    Intake/Output: I/O last 3 completed shifts: In: 47 [Blood:335] Out: -    Intake/Output this shift:     CVS- RRR RS- CTA ABD- BS present soft non-distended EXT- no edema  AVF no issues   Basic Metabolic Panel:  Recent Labs Lab 06/30/15 1207 07/05/15 0540 07/05/15 0549 07/06/15 0426  NA 138 140 139 136  K 3.4* 4.1 4.0 4.0  CL 94* 99* 102 99*  CO2 31 28  --  26  GLUCOSE 89 109* 102* 92  BUN 7 45* 48* 52*  CREATININE 4.04* 9.02* 8.60* 9.74*  CALCIUM 8.5* 8.9  --  8.7*  PHOS  --   --   --  4.3    Liver Function Tests:  Recent Labs Lab 07/05/15 0540 07/06/15 0426  AST 22  --   ALT 12*  --   ALKPHOS 51  --   BILITOT 0.3  --   PROT 5.9*  --   ALBUMIN 3.5 3.2*   No results for input(s): LIPASE, AMYLASE in the last 168 hours. No results for input(s): AMMONIA in the last 168 hours.  CBC:  Recent Labs Lab 06/30/15 1207 07/05/15 0540 07/05/15 0549 07/06/15 0526  WBC 6.3 6.4  --  6.5  NEUTROABS 4.5 4.4  --   --   HGB 8.7* 6.8* 7.1* 7.9*  HCT 27.2* 20.6* 21.0* 24.4*  MCV 94.4 96.7  --  93.5  PLT 251 244  --  206    Cardiac Enzymes: No results for input(s): CKTOTAL, CKMB, CKMBINDEX, TROPONINI in the last 168 hours.  BNP: Invalid input(s): POCBNP  CBG: No results for input(s): GLUCAP in the last 168 hours.  Microbiology: Results for orders placed or performed during the hospital encounter of  07/05/15  Surgical pcr screen     Status: None   Collection Time: 07/05/15 11:58 PM  Result Value Ref Range Status   MRSA, PCR NEGATIVE NEGATIVE Final   Staphylococcus aureus NEGATIVE NEGATIVE Final    Comment:        The Xpert SA Assay (FDA approved for NASAL specimens in patients over 22 years of age), is one component of a comprehensive surveillance program.  Test performance has been validated by [facility] for patients greater than or equal to 48 year old. It is not intended to diagnose infection nor to guide or monitor treatment.     Coagulation Studies:  Recent Labs  07/05/15 0540  LABPROT 15.7*  INR 1.23    Urinalysis:  Recent Labs  07/06/15 0003  COLORURINE YELLOW  LABSPEC 1.014  PHURINE 7.0  GLUCOSEU NEGATIVE  HGBUR NEGATIVE  BILIRUBINUR NEGATIVE  KETONESUR NEGATIVE  PROTEINUR 100*  UROBILINOGEN 0.2  NITRITE NEGATIVE  LEUKOCYTESUR NEGATIVE      Imaging: Mr Brain Wo Contrast  07/05/2015   CLINICAL DATA:  LEFT facial droop, LEFT upper extremity swelling, and speech difficulty which  reportedly began after church on 07/04/2015. There is a history of end-stage renal disease with dialysis.  EXAM: MRI HEAD WITHOUT CONTRAST  TECHNIQUE: Multiplanar, multiecho pulse sequences of the brain and surrounding structures were obtained without intravenous contrast.  COMPARISON:  06/15/2015 MRI.  FINDINGS: Moderate-sized approximate 1 x 1 x 2 cm area of acute infarction affects the subcortical and periventricular white matter of the RIGHT centrum semiovale; this is just superior and posterior to the RIGHT lentiform nucleus. Within limits for detection on MR, no acute hemorrhage.  Advanced atrophy no hydrocephalus or mass lesion. No extra-axial collections. Extensive chronic microvascular ischemic change. Flow voids are maintained in the carotid, basilar, and both vertebral arteries, as well as the RIGHT and LEFT proximal middle cerebral arteries. Partial empty sella.  Advanced cervical spondylosis. Retrolisthesis at C3-C4 is redemonstrated. No worrisome osseous findings. No acute sinus or mastoid fluid. Negative orbits.  Compared with prior MR, this infarct was not present and is acute.  IMPRESSION: 1 x 1 x 2 cm area of nonhemorrhagic acute infarction affecting the subcortical and periventricular deep white matter of the RIGHT centrum semiovale. See discussion above.  Chronic changes as described.   Electronically Signed   By: Staci Righter M.D.   On: 07/05/2015 07:31     Medications:     . allopurinol  100 mg Oral Daily  . calcitRIOL  1.5 mcg Oral Q M,W,F-HD  . citalopram  10 mg Oral Daily  . [START ON 07/07/2015] darbepoetin (ARANESP) injection - DIALYSIS  200 mcg Intravenous Q Wed-HD  . docusate sodium  200 mg Oral BID  . doxercalciferol  1.5 mcg Oral Once per day on Mon Wed Fri  . multivitamin  1 tablet Oral QHS  . pantoprazole  40 mg Oral Q0600  . rosuvastatin  20 mg Oral QHS  . sodium chloride  3 mL Intravenous Q12H   sodium chloride, acetaminophen **OR** acetaminophen, sodium chloride  Assessment/ Plan:  1. ESRD - HD MWF will plan dialysi in AM 2. New R Ischemic Brain Stroke with L facial droop and L sided weakness-  3. Hypertension/volume - controlled 4. Anemia - transfused 1 unit. 5. Capsule swallowed to evaluate GI blood loss  6. Metabolic bone disease - po vit d on hd and binder with meals 7. L upper extrem swelling - noted admit team eval with Ven doppler eval / Last Accessflow of [place] reported ok at kidney center /Shuntogram if Ven doppler negative , okay to use avgg on hd . 8. Nutrition - Alb 3.5 renal carb , renal vitamin.     LOS: 1 Zhuri Krass W @TODAY @10 :04 AM

## 2015-07-06 NOTE — Progress Notes (Signed)
VASCULAR LAB PRELIMINARY  PRELIMINARY  PRELIMINARY  PRELIMINARY  Transcranial Doppler completed.    Preliminary report:  Transcranial Doppler completed  Ralene Cork, RVT 07/06/2015, 5:37 PM

## 2015-07-06 NOTE — Progress Notes (Addendum)
PATIENT DETAILS Name: [name] Age: 81 y.o. Sex: male Date of Birth: [birth year] Admit Date: 07/05/2015 Admitting Physician No admitting provider for patient encounter. [id number] [name], MD  Subjective: Melenotic stools overnight  Assessment/Plan: Principal Problem: Acute CVA: Mild left-sided deficits, await echo and carotids. A1c pending, LDL 98 (goal less than 70). Resume aspirin in the next few days  Active Problems: GI bleed: Recently admitted for GI bleed, EGD/colonoscopy on 8/12 was negative, readmitted with melanotic stools and acute blood loss anemia. Continue PPI, capsule endoscopy in progress. Since not actively bleeding, suspect we could resume aspirin in the next few days.  Acute blood loss anemia: Received 1 unit of PRBC on admission, will receive second unit with dialysis today. Anemia secondary to ESRD at baseline. Follow CBC  Dyslipidemia: LDL 98 (goal less than 70)-continue Crestor  Essential hypertension: Blood pressure currently controlled-resume atenolol in the next few days  Gout: Will continue allopurinol  Depression:Will continue Celexa  ESRD:HD MWF-nephrology following  L upper extrem swelling: Await ultrasound studies  Disposition: Remain inpatient  Antimicrobial agents  See below  Anti-infectives    None      DVT Prophylaxis: SCD's  Code Status: Full code   Family Communication None at bedside  Procedures: None  CONSULTS:  GI, nephrology and neurology  Time spent 30 minutes-Greater than 50% of this time was spent in counseling, explanation of diagnosis, planning of further management, and coordination of care.  MEDICATIONS: Scheduled Meds: . allopurinol  100 mg Oral Daily  . calcitRIOL  1.5 mcg Oral Q M,W,F-HD  . citalopram  10 mg Oral Daily  . [START ON 07/07/2015] darbepoetin (ARANESP) injection - DIALYSIS  200 mcg Intravenous Q Wed-HD  . docusate sodium  200 mg Oral BID  . doxercalciferol  1.5 mcg  Oral Once per day on Mon Wed Fri  . multivitamin  1 tablet Oral QHS  . pantoprazole  40 mg Oral Q0600  . rosuvastatin  20 mg Oral QHS  . sodium chloride  3 mL Intravenous Q12H   Continuous Infusions:  PRN Meds:.sodium chloride, acetaminophen **OR** acetaminophen, sodium chloride    PHYSICAL EXAM: Vital signs in last 24 hours: Filed Vitals:   07/06/15 0141 07/06/15 0723 07/06/15 0908 07/06/15 0956  BP: 125/61 144/50  137/56  Pulse: 66 68  68  Temp: 98.6 F (37 C) 98.1 F (36.7 C)  97.9 F (36.6 C)  TempSrc: Oral Oral  Oral  Resp: 18 18  18   Height:   5\' 8"  (1.727 m)   Weight:   67.132 kg (148 lb)   SpO2: 100% 99%  99%    Weight change:  Filed Weights   07/06/15 0908  Weight: 67.132 kg (148 lb)   Body mass index is 22.51 kg/(m^2).   Gen Exam: Awake and alert with clear speech.  Neck: Supple, No JVD.   Chest: B/L Clear.   CVS: S1 S2 Regular, no murmurs.  Abdomen: soft, BS +, non tender, non distended.  Extremities: no edema, lower extremities warm to touch. Neurologic: Mild left-sided deficits, slight left facial droop Skin: No Rash.   Wounds: N/A.   Intake/Output from previous day:  Intake/Output Summary (Last 24 hours) at 07/06/15 1326 Last data filed at 07/05/15 1350  Gross per 24 hour  Intake    335 ml  Output      0 ml  Net    335 ml  LAB RESULTS: CBC  Recent Labs Lab 06/30/15 1207 07/05/15 0540 07/05/15 0549 07/06/15 0526  WBC 6.3 6.4  --  6.5  HGB 8.7* 6.8* 7.1* 7.9*  HCT 27.2* 20.6* 21.0* 24.4*  PLT 251 244  --  206  MCV 94.4 96.7  --  93.5  MCH 30.2 31.9  --  30.3  MCHC 32.0 33.0  --  32.4  RDW 16.1* 17.8*  --  18.7*  LYMPHSABS 1.0 1.2  --   --   MONOABS 0.6 0.5  --   --   EOSABS 0.1 0.1  --   --   BASOSABS 0.0 0.0  --   --     Chemistries   Recent Labs Lab 06/30/15 1207 07/05/15 0540 07/05/15 0549 07/06/15 0426  NA 138 140 139 136  K 3.4* 4.1 4.0 4.0  CL 94* 99* 102 99*  CO2 31 28  --  26  GLUCOSE 89 109* 102* 92    BUN 7 45* 48* 52*  CREATININE 4.04* 9.02* 8.60* 9.74*  CALCIUM 8.5* 8.9  --  8.7*    CBG:  Recent Labs Lab 07/06/15 1136 07/06/15 1137 07/06/15 1150  GLUCAP 29* 37* 84    GFR Estimated Creatinine Clearance: 5.4 mL/min (by C-G formula based on Cr of 9.74).  Coagulation profile  Recent Labs Lab 07/05/15 0540  INR 1.23    Cardiac Enzymes No results for input(s): CKMB, TROPONINI, MYOGLOBIN in the last 168 hours.  Invalid input(s): CK  Invalid input(s): POCBNP No results for input(s): DDIMER in the last 72 hours. No results for input(s): HGBA1C in the last 72 hours.  Recent Labs  07/05/15 0935  CHOL 188  HDL 65  LDLCALC 98  TRIG 123  CHOLHDL 2.9    Recent Labs  07/05/15 0935  TSH 0.551   No results for input(s): VITAMINB12, FOLATE, FERRITIN, TIBC, IRON, RETICCTPCT in the last 72 hours. No results for input(s): LIPASE, AMYLASE in the last 72 hours.  Urine Studies No results for input(s): UHGB, CRYS in the last 72 hours.  Invalid input(s): UACOL, UAPR, USPG, UPH, UTP, UGL, UKET, UBIL, UNIT, UROB, ULEU, UEPI, UWBC, URBC, UBAC, CAST, UCOM, BILUA  MICROBIOLOGY: Recent Results (from the past 240 hour(s))  Surgical pcr screen     Status: None   Collection Time: 07/05/15 11:58 PM  Result Value Ref Range Status   MRSA, PCR NEGATIVE NEGATIVE Final   Staphylococcus aureus NEGATIVE NEGATIVE Final    Comment:        The Xpert SA Assay (FDA approved for NASAL specimens in patients over 31 years of age), is one component of a comprehensive surveillance program.  Test performance has been validated by [facility] for patients greater than or equal to 28 year old. It is not intended to diagnose infection nor to guide or monitor treatment.     RADIOLOGY STUDIES/RESULTS: Mr Herby Abraham Contrast  07/05/2015   CLINICAL DATA:  LEFT facial droop, LEFT upper extremity swelling, and speech difficulty which reportedly began after church on 07/04/2015. There is a  history of end-stage renal disease with dialysis.  EXAM: MRI HEAD WITHOUT CONTRAST  TECHNIQUE: Multiplanar, multiecho pulse sequences of the brain and surrounding structures were obtained without intravenous contrast.  COMPARISON:  06/15/2015 MRI.  FINDINGS: Moderate-sized approximate 1 x 1 x 2 cm area of acute infarction affects the subcortical and periventricular white matter of the RIGHT centrum semiovale; this is just superior and posterior to the RIGHT lentiform nucleus. Within limits for  detection on MR, no acute hemorrhage.  Advanced atrophy no hydrocephalus or mass lesion. No extra-axial collections. Extensive chronic microvascular ischemic change. Flow voids are maintained in the carotid, basilar, and both vertebral arteries, as well as the RIGHT and LEFT proximal middle cerebral arteries. Partial empty sella. Advanced cervical spondylosis. Retrolisthesis at C3-C4 is redemonstrated. No worrisome osseous findings. No acute sinus or mastoid fluid. Negative orbits.  Compared with prior MR, this infarct was not present and is acute.  IMPRESSION: 1 x 1 x 2 cm area of nonhemorrhagic acute infarction affecting the subcortical and periventricular deep white matter of the RIGHT centrum semiovale. See discussion above.  Chronic changes as described.   Electronically Signed   By: Staci Righter M.D.   On: 07/05/2015 07:31   Mr Brain Wo Contrast  06/15/2015   CLINICAL DATA:  Syncopal episode while straining for a bowel movement. Rectal bleeding.  EXAM: MRI HEAD WITHOUT CONTRAST  TECHNIQUE: Multiplanar, multiecho pulse sequences of the brain and surrounding structures were obtained without intravenous contrast.  COMPARISON:  MRI brain 06/26/2011.  FINDINGS: The diffusion-weighted images demonstrate no evidence for acute or subacute infarction. Moderate periventricular and subcortical T2 changes are similar to the prior study. Moderate generalized atrophy is again noted. The ventricles are proportionate to the degree of  atrophy.  No significant extra-axial fluid collection is present. Flow is present in the major intracranial arteries. The globes and orbits are intact. Mild mucosal thickening is present in the right frontal sinus and anterior ethmoid air cells. The mastoid air cells are clear. The paranasal sinuses are otherwise clear.  The skullbase is within normal limits. Midline intracranial contents are normal.  Moderate spondylosis is present in the upper cervical spine grade 1 retrolisthesis is present at C3-4 with progressive endplate degenerative change. There is chronic loss of disc height at C4-5 and C5-6.  IMPRESSION: 1. No acute intracranial abnormality. 2. Moderate atrophy and white matter disease is similar to the prior study. This likely reflects the sequela of chronic microvascular ischemia. 3. Moderate spondylosis in the upper cervical spine as described.   Electronically Signed   By: San Morelle M.D.   On: 06/15/2015 09:55   Nm Gi Blood Loss  06/16/2015   CLINICAL DATA:  Gastrointestinal bleeding earlier today  EXAM: NUCLEAR MEDICINE GASTROINTESTINAL BLEEDING SCAN  TECHNIQUE: Sequential abdominal and pelvic images were obtained following intravenous administration of Tc-96m labeled red blood cells for a 2 hr time span.  RADIOPHARMACEUTICALS:  24.4 mCi Tc-39m in-vitro labeled red cells.  COMPARISON:  None.  FINDINGS: There is gastric uptake of radiotracer which is not progressed during this study. There is no abnormal radiotracer uptake in the small or large bowel regions.  IMPRESSION: Increased radiotracer uptake in the stomach raises concern for gastritis. No small bowel or large bowel acute gastrointestinal bleeding focus is identified on this study.   Electronically Signed   By: Lowella Grip III M.D.   On: 06/16/2015 21:09   Nm Bone Scan Whole Body  06/24/2015   CLINICAL DATA:  81 year old with prostate cancer. Patient complains of left-sided pain.  EXAM: NUCLEAR MEDICINE WHOLE BODY BONE  SCAN  TECHNIQUE: Whole body anterior and posterior images were obtained approximately 3 hours after intravenous injection of radiopharmaceutical.  RADIOPHARMACEUTICALS:  25 mCi Technetium-65m MDP IV  COMPARISON:  01/02/2005 and 05/06/2015  FINDINGS: There is subtle increased uptake along the right side of the lower lumbar spine near the lumbosacral junction. This is likely degenerative in etiology based on the  degenerative changes and scoliosis in the lumbar spine. Changes compatible with a left hip arthroplasty. Slightly increased uptake around the left hip prosthesis is probably related to stress reaction. Mildly increased uptake in both knees are compatible degenerative changes. There is increased uptake along the medial aspect of the right fourth rib. Expected uptake in the renal collecting system. Increased uptake near the cervical-thoracic junction is likely degenerative in etiology. There is increased uptake along the right costovertebral margin at T9.  IMPRESSION: Subtle increased uptake along the medial right fourth rib. This could be degenerative in etiology but a subtle lesion or fracture cannot be excluded. Consider further characterization with chest and rib radiographs.  Scoliosis and degenerative changes in the lumbar spine.  No clear evidence for metastatic bone disease.  Left hip arthroplasty.   Electronically Signed   By: Markus Daft M.D.   On: 06/24/2015 15:58    Oren Binet, MD  [facility] Pager:[phone]  If 7PM-7AM, please contact night-coverage [email or web address] Password [email or web address] 07/06/2015, 1:26 PM   LOS: 1 day

## 2015-07-06 NOTE — Progress Notes (Signed)
VASCULAR LAB PRELIMINARY  PRELIMINARY  PRELIMINARY  PRELIMINARY  Left upper extremity venous duplex completed.    Preliminary report:  Bilateral:  No evidence of DVT or superficial thrombosis.   Left:  Hemodialysis graft is patent, however, there is not complete color fill.  No echoes within the graft to suggest thrombus.    [name], RVT 07/06/2015, 5:46 PM

## 2015-07-06 NOTE — Progress Notes (Signed)
  Echocardiogram 2D Echocardiogram has been performed.  [name] 07/06/2015, 2:00 PM

## 2015-07-06 NOTE — Progress Notes (Signed)
Daily Rounding Note  07/06/2015, 8:38 AM  LOS: 1 day   SUBJECTIVE:       Stools brown last night after 1/2 Miralax prep.  Capsule study in process, will complete at 8 PM tonight.   OBJECTIVE:         Vital signs in last 24 hours:    Temp:  [97.8 F (36.6 C)-98.6 F (37 C)] 98.1 F (36.7 C) (08/30 0723) Pulse Rate:  [66-78] 68 (08/30 0723) Resp:  [15-18] 18 (08/30 0723) BP: (115-144)/(50-69) 144/50 mmHg (08/30 0723) SpO2:  [95 %-100 %] 99 % (08/30 0723) Last BM Date: 07/03/15 There were no vitals filed for this visit. General: pleasant, slightly frail but overall looks well.    Heart: RRR Chest: basilar crackles, left > right.  No cough or dyspnea Abdomen: soft, active BS, NT, ND  Extremities: left UE edema, non-pitting Neuro/Psych:  Pleasant, alert, oriented x 3.   No gross deficits.  Calm, cooperative.   Intake/Output from previous day: 08/29 0701 - 08/30 0700 In: 335 [Blood:335] Out: -   Intake/Output this shift:    Lab Results:  Recent Labs  07/05/15 0540 07/05/15 0549 07/06/15 0526  WBC 6.4  --  6.5  HGB 6.8* 7.1* 7.9*  HCT 20.6* 21.0* 24.4*  PLT 244  --  206   BMET  Recent Labs  07/05/15 0540 07/05/15 0549 07/06/15 0426  NA 140 139 136  K 4.1 4.0 4.0  CL 99* 102 99*  CO2 28  --  26  GLUCOSE 109* 102* 92  BUN 45* 48* 52*  CREATININE 9.02* 8.60* 9.74*  CALCIUM 8.9  --  8.7*   LFT  Recent Labs  07/05/15 0540 07/06/15 0426  PROT 5.9*  --   ALBUMIN 3.5 3.2*  AST 22  --   ALT 12*  --   ALKPHOS 51  --   BILITOT 0.3  --    PT/INR  Recent Labs  07/05/15 0540  LABPROT 15.7*  INR 1.23   Hepatitis Panel No results for input(s): HEPBSAG, HCVAB, HEPAIGM, HEPBIGM in the last 72 hours.  Studies/Results: Mr Brain Wo Contrast  07/05/2015   CLINICAL DATA:  LEFT facial droop, LEFT upper extremity swelling, and speech difficulty which reportedly began after church on 07/04/2015.  There is a history of end-stage renal disease with dialysis.  EXAM: MRI HEAD WITHOUT CONTRAST  TECHNIQUE: Multiplanar, multiecho pulse sequences of the brain and surrounding structures were obtained without intravenous contrast.  COMPARISON:  06/15/2015 MRI.  FINDINGS: Moderate-sized approximate 1 x 1 x 2 cm area of acute infarction affects the subcortical and periventricular white matter of the RIGHT centrum semiovale; this is just superior and posterior to the RIGHT lentiform nucleus. Within limits for detection on MR, no acute hemorrhage.  Advanced atrophy no hydrocephalus or mass lesion. No extra-axial collections. Extensive chronic microvascular ischemic change. Flow voids are maintained in the carotid, basilar, and both vertebral arteries, as well as the RIGHT and LEFT proximal middle cerebral arteries. Partial empty sella. Advanced cervical spondylosis. Retrolisthesis at C3-C4 is redemonstrated. No worrisome osseous findings. No acute sinus or mastoid fluid. Negative orbits.  Compared with prior MR, this infarct was not present and is acute.  IMPRESSION: 1 x 1 x 2 cm area of nonhemorrhagic acute infarction affecting the subcortical and periventricular deep white matter of the RIGHT centrum semiovale. See discussion above.  Chronic changes as described.   Electronically Signed   By: Madie Reno  Curnes M.D.   On: 07/05/2015 07:31   Scheduled Meds: . allopurinol  100 mg Oral Daily  . calcitRIOL  1.5 mcg Oral Q M,W,F-HD  . citalopram  10 mg Oral Daily  . [START ON 07/07/2015] darbepoetin (ARANESP) injection - DIALYSIS  200 mcg Intravenous Q Wed-HD  . docusate sodium  200 mg Oral BID  . doxercalciferol  1.5 mcg Oral Once per day on Mon Wed Fri  . multivitamin  1 tablet Oral QHS  . pantoprazole  40 mg Oral Q0600  . rosuvastatin  20 mg Oral QHS  . sodium chloride  3 mL Intravenous Q12H   Continuous Infusions:  PRN Meds:.sodium chloride, acetaminophen **OR** acetaminophen, sodium chloride  ASSESMENT:    * FOBT +, intermittent dark stools. Hematochezia/anemia work up 3 weeks ago (EGD/flex sig/colonoscopy/Nuc RBC scan) with diagnosis of diverticular bleed. Non-erosive gastritis on EGD, not on PPI or H2 blocker at discharge but Protonix started yesterday.   Pt could also have SB AVMs (has not had capsule endo study)  * ABL and chronic disease anemia. On Mircera. Appropriate Hgb increase of 1 gram post 1 PRBCs yesterday.   * Acute CVA with left sided weakness, facial droop and slowed speech: Mild sxs all resolved.  No antithrombotics due to GIB.  Neuro suggests resuming 81 ASA if possible.   * ESRD. HD on MWF.    PLAN   *  Await findings of capsule study, pt does not need to remain inpt for final CE report. Do not expect this to be read before 9/1.    *  Give second unit of PRBC today during HD.  I updated the transfusion order with dialysis RN so this can happen.   *  OK to resume 81 ASA?   *  Cancelled his GI ROV of 9/1. Depending on results of CE, may or may not need to fup with GI [name] is MD).     Azucena Freed  07/06/2015, 8:38 AM Pager: [phone]   ________________________________________________________________________  Velora Heckler GI MD note:  I personally examined the patient, reviewed the data.  He is not overtly bleeding.  Capsule endo study ongoing today.  I think it is safe to resume ASA 81mg  today.  The capsule endo will be reviewed in next 2-3 days and if any obvious bleeding sites or targets we could consider further procedures (endoscopy, enteroscopy).  He does not need to remain in hosp to await the final reading as long as he is not overtly bleeding.   Owens Loffler, MD [facility] Pager [phone]

## 2015-07-06 NOTE — Progress Notes (Signed)
VASCULAR LAB PRELIMINARY  PRELIMINARY  PRELIMINARY  PRELIMINARY  Bilateral lower extremity venous duplex completed.    Preliminary report:  Bilateral:  No evidence of DVT, superficial thrombosis, or Baker's Cyst.   Cestone,Helene, RVT 07/06/2015, 5:39 PM

## 2015-07-06 NOTE — Progress Notes (Signed)
Occupational Therapy Evaluation Patient Details Name: [name] MRN: [id number] DOB: 01/17/1929 Today's Date: 07/06/2015    History of Present Illness 81 yo male admitted with GI bleed, syncopal episodes. Hx or ESRD, HD-MWF, HTN, prostate cancer.    Clinical Impression   Pt admitted with the above diagnoses and presents with below problem list. Pt will benefit from continued acute OT to address the below listed deficits and maximize independence with BADLs prior to d/c home with family. PTA pt was independent with ADLs. Pt presents with decreased LUE strength and fine motor coordination and decreased dynamic standing balance. Pt is currently setup to min A for UB ADLs, min guard for LB ADLs, min guard to min A for transfers. Session details below. OT to continue to follow acutely and recommend HHOT and 3n1 at d/c.      Follow Up Recommendations  Supervision/Assistance - 24 hour;Home health OT    Equipment Recommendations  3 in 1 bedside comode    Recommendations for Other Services PT consult     Precautions / Restrictions Precautions Precautions: Fall Restrictions Weight Bearing Restrictions: No      Mobility Bed Mobility               General bed mobility comments: in recliner  Transfers Overall transfer level: Needs assistance Equipment used: None Transfers: Sit to/from Stand Sit to Stand: Min guard;Min assist         General transfer comment: min guard from recliner, min A from regular height toilet    Balance Overall balance assessment: Needs assistance Sitting-balance support: No upper extremity supported;Feet supported Sitting balance-Leahy Scale: Good     Standing balance support: No upper extremity supported;During functional activity Standing balance-Leahy Scale: Fair Standing balance comment: external support at times                            ADL Overall ADL's : Needs assistance/impaired Eating/Feeding: NPO   Grooming:  Minimal assistance;Sitting Grooming Details (indicate cue type and reason): asssit for bilateral tasks due to LUE weakness Upper Body Bathing: Minimal assitance;Sitting   Lower Body Bathing: Min guard;Sit to/from stand   Upper Body Dressing : Minimal assistance;Sitting   Lower Body Dressing: Min guard;Sit to/from stand   Toilet Transfer: Minimal assistance;Regular Toilet;Grab bars Toilet Transfer Details (indicate cue type and reason): min A and use of grab bars/external support with both hands from regular toilet seat height. Placed  3n1 over toilet and positioned for pt's use next time. Toileting- Water quality scientist and Hygiene: Min guard;Sit to/from stand   Tub/ Shower Transfer: Min guard;Walk-in shower;Shower seat;Grab bars   Functional mobility during ADLs: Min guard General ADL Comments: Pt completed in-room functional mobility and toilet transfers and detailed above. Assist for UB ADLs due to decreased LUE strength/coordination. Pt close min guard for functional mobility and transfers. Pt reports feeling his balance is not back to baseline. Possible he would benefit from AD for ambulation but will defer to PT. Pt reporting dropping items in lt hand and had difficulty during session opening containers with left hand.      Vision Vision Assessment?: No apparent visual deficits   Perception     Praxis      Pertinent Vitals/Pain Pain Assessment: No/denies pain     Hand Dominance Right   Extremity/Trunk Assessment Upper Extremity Assessment Upper Extremity Assessment: LUE deficits/detail LUE Deficits / Details: grossly 3+/5; decreased fine motor coordination; reports dropping items in Left hand  LUE Sensation:  (inconsistent findings) LUE Coordination: decreased fine motor   Lower Extremity Assessment Lower Extremity Assessment: Defer to PT evaluation   Cervical / Trunk Assessment Cervical / Trunk Assessment: Kyphotic   Communication Communication Communication: No  difficulties   Cognition Arousal/Alertness: Awake/alert Behavior During Therapy: WFL for tasks assessed/performed Overall Cognitive Status: Within Functional Limits for tasks assessed                     General Comments       Exercises       Shoulder Instructions      Home Living Family/patient expects to be discharged to:: Private residence Living Arrangements: Spouse/significant other Available Help at Discharge: Family;Available 24 hours/day Type of Home: House Home Access: Stairs to enter [employer] of Steps: 1 Entrance Stairs-Rails: None Home Layout: One level     Bathroom Shower/Tub: Occupational psychologist: Standard     Home Equipment: Environmental consultant - 4 wheels;Cane - single point;Shower seat - built in          Prior Functioning/Environment Level of Independence: Independent             OT Diagnosis: Paresis   OT Problem List: Decreased strength;Decreased activity tolerance;Impaired balance (sitting and/or standing);Decreased coordination;Decreased knowledge of use of DME or AE;Decreased knowledge of precautions;Impaired UE functional use   OT Treatment/Interventions: Self-care/ADL training;Therapeutic exercise;Neuromuscular education;DME and/or AE instruction;Therapeutic activities;Patient/family education;Balance training    OT Goals(Current goals can be found in the care plan section) Acute Rehab OT Goals Patient Stated Goal: not stated OT Goal Formulation: With patient Time For Goal Achievement: 07/13/15 Potential to Achieve Goals: Good ADL Goals Pt Will Perform Grooming: with modified independence;standing;sitting Pt Will Perform Upper Body Bathing: with modified independence;sitting Pt Will Perform Lower Body Bathing: with supervision;with adaptive equipment;sit to/from stand Pt Will Perform Upper Body Dressing: with modified independence;sitting Pt Will Perform Lower Body Dressing: with supervision;with adaptive  equipment;sit to/from stand Pt Will Transfer to Toilet: with modified independence;ambulating (3n1 over toilet) Pt Will Perform Toileting - Clothing Manipulation and hygiene: with modified independence;sitting/lateral leans;sit to/from stand Pt Will Perform Tub/Shower Transfer: Shower transfer;with supervision;ambulating;shower seat;3 in 1 Pt/caregiver will Perform Home Exercise Program: With written HEP provided (increased LUE fine motor coordination)  OT Frequency: Min 3X/week   Barriers to D/C:            Co-evaluation              End of Session Equipment Utilized During Treatment: Gait belt  Activity Tolerance: Patient tolerated treatment well Patient left: in chair;with call bell/phone within reach;Other (comment) (with MD)   [id number] OT Time Calculation (min): 23 min Charges:  OT General Charges $OT Visit: 1 Procedure OT Evaluation $Initial OT Evaluation Tier I: 1 Procedure OT Treatments $Self Care/Home Management : 8-22 mins G-Codes:    Hortencia Pilar 01-Aug-2015, 1:21 PM

## 2015-07-07 DIAGNOSIS — N186 End stage renal disease: Secondary | ICD-10-CM | POA: Diagnosis not present

## 2015-07-07 DIAGNOSIS — I158 Other secondary hypertension: Secondary | ICD-10-CM | POA: Diagnosis not present

## 2015-07-07 DIAGNOSIS — K922 Gastrointestinal hemorrhage, unspecified: Secondary | ICD-10-CM | POA: Insufficient documentation

## 2015-07-07 DIAGNOSIS — D500 Iron deficiency anemia secondary to blood loss (chronic): Secondary | ICD-10-CM

## 2015-07-07 DIAGNOSIS — Z992 Dependence on renal dialysis: Secondary | ICD-10-CM | POA: Diagnosis not present

## 2015-07-07 DIAGNOSIS — K921 Melena: Secondary | ICD-10-CM

## 2015-07-07 DIAGNOSIS — K5731 Diverticulosis of large intestine without perforation or abscess with bleeding: Secondary | ICD-10-CM

## 2015-07-07 LAB — CBC
HEMATOCRIT: 22 % — AB (ref 39.0–52.0)
Hemoglobin: 7 g/dL — ABNORMAL LOW (ref 13.0–17.0)
MCH: 29.8 pg (ref 26.0–34.0)
MCHC: 31.8 g/dL (ref 30.0–36.0)
MCV: 93.6 fL (ref 78.0–100.0)
PLATELETS: 198 10*3/uL (ref 150–400)
RBC: 2.35 MIL/uL — ABNORMAL LOW (ref 4.22–5.81)
RDW: 18.2 % — AB (ref 11.5–15.5)
WBC: 5.8 10*3/uL (ref 4.0–10.5)

## 2015-07-07 LAB — RENAL FUNCTION PANEL
Albumin: 3.1 g/dL — ABNORMAL LOW (ref 3.5–5.0)
Anion gap: 11 (ref 5–15)
BUN: 58 mg/dL — AB (ref 6–20)
CHLORIDE: 101 mmol/L (ref 101–111)
CO2: 27 mmol/L (ref 22–32)
CREATININE: 10.58 mg/dL — AB (ref 0.61–1.24)
Calcium: 8.6 mg/dL — ABNORMAL LOW (ref 8.9–10.3)
GFR calc non Af Amer: 4 mL/min — ABNORMAL LOW (ref >60)
GFR, EST AFRICAN AMERICAN: 4 mL/min — AB (ref >60)
Glucose, Bld: 93 mg/dL (ref 65–99)
POTASSIUM: 4.8 mmol/L (ref 3.5–5.1)
Phosphorus: 5.2 mg/dL — ABNORMAL HIGH (ref 2.5–4.6)
Sodium: 139 mmol/L (ref 135–145)

## 2015-07-07 LAB — PREPARE RBC (CROSSMATCH)

## 2015-07-07 MED ORDER — FUROSEMIDE 10 MG/ML IJ SOLN: 20.0000 mg | Freq: Once | INTRAMUSCULAR | Status: DC

## 2015-07-07 MED ORDER — DIPHENHYDRAMINE HCL 50 MG/ML IJ SOLN
25.0000 mg | Freq: Once | INTRAMUSCULAR | Status: DC
  Filled 2015-07-07: qty 1

## 2015-07-07 MED ORDER — LIDOCAINE HCL (PF) 1 % IJ SOLN: 5.0000 mL | INTRAMUSCULAR | Status: DC | PRN

## 2015-07-07 MED ORDER — HEPARIN SODIUM (PORCINE) 1000 UNIT/ML DIALYSIS
1000.0000 [IU] | INTRAMUSCULAR | Status: DC | PRN
  Filled 2015-07-07: qty 1

## 2015-07-07 MED ORDER — SODIUM CHLORIDE 0.9 % IV SOLN
100.0000 mL | INTRAVENOUS | Status: DC | PRN
Start: 2015-07-07 — End: 2015-07-10

## 2015-07-07 MED ORDER — ALTEPLASE 2 MG IJ SOLR
2.0000 mg | Freq: Once | INTRAMUSCULAR | Status: DC | PRN
  Filled 2015-07-07: qty 2

## 2015-07-07 MED ORDER — ACETAMINOPHEN 325 MG PO TABS
650.0000 mg | ORAL_TABLET | Freq: Once | ORAL | Status: AC
  Administered 2015-07-07: 650 mg via ORAL

## 2015-07-07 MED ORDER — ACETAMINOPHEN 325 MG PO TABS
ORAL_TABLET | ORAL | Status: AC
Start: 2015-07-07 — End: 2015-07-08
  Filled 2015-07-07: qty 2

## 2015-07-07 MED ORDER — LIDOCAINE-PRILOCAINE 2.5-2.5 % EX CREA
1.0000 "application " | TOPICAL_CREAM | CUTANEOUS | Status: DC | PRN
  Filled 2015-07-07: qty 5

## 2015-07-07 MED ORDER — DIPHENHYDRAMINE HCL 25 MG PO CAPS
ORAL_CAPSULE | ORAL | Status: AC
  Administered 2015-07-07: 25 mg
  Filled 2015-07-07: qty 1

## 2015-07-07 MED ORDER — PENTAFLUOROPROP-TETRAFLUOROETH EX AERO
1.0000 | INHALATION_SPRAY | CUTANEOUS | Status: DC | PRN
Start: 2015-07-07 — End: 2015-07-08

## 2015-07-07 MED ORDER — CALCITRIOL 0.25 MCG PO CAPS
ORAL_CAPSULE | ORAL | Status: AC
  Filled 2015-07-07: qty 6

## 2015-07-07 MED ORDER — SODIUM CHLORIDE 0.9 % IV SOLN: Freq: Once | INTRAVENOUS | Status: DC

## 2015-07-07 MED ORDER — NEPRO/CARBSTEADY PO LIQD
237.0000 mL | ORAL | Status: DC | PRN
  Filled 2015-07-07: qty 237

## 2015-07-07 MED ORDER — SODIUM CHLORIDE 0.9 % IV SOLN: 100.0000 mL | INTRAVENOUS | Status: DC | PRN

## 2015-07-07 NOTE — Progress Notes (Signed)
Daily Rounding Note  07/07/2015, 9:47 AM  LOS: 2 days   SUBJECTIVE:       Pt feels well.  Did not go to HD yesterday, therefore never got his second PRBC.  Stool yesterday looked red/bloody.  No nausea or abd pain.  OBJECTIVE:         Vital signs in last 24 hours:    Temp:  [97.8 F (36.6 C)-98.2 F (36.8 C)] 98.2 F (36.8 C) (08/31 0507) Pulse Rate:  [62-69] 69 (08/31 0507) Resp:  [18-20] 20 (08/31 0507) BP: (122-152)/(56-70) 127/60 mmHg (08/31 0507) SpO2:  [98 %-100 %] 98 % (08/31 0507) Last BM Date: 07/06/15 Filed Weights   07/06/15 0908  Weight: 148 lb (67.132 kg)   General: pleasant, comfortable.  Does not look ill   Heart: RRR Chest: clear bil.  Not SOB or coughing Abdomen: soft, NT, active BS, ND.   Extremities: improved but persistent left UE swelling Neuro/Psych:  Pleasant, comfortable. Relaxed. Left arm strength slightly diminished, though hardly perceptible. No tremor.    Intake/Output from previous day: 08/30 0701 - 08/31 0700 In: 0  Out: 250 [Urine:250]  Intake/Output this shift:    Lab Results:  Recent Labs  07/05/15 0540 07/05/15 0549 07/06/15 0526 07/07/15 0537  WBC 6.4  --  6.5 5.8  HGB 6.8* 7.1* 7.9* 7.0*  HCT 20.6* 21.0* 24.4* 22.0*  PLT 244  --  206 198   BMET  Recent Labs  07/05/15 0540 07/05/15 0549 07/06/15 0426 07/07/15 0537  NA 140 139 136 139  K 4.1 4.0 4.0 4.8  CL 99* 102 99* 101  CO2 28  --  26 27  GLUCOSE 109* 102* 92 93  BUN 45* 48* 52* 58*  CREATININE 9.02* 8.60* 9.74* 10.58*  CALCIUM 8.9  --  8.7* 8.6*   LFT  Recent Labs  07/05/15 0540 07/06/15 0426 07/07/15 0537  PROT 5.9*  --   --   ALBUMIN 3.5 3.2* 3.1*  AST 22  --   --   ALT 12*  --   --   ALKPHOS 51  --   --   BILITOT 0.3  --   --    PT/INR  Recent Labs  07/05/15 0540  LABPROT 15.7*  INR 1.23   Hepatitis Panel No results for input(s): HEPBSAG, HCVAB, HEPAIGM, HEPBIGM in the last  72 hours.  Studies/Results: No results found.   Scheduled Meds: . sodium chloride   Intravenous Once  . acetaminophen  650 mg Oral Once  . allopurinol  100 mg Oral Daily  . calcitRIOL  1.5 mcg Oral Q M,W,F-HD  . citalopram  10 mg Oral Daily  . darbepoetin (ARANESP) injection - DIALYSIS  200 mcg Intravenous Q Wed-HD  . diphenhydrAMINE  25 mg Intravenous Once  . docusate sodium  200 mg Oral BID  . doxercalciferol  1.5 mcg Oral Once per day on Mon Wed Fri  . furosemide  20 mg Intravenous Once  . multivitamin  1 tablet Oral QHS  . pantoprazole  40 mg Oral Q0600  . rosuvastatin  20 mg Oral QHS  . sodium chloride  3 mL Intravenous Q12H   Continuous Infusions:  PRN Meds:.sodium chloride, acetaminophen **OR** acetaminophen, sodium chloride   ASSESMENT:   * FOBT +, intermittent dark stools. Hematochezia/anemia work up 3 weeks ago (EGD/flex sig/colonoscopy/Nuc RBC scan) with diagnosis of diverticular bleed. Non-erosive gastritis on EGD, not on PPI or H2 blocker at discharge  but Protonix started yesterday.  Pt could also have SB AVMs   * ABL and chronic disease anemia. On Mircera. S/p PRBCs x 1. Hgb not much improved overall.  Hopefully will get his second PRBC today at HD  * Acute CVA with left sided weakness, facial droop and slowed speech: Mild sxs all resolved. No antithrombotics due to GIB. Neuro suggests resuming 81 ASA if possible.   * ESRD. HD on MWF.  But, through no fault of his own, has not been dialyzed since Friday.    PLAN   *  Transfuse at least one unit. *  Plan to read CE study in next 48 hours.  Does not need to stay inpt before final reading.   *  OK to resume low dose ASA.    Azucena Freed  07/07/2015, 9:47 AM Pager: [phone]     Attending physician's note   I have taken an interval history, reviewed the chart and examined the patient. I agree with the Advanced Practitioner's note, impression and recommendations. Hematochezia late yesterday.  Hb=7.0 today. HD planned for today with PRBC transfusion. CE study completed-awaiting download and reading.   Pricilla Riffle. Fuller Plan, MD [facility] [phone] pager Mon-Fri 8a-5p [phone] weekends, holidays and 5p-8a or per [facility]

## 2015-07-07 NOTE — Progress Notes (Signed)
Occupational Therapy Treatment Patient Details Name: [name] MRN: [id number] DOB: 03/02/1929 Today's Date: 07/07/2015    History of present illness 81 yo male admitted with GI bleed, syncopal episodes. Hx or ESRD, HD-MWF, HTN, prostate cancer.    OT comments  Pt progressing towards acute OT goals. Focus of session was education with teach back on HEP for LUE and on grooming standing at sink. Session details below. OT to continue to follow acutely.   Follow Up Recommendations  Supervision/Assistance - 24 hour;Home health OT    Equipment Recommendations  3 in 1 bedside comode    Recommendations for Other Services      Precautions / Restrictions Precautions Precautions: Fall Restrictions Weight Bearing Restrictions: No       Mobility Bed Mobility Overal bed mobility: Needs Assistance Bed Mobility: Supine to Sit;Sit to Supine     Supine to sit: Supervision;HOB elevated Sit to supine: Min guard   General bed mobility comments: Pt noted to incorporate LUE into bed mobility with weakness evident. Pt using bed rails and edge of mattress. Extra effort and time. No physical assist.   Transfers Overall transfer level: Needs assistance Equipment used: Rolling walker (2 wheeled) Transfers: Sit to/from Stand Sit to Stand: Min guard         General transfer comment: min  guard for safety from EOB    Balance Overall balance assessment: Needs assistance Sitting-balance support: Feet supported Sitting balance-Leahy Scale: Good     Standing balance support: Bilateral upper extremity supported;During functional activity Standing balance-Leahy Scale: Fair Standing balance comment: stood to complete oral care with occasional external support                   ADL Overall ADL's : Needs assistance/impaired     Grooming: Min guard;Set up;Standing;Oral care;Wash/dry hands                               Functional mobility during ADLs: Min guard;Rolling  walker General ADL Comments: Educated on HEP for LUE strengthening (shoulder shrugs, scapular protraction/retraction, chair pushups, theraputty) and on incorporating LUE into functional activities. Pt completed in-room ambualtion and grooming ar sink as detailed above.      Vision                     Perception     Praxis      Cognition   Behavior During Therapy: WFL for tasks assessed/performed Overall Cognitive Status: Within Functional Limits for tasks assessed                       Extremity/Trunk Assessment               Exercises     Shoulder Instructions       General Comments      Pertinent Vitals/ Pain       Pain Assessment: No/denies pain  Home Living                                          Prior Functioning/Environment              Frequency Min 3X/week     Progress Toward Goals  OT Goals(current goals can now be found in the care plan section)  Progress towards OT goals: Progressing toward  goals  Acute Rehab OT Goals Patient Stated Goal: not stated OT Goal Formulation: With patient Time For Goal Achievement: 07/13/15 Potential to Achieve Goals: Good ADL Goals Pt Will Perform Grooming: with modified independence;standing;sitting Pt Will Perform Upper Body Bathing: with modified independence;sitting Pt Will Perform Lower Body Bathing: with supervision;with adaptive equipment;sit to/from stand Pt Will Perform Upper Body Dressing: with modified independence;sitting Pt Will Perform Lower Body Dressing: with supervision;with adaptive equipment;sit to/from stand Pt Will Transfer to Toilet: with modified independence;ambulating Pt Will Perform Toileting - Clothing Manipulation and hygiene: with modified independence;sitting/lateral leans;sit to/from stand Pt Will Perform Tub/Shower Transfer: Shower transfer;with supervision;ambulating;shower seat;3 in 1 Pt/caregiver will Perform Home Exercise Program: With  written HEP provided  Plan Discharge plan remains appropriate    Co-evaluation                 End of Session Equipment Utilized During Treatment: Rolling walker;Gait belt   Activity Tolerance Patient tolerated treatment well   Patient Left in bed;with call bell/phone within reach;with family/visitor present;with bed alarm set   Nurse Communication          Time: [id number] OT Time Calculation (min): 21 min  Charges: OT General Charges $OT Visit: 1 Procedure OT Treatments $Therapeutic Activity: 8-22 mins  Hortencia Pilar 07/07/2015, 12:19 PM

## 2015-07-07 NOTE — Procedures (Signed)
I have seen and examined this patient and agree with the plan of care . There were no issues with dialysis.  [name] W 07/07/2015, 5:11 PM

## 2015-07-07 NOTE — Progress Notes (Signed)
[facility]  Patient Status: Active (receiving services up to time of hospitalization)  [facility] is providing the following services: RN and PT  If patient discharges after hours, please call [phone].   Consepcion Hearing 07/07/2015, 2:10 PM

## 2015-07-07 NOTE — Progress Notes (Signed)
Report given to dialysis nurse, per MD and note, blood transfusion to be given during dialysis. Blood consent signed, patient was informed.

## 2015-07-07 NOTE — Progress Notes (Signed)
PATIENT DETAILS Name: [name] Age: 81 y.o. Sex: male Date of Birth: 1929-06-03 Admit Date: 07/05/2015 Admitting Physician No admitting provider for patient encounter. [id number] [name], MD  Subjective: 2 episodes of red/black stools overnight.   Assessment/Plan: Principal Problem: Acute CVA: Mild left-sided deficits, Echo negative for embolic source-EF preserved, carotid Doppler negative for significant stenosis. A1c 4.9, LDL 98 (goal less than 70). Resume aspirin in the next few days when no further hematochezia/melanoma and hemoglobin stable  Active Problems: GI bleed: Recently admitted for GI bleed, EGD/colonoscopy on 8/12 was negative, readmitted with melanotic stools and acute blood loss anemia. Continue PPI, capsule endoscopy report pending-hemoglobin down to 7-so far received only 1 unit of PRBC, will get second unit of PRBC today. Although not actively bleeding, suspect some amount of bleeding causing decrease in hemoglobin compared to yesterday.   Acute blood loss anemia: Received 1 unit of PRBC on admission, will receive second unit with dialysis today. Anemia secondary to ESRD at baseline. Follow CBC  Dyslipidemia: LDL 98 (goal less than 70)-continue Crestor  Essential hypertension: Blood pressure currently controlled-resume atenolol in the next few days  Gout: Will continue allopurinol  Depression:Will continue Celexa  ESRD:HD MWF-nephrology following  L upper extrem swelling: Ultrasound Doppler negative DVT, await input from nephrology.  Disposition: Remain inpatient  Antimicrobial agents  See below  Anti-infectives    None      DVT Prophylaxis: SCD's  Code Status: Full code   Family Communication None at bedside  Procedures: None  CONSULTS:  GI, nephrology and neurology  Time spent 30 minutes-Greater than 50% of this time was spent in counseling, explanation of diagnosis, planning of further management, and coordination  of care.  MEDICATIONS: Scheduled Meds: . sodium chloride   Intravenous Once  . acetaminophen  650 mg Oral Once  . allopurinol  100 mg Oral Daily  . calcitRIOL  1.5 mcg Oral Q M,W,F-HD  . citalopram  10 mg Oral Daily  . darbepoetin (ARANESP) injection - DIALYSIS  200 mcg Intravenous Q Wed-HD  . diphenhydrAMINE  25 mg Intravenous Once  . docusate sodium  200 mg Oral BID  . doxercalciferol  1.5 mcg Oral Once per day on Mon Wed Fri  . furosemide  20 mg Intravenous Once  . multivitamin  1 tablet Oral QHS  . pantoprazole  40 mg Oral Q0600  . rosuvastatin  20 mg Oral QHS  . sodium chloride  3 mL Intravenous Q12H   Continuous Infusions:  PRN Meds:.sodium chloride, acetaminophen **OR** acetaminophen, sodium chloride    PHYSICAL EXAM: Vital signs in last 24 hours: Filed Vitals:   07/06/15 2041 07/07/15 0150 07/07/15 0507 07/07/15 1028  BP: 127/57 122/56 127/60 117/47  Pulse: 66 64 69 68  Temp: 98.2 F (36.8 C) 98 F (36.7 C) 98.2 F (36.8 C) 98.3 F (36.8 C)  TempSrc: Oral Oral Oral Oral  Resp: 18 18 20 20   Height:      Weight:      SpO2: 100% 100% 98% 99%    Weight change:  Filed Weights   07/06/15 0908  Weight: 67.132 kg (148 lb)   Body mass index is 22.51 kg/(m^2).   Gen Exam: Awake and alert with clear speech.  Neck: Supple, No JVD.   Chest: B/L Clear.   CVS: S1 S2 Regular, no murmurs.  Abdomen: soft, BS +, non tender, non distended.  Extremities: no edema, lower extremities  warm to touch. Neurologic: Mild left-sided deficits, slight left facial droop Skin: No Rash.   Wounds: N/A.   Intake/Output from previous day:  Intake/Output Summary (Last 24 hours) at 07/07/15 1201 Last data filed at 07/06/15 1839  Gross per 24 hour  Intake      0 ml  Output    250 ml  Net   -250 ml     LAB RESULTS: CBC  Recent Labs Lab 06/30/15 1207 07/05/15 0540 07/05/15 0549 07/06/15 0526 07/07/15 0537  WBC 6.3 6.4  --  6.5 5.8  HGB 8.7* 6.8* 7.1* 7.9* 7.0*  HCT  27.2* 20.6* 21.0* 24.4* 22.0*  PLT 251 244  --  206 198  MCV 94.4 96.7  --  93.5 93.6  MCH 30.2 31.9  --  30.3 29.8  MCHC 32.0 33.0  --  32.4 31.8  RDW 16.1* 17.8*  --  18.7* 18.2*  LYMPHSABS 1.0 1.2  --   --   --   MONOABS 0.6 0.5  --   --   --   EOSABS 0.1 0.1  --   --   --   BASOSABS 0.0 0.0  --   --   --     Chemistries   Recent Labs Lab 06/30/15 1207 07/05/15 0540 07/05/15 0549 07/06/15 0426 07/07/15 0537  NA 138 140 139 136 139  K 3.4* 4.1 4.0 4.0 4.8  CL 94* 99* 102 99* 101  CO2 31 28  --  26 27  GLUCOSE 89 109* 102* 92 93  BUN 7 45* 48* 52* 58*  CREATININE 4.04* 9.02* 8.60* 9.74* 10.58*  CALCIUM 8.5* 8.9  --  8.7* 8.6*    CBG:  Recent Labs Lab 07/06/15 1136 07/06/15 1137 07/06/15 1150  GLUCAP 29* 37* 84    GFR Estimated Creatinine Clearance: 4.9 mL/min (by C-G formula based on Cr of 10.58).  Coagulation profile  Recent Labs Lab 07/05/15 0540  INR 1.23    Cardiac Enzymes No results for input(s): CKMB, TROPONINI, MYOGLOBIN in the last 168 hours.  Invalid input(s): CK  Invalid input(s): POCBNP No results for input(s): DDIMER in the last 72 hours.  Recent Labs  07/05/15 0540  HGBA1C 4.9    Recent Labs  07/05/15 0935  CHOL 188  HDL 65  LDLCALC 98  TRIG 123  CHOLHDL 2.9    Recent Labs  07/05/15 0935  TSH 0.551   No results for input(s): VITAMINB12, FOLATE, FERRITIN, TIBC, IRON, RETICCTPCT in the last 72 hours. No results for input(s): LIPASE, AMYLASE in the last 72 hours.  Urine Studies No results for input(s): UHGB, CRYS in the last 72 hours.  Invalid input(s): UACOL, UAPR, USPG, UPH, UTP, UGL, UKET, UBIL, UNIT, UROB, ULEU, UEPI, UWBC, URBC, UBAC, CAST, UCOM, BILUA  MICROBIOLOGY: Recent Results (from the past 240 hour(s))  Surgical pcr screen     Status: None   Collection Time: 07/05/15 11:58 PM  Result Value Ref Range Status   MRSA, PCR NEGATIVE NEGATIVE Final   Staphylococcus aureus NEGATIVE NEGATIVE Final     Comment:        The Xpert SA Assay (FDA approved for NASAL specimens in patients over 43 years of age), is one component of a comprehensive surveillance program.  Test performance has been validated by [facility] for patients greater than or equal to 71 year old. It is not intended to diagnose infection nor to guide or monitor treatment.     RADIOLOGY STUDIES/RESULTS: Mr Brain Wo Contrast  07/05/2015   CLINICAL DATA:  LEFT facial droop, LEFT upper extremity swelling, and speech difficulty which reportedly began after church on 07/04/2015. There is a history of end-stage renal disease with dialysis.  EXAM: MRI HEAD WITHOUT CONTRAST  TECHNIQUE: Multiplanar, multiecho pulse sequences of the brain and surrounding structures were obtained without intravenous contrast.  COMPARISON:  06/15/2015 MRI.  FINDINGS: Moderate-sized approximate 1 x 1 x 2 cm area of acute infarction affects the subcortical and periventricular white matter of the RIGHT centrum semiovale; this is just superior and posterior to the RIGHT lentiform nucleus. Within limits for detection on MR, no acute hemorrhage.  Advanced atrophy no hydrocephalus or mass lesion. No extra-axial collections. Extensive chronic microvascular ischemic change. Flow voids are maintained in the carotid, basilar, and both vertebral arteries, as well as the RIGHT and LEFT proximal middle cerebral arteries. Partial empty sella. Advanced cervical spondylosis. Retrolisthesis at C3-C4 is redemonstrated. No worrisome osseous findings. No acute sinus or mastoid fluid. Negative orbits.  Compared with prior MR, this infarct was not present and is acute.  IMPRESSION: 1 x 1 x 2 cm area of nonhemorrhagic acute infarction affecting the subcortical and periventricular deep white matter of the RIGHT centrum semiovale. See discussion above.  Chronic changes as described.   Electronically Signed   By: Staci Righter M.D.   On: 07/05/2015 07:31   Mr Brain Wo  Contrast  06/15/2015   CLINICAL DATA:  Syncopal episode while straining for a bowel movement. Rectal bleeding.  EXAM: MRI HEAD WITHOUT CONTRAST  TECHNIQUE: Multiplanar, multiecho pulse sequences of the brain and surrounding structures were obtained without intravenous contrast.  COMPARISON:  MRI brain 06/26/2011.  FINDINGS: The diffusion-weighted images demonstrate no evidence for acute or subacute infarction. Moderate periventricular and subcortical T2 changes are similar to the prior study. Moderate generalized atrophy is again noted. The ventricles are proportionate to the degree of atrophy.  No significant extra-axial fluid collection is present. Flow is present in the major intracranial arteries. The globes and orbits are intact. Mild mucosal thickening is present in the right frontal sinus and anterior ethmoid air cells. The mastoid air cells are clear. The paranasal sinuses are otherwise clear.  The skullbase is within normal limits. Midline intracranial contents are normal.  Moderate spondylosis is present in the upper cervical spine grade 1 retrolisthesis is present at C3-4 with progressive endplate degenerative change. There is chronic loss of disc height at C4-5 and C5-6.  IMPRESSION: 1. No acute intracranial abnormality. 2. Moderate atrophy and white matter disease is similar to the prior study. This likely reflects the sequela of chronic microvascular ischemia. 3. Moderate spondylosis in the upper cervical spine as described.   Electronically Signed   By: San Morelle M.D.   On: 06/15/2015 09:55   Nm Gi Blood Loss  06/16/2015   CLINICAL DATA:  Gastrointestinal bleeding earlier today  EXAM: NUCLEAR MEDICINE GASTROINTESTINAL BLEEDING SCAN  TECHNIQUE: Sequential abdominal and pelvic images were obtained following intravenous administration of Tc-9m labeled red blood cells for a 2 hr time span.  RADIOPHARMACEUTICALS:  24.4 mCi Tc-58m in-vitro labeled red cells.  COMPARISON:  None.  FINDINGS:  There is gastric uptake of radiotracer which is not progressed during this study. There is no abnormal radiotracer uptake in the small or large bowel regions.  IMPRESSION: Increased radiotracer uptake in the stomach raises concern for gastritis. No small bowel or large bowel acute gastrointestinal bleeding focus is identified on this study.   Electronically Signed   By: Gwyndolyn Saxon  Jasmine December III M.D.   On: 06/16/2015 21:09   Nm Bone Scan Whole Body  06/24/2015   CLINICAL DATA:  81 year old with prostate cancer. Patient complains of left-sided pain.  EXAM: NUCLEAR MEDICINE WHOLE BODY BONE SCAN  TECHNIQUE: Whole body anterior and posterior images were obtained approximately 3 hours after intravenous injection of radiopharmaceutical.  RADIOPHARMACEUTICALS:  25 mCi Technetium-33m MDP IV  COMPARISON:  01/02/2005 and 05/06/2015  FINDINGS: There is subtle increased uptake along the right side of the lower lumbar spine near the lumbosacral junction. This is likely degenerative in etiology based on the degenerative changes and scoliosis in the lumbar spine. Changes compatible with a left hip arthroplasty. Slightly increased uptake around the left hip prosthesis is probably related to stress reaction. Mildly increased uptake in both knees are compatible degenerative changes. There is increased uptake along the medial aspect of the right fourth rib. Expected uptake in the renal collecting system. Increased uptake near the cervical-thoracic junction is likely degenerative in etiology. There is increased uptake along the right costovertebral margin at T9.  IMPRESSION: Subtle increased uptake along the medial right fourth rib. This could be degenerative in etiology but a subtle lesion or fracture cannot be excluded. Consider further characterization with chest and rib radiographs.  Scoliosis and degenerative changes in the lumbar spine.  No clear evidence for metastatic bone disease.  Left hip arthroplasty.   Electronically Signed    By: Markus Daft M.D.   On: 06/24/2015 15:58    Oren Binet, MD  [facility] Pager:[phone]  If 7PM-7AM, please contact night-coverage [email or web address] Password [email or web address] 07/07/2015, 12:01 PM   LOS: 2 days

## 2015-07-07 NOTE — Evaluation (Signed)
SLP Cancellation Note  Patient Details Name: [name] MRN: [id number] DOB: 1929-07-26   Cancelled treatment:       Reason Eval/Treat Not Completed: Patient at procedure or test/unavailable   Luanna Salk, [facility] SLP [phone]

## 2015-07-08 ENCOUNTER — Ambulatory Visit: Payer: Medicare Other | Admitting: Gastroenterology

## 2015-07-08 LAB — RENAL FUNCTION PANEL
ALBUMIN: 3.1 g/dL — AB (ref 3.5–5.0)
ANION GAP: 8 (ref 5–15)
BUN: 35 mg/dL — ABNORMAL HIGH (ref 6–20)
CO2: 28 mmol/L (ref 22–32)
Calcium: 8.7 mg/dL — ABNORMAL LOW (ref 8.9–10.3)
Chloride: 102 mmol/L (ref 101–111)
Creatinine, Ser: 7.85 mg/dL — ABNORMAL HIGH (ref 0.61–1.24)
GFR calc non Af Amer: 6 mL/min — ABNORMAL LOW (ref >60)
GFR, EST AFRICAN AMERICAN: 6 mL/min — AB (ref >60)
GLUCOSE: 95 mg/dL (ref 65–99)
PHOSPHORUS: 4.6 mg/dL (ref 2.5–4.6)
POTASSIUM: 4.3 mmol/L (ref 3.5–5.1)
Sodium: 138 mmol/L (ref 135–145)

## 2015-07-08 LAB — CBC
HCT: 24.6 % — ABNORMAL LOW (ref 39.0–52.0)
Hemoglobin: 8 g/dL — ABNORMAL LOW (ref 13.0–17.0)
MCH: 30.2 pg (ref 26.0–34.0)
MCHC: 32.5 g/dL (ref 30.0–36.0)
MCV: 92.8 fL (ref 78.0–100.0)
Platelets: 125 10*3/uL — ABNORMAL LOW (ref 150–400)
RBC: 2.65 MIL/uL — ABNORMAL LOW (ref 4.22–5.81)
RDW: 17.5 % — ABNORMAL HIGH (ref 11.5–15.5)
WBC: 5.9 10*3/uL (ref 4.0–10.5)

## 2015-07-08 LAB — TYPE AND SCREEN
ABO/RH(D): O POS
ANTIBODY SCREEN: NEGATIVE
UNIT DIVISION: 0
Unit division: 0

## 2015-07-08 MED ORDER — POLYETHYLENE GLYCOL 3350 17 G PO PACK
17.0000 g | PACK | ORAL | Status: AC
  Administered 2015-07-08 (×2): 17 g via ORAL
  Filled 2015-07-08 (×2): qty 1

## 2015-07-08 NOTE — Progress Notes (Signed)
Physical Therapy Treatment Patient Details Name: [name] MRN: [id number] DOB: 18-May-1929 Today's Date: 07-14-15    History of Present Illness 81 yo male admitted with GI bleed, syncopal episodes. Hx or ESRD, HD-MWF, HTN, prostate cancer.     PT Comments    Patient progressing with gait and balance; still weak on left and will benefit from follow up HHPT at d/c.  Feel safe with wife supervision and walker for d/c home when medically stable.  Follow Up Recommendations  Home health PT     Equipment Recommendations  None recommended by PT    Recommendations for Other Services       Precautions / Restrictions Precautions Precautions: Fall    Mobility  Bed Mobility         Supine to sit: Modified independent (Device/Increase time) Sit to supine: Supervision   General bed mobility comments: cues for positioning in bed  Transfers Overall transfer level: Needs assistance Equipment used: Rolling walker (2 wheeled) Transfers: Sit to/from Stand Sit to Stand: Min guard         General transfer comment: for safety, then supervision for 5 times for LE strengthening  Ambulation/Gait Ambulation/Gait assistance: Supervision;Min guard Ambulation Distance (Feet): 350 Feet Assistive device: Rolling walker (2 wheeled) Gait Pattern/deviations: Step-through pattern;Decreased stride length     General Gait Details: cues for head turns for visual scanning, noted stiffness in neck; ambulated about 40' without walker min assist for safety and facilitation for arm swing; pt reports feels like left foot dragging   Stairs            Wheelchair Mobility    Modified Rankin (Stroke Patients Only)       Balance Overall balance assessment: Needs assistance         Standing balance support: No upper extremity supported Standing balance-Leahy Scale: Fair Standing balance comment: standing static without UE support, gait with minguard without walker              High level balance activites: Side stepping;Other (comment) High Level Balance Comments: heel raises, alternatig hip abduction all with UE support    Cognition Arousal/Alertness: Awake/alert Behavior During Therapy: WFL for tasks assessed/performed Overall Cognitive Status: Within Functional Limits for tasks assessed                      Exercises      General Comments        Pertinent Vitals/Pain Pain Assessment: No/denies pain    Home Living                      Prior Function            PT Goals (current goals can now be found in the care plan section) Progress towards PT goals: Progressing toward goals    Frequency  Min 3X/week    PT Plan Current plan remains appropriate    Co-evaluation             End of Session Equipment Utilized During Treatment: Gait belt Activity Tolerance: Patient tolerated treatment well Patient left: in bed;with call bell/phone within reach;with family/visitor present     Time: [id number] PT Time Calculation (min) (ACUTE ONLY): 39 min  Charges:  $Gait Training: 8-22 mins $Therapeutic Activity: 8-22 mins $Neuromuscular Re-education: 8-22 mins                    G Codes:      WYNN,CYNDI 07-14-15, 12:03  PM [place], [place] 07/08/2015

## 2015-07-08 NOTE — Progress Notes (Signed)
Daily Rounding Note  07/08/2015, 11:13 AM  LOS: 3 days   SUBJECTIVE:       Very small bowel movement today that looked bloody to the patient. He feels great. He walked and did strengthening exercises with PT and was not weak. He is eating well. He is no pain.  OBJECTIVE:         Vital signs in last 24 hours:    Temp:  [97 F (36.1 C)-98.3 F (36.8 C)] 98 F (36.7 C) (09/01 1107) Pulse Rate:  [63-70] 63 (09/01 1107) Resp:  [14-20] 20 (09/01 1107) BP: (105-149)/(32-110) 114/45 mmHg (09/01 1107) SpO2:  [96 %-100 %] 97 % (09/01 1107) Weight:  [139 lb 15.9 oz (63.5 kg)-143 lb 1.3 oz (64.9 kg)] 139 lb 15.9 oz (63.5 kg) (08/31 1700) Last BM Date: 07/06/15 Filed Weights   07/06/15 0908 07/07/15 1330 07/07/15 1700  Weight: 148 lb (67.132 kg) 143 lb 1.3 oz (64.9 kg) 139 lb 15.9 oz (63.5 kg)   General: Pleasant, looks well, comfortable. Doesn't look ill.   Heart: RRR. No MRG. Chest: Clear to auscultation percussion bilaterally. No dyspnea, no cough. Abdomen: Soft, NT, VD.  Active bowel sounds.  Extremities: No CCE. Neuro/Psych:  Oriented 3. Pleasant. Moves all fours. No gross deficits. In good spirits.  Intake/Output from previous day: 08/31 0701 - 09/01 0700 In: -  Out: 2169   Intake/Output this shift: Total I/O In: 3 [I.V.:3] Out: -   Lab Results:  Recent Labs  07/06/15 0526 07/07/15 0537 07/08/15 0636  WBC 6.5 5.8 5.9  HGB 7.9* 7.0* 8.0*  HCT 24.4* 22.0* 24.6*  PLT 206 198 125*   BMET  Recent Labs  07/06/15 0426 07/07/15 0537 07/08/15 0636  NA 136 139 138  K 4.0 4.8 4.3  CL 99* 101 102  CO2 26 27 28   GLUCOSE 92 93 95  BUN 52* 58* 35*  CREATININE 9.74* 10.58* 7.85*  CALCIUM 8.7* 8.6* 8.7*   LFT  Recent Labs  07/06/15 0426 07/07/15 0537 07/08/15 0636  ALBUMIN 3.2* 3.1* 3.1*   Scheduled Meds: . sodium chloride   Intravenous Once  . allopurinol  100 mg Oral Daily  . calcitRIOL  1.5 mcg  Oral Q M,W,F-HD  . citalopram  10 mg Oral Daily  . darbepoetin (ARANESP) injection - DIALYSIS  200 mcg Intravenous Q Wed-HD  . diphenhydrAMINE  25 mg Intravenous Once  . docusate sodium  200 mg Oral BID  . doxercalciferol  1.5 mcg Oral Once per day on Mon Wed Fri  . furosemide  20 mg Intravenous Once  . multivitamin  1 tablet Oral QHS  . pantoprazole  40 mg Oral Q0600  . rosuvastatin  20 mg Oral QHS  . sodium chloride  3 mL Intravenous Q12H   Continuous Infusions:  PRN Meds:.sodium chloride, sodium chloride, sodium chloride, acetaminophen **OR** acetaminophen, alteplase, feeding supplement (NEPRO CARB STEADY), heparin, lidocaine (PF), lidocaine-prilocaine, pentafluoroprop-tetrafluoroeth, sodium chloride   ASSESMENT:   * FOBT +, intermittent dark stools as well as hematochezia. Hematochezia/anemia work up 3 weeks ago (EGD/flex sig/colonoscopy/Nuc RBC scan) with diagnosis of diverticular bleed. Non-erosive gastritis on EGD, not on PPI or H2 blocker at discharge but Protonix started yesterday.  Pt could also have SB AVMs  Capsule endoscopy completed 07/06/15 but not yet read.  * ABL and chronic disease anemia. On Mircera. S/p PRBCs x 2. Hgb improved.   * Acute CVA with left sided weakness, facial droop and  slowed speech: Mild sxs all resolved. No antithrombotics due to GIB. Neuro suggests resuming 81 ASA if possible.   * ESRD. HD on MWF.  Dialysis schedule altered since admission, was dialyzed yesterday at which time he received a second unit of PRBC.    PLAN   *  Await reading of capsule endoscopy. Hopefully will be completed by the end of today.  *  Given what sounds like possibly fresh (versus old) blood, probably best to observe him 1 more night.   *  We'll order some doses of MiraLAX to try to relieve him of any retained old blood. He tends to be constipated anyway.  *  I would transfuse 1 additional unit PRBC given liklihood of recurrent anemia.   *  For now don't  restart oral iron because it's just come to confuse the picture as to whether or not he has ongoing bleeding.   Azucena Freed  07/08/2015, 11:13 AM Pager: [phone]     Attending physician's note   I have taken an interval history, reviewed the chart and examined the patient. I agree with the Advanced Practitioner's note, impression and recommendations. Small amount of rectal bleeding noted.  Capsule endoscopy showed: no abnormalities or blood in the SB or cecum. Blood noted in the colon distal to the cecum, site not localized.  A stuttering diverticular bleed is the working diagnosis. Continue to monitor in hospital and follow CBC.   Pricilla Riffle. Fuller Plan, MD [facility] [phone] pager Mon-Fri 8a-5p [phone] weekends, holidays and 5p-8a or per [facility]

## 2015-07-08 NOTE — Progress Notes (Signed)
PATIENT DETAILS Name: [name] Age: 81 y.o. Sex: male Date of Birth: April 26, 1929 Admit Date: 07/05/2015 Admitting Physician No admitting provider for patient encounter. [id number] [name], MD  Subjective: 1 small BM this am-that looked black/bloody-no other complaints  Assessment/Plan: Principal Problem: Acute CVA: Mild left-sided deficits, Echo negative for embolic source-EF preserved, carotid Doppler negative for significant stenosis. A1c 4.9, LDL 98 (goal less than 70). Resume aspirin in the next few days when no further hematochezia/melanoma and hemoglobin stable  Active Problems: GI bleed: Recently admitted for GI bleed, EGD/colonoscopy on 8/12 was negative, readmitted with melanotic stools and acute blood loss anemia. Continue PPI, capsule endoscopy report pending-hemoglobin stable at 8 after 2 units of PRBC. Follow   Acute blood loss anemia: Received 2 units of PRBC. Anemia secondary to ESRD at baseline. Follow CBC  Dyslipidemia: LDL 98 (goal less than 70)-continue Crestor  Essential hypertension: Blood pressure currently controlled-resume atenolol in the next few days  Gout: Will continue allopurinol  Depression:Will continue Celexa  ESRD:HD MWF-nephrology following  L upper extrem swelling: Ultrasound Doppler negative DVT, await input from nephrology.  Disposition: Remain inpatient-home when no longer bleeding and Hb stable  Antimicrobial agents  See below  Anti-infectives    None      DVT Prophylaxis: SCD's  Code Status: Full code   Family Communication None at bedside  Procedures: None  CONSULTS:  GI, nephrology and neurology  Time spent 30 minutes-Greater than 50% of this time was spent in counseling, explanation of diagnosis, planning of further management, and coordination of care.  MEDICATIONS: Scheduled Meds: . sodium chloride   Intravenous Once  . allopurinol  100 mg Oral Daily  . calcitRIOL  1.5 mcg Oral Q M,W,F-HD   . citalopram  10 mg Oral Daily  . darbepoetin (ARANESP) injection - DIALYSIS  200 mcg Intravenous Q Wed-HD  . diphenhydrAMINE  25 mg Intravenous Once  . docusate sodium  200 mg Oral BID  . doxercalciferol  1.5 mcg Oral Once per day on Mon Wed Fri  . furosemide  20 mg Intravenous Once  . multivitamin  1 tablet Oral QHS  . pantoprazole  40 mg Oral Q0600  . polyethylene glycol  17 g Oral Q2H  . rosuvastatin  20 mg Oral QHS  . sodium chloride  3 mL Intravenous Q12H   Continuous Infusions:  PRN Meds:.sodium chloride, sodium chloride, sodium chloride, acetaminophen **OR** acetaminophen, alteplase, feeding supplement (NEPRO CARB STEADY), heparin, lidocaine (PF), lidocaine-prilocaine, pentafluoroprop-tetrafluoroeth, sodium chloride    PHYSICAL EXAM: Vital signs in last 24 hours: Filed Vitals:   07/07/15 2145 07/08/15 0120 07/08/15 0505 07/08/15 1107  BP: 105/45 115/54 124/56 114/45  Pulse: 69 68 69 63  Temp: 98.2 F (36.8 C) 98.3 F (36.8 C) 98.2 F (36.8 C) 98 F (36.7 C)  TempSrc: Oral Oral Oral Oral  Resp: 16 16 16 20   Height:      Weight:      SpO2: 99% 100% 100% 97%    Weight change: -2.232 kg (-4 lb 14.7 oz) Filed Weights   07/06/15 0908 07/07/15 1330 07/07/15 1700  Weight: 67.132 kg (148 lb) 64.9 kg (143 lb 1.3 oz) 63.5 kg (139 lb 15.9 oz)   Body mass index is 21.29 kg/(m^2).   Gen Exam: Awake and alert with clear speech.  Neck: Supple, No JVD.   Chest: B/L Clear.   CVS: S1 S2 Regular, no murmurs.  Abdomen:  soft, BS +, non tender, non distended.  Extremities: no edema, lower extremities warm to touch. Neurologic: Mild left-sided deficits, slight left facial droop Skin: No Rash.   Wounds: N/A.   Intake/Output from previous day:  Intake/Output Summary (Last 24 hours) at 07/08/15 1156 Last data filed at 07/08/15 1039  Gross per 24 hour  Intake      3 ml  Output   2169 ml  Net  -2166 ml     LAB RESULTS: CBC  Recent Labs Lab 07/05/15 0540 07/05/15 0549  07/06/15 0526 07/07/15 0537 07/08/15 0636  WBC 6.4  --  6.5 5.8 5.9  HGB 6.8* 7.1* 7.9* 7.0* 8.0*  HCT 20.6* 21.0* 24.4* 22.0* 24.6*  PLT 244  --  206 198 125*  MCV 96.7  --  93.5 93.6 92.8  MCH 31.9  --  30.3 29.8 30.2  MCHC 33.0  --  32.4 31.8 32.5  RDW 17.8*  --  18.7* 18.2* 17.5*  LYMPHSABS 1.2  --   --   --   --   MONOABS 0.5  --   --   --   --   EOSABS 0.1  --   --   --   --   BASOSABS 0.0  --   --   --   --     Chemistries   Recent Labs Lab 07/05/15 0540 07/05/15 0549 07/06/15 0426 07/07/15 0537 07/08/15 0636  NA 140 139 136 139 138  K 4.1 4.0 4.0 4.8 4.3  CL 99* 102 99* 101 102  CO2 28  --  26 27 28   GLUCOSE 109* 102* 92 93 95  BUN 45* 48* 52* 58* 35*  CREATININE 9.02* 8.60* 9.74* 10.58* 7.85*  CALCIUM 8.9  --  8.7* 8.6* 8.7*    CBG:  Recent Labs Lab 07/06/15 1136 07/06/15 1137 07/06/15 1150  GLUCAP 29* 37* 84    GFR Estimated Creatinine Clearance: 6.3 mL/min (by C-G formula based on Cr of 7.85).  Coagulation profile  Recent Labs Lab 07/05/15 0540  INR 1.23    Cardiac Enzymes No results for input(s): CKMB, TROPONINI, MYOGLOBIN in the last 168 hours.  Invalid input(s): CK  Invalid input(s): POCBNP No results for input(s): DDIMER in the last 72 hours. No results for input(s): HGBA1C in the last 72 hours. No results for input(s): CHOL, HDL, LDLCALC, TRIG, CHOLHDL, LDLDIRECT in the last 72 hours. No results for input(s): TSH, T4TOTAL, T3FREE, THYROIDAB in the last 72 hours.  Invalid input(s): FREET3 No results for input(s): VITAMINB12, FOLATE, FERRITIN, TIBC, IRON, RETICCTPCT in the last 72 hours. No results for input(s): LIPASE, AMYLASE in the last 72 hours.  Urine Studies No results for input(s): UHGB, CRYS in the last 72 hours.  Invalid input(s): UACOL, UAPR, USPG, UPH, UTP, UGL, UKET, UBIL, UNIT, UROB, ULEU, UEPI, UWBC, URBC, UBAC, CAST, UCOM, BILUA  MICROBIOLOGY: Recent Results (from the past 240 hour(s))  Surgical pcr screen      Status: None   Collection Time: 07/05/15 11:58 PM  Result Value Ref Range Status   MRSA, PCR NEGATIVE NEGATIVE Final   Staphylococcus aureus NEGATIVE NEGATIVE Final    Comment:        The Xpert SA Assay (FDA approved for NASAL specimens in patients over 85 years of age), is one component of a comprehensive surveillance program.  Test performance has been validated by [facility] for patients greater than or equal to 52 year old. It is not intended to diagnose infection  nor to guide or monitor treatment.     RADIOLOGY STUDIES/RESULTS: Mr Herby Abraham Contrast  07/05/2015   CLINICAL DATA:  LEFT facial droop, LEFT upper extremity swelling, and speech difficulty which reportedly began after church on 07/04/2015. There is a history of end-stage renal disease with dialysis.  EXAM: MRI HEAD WITHOUT CONTRAST  TECHNIQUE: Multiplanar, multiecho pulse sequences of the brain and surrounding structures were obtained without intravenous contrast.  COMPARISON:  06/15/2015 MRI.  FINDINGS: Moderate-sized approximate 1 x 1 x 2 cm area of acute infarction affects the subcortical and periventricular white matter of the RIGHT centrum semiovale; this is just superior and posterior to the RIGHT lentiform nucleus. Within limits for detection on MR, no acute hemorrhage.  Advanced atrophy no hydrocephalus or mass lesion. No extra-axial collections. Extensive chronic microvascular ischemic change. Flow voids are maintained in the carotid, basilar, and both vertebral arteries, as well as the RIGHT and LEFT proximal middle cerebral arteries. Partial empty sella. Advanced cervical spondylosis. Retrolisthesis at C3-C4 is redemonstrated. No worrisome osseous findings. No acute sinus or mastoid fluid. Negative orbits.  Compared with prior MR, this infarct was not present and is acute.  IMPRESSION: 1 x 1 x 2 cm area of nonhemorrhagic acute infarction affecting the subcortical and periventricular deep white matter of the RIGHT  centrum semiovale. See discussion above.  Chronic changes as described.   Electronically Signed   By: Staci Righter M.D.   On: 07/05/2015 07:31   Mr Brain Wo Contrast  06/15/2015   CLINICAL DATA:  Syncopal episode while straining for a bowel movement. Rectal bleeding.  EXAM: MRI HEAD WITHOUT CONTRAST  TECHNIQUE: Multiplanar, multiecho pulse sequences of the brain and surrounding structures were obtained without intravenous contrast.  COMPARISON:  MRI brain 06/26/2011.  FINDINGS: The diffusion-weighted images demonstrate no evidence for acute or subacute infarction. Moderate periventricular and subcortical T2 changes are similar to the prior study. Moderate generalized atrophy is again noted. The ventricles are proportionate to the degree of atrophy.  No significant extra-axial fluid collection is present. Flow is present in the major intracranial arteries. The globes and orbits are intact. Mild mucosal thickening is present in the right frontal sinus and anterior ethmoid air cells. The mastoid air cells are clear. The paranasal sinuses are otherwise clear.  The skullbase is within normal limits. Midline intracranial contents are normal.  Moderate spondylosis is present in the upper cervical spine grade 1 retrolisthesis is present at C3-4 with progressive endplate degenerative change. There is chronic loss of disc height at C4-5 and C5-6.  IMPRESSION: 1. No acute intracranial abnormality. 2. Moderate atrophy and white matter disease is similar to the prior study. This likely reflects the sequela of chronic microvascular ischemia. 3. Moderate spondylosis in the upper cervical spine as described.   Electronically Signed   By: San Morelle M.D.   On: 06/15/2015 09:55   Nm Gi Blood Loss  06/16/2015   CLINICAL DATA:  Gastrointestinal bleeding earlier today  EXAM: NUCLEAR MEDICINE GASTROINTESTINAL BLEEDING SCAN  TECHNIQUE: Sequential abdominal and pelvic images were obtained following intravenous  administration of Tc-63m labeled red blood cells for a 2 hr time span.  RADIOPHARMACEUTICALS:  24.4 mCi Tc-61m in-vitro labeled red cells.  COMPARISON:  None.  FINDINGS: There is gastric uptake of radiotracer which is not progressed during this study. There is no abnormal radiotracer uptake in the small or large bowel regions.  IMPRESSION: Increased radiotracer uptake in the stomach raises concern for gastritis. No small bowel or large bowel  acute gastrointestinal bleeding focus is identified on this study.   Electronically Signed   By: Lowella Grip III M.D.   On: 06/16/2015 21:09   Nm Bone Scan Whole Body  06/24/2015   CLINICAL DATA:  81 year old with prostate cancer. Patient complains of left-sided pain.  EXAM: NUCLEAR MEDICINE WHOLE BODY BONE SCAN  TECHNIQUE: Whole body anterior and posterior images were obtained approximately 3 hours after intravenous injection of radiopharmaceutical.  RADIOPHARMACEUTICALS:  25 mCi Technetium-66m MDP IV  COMPARISON:  01/02/2005 and 05/06/2015  FINDINGS: There is subtle increased uptake along the right side of the lower lumbar spine near the lumbosacral junction. This is likely degenerative in etiology based on the degenerative changes and scoliosis in the lumbar spine. Changes compatible with a left hip arthroplasty. Slightly increased uptake around the left hip prosthesis is probably related to stress reaction. Mildly increased uptake in both knees are compatible degenerative changes. There is increased uptake along the medial aspect of the right fourth rib. Expected uptake in the renal collecting system. Increased uptake near the cervical-thoracic junction is likely degenerative in etiology. There is increased uptake along the right costovertebral margin at T9.  IMPRESSION: Subtle increased uptake along the medial right fourth rib. This could be degenerative in etiology but a subtle lesion or fracture cannot be excluded. Consider further characterization with chest and  rib radiographs.  Scoliosis and degenerative changes in the lumbar spine.  No clear evidence for metastatic bone disease.  Left hip arthroplasty.   Electronically Signed   By: Markus Daft M.D.   On: 06/24/2015 15:58    Oren Binet, MD  [facility] Pager:[phone]  If 7PM-7AM, please contact night-coverage [email or web address] Password [email or web address] 07/08/2015, 11:56 AM   LOS: 3 days

## 2015-07-08 NOTE — Evaluation (Signed)
Speech Language Pathology Evaluation Patient Details Name: [name] MRN: [id number] DOB: 12-02-1928 Today's Date: 07/08/2015 Time: [id number] SLP Time Calculation (min) (ACUTE ONLY): 27 min  Problem List:  Patient Active Problem List   Diagnosis Date Noted  . Bleeding gastrointestinal   . History of GI bleed   . HLD (hyperlipidemia)   . Stroke 07/05/2015  . Anemia, blood loss   . Benign neoplasm of descending colon   . Gastritis   . Syncope 06/15/2015  . Acute GI bleeding 06/15/2015  . Acute blood loss anemia 06/15/2015  . Diverticulosis of colon with hemorrhage   . Pulse irregularity 05/06/2015  . Lower back pain 03/26/2014  . Depression 03/26/2014  . Hematochezia 03/14/2013  . End stage renal disease 09/17/2012  . Dizziness 07/27/2012  . Complex renal cyst 06/19/2011  . Left arm weakness 06/19/2011  . Preventative health care 06/17/2011  . SHOULDER PAIN, LEFT 06/03/2010  . Hyperlipemia 03/09/2008  . Gout 03/09/2008  . Iron deficiency anemia 03/09/2008  . Essential hypertension 03/09/2008  . DIVERTICULOSIS, COLON 03/09/2008  . FATIGUE 03/09/2008  . PROSTATE CANCER, HX OF 03/09/2008   Past Medical History:  Past Medical History  Diagnosis Date  . ANEMIA-IRON DEFICIENCY 03/09/2008  . DIVERTICULOSIS, COLON 03/09/2008  . GOUT 03/09/2008  . HYPERLIPIDEMIA 03/09/2008  . HYPERTENSION 03/09/2008  . PROSTATE CANCER, HX OF 03/09/2008  . ESRD (end stage renal disease) 03/09/2008  . Complex renal cyst 06/19/2011  . Hyperparathyroidism   . Prostate cancer 2002    Completed external beam radiation 2003.per HPI  . Arthritis     cervical spine.   Marland Kitchen History of blood transfusion   . Depression 03/26/2014  . Hemorrhoids 08/2009    internal.   . Radiation cystitis 2010.   Past Surgical History:  Past Surgical History  Procedure Laterality Date  . Tonsillectomy    . Rotator cuff repair right  4/08  . S/p left hip replacement  2007    Dr. Percell Miller ortho  . Joint replacement      HIP   . Av fistula placement Left 01/24/2013    Procedure: INSERTION OF ARTERIOVENOUS (AV) GORE-TEX GRAFT ARM;  Surgeon: Rosetta Posner, MD;  Location: [place];  Service: Vascular;  Laterality: Left;  . Flexible sigmoidoscopy N/A 06/15/2015    Procedure: FLEXIBLE SIGMOIDOSCOPY;  Surgeon: Gatha Mayer, MD;  Location: [place];  Service: Endoscopy;  Laterality: N/A;  . Colonoscopy N/A 06/17/2015    Procedure: COLONOSCOPY;  Surgeon: Gatha Mayer, MD;  Location: [place];  Service: Endoscopy;  Laterality: N/A;  . Esophagogastroduodenoscopy N/A 06/17/2015    Procedure: ESOPHAGOGASTRODUODENOSCOPY (EGD);  Surgeon: Gatha Mayer, MD;  Location: [facility] ENDOSCOPY;  Service: Endoscopy;  Laterality: N/A;   HPI:  81 year old male admitted with GI bleed and syncopal episodes. Hx or ESRD, HD-MWF, HTN, prostate cancer.  MRI revealed 1 x 1 x 2 cm area of nonhemorrhagic acute infarction affecting the subcortical and periventricular deep white matter of the right centrum semiovale.   Assessment / Plan / Recommendation Clinical Impression  Patient presents with mild left sided labial and lingual weakness that impacts clear production of consonants in sentence level expression.  Cues for over articulation effectively elicited more intelligible speech, which SLP educated both patient and wife of.  Patient also demonstrates moderate deficits in delayed recall, mild word retrieval deficits and mild left inattention (especially of left upper extremity).  Wife confirmed that retrieval difficulty was premorbid but inattention was not.  SLP  educated wife on effective strategies to target left attention.  Patient does not present with deficits that require acute SLP services; however, given level of independence prior to admission follow up SLP are recommended to maximize independence and reduce overall burden of care following discharge; patient and wife in agreement.     SLP Assessment  All further Speech Lanaguage Pathology   needs can be addressed in the next venue of care    Follow Up Recommendations  24 hour supervision/assistance;Home health SLP            Pertinent Vitals/Pain Pain Assessment: No/denies pain   SLP Goals  Progression toward goals:  (Eval ) Patient/Family Stated Goal: to bowl and drive again   SLP Evaluation Prior Functioning  Cognitive/Linguistic Baseline: Baseline deficits Baseline deficit details: memory per wife Type of Home: House  Lives With: Spouse Available Help at Discharge: Family;Available 24 hours/day Vocation: Retired   Associate Professor  Overall Cognitive Status: History of cognitive impairments - at baseline Orientation Level: Oriented X4 Safety/Judgment: Impaired    Comprehension  Auditory Comprehension Overall Auditory Comprehension: Appears within functional limits for tasks assessed Visual Recognition/Discrimination Discrimination: Exceptions to [facility] (left inattention ) Reading Comprehension Reading Status: Within funtional limits    Expression Expression Primary Mode of Expression: Verbal Verbal Expression Overall Verbal Expression: Impaired at baseline Other Verbal Expression Comments: word retreival deficits    Oral / Motor Oral Motor/Sensory Function Overall Oral Motor/Sensory Function: Impaired Labial ROM: Reduced left Labial Symmetry: Abnormal symmetry left Labial Strength: Reduced Labial Sensation: Within Functional Limits Lingual ROM: Within Functional Limits Lingual Symmetry: Within Functional Limits Lingual Strength: Within Functional Limits Facial ROM: Reduced left Facial Symmetry: Within Functional Limits Facial Strength: Within Functional Limits Facial Sensation: Within Functional Limits Velum: Within Functional Limits Mandible: Within Functional Limits Motor Speech Overall Motor Speech: Impaired Respiration: Within functional limits Phonation: Normal Resonance: Within functional limits Articulation: Impaired Level of Impairment:  Sentence Intelligibility: Intelligibility reduced Word: 75-100% accurate Phrase: 75-100% accurate Sentence: 75-100% accurate Conversation: Not tested Motor Planning: Witnin functional limits Motor Speech Errors: Not applicable Effective Techniques: Over-articulate   GO    Carmelia Roller., CCC-SLP [phone]  [place] 07/08/2015, 4:28 PM

## 2015-07-08 NOTE — Care Management Important Message (Signed)
Important Message  Patient Details  Name: [name] MRN: [id number] Date of Birth: 1929/02/03   Medicare Important Message Given:  Yes-second notification given    Delorse Lek 07/08/2015, 12:25 PM

## 2015-07-08 NOTE — Progress Notes (Signed)
[facility] ROUNDING NOTE   Subjective:   Interval History: no complaints  Objective:  Vital signs in last 24 hours:  Temp:  [97 F (36.1 C)-98.3 F (36.8 C)] 98 F (36.7 C) (09/01 1349) Pulse Rate:  [63-70] 68 (09/01 1349) Resp:  [14-20] 20 (09/01 1349) BP: (105-139)/(32-59) 119/45 mmHg (09/01 1349) SpO2:  [97 %-100 %] 98 % (09/01 1349) Weight:  [63.5 kg (139 lb 15.9 oz)] 63.5 kg (139 lb 15.9 oz) (08/31 1700)  Weight change: -2.232 kg (-4 lb 14.7 oz) Filed Weights   07/06/15 0908 07/07/15 1330 07/07/15 1700  Weight: 67.132 kg (148 lb) 64.9 kg (143 lb 1.3 oz) 63.5 kg (139 lb 15.9 oz)    Intake/Output: I/O last 3 completed shifts: In: -  Out: 2169 [Other:2169]   Intake/Output this shift:  Total I/O In: 3 [I.V.:3] Out: -   CVS- RRR RS- CTA ABD- BS present soft non-distended EXT- no edema   Basic Metabolic Panel:  Recent Labs Lab 07/05/15 0540 07/05/15 0549 07/06/15 0426 07/07/15 0537 07/08/15 0636  NA 140 139 136 139 138  K 4.1 4.0 4.0 4.8 4.3  CL 99* 102 99* 101 102  CO2 28  --  26 27 28   GLUCOSE 109* 102* 92 93 95  BUN 45* 48* 52* 58* 35*  CREATININE 9.02* 8.60* 9.74* 10.58* 7.85*  CALCIUM 8.9  --  8.7* 8.6* 8.7*  PHOS  --   --  4.3 5.2* 4.6    Liver Function Tests:  Recent Labs Lab 07/05/15 0540 07/06/15 0426 07/07/15 0537 07/08/15 0636  AST 22  --   --   --   ALT 12*  --   --   --   ALKPHOS 51  --   --   --   BILITOT 0.3  --   --   --   PROT 5.9*  --   --   --   ALBUMIN 3.5 3.2* 3.1* 3.1*   No results for input(s): LIPASE, AMYLASE in the last 168 hours. No results for input(s): AMMONIA in the last 168 hours.  CBC:  Recent Labs Lab 07/05/15 0540 07/05/15 0549 07/06/15 0526 07/07/15 0537 07/08/15 0636  WBC 6.4  --  6.5 5.8 5.9  NEUTROABS 4.4  --   --   --   --   HGB 6.8* 7.1* 7.9* 7.0* 8.0*  HCT 20.6* 21.0* 24.4* 22.0* 24.6*  MCV 96.7  --  93.5 93.6 92.8  PLT 244  --  206 198 125*    Cardiac Enzymes: No  results for input(s): CKTOTAL, CKMB, CKMBINDEX, TROPONINI in the last 168 hours.  BNP: Invalid input(s): POCBNP  CBG:  Recent Labs Lab 07/06/15 1136 07/06/15 1137 07/06/15 1150  GLUCAP 29* 47* 73    Microbiology: Results for orders placed or performed during the hospital encounter of 07/05/15  Surgical pcr screen     Status: None   Collection Time: 07/05/15 11:58 PM  Result Value Ref Range Status   MRSA, PCR NEGATIVE NEGATIVE Final   Staphylococcus aureus NEGATIVE NEGATIVE Final    Comment:        The Xpert SA Assay (FDA approved for NASAL specimens in patients over 65 years of age), is one component of a comprehensive surveillance program.  Test performance has been validated by [facility] for patients greater than or equal to 65 year old. It is not intended to diagnose infection nor to guide or monitor treatment.     Coagulation Studies: No results  for input(s): LABPROT, INR in the last 72 hours.  Urinalysis:  Recent Labs  07/06/15 0003  COLORURINE YELLOW  LABSPEC 1.014  PHURINE 7.0  GLUCOSEU NEGATIVE  HGBUR NEGATIVE  BILIRUBINUR NEGATIVE  KETONESUR NEGATIVE  PROTEINUR 100*  UROBILINOGEN 0.2  NITRITE NEGATIVE  LEUKOCYTESUR NEGATIVE      Imaging: No results found.   Medications:     . sodium chloride   Intravenous Once  . allopurinol  100 mg Oral Daily  . calcitRIOL  1.5 mcg Oral Q M,W,F-HD  . citalopram  10 mg Oral Daily  . darbepoetin (ARANESP) injection - DIALYSIS  200 mcg Intravenous Q Wed-HD  . diphenhydrAMINE  25 mg Intravenous Once  . docusate sodium  200 mg Oral BID  . doxercalciferol  1.5 mcg Oral Once per day on Mon Wed Fri  . furosemide  20 mg Intravenous Once  . multivitamin  1 tablet Oral QHS  . pantoprazole  40 mg Oral Q0600  . polyethylene glycol  17 g Oral Q2H  . rosuvastatin  20 mg Oral QHS  . sodium chloride  3 mL Intravenous Q12H   sodium chloride, sodium chloride, sodium chloride, acetaminophen **OR**  acetaminophen, alteplase, feeding supplement (NEPRO CARB STEADY), heparin, lidocaine (PF), lidocaine-prilocaine, pentafluoroprop-tetrafluoroeth, sodium chloride  Assessment/ Plan:   ESRD- Hd MWF  ANEMIA- chronic anemia with unrevealing work up  MBD- stable  HTN/VOL- controlled  ACCESS- AVF no issues   LOS: 3 [place] Bair W @TODAY @1 :57 PM

## 2015-07-09 ENCOUNTER — Encounter (HOSPITAL_COMMUNITY): Payer: Self-pay | Admitting: Gastroenterology

## 2015-07-09 LAB — RENAL FUNCTION PANEL
ALBUMIN: 3 g/dL — AB (ref 3.5–5.0)
Anion gap: 10 (ref 5–15)
BUN: 36 mg/dL — AB (ref 6–20)
CO2: 26 mmol/L (ref 22–32)
CREATININE: 9.3 mg/dL — AB (ref 0.61–1.24)
Calcium: 8.6 mg/dL — ABNORMAL LOW (ref 8.9–10.3)
Chloride: 100 mmol/L — ABNORMAL LOW (ref 101–111)
GFR, EST AFRICAN AMERICAN: 5 mL/min — AB (ref >60)
GFR, EST NON AFRICAN AMERICAN: 5 mL/min — AB (ref >60)
Glucose, Bld: 96 mg/dL (ref 65–99)
PHOSPHORUS: 5.3 mg/dL — AB (ref 2.5–4.6)
POTASSIUM: 4.1 mmol/L (ref 3.5–5.1)
Sodium: 136 mmol/L (ref 135–145)

## 2015-07-09 LAB — PREPARE RBC (CROSSMATCH)

## 2015-07-09 LAB — CBC
HEMATOCRIT: 24.9 % — AB (ref 39.0–52.0)
Hemoglobin: 7.9 g/dL — ABNORMAL LOW (ref 13.0–17.0)
MCH: 29.7 pg (ref 26.0–34.0)
MCHC: 31.7 g/dL (ref 30.0–36.0)
MCV: 93.6 fL (ref 78.0–100.0)
PLATELETS: 125 10*3/uL — AB (ref 150–400)
RBC: 2.66 MIL/uL — AB (ref 4.22–5.81)
RDW: 17.3 % — AB (ref 11.5–15.5)
WBC: 5.5 10*3/uL (ref 4.0–10.5)

## 2015-07-09 LAB — GLUCOSE, CAPILLARY: GLUCOSE-CAPILLARY: 29 mg/dL — AB (ref 65–99)

## 2015-07-09 MED ORDER — DIPHENHYDRAMINE HCL 50 MG/ML IJ SOLN
25.0000 mg | Freq: Once | INTRAMUSCULAR | Status: AC
  Administered 2015-07-09: 25 mg via INTRAVENOUS
  Filled 2015-07-09: qty 1

## 2015-07-09 MED ORDER — HEPARIN SODIUM (PORCINE) 1000 UNIT/ML DIALYSIS: 1000.0000 [IU] | INTRAMUSCULAR | Status: DC | PRN

## 2015-07-09 MED ORDER — PENTAFLUOROPROP-TETRAFLUOROETH EX AERO: 1.0000 "application " | INHALATION_SPRAY | CUTANEOUS | Status: DC | PRN

## 2015-07-09 MED ORDER — SODIUM CHLORIDE 0.9 % IV SOLN
Freq: Once | INTRAVENOUS | Status: AC
  Administered 2015-07-09: 1000 mL via INTRAVENOUS

## 2015-07-09 MED ORDER — SODIUM CHLORIDE 0.9 % IV SOLN: 100.0000 mL | INTRAVENOUS | Status: DC | PRN

## 2015-07-09 MED ORDER — LIDOCAINE HCL (PF) 1 % IJ SOLN: 5.0000 mL | INTRAMUSCULAR | Status: DC | PRN

## 2015-07-09 MED ORDER — ALTEPLASE 2 MG IJ SOLR: 2.0000 mg | Freq: Once | INTRAMUSCULAR | Status: DC | PRN

## 2015-07-09 MED ORDER — ACETAMINOPHEN 325 MG PO TABS
650.0000 mg | ORAL_TABLET | Freq: Once | ORAL | Status: AC
  Administered 2015-07-09: 650 mg via ORAL
  Filled 2015-07-09: qty 2

## 2015-07-09 MED ORDER — LIDOCAINE-PRILOCAINE 2.5-2.5 % EX CREA: 1.0000 "application " | TOPICAL_CREAM | CUTANEOUS | Status: DC | PRN

## 2015-07-09 NOTE — Progress Notes (Signed)
PATIENT DETAILS Name: [name] Age: 81 y.o. Sex: male Date of Birth: 1929/08/05 Admit Date: 07/05/2015 Admitting Physician No admitting provider for patient encounter. [id number] [name], MD  Subjective: No BM since yesterday morning. No complaints.  Assessment/Plan: Principal Problem: Acute CVA: Mild left-sided deficits, Echo negative for embolic source-EF preserved, carotid Doppler negative for significant stenosis. A1c 4.9, LDL 98 (goal less than 70). Resume aspirin tomorrow if continues to have no further overt GI bleeding.   Active Problems: GI bleed: Recently admitted for GI bleed, EGD/colonoscopy on 8/12 was negative, readmitted with melanotic stools and acute blood loss anemia. Capsule Endoscopy negative as well. No further bleeding for the past 24 hours, hemoglobin are relatively stable compared to yesterday. We will watch one additional day, if no further bleeding-suspect stable for discharge. Per GI, suspect diverticular bleeding as the etiology.  Acute blood loss anemia: Received 2 units of PRBC so far, hemoglobin is stable at 7.9-however has had a recent CVA and will try and get him up more than 8 in case he has another recurrence of bleeding-therefore transfused 1 additional unit today. Anemia secondary to ESRD at baseline. Follow CBC  Dyslipidemia: LDL 98 (goal less than 70)-continue Crestor  Essential hypertension: Blood pressure currently controlled-resume atenolol in the next few days  Gout: Will continue allopurinol  Depression:Will continue Celexa  ESRD:HD MWF-nephrology following  L upper extrem swelling: Ultrasound Doppler negative DVT  Disposition: Remain inpatient-home 9/3 if no further bleeding  Antimicrobial agents  See below  Anti-infectives    None      DVT Prophylaxis: SCD's  Code Status: Full code   Family Communication None at bedside  Procedures: None  CONSULTS:  GI, nephrology and neurology  Time spent 30  minutes-Greater than 50% of this time was spent in counseling, explanation of diagnosis, planning of further management, and coordination of care.  MEDICATIONS: Scheduled Meds: . sodium chloride   Intravenous Once  . allopurinol  100 mg Oral Daily  . calcitRIOL  1.5 mcg Oral Q M,W,F-HD  . citalopram  10 mg Oral Daily  . darbepoetin (ARANESP) injection - DIALYSIS  200 mcg Intravenous Q Wed-HD  . diphenhydrAMINE  25 mg Intravenous Once  . docusate sodium  200 mg Oral BID  . doxercalciferol  1.5 mcg Oral Once per day on Mon Wed Fri  . multivitamin  1 tablet Oral QHS  . pantoprazole  40 mg Oral Q0600  . rosuvastatin  20 mg Oral QHS  . sodium chloride  3 mL Intravenous Q12H   Continuous Infusions:  PRN Meds:.sodium chloride, sodium chloride, sodium chloride, acetaminophen **OR** acetaminophen, feeding supplement (NEPRO CARB STEADY), sodium chloride    PHYSICAL EXAM: Vital signs in last 24 hours: Filed Vitals:   07/08/15 2209 07/09/15 0158 07/09/15 0611 07/09/15 1025  BP: 111/50 111/42 115/46 120/50  Pulse: 85 69 70 69  Temp: 98.2 F (36.8 C) 98.8 F (37.1 C) 98.6 F (37 C) 97.9 F (36.6 C)  TempSrc: Oral Oral Oral Oral  Resp: 20 20 20 20   Height:      Weight:      SpO2: 100% 100% 100% 100%    Weight change:  Filed Weights   07/06/15 0908 07/07/15 1330 07/07/15 1700  Weight: 67.132 kg (148 lb) 64.9 kg (143 lb 1.3 oz) 63.5 kg (139 lb 15.9 oz)   Body mass index is 21.29 kg/(m^2).   Gen Exam: Awake and alert  with clear speech.  Neck: Supple, No JVD.   Chest: B/L Clear.   CVS: S1 S2 Regular, no murmurs.  Abdomen: soft, BS +, non tender, non distended.  Extremities: no edema, lower extremities warm to touch. Neurologic: Mild left-sided deficits, slight left facial droop Skin: No Rash.   Wounds: N/A.   Intake/Output from previous day:  Intake/Output Summary (Last 24 hours) at 07/09/15 1108 Last data filed at 07/09/15 0951  Gross per 24 hour  Intake    623 ml  Output       0 ml  Net    623 ml     LAB RESULTS: CBC  Recent Labs Lab 07/05/15 0540 07/05/15 0549 07/06/15 0526 07/07/15 0537 07/08/15 0636 07/09/15 0401  WBC 6.4  --  6.5 5.8 5.9 5.5  HGB 6.8* 7.1* 7.9* 7.0* 8.0* 7.9*  HCT 20.6* 21.0* 24.4* 22.0* 24.6* 24.9*  PLT 244  --  206 198 125* 125*  MCV 96.7  --  93.5 93.6 92.8 93.6  MCH 31.9  --  30.3 29.8 30.2 29.7  MCHC 33.0  --  32.4 31.8 32.5 31.7  RDW 17.8*  --  18.7* 18.2* 17.5* 17.3*  LYMPHSABS 1.2  --   --   --   --   --   MONOABS 0.5  --   --   --   --   --   EOSABS 0.1  --   --   --   --   --   BASOSABS 0.0  --   --   --   --   --     Chemistries   Recent Labs Lab 07/05/15 0540 07/05/15 0549 07/06/15 0426 07/07/15 0537 07/08/15 0636 07/09/15 0401  NA 140 139 136 139 138 136  K 4.1 4.0 4.0 4.8 4.3 4.1  CL 99* 102 99* 101 102 100*  CO2 28  --  26 27 28 26   GLUCOSE 109* 102* 92 93 95 96  BUN 45* 48* 52* 58* 35* 36*  CREATININE 9.02* 8.60* 9.74* 10.58* 7.85* 9.30*  CALCIUM 8.9  --  8.7* 8.6* 8.7* 8.6*    CBG:  Recent Labs Lab 07/06/15 1136 07/06/15 1137 07/06/15 1150  GLUCAP 29* 37* 84    GFR Estimated Creatinine Clearance: 5.3 mL/min (by C-G formula based on Cr of 9.3).  Coagulation profile  Recent Labs Lab 07/05/15 0540  INR 1.23    Cardiac Enzymes No results for input(s): CKMB, TROPONINI, MYOGLOBIN in the last 168 hours.  Invalid input(s): CK  Invalid input(s): POCBNP No results for input(s): DDIMER in the last 72 hours. No results for input(s): HGBA1C in the last 72 hours. No results for input(s): CHOL, HDL, LDLCALC, TRIG, CHOLHDL, LDLDIRECT in the last 72 hours. No results for input(s): TSH, T4TOTAL, T3FREE, THYROIDAB in the last 72 hours.  Invalid input(s): FREET3 No results for input(s): VITAMINB12, FOLATE, FERRITIN, TIBC, IRON, RETICCTPCT in the last 72 hours. No results for input(s): LIPASE, AMYLASE in the last 72 hours.  Urine Studies No results for input(s): UHGB, CRYS in the  last 72 hours.  Invalid input(s): UACOL, UAPR, USPG, UPH, UTP, UGL, UKET, UBIL, UNIT, UROB, ULEU, UEPI, UWBC, URBC, UBAC, CAST, UCOM, BILUA  MICROBIOLOGY: Recent Results (from the past 240 hour(s))  Surgical pcr screen     Status: None   Collection Time: 07/05/15 11:58 PM  Result Value Ref Range Status   MRSA, PCR NEGATIVE NEGATIVE Final   Staphylococcus aureus NEGATIVE NEGATIVE Final  Comment:        The Xpert SA Assay (FDA approved for NASAL specimens in patients over 36 years of age), is one component of a comprehensive surveillance program.  Test performance has been validated by [facility] for patients greater than or equal to 53 year old. It is not intended to diagnose infection nor to guide or monitor treatment.     RADIOLOGY STUDIES/RESULTS: Mr Herby Abraham Contrast  07/05/2015   CLINICAL DATA:  LEFT facial droop, LEFT upper extremity swelling, and speech difficulty which reportedly began after church on 07/04/2015. There is a history of end-stage renal disease with dialysis.  EXAM: MRI HEAD WITHOUT CONTRAST  TECHNIQUE: Multiplanar, multiecho pulse sequences of the brain and surrounding structures were obtained without intravenous contrast.  COMPARISON:  06/15/2015 MRI.  FINDINGS: Moderate-sized approximate 1 x 1 x 2 cm area of acute infarction affects the subcortical and periventricular white matter of the RIGHT centrum semiovale; this is just superior and posterior to the RIGHT lentiform nucleus. Within limits for detection on MR, no acute hemorrhage.  Advanced atrophy no hydrocephalus or mass lesion. No extra-axial collections. Extensive chronic microvascular ischemic change. Flow voids are maintained in the carotid, basilar, and both vertebral arteries, as well as the RIGHT and LEFT proximal middle cerebral arteries. Partial empty sella. Advanced cervical spondylosis. Retrolisthesis at C3-C4 is redemonstrated. No worrisome osseous findings. No acute sinus or mastoid fluid.  Negative orbits.  Compared with prior MR, this infarct was not present and is acute.  IMPRESSION: 1 x 1 x 2 cm area of nonhemorrhagic acute infarction affecting the subcortical and periventricular deep white matter of the RIGHT centrum semiovale. See discussion above.  Chronic changes as described.   Electronically Signed   By: Staci Righter M.D.   On: 07/05/2015 07:31   Mr Brain Wo Contrast  06/15/2015   CLINICAL DATA:  Syncopal episode while straining for a bowel movement. Rectal bleeding.  EXAM: MRI HEAD WITHOUT CONTRAST  TECHNIQUE: Multiplanar, multiecho pulse sequences of the brain and surrounding structures were obtained without intravenous contrast.  COMPARISON:  MRI brain 06/26/2011.  FINDINGS: The diffusion-weighted images demonstrate no evidence for acute or subacute infarction. Moderate periventricular and subcortical T2 changes are similar to the prior study. Moderate generalized atrophy is again noted. The ventricles are proportionate to the degree of atrophy.  No significant extra-axial fluid collection is present. Flow is present in the major intracranial arteries. The globes and orbits are intact. Mild mucosal thickening is present in the right frontal sinus and anterior ethmoid air cells. The mastoid air cells are clear. The paranasal sinuses are otherwise clear.  The skullbase is within normal limits. Midline intracranial contents are normal.  Moderate spondylosis is present in the upper cervical spine grade 1 retrolisthesis is present at C3-4 with progressive endplate degenerative change. There is chronic loss of disc height at C4-5 and C5-6.  IMPRESSION: 1. No acute intracranial abnormality. 2. Moderate atrophy and white matter disease is similar to the prior study. This likely reflects the sequela of chronic microvascular ischemia. 3. Moderate spondylosis in the upper cervical spine as described.   Electronically Signed   By: San Morelle M.D.   On: 06/15/2015 09:55   Nm Gi Blood  Loss  06/16/2015   CLINICAL DATA:  Gastrointestinal bleeding earlier today  EXAM: NUCLEAR MEDICINE GASTROINTESTINAL BLEEDING SCAN  TECHNIQUE: Sequential abdominal and pelvic images were obtained following intravenous administration of Tc-77m labeled red blood cells for a 2 hr time span.  RADIOPHARMACEUTICALS:  24.4 mCi Tc-4m in-vitro labeled red cells.  COMPARISON:  None.  FINDINGS: There is gastric uptake of radiotracer which is not progressed during this study. There is no abnormal radiotracer uptake in the small or large bowel regions.  IMPRESSION: Increased radiotracer uptake in the stomach raises concern for gastritis. No small bowel or large bowel acute gastrointestinal bleeding focus is identified on this study.   Electronically Signed   By: Lowella Grip III M.D.   On: 06/16/2015 21:09   Nm Bone Scan Whole Body  06/24/2015   CLINICAL DATA:  80 year old with prostate cancer. Patient complains of left-sided pain.  EXAM: NUCLEAR MEDICINE WHOLE BODY BONE SCAN  TECHNIQUE: Whole body anterior and posterior images were obtained approximately 3 hours after intravenous injection of radiopharmaceutical.  RADIOPHARMACEUTICALS:  25 mCi Technetium-1m MDP IV  COMPARISON:  01/02/2005 and 05/06/2015  FINDINGS: There is subtle increased uptake along the right side of the lower lumbar spine near the lumbosacral junction. This is likely degenerative in etiology based on the degenerative changes and scoliosis in the lumbar spine. Changes compatible with a left hip arthroplasty. Slightly increased uptake around the left hip prosthesis is probably related to stress reaction. Mildly increased uptake in both knees are compatible degenerative changes. There is increased uptake along the medial aspect of the right fourth rib. Expected uptake in the renal collecting system. Increased uptake near the cervical-thoracic junction is likely degenerative in etiology. There is increased uptake along the right costovertebral margin  at T9.  IMPRESSION: Subtle increased uptake along the medial right fourth rib. This could be degenerative in etiology but a subtle lesion or fracture cannot be excluded. Consider further characterization with chest and rib radiographs.  Scoliosis and degenerative changes in the lumbar spine.  No clear evidence for metastatic bone disease.  Left hip arthroplasty.   Electronically Signed   By: Markus Daft M.D.   On: 06/24/2015 15:58    Oren Binet, MD  [facility] Pager:[phone]  If 7PM-7AM, please contact night-coverage [email or web address] Password [email or web address] 07/09/2015, 11:08 AM   LOS: 4 days

## 2015-07-09 NOTE — Progress Notes (Signed)
Patient is supposed to have blood transfusion in dialysis today per attending.

## 2015-07-09 NOTE — Progress Notes (Signed)
Got a call from pt's RN that pt's wife is upset due to the delay in his husband dialysis time and wanted to talk with the Supervisor,pt was supposed to have his dialysis in the morning. Spoke with pt and the wife and also called the [facility] if he could find out the reason for the delay, pt's RN Messan had earlier called dialysis dept and spoke with the dialysis nurse who said they will get him sometimes around midnight. I also spoke the dialysis RN who said they try to get to every pt as need arises, same explained to pt and his wife, but she said she was going to take it up anyway,she was however apologized to, will however continue to monitor. Obasogie-Asidi, Jt Brabec Efe

## 2015-07-09 NOTE — Procedures (Signed)
I have seen and examined this patient and agree with the plan of care . Patient doing well with dialysis and no complaints . Hb 7.9  K 4 1    Blood loss anemia being evaluated   [name] W 07/09/2015, 6:17 PM

## 2015-07-09 NOTE — Progress Notes (Signed)
Daily Rounding Note  07/09/2015, 8:24 AM  LOS: 4 days   SUBJECTIVE:       No BM today.  Last recorded BM was small, bloody at ~8AM yesterday. Feels well.  OBJECTIVE:         Vital signs in last 24 hours:    Temp:  [98 F (36.7 C)-98.8 F (37.1 C)] 98.6 F (37 C) (09/02 [id number]) Pulse Rate:  [63-85] 70 (09/02 0611) Resp:  [20] 20 (09/02 0611) BP: (108-119)/(40-50) 115/46 mmHg (09/02 0611) SpO2:  [97 %-100 %] 100 % (09/02 0611) Last BM Date: 07/06/15 Filed Weights   07/06/15 0908 07/07/15 1330 07/07/15 1700  Weight: 148 lb (67.132 kg) 143 lb 1.3 oz (64.9 kg) 139 lb 15.9 oz (63.5 kg)   General: looks well but frail   Heart: RRR Chest: clear bil.  No dyspnea Abdomen: soft, NT, ND.  No mass or HSM  Extremities: no CCE Neuro/Psych:  Moves all 4s, face symmetric, some speech delay.  Oriented x 3.  Moves all 4 limbs, strength not tested.   Intake/Output from previous day: 09/01 0701 - 09/02 0700 In: 523 [P.O.:520; I.V.:3] Out: -   Intake/Output this shift:    Lab Results:  Recent Labs  07/07/15 0537 07/08/15 0636 07/09/15 0401  WBC 5.8 5.9 5.5  HGB 7.0* 8.0* 7.9*  HCT 22.0* 24.6* 24.9*  PLT 198 125* 125*   BMET  Recent Labs  07/07/15 0537 07/08/15 0636 07/09/15 0401  NA 139 138 136  K 4.8 4.3 4.1  CL 101 102 100*  CO2 27 28 26   GLUCOSE 93 95 96  BUN 58* 35* 36*  CREATININE 10.58* 7.85* 9.30*  CALCIUM 8.6* 8.7* 8.6*   LFT  Recent Labs  07/07/15 0537 07/08/15 0636 07/09/15 0401  ALBUMIN 3.1* 3.1* 3.0*    Studies/Results: No results found.  Scheduled Meds: . sodium chloride   Intravenous Once  . allopurinol  100 mg Oral Daily  . calcitRIOL  1.5 mcg Oral Q M,W,F-HD  . citalopram  10 mg Oral Daily  . darbepoetin (ARANESP) injection - DIALYSIS  200 mcg Intravenous Q Wed-HD  . diphenhydrAMINE  25 mg Intravenous Once  . docusate sodium  200 mg Oral BID  . doxercalciferol  1.5 mcg Oral Once  per day on Mon Wed Fri  . multivitamin  1 tablet Oral QHS  . pantoprazole  40 mg Oral Q0600  . rosuvastatin  20 mg Oral QHS  . sodium chloride  3 mL Intravenous Q12H   Continuous Infusions:  PRN Meds:.sodium chloride, sodium chloride, sodium chloride, acetaminophen **OR** acetaminophen, feeding supplement (NEPRO CARB STEADY), sodium chloride   ASSESMENT:   * FOBT +, intermittent dark stools as well as hematochezia.  Suspect stuttering diverticular bleed.  Hematochezia/anemia work up 3 weeks ago (EGD/flex sig/colonoscopy/Nuc RBC scan) with diagnosis of diverticular bleed. Non-erosive gastritis on EGD, not on PPI or H2 blocker at discharge but Protonix started yesterday.  Capsule endo with blood distal to cecum, no SB or cecal abnormalities.   * ABL and chronic disease anemia. On Mircera/ Aranesp. S/p PRBCs x 2. Hgb improved and stable.   *  Non-critical thrombocytopenia.   * Acute CVA with left sided weakness, facial droop and slowed speech: Mild sxs, note speech language deficits as outlined by SLP 07/08/15. No antithrombotics due to GIB. ASA 81 mg not yet started.   * ESRD. HD on MWF.      PLAN   *  Agree with Dr Nena Alexander plan to transfuse additional PRBC at HD today.  ? Home after dialysis today?    Azucena Freed  07/09/2015, 8:24 AM Pager: [phone]     Attending physician's note   I have taken an interval history, reviewed the chart and examined the patient. I agree with the Advanced Practitioner's note, impression and recommendations. Resolving diverticular bleed. Hb stable but low. Agree with additional PRBC transfusion today. OK for discharge from GI standpoint later today or tomorrow if no recurrent bleeding. Outpatient GI follow up with Dr. [name] in 2-4 weeks. GI signing off.   Pricilla Riffle. Fuller Plan, MD [facility] [phone] pager Mon-Fri 8a-5p [phone] weekends, holidays and 5p-8a or per [facility]

## 2015-07-10 LAB — CBC
HEMATOCRIT: 31.4 % — AB (ref 39.0–52.0)
HEMOGLOBIN: 10.2 g/dL — AB (ref 13.0–17.0)
MCH: 29.2 pg (ref 26.0–34.0)
MCHC: 32.5 g/dL (ref 30.0–36.0)
MCV: 90 fL (ref 78.0–100.0)
Platelets: 137 10*3/uL — ABNORMAL LOW (ref 150–400)
RBC: 3.49 MIL/uL — ABNORMAL LOW (ref 4.22–5.81)
RDW: 19.8 % — ABNORMAL HIGH (ref 11.5–15.5)
WBC: 8.3 10*3/uL (ref 4.0–10.5)

## 2015-07-10 LAB — RENAL FUNCTION PANEL
ALBUMIN: 3.6 g/dL (ref 3.5–5.0)
ANION GAP: 12 (ref 5–15)
BUN: 13 mg/dL (ref 6–20)
CHLORIDE: 95 mmol/L — AB (ref 101–111)
CO2: 27 mmol/L (ref 22–32)
Calcium: 8.7 mg/dL — ABNORMAL LOW (ref 8.9–10.3)
Creatinine, Ser: 5.09 mg/dL — ABNORMAL HIGH (ref 0.61–1.24)
GFR calc Af Amer: 11 mL/min — ABNORMAL LOW (ref >60)
GFR calc non Af Amer: 9 mL/min — ABNORMAL LOW (ref >60)
GLUCOSE: 89 mg/dL (ref 65–99)
PHOSPHORUS: 3.4 mg/dL (ref 2.5–4.6)
POTASSIUM: 3.5 mmol/L (ref 3.5–5.1)
Sodium: 134 mmol/L — ABNORMAL LOW (ref 135–145)

## 2015-07-10 MED ORDER — ASPIRIN 81 MG PO TABS: 81.0000 mg | ORAL_TABLET | Freq: Every day | ORAL | Status: DC

## 2015-07-10 MED ORDER — PANTOPRAZOLE SODIUM 40 MG PO TBEC
40.0000 mg | DELAYED_RELEASE_TABLET | Freq: Every day | ORAL | Status: DC
Start: 2015-07-10 — End: 2015-09-09

## 2015-07-10 MED ORDER — PENTAFLUOROPROP-TETRAFLUOROETH EX AERO
INHALATION_SPRAY | CUTANEOUS | Status: AC
  Filled 2015-07-10: qty 103.5

## 2015-07-10 MED ORDER — NEPRO/CARBSTEADY PO LIQD: 237.0000 mL | ORAL | Status: DC | PRN

## 2015-07-10 NOTE — Progress Notes (Signed)
Patient is back from dialysis, alert and oriented, and in no acute distress. Will continue to monitor.

## 2015-07-10 NOTE — Progress Notes (Signed)
Pt was discharged home with his family member. Pt verbalized understanding discharge instructions, follow-up appts, and discharge medications. Pt denied any complaints or concerns at the time of discharge. Pt verbalized understanding when to notify the doctor while at home.

## 2015-07-10 NOTE — Discharge Summary (Signed)
PATIENT DETAILS Name: [name] Age: 81 y.o. Sex: male Date of Birth: 05-May-1929 MRN: [id number]. Admitting Physician: No admitting provider for patient encounter. [id number] Jenny Reichmann, MD  Admit Date: 07/05/2015 Discharge date: 07/10/2015  Recommendations for Outpatient Follow-up:  1. If has another episode of hematochezia, may need to permanently discontinue ASA. 2. Please repeat CBC/BMET at next visit 3. Atenolol on hold-resume when able   PRIMARY DISCHARGE DIAGNOSIS:  Principal Problem:   Stroke Active Problems:   Gout   Essential hypertension   End stage renal disease   Depression   Acute blood loss anemia   Diverticulosis of colon with hemorrhage   Anemia, blood loss   History of GI bleed   HLD (hyperlipidemia)   Bleeding gastrointestinal      PAST MEDICAL HISTORY: Past Medical History  Diagnosis Date  . ANEMIA-IRON DEFICIENCY 03/09/2008  . DIVERTICULOSIS, COLON 03/09/2008  . GOUT 03/09/2008  . HYPERLIPIDEMIA 03/09/2008  . HYPERTENSION 03/09/2008  . PROSTATE CANCER, HX OF 03/09/2008  . ESRD (end stage renal disease) 03/09/2008  . Complex renal cyst 06/19/2011  . Hyperparathyroidism   . Prostate cancer 2002    Completed external beam radiation 2003.per HPI  . Arthritis     cervical spine.   Marland Kitchen History of blood transfusion   . Depression 03/26/2014  . Hemorrhoids 08/2009    internal.   . Radiation cystitis 2010.    DISCHARGE MEDICATIONS: Current Discharge Medication List    START taking these medications   Details  Nutritional Supplements (FEEDING SUPPLEMENT, NEPRO CARB STEADY,) LIQD Take 237 mLs by mouth as needed (missed meal during dialysis.). Qty: 30 Can, Refills: 0    pantoprazole (PROTONIX) 40 MG tablet Take 1 tablet (40 mg total) by mouth daily at 6 (six) AM. Qty: 30 tablet, Refills: 0      CONTINUE these medications which have CHANGED   Details  aspirin 81 MG tablet Take 1 tablet (81 mg total) by mouth daily.      CONTINUE these medications which  have NOT CHANGED   Details  allopurinol (ZYLOPRIM) 100 MG tablet Take 1 tablet (100 mg total) by mouth daily. Qty: 90 tablet, Refills: 3    citalopram (CELEXA) 10 MG tablet Take 1 tablet (10 mg total) by mouth daily. Qty: 90 tablet, Refills: 3    docusate sodium (COLACE) 100 MG capsule Take 2 capsules (200 mg total) by mouth 2 (two) times daily. Qty: 60 capsule, Refills: 0    doxercalciferol (HECTOROL) 0.5 MCG capsule Take 1.5 mcg by mouth 3 (three) times a week. MWF    rosuvastatin (CRESTOR) 20 MG tablet Take 1 tablet (20 mg total) by mouth at bedtime. Qty: 90 tablet, Refills: 3    Specialty Vitamins Products (ONE-A-DAY ENERGY FORMULA PO) Take 1 capsule by mouth daily.    tiZANidine (ZANAFLEX) 4 MG tablet Take 1 tablet (4 mg total) by mouth every 6 (six) hours as needed for muscle spasms. Qty: 30 tablet, Refills: 0      STOP taking these medications     atenolol (TENORMIN) 50 MG tablet         ALLERGIES:  No Known Allergies  BRIEF HPI:  See H&P, Labs, Consult and Test reports for all details in brief, patient a 81 y.o. male with past medical history significant for hypertension, hyperlipidemia, diverticulosis of the colon, end-stage renal disease on hemodialysis, depression and recent admission around August 16 due to GI bleed; who presented to the hospital secondary to left facial droop and  left-sided weakness.In the ED and MRI demonstrated acute right brain nonhemorrhagic stroke and patient's hemoglobin was found to be 6.8 (last hemoglobin at discharge was 8.7).   CONSULTATIONS:   GI, nephrology and neurology  PERTINENT RADIOLOGIC STUDIES: Mr Brain Wo Contrast  07/05/2015   CLINICAL DATA:  LEFT facial droop, LEFT upper extremity swelling, and speech difficulty which reportedly began after church on 07/04/2015. There is a history of end-stage renal disease with dialysis.  EXAM: MRI HEAD WITHOUT CONTRAST  TECHNIQUE: Multiplanar, multiecho pulse sequences of the brain and  surrounding structures were obtained without intravenous contrast.  COMPARISON:  06/15/2015 MRI.  FINDINGS: Moderate-sized approximate 1 x 1 x 2 cm area of acute infarction affects the subcortical and periventricular white matter of the RIGHT centrum semiovale; this is just superior and posterior to the RIGHT lentiform nucleus. Within limits for detection on MR, no acute hemorrhage.  Advanced atrophy no hydrocephalus or mass lesion. No extra-axial collections. Extensive chronic microvascular ischemic change. Flow voids are maintained in the carotid, basilar, and both vertebral arteries, as well as the RIGHT and LEFT proximal middle cerebral arteries. Partial empty sella. Advanced cervical spondylosis. Retrolisthesis at C3-C4 is redemonstrated. No worrisome osseous findings. No acute sinus or mastoid fluid. Negative orbits.  Compared with prior MR, this infarct was not present and is acute.  IMPRESSION: 1 x 1 x 2 cm area of nonhemorrhagic acute infarction affecting the subcortical and periventricular deep white matter of the RIGHT centrum semiovale. See discussion above.  Chronic changes as described.   Electronically Signed   By: Staci Righter M.D.   On: 07/05/2015 07:31   Mr Brain Wo Contrast  06/15/2015   CLINICAL DATA:  Syncopal episode while straining for a bowel movement. Rectal bleeding.  EXAM: MRI HEAD WITHOUT CONTRAST  TECHNIQUE: Multiplanar, multiecho pulse sequences of the brain and surrounding structures were obtained without intravenous contrast.  COMPARISON:  MRI brain 06/26/2011.  FINDINGS: The diffusion-weighted images demonstrate no evidence for acute or subacute infarction. Moderate periventricular and subcortical T2 changes are similar to the prior study. Moderate generalized atrophy is again noted. The ventricles are proportionate to the degree of atrophy.  No significant extra-axial fluid collection is present. Flow is present in the major intracranial arteries. The globes and orbits are  intact. Mild mucosal thickening is present in the right frontal sinus and anterior ethmoid air cells. The mastoid air cells are clear. The paranasal sinuses are otherwise clear.  The skullbase is within normal limits. Midline intracranial contents are normal.  Moderate spondylosis is present in the upper cervical spine grade 1 retrolisthesis is present at C3-4 with progressive endplate degenerative change. There is chronic loss of disc height at C4-5 and C5-6.  IMPRESSION: 1. No acute intracranial abnormality. 2. Moderate atrophy and white matter disease is similar to the prior study. This likely reflects the sequela of chronic microvascular ischemia. 3. Moderate spondylosis in the upper cervical spine as described.   Electronically Signed   By: San Morelle M.D.   On: 06/15/2015 09:55   Nm Gi Blood Loss  06/16/2015   CLINICAL DATA:  Gastrointestinal bleeding earlier today  EXAM: NUCLEAR MEDICINE GASTROINTESTINAL BLEEDING SCAN  TECHNIQUE: Sequential abdominal and pelvic images were obtained following intravenous administration of Tc-16m labeled red blood cells for a 2 hr time span.  RADIOPHARMACEUTICALS:  24.4 mCi Tc-76m in-vitro labeled red cells.  COMPARISON:  None.  FINDINGS: There is gastric uptake of radiotracer which is not progressed during this study. There is no abnormal  radiotracer uptake in the small or large bowel regions.  IMPRESSION: Increased radiotracer uptake in the stomach raises concern for gastritis. No small bowel or large bowel acute gastrointestinal bleeding focus is identified on this study.   Electronically Signed   By: Lowella Grip III M.D.   On: 06/16/2015 21:09   Nm Bone Scan Whole Body  06/24/2015   CLINICAL DATA:  81 year old with prostate cancer. Patient complains of left-sided pain.  EXAM: NUCLEAR MEDICINE WHOLE BODY BONE SCAN  TECHNIQUE: Whole body anterior and posterior images were obtained approximately 3 hours after intravenous injection of radiopharmaceutical.   RADIOPHARMACEUTICALS:  25 mCi Technetium-14m MDP IV  COMPARISON:  01/02/2005 and 05/06/2015  FINDINGS: There is subtle increased uptake along the right side of the lower lumbar spine near the lumbosacral junction. This is likely degenerative in etiology based on the degenerative changes and scoliosis in the lumbar spine. Changes compatible with a left hip arthroplasty. Slightly increased uptake around the left hip prosthesis is probably related to stress reaction. Mildly increased uptake in both knees are compatible degenerative changes. There is increased uptake along the medial aspect of the right fourth rib. Expected uptake in the renal collecting system. Increased uptake near the cervical-thoracic junction is likely degenerative in etiology. There is increased uptake along the right costovertebral margin at T9.  IMPRESSION: Subtle increased uptake along the medial right fourth rib. This could be degenerative in etiology but a subtle lesion or fracture cannot be excluded. Consider further characterization with chest and rib radiographs.  Scoliosis and degenerative changes in the lumbar spine.  No clear evidence for metastatic bone disease.  Left hip arthroplasty.   Electronically Signed   By: Markus Daft M.D.   On: 06/24/2015 15:58     PERTINENT LAB RESULTS: CBC:  Recent Labs  07/09/15 0401 07/10/15 0900  WBC 5.5 8.3  HGB 7.9* 10.2*  HCT 24.9* 31.4*  PLT 125* 137*   CMET CMP     Component Value Date/Time   NA 136 07/09/2015 0401   K 4.1 07/09/2015 0401   CL 100* 07/09/2015 0401   CO2 26 07/09/2015 0401   GLUCOSE 96 07/09/2015 0401   BUN 36* 07/09/2015 0401   CREATININE 9.30* 07/09/2015 0401   CALCIUM 8.6* 07/09/2015 0401   PROT 5.9* 07/05/2015 0540   ALBUMIN 3.0* 07/09/2015 0401   AST 22 07/05/2015 0540   ALT 12* 07/05/2015 0540   ALKPHOS 51 07/05/2015 0540   BILITOT 0.3 07/05/2015 0540   GFRNONAA 5* 07/09/2015 0401   GFRAA 5* 07/09/2015 0401    GFR Estimated Creatinine  Clearance: 5.1 mL/min (by C-G formula based on Cr of 9.3). No results for input(s): LIPASE, AMYLASE in the last 72 hours. No results for input(s): CKTOTAL, CKMB, CKMBINDEX, TROPONINI in the last 72 hours. Invalid input(s): POCBNP No results for input(s): DDIMER in the last 72 hours. No results for input(s): HGBA1C in the last 72 hours. No results for input(s): CHOL, HDL, LDLCALC, TRIG, CHOLHDL, LDLDIRECT in the last 72 hours. No results for input(s): TSH, T4TOTAL, T3FREE, THYROIDAB in the last 72 hours.  Invalid input(s): FREET3 No results for input(s): VITAMINB12, FOLATE, FERRITIN, TIBC, IRON, RETICCTPCT in the last 72 hours. Coags: No results for input(s): INR in the last 72 hours.  Invalid input(s): PT Microbiology: Recent Results (from the past 240 hour(s))  Surgical pcr screen     Status: None   Collection Time: 07/05/15 11:58 PM  Result Value Ref Range Status   MRSA, PCR NEGATIVE  NEGATIVE Final   Staphylococcus aureus NEGATIVE NEGATIVE Final    Comment:        The Xpert SA Assay (FDA approved for NASAL specimens in patients over 70 years of age), is one component of a comprehensive surveillance program.  Test performance has been validated by [facility] for patients greater than or equal to 84 year old. It is not intended to diagnose infection nor to guide or monitor treatment.      BRIEF HOSPITAL COURSE:  Acute CVA: Mild left-sided deficits, Echo negative for embolic source-EF preserved, carotid Doppler negative for significant stenosis. A1c 4.9, LDL 98 (goal less than 70). Resume aspirin tomorrow   Active Problems: GI bleed: Recently admitted for GI bleed, EGD/colonoscopy on 8/12 was negative, readmitted with melanotic stools and acute blood loss anemia. Capsule Endoscopy negative as well. GI suspects a stuttering diverticular bleed. No further bleeding for the past 48 hours, hemoglobin is stable at 10.2 . No further recommendations from GI-ok to discharge. Per GI  ok to resume a ASA-especially in light of acute CVA. However if has recurrence of hematochezia then may need to stop permanently  Acute blood loss anemia: Received 3units of PRBC so far, hemoglobin is stable at 10.2. Much improved and stable for discharge.   Dyslipidemia: LDL 98 (goal less than 70)-continue Crestor  Essential hypertension: Blood pressure currently controlled-will continue to hold Atenolol on discharge.Please reassess at next visit with PCP  Gout: Will continue allopurinol  Depression:Will continue Celexa  ESRD:HD MWF-renal followed during this entire hospital stay-resume outpatient HD schedule on discharge.  L upper extrem swelling: Ultrasound Doppler negative DVT   TODAY-DAY OF DISCHARGE:  Subjective:   Jeneen Rinks Mato today has no headache,no chest abdominal pain,no new weakness tingling or numbness, feels much better wants to go home today. No further melena or hematochezia  Objective:   Blood pressure 113/46, pulse 70, temperature 98.1 F (36.7 C), temperature source Oral, resp. rate 17, height 5\' 8"  (1.727 m), weight 60.5 kg (133 lb 6.1 oz), SpO2 98 %.  Intake/Output Summary (Last 24 hours) at 07/10/15 0958 Last data filed at 07/10/15 0900  Gross per 24 hour  Intake    682 ml  Output   2000 ml  Net  -1318 ml   Filed Weights   07/07/15 1330 07/07/15 1700 07/09/15 2330  Weight: 64.9 kg (143 lb 1.3 oz) 63.5 kg (139 lb 15.9 oz) 60.5 kg (133 lb 6.1 oz)    Exam Awake Alert, Oriented *3, No new F.N deficits, Normal affect [place].AT,PERRAL Supple Neck,No JVD, No cervical lymphadenopathy appriciated.  Symmetrical Chest wall movement, Good air movement bilaterally, CTAB RRR,No Gallops,Rubs or new Murmurs, No Parasternal Heave +ve B.Sounds, Abd Soft, Non tender, No organomegaly appriciated, No rebound -guarding or rigidity. No Cyanosis, Clubbing or edema, No new Rash or bruise  DISCHARGE CONDITION: Stable  DISPOSITION: Home with home health  services  DISCHARGE INSTRUCTIONS:    Activity:  As tolerated with Full fall precautions use walker/cane & assistance as needed  Get Medicines reviewed and adjusted: Please take all your medications with you for your next visit with your Primary MD  Please request your Primary MD to go over all hospital tests and procedure/radiological results at the follow up, please ask your Primary MD to get all Hospital records sent to his/her office.  If you experience worsening of your admission symptoms, develop shortness of breath, life threatening emergency, suicidal or homicidal thoughts you must seek medical attention immediately by calling 911 or calling  your MD immediately  if symptoms less severe.  You must read complete instructions/literature along with all the possible adverse reactions/side effects for all the Medicines you take and that have been prescribed to you. Take any new Medicines after you have completely understood and accpet all the possible adverse reactions/side effects.   Do not drive when taking Pain medications.   Do not take more than prescribed Pain, Sleep and Anxiety Medications  Special Instructions: If you have smoked or chewed Tobacco  in the last 2 yrs please stop smoking, stop any regular Alcohol  and or any Recreational drug use.  Wear Seat belts while driving.  Please note  You were cared for by a hospitalist during your hospital stay. Once you are discharged, your primary care physician will handle any further medical issues. Please note that NO REFILLS for any discharge medications will be authorized once you are discharged, as it is imperative that you return to your primary care physician (or establish a relationship with a primary care physician if you do not have one) for your aftercare needs so that they can reassess your need for medications and monitor your lab values.   Diet recommendation: Heart Healthy/Renal diet  Discharge Instructions    Call MD  for:    Complete by:  As directed   Black or bloody stools     Diet - low sodium heart healthy    Complete by:  As directed      Increase activity slowly    Complete by:  As directed            Follow-up Information    Follow up with Xu,Jindong, MD. Schedule an appointment as soon as possible for a visit in 2 months.   Specialty:  Neurology   Why:  stroke clinic   Contact information:   [address] [place] [address] [phone]       Follow up with [name], MD. Schedule an appointment as soon as possible for a visit in 1 week.   Specialties:  Internal Medicine, Radiology   Contact information:   [place] [address] [phone]       Please follow up.   Contact information:   Hemodialysis Unit at your usual schedule     Total Time spent on discharge equals 45 minutes.  SignedOren Binet 07/10/2015 9:58 AM

## 2015-07-10 NOTE — Progress Notes (Signed)
[facility] ROUNDING NOTE   Subjective:   Interval History: no complaints today  Objective:  Vital signs in last 24 hours:  Temp:  [97.9 F (36.6 C)-98.4 F (36.9 C)] 98.1 F (36.7 C) (09/03 0935) Pulse Rate:  [69-79] 70 (09/03 0935) Resp:  [16-20] 17 (09/03 0935) BP: (97-135)/(43-61) 113/46 mmHg (09/03 0935) SpO2:  [98 %-100 %] 98 % (09/03 0935) Weight:  [60.5 kg (133 lb 6.1 oz)] 60.5 kg (133 lb 6.1 oz) (09/02 2330)  Weight change:  Filed Weights   07/07/15 1330 07/07/15 1700 07/09/15 2330  Weight: 64.9 kg (143 lb 1.3 oz) 63.5 kg (139 lb 15.9 oz) 60.5 kg (133 lb 6.1 oz)    Intake/Output: I/O last 3 completed shifts: In: 685 [P.O.:360; I.V.:3; Blood:322] Out: 2000 [Other:2000]   Intake/Output this shift:  Total I/O In: 360 [P.O.:360] Out: -   CVS- RRR RS- CTA ABD- BS present soft non-distended EXT- no edema   Basic Metabolic Panel:  Recent Labs Lab 07/06/15 0426 07/07/15 0537 07/08/15 0636 07/09/15 0401 07/10/15 0900  NA 136 139 138 136 134*  K 4.0 4.8 4.3 4.1 3.5  CL 99* 101 102 100* 95*  CO2 26 27 28 26 27   GLUCOSE 92 93 95 96 89  BUN 52* 58* 35* 36* 13  CREATININE 9.74* 10.58* 7.85* 9.30* 5.09*  CALCIUM 8.7* 8.6* 8.7* 8.6* 8.7*  PHOS 4.3 5.2* 4.6 5.3* 3.4    Liver Function Tests:  Recent Labs Lab 07/05/15 0540 07/06/15 0426 07/07/15 0537 07/08/15 0636 07/09/15 0401 07/10/15 0900  AST 22  --   --   --   --   --   ALT 12*  --   --   --   --   --   ALKPHOS 51  --   --   --   --   --   BILITOT 0.3  --   --   --   --   --   PROT 5.9*  --   --   --   --   --   ALBUMIN 3.5 3.2* 3.1* 3.1* 3.0* 3.6   No results for input(s): LIPASE, AMYLASE in the last 168 hours. No results for input(s): AMMONIA in the last 168 hours.  CBC:  Recent Labs Lab 07/05/15 0540  07/06/15 0526 07/07/15 0537 07/08/15 0636 07/09/15 0401 07/10/15 0900  WBC 6.4  --  6.5 5.8 5.9 5.5 8.3  NEUTROABS 4.4  --   --   --   --   --   --   HGB 6.8*  < >  7.9* 7.0* 8.0* 7.9* 10.2*  HCT 20.6*  < > 24.4* 22.0* 24.6* 24.9* 31.4*  MCV 96.7  --  93.5 93.6 92.8 93.6 90.0  PLT 244  --  206 198 125* 125* 137*  < > = values in this interval not displayed.  Cardiac Enzymes: No results for input(s): CKTOTAL, CKMB, CKMBINDEX, TROPONINI in the last 168 hours.  BNP: Invalid input(s): POCBNP  CBG:  Recent Labs Lab 07/06/15 1136 07/06/15 1137 07/06/15 1150  GLUCAP 29* 30* 71    Microbiology: Results for orders placed or performed during the hospital encounter of 07/05/15  Surgical pcr screen     Status: None   Collection Time: 07/05/15 11:58 PM  Result Value Ref Range Status   MRSA, PCR NEGATIVE NEGATIVE Final   Staphylococcus aureus NEGATIVE NEGATIVE Final    Comment:        The Xpert SA Assay (FDA  approved for NASAL specimens in patients over 97 years of age), is one component of a comprehensive surveillance program.  Test performance has been validated by [facility] for patients greater than or equal to 19 year old. It is not intended to diagnose infection nor to guide or monitor treatment.     Coagulation Studies: No results for input(s): LABPROT, INR in the last 72 hours.  Urinalysis: No results for input(s): COLORURINE, LABSPEC, PHURINE, GLUCOSEU, HGBUR, BILIRUBINUR, KETONESUR, PROTEINUR, UROBILINOGEN, NITRITE, LEUKOCYTESUR in the last 72 hours.  Invalid input(s): APPERANCEUR    Imaging: No results found.   Medications:     . sodium chloride   Intravenous Once  . allopurinol  100 mg Oral Daily  . calcitRIOL  1.5 mcg Oral Q M,W,F-HD  . citalopram  10 mg Oral Daily  . darbepoetin (ARANESP) injection - DIALYSIS  200 mcg Intravenous Q Wed-HD  . diphenhydrAMINE  25 mg Intravenous Once  . docusate sodium  200 mg Oral BID  . doxercalciferol  1.5 mcg Oral Once per day on Mon Wed Fri  . multivitamin  1 tablet Oral QHS  . pantoprazole  40 mg Oral Q0600  . pentafluoroprop-tetrafluoroeth      . rosuvastatin  20 mg  Oral QHS  . sodium chloride  3 mL Intravenous Q12H   sodium chloride, sodium chloride, sodium chloride, sodium chloride, sodium chloride, acetaminophen **OR** acetaminophen, alteplase, feeding supplement (NEPRO CARB STEADY), heparin, lidocaine (PF), lidocaine-prilocaine, pentafluoroprop-tetrafluoroeth, sodium chloride  Assessment/ Plan:   ESRD- Hd MWF  ANEMIA- chronic anemia with unrevealing work up  MBD- stable  HTN/VOL- controlled  ACCESS- AVF no issues     LOS: 5 [place] @TODAY @10 :52 AM

## 2015-07-10 NOTE — Progress Notes (Signed)
Patient just left the unit for dialysis.

## 2015-07-11 DIAGNOSIS — N186 End stage renal disease: Secondary | ICD-10-CM | POA: Diagnosis not present

## 2015-07-11 DIAGNOSIS — K2970 Gastritis, unspecified, without bleeding: Secondary | ICD-10-CM | POA: Diagnosis not present

## 2015-07-11 DIAGNOSIS — D62 Acute posthemorrhagic anemia: Secondary | ICD-10-CM | POA: Diagnosis not present

## 2015-07-11 DIAGNOSIS — K5731 Diverticulosis of large intestine without perforation or abscess with bleeding: Secondary | ICD-10-CM | POA: Diagnosis not present

## 2015-07-11 DIAGNOSIS — I120 Hypertensive chronic kidney disease with stage 5 chronic kidney disease or end stage renal disease: Secondary | ICD-10-CM | POA: Diagnosis not present

## 2015-07-11 DIAGNOSIS — D124 Benign neoplasm of descending colon: Secondary | ICD-10-CM | POA: Diagnosis not present

## 2015-07-11 LAB — TYPE AND SCREEN
ABO/RH(D): O POS
ANTIBODY SCREEN: NEGATIVE
Unit division: 0

## 2015-07-12 DIAGNOSIS — N186 End stage renal disease: Secondary | ICD-10-CM | POA: Diagnosis not present

## 2015-07-12 DIAGNOSIS — D631 Anemia in chronic kidney disease: Secondary | ICD-10-CM | POA: Diagnosis not present

## 2015-07-12 DIAGNOSIS — K2970 Gastritis, unspecified, without bleeding: Secondary | ICD-10-CM | POA: Diagnosis not present

## 2015-07-12 DIAGNOSIS — D62 Acute posthemorrhagic anemia: Secondary | ICD-10-CM | POA: Diagnosis not present

## 2015-07-12 DIAGNOSIS — D124 Benign neoplasm of descending colon: Secondary | ICD-10-CM | POA: Diagnosis not present

## 2015-07-12 DIAGNOSIS — N2581 Secondary hyperparathyroidism of renal origin: Secondary | ICD-10-CM | POA: Diagnosis not present

## 2015-07-12 DIAGNOSIS — K5731 Diverticulosis of large intestine without perforation or abscess with bleeding: Secondary | ICD-10-CM | POA: Diagnosis not present

## 2015-07-12 DIAGNOSIS — I120 Hypertensive chronic kidney disease with stage 5 chronic kidney disease or end stage renal disease: Secondary | ICD-10-CM | POA: Diagnosis not present

## 2015-07-13 DIAGNOSIS — I120 Hypertensive chronic kidney disease with stage 5 chronic kidney disease or end stage renal disease: Secondary | ICD-10-CM | POA: Diagnosis not present

## 2015-07-13 DIAGNOSIS — K2970 Gastritis, unspecified, without bleeding: Secondary | ICD-10-CM | POA: Diagnosis not present

## 2015-07-13 DIAGNOSIS — D62 Acute posthemorrhagic anemia: Secondary | ICD-10-CM | POA: Diagnosis not present

## 2015-07-13 DIAGNOSIS — K5731 Diverticulosis of large intestine without perforation or abscess with bleeding: Secondary | ICD-10-CM | POA: Diagnosis not present

## 2015-07-13 DIAGNOSIS — D124 Benign neoplasm of descending colon: Secondary | ICD-10-CM | POA: Diagnosis not present

## 2015-07-13 DIAGNOSIS — N186 End stage renal disease: Secondary | ICD-10-CM | POA: Diagnosis not present

## 2015-07-14 ENCOUNTER — Encounter: Payer: Self-pay | Admitting: Gastroenterology

## 2015-07-14 DIAGNOSIS — D631 Anemia in chronic kidney disease: Secondary | ICD-10-CM | POA: Diagnosis not present

## 2015-07-14 DIAGNOSIS — N186 End stage renal disease: Secondary | ICD-10-CM | POA: Diagnosis not present

## 2015-07-14 DIAGNOSIS — N2581 Secondary hyperparathyroidism of renal origin: Secondary | ICD-10-CM | POA: Diagnosis not present

## 2015-07-15 ENCOUNTER — Other Ambulatory Visit (INDEPENDENT_AMBULATORY_CARE_PROVIDER_SITE_OTHER): Payer: Medicare Other

## 2015-07-15 ENCOUNTER — Ambulatory Visit (INDEPENDENT_AMBULATORY_CARE_PROVIDER_SITE_OTHER): Payer: Medicare Other | Admitting: Internal Medicine

## 2015-07-15 ENCOUNTER — Telehealth: Payer: Self-pay

## 2015-07-15 ENCOUNTER — Encounter: Payer: Self-pay | Admitting: Internal Medicine

## 2015-07-15 VITALS — BP 148/52 | HR 77 | Temp 97.8°F | Ht 68.0 in | Wt 148.0 lb

## 2015-07-15 DIAGNOSIS — K5731 Diverticulosis of large intestine without perforation or abscess with bleeding: Secondary | ICD-10-CM | POA: Diagnosis not present

## 2015-07-15 DIAGNOSIS — I639 Cerebral infarction, unspecified: Secondary | ICD-10-CM | POA: Diagnosis not present

## 2015-07-15 DIAGNOSIS — I10 Essential (primary) hypertension: Secondary | ICD-10-CM | POA: Diagnosis not present

## 2015-07-15 DIAGNOSIS — E785 Hyperlipidemia, unspecified: Secondary | ICD-10-CM

## 2015-07-15 DIAGNOSIS — I120 Hypertensive chronic kidney disease with stage 5 chronic kidney disease or end stage renal disease: Secondary | ICD-10-CM | POA: Diagnosis not present

## 2015-07-15 DIAGNOSIS — K2970 Gastritis, unspecified, without bleeding: Secondary | ICD-10-CM | POA: Diagnosis not present

## 2015-07-15 DIAGNOSIS — Z8719 Personal history of other diseases of the digestive system: Secondary | ICD-10-CM

## 2015-07-15 DIAGNOSIS — N186 End stage renal disease: Secondary | ICD-10-CM | POA: Diagnosis not present

## 2015-07-15 DIAGNOSIS — D124 Benign neoplasm of descending colon: Secondary | ICD-10-CM | POA: Diagnosis not present

## 2015-07-15 DIAGNOSIS — D62 Acute posthemorrhagic anemia: Secondary | ICD-10-CM | POA: Diagnosis not present

## 2015-07-15 LAB — CBC WITH DIFFERENTIAL/PLATELET
Basophils Absolute: 0 10*3/uL (ref 0.0–0.1)
Basophils Relative: 0.5 % (ref 0.0–3.0)
EOS PCT: 2.7 % (ref 0.0–5.0)
Eosinophils Absolute: 0.2 10*3/uL (ref 0.0–0.7)
HCT: 30.4 % — ABNORMAL LOW (ref 39.0–52.0)
Hemoglobin: 10 g/dL — ABNORMAL LOW (ref 13.0–17.0)
LYMPHS ABS: 1.1 10*3/uL (ref 0.7–4.0)
Lymphocytes Relative: 16.9 % (ref 12.0–46.0)
MCHC: 32.9 g/dL (ref 30.0–36.0)
MCV: 90.4 fl (ref 78.0–100.0)
MONO ABS: 0.9 10*3/uL (ref 0.1–1.0)
MONOS PCT: 13.9 % — AB (ref 3.0–12.0)
NEUTROS ABS: 4.4 10*3/uL (ref 1.4–7.7)
NEUTROS PCT: 66 % (ref 43.0–77.0)
PLATELETS: 197 10*3/uL (ref 150.0–400.0)
RBC: 3.36 Mil/uL — ABNORMAL LOW (ref 4.22–5.81)
RDW: 19 % — AB (ref 11.5–15.5)
WBC: 6.7 10*3/uL (ref 4.0–10.5)

## 2015-07-15 LAB — BASIC METABOLIC PANEL
BUN: 23 mg/dL (ref 6–23)
CALCIUM: 9.6 mg/dL (ref 8.4–10.5)
CO2: 36 mEq/L — ABNORMAL HIGH (ref 19–32)
CREATININE: 6.45 mg/dL — AB (ref 0.40–1.50)
Chloride: 100 mEq/L (ref 96–112)
GFR: 10.65 mL/min — AB (ref >60.00)
Glucose, Bld: 98 mg/dL (ref 70–99)
Potassium: 4.7 mEq/L (ref 3.5–5.1)
SODIUM: 143 meq/L (ref 135–145)

## 2015-07-15 MED ORDER — CITALOPRAM HYDROBROMIDE 10 MG PO TABS: 10.0000 mg | ORAL_TABLET | Freq: Every day | ORAL | Status: DC

## 2015-07-15 MED ORDER — ATENOLOL 50 MG PO TABS: 50.0000 mg | ORAL_TABLET | Freq: Every day | ORAL | Status: DC

## 2015-07-15 NOTE — Assessment & Plan Note (Signed)
Stable,  to f/u any worsening symptoms or concerns, cont asa/statin

## 2015-07-15 NOTE — Telephone Encounter (Signed)
Critical Value - Creatinine 8.45 GFR 10.65 BUN 23

## 2015-07-15 NOTE — Assessment & Plan Note (Signed)
stable overall by history and exam, recent data reviewed with pt, and pt to continue medical treatment as before,  to f/u any worsening symptoms or concerns Lab Results  Component Value Date   LDLCALC 98 07/05/2015

## 2015-07-15 NOTE — Assessment & Plan Note (Signed)
For f/u cbc today, o/w stable, to cont asa for now

## 2015-07-15 NOTE — Progress Notes (Signed)
Subjective:    Patient ID: [name], male    DOB: [birth year], 81 y.o.   MRN: [id number]  HPI  Here to f/u; overall doing ok,  Pt denies chest pain, increasing sob or doe, wheezing, orthopnea, PND, increased LE swelling, palpitations, dizziness or syncope.  Pt denies new neurological symptoms such as new headache, or facial or extremity weakness or numbness.  Pt denies polydipsia, polyuria, or low sugar episode.   Pt denies new neurological symptoms such as new headache, or facial or extremity weakness or numbness.   Pt states overall good compliance with meds, mostly trying to follow appropriate diet, with wt overall stable,  but little exercise however. S/p right brain CVA with mild LUE weakness residual no change. No further BRBPR on ASA since d/c.  Cont's HD per renal.  Atenolol held at d/c due to lower BP Past Medical History  Diagnosis Date  . ANEMIA-IRON DEFICIENCY 03/09/2008  . DIVERTICULOSIS, COLON 03/09/2008  . GOUT 03/09/2008  . HYPERLIPIDEMIA 03/09/2008  . HYPERTENSION 03/09/2008  . PROSTATE CANCER, HX OF 03/09/2008  . ESRD (end stage renal disease) 03/09/2008  . Complex renal cyst 06/19/2011  . Hyperparathyroidism   . Prostate cancer 2002    Completed external beam radiation 2003.per HPI  . Arthritis     cervical spine.   Marland Kitchen History of blood transfusion   . Depression 03/26/2014  . Hemorrhoids 08/2009    internal.   . Radiation cystitis 2010.   Past Surgical History  Procedure Laterality Date  . Tonsillectomy    . Rotator cuff repair right  4/08  . S/p left hip replacement  2007    Dr. Percell Miller ortho  . Joint replacement      HIP  . Av fistula placement Left 01/24/2013    Procedure: INSERTION OF ARTERIOVENOUS (AV) GORE-TEX GRAFT ARM;  Surgeon: Rosetta Posner, MD;  Location: [place];  Service: Vascular;  Laterality: Left;  . Flexible sigmoidoscopy N/A 06/15/2015    Procedure: FLEXIBLE SIGMOIDOSCOPY;  Surgeon: Gatha Mayer, MD;  Location: [place];  Service: Endoscopy;  Laterality:  N/A;  . Colonoscopy N/A 06/17/2015    Procedure: COLONOSCOPY;  Surgeon: Gatha Mayer, MD;  Location: [place];  Service: Endoscopy;  Laterality: N/A;  . Esophagogastroduodenoscopy N/A 06/17/2015    Procedure: ESOPHAGOGASTRODUODENOSCOPY (EGD);  Surgeon: Gatha Mayer, MD;  Location[facility] END[facility]COPY;  Service: Endoscopy;  Laterality: N/A;  . Givens capsule study N/A 07/06/2015    Procedure: GIVENS CAPSULE STUDY;  Surgeon: Milus Banister, MD;  Location: [place];  Service: Endoscopy;  Laterality: N/A;    reports that he quit smoking about 32 years ago. His smoking use included Cigarettes. He quit after 5 years of use. He has never used smokeless tobacco. He reports that he does not drink alcohol or use illicit drugs. family history includes Cancer in his father; Diabetes in his father; Hypertension in his brother and father. No Known Allergies Current Outpatient Prescriptions on File Prior to Visit  Medication Sig Dispense Refill  . allopurinol (ZYLOPRIM) 100 MG tablet Take 1 tablet (100 mg total) by mouth daily. 90 tablet 3  . aspirin 81 MG tablet Take 1 tablet (81 mg total) by mouth daily.    . citalopram (CELEXA) 10 MG tablet Take 1 tablet (10 mg total) by mouth daily. 90 tablet 3  . docusate sodium (COLACE) 100 MG capsule Take 2 capsules (200 mg total) by mouth 2 (two) times daily. 60 capsule 0  . doxercalciferol (  HECTOROL) 0.5 MCG capsule Take 1.5 mcg by mouth 3 (three) times a week. MWF    . Nutritional Supplements (FEEDING SUPPLEMENT, NEPRO CARB STEADY,) LIQD Take 237 mLs by mouth as needed (missed meal during dialysis.). 30 Can 0  . pantoprazole (PROTONIX) 40 MG tablet Take 1 tablet (40 mg total) by mouth daily at 6 (six) AM. 30 tablet 0  . rosuvastatin (CRESTOR) 20 MG tablet Take 1 tablet (20 mg total) by mouth at bedtime. 90 tablet 3  . Specialty Vitamins Products (ONE-A-DAY ENERGY FORMULA PO) Take 1 capsule by mouth daily.    Marland Kitchen tiZANidine (ZANAFLEX) 4 MG tablet Take 1 tablet (4 mg  total) by mouth every 6 (six) hours as needed for muscle spasms. 30 tablet 0   No current facility-administered medications on file prior to visit.   Review of Systems  Constitutional: Negative for unusual diaphoresis or night sweats HENT: Negative for ringing in ear or discharge Eyes: Negative for double vision or worsening visual disturbance.  Respiratory: Negative for choking and stridor.   Gastrointestinal: Negative for vomiting or other signifcant bowel change Genitourinary: Negative for hematuria or change in urine volume.  Musculoskeletal: Negative for other MSK pain or swelling Skin: Negative for color change and worsening wound.  Neurological: Negative for tremors and numbness other than noted  Psychiatric/Behavioral: Negative for decreased concentration or agitation other than above       Objective:   Physical Exam BP 148/52 mmHg  Pulse 77  Temp(Src) 97.8 F (36.6 C) (Oral)  Ht 5\' 8"  (1.727 m)  Wt 148 lb (67.132 kg)  BMI 22.51 kg/m2  SpO2 98% VS noted,  Constitutional: Pt appears in no significant distress HENT: Head: NCAT.  Right Ear: External ear normal.  Left Ear: External ear normal.  Eyes: . Pupils are equal, round, and reactive to light. Conjunctivae and EOM are normal Neck: Normal range of motion. Neck supple.  Cardiovascular: Normal rate and regular rhythm.   Pulmonary/Chest: Effort normal and breath sounds without rales or wheezing.  LUE with + thrill Neurological: Pt is alert. Not confused , motor grossly intact except 4+/5 LUE Skin: Skin is warm. No rash, no LE edema Psychiatric: Pt behavior is normal. No agitation.     Assessment & Plan:

## 2015-07-15 NOTE — Assessment & Plan Note (Signed)
Improved, ok re-start atenolol 50 qd

## 2015-07-15 NOTE — Addendum Note (Signed)
Addended by: Lyman Bishop on: 07/15/2015 03:38 PM   Modules accepted: Orders

## 2015-07-15 NOTE — Patient Instructions (Signed)
OK to re-start the atenolol at 50 mg per day  Please continue all other medications as before, and refills have been done if requested.  Please have the pharmacy call with any other refills you may need.  Please continue your efforts at being more active, low cholesterol diet, and weight control.  You are otherwise up to date with prevention measures today.  Please keep your appointments with your specialists as you may have planned  Please go to the LAB in the Basement (turn left off the elevator) for the tests to be done today  You will be contacted by phone if any changes need to be made immediately.  Otherwise, you will receive a letter about your results with an explanation, but please check with MyChart first.  Please remember to sign up for MyChart if you have not done so, as this will be important to you in the future with finding out test results, communicating by private email, and scheduling acute appointments online when needed.

## 2015-07-15 NOTE — Progress Notes (Signed)
Pre visit review using our clinic review tool, if applicable. No additional management support is needed unless otherwise documented below in the visit note. 

## 2015-07-16 ENCOUNTER — Encounter: Payer: Self-pay | Admitting: Internal Medicine

## 2015-07-16 ENCOUNTER — Telehealth: Payer: Self-pay | Admitting: Gastroenterology

## 2015-07-16 DIAGNOSIS — D62 Acute posthemorrhagic anemia: Secondary | ICD-10-CM | POA: Diagnosis not present

## 2015-07-16 DIAGNOSIS — K5731 Diverticulosis of large intestine without perforation or abscess with bleeding: Secondary | ICD-10-CM | POA: Diagnosis not present

## 2015-07-16 DIAGNOSIS — N186 End stage renal disease: Secondary | ICD-10-CM | POA: Diagnosis not present

## 2015-07-16 DIAGNOSIS — K2970 Gastritis, unspecified, without bleeding: Secondary | ICD-10-CM | POA: Diagnosis not present

## 2015-07-16 DIAGNOSIS — D631 Anemia in chronic kidney disease: Secondary | ICD-10-CM | POA: Diagnosis not present

## 2015-07-16 DIAGNOSIS — D124 Benign neoplasm of descending colon: Secondary | ICD-10-CM | POA: Diagnosis not present

## 2015-07-16 DIAGNOSIS — N2581 Secondary hyperparathyroidism of renal origin: Secondary | ICD-10-CM | POA: Diagnosis not present

## 2015-07-16 DIAGNOSIS — I120 Hypertensive chronic kidney disease with stage 5 chronic kidney disease or end stage renal disease: Secondary | ICD-10-CM | POA: Diagnosis not present

## 2015-07-16 NOTE — Telephone Encounter (Signed)
Please explain that I do not think he needs this medication any more so do not refill.  Let me know if he has any further ?Rebekah Chesterfield

## 2015-07-16 NOTE — Telephone Encounter (Signed)
Left detailed voice mail message regarding the refill request and left our # to call back with any other questions.

## 2015-07-16 NOTE — Telephone Encounter (Signed)
Ok to refill Sir? 

## 2015-07-19 DIAGNOSIS — D631 Anemia in chronic kidney disease: Secondary | ICD-10-CM | POA: Diagnosis not present

## 2015-07-19 DIAGNOSIS — N186 End stage renal disease: Secondary | ICD-10-CM | POA: Diagnosis not present

## 2015-07-19 DIAGNOSIS — N2581 Secondary hyperparathyroidism of renal origin: Secondary | ICD-10-CM | POA: Diagnosis not present

## 2015-07-20 DIAGNOSIS — I120 Hypertensive chronic kidney disease with stage 5 chronic kidney disease or end stage renal disease: Secondary | ICD-10-CM | POA: Diagnosis not present

## 2015-07-20 DIAGNOSIS — K5731 Diverticulosis of large intestine without perforation or abscess with bleeding: Secondary | ICD-10-CM | POA: Diagnosis not present

## 2015-07-20 DIAGNOSIS — K2970 Gastritis, unspecified, without bleeding: Secondary | ICD-10-CM | POA: Diagnosis not present

## 2015-07-20 DIAGNOSIS — D124 Benign neoplasm of descending colon: Secondary | ICD-10-CM | POA: Diagnosis not present

## 2015-07-20 DIAGNOSIS — D62 Acute posthemorrhagic anemia: Secondary | ICD-10-CM | POA: Diagnosis not present

## 2015-07-20 DIAGNOSIS — N186 End stage renal disease: Secondary | ICD-10-CM | POA: Diagnosis not present

## 2015-07-21 DIAGNOSIS — N186 End stage renal disease: Secondary | ICD-10-CM | POA: Diagnosis not present

## 2015-07-21 DIAGNOSIS — N2581 Secondary hyperparathyroidism of renal origin: Secondary | ICD-10-CM | POA: Diagnosis not present

## 2015-07-21 DIAGNOSIS — D631 Anemia in chronic kidney disease: Secondary | ICD-10-CM | POA: Diagnosis not present

## 2015-07-22 DIAGNOSIS — I120 Hypertensive chronic kidney disease with stage 5 chronic kidney disease or end stage renal disease: Secondary | ICD-10-CM | POA: Diagnosis not present

## 2015-07-22 DIAGNOSIS — K2970 Gastritis, unspecified, without bleeding: Secondary | ICD-10-CM | POA: Diagnosis not present

## 2015-07-22 DIAGNOSIS — N186 End stage renal disease: Secondary | ICD-10-CM | POA: Diagnosis not present

## 2015-07-22 DIAGNOSIS — K5731 Diverticulosis of large intestine without perforation or abscess with bleeding: Secondary | ICD-10-CM | POA: Diagnosis not present

## 2015-07-22 DIAGNOSIS — D124 Benign neoplasm of descending colon: Secondary | ICD-10-CM | POA: Diagnosis not present

## 2015-07-22 DIAGNOSIS — D62 Acute posthemorrhagic anemia: Secondary | ICD-10-CM | POA: Diagnosis not present

## 2015-07-23 ENCOUNTER — Telehealth: Payer: Self-pay | Admitting: *Deleted

## 2015-07-23 DIAGNOSIS — K2970 Gastritis, unspecified, without bleeding: Secondary | ICD-10-CM | POA: Diagnosis not present

## 2015-07-23 DIAGNOSIS — K5731 Diverticulosis of large intestine without perforation or abscess with bleeding: Secondary | ICD-10-CM | POA: Diagnosis not present

## 2015-07-23 DIAGNOSIS — I120 Hypertensive chronic kidney disease with stage 5 chronic kidney disease or end stage renal disease: Secondary | ICD-10-CM | POA: Diagnosis not present

## 2015-07-23 DIAGNOSIS — D631 Anemia in chronic kidney disease: Secondary | ICD-10-CM | POA: Diagnosis not present

## 2015-07-23 DIAGNOSIS — N186 End stage renal disease: Secondary | ICD-10-CM | POA: Diagnosis not present

## 2015-07-23 DIAGNOSIS — N2581 Secondary hyperparathyroidism of renal origin: Secondary | ICD-10-CM | POA: Diagnosis not present

## 2015-07-23 DIAGNOSIS — D124 Benign neoplasm of descending colon: Secondary | ICD-10-CM | POA: Diagnosis not present

## 2015-07-23 DIAGNOSIS — D62 Acute posthemorrhagic anemia: Secondary | ICD-10-CM | POA: Diagnosis not present

## 2015-07-23 MED ORDER — CITALOPRAM HYDROBROMIDE 10 MG PO TABS: 10.0000 mg | ORAL_TABLET | Freq: Every day | ORAL | Status: DC

## 2015-07-23 NOTE — Telephone Encounter (Signed)
Receive call from pt wife she states md suppose to have sent rx for pt celexa to [facility]. He is out and they haven't receive anything from mail order. Inform wife celexa was sent to [facility] electronically on 07/15/15 will fax script to them, and send a [facility] day supply [facility]...Johny Chess

## 2015-07-26 DIAGNOSIS — N186 End stage renal disease: Secondary | ICD-10-CM | POA: Diagnosis not present

## 2015-07-26 DIAGNOSIS — N2581 Secondary hyperparathyroidism of renal origin: Secondary | ICD-10-CM | POA: Diagnosis not present

## 2015-07-26 DIAGNOSIS — D631 Anemia in chronic kidney disease: Secondary | ICD-10-CM | POA: Diagnosis not present

## 2015-07-27 DIAGNOSIS — K2970 Gastritis, unspecified, without bleeding: Secondary | ICD-10-CM | POA: Diagnosis not present

## 2015-07-27 DIAGNOSIS — D62 Acute posthemorrhagic anemia: Secondary | ICD-10-CM | POA: Diagnosis not present

## 2015-07-27 DIAGNOSIS — N186 End stage renal disease: Secondary | ICD-10-CM | POA: Diagnosis not present

## 2015-07-27 DIAGNOSIS — D124 Benign neoplasm of descending colon: Secondary | ICD-10-CM | POA: Diagnosis not present

## 2015-07-27 DIAGNOSIS — K5731 Diverticulosis of large intestine without perforation or abscess with bleeding: Secondary | ICD-10-CM | POA: Diagnosis not present

## 2015-07-27 DIAGNOSIS — I120 Hypertensive chronic kidney disease with stage 5 chronic kidney disease or end stage renal disease: Secondary | ICD-10-CM | POA: Diagnosis not present

## 2015-07-28 DIAGNOSIS — N2581 Secondary hyperparathyroidism of renal origin: Secondary | ICD-10-CM | POA: Diagnosis not present

## 2015-07-28 DIAGNOSIS — N186 End stage renal disease: Secondary | ICD-10-CM | POA: Diagnosis not present

## 2015-07-28 DIAGNOSIS — D631 Anemia in chronic kidney disease: Secondary | ICD-10-CM | POA: Diagnosis not present

## 2015-07-29 DIAGNOSIS — N186 End stage renal disease: Secondary | ICD-10-CM | POA: Diagnosis not present

## 2015-07-29 DIAGNOSIS — Z992 Dependence on renal dialysis: Secondary | ICD-10-CM | POA: Diagnosis not present

## 2015-07-29 DIAGNOSIS — D62 Acute posthemorrhagic anemia: Secondary | ICD-10-CM | POA: Diagnosis not present

## 2015-07-29 DIAGNOSIS — T82848A Pain from vascular prosthetic devices, implants and grafts, initial encounter: Secondary | ICD-10-CM | POA: Diagnosis not present

## 2015-07-29 DIAGNOSIS — D124 Benign neoplasm of descending colon: Secondary | ICD-10-CM | POA: Diagnosis not present

## 2015-07-29 DIAGNOSIS — K2970 Gastritis, unspecified, without bleeding: Secondary | ICD-10-CM | POA: Diagnosis not present

## 2015-07-29 DIAGNOSIS — M79602 Pain in left arm: Secondary | ICD-10-CM | POA: Diagnosis not present

## 2015-07-29 DIAGNOSIS — K5731 Diverticulosis of large intestine without perforation or abscess with bleeding: Secondary | ICD-10-CM | POA: Diagnosis not present

## 2015-07-29 DIAGNOSIS — I120 Hypertensive chronic kidney disease with stage 5 chronic kidney disease or end stage renal disease: Secondary | ICD-10-CM | POA: Diagnosis not present

## 2015-07-30 DIAGNOSIS — N186 End stage renal disease: Secondary | ICD-10-CM | POA: Diagnosis not present

## 2015-07-30 DIAGNOSIS — N2581 Secondary hyperparathyroidism of renal origin: Secondary | ICD-10-CM | POA: Diagnosis not present

## 2015-07-30 DIAGNOSIS — D631 Anemia in chronic kidney disease: Secondary | ICD-10-CM | POA: Diagnosis not present

## 2015-08-02 ENCOUNTER — Telehealth: Payer: Self-pay | Admitting: Internal Medicine

## 2015-08-02 DIAGNOSIS — D631 Anemia in chronic kidney disease: Secondary | ICD-10-CM | POA: Diagnosis not present

## 2015-08-02 DIAGNOSIS — N186 End stage renal disease: Secondary | ICD-10-CM | POA: Diagnosis not present

## 2015-08-02 DIAGNOSIS — N2581 Secondary hyperparathyroidism of renal origin: Secondary | ICD-10-CM | POA: Diagnosis not present

## 2015-08-02 MED ORDER — ATENOLOL 50 MG PO TABS: 50.0000 mg | ORAL_TABLET | Freq: Every day | ORAL | Status: DC

## 2015-08-02 NOTE — Telephone Encounter (Signed)
Called wife back clarified which BP med that husband suppose to be on. When pt saw md 07/15/15 he ok for pt to start back on Atenolol 50 mg. Pt states he doesn't have any atenolol. Inform pt md sent rx to [facility] on 07/15/15 & she stated that she haven't received. Inform pt will [facility], and i will call [facility] to check status on meds....[name]

## 2015-08-02 NOTE — Telephone Encounter (Signed)
Pt's spouse called request to speak to the assistant or the triage nurse concern about [name] BP medication. Pt stated that when he got out of the hospital he was told to continue amlodipin 5 mg but he also on other BP medication as well (cant remember the name of it). Please call pt back,concern about 2 BP medication might have interaction.

## 2015-08-03 DIAGNOSIS — K5731 Diverticulosis of large intestine without perforation or abscess with bleeding: Secondary | ICD-10-CM | POA: Diagnosis not present

## 2015-08-03 DIAGNOSIS — D124 Benign neoplasm of descending colon: Secondary | ICD-10-CM | POA: Diagnosis not present

## 2015-08-03 DIAGNOSIS — D62 Acute posthemorrhagic anemia: Secondary | ICD-10-CM | POA: Diagnosis not present

## 2015-08-03 DIAGNOSIS — I120 Hypertensive chronic kidney disease with stage 5 chronic kidney disease or end stage renal disease: Secondary | ICD-10-CM | POA: Diagnosis not present

## 2015-08-03 DIAGNOSIS — N186 End stage renal disease: Secondary | ICD-10-CM | POA: Diagnosis not present

## 2015-08-03 DIAGNOSIS — K2970 Gastritis, unspecified, without bleeding: Secondary | ICD-10-CM | POA: Diagnosis not present

## 2015-08-03 NOTE — Telephone Encounter (Signed)
Called [facility] spoke with rep. Verified if they received script for citalopram on 07/23/15. Pharmacist tech Magda Paganini stated that yes per their records med was ship yesterday. Gave verbal order for atenolol. Notified pt wife informher status on meds...[name]

## 2015-08-04 DIAGNOSIS — N186 End stage renal disease: Secondary | ICD-10-CM | POA: Diagnosis not present

## 2015-08-04 DIAGNOSIS — N2581 Secondary hyperparathyroidism of renal origin: Secondary | ICD-10-CM | POA: Diagnosis not present

## 2015-08-04 DIAGNOSIS — D631 Anemia in chronic kidney disease: Secondary | ICD-10-CM | POA: Diagnosis not present

## 2015-08-05 ENCOUNTER — Telehealth: Payer: Self-pay | Admitting: Internal Medicine

## 2015-08-05 DIAGNOSIS — D62 Acute posthemorrhagic anemia: Secondary | ICD-10-CM | POA: Diagnosis not present

## 2015-08-05 DIAGNOSIS — D124 Benign neoplasm of descending colon: Secondary | ICD-10-CM | POA: Diagnosis not present

## 2015-08-05 DIAGNOSIS — K2970 Gastritis, unspecified, without bleeding: Secondary | ICD-10-CM | POA: Diagnosis not present

## 2015-08-05 DIAGNOSIS — N186 End stage renal disease: Secondary | ICD-10-CM | POA: Diagnosis not present

## 2015-08-05 DIAGNOSIS — K5731 Diverticulosis of large intestine without perforation or abscess with bleeding: Secondary | ICD-10-CM | POA: Diagnosis not present

## 2015-08-05 DIAGNOSIS — I120 Hypertensive chronic kidney disease with stage 5 chronic kidney disease or end stage renal disease: Secondary | ICD-10-CM | POA: Diagnosis not present

## 2015-08-05 NOTE — Telephone Encounter (Signed)
Patient would like to know when he can drive again.

## 2015-08-05 NOTE — Telephone Encounter (Signed)
We will need to see at the next visit, but it was not a good idea at last visit after the stroke

## 2015-08-05 NOTE — Telephone Encounter (Signed)
Pt's spouse advised 

## 2015-08-06 ENCOUNTER — Encounter: Payer: Self-pay | Admitting: Vascular Surgery

## 2015-08-06 DIAGNOSIS — K2970 Gastritis, unspecified, without bleeding: Secondary | ICD-10-CM | POA: Diagnosis not present

## 2015-08-06 DIAGNOSIS — K5731 Diverticulosis of large intestine without perforation or abscess with bleeding: Secondary | ICD-10-CM | POA: Diagnosis not present

## 2015-08-06 DIAGNOSIS — N186 End stage renal disease: Secondary | ICD-10-CM | POA: Diagnosis not present

## 2015-08-06 DIAGNOSIS — Z992 Dependence on renal dialysis: Secondary | ICD-10-CM | POA: Diagnosis not present

## 2015-08-06 DIAGNOSIS — D62 Acute posthemorrhagic anemia: Secondary | ICD-10-CM | POA: Diagnosis not present

## 2015-08-06 DIAGNOSIS — I158 Other secondary hypertension: Secondary | ICD-10-CM | POA: Diagnosis not present

## 2015-08-06 DIAGNOSIS — I120 Hypertensive chronic kidney disease with stage 5 chronic kidney disease or end stage renal disease: Secondary | ICD-10-CM | POA: Diagnosis not present

## 2015-08-06 DIAGNOSIS — D631 Anemia in chronic kidney disease: Secondary | ICD-10-CM | POA: Diagnosis not present

## 2015-08-06 DIAGNOSIS — D124 Benign neoplasm of descending colon: Secondary | ICD-10-CM | POA: Diagnosis not present

## 2015-08-06 DIAGNOSIS — N2581 Secondary hyperparathyroidism of renal origin: Secondary | ICD-10-CM | POA: Diagnosis not present

## 2015-08-09 DIAGNOSIS — D509 Iron deficiency anemia, unspecified: Secondary | ICD-10-CM | POA: Diagnosis not present

## 2015-08-09 DIAGNOSIS — N2581 Secondary hyperparathyroidism of renal origin: Secondary | ICD-10-CM | POA: Diagnosis not present

## 2015-08-09 DIAGNOSIS — N186 End stage renal disease: Secondary | ICD-10-CM | POA: Diagnosis not present

## 2015-08-10 ENCOUNTER — Encounter: Payer: Self-pay | Admitting: Vascular Surgery

## 2015-08-10 ENCOUNTER — Ambulatory Visit (INDEPENDENT_AMBULATORY_CARE_PROVIDER_SITE_OTHER): Payer: Medicare Other | Admitting: Vascular Surgery

## 2015-08-10 VITALS — BP 133/62 | HR 57 | Ht 68.0 in | Wt 147.0 lb

## 2015-08-10 DIAGNOSIS — K2970 Gastritis, unspecified, without bleeding: Secondary | ICD-10-CM | POA: Diagnosis not present

## 2015-08-10 DIAGNOSIS — Z992 Dependence on renal dialysis: Secondary | ICD-10-CM | POA: Diagnosis not present

## 2015-08-10 DIAGNOSIS — N186 End stage renal disease: Secondary | ICD-10-CM

## 2015-08-10 DIAGNOSIS — I639 Cerebral infarction, unspecified: Secondary | ICD-10-CM

## 2015-08-10 DIAGNOSIS — D124 Benign neoplasm of descending colon: Secondary | ICD-10-CM | POA: Diagnosis not present

## 2015-08-10 DIAGNOSIS — R2232 Localized swelling, mass and lump, left upper limb: Secondary | ICD-10-CM

## 2015-08-10 DIAGNOSIS — K5731 Diverticulosis of large intestine without perforation or abscess with bleeding: Secondary | ICD-10-CM | POA: Diagnosis not present

## 2015-08-10 DIAGNOSIS — I120 Hypertensive chronic kidney disease with stage 5 chronic kidney disease or end stage renal disease: Secondary | ICD-10-CM | POA: Diagnosis not present

## 2015-08-10 DIAGNOSIS — D62 Acute posthemorrhagic anemia: Secondary | ICD-10-CM | POA: Diagnosis not present

## 2015-08-10 NOTE — Progress Notes (Signed)
Patient is today for concern regarding several issues in his left hand. He is known to me from placement of a left forearm loop graft for hemodialysis in March 2014. He reports that he had the graft for proximal one year prior to initiation for hemodialysis. He has had at least one episode of thrombosis requiring percutaneous treatment since initiation of hemodialysis. His concern now is of his hand. He has a effusion and pain and swelling in the metacarpal phalangeal joint on his index finger on the left. This is tender to the touch. He does report that he can have numbness in both hands and this seems to be worse on his left than on his right. He also reports this is worse on hemodialysis. He reports that his blood pressure control into the 0000000 to [id number] systolic with hemodialysis and does have more pain when his pressure is down.  Past Medical History  Diagnosis Date  . ANEMIA-IRON DEFICIENCY 03/09/2008  . DIVERTICULOSIS, COLON 03/09/2008  . GOUT 03/09/2008  . HYPERLIPIDEMIA 03/09/2008  . HYPERTENSION 03/09/2008  . PROSTATE CANCER, HX OF 03/09/2008  . ESRD (end stage renal disease) ([place]) 03/09/2008  . Complex renal cyst 06/19/2011  . Hyperparathyroidism ([place])   . Prostate cancer [facility]) 2002    Completed external beam radiation 2003.per HPI  . Arthritis     cervical spine.   Marland Kitchen History of blood transfusion   . Depression 03/26/2014  . Hemorrhoids 08/2009    internal.   . Radiation cystitis 2010.  Marland Kitchen Stroke [facility])     Social History  Substance Use Topics  . Smoking status: Former Smoker -- 5 years    Types: Cigarettes    Quit date: 09/17/1982  . Smokeless tobacco: Never Used  . Alcohol Use: No     Comment: rare    Family History  Problem Relation Age of Onset  . Hypertension Father   . Diabetes Father   . Cancer Father     prostate cancer  . Hypertension Brother   . Cancer Brother     No Known Allergies   Current outpatient prescriptions:  .  allopurinol (ZYLOPRIM) 100 MG tablet, Take 1  tablet (100 mg total) by mouth daily., Disp: 90 tablet, Rfl: 3 .  aspirin 81 MG tablet, Take 1 tablet (81 mg total) by mouth daily., Disp: , Rfl:  .  atenolol (TENORMIN) 50 MG tablet, Take 1 tablet (50 mg total) by mouth daily., Disp: 10 tablet, Rfl: 0 .  citalopram (CELEXA) 10 MG tablet, Take 1 tablet (10 mg total) by mouth daily., Disp: 10 tablet, Rfl: 0 .  docusate sodium (COLACE) 100 MG capsule, Take 2 capsules (200 mg total) by mouth 2 (two) times daily., Disp: 60 capsule, Rfl: 0 .  doxercalciferol (HECTOROL) 0.5 MCG capsule, Take 1.5 mcg by mouth 3 (three) times a week. MWF, Disp: , Rfl:  .  Nutritional Supplements (FEEDING SUPPLEMENT, NEPRO CARB STEADY,) LIQD, Take 237 mLs by mouth as needed (missed meal during dialysis.)., Disp: 30 Can, Rfl: 0 .  pantoprazole (PROTONIX) 40 MG tablet, Take 1 tablet (40 mg total) by mouth daily at 6 (six) AM., Disp: 30 tablet, Rfl: 0 .  rosuvastatin (CRESTOR) 20 MG tablet, Take 1 tablet (20 mg total) by mouth at bedtime., Disp: 90 tablet, Rfl: 3 .  Specialty Vitamins Products (ONE-A-DAY ENERGY FORMULA PO), Take 1 capsule by mouth daily., Disp: , Rfl:  .  tiZANidine (ZANAFLEX) 4 MG tablet, Take 1 tablet (4 mg total) by mouth every  6 (six) hours as needed for muscle spasms., Disp: 30 tablet, Rfl: 0  Filed Vitals:   08/10/15 0927  BP: 133/62  Pulse: 57  Height: 5\' 8"  (1.727 m)  Weight: 147 lb (66.679 kg)  SpO2: 100%    Body mass index is 22.36 kg/(m^2).       On physical exam his left forearm loop graft is healed quite nicely with an excellent thrill. He does have an easily palpable radial pulse with his graft patent. I do not sense any specific augmentation with his graft occluded. He does have audible digital flow at the level of his index finger by Doppler with his graft occluded and with the graft patent.  He does have reverse changes over his metacarpal phalangeal joint of his second finger with some tenderness and effusion at this level.  He  did undergo a recent outpatient fistulogram and I have this for review. This does show patency of his radial artery and palmar arch with main runoff into his hand. The radial artery. No evidence of technical difficulty with his graft.  Impression and plan possible steal symptoms associated with his graft. He reports this is difficult to tolerate while on hemodialysis. I do not feel this has any relationship to what is going on with his index finger. We have coordinated hand specialty consultation tomorrow for further evaluation of this. We will continue to observe his steal symptoms until his hand issues are resolved to determine if it is significant enough to report further treatment. Explained his only option would be attempted plication of his fistula which may or may not improve his symptoms. It is concerning that he is having symptoms in his right hand as well with his extreme hypotension with hemodialysis.

## 2015-08-11 DIAGNOSIS — M152 Bouchard's nodes (with arthropathy): Secondary | ICD-10-CM | POA: Diagnosis not present

## 2015-08-11 DIAGNOSIS — D509 Iron deficiency anemia, unspecified: Secondary | ICD-10-CM | POA: Diagnosis not present

## 2015-08-11 DIAGNOSIS — M151 Heberden's nodes (with arthropathy): Secondary | ICD-10-CM | POA: Diagnosis not present

## 2015-08-11 DIAGNOSIS — N186 End stage renal disease: Secondary | ICD-10-CM | POA: Diagnosis not present

## 2015-08-11 DIAGNOSIS — D2112 Benign neoplasm of connective and other soft tissue of left upper limb, including shoulder: Secondary | ICD-10-CM | POA: Diagnosis not present

## 2015-08-11 DIAGNOSIS — N2581 Secondary hyperparathyroidism of renal origin: Secondary | ICD-10-CM | POA: Diagnosis not present

## 2015-08-12 ENCOUNTER — Other Ambulatory Visit: Payer: Medicare Other

## 2015-08-13 DIAGNOSIS — N2581 Secondary hyperparathyroidism of renal origin: Secondary | ICD-10-CM | POA: Diagnosis not present

## 2015-08-13 DIAGNOSIS — D509 Iron deficiency anemia, unspecified: Secondary | ICD-10-CM | POA: Diagnosis not present

## 2015-08-13 DIAGNOSIS — N186 End stage renal disease: Secondary | ICD-10-CM | POA: Diagnosis not present

## 2015-08-16 DIAGNOSIS — N186 End stage renal disease: Secondary | ICD-10-CM | POA: Diagnosis not present

## 2015-08-16 DIAGNOSIS — D2112 Benign neoplasm of connective and other soft tissue of left upper limb, including shoulder: Secondary | ICD-10-CM | POA: Diagnosis not present

## 2015-08-16 DIAGNOSIS — M151 Heberden's nodes (with arthropathy): Secondary | ICD-10-CM | POA: Diagnosis not present

## 2015-08-16 DIAGNOSIS — M152 Bouchard's nodes (with arthropathy): Secondary | ICD-10-CM | POA: Diagnosis not present

## 2015-08-16 DIAGNOSIS — D509 Iron deficiency anemia, unspecified: Secondary | ICD-10-CM | POA: Diagnosis not present

## 2015-08-16 DIAGNOSIS — N2581 Secondary hyperparathyroidism of renal origin: Secondary | ICD-10-CM | POA: Diagnosis not present

## 2015-08-17 DIAGNOSIS — Z992 Dependence on renal dialysis: Secondary | ICD-10-CM | POA: Diagnosis not present

## 2015-08-17 DIAGNOSIS — R2231 Localized swelling, mass and lump, right upper limb: Secondary | ICD-10-CM | POA: Diagnosis not present

## 2015-08-18 DIAGNOSIS — N2581 Secondary hyperparathyroidism of renal origin: Secondary | ICD-10-CM | POA: Diagnosis not present

## 2015-08-18 DIAGNOSIS — K5731 Diverticulosis of large intestine without perforation or abscess with bleeding: Secondary | ICD-10-CM | POA: Diagnosis not present

## 2015-08-18 DIAGNOSIS — I120 Hypertensive chronic kidney disease with stage 5 chronic kidney disease or end stage renal disease: Secondary | ICD-10-CM | POA: Diagnosis not present

## 2015-08-18 DIAGNOSIS — D62 Acute posthemorrhagic anemia: Secondary | ICD-10-CM | POA: Diagnosis not present

## 2015-08-18 DIAGNOSIS — D509 Iron deficiency anemia, unspecified: Secondary | ICD-10-CM | POA: Diagnosis not present

## 2015-08-18 DIAGNOSIS — D124 Benign neoplasm of descending colon: Secondary | ICD-10-CM | POA: Diagnosis not present

## 2015-08-18 DIAGNOSIS — N186 End stage renal disease: Secondary | ICD-10-CM | POA: Diagnosis not present

## 2015-08-18 DIAGNOSIS — K2970 Gastritis, unspecified, without bleeding: Secondary | ICD-10-CM | POA: Diagnosis not present

## 2015-08-20 DIAGNOSIS — N2581 Secondary hyperparathyroidism of renal origin: Secondary | ICD-10-CM | POA: Diagnosis not present

## 2015-08-20 DIAGNOSIS — N186 End stage renal disease: Secondary | ICD-10-CM | POA: Diagnosis not present

## 2015-08-20 DIAGNOSIS — D509 Iron deficiency anemia, unspecified: Secondary | ICD-10-CM | POA: Diagnosis not present

## 2015-08-23 DIAGNOSIS — D2112 Benign neoplasm of connective and other soft tissue of left upper limb, including shoulder: Secondary | ICD-10-CM | POA: Diagnosis not present

## 2015-08-23 DIAGNOSIS — N2581 Secondary hyperparathyroidism of renal origin: Secondary | ICD-10-CM | POA: Diagnosis not present

## 2015-08-23 DIAGNOSIS — D509 Iron deficiency anemia, unspecified: Secondary | ICD-10-CM | POA: Diagnosis not present

## 2015-08-23 DIAGNOSIS — M152 Bouchard's nodes (with arthropathy): Secondary | ICD-10-CM | POA: Diagnosis not present

## 2015-08-23 DIAGNOSIS — N186 End stage renal disease: Secondary | ICD-10-CM | POA: Diagnosis not present

## 2015-08-24 ENCOUNTER — Ambulatory Visit: Payer: Medicare Other | Admitting: Vascular Surgery

## 2015-08-25 DIAGNOSIS — N2581 Secondary hyperparathyroidism of renal origin: Secondary | ICD-10-CM | POA: Diagnosis not present

## 2015-08-25 DIAGNOSIS — D509 Iron deficiency anemia, unspecified: Secondary | ICD-10-CM | POA: Diagnosis not present

## 2015-08-25 DIAGNOSIS — N186 End stage renal disease: Secondary | ICD-10-CM | POA: Diagnosis not present

## 2015-08-26 ENCOUNTER — Other Ambulatory Visit: Payer: Self-pay | Admitting: Orthopedic Surgery

## 2015-08-27 DIAGNOSIS — D509 Iron deficiency anemia, unspecified: Secondary | ICD-10-CM | POA: Diagnosis not present

## 2015-08-27 DIAGNOSIS — N2581 Secondary hyperparathyroidism of renal origin: Secondary | ICD-10-CM | POA: Diagnosis not present

## 2015-08-27 DIAGNOSIS — N186 End stage renal disease: Secondary | ICD-10-CM | POA: Diagnosis not present

## 2015-08-30 DIAGNOSIS — D509 Iron deficiency anemia, unspecified: Secondary | ICD-10-CM | POA: Diagnosis not present

## 2015-08-30 DIAGNOSIS — N186 End stage renal disease: Secondary | ICD-10-CM | POA: Diagnosis not present

## 2015-08-30 DIAGNOSIS — N2581 Secondary hyperparathyroidism of renal origin: Secondary | ICD-10-CM | POA: Diagnosis not present

## 2015-09-01 DIAGNOSIS — N186 End stage renal disease: Secondary | ICD-10-CM | POA: Diagnosis not present

## 2015-09-01 DIAGNOSIS — N2581 Secondary hyperparathyroidism of renal origin: Secondary | ICD-10-CM | POA: Diagnosis not present

## 2015-09-01 DIAGNOSIS — D509 Iron deficiency anemia, unspecified: Secondary | ICD-10-CM | POA: Diagnosis not present

## 2015-09-01 DIAGNOSIS — C61 Malignant neoplasm of prostate: Secondary | ICD-10-CM | POA: Diagnosis not present

## 2015-09-03 DIAGNOSIS — N186 End stage renal disease: Secondary | ICD-10-CM | POA: Diagnosis not present

## 2015-09-03 DIAGNOSIS — N2581 Secondary hyperparathyroidism of renal origin: Secondary | ICD-10-CM | POA: Diagnosis not present

## 2015-09-03 DIAGNOSIS — D509 Iron deficiency anemia, unspecified: Secondary | ICD-10-CM | POA: Diagnosis not present

## 2015-09-06 DIAGNOSIS — N186 End stage renal disease: Secondary | ICD-10-CM | POA: Diagnosis not present

## 2015-09-06 DIAGNOSIS — N2581 Secondary hyperparathyroidism of renal origin: Secondary | ICD-10-CM | POA: Diagnosis not present

## 2015-09-06 DIAGNOSIS — I158 Other secondary hypertension: Secondary | ICD-10-CM | POA: Diagnosis not present

## 2015-09-06 DIAGNOSIS — D509 Iron deficiency anemia, unspecified: Secondary | ICD-10-CM | POA: Diagnosis not present

## 2015-09-06 DIAGNOSIS — Z992 Dependence on renal dialysis: Secondary | ICD-10-CM | POA: Diagnosis not present

## 2015-09-08 DIAGNOSIS — N2581 Secondary hyperparathyroidism of renal origin: Secondary | ICD-10-CM | POA: Diagnosis not present

## 2015-09-08 DIAGNOSIS — N186 End stage renal disease: Secondary | ICD-10-CM | POA: Diagnosis not present

## 2015-09-08 DIAGNOSIS — D509 Iron deficiency anemia, unspecified: Secondary | ICD-10-CM | POA: Diagnosis not present

## 2015-09-09 ENCOUNTER — Encounter: Payer: Self-pay | Admitting: Neurology

## 2015-09-09 ENCOUNTER — Ambulatory Visit (INDEPENDENT_AMBULATORY_CARE_PROVIDER_SITE_OTHER): Payer: Medicare Other | Admitting: Neurology

## 2015-09-09 VITALS — BP 98/50 | HR 60 | Ht 69.0 in | Wt 149.5 lb

## 2015-09-09 DIAGNOSIS — I63311 Cerebral infarction due to thrombosis of right middle cerebral artery: Secondary | ICD-10-CM

## 2015-09-09 DIAGNOSIS — Z8719 Personal history of other diseases of the digestive system: Secondary | ICD-10-CM

## 2015-09-09 DIAGNOSIS — N186 End stage renal disease: Secondary | ICD-10-CM | POA: Diagnosis not present

## 2015-09-09 DIAGNOSIS — I639 Cerebral infarction, unspecified: Secondary | ICD-10-CM

## 2015-09-09 DIAGNOSIS — E785 Hyperlipidemia, unspecified: Secondary | ICD-10-CM

## 2015-09-09 DIAGNOSIS — D500 Iron deficiency anemia secondary to blood loss (chronic): Secondary | ICD-10-CM

## 2015-09-09 DIAGNOSIS — Z992 Dependence on renal dialysis: Secondary | ICD-10-CM

## 2015-09-09 DIAGNOSIS — L80 Vitiligo: Secondary | ICD-10-CM | POA: Diagnosis not present

## 2015-09-09 NOTE — Progress Notes (Signed)
STROKE NEUROLOGY FOLLOW UP NOTE  NAME: [name] DOB: July 22, 1929  REASON FOR VISIT: stroke follow up HISTORY FROM: wife and chart  Today we had the pleasure of seeing [name] in follow-up at our Neurology Clinic. Pt was accompanied by wife.   History Summary [name] is a 81 y.o. male with history of ESRD on hemodialysis, colonic diverticulosis with LGIB requiring blood transfusion, hypertension and hyperlipidemia was admitted on 07/05/15 for left facial droop, weakness and fatigue after a brief episode of loss of consciousness. MRI showed left centrum semiovale subcortical infarct, likely due to small vessel disease. Stroke work up including TCD, TTE, CUS, venous doppler UE and LE, A1C all negative. LDL 98. Pt had several episodes of GIB with anemia requiring blood transfusion so that ASA was on hold initially. EGD/colonoscopy on 8/12 was negative, and Capsule Endoscopy negative as well. GI suspects a stuttering diverticular bleed. ASA was resumed after GI agreement. Pt was discharged with ASA and crestor.  Interval History During the interval time, the patient has been doing well. No more GIB. Continues on ASA without issue. His BP at home 112-114 but today 98/50. Pt asymptomatic. Denies any dizziness or feeling weak. atenolol discontinued so far. Continues to have HD for ESRD. Asking for driving.     REVIEW OF SYSTEMS: Full 14 system review of systems performed and notable only for those listed below and in HPI above, all others are negative:  Constitutional:   Cardiovascular:  Ear/Nose/Throat:   Skin:  Eyes:   Respiratory:   Gastroitestinal:   Genitourinary:  Hematology/Lymphatic:   Endocrine:  Musculoskeletal:   Allergy/Immunology:   Neurological:   Psychiatric:  Sleep:   The following represents the patient's updated allergies and side effects list: No Known Allergies  The neurologically relevant items on the patient's problem list were reviewed on  today's visit.  Neurologic Examination  A problem focused neurological exam (12 or more points of the single system neurologic examination, vital signs counts as 1 point, cranial nerves count for 8 points) was performed.  Blood pressure 98/50, pulse 60, height 5\' 9"  (1.753 m), weight 149 lb 8 oz (67.813 kg).  General - Well nourished, well developed, in no apparent distress.  Ophthalmologic - Fundi not visualized due to eye movement.  Cardiovascular - Regular rate and rhythm.  Mental Status -  Level of arousal and orientation to month, place, and person were intact, but not to year. Language including expression, naming, repetition, comprehension was assessed and found intact. Fund of Knowledge was assessed and was intact impaired.  Cranial Nerves II - XII - II - Visual field intact OU. III, IV, VI - Extraocular movements intact. V - Facial sensation intact bilaterally. VII - left nasolabial fold flattening. VIII - Hearing & vestibular intact bilaterally. X - Palate elevates symmetrically. XI - Chin turning & shoulder shrug intact bilaterally. XII - Tongue protrusion intact.  Motor Strength - The patient's strength was normal in all extremities and pronator drift was absent.  Bulk was normal and fasciculations were absent.   Motor Tone - Muscle tone was assessed at the neck and appendages and was normal.  Reflexes - The patient's reflexes were 1+ in all extremities and he had no pathological reflexes.  Sensory - Light touch, temperature/pinprick were assessed and were normal.    Coordination - The patient had normal movements in the hands and feet with no ataxia or dysmetria.  Tremor was absent.  Gait and Station -  The patient's transfers, gait, station, and turns were observed as normal except mild stooped posturing.  Data reviewed: I personally reviewed the images and agree with the radiology interpretations.  Mr Brain Wo Contrast 07/05/2015 1 x 1 x 2 cm area of  nonhemorrhagic acute infarction affecting the subcortical and periventricular deep white matter of the RIGHT centrum semiovale. Chronic changes as described.   CUS - Bilateral: 1-39% ICA stenosis. Vertebral artery flow is antegrade.  UE venous doppler - Bilateral: No evidence of DVT or superficial thrombosis. Left: Hemodialysis graft is patent, however, there is not complete color fill. No echoes within the graft to suggest thrombus.   LE venous doppler - negative for DVT bilaterally  2D echo - Left ventricle: The cavity size was normal. Systolic function washyperdynamic. The estimated ejection fraction was in the range of70% to 75%. There was dynamic obstruction in the mid cavity, witha peak velocity of 200 cm/sec and a peak gradient of 16 mm Hg.Wall motion was normal; there were no regional wall motionabnormalities. There was an increased relative contribution ofatrial contraction to ventricular filling, which may be due tohypovolemia. - Aortic valve: Valve area (VTI): 2.73 cm^2. Valve area (Vmax):2.05 cm^2. Valve area (Vmean): 2.14 cm^2. - Right ventricle: Systolic function was hyperdynamic. - Pulmonary arteries: PA peak pressure: 33 mm Hg (S). - Systemic veins: The inferior vena cava is collapsed. Impressions: Findings are consistent with hypovolemia.  TCD - This was a normal transcranial Doppler study, with normal flow direction and velocity of all identified vessels of the anterior and posterior circulations, with no evidence of stenosis, vasospasm or occlusion. There was no evidence of intracranial disease.  Component     Latest Ref Rng 07/05/2015  Cholesterol     0 - 200 mg/dL 188  Triglycerides     <150 mg/dL 123  HDL Cholesterol     >40 mg/dL 65  Total CHOL/HDL Ratio      2.9  VLDL     0 - 40 mg/dL 25  LDL (calc)     0 - 99 mg/dL 98  Hemoglobin A1C     4.8 - 5.6 % 4.9  Mean Plasma Glucose      94  TSH     0.350 - 4.500 uIU/mL 0.551  Free T4      0.61 - 1.12 ng/dL 1.00    Assessment: As you may recall, he is a 81 y.o. African American male with PMH of ESRD on hemodialysis, colonic diverticulosis with LGIB requiring blood transfusion, hypertension and hyperlipidemia was admitted on 07/05/15 for left centrum semiovale subcortical infarct, likely due to small vessel disease. Stroke work up including TCD, TTE, CUS, venous doppler UE and LE, A1C all negative. LDL 98. Pt had GIB with anemia requiring blood transfusion so that ASA was on hold initially. GI work up negative and ASA was resumed after GI agreement. Pt was discharged with ASA and crestor. During the interval time, no GIB. On ASA without issue. BP at low side but asymptomatic.  Plan:  - Continue ASA and crestor for stroke prevention - check BP at home - Follow up with your primary care physician for stroke risk factor modification. Recommend maintain blood pressure goal <130/80, diabetes with hemoglobin A1c goal below 6.5% and lipids with LDL cholesterol goal below 70 mg/dL.  - No restriction for driving, but recommend to drive with family members on board for 2-3 times initially. If both parties feel comfortable of your driving, you can drive alone after. However, you  are recommended to drive during the day not at night, no long distance and drive in familiar roads. - follow up in 6 months  I spent more than 25 minutes of face to face time with the patient. Greater than 50% of time was spent in counseling and coordination of care. We have discussed about watch for GIB, check BP at home and driving precautions   No orders of the defined types were placed in this encounter.    No orders of the defined types were placed in this encounter.    Patient Instructions  - Continue ASA and crestor for stroke prevention - check BP at home - watch for any sign of GIB - Follow up with your primary care physician for stroke risk factor modification. Recommend maintain blood pressure goal  <130/80, diabetes with hemoglobin A1c goal below 6.5% and lipids with LDL cholesterol goal below 70 mg/dL.  - No restriction for driving, but recommend to drive with family members on board for 2-3 times initially. If both parties feel comfortable of your driving, you can drive alone after. However, you are recommended to drive during the day not at night, no long distance and drive in familiar roads. - follow up in 6 months    Rosalin Hawking, MD PhD [facility] [address] [place], [place] [address] [phone]

## 2015-09-09 NOTE — Patient Instructions (Signed)
-   Continue ASA and crestor for stroke prevention - check BP at home - watch for any sign of GIB - Follow up with your primary care physician for stroke risk factor modification. Recommend maintain blood pressure goal <130/80, diabetes with hemoglobin A1c goal below 6.5% and lipids with LDL cholesterol goal below 70 mg/dL.  - No restriction for driving, but recommend to drive with family members on board for 2-3 times initially. If both parties feel comfortable of your driving, you can drive alone after. However, you are recommended to drive during the day not at night, no long distance and drive in familiar roads. - follow up in 6 months

## 2015-09-10 DIAGNOSIS — N2581 Secondary hyperparathyroidism of renal origin: Secondary | ICD-10-CM | POA: Diagnosis not present

## 2015-09-10 DIAGNOSIS — D509 Iron deficiency anemia, unspecified: Secondary | ICD-10-CM | POA: Diagnosis not present

## 2015-09-10 DIAGNOSIS — N186 End stage renal disease: Secondary | ICD-10-CM | POA: Diagnosis not present

## 2015-09-13 DIAGNOSIS — N2581 Secondary hyperparathyroidism of renal origin: Secondary | ICD-10-CM | POA: Diagnosis not present

## 2015-09-13 DIAGNOSIS — N186 End stage renal disease: Secondary | ICD-10-CM | POA: Diagnosis not present

## 2015-09-13 DIAGNOSIS — D509 Iron deficiency anemia, unspecified: Secondary | ICD-10-CM | POA: Diagnosis not present

## 2015-09-15 ENCOUNTER — Telehealth: Payer: Self-pay | Admitting: Internal Medicine

## 2015-09-15 DIAGNOSIS — N2581 Secondary hyperparathyroidism of renal origin: Secondary | ICD-10-CM | POA: Diagnosis not present

## 2015-09-15 DIAGNOSIS — D509 Iron deficiency anemia, unspecified: Secondary | ICD-10-CM | POA: Diagnosis not present

## 2015-09-15 DIAGNOSIS — N186 End stage renal disease: Secondary | ICD-10-CM | POA: Diagnosis not present

## 2015-09-15 NOTE — Telephone Encounter (Signed)
Surgery center will be faxing a form to Dr. Jenny Reichmann to be filled out to be taken off aspirin 3 days prior surgery. Pt does have an appt tomorrow with Dr. Jenny Reichmann. I put note in the appointment notes to be sure its addressed. Dahlia won't be here tomorrow so she suggested I send this to you.

## 2015-09-16 ENCOUNTER — Encounter: Payer: Self-pay | Admitting: Internal Medicine

## 2015-09-16 ENCOUNTER — Ambulatory Visit (INDEPENDENT_AMBULATORY_CARE_PROVIDER_SITE_OTHER): Payer: Medicare Other | Admitting: Internal Medicine

## 2015-09-16 VITALS — BP 120/62 | HR 68 | Temp 97.9°F | Resp 18 | Ht 69.0 in | Wt 149.0 lb

## 2015-09-16 DIAGNOSIS — I639 Cerebral infarction, unspecified: Secondary | ICD-10-CM | POA: Diagnosis not present

## 2015-09-16 DIAGNOSIS — I10 Essential (primary) hypertension: Secondary | ICD-10-CM

## 2015-09-16 DIAGNOSIS — I63311 Cerebral infarction due to thrombosis of right middle cerebral artery: Secondary | ICD-10-CM | POA: Diagnosis not present

## 2015-09-16 DIAGNOSIS — F32A Depression, unspecified: Secondary | ICD-10-CM

## 2015-09-16 DIAGNOSIS — F329 Major depressive disorder, single episode, unspecified: Secondary | ICD-10-CM

## 2015-09-16 NOTE — Progress Notes (Signed)
Pre visit review using our clinic review tool, if applicable. No additional management support is needed unless otherwise documented below in the visit note. 

## 2015-09-16 NOTE — Patient Instructions (Signed)
OK to HOLD the Aspirin 81 mg for 2 -3 days prior to left hand surgury  OK to take HALF the atenolol 50 mg (which makes it 25 mg) on DIALYSIS days only (Mon, wed, fri)  Please continue all other medications as before, and refills have been done if requested.  Please have the pharmacy call with any other refills you may need.  Please continue your efforts at being more active, low cholesterol diet, and weight control.  Please keep your appointments with your specialists as you may have planned

## 2015-09-16 NOTE — Progress Notes (Signed)
Subjective:    Patient ID: [name], male    DOB: 1929/07/17, 81 y.o.   MRN: [id number]  HPI  Here to f/u, c/o low BP on dialysis days, often does not take the atenolol b/c gets weak and dizzy, with lower BP. Pt denies chest pain, increased sob or doe, wheezing, orthopnea, PND, increased LE swelling, palpitations, syncope.  Pt denies new neurological symptoms such as new headache, or facial or extremity weakness or numbness   Pt denies polydipsia, polyuria, or low sugar symptoms such as weakness or confusion improved with po intake.  Pt states overall good compliance with meds, trying to follow lower cholesterol, diabetic diet, wt overall stable but little exercise however.  Due for catarat surgury soon;  Need to be off aspirin 3 days prior. Denies worsening depressive symptoms, suicidal ideation, or panic; Past Medical History  Diagnosis Date  . ANEMIA-IRON DEFICIENCY 03/09/2008  . DIVERTICULOSIS, COLON 03/09/2008  . GOUT 03/09/2008  . HYPERLIPIDEMIA 03/09/2008  . HYPERTENSION 03/09/2008  . PROSTATE CANCER, HX OF 03/09/2008  . ESRD (end stage renal disease) ([place]) 03/09/2008  . Complex renal cyst 06/19/2011  . Hyperparathyroidism ([place])   . Prostate cancer [facility]) 2002    Completed external beam radiation 2003.per HPI  . Arthritis     cervical spine.   Marland Kitchen History of blood transfusion   . Depression 03/26/2014  . Hemorrhoids 08/2009    internal.   . Radiation cystitis 2010.  Marland Kitchen Stroke [facility])    Past Surgical History  Procedure Laterality Date  . Tonsillectomy    . Rotator cuff repair right  4/08  . S/p left hip replacement  2007    Dr. Percell Miller ortho  . Joint replacement      HIP  . Av fistula placement Left 01/24/2013    Procedure: INSERTION OF ARTERIOVENOUS (AV) GORE-TEX GRAFT ARM;  Surgeon: Rosetta Posner, MD;  Location: [place];  Service: Vascular;  Laterality: Left;  . Flexible sigmoidoscopy N/A 06/15/2015    Procedure: FLEXIBLE SIGMOIDOSCOPY;  Surgeon: Gatha Mayer, MD;  Location: [place];   Service: Endoscopy;  Laterality: N/A;  . Colonoscopy N/A 06/17/2015    Procedure: COLONOSCOPY;  Surgeon: Gatha Mayer, MD;  Location: [place];  Service: Endoscopy;  Laterality: N/A;  . Esophagogastroduodenoscopy N/A 06/17/2015    Procedure: ESOPHAGOGASTRODUODENOSCOPY (EGD);  Surgeon: Gatha Mayer, MD;  Location: [facility] ENDOSCOPY;  Service: Endoscopy;  Laterality: N/A;  . Givens capsule study N/A 07/06/2015    Procedure: GIVENS CAPSULE STUDY;  Surgeon: Milus Banister, MD;  Location: [place];  Service: Endoscopy;  Laterality: N/A;    reports that he quit smoking about 33 years ago. His smoking use included Cigarettes. He quit after 5 years of use. He has never used smokeless tobacco. He reports that he does not drink alcohol or use illicit drugs. family history includes Cancer in his brother and father; Diabetes in his father; Hypertension in his brother and father. No Known Allergies Current Outpatient Prescriptions on File Prior to Visit  Medication Sig Dispense Refill  . allopurinol (ZYLOPRIM) 100 MG tablet Take 1 tablet (100 mg total) by mouth daily. 90 tablet 3  . aspirin 81 MG tablet Take 1 tablet (81 mg total) by mouth daily.    . citalopram (CELEXA) 10 MG tablet Take 1 tablet (10 mg total) by mouth daily. 10 tablet 0  . docusate sodium (COLACE) 100 MG capsule Take 2 capsules (200 mg total) by mouth 2 (two) times daily. [place]  capsule 0  . doxercalciferol (HECTOROL) 0.5 MCG capsule Take 1.5 mcg by mouth 3 (three) times a week. MWF    . Nutritional Supplements (FEEDING SUPPLEMENT, NEPRO CARB STEADY,) LIQD Take 237 mLs by mouth as needed (missed meal during dialysis.). 30 Can 0  . rosuvastatin (CRESTOR) 20 MG tablet Take 1 tablet (20 mg total) by mouth at bedtime. 90 tablet 3  . Specialty Vitamins Products (ONE-A-DAY ENERGY FORMULA PO) Take 1 capsule by mouth daily.     No current facility-administered medications on file prior to visit.   Review of Systems  Constitutional: Negative  for unusual diaphoresis or night sweats HENT: Negative for ringing in ear or discharge Eyes: Negative for double vision or worsening visual disturbance.  Respiratory: Negative for choking and stridor.   Gastrointestinal: Negative for vomiting or other signifcant bowel change Genitourinary: Negative for hematuria or change in urine volume.  Musculoskeletal: Negative for other MSK pain or swelling Skin: Negative for color change and worsening wound.  Neurological: Negative for tremors and numbness other than noted  Psychiatric/Behavioral: Negative for decreased concentration or agitation other than above       Objective:   Physical Exam BP 120/62 mmHg  Pulse 68  Temp(Src) 97.9 F (36.6 C) (Oral)  Resp 18  Ht 5\' 9"  (1.753 m)  Wt 149 lb (67.586 kg)  BMI 21.99 kg/m2 VS noted,  Constitutional: Pt appears in no significant distress HENT: Head: NCAT.  Right Ear: External ear normal.  Left Ear: External ear normal.  Eyes: . Pupils are equal, round, and reactive to light. Conjunctivae and EOM are normal Neck: Normal range of motion. Neck supple.  Cardiovascular: Normal rate and regular rhythm.   Pulmonary/Chest: Effort normal and breath sounds without rales or wheezing.  Abd:  Soft, NT, ND, + BS Neurological: Pt is alert. Not confused , motor grossly intact Skin: Skin is warm. No rash, no LE edema Psychiatric: Pt behavior is normal. No agitation. not depressed affect    Assessment & Plan:

## 2015-09-17 DIAGNOSIS — D509 Iron deficiency anemia, unspecified: Secondary | ICD-10-CM | POA: Diagnosis not present

## 2015-09-17 DIAGNOSIS — N2581 Secondary hyperparathyroidism of renal origin: Secondary | ICD-10-CM | POA: Diagnosis not present

## 2015-09-17 DIAGNOSIS — N186 End stage renal disease: Secondary | ICD-10-CM | POA: Diagnosis not present

## 2015-09-18 NOTE — Assessment & Plan Note (Signed)
stable overall by history and exam, recent data reviewed with pt, and pt to continue medical treatment as before,  to f/u any worsening symptoms or concerns Lab Results  Component Value Date   WBC 6.7 07/15/2015   HGB 10.0* 07/15/2015   HCT 30.4* 07/15/2015   PLT 197.0 07/15/2015   GLUCOSE 98 07/15/2015   CHOL 188 07/05/2015   TRIG 123 07/05/2015   HDL 65 07/05/2015   LDLCALC 98 07/05/2015   ALT 12* 07/05/2015   AST 22 07/05/2015   NA 143 07/15/2015   K 4.7 07/15/2015   CL 100 07/15/2015   CREATININE 6.45* 07/15/2015   BUN 23 07/15/2015   CO2 36* 07/15/2015   TSH 0.551 07/05/2015   INR 1.23 07/05/2015   HGBA1C 4.9 07/05/2015

## 2015-09-18 NOTE — Assessment & Plan Note (Signed)
stable overall by history and exam, recent data reviewed with pt, and pt to continue medical treatment as before,  to f/u any worsening symptoms or concerns BP Readings from Last 3 Encounters:  09/16/15 120/62  09/09/15 98/50  08/10/15 133/62

## 2015-09-18 NOTE — Assessment & Plan Note (Signed)
[place] for off asa x 3 days prior to cataract surgury, to restart next day,  to f/u any worsening symptoms or concerns

## 2015-09-20 DIAGNOSIS — D509 Iron deficiency anemia, unspecified: Secondary | ICD-10-CM | POA: Diagnosis not present

## 2015-09-20 DIAGNOSIS — N186 End stage renal disease: Secondary | ICD-10-CM | POA: Diagnosis not present

## 2015-09-20 DIAGNOSIS — N2581 Secondary hyperparathyroidism of renal origin: Secondary | ICD-10-CM | POA: Diagnosis not present

## 2015-09-21 ENCOUNTER — Ambulatory Visit (HOSPITAL_BASED_OUTPATIENT_CLINIC_OR_DEPARTMENT_OTHER): Admission: RE | Admit: 2015-09-21 | Payer: Medicare Other | Source: Ambulatory Visit | Admitting: Orthopedic Surgery

## 2015-09-21 ENCOUNTER — Ambulatory Visit: Payer: Medicare Other | Admitting: Internal Medicine

## 2015-09-21 ENCOUNTER — Encounter (HOSPITAL_BASED_OUTPATIENT_CLINIC_OR_DEPARTMENT_OTHER): Admission: RE | Payer: Self-pay | Source: Ambulatory Visit

## 2015-09-21 DIAGNOSIS — C61 Malignant neoplasm of prostate: Secondary | ICD-10-CM | POA: Diagnosis not present

## 2015-09-21 DIAGNOSIS — D2112 Benign neoplasm of connective and other soft tissue of left upper limb, including shoulder: Secondary | ICD-10-CM | POA: Diagnosis not present

## 2015-09-21 DIAGNOSIS — M7989 Other specified soft tissue disorders: Secondary | ICD-10-CM | POA: Diagnosis not present

## 2015-09-21 SURGERY — EXCISION METACARPAL MASS
Anesthesia: Choice | Site: Hand | Laterality: Left

## 2015-09-22 DIAGNOSIS — N2581 Secondary hyperparathyroidism of renal origin: Secondary | ICD-10-CM | POA: Diagnosis not present

## 2015-09-22 DIAGNOSIS — D509 Iron deficiency anemia, unspecified: Secondary | ICD-10-CM | POA: Diagnosis not present

## 2015-09-22 DIAGNOSIS — N186 End stage renal disease: Secondary | ICD-10-CM | POA: Diagnosis not present

## 2015-09-24 DIAGNOSIS — D509 Iron deficiency anemia, unspecified: Secondary | ICD-10-CM | POA: Diagnosis not present

## 2015-09-24 DIAGNOSIS — N186 End stage renal disease: Secondary | ICD-10-CM | POA: Diagnosis not present

## 2015-09-24 DIAGNOSIS — N2581 Secondary hyperparathyroidism of renal origin: Secondary | ICD-10-CM | POA: Diagnosis not present

## 2015-09-27 DIAGNOSIS — N186 End stage renal disease: Secondary | ICD-10-CM | POA: Diagnosis not present

## 2015-09-27 DIAGNOSIS — N2581 Secondary hyperparathyroidism of renal origin: Secondary | ICD-10-CM | POA: Diagnosis not present

## 2015-09-27 DIAGNOSIS — D509 Iron deficiency anemia, unspecified: Secondary | ICD-10-CM | POA: Diagnosis not present

## 2015-09-29 DIAGNOSIS — N186 End stage renal disease: Secondary | ICD-10-CM | POA: Diagnosis not present

## 2015-09-29 DIAGNOSIS — D509 Iron deficiency anemia, unspecified: Secondary | ICD-10-CM | POA: Diagnosis not present

## 2015-09-29 DIAGNOSIS — N2581 Secondary hyperparathyroidism of renal origin: Secondary | ICD-10-CM | POA: Diagnosis not present

## 2015-10-02 DIAGNOSIS — D509 Iron deficiency anemia, unspecified: Secondary | ICD-10-CM | POA: Diagnosis not present

## 2015-10-02 DIAGNOSIS — N2581 Secondary hyperparathyroidism of renal origin: Secondary | ICD-10-CM | POA: Diagnosis not present

## 2015-10-02 DIAGNOSIS — N186 End stage renal disease: Secondary | ICD-10-CM | POA: Diagnosis not present

## 2015-10-04 DIAGNOSIS — N186 End stage renal disease: Secondary | ICD-10-CM | POA: Diagnosis not present

## 2015-10-04 DIAGNOSIS — C61 Malignant neoplasm of prostate: Secondary | ICD-10-CM | POA: Diagnosis not present

## 2015-10-04 DIAGNOSIS — R3915 Urgency of urination: Secondary | ICD-10-CM | POA: Diagnosis not present

## 2015-10-04 DIAGNOSIS — D509 Iron deficiency anemia, unspecified: Secondary | ICD-10-CM | POA: Diagnosis not present

## 2015-10-04 DIAGNOSIS — N2581 Secondary hyperparathyroidism of renal origin: Secondary | ICD-10-CM | POA: Diagnosis not present

## 2015-10-06 DIAGNOSIS — N186 End stage renal disease: Secondary | ICD-10-CM | POA: Diagnosis not present

## 2015-10-06 DIAGNOSIS — N2581 Secondary hyperparathyroidism of renal origin: Secondary | ICD-10-CM | POA: Diagnosis not present

## 2015-10-06 DIAGNOSIS — I158 Other secondary hypertension: Secondary | ICD-10-CM | POA: Diagnosis not present

## 2015-10-06 DIAGNOSIS — Z992 Dependence on renal dialysis: Secondary | ICD-10-CM | POA: Diagnosis not present

## 2015-10-06 DIAGNOSIS — D509 Iron deficiency anemia, unspecified: Secondary | ICD-10-CM | POA: Diagnosis not present

## 2015-10-08 DIAGNOSIS — N2581 Secondary hyperparathyroidism of renal origin: Secondary | ICD-10-CM | POA: Diagnosis not present

## 2015-10-08 DIAGNOSIS — D631 Anemia in chronic kidney disease: Secondary | ICD-10-CM | POA: Diagnosis not present

## 2015-10-08 DIAGNOSIS — D509 Iron deficiency anemia, unspecified: Secondary | ICD-10-CM | POA: Diagnosis not present

## 2015-10-08 DIAGNOSIS — N186 End stage renal disease: Secondary | ICD-10-CM | POA: Diagnosis not present

## 2015-10-11 DIAGNOSIS — D509 Iron deficiency anemia, unspecified: Secondary | ICD-10-CM | POA: Diagnosis not present

## 2015-10-11 DIAGNOSIS — D631 Anemia in chronic kidney disease: Secondary | ICD-10-CM | POA: Diagnosis not present

## 2015-10-11 DIAGNOSIS — R2232 Localized swelling, mass and lump, left upper limb: Secondary | ICD-10-CM | POA: Insufficient documentation

## 2015-10-11 DIAGNOSIS — N186 End stage renal disease: Secondary | ICD-10-CM | POA: Diagnosis not present

## 2015-10-11 DIAGNOSIS — N2581 Secondary hyperparathyroidism of renal origin: Secondary | ICD-10-CM | POA: Diagnosis not present

## 2015-10-13 DIAGNOSIS — D631 Anemia in chronic kidney disease: Secondary | ICD-10-CM | POA: Diagnosis not present

## 2015-10-13 DIAGNOSIS — N186 End stage renal disease: Secondary | ICD-10-CM | POA: Diagnosis not present

## 2015-10-13 DIAGNOSIS — D509 Iron deficiency anemia, unspecified: Secondary | ICD-10-CM | POA: Diagnosis not present

## 2015-10-13 DIAGNOSIS — N2581 Secondary hyperparathyroidism of renal origin: Secondary | ICD-10-CM | POA: Diagnosis not present

## 2015-10-15 DIAGNOSIS — D509 Iron deficiency anemia, unspecified: Secondary | ICD-10-CM | POA: Diagnosis not present

## 2015-10-15 DIAGNOSIS — N186 End stage renal disease: Secondary | ICD-10-CM | POA: Diagnosis not present

## 2015-10-15 DIAGNOSIS — D631 Anemia in chronic kidney disease: Secondary | ICD-10-CM | POA: Diagnosis not present

## 2015-10-15 DIAGNOSIS — N2581 Secondary hyperparathyroidism of renal origin: Secondary | ICD-10-CM | POA: Diagnosis not present

## 2015-10-18 DIAGNOSIS — N186 End stage renal disease: Secondary | ICD-10-CM | POA: Diagnosis not present

## 2015-10-18 DIAGNOSIS — D509 Iron deficiency anemia, unspecified: Secondary | ICD-10-CM | POA: Diagnosis not present

## 2015-10-18 DIAGNOSIS — N2581 Secondary hyperparathyroidism of renal origin: Secondary | ICD-10-CM | POA: Diagnosis not present

## 2015-10-18 DIAGNOSIS — D631 Anemia in chronic kidney disease: Secondary | ICD-10-CM | POA: Diagnosis not present

## 2015-10-20 DIAGNOSIS — N2581 Secondary hyperparathyroidism of renal origin: Secondary | ICD-10-CM | POA: Diagnosis not present

## 2015-10-20 DIAGNOSIS — D509 Iron deficiency anemia, unspecified: Secondary | ICD-10-CM | POA: Diagnosis not present

## 2015-10-20 DIAGNOSIS — N186 End stage renal disease: Secondary | ICD-10-CM | POA: Diagnosis not present

## 2015-10-20 DIAGNOSIS — D631 Anemia in chronic kidney disease: Secondary | ICD-10-CM | POA: Diagnosis not present

## 2015-10-22 DIAGNOSIS — D631 Anemia in chronic kidney disease: Secondary | ICD-10-CM | POA: Diagnosis not present

## 2015-10-22 DIAGNOSIS — N2581 Secondary hyperparathyroidism of renal origin: Secondary | ICD-10-CM | POA: Diagnosis not present

## 2015-10-22 DIAGNOSIS — N186 End stage renal disease: Secondary | ICD-10-CM | POA: Diagnosis not present

## 2015-10-22 DIAGNOSIS — D509 Iron deficiency anemia, unspecified: Secondary | ICD-10-CM | POA: Diagnosis not present

## 2015-10-25 DIAGNOSIS — D509 Iron deficiency anemia, unspecified: Secondary | ICD-10-CM | POA: Diagnosis not present

## 2015-10-25 DIAGNOSIS — D631 Anemia in chronic kidney disease: Secondary | ICD-10-CM | POA: Diagnosis not present

## 2015-10-25 DIAGNOSIS — N2581 Secondary hyperparathyroidism of renal origin: Secondary | ICD-10-CM | POA: Diagnosis not present

## 2015-10-25 DIAGNOSIS — N186 End stage renal disease: Secondary | ICD-10-CM | POA: Diagnosis not present

## 2015-10-27 DIAGNOSIS — D509 Iron deficiency anemia, unspecified: Secondary | ICD-10-CM | POA: Diagnosis not present

## 2015-10-27 DIAGNOSIS — D631 Anemia in chronic kidney disease: Secondary | ICD-10-CM | POA: Diagnosis not present

## 2015-10-27 DIAGNOSIS — N2581 Secondary hyperparathyroidism of renal origin: Secondary | ICD-10-CM | POA: Diagnosis not present

## 2015-10-27 DIAGNOSIS — N186 End stage renal disease: Secondary | ICD-10-CM | POA: Diagnosis not present

## 2015-10-29 DIAGNOSIS — N186 End stage renal disease: Secondary | ICD-10-CM | POA: Diagnosis not present

## 2015-10-29 DIAGNOSIS — D631 Anemia in chronic kidney disease: Secondary | ICD-10-CM | POA: Diagnosis not present

## 2015-10-29 DIAGNOSIS — N2581 Secondary hyperparathyroidism of renal origin: Secondary | ICD-10-CM | POA: Diagnosis not present

## 2015-10-29 DIAGNOSIS — D509 Iron deficiency anemia, unspecified: Secondary | ICD-10-CM | POA: Diagnosis not present

## 2015-11-01 DIAGNOSIS — D509 Iron deficiency anemia, unspecified: Secondary | ICD-10-CM | POA: Diagnosis not present

## 2015-11-01 DIAGNOSIS — N2581 Secondary hyperparathyroidism of renal origin: Secondary | ICD-10-CM | POA: Diagnosis not present

## 2015-11-01 DIAGNOSIS — N186 End stage renal disease: Secondary | ICD-10-CM | POA: Diagnosis not present

## 2015-11-01 DIAGNOSIS — D631 Anemia in chronic kidney disease: Secondary | ICD-10-CM | POA: Diagnosis not present

## 2015-11-02 ENCOUNTER — Encounter: Payer: Self-pay | Admitting: Internal Medicine

## 2015-11-03 DIAGNOSIS — D509 Iron deficiency anemia, unspecified: Secondary | ICD-10-CM | POA: Diagnosis not present

## 2015-11-03 DIAGNOSIS — N2581 Secondary hyperparathyroidism of renal origin: Secondary | ICD-10-CM | POA: Diagnosis not present

## 2015-11-03 DIAGNOSIS — N186 End stage renal disease: Secondary | ICD-10-CM | POA: Diagnosis not present

## 2015-11-03 DIAGNOSIS — D631 Anemia in chronic kidney disease: Secondary | ICD-10-CM | POA: Diagnosis not present

## 2015-11-05 DIAGNOSIS — D509 Iron deficiency anemia, unspecified: Secondary | ICD-10-CM | POA: Diagnosis not present

## 2015-11-05 DIAGNOSIS — D631 Anemia in chronic kidney disease: Secondary | ICD-10-CM | POA: Diagnosis not present

## 2015-11-05 DIAGNOSIS — N186 End stage renal disease: Secondary | ICD-10-CM | POA: Diagnosis not present

## 2015-11-05 DIAGNOSIS — N2581 Secondary hyperparathyroidism of renal origin: Secondary | ICD-10-CM | POA: Diagnosis not present

## 2015-11-06 DIAGNOSIS — I158 Other secondary hypertension: Secondary | ICD-10-CM | POA: Diagnosis not present

## 2015-11-06 DIAGNOSIS — N186 End stage renal disease: Secondary | ICD-10-CM | POA: Diagnosis not present

## 2015-11-06 DIAGNOSIS — Z992 Dependence on renal dialysis: Secondary | ICD-10-CM | POA: Diagnosis not present

## 2015-11-08 DIAGNOSIS — D631 Anemia in chronic kidney disease: Secondary | ICD-10-CM | POA: Diagnosis not present

## 2015-11-08 DIAGNOSIS — N186 End stage renal disease: Secondary | ICD-10-CM | POA: Diagnosis not present

## 2015-11-08 DIAGNOSIS — N2581 Secondary hyperparathyroidism of renal origin: Secondary | ICD-10-CM | POA: Diagnosis not present

## 2015-11-10 DIAGNOSIS — N2581 Secondary hyperparathyroidism of renal origin: Secondary | ICD-10-CM | POA: Diagnosis not present

## 2015-11-10 DIAGNOSIS — D631 Anemia in chronic kidney disease: Secondary | ICD-10-CM | POA: Diagnosis not present

## 2015-11-10 DIAGNOSIS — N186 End stage renal disease: Secondary | ICD-10-CM | POA: Diagnosis not present

## 2015-11-12 DIAGNOSIS — N2581 Secondary hyperparathyroidism of renal origin: Secondary | ICD-10-CM | POA: Diagnosis not present

## 2015-11-12 DIAGNOSIS — N186 End stage renal disease: Secondary | ICD-10-CM | POA: Diagnosis not present

## 2015-11-12 DIAGNOSIS — D631 Anemia in chronic kidney disease: Secondary | ICD-10-CM | POA: Diagnosis not present

## 2015-11-15 DIAGNOSIS — N186 End stage renal disease: Secondary | ICD-10-CM | POA: Diagnosis not present

## 2015-11-15 DIAGNOSIS — N2581 Secondary hyperparathyroidism of renal origin: Secondary | ICD-10-CM | POA: Diagnosis not present

## 2015-11-15 DIAGNOSIS — D631 Anemia in chronic kidney disease: Secondary | ICD-10-CM | POA: Diagnosis not present

## 2015-11-17 DIAGNOSIS — N186 End stage renal disease: Secondary | ICD-10-CM | POA: Diagnosis not present

## 2015-11-17 DIAGNOSIS — D631 Anemia in chronic kidney disease: Secondary | ICD-10-CM | POA: Diagnosis not present

## 2015-11-17 DIAGNOSIS — N2581 Secondary hyperparathyroidism of renal origin: Secondary | ICD-10-CM | POA: Diagnosis not present

## 2015-11-19 DIAGNOSIS — D631 Anemia in chronic kidney disease: Secondary | ICD-10-CM | POA: Diagnosis not present

## 2015-11-19 DIAGNOSIS — N2581 Secondary hyperparathyroidism of renal origin: Secondary | ICD-10-CM | POA: Diagnosis not present

## 2015-11-19 DIAGNOSIS — N186 End stage renal disease: Secondary | ICD-10-CM | POA: Diagnosis not present

## 2015-11-22 DIAGNOSIS — D631 Anemia in chronic kidney disease: Secondary | ICD-10-CM | POA: Diagnosis not present

## 2015-11-22 DIAGNOSIS — N186 End stage renal disease: Secondary | ICD-10-CM | POA: Diagnosis not present

## 2015-11-22 DIAGNOSIS — N2581 Secondary hyperparathyroidism of renal origin: Secondary | ICD-10-CM | POA: Diagnosis not present

## 2015-11-24 DIAGNOSIS — N186 End stage renal disease: Secondary | ICD-10-CM | POA: Diagnosis not present

## 2015-11-24 DIAGNOSIS — N2581 Secondary hyperparathyroidism of renal origin: Secondary | ICD-10-CM | POA: Diagnosis not present

## 2015-11-24 DIAGNOSIS — D631 Anemia in chronic kidney disease: Secondary | ICD-10-CM | POA: Diagnosis not present

## 2015-11-26 DIAGNOSIS — D631 Anemia in chronic kidney disease: Secondary | ICD-10-CM | POA: Diagnosis not present

## 2015-11-26 DIAGNOSIS — N2581 Secondary hyperparathyroidism of renal origin: Secondary | ICD-10-CM | POA: Diagnosis not present

## 2015-11-26 DIAGNOSIS — N186 End stage renal disease: Secondary | ICD-10-CM | POA: Diagnosis not present

## 2015-11-29 DIAGNOSIS — N186 End stage renal disease: Secondary | ICD-10-CM | POA: Diagnosis not present

## 2015-11-29 DIAGNOSIS — N2581 Secondary hyperparathyroidism of renal origin: Secondary | ICD-10-CM | POA: Diagnosis not present

## 2015-11-29 DIAGNOSIS — D631 Anemia in chronic kidney disease: Secondary | ICD-10-CM | POA: Diagnosis not present

## 2015-12-01 DIAGNOSIS — N2581 Secondary hyperparathyroidism of renal origin: Secondary | ICD-10-CM | POA: Diagnosis not present

## 2015-12-01 DIAGNOSIS — D631 Anemia in chronic kidney disease: Secondary | ICD-10-CM | POA: Diagnosis not present

## 2015-12-01 DIAGNOSIS — N186 End stage renal disease: Secondary | ICD-10-CM | POA: Diagnosis not present

## 2015-12-03 DIAGNOSIS — N186 End stage renal disease: Secondary | ICD-10-CM | POA: Diagnosis not present

## 2015-12-03 DIAGNOSIS — N2581 Secondary hyperparathyroidism of renal origin: Secondary | ICD-10-CM | POA: Diagnosis not present

## 2015-12-03 DIAGNOSIS — D631 Anemia in chronic kidney disease: Secondary | ICD-10-CM | POA: Diagnosis not present

## 2015-12-06 ENCOUNTER — Emergency Department (HOSPITAL_COMMUNITY): Payer: Medicare Other

## 2015-12-06 ENCOUNTER — Inpatient Hospital Stay (HOSPITAL_COMMUNITY)
Admission: EM | Admit: 2015-12-06 | Discharge: 2015-12-09 | DRG: 871 | Disposition: A | Discharge status: Home / Self Care | Payer: Medicare Other | Attending: Family Medicine | Admitting: Family Medicine

## 2015-12-06 ENCOUNTER — Encounter (HOSPITAL_COMMUNITY): Payer: Self-pay | Admitting: *Deleted

## 2015-12-06 DIAGNOSIS — G9340 Encephalopathy, unspecified: Secondary | ICD-10-CM | POA: Diagnosis present

## 2015-12-06 DIAGNOSIS — R4182 Altered mental status, unspecified: Secondary | ICD-10-CM | POA: Diagnosis not present

## 2015-12-06 DIAGNOSIS — Z8673 Personal history of transient ischemic attack (TIA), and cerebral infarction without residual deficits: Secondary | ICD-10-CM

## 2015-12-06 DIAGNOSIS — D696 Thrombocytopenia, unspecified: Secondary | ICD-10-CM | POA: Diagnosis present

## 2015-12-06 DIAGNOSIS — E785 Hyperlipidemia, unspecified: Secondary | ICD-10-CM | POA: Diagnosis present

## 2015-12-06 DIAGNOSIS — J189 Pneumonia, unspecified organism: Secondary | ICD-10-CM | POA: Diagnosis present

## 2015-12-06 DIAGNOSIS — Z8719 Personal history of other diseases of the digestive system: Secondary | ICD-10-CM | POA: Diagnosis not present

## 2015-12-06 DIAGNOSIS — Z8546 Personal history of malignant neoplasm of prostate: Secondary | ICD-10-CM

## 2015-12-06 DIAGNOSIS — I120 Hypertensive chronic kidney disease with stage 5 chronic kidney disease or end stage renal disease: Secondary | ICD-10-CM | POA: Diagnosis present

## 2015-12-06 DIAGNOSIS — I248 Other forms of acute ischemic heart disease: Secondary | ICD-10-CM | POA: Diagnosis present

## 2015-12-06 DIAGNOSIS — Z992 Dependence on renal dialysis: Secondary | ICD-10-CM

## 2015-12-06 DIAGNOSIS — A419 Sepsis, unspecified organism: Principal | ICD-10-CM | POA: Diagnosis present

## 2015-12-06 DIAGNOSIS — I361 Nonrheumatic tricuspid (valve) insufficiency: Secondary | ICD-10-CM | POA: Diagnosis not present

## 2015-12-06 DIAGNOSIS — Z923 Personal history of irradiation: Secondary | ICD-10-CM

## 2015-12-06 DIAGNOSIS — E8889 Other specified metabolic disorders: Secondary | ICD-10-CM | POA: Diagnosis present

## 2015-12-06 DIAGNOSIS — Z87891 Personal history of nicotine dependence: Secondary | ICD-10-CM

## 2015-12-06 DIAGNOSIS — N186 End stage renal disease: Secondary | ICD-10-CM

## 2015-12-06 DIAGNOSIS — I10 Essential (primary) hypertension: Secondary | ICD-10-CM | POA: Diagnosis present

## 2015-12-06 DIAGNOSIS — M109 Gout, unspecified: Secondary | ICD-10-CM | POA: Diagnosis present

## 2015-12-06 DIAGNOSIS — N2581 Secondary hyperparathyroidism of renal origin: Secondary | ICD-10-CM | POA: Diagnosis present

## 2015-12-06 DIAGNOSIS — Z7982 Long term (current) use of aspirin: Secondary | ICD-10-CM | POA: Diagnosis not present

## 2015-12-06 DIAGNOSIS — I371 Nonrheumatic pulmonary valve insufficiency: Secondary | ICD-10-CM | POA: Diagnosis not present

## 2015-12-06 DIAGNOSIS — Z96642 Presence of left artificial hip joint: Secondary | ICD-10-CM | POA: Diagnosis present

## 2015-12-06 DIAGNOSIS — D631 Anemia in chronic kidney disease: Secondary | ICD-10-CM | POA: Diagnosis present

## 2015-12-06 DIAGNOSIS — R509 Fever, unspecified: Secondary | ICD-10-CM

## 2015-12-06 DIAGNOSIS — I158 Other secondary hypertension: Secondary | ICD-10-CM | POA: Diagnosis not present

## 2015-12-06 LAB — CBC WITH DIFFERENTIAL/PLATELET
Basophils Absolute: 0 10*3/uL (ref 0.0–0.1)
Basophils Relative: 0 %
EOS ABS: 0 10*3/uL (ref 0.0–0.7)
Eosinophils Relative: 0 %
HCT: 38.1 % — ABNORMAL LOW (ref 39.0–52.0)
HEMOGLOBIN: 12.2 g/dL — AB (ref 13.0–17.0)
LYMPHS ABS: 0.8 10*3/uL (ref 0.7–4.0)
Lymphocytes Relative: 9 %
MCH: 29.8 pg (ref 26.0–34.0)
MCHC: 32 g/dL (ref 30.0–36.0)
MCV: 92.9 fL (ref 78.0–100.0)
Monocytes Absolute: 0.6 10*3/uL (ref 0.1–1.0)
Monocytes Relative: 7 %
Neutro Abs: 7.8 10*3/uL — ABNORMAL HIGH (ref 1.7–7.7)
Neutrophils Relative %: 84 %
Platelets: 122 10*3/uL — ABNORMAL LOW (ref 150–400)
RBC: 4.1 MIL/uL — AB (ref 4.22–5.81)
RDW: 19.5 % — ABNORMAL HIGH (ref 11.5–15.5)
WBC: 9.3 10*3/uL (ref 4.0–10.5)

## 2015-12-06 LAB — COMPREHENSIVE METABOLIC PANEL
ALT: 14 U/L — AB (ref 17–63)
AST: 26 U/L (ref 15–41)
Albumin: 3.7 g/dL (ref 3.5–5.0)
Alkaline Phosphatase: 53 U/L (ref 38–126)
Anion gap: 18 — ABNORMAL HIGH (ref 5–15)
BUN: 22 mg/dL — AB (ref 6–20)
CHLORIDE: 94 mmol/L — AB (ref 101–111)
CO2: 27 mmol/L (ref 22–32)
CREATININE: 5.93 mg/dL — AB (ref 0.61–1.24)
Calcium: 8.9 mg/dL (ref 8.9–10.3)
GFR calc Af Amer: 9 mL/min — ABNORMAL LOW (ref >60)
GFR, EST NON AFRICAN AMERICAN: 8 mL/min — AB (ref >60)
GLUCOSE: 128 mg/dL — AB (ref 65–99)
Potassium: 4.3 mmol/L (ref 3.5–5.1)
SODIUM: 139 mmol/L (ref 135–145)
Total Bilirubin: 0.8 mg/dL (ref 0.3–1.2)
Total Protein: 6.7 g/dL (ref 6.5–8.1)

## 2015-12-06 LAB — TROPONIN I: TROPONIN I: 0.11 ng/mL — AB (ref <0.031)

## 2015-12-06 LAB — PROTIME-INR
INR: 1.36 (ref 0.00–1.49)
Prothrombin Time: 16.9 seconds — ABNORMAL HIGH (ref 11.6–15.2)

## 2015-12-06 LAB — I-STAT TROPONIN, ED: Troponin i, poc: 0.17 ng/mL (ref 0.00–0.08)

## 2015-12-06 LAB — I-STAT CHEM 8, ED
BUN: 29 mg/dL — AB (ref 6–20)
CALCIUM ION: 0.94 mmol/L — AB (ref 1.13–1.30)
CHLORIDE: 94 mmol/L — AB (ref 101–111)
Creatinine, Ser: 5.7 mg/dL — ABNORMAL HIGH (ref 0.61–1.24)
Glucose, Bld: 125 mg/dL — ABNORMAL HIGH (ref 65–99)
HEMATOCRIT: 40 % (ref 39.0–52.0)
Hemoglobin: 13.6 g/dL (ref 13.0–17.0)
Potassium: 4 mmol/L (ref 3.5–5.1)
SODIUM: 136 mmol/L (ref 135–145)
TCO2: 34 mmol/L (ref 0–100)

## 2015-12-06 LAB — I-STAT CG4 LACTIC ACID, ED: Lactic Acid, Venous: 2.59 mmol/L (ref 0.5–2.0)

## 2015-12-06 LAB — LACTIC ACID, PLASMA: Lactic Acid, Venous: 1.7 mmol/L (ref 0.5–2.0)

## 2015-12-06 LAB — PROCALCITONIN: Procalcitonin: 2.57 ng/mL

## 2015-12-06 LAB — APTT: APTT: 37 s (ref 24–37)

## 2015-12-06 MED ORDER — ACETAMINOPHEN 325 MG PO TABS
650.0000 mg | ORAL_TABLET | Freq: Once | ORAL | Status: AC
  Administered 2015-12-06: 650 mg via ORAL
  Filled 2015-12-06: qty 2

## 2015-12-06 MED ORDER — CITALOPRAM HYDROBROMIDE 20 MG PO TABS: 10.0000 mg | ORAL_TABLET | Freq: Every day | ORAL | Status: DC

## 2015-12-06 MED ORDER — SODIUM CHLORIDE 0.9% FLUSH
3.0000 mL | Freq: Two times a day (BID) | INTRAVENOUS | Status: DC
  Administered 2015-12-06 – 2015-12-09 (×6): 3 mL via INTRAVENOUS

## 2015-12-06 MED ORDER — ASPIRIN EC 81 MG PO TBEC
81.0000 mg | DELAYED_RELEASE_TABLET | Freq: Every day | ORAL | Status: DC
  Administered 2015-12-07 – 2015-12-09 (×3): 81 mg via ORAL
  Filled 2015-12-06 (×3): qty 1

## 2015-12-06 MED ORDER — VANCOMYCIN HCL 10 G IV SOLR
1500.0000 mg | Freq: Once | INTRAVENOUS | Status: AC
  Administered 2015-12-06: 1500 mg via INTRAVENOUS
  Filled 2015-12-06: qty 1500

## 2015-12-06 MED ORDER — ALLOPURINOL 100 MG PO TABS: 100.0000 mg | ORAL_TABLET | Freq: Every day | ORAL | Status: DC

## 2015-12-06 MED ORDER — ROSUVASTATIN CALCIUM 20 MG PO TABS
20.0000 mg | ORAL_TABLET | Freq: Every day | ORAL | Status: DC
  Administered 2015-12-07 – 2015-12-08 (×2): 20 mg via ORAL
  Filled 2015-12-06 (×2): qty 1

## 2015-12-06 MED ORDER — PIPERACILLIN-TAZOBACTAM IN DEX 2-0.25 GM/50ML IV SOLN
2.2500 g | Freq: Three times a day (TID) | INTRAVENOUS | Status: DC
  Administered 2015-12-07 – 2015-12-09 (×8): 2.25 g via INTRAVENOUS
  Filled 2015-12-06 (×13): qty 50

## 2015-12-06 MED ORDER — PIPERACILLIN-TAZOBACTAM 3.375 G IVPB 30 MIN
3.3750 g | Freq: Once | INTRAVENOUS | Status: AC
  Administered 2015-12-06: 3.375 g via INTRAVENOUS
  Filled 2015-12-06: qty 50

## 2015-12-06 MED ORDER — DOXERCALCIFEROL 0.5 MCG PO CAPS
1.5000 ug | ORAL_CAPSULE | ORAL | Status: DC
  Filled 2015-12-06: qty 3

## 2015-12-06 MED ORDER — HEPARIN SODIUM (PORCINE) 5000 UNIT/ML IJ SOLN
5000.0000 [IU] | Freq: Three times a day (TID) | INTRAMUSCULAR | Status: DC
  Administered 2015-12-06 – 2015-12-09 (×9): 5000 [IU] via SUBCUTANEOUS
  Filled 2015-12-06 (×5): qty 1

## 2015-12-06 MED ORDER — SODIUM CHLORIDE 0.9 % IV BOLUS (SEPSIS)
1000.0000 mL | Freq: Once | INTRAVENOUS | Status: AC
  Administered 2015-12-06: 1000 mL via INTRAVENOUS

## 2015-12-06 MED ORDER — ALLOPURINOL 100 MG PO TABS
100.0000 mg | ORAL_TABLET | Freq: Every day | ORAL | Status: DC
  Administered 2015-12-07 – 2015-12-09 (×3): 100 mg via ORAL
  Filled 2015-12-06 (×3): qty 1

## 2015-12-06 MED ORDER — CITALOPRAM HYDROBROMIDE 20 MG PO TABS
10.0000 mg | ORAL_TABLET | Freq: Every day | ORAL | Status: DC
  Administered 2015-12-06 – 2015-12-09 (×4): 10 mg via ORAL
  Filled 2015-12-06 (×4): qty 1

## 2015-12-06 MED ORDER — ATENOLOL 50 MG PO TABS
50.0000 mg | ORAL_TABLET | Freq: Every day | ORAL | Status: DC
  Administered 2015-12-07: 50 mg via ORAL
  Filled 2015-12-06: qty 1

## 2015-12-06 MED ORDER — DOCUSATE SODIUM 100 MG PO CAPS
200.0000 mg | ORAL_CAPSULE | Freq: Two times a day (BID) | ORAL | Status: DC
  Administered 2015-12-07 – 2015-12-09 (×5): 200 mg via ORAL
  Filled 2015-12-06 (×5): qty 2

## 2015-12-06 MED ORDER — VANCOMYCIN HCL IN DEXTROSE 750-5 MG/150ML-% IV SOLN
750.0000 mg | INTRAVENOUS | Status: DC
  Administered 2015-12-08: 750 mg via INTRAVENOUS
  Filled 2015-12-06: qty 150

## 2015-12-06 MED ORDER — ATENOLOL 50 MG PO TABS: 50.0000 mg | ORAL_TABLET | Freq: Every day | ORAL | Status: DC

## 2015-12-06 MED ORDER — ASPIRIN 81 MG PO CHEW
324.0000 mg | CHEWABLE_TABLET | Freq: Once | ORAL | Status: AC
  Administered 2015-12-06: 324 mg via ORAL
  Filled 2015-12-06: qty 4

## 2015-12-06 NOTE — Progress Notes (Signed)
ANTIBIOTIC CONSULT NOTE - INITIAL  Pharmacy Consult for Vancomycin and Zosyn Indication: pneumonia  No Known Allergies  Patient Measurements: Height: 5\' 7"  (170.2 cm) Weight: 149 lb (67.586 kg) IBW/kg (Calculated) : 66.1  Vital Signs: Temp: 102.5 F (39.2 C) (01/30 1825) Temp Source: Oral (01/30 1825) BP: 179/94 mmHg (01/30 1825) Pulse Rate: 94 (01/30 1825) Intake/Output from previous day:   Intake/Output from this shift:    Labs: No results for input(s): WBC, HGB, PLT, LABCREA, CREATININE in the last 72 hours. CrCl cannot be calculated (Patient has no serum creatinine result on file.). No results for input(s): VANCOTROUGH, VANCOPEAK, VANCORANDOM, GENTTROUGH, GENTPEAK, GENTRANDOM, TOBRATROUGH, TOBRAPEAK, TOBRARND, AMIKACINPEAK, AMIKACINTROU, AMIKACIN in the last 72 hours.   Microbiology: No results found for this or any previous visit (from the past 720 hour(s)).  Medical History: Past Medical History  Diagnosis Date  . ANEMIA-IRON DEFICIENCY 03/09/2008  . DIVERTICULOSIS, COLON 03/09/2008  . GOUT 03/09/2008  . HYPERLIPIDEMIA 03/09/2008  . HYPERTENSION 03/09/2008  . PROSTATE CANCER, HX OF 03/09/2008  . ESRD (end stage renal disease) ([place]) 03/09/2008  . Complex renal cyst 06/19/2011  . Hyperparathyroidism ([place])   . Prostate cancer [facility]) 2002    Completed external beam radiation 2003.per HPI  . Arthritis     cervical spine.   Marland Kitchen History of blood transfusion   . Depression 03/26/2014  . Hemorrhoids 08/2009    internal.   . Radiation cystitis 2010.  Marland Kitchen Stroke [facility])     Medications:   (Not in a hospital admission) Scheduled:  . acetaminophen  650 mg Oral Once   Infusions:  . sodium chloride 1,000 mL (12/06/15 1836)   Assessment: 82yo M w/ hx of ESRD on dialysis. Presents w/ AMS. Pharmacy consulted for Vanc and Zosyn for PNA. Temp 102.5 and cx pending.  HD schedule: MWF (last dialysis 1/30)   Goal of Therapy:  Vancomycin trough level 15-20 mcg/ml  Plan:  Vancomycin  1500mg  IV once, then 750mg  IV qHD Zosyn 3.375g IV EI, then 2.25g IV q8h Measure antibiotic drug levels at steady state Follow up culture results, abx tx, and clinical course  Georgiann Mohs, PharmD Student  12/06/2015,6:33 PM

## 2015-12-06 NOTE — ED Notes (Signed)
Pt's wife states he's been "different" since dialysis, but she can't put her finger on it.  Pt ao x 4.  No slurred speech or focal deficits.  Temp 102.5.  EKG shows STEMI.

## 2015-12-06 NOTE — ED Provider Notes (Signed)
CSN: [id number]     Arrival date & time 12/06/15  1805 History   First MD Initiated Contact with Patient 12/06/15 1825     Chief Complaint  Patient presents with  . Code Sepsis     (Consider location/radiation/quality/duration/timing/severity/associated sxs/prior Treatment) HPI   16 y m w PMH ESRD on HD, last dialysis today who came home from HD and wasn't acting himself per his wife, more tired than usual.  He had no focal deficits, no headache/neck pain/cp/sob/cough/abdominal pain. No dysuria but makes little urine usually only urinating on the weekends.   Past Medical History  Diagnosis Date  . ANEMIA-IRON DEFICIENCY 03/09/2008  . DIVERTICULOSIS, COLON 03/09/2008  . GOUT 03/09/2008  . HYPERLIPIDEMIA 03/09/2008  . HYPERTENSION 03/09/2008  . PROSTATE CANCER, HX OF 03/09/2008  . ESRD (end stage renal disease) ([place]) 03/09/2008  . Complex renal cyst 06/19/2011  . Hyperparathyroidism ([place])   . Prostate cancer [facility]) 2002    Completed external beam radiation 2003.per HPI  . Arthritis     cervical spine.   Marland Kitchen History of blood transfusion   . Depression 03/26/2014  . Hemorrhoids 08/2009    internal.   . Radiation cystitis 2010.  Marland Kitchen Stroke [facility])    Past Surgical History  Procedure Laterality Date  . Tonsillectomy    . Rotator cuff repair right  4/08  . S/p left hip replacement  2007    Dr. Percell Miller ortho  . Joint replacement      HIP  . Av fistula placement Left 01/24/2013    Procedure: INSERTION OF ARTERIOVENOUS (AV) GORE-TEX GRAFT ARM;  Surgeon: Rosetta Posner, MD;  Location: [place];  Service: Vascular;  Laterality: Left;  . Flexible sigmoidoscopy N/A 06/15/2015    Procedure: FLEXIBLE SIGMOIDOSCOPY;  Surgeon: Gatha Mayer, MD;  Location: [place];  Service: Endoscopy;  Laterality: N/A;  . Colonoscopy N/A 06/17/2015    Procedure: COLONOSCOPY;  Surgeon: Gatha Mayer, MD;  Location: [place];  Service: Endoscopy;  Laterality: N/A;  . Esophagogastroduodenoscopy N/A 06/17/2015    Procedure:  ESOPHAGOGASTRODUODENOSCOPY (EGD);  Surgeon: Gatha Mayer, MD;  Location: [facility] ENDOSCOPY;  Service: Endoscopy;  Laterality: N/A;  . Givens capsule study N/A 07/06/2015    Procedure: GIVENS CAPSULE STUDY;  Surgeon: Milus Banister, MD;  Location: [place];  Service: Endoscopy;  Laterality: N/A;   Family History  Problem Relation Age of Onset  . Hypertension Father   . Diabetes Father   . Cancer Father     prostate cancer  . Hypertension Brother   . Cancer Brother    Social History  Substance Use Topics  . Smoking status: Former Smoker -- 5 years    Types: Cigarettes    Quit date: 09/17/1982  . Smokeless tobacco: Never Used  . Alcohol Use: No     Comment: rare    Review of Systems  Constitutional: Negative for fever and chills.  Eyes: Negative for redness.  Respiratory: Negative for cough and shortness of breath.   Cardiovascular: Negative for chest pain.  Gastrointestinal: Negative for nausea, vomiting, abdominal pain and diarrhea.  Genitourinary: Negative for dysuria.  Skin: Negative for rash.  Neurological: Negative for headaches.  All other systems reviewed and are negative.     Allergies  Review of patient's allergies indicates no known allergies.  Home Medications   Prior to Admission medications   Medication Sig Start Date End Date Taking? Authorizing Provider  allopurinol (ZYLOPRIM) 100 MG tablet Take 1 tablet (100 mg total)  by mouth daily. 06/28/15  Yes [name], MD  aspirin 81 MG tablet Take 1 tablet (81 mg total) by mouth daily. 07/11/15  Yes Shanker Kristeen Mans, MD  atenolol (TENORMIN) 50 MG tablet Take 50 mg by mouth. 1 tab by mouth daily except for HALF on Mon-Wed-Fri 08/02/15  Yes Historical Provider, MD  citalopram (CELEXA) 10 MG tablet Take 1 tablet (10 mg total) by mouth daily. 07/23/15  Yes [name], MD  docusate sodium (COLACE) 100 MG capsule Take 2 capsules (200 mg total) by mouth 2 (two) times daily. 06/22/15  Yes Thurnell Lose, MD   doxercalciferol (HECTOROL) 0.5 MCG capsule Take 1.5 mcg by mouth 3 (three) times a week. MWF   Yes Historical Provider, MD  rosuvastatin (CRESTOR) 20 MG tablet Take 1 tablet (20 mg total) by mouth at bedtime. 05/13/14  Yes [name], MD  Specialty Vitamins Products (ONE-A-DAY ENERGY FORMULA PO) Take 1 capsule by mouth daily.   Yes Historical Provider, MD  Nutritional Supplements (FEEDING SUPPLEMENT, NEPRO CARB STEADY,) LIQD Take 237 mLs by mouth as needed (missed meal during dialysis.). Patient not taking: Reported on 12/06/2015 07/10/15   Jonetta Osgood, MD   BP 151/67 mmHg  Pulse 90  Temp(Src) 101.3 F (38.5 C) (Oral)  Resp 25  Ht 5\' 7"  (1.702 m)  Wt 67.586 kg  BMI 23.33 kg/m2  SpO2 100% Physical Exam  Constitutional: He is oriented to person, place, and time. No distress.  HENT:  Head: Normocephalic and atraumatic.  Eyes: EOM are normal. Pupils are equal, round, and reactive to light.  Neck: Normal range of motion. Neck supple.  Cardiovascular: Normal rate.   Pulmonary/Chest: Effort normal. No respiratory distress.  Abdominal: Soft. There is no tenderness.  Musculoskeletal: Normal range of motion.  Neurological: He is alert and oriented to person, place, and time. He has normal strength. No cranial nerve deficit or sensory deficit. Coordination normal. GCS eye subscore is 4. GCS verbal subscore is 5. GCS motor subscore is 6.  Skin: No rash noted. He is not diaphoretic.  Psychiatric: He has a normal mood and affect.    ED Course  Procedures (including critical care time) Labs Review Labs Reviewed  COMPREHENSIVE METABOLIC PANEL - Abnormal; Notable for the following:    Chloride 94 (*)    Glucose, Bld 128 (*)    BUN 22 (*)    Creatinine, Ser 5.93 (*)    ALT 14 (*)    GFR calc non Af Amer 8 (*)    GFR calc Af Amer 9 (*)    Anion gap 18 (*)    All other components within normal limits  CBC WITH DIFFERENTIAL/PLATELET - Abnormal; Notable for the following:    RBC 4.10 (*)     Hemoglobin 12.2 (*)    HCT 38.1 (*)    RDW 19.5 (*)    Platelets 122 (*)    Neutro Abs 7.8 (*)    All other components within normal limits  I-STAT CG4 LACTIC ACID, ED - Abnormal; Notable for the following:    Lactic Acid, Venous 2.59 (*)    All other components within normal limits  I-STAT CHEM 8, ED - Abnormal; Notable for the following:    Chloride 94 (*)    BUN 29 (*)    Creatinine, Ser 5.70 (*)    Glucose, Bld 125 (*)    Calcium, Ion 0.94 (*)    All other components within normal limits  I-STAT TROPOININ,  ED - Abnormal; Notable for the following:    Troponin i, poc 0.17 (*)    All other components within normal limits  CULTURE, BLOOD (ROUTINE X 2)  CULTURE, BLOOD (ROUTINE X 2)  URINE CULTURE  URINALYSIS, ROUTINE W REFLEX MICROSCOPIC (NOT AT [facility])    Imaging Review Dg Chest 2 View  12/06/2015  CLINICAL DATA:  Altered mental status since dialysis today. EXAM: CHEST  2 VIEW COMPARISON:  Radiographs 01/24/2013. FINDINGS: There are lower lung volumes. The heart size is at the upper limits of normal for AP technique. There is increased vascular congestion with patchy left mid right basilar airspace opacities. There is no consolidation or significant pleural effusion. Degenerative changes are present throughout the thoracic spine. There are postsurgical changes at the right shoulder. IMPRESSION: Patchy left-greater-than-right basilar airspace opacities are associated with lower lung volumes and may reflect atelectasis. Mild vascular congestion without overt pulmonary edema. Electronically Signed   By: Richardean Sale M.D.   On: 12/06/2015 19:15   I have personally reviewed and evaluated these images and lab results as part of my medical decision-making.   EKG Interpretation   Date/Time:  Monday December 06 2015 18:23:03 EST Ventricular Rate:  98 PR Interval:  206 QRS Duration: 90 QT Interval:  373 QTC Calculation: 476 R Axis:   -58 Text Interpretation:  Sinus rhythm Probable  left atrial enlargement RSR'  in V1 or V2, right VCD or RVH Inferior infarct, old Borderline ST  elevation, anterior leads No significant change since last tracing  Confirmed by YAO  MD, DAVID ([id number]) on 12/06/2015 7:49:02 PM      MDM   Final diagnoses:  Fever in adult    72 y m w PMH ESRD on HD, last dialysis today who came home from HD and wasn't acting himself per his wife.    On exam, A x Ox3.  No focal deficits.  No obvious infectious sources.  He is febrile to 102.5. Will obtain infectious work up with cbc/cmp/lactic/cxr. Will cover w/ vanc/zosyn for possible bacteremia given fever after HD today  Labs unremarkble.  cxr unremarkable.  Initial ekg w/ some coved pattern in V2 and I have obtained trop.  First trop positive at 0.17.  He has no cp/sob, doubt acs.  Will cycle trops, asa given.  Feel infection more likely at this point than acs.    Have spoken w hosp regarding this pt and they will be admitting to their service for further eva.    Jarome Matin, MD 12/07/15 0001  Wandra Arthurs, MD 12/07/15 [phone]

## 2015-12-06 NOTE — ED Notes (Signed)
Admitting at bedside 

## 2015-12-06 NOTE — ED Notes (Signed)
Bolus complete.  

## 2015-12-06 NOTE — H&P (Signed)
[facility] History and Physical  [name] [id number] DOB: [birth year] DOA: 12/06/2015  Referring physician: ED physician PCP: [name], MD  Specialists: Dr. Erlinda Hong (neurology), Dr. [name] (GI)  Chief Complaint:  Fatigue, AMS   HPI: [name] is a 82 y.o. male with PMH of ESRD, gout, and lower GI hemorrhage who presents to the ED with fatigue and altered mental status of 1-2 days duration. History is obtained from the patient, his wife, and ED physician. [name] was apparently in his usual state of health until yesterday when his wife noted that he was moving more slowly and management in a bit confused. This morning, he went to his regularly scheduled dialysis appointment, which he tolerated without incident. He was noted by his wife to be increasingly fatigued and confused over the course of the afternoon and was brought in for evaluation. Patient denies any chest pain, headaches, cough, abdominal pain, nausea, vomiting, diarrhea, or constipation. He urinates approximately once a week and denies dysuria or lower abdominal tenderness. He denies any focal numbness or weakness. Is been no recent long distance travel or sick contacts.   In ED, patient was found to be febrile to 39.2 C, saturating well on room air, and with vital signs stable. Initial blood work revealed a mild normocytic anemia with thrombocytopenia. There was no leukocytosis, but a absolute neutrophilia is present. Chest x-ray demonstrated patchy bibasilar opacities, left greater than right, possibly indicative of a pneumonia. No urine could be obtained for analysis. Lactic acid returned elevated at a value of 2.59 and troponin was elevated to 0.17. The patient denied any chest pain or palpitations and EKG featured a borderline ST elevation in leads V2 and V3. Blood cultures were obtained, a 1 L normal saline bolus was given, a full dose aspirin chew was administered, and the patient was treated empirically with  vancomycin and Zosyn for sepsis of unknown origin. The patient remained stable in the emergency department and will be admitted for ongoing evaluation and management.  Where does patient live?   At home     Can patient participate in ADLs?  Yes         Review of Systems:   General:  Fatigue, no fevers, chills, sweats, weight change, or poor appetite HEENT: no blurry vision, hearing changes or sore throat Pulm: no dyspnea, cough, or wheeze CV: no chest pain or palpitations Abd: no nausea, vomiting, abdominal pain, diarrhea, or constipation.  GU: no dysuria, hematuria, increased urinary frequency, or urgency  Ext: no leg edema Neuro: no focal weakness, numbness, or tingling, no vision change or hearing loss. Transient confusion per wife.  Skin: no rash, no wounds MSK: No muscle spasm, no deformity, no red, hot, or swollen joint. Chronic, intermittent Lt flank pain Heme: No easy bruising or bleeding Travel history: No recent long distant travel    Allergy: No Known Allergies  Past Medical History  Diagnosis Date  . ANEMIA-IRON DEFICIENCY 03/09/2008  . DIVERTICULOSIS, COLON 03/09/2008  . GOUT 03/09/2008  . HYPERLIPIDEMIA 03/09/2008  . HYPERTENSION 03/09/2008  . PROSTATE CANCER, HX OF 03/09/2008  . ESRD (end stage renal disease) ([place]) 03/09/2008  . Complex renal cyst 06/19/2011  . Hyperparathyroidism ([place])   . Prostate cancer [facility]) 2002    Completed external beam radiation 2003.per HPI  . Arthritis     cervical spine.   Marland Kitchen History of blood transfusion   . Depression 03/26/2014  . Hemorrhoids 08/2009    internal.   . Radiation  cystitis 2010.  Marland Kitchen Stroke [facility])     Past Surgical History  Procedure Laterality Date  . Tonsillectomy    . Rotator cuff repair right  4/08  . S/p left hip replacement  2007    Dr. Percell Miller ortho  . Joint replacement      HIP  . Av fistula placement Left 01/24/2013    Procedure: INSERTION OF ARTERIOVENOUS (AV) GORE-TEX GRAFT ARM;  Surgeon: Rosetta Posner, MD;  Location:  [place];  Service: Vascular;  Laterality: Left;  . Flexible sigmoidoscopy N/A 06/15/2015    Procedure: FLEXIBLE SIGMOIDOSCOPY;  Surgeon: Gatha Mayer, MD;  Location: [place];  Service: Endoscopy;  Laterality: N/A;  . Colonoscopy N/A 06/17/2015    Procedure: COLONOSCOPY;  Surgeon: Gatha Mayer, MD;  Location: [place];  Service: Endoscopy;  Laterality: N/A;  . Esophagogastroduodenoscopy N/A 06/17/2015    Procedure: ESOPHAGOGASTRODUODENOSCOPY (EGD);  Surgeon: Gatha Mayer, MD;  Location: [facility] ENDOSCOPY;  Service: Endoscopy;  Laterality: N/A;  . Givens capsule study N/A 07/06/2015    Procedure: GIVENS CAPSULE STUDY;  Surgeon: Milus Banister, MD;  Location: [place];  Service: Endoscopy;  Laterality: N/A;    Social History:  reports that he quit smoking about 33 years ago. His smoking use included Cigarettes. He quit after 5 years of use. He has never used smokeless tobacco. He reports that he does not drink alcohol or use illicit drugs.  Family History:  Family History  Problem Relation Age of Onset  . Hypertension Father   . Diabetes Father   . Cancer Father     prostate cancer  . Hypertension Brother   . Cancer Brother      Prior to Admission medications   Medication Sig Start Date End Date Taking? Authorizing Provider  allopurinol (ZYLOPRIM) 100 MG tablet Take 1 tablet (100 mg total) by mouth daily. 06/28/15  Yes [name], MD  aspirin 81 MG tablet Take 1 tablet (81 mg total) by mouth daily. 07/11/15  Yes Shanker Kristeen Mans, MD  atenolol (TENORMIN) 50 MG tablet Take 50 mg by mouth. 1 tab by mouth daily except for HALF on Mon-Wed-Fri 08/02/15  Yes Historical Provider, MD  citalopram (CELEXA) 10 MG tablet Take 1 tablet (10 mg total) by mouth daily. 07/23/15  Yes [name], MD  docusate sodium (COLACE) 100 MG capsule Take 2 capsules (200 mg total) by mouth 2 (two) times daily. 06/22/15  Yes Thurnell Lose, MD  doxercalciferol (HECTOROL) 0.5 MCG capsule Take 1.5 mcg by mouth 3  (three) times a week. MWF   Yes Historical Provider, MD  rosuvastatin (CRESTOR) 20 MG tablet Take 1 tablet (20 mg total) by mouth at bedtime. 05/13/14  Yes [name], MD  Specialty Vitamins Products (ONE-A-DAY ENERGY FORMULA PO) Take 1 capsule by mouth daily.   Yes Historical Provider, MD  Nutritional Supplements (FEEDING SUPPLEMENT, NEPRO CARB STEADY,) LIQD Take 237 mLs by mouth as needed (missed meal during dialysis.). Patient not taking: Reported on 12/06/2015 07/10/15   Jonetta Osgood, MD    Physical Exam: Filed Vitals:   12/06/15 2045 12/06/15 2050 12/06/15 2100 12/06/15 2115  BP: 131/60  140/65 139/60  Pulse: 84  85 80  Temp:  101.3 F (38.5 C)    TempSrc:      Resp: 21  27 18   Height:      Weight:      SpO2: 99%  100% 100%   General: Not in acute distress HEENT:  Eyes: PERRL, EOMI, no scleral icterus or conjunctival pallor.       ENT: No discharge from the ears or nose, no pharyngeal ulcers, petechiae or exudate, no tonsillar enlargement.        Neck: No JVD, no bruit, no appreciable mass Heme: No cervical adenopathy, no pallor Cardiac: Rate ~100 and regular, grade III holosystolic murmur throughout precordium. No gallops or rubs. Pulm: Good air movement bilaterally. No rales, wheezing, rhonchi or rubs. Abd: Soft, nondistended, nontender, no rebound pain or gaurding, no mass or organomegaly, BS present. Ext: No LE edema bilaterally. 1+DP/PT pulse bilaterally. Musculoskeletal: No gross deformity, no red, hot, swollen joints   Skin: No rashes or wounds on exposed surfaces  Neuro: Alert, oriented X3, cranial nerves II-XII grossly intact. No focal findings Psych: Patient is not overtly psychotic, appropriate mood and affect.  Labs on Admission:  Basic Metabolic Panel:  Recent Labs Lab 12/06/15 1950 12/06/15 1954  NA 136 139  K 4.0 4.3  CL 94* 94*  CO2  --  27  GLUCOSE 125* 128*  BUN 29* 22*  CREATININE 5.70* 5.93*  CALCIUM  --  8.9   Liver Function  Tests:  Recent Labs Lab 12/06/15 1954  AST 26  ALT 14*  ALKPHOS 53  BILITOT 0.8  PROT 6.7  ALBUMIN 3.7   No results for input(s): LIPASE, AMYLASE in the last 168 hours. No results for input(s): AMMONIA in the last 168 hours. CBC:  Recent Labs Lab 12/06/15 1950 12/06/15 1954  WBC  --  9.3  NEUTROABS  --  7.8*  HGB 13.6 12.2*  HCT 40.0 38.1*  MCV  --  92.9  PLT  --  122*   Cardiac Enzymes: No results for input(s): CKTOTAL, CKMB, CKMBINDEX, TROPONINI in the last 168 hours.  BNP (last 3 results) No results for input(s): BNP in the last 8760 hours.  ProBNP (last 3 results) No results for input(s): PROBNP in the last 8760 hours.  CBG: No results for input(s): GLUCAP in the last 168 hours.  Radiological Exams on Admission: Dg Chest 2 View  12/06/2015  CLINICAL DATA:  Altered mental status since dialysis today. EXAM: CHEST  2 VIEW COMPARISON:  Radiographs 01/24/2013. FINDINGS: There are lower lung volumes. The heart size is at the upper limits of normal for AP technique. There is increased vascular congestion with patchy left mid right basilar airspace opacities. There is no consolidation or significant pleural effusion. Degenerative changes are present throughout the thoracic spine. There are postsurgical changes at the right shoulder. IMPRESSION: Patchy left-greater-than-right basilar airspace opacities are associated with lower lung volumes and may reflect atelectasis. Mild vascular congestion without overt pulmonary edema. Electronically Signed   By: Richardean Sale M.D.   On: 12/06/2015 19:15    EKG: Independently reviewed.  Abnormal findings:   Sinus rhythm, RSR' in V2, borderline ST-elevation anterior lead  Assessment/Plan  1. Sepsis, source not yet identified  - Febrile to 39.3 C on admission; no leukocytosis, but absolute neutrophilia; lactate 2.59 initially  - CXR with patchy bibasilar opacities representing atelectasis vs infiltrate  - UA and culture ordered,  though pt only urinates ~ once per wk  - Abd exam benign and no GI complaints  - No wounds or red, hot, swollen joint  - Suspected source is PNA vs bacteremia  - Blood cultures obtained, will follow  - Trend lactate, procalcitonin  - Empiric vanc and Zosyn; tailor according to culture data and clinical course   2. ESRD -  On HD MWF  - Dialyzed 12/05/14 without incident  - No significant electrolyte derangement or volume overload at this time  - Caution with IVF   3. Hypertension  - A little above goal in ED  - Will cautiously continue home-dose atenolol   4. Troponin elevation  - Initial troponin elevated to 0.17  - EKG has borderline ST change in anterior lead, similar morphology seen inconsistently on prior studies  - Pt denies CP, palpitations, SOB, N/V, or diaphoresis  - ASA 324 mg chew given  - Suspect this is secondary to stress from sepsis and expect resolution with treatment of sepsis  - Will trend troponin and repeat EKG in the AM, sooner for CP  - Pt has h/o recent GI hemorrhage requiring 5 units pRBCs, so hesitant to start heparin infusion   5. Anemia, thrombocytopenia  - Hgb 12.2 on admission, representing improvement from prior measurements; normocytic, likely secondary to ESRD  - Platelets 122 on arrival, stable from priors, etiology unknown  - No suggestion of active blood loss  - Will trend    DVT ppx: SQ Heparin     Code Status: Full code Family Communication:  Yes, patient's wife at bed side Disposition Plan: Admit to inpatient   Date of Service 12/06/2015    Vianne Bulls, MD [facility] Pager [phone]  If 7PM-7AM, please contact night-coverage [email or web address] Password [facility] 12/06/2015, 9:36 PM

## 2015-12-06 NOTE — ED Notes (Addendum)
Patient transported to X-ray. Attempted to draw labs prior to transport. MD aware

## 2015-12-06 NOTE — ED Notes (Signed)
Attempted report 

## 2015-12-07 DIAGNOSIS — I158 Other secondary hypertension: Secondary | ICD-10-CM | POA: Diagnosis not present

## 2015-12-07 DIAGNOSIS — N186 End stage renal disease: Secondary | ICD-10-CM | POA: Diagnosis not present

## 2015-12-07 DIAGNOSIS — G9340 Encephalopathy, unspecified: Secondary | ICD-10-CM

## 2015-12-07 DIAGNOSIS — Z992 Dependence on renal dialysis: Secondary | ICD-10-CM | POA: Diagnosis not present

## 2015-12-07 LAB — URINALYSIS, ROUTINE W REFLEX MICROSCOPIC
Bilirubin Urine: NEGATIVE
GLUCOSE, UA: NEGATIVE mg/dL
Ketones, ur: NEGATIVE mg/dL
Leukocytes, UA: NEGATIVE
Nitrite: NEGATIVE
Protein, ur: 100 mg/dL — AB
SPECIFIC GRAVITY, URINE: 1.017 (ref 1.005–1.030)
pH: 7.5 (ref 5.0–8.0)

## 2015-12-07 LAB — COMPREHENSIVE METABOLIC PANEL
ALT: 15 U/L — AB (ref 17–63)
AST: 21 U/L (ref 15–41)
Albumin: 2.8 g/dL — ABNORMAL LOW (ref 3.5–5.0)
Alkaline Phosphatase: 46 U/L (ref 38–126)
Anion gap: 12 (ref 5–15)
BUN: 28 mg/dL — AB (ref 6–20)
CALCIUM: 8.4 mg/dL — AB (ref 8.9–10.3)
CO2: 31 mmol/L (ref 22–32)
Chloride: 97 mmol/L — ABNORMAL LOW (ref 101–111)
Creatinine, Ser: 6.72 mg/dL — ABNORMAL HIGH (ref 0.61–1.24)
GFR calc Af Amer: 8 mL/min — ABNORMAL LOW (ref >60)
GFR, EST NON AFRICAN AMERICAN: 7 mL/min — AB (ref >60)
GLUCOSE: 122 mg/dL — AB (ref 65–99)
Potassium: 3.8 mmol/L (ref 3.5–5.1)
Sodium: 140 mmol/L (ref 135–145)
TOTAL PROTEIN: 5.4 g/dL — AB (ref 6.5–8.1)
Total Bilirubin: 0.9 mg/dL (ref 0.3–1.2)

## 2015-12-07 LAB — URINE MICROSCOPIC-ADD ON

## 2015-12-07 LAB — TROPONIN I
Troponin I: 0.12 ng/mL — ABNORMAL HIGH (ref <0.031)
Troponin I: 0.12 ng/mL — ABNORMAL HIGH (ref <0.031)

## 2015-12-07 LAB — LACTIC ACID, PLASMA: Lactic Acid, Venous: 1.5 mmol/L (ref 0.5–2.0)

## 2015-12-07 LAB — MRSA PCR SCREENING: MRSA by PCR: NEGATIVE

## 2015-12-07 LAB — GLUCOSE, CAPILLARY: Glucose-Capillary: 91 mg/dL (ref 65–99)

## 2015-12-07 MED ORDER — RENA-VITE PO TABS
1.0000 | ORAL_TABLET | Freq: Every day | ORAL | Status: DC
  Administered 2015-12-07 – 2015-12-08 (×2): 1 via ORAL
  Filled 2015-12-07 (×2): qty 1

## 2015-12-07 MED ORDER — NEPRO/CARBSTEADY PO LIQD
237.0000 mL | Freq: Two times a day (BID) | ORAL | Status: DC
Start: 2015-12-07 — End: 2015-12-09
  Administered 2015-12-07 – 2015-12-09 (×4): 237 mL via ORAL

## 2015-12-07 MED ORDER — ATENOLOL 50 MG PO TABS
50.0000 mg | ORAL_TABLET | ORAL | Status: DC
  Administered 2015-12-07: 50 mg via ORAL
  Filled 2015-12-07: qty 1

## 2015-12-07 MED ORDER — ATENOLOL 25 MG PO TABS
25.0000 mg | ORAL_TABLET | ORAL | Status: DC
  Administered 2015-12-08: 25 mg via ORAL
  Filled 2015-12-07: qty 1

## 2015-12-07 MED ORDER — SODIUM CHLORIDE 0.9 % IV SOLN
125.0000 mg | INTRAVENOUS | Status: DC
  Administered 2015-12-08: 125 mg via INTRAVENOUS
  Filled 2015-12-07 (×2): qty 10

## 2015-12-07 NOTE — Consult Note (Signed)
[facility] Renal Consultation Note    Indication for Consultation:  Management of ESRD/hemodialysis; anemia, hypertension/volume and secondary hyperparathyroidism PCP: [name], MD   HPI: [name] is a 82 y.o. male with ESRD on hemodialysis at [place] center on MWF. Past medical history significant for hypertensions, CVA, prostate cancer, gout, diverticulosis/GI bleed, secondary hyperparathyroidism, anemia of chronic disease, hyperlipidemia, renal cyst, arthritis. He lives at home with his wife of 44 years. He has been in his normal state of health. Yesterday, he was noted to be having episodes of confusion and fatigue. He was brought to [facility] ED for evaluation. Upon arrival to ED, temperature was 101.3, CXR showed patchy opacities L > R with venous congestion but no pulmonary edema.WBC 9.3 Lactic acid was 1.7-repeat 1.5. EKG was had borderline ST elevation, troponin 0.17.  Blood cultures were done, unable to obtain urine cultures and patient was start empirically on vancomycin and zosyn. He received a liter bolus of NS although there is no documented hypotension. He has been admitted for possible sepsis.  Patient currently awake, alert, oriented X 3. "I came because my wife said I wasn't acting right". He denies chest pain, SOB, DOE, weakness, did have ever on arrival to Ed, denies nausea, vomiting, diarrhea, headaches, changes in vision, tinnitus, tarry stools, hematochezia, flank pain, abdominal pain.  Wife is at bedside. HE seems to be at baseline now   Patient has HD at [facility] MWF. He attends treatments as prescribed. Last incenter labs values: Hgb 11.6 WBC 4.66 Ferritin 1079 Tsat 16% Phos 4.9 Ca 10.5 C Ca 10.3 PTH 233. He is not on binders, Calcimimetic or vitamin D.    Past Medical History  Diagnosis Date  . ANEMIA-IRON DEFICIENCY 03/09/2008  . DIVERTICULOSIS, COLON 03/09/2008  . GOUT 03/09/2008  . HYPERLIPIDEMIA 03/09/2008  .  HYPERTENSION 03/09/2008  . PROSTATE CANCER, HX OF 03/09/2008  . ESRD (end stage renal disease) ([place]) 03/09/2008  . Complex renal cyst 06/19/2011  . Hyperparathyroidism ([place])   . Prostate cancer [facility]) 2002    Completed external beam radiation 2003.per HPI  . Arthritis     cervical spine.   Marland Kitchen History of blood transfusion   . Depression 03/26/2014  . Hemorrhoids 08/2009    internal.   . Radiation cystitis 2010.  Marland Kitchen Stroke [facility])    Past Surgical History  Procedure Laterality Date  . Tonsillectomy    . Rotator cuff repair right  4/08  . S/p left hip replacement  2007    Dr. Percell Miller ortho  . Joint replacement      HIP  . Av fistula placement Left 01/24/2013    Procedure: INSERTION OF ARTERIOVENOUS (AV) GORE-TEX GRAFT ARM;  Surgeon: Rosetta Posner, MD;  Location: [place];  Service: Vascular;  Laterality: Left;  . Flexible sigmoidoscopy N/A 06/15/2015    Procedure: FLEXIBLE SIGMOIDOSCOPY;  Surgeon: Gatha Mayer, MD;  Location: [place];  Service: Endoscopy;  Laterality: N/A;  . Colonoscopy N/A 06/17/2015    Procedure: COLONOSCOPY;  Surgeon: Gatha Mayer, MD;  Location: [place];  Service: Endoscopy;  Laterality: N/A;  . Esophagogastroduodenoscopy N/A 06/17/2015    Procedure: ESOPHAGOGASTRODUODENOSCOPY (EGD);  Surgeon: Gatha Mayer, MD;  Location: [facility] ENDOSCOPY;  Service: Endoscopy;  Laterality: N/A;  . Givens capsule study N/A 07/06/2015    Procedure: GIVENS CAPSULE STUDY;  Surgeon: Milus Banister, MD;  Location: [place];  Service: Endoscopy;  Laterality: N/A;   Family History  Problem Relation  Age of Onset  . Hypertension Father   . Diabetes Father   . Cancer Father     prostate cancer  . Hypertension Brother   . Cancer Brother    Social History:  reports that he quit smoking about 33 years ago. His smoking use included Cigarettes. He quit after 5 years of use. He has never used smokeless tobacco. He reports that he does not drink alcohol or use illicit drugs. No Known  Allergies Prior to Admission medications   Medication Sig Start Date End Date Taking? Authorizing Provider  allopurinol (ZYLOPRIM) 100 MG tablet Take 1 tablet (100 mg total) by mouth daily. 06/28/15  Yes [name], MD  aspirin 81 MG tablet Take 1 tablet (81 mg total) by mouth daily. 07/11/15  Yes Shanker Kristeen Mans, MD  atenolol (TENORMIN) 50 MG tablet Take 50 mg by mouth. 1 tab by mouth daily except for HALF on Mon-Wed-Fri 08/02/15  Yes Historical Provider, MD  citalopram (CELEXA) 10 MG tablet Take 1 tablet (10 mg total) by mouth daily. 07/23/15  Yes [name], MD  docusate sodium (COLACE) 100 MG capsule Take 2 capsules (200 mg total) by mouth 2 (two) times daily. 06/22/15  Yes Thurnell Lose, MD  doxercalciferol (HECTOROL) 0.5 MCG capsule Take 1.5 mcg by mouth 3 (three) times a week. MWF   Yes Historical Provider, MD  rosuvastatin (CRESTOR) 20 MG tablet Take 1 tablet (20 mg total) by mouth at bedtime. 05/13/14  Yes [name], MD  Specialty Vitamins Products (ONE-A-DAY ENERGY FORMULA PO) Take 1 capsule by mouth daily.   Yes Historical Provider, MD  Nutritional Supplements (FEEDING SUPPLEMENT, NEPRO CARB STEADY,) LIQD Take 237 mLs by mouth as needed (missed meal during dialysis.). Patient not taking: Reported on 12/06/2015 07/10/15   Jonetta Osgood, MD   Current Facility-Administered Medications  Medication Dose Route Frequency Provider Last Rate Last Dose  . allopurinol (ZYLOPRIM) tablet 100 mg  100 mg Oral Daily Vianne Bulls, MD   100 mg at 12/07/15 1059  . aspirin EC tablet 81 mg  81 mg Oral Daily Ilene Qua Opyd, MD   81 mg at 12/07/15 1100  . atenolol (TENORMIN) tablet 50 mg  50 mg Oral Daily Vianne Bulls, MD   50 mg at 12/07/15 1059  . citalopram (CELEXA) tablet 10 mg  10 mg Oral Daily Vianne Bulls, MD   10 mg at 12/07/15 1059  . docusate sodium (COLACE) capsule 200 mg  200 mg Oral BID Vianne Bulls, MD   200 mg at 12/07/15 1059  . [START ON 12/08/2015] doxercalciferol (HECTOROL)  capsule 1.5 mcg  1.5 mcg Oral Once per day on Mon Wed Fri Timothy S Opyd, MD      . heparin injection 5,000 Units  5,000 Units Subcutaneous 3 times per day Vianne Bulls, MD   5,000 Units at 12/07/15 [id number]  . piperacillin-tazobactam (ZOSYN) IVPB 2.25 g  2.25 g Intravenous Q8H Rebecka Apley, RPH   2.25 g at 12/07/15 0510  . rosuvastatin (CRESTOR) tablet 20 mg  20 mg Oral QHS Timothy S Opyd, MD      . sodium chloride flush (NS) 0.9 % injection 3 mL  3 mL Intravenous Q12H Ilene Qua Opyd, MD   3 mL at 12/07/15 1000  . [START ON 12/08/2015] vancomycin (VANCOCIN) IVPB 750 mg/150 ml premix  750 mg Intravenous Q M,W,F-HD Rebecka Apley, [facility]       Labs: Basic Metabolic  Panel:  Recent Labs Lab 12/06/15 1950 12/06/15 1954 12/07/15 0325  NA 136 139 140  K 4.0 4.3 3.8  CL 94* 94* 97*  CO2  --  27 31  GLUCOSE 125* 128* 122*  BUN 29* 22* 28*  CREATININE 5.70* 5.93* 6.72*  CALCIUM  --  8.9 8.4*   Liver Function Tests:  Recent Labs Lab 12/06/15 1954 12/07/15 0325  AST 26 21  ALT 14* 15*  ALKPHOS 53 46  BILITOT 0.8 0.9  PROT 6.7 5.4*  ALBUMIN 3.7 2.8*   No results for input(s): LIPASE, AMYLASE in the last 168 hours. No results for input(s): AMMONIA in the last 168 hours. CBC:  Recent Labs Lab 12/06/15 1950 12/06/15 1954  WBC  --  9.3  NEUTROABS  --  7.8*  HGB 13.6 12.2*  HCT 40.0 38.1*  MCV  --  92.9  PLT  --  122*   Cardiac Enzymes:  Recent Labs Lab 12/06/15 2228 12/07/15 0302 12/07/15 0849  TROPONINI 0.11* 0.12* 0.12*   CBG:  Recent Labs Lab 12/07/15 0749  GLUCAP 91   Iron Studies: No results for input(s): IRON, TIBC, TRANSFERRIN, FERRITIN in the last 72 hours. Studies/Results: Dg Chest 2 View  12/06/2015  CLINICAL DATA:  Altered mental status since dialysis today. EXAM: CHEST  2 VIEW COMPARISON:  Radiographs 01/24/2013. FINDINGS: There are lower lung volumes. The heart size is at the upper limits of normal for AP technique. There is increased vascular congestion  with patchy left mid right basilar airspace opacities. There is no consolidation or significant pleural effusion. Degenerative changes are present throughout the thoracic spine. There are postsurgical changes at the right shoulder. IMPRESSION: Patchy left-greater-than-right basilar airspace opacities are associated with lower lung volumes and may reflect atelectasis. Mild vascular congestion without overt pulmonary edema. Electronically Signed   By: Richardean Sale M.D.   On: 12/06/2015 19:15    ROS: As per HPI otherwise negative.  Physical Exam: Filed Vitals:   12/06/15 2145 12/07/15 0521 12/07/15 0751 12/07/15 1100  BP: 144/60 114/56 103/52 110/58  Pulse: 78 68 64 62  Temp: 100 F (37.8 C) 98.5 F (36.9 C) 98.4 F (36.9 C)   TempSrc: Oral Oral Oral   Resp: 18 18 17    Height:      Weight:      SpO2: 96% 94% 96%      General: Well developed, well nourished, in no acute distress. Head: Normocephalic, atraumatic, sclera non-icteric, mucus membranes are moist Neck: Supple. JVD not elevated. Lungs: Clear bilaterally slightly decreased in bases. Breathing is unlabored. Heart: RRR with S1 S2. II/VI systolic M. No R/G. SR on monitor, rate 70s.  Abdomen: Soft, non-tender, non-distended with normoactive bowel sounds. No rebound/guarding. No obvious abdominal masses. M-S:  Strength and tone appear normal for age. Lower extremities:without edema or ischemic changes, no open wounds  Neuro: Alert and oriented X 3. Moves all extremities spontaneously. Psych:  Responds to questions appropriately with a normal affect. Dialysis Access: LFA AVG + thrill + bruit  Dialysis Orders: [facility]  on MWF . 3 hrs 30 min, 180NRe Optiflux, BFR 400, DFR Manual 800 mL/min, EDW 66.5 (kg), Dialysate 2.0 K, 2.0 Ca,  UFR Profile: None, Sodium Model: None, Access: LFA AV Graft Heparin: No heparin Venofer: 100 mg IV Q treatment   Assessment/Plan: 1.  AMS/ Sepsis-source is unclear: Per primary. Now  alert, oriented X 3. Afebrile at present. On Vanc/Zosyn, BCs pending. CXR suggests airspace opacities L > R.  Possible PNA. Improved back to baseline after ABX 2.  Elevated Troponin: per primary. Slight elevation, flat trend. Denies chest pain  3.  ESRD -  MWF at [facility]. Will have HD tomorrow. 4.  Hypertension/volume  - BP controlled. Takes Atenolol 25 mg PO q HS MWF and Atenolol 50 mg T,Th,S,S. Will have HD tomorrow on schedule. Wt 66.6 kg today after receiving 1 liter of fld in ED. Will assess UFG after wt tomorrow.  5.  Anemia  - HGB 12.2. Getting Fe load (venofer 100 mg IV X 5) for Tsat 16%. 1st dose due tomorrow.  6.  Metabolic bone disease - Ca 8.4 C Ca  9.36 last in center Phos 4.9. No binders/Vitamin D listed on medication list  7.  Nutrition - Albumin 2.8. Renal diet, renal vit/nepro.  8. Thrombocytopenia:  Platelets 122. Last in center platelet count 224. Will recheck in HD tomorrow.   Rita H. Owens Shark, NP-C 12/07/2015, 11:10 AM  [employer] [phone]  Patient seen and examined, agree with above note with above modifications. Well known to [place] - compliant dialysis patient- admitted with fever and cough- abnormal CXR- better after abx- is due for HD tomorrow- clinical scenario is looking like bronchitis/pna- plan for HD tomorrow and if still clinically well- possible discharge ??  Corliss Parish, MD 12/07/2015

## 2015-12-07 NOTE — Progress Notes (Signed)
PROGRESS NOTE    [name] [id number] DOB: 1928-12-23 DOA: 12/06/2015 PCP: [name], MD  HPI/Brief narrative 82 year old male patient with history of ESRD on MWF HD, HLD, HTN, anemia, gout, prostate cancer, GI bleed, presented to [facility] ED on 12/06/15 after having episodes of confusion and fatigue of 1-2 days duration. In the ED, patient was febrile 101.3, chest x-ray demonstrated patchy bibasilar opacities suggestive of pneumonia, lactate 2.59, troponin 0.17 (no reported chest pain). Patient was admitted for an measurement of sepsis of unclear source. Nephrology was consulted.  Assessment/Plan:   1. Sepsis, present on admission: Source unclear. DD: Pneumonia versus bacteremia. Started empirically on IV vancomycin and Zosyn. Blood cultures 2: Negative to date. MRSA PCR negative. No further fever since last night. Improving. 2. Acute encephalopathy: Possibly related to febrile illness. No focal deficits. Resolved. 3. ESRD on MWF HD: Nephrology consulted for dialysis needs. 4. Essential hypertension: Controlled. Continue home dose of atenolol. 5. Elevated troponin/demand ischemia: Plateaued. No reported chest pain. We will repeat 2-D echo and if shows reduced EF for wall motion abnormalities, may consider cardiology consultation. EKG without acute changes (confirmed with cardiologist on-call on 1/31). 6. Mild anemia: Follow CBCs 7. Thrombocytopenia: Follow CBCs.   DVT prophylaxis: Heparin Code Status: Full Family Communication: None at bedside Disposition Plan: DC home when medically stable.   Consultants:  Nephrology  Procedures:  None  Antimicrobials:  IV vancomycin 1/30 >  IV Zosyn 1/30 >   Subjective: Denies complaints. Denies headache, earache, sore throat, cough, dyspnea, chest pain, nausea, vomiting, diarrhea, dysuria or joint pains.  Objective: Filed Vitals:   12/06/15 2145 12/07/15 0521 12/07/15 0751 12/07/15 1100  BP: 144/60 114/56 103/52 110/58    Pulse: 78 68 64 62  Temp: 100 F (37.8 C) 98.5 F (36.9 C) 98.4 F (36.9 C)   TempSrc: Oral Oral Oral   Resp: 18 18 17    Height:      Weight:      SpO2: 96% 94% 96%     Intake/Output Summary (Last 24 hours) at 12/07/15 1514 Last data filed at 12/07/15 1000  Gross per 24 hour  Intake    910 ml  Output    125 ml  Net    785 ml   Filed Weights   12/06/15 1825 12/06/15 2115  Weight: 67.586 kg (149 lb) 66.679 kg (147 lb)    Exam:  General exam: Pleasant elderly male lying comfortably supine in bed. Does not look septic or toxic. Respiratory system: Clear. No increased work of breathing. Cardiovascular system: S1 & S2 heard, RRR. No JVD, murmurs, gallops, clicks or pedal edema. Gastrointestinal system: Abdomen is nondistended, soft and nontender. Normal bowel sounds heard. Central nervous system: Alert and oriented. No focal neurological deficits. Extremities: Symmetric 5 x 5 power.   Data Reviewed: Basic Metabolic Panel:  Recent Labs Lab 12/06/15 1950 12/06/15 1954 12/07/15 0325  NA 136 139 140  K 4.0 4.3 3.8  CL 94* 94* 97*  CO2  --  27 31  GLUCOSE 125* 128* 122*  BUN 29* 22* 28*  CREATININE 5.70* 5.93* 6.72*  CALCIUM  --  8.9 8.4*   Liver Function Tests:  Recent Labs Lab 12/06/15 1954 12/07/15 0325  AST 26 21  ALT 14* 15*  ALKPHOS 53 46  BILITOT 0.8 0.9  PROT 6.7 5.4*  ALBUMIN 3.7 2.8*   No results for input(s): LIPASE, AMYLASE in the last 168 hours. No results for input(s): AMMONIA in the last 168 hours.  CBC:  Recent Labs Lab 12/06/15 1950 12/06/15 1954  WBC  --  9.3  NEUTROABS  --  7.8*  HGB 13.6 12.2*  HCT 40.0 38.1*  MCV  --  92.9  PLT  --  122*   Cardiac Enzymes:  Recent Labs Lab 12/06/15 2228 12/07/15 0302 12/07/15 0849  TROPONINI 0.11* 0.12* 0.12*   BNP (last 3 results) No results for input(s): PROBNP in the last 8760 hours. CBG:  Recent Labs Lab 12/07/15 0749  GLUCAP 91    Recent Results (from the past 240  hour(s))  Blood Culture (routine x 2)     Status: None (Preliminary result)   Collection Time: 12/06/15  2:30 PM  Result Value Ref Range Status   Specimen Description BLOOD RIGHT ARM  Final   Special Requests BOTTLES DRAWN AEROBIC AND ANAEROBIC 5CC  Final   Culture NO GROWTH < 12 HOURS  Final   Report Status PENDING  Incomplete  Blood Culture (routine x 2)     Status: None (Preliminary result)   Collection Time: 12/06/15  2:35 PM  Result Value Ref Range Status   Specimen Description BLOOD RIGHT HAND  Final   Special Requests BOTTLES DRAWN AEROBIC AND ANAEROBIC 5CC  Final   Culture NO GROWTH < 12 HOURS  Final   Report Status PENDING  Incomplete  MRSA PCR Screening     Status: None   Collection Time: 12/06/15 11:47 PM  Result Value Ref Range Status   MRSA by PCR NEGATIVE NEGATIVE Final    Comment:        The GeneXpert MRSA Assay (FDA approved for NASAL specimens only), is one component of a comprehensive MRSA colonization surveillance program. It is not intended to diagnose MRSA infection nor to guide or monitor treatment for MRSA infections.          Studies: Dg Chest 2 View  12/06/2015  CLINICAL DATA:  Altered mental status since dialysis today. EXAM: CHEST  2 VIEW COMPARISON:  Radiographs 01/24/2013. FINDINGS: There are lower lung volumes. The heart size is at the upper limits of normal for AP technique. There is increased vascular congestion with patchy left mid right basilar airspace opacities. There is no consolidation or significant pleural effusion. Degenerative changes are present throughout the thoracic spine. There are postsurgical changes at the right shoulder. IMPRESSION: Patchy left-greater-than-right basilar airspace opacities are associated with lower lung volumes and may reflect atelectasis. Mild vascular congestion without overt pulmonary edema. Electronically Signed   By: Richardean Sale M.D.   On: 12/06/2015 19:15        Scheduled Meds: . allopurinol   100 mg Oral Daily  . aspirin EC  81 mg Oral Daily  . [START ON 12/08/2015] atenolol  25 mg Oral Once per day on Mon Wed Fri  . atenolol  50 mg Oral Once per day on Sun Tue Thu Sat  . citalopram  10 mg Oral Daily  . docusate sodium  200 mg Oral BID  . feeding supplement (NEPRO CARB STEADY)  237 mL Oral BID BM  . [START ON 12/08/2015] ferric gluconate (FERRLECIT/NULECIT) IV  125 mg Intravenous Q M,W,F-HD  . heparin  5,000 Units Subcutaneous 3 times per day  . multivitamin  1 tablet Oral QHS  . piperacillin-tazobactam (ZOSYN)  IV  2.25 g Intravenous Q8H  . rosuvastatin  20 mg Oral QHS  . sodium chloride flush  3 mL Intravenous Q12H  . [START ON 12/08/2015] vancomycin  750 mg Intravenous Q M,W,F-HD  Continuous Infusions:   Principal Problem:   Sepsis, unspecified organism [facility]) Active Problems:   Essential hypertension   History of GI bleed   ESRD on dialysis ([place])   Sepsis ([place])    Time spent: 40 minutes.    Vernell Leep, MD, FACP, FHM. [facility] Pager [phone]  If 7PM-7AM, please contact night-coverage [email or web address] Password [email or web address] 12/07/2015, 3:14 PM    LOS: 1 day

## 2015-12-07 NOTE — Progress Notes (Signed)
Utilization review completed. Denali Sharma, RN, BSN. 

## 2015-12-08 DIAGNOSIS — A419 Sepsis, unspecified organism: Principal | ICD-10-CM

## 2015-12-08 LAB — RENAL FUNCTION PANEL
Albumin: 2.8 g/dL — ABNORMAL LOW (ref 3.5–5.0)
Anion gap: 12 (ref 5–15)
BUN: 46 mg/dL — ABNORMAL HIGH (ref 6–20)
CO2: 26 mmol/L (ref 22–32)
Calcium: 8.4 mg/dL — ABNORMAL LOW (ref 8.9–10.3)
Chloride: 99 mmol/L — ABNORMAL LOW (ref 101–111)
Creatinine, Ser: 8.69 mg/dL — ABNORMAL HIGH (ref 0.61–1.24)
GFR calc Af Amer: 6 mL/min — ABNORMAL LOW (ref >60)
GFR calc non Af Amer: 5 mL/min — ABNORMAL LOW (ref >60)
Glucose, Bld: 91 mg/dL (ref 65–99)
Phosphorus: 5.4 mg/dL — ABNORMAL HIGH (ref 2.5–4.6)
Potassium: 3.9 mmol/L (ref 3.5–5.1)
Sodium: 137 mmol/L (ref 135–145)

## 2015-12-08 LAB — CBC
HCT: 31.5 % — ABNORMAL LOW (ref 39.0–52.0)
Hemoglobin: 10.2 g/dL — ABNORMAL LOW (ref 13.0–17.0)
MCH: 29.6 pg (ref 26.0–34.0)
MCHC: 32.4 g/dL (ref 30.0–36.0)
MCV: 91.3 fL (ref 78.0–100.0)
Platelets: 160 10*3/uL (ref 150–400)
RBC: 3.45 MIL/uL — ABNORMAL LOW (ref 4.22–5.81)
RDW: 19.3 % — ABNORMAL HIGH (ref 11.5–15.5)
WBC: 7 K/uL (ref 4.0–10.5)

## 2015-12-08 LAB — URINE CULTURE: Culture: NO GROWTH

## 2015-12-08 LAB — PROCALCITONIN: Procalcitonin: 5.93 ng/mL

## 2015-12-08 LAB — HEPATITIS B SURFACE ANTIGEN: Hepatitis B Surface Ag: NEGATIVE

## 2015-12-08 MED ORDER — LIDOCAINE-PRILOCAINE 2.5-2.5 % EX CREA
1.0000 "application " | TOPICAL_CREAM | CUTANEOUS | Status: DC | PRN
  Filled 2015-12-08: qty 5

## 2015-12-08 MED ORDER — SODIUM CHLORIDE 0.9 % IV SOLN: 100.0000 mL | INTRAVENOUS | Status: DC | PRN

## 2015-12-08 MED ORDER — PENTAFLUOROPROP-TETRAFLUOROETH EX AERO: 1.0000 "application " | INHALATION_SPRAY | CUTANEOUS | Status: DC | PRN

## 2015-12-08 MED ORDER — VANCOMYCIN HCL IN DEXTROSE 750-5 MG/150ML-% IV SOLN
INTRAVENOUS | Status: AC
  Administered 2015-12-08: 750 mg via INTRAVENOUS
  Filled 2015-12-08: qty 150

## 2015-12-08 MED ORDER — LIDOCAINE HCL (PF) 1 % IJ SOLN: 5.0000 mL | INTRAMUSCULAR | Status: DC | PRN

## 2015-12-08 NOTE — Progress Notes (Signed)
PROGRESS NOTE    [name] [id number] DOB: 01/15/1929 DOA: 12/06/2015 PCP: [name], MD  HPI/Brief narrative 82 year old male patient with history of ESRD on MWF HD, HLD, HTN, anemia, gout, prostate cancer, GI bleed, presented to [facility] ED on 12/06/15 after having episodes of confusion and fatigue of 1-2 days duration. In the ED, patient was febrile 101.3, chest x-ray demonstrated patchy bibasilar opacities suggestive of pneumonia, lactate 2.59, troponin 0.17 (no reported chest pain). Patient was admitted for an measurement of sepsis of unclear source. Nephrology was consulted.  Assessment/Plan:   1. Sepsis, present on admission: Source unclear. DD: Pneumonia versus bacteremia. Started empirically on IV vancomycin and Zosyn. : Negative to date. MRSA PCR negative. No further fever since last night. Improving. + blood culture x 1 bottle looking like contaminant. Will d/c vancomycin 2. Acute encephalopathy: Possibly related to febrile illness. No focal deficits. Resolved. 3. ESRD on MWF HD: Nephrology consulted for dialysis needs. 4. Essential hypertension: Controlled. Continue home dose of atenolol. 5. Elevated troponin/demand ischemia: Plateaued. No reported chest pain. We will repeat 2-D echo and if shows reduced EF for wall motion abnormalities, may consider cardiology consultation.  6. Mild anemia: Follow CBCs 7. Thrombocytopenia: Follow CBCs.   DVT prophylaxis: Heparin Code Status: Full Family Communication: None at bedside Disposition Plan: DC home when medically stable.   Consultants:  Nephrology  Procedures:  None  Antimicrobials:  IV vancomycin 1/30 > 2/1  IV Zosyn 1/30 >   Subjective: Pt resports no new complaints. States he feels better.  Objective: Filed Vitals:   12/08/15 1000 12/08/15 1035 12/08/15 1130 12/08/15 1721  BP: 126/65 125/65 134/62 117/51  Pulse: 59 63 66 68  Temp:  97.6 F (36.4 C) 98 F (36.7 C) 98.2 F (36.8 C)  TempSrc:  Oral  Oral Oral  Resp:  18 18 18   Height:      Weight:  65.3 kg (143 lb 15.4 oz)    SpO2:  95% 96% 98%    Intake/Output Summary (Last 24 hours) at 12/08/15 1728 Last data filed at 12/08/15 1700  Gross per 24 hour  Intake    940 ml  Output   1000 ml  Net    -60 ml   Filed Weights   12/07/15 2025 12/08/15 0647 12/08/15 1035  Weight: 68.8 kg (151 lb 10.8 oz) 66.6 kg (146 lb 13.2 oz) 65.3 kg (143 lb 15.4 oz)    Exam:  General exam: Pleasant elderly male lying comfortably supine in bed. Does not look septic or toxic. Alert and awake Respiratory system: Clear. No increased work of breathing, equal chest rise Cardiovascular system: S1 & S2 heard, RRR. No JVD, murmurs, gallops, clicks or pedal edema. Gastrointestinal system: Abdomen is nondistended, soft and nontender. Normal bowel sounds heard. Central nervous system: Alert and oriented. No focal neurological deficits. Extremities: equal tone   Data Reviewed: Basic Metabolic Panel:  Recent Labs Lab 12/06/15 1950 12/06/15 1954 12/07/15 0325 12/08/15 0709  NA 136 139 140 137  K 4.0 4.3 3.8 3.9  CL 94* 94* 97* 99*  CO2  --  27 31 26   GLUCOSE 125* 128* 122* 91  BUN 29* 22* 28* 46*  CREATININE 5.70* 5.93* 6.72* 8.69*  CALCIUM  --  8.9 8.4* 8.4*  PHOS  --   --   --  5.4*   Liver Function Tests:  Recent Labs Lab 12/06/15 1954 12/07/15 0325 12/08/15 0709  AST 26 21  --   ALT 14* 15*  --  ALKPHOS 53 46  --   BILITOT 0.8 0.9  --   PROT 6.7 5.4*  --   ALBUMIN 3.7 2.8* 2.8*   No results for input(s): LIPASE, AMYLASE in the last 168 hours. No results for input(s): AMMONIA in the last 168 hours. CBC:  Recent Labs Lab 12/06/15 1950 12/06/15 1954 12/08/15 0709  WBC  --  9.3 7.0  NEUTROABS  --  7.8*  --   HGB 13.6 12.2* 10.2*  HCT 40.0 38.1* 31.5*  MCV  --  92.9 91.3  PLT  --  122* 160   Cardiac Enzymes:  Recent Labs Lab 12/06/15 2228 12/07/15 0302 12/07/15 0849  TROPONINI 0.11* 0.12* 0.12*   BNP (last 3  results) No results for input(s): PROBNP in the last 8760 hours. CBG:  Recent Labs Lab 12/07/15 0749  GLUCAP 91    Recent Results (from the past 240 hour(s))  Blood Culture (routine x 2)     Status: None (Preliminary result)   Collection Time: 12/06/15  2:30 PM  Result Value Ref Range Status   Specimen Description BLOOD RIGHT ARM  Final   Special Requests BOTTLES DRAWN AEROBIC AND ANAEROBIC 5CC  Final   Culture NO GROWTH 2 DAYS  Final   Report Status PENDING  Incomplete  Blood Culture (routine x 2)     Status: None (Preliminary result)   Collection Time: 12/06/15  2:35 PM  Result Value Ref Range Status   Specimen Description BLOOD RIGHT HAND  Final   Special Requests BOTTLES DRAWN AEROBIC AND ANAEROBIC 5CC  Final   Culture  Setup Time   Final    GRAM POSITIVE COCCI IN CLUSTERS AEROBIC BOTTLE ONLY CRITICAL RESULT CALLED TO, READ BACK BY AND VERIFIED WITH: Jerl Santos RN 17:50 12/07/15 ([email or web address])    Culture   Final    STAPHYLOCOCCUS SPECIES (COAGULASE NEGATIVE) THE SIGNIFICANCE OF ISOLATING THIS ORGANISM FROM A SINGLE SET OF BLOOD CULTURES WHEN MULTIPLE SETS ARE DRAWN IS UNCERTAIN. PLEASE NOTIFY THE MICROBIOLOGY DEPARTMENT WITHIN ONE WEEK IF SPECIATION AND SENSITIVITIES ARE REQUIRED.    Report Status PENDING  Incomplete  MRSA PCR Screening     Status: None   Collection Time: 12/06/15 11:47 PM  Result Value Ref Range Status   MRSA by PCR NEGATIVE NEGATIVE Final    Comment:        The GeneXpert MRSA Assay (FDA approved for NASAL specimens only), is one component of a comprehensive MRSA colonization surveillance program. It is not intended to diagnose MRSA infection nor to guide or monitor treatment for MRSA infections.   Culture, Urine     Status: None   Collection Time: 12/07/15  9:45 PM  Result Value Ref Range Status   Specimen Description URINE, CLEAN CATCH  Final   Special Requests NONE  Final   Culture NO GROWTH 1 DAY  Final   Report Status 12/08/2015 FINAL  Final          Studies: Dg Chest 2 View  12/06/2015  CLINICAL DATA:  Altered mental status since dialysis today. EXAM: CHEST  2 VIEW COMPARISON:  Radiographs 01/24/2013. FINDINGS: There are lower lung volumes. The heart size is at the upper limits of normal for AP technique. There is increased vascular congestion with patchy left mid right basilar airspace opacities. There is no consolidation or significant pleural effusion. Degenerative changes are present throughout the thoracic spine. There are postsurgical changes at the right shoulder. IMPRESSION: Patchy left-greater-than-right basilar airspace opacities are associated with lower lung volumes  and may reflect atelectasis. Mild vascular congestion without overt pulmonary edema. Electronically Signed   By: Richardean Sale M.D.   On: 12/06/2015 19:15        Scheduled Meds: . allopurinol  100 mg Oral Daily  . aspirin EC  81 mg Oral Daily  . atenolol  25 mg Oral Once per day on Mon Wed Fri  . atenolol  50 mg Oral Once per day on Sun Tue Thu Sat  . citalopram  10 mg Oral Daily  . docusate sodium  200 mg Oral BID  . feeding supplement (NEPRO CARB STEADY)  237 mL Oral BID BM  . ferric gluconate (FERRLECIT/NULECIT) IV  125 mg Intravenous Q M,W,F-HD  . heparin  5,000 Units Subcutaneous 3 times per day  . multivitamin  1 tablet Oral QHS  . piperacillin-tazobactam (ZOSYN)  IV  2.25 g Intravenous Q8H  . rosuvastatin  20 mg Oral QHS  . sodium chloride flush  3 mL Intravenous Q12H  . vancomycin  750 mg Intravenous Q M,W,F-HD   Continuous Infusions:   Principal Problem:   Sepsis, unspecified organism [facility]) Active Problems:   Essential hypertension   History of GI bleed   ESRD on dialysis ([place])   Sepsis ([place])    Time spent: >30 minutes.    Velvet Bathe, MD, FACP, [facility]. [facility] Pager [phone]  If 7PM-7AM, please contact night-coverage [email or web address] Password [email or web address] 12/08/2015, 5:28 PM    LOS: 2 days

## 2015-12-08 NOTE — Progress Notes (Signed)
[facility] Progress Note  Assessment/Plan: 1. AMS/ Sepsis-source is unclear: Per primary. Now alert, oriented X 3. Afebrile at present. On Vanc/Zosyn, BCs negative, no source of infection identified, WBC 7.0. A & O X 3.  2. Elevated Troponin: per primary. Slight elevation, flat trend. No chest pain. Plan for 2D echo per primary.  3. ESRD - MWF at [facility]. On HD now. K+ 3.9. 4. Hypertension/volume - BP controlled. Takes Atenolol 25 mg PO q HS MWF and Atenolol 50 mg T,Th,S,S. UFG today 1500. Pre wt 66.6 kg.  5. Anemia - HGB 10.2 , down since 01/31. Follow hgb. Getting Fe load (venofer 100 mg IV X 5) for Tsat 16%.  Started today. 6. Metabolic bone disease - Ca 8.4 C Ca 9.36 again today.  Phos 5.4. No binders/Vitamin D listed on medication list  7. Nutrition - Albumin 2.8. Renal diet, renal vit/nepro.     8.Thrombocytopenia: Platelets 160 today. Consistent with in center lab values. Will follow.   Rita H. Brown NP-C 12/08/2015, 9:19 AM  [facility] [phone]  Patient seen and examined, agree with above note with above modifications. Seen on Hd - feels fine- plan is for echo and if that is OK I assume discharge=- pt is ready and willing to be discharged  Corliss Parish, MD 12/08/2015     Subjective: "I'm doing good, I'm acting right!" On HD supine position without C/O SOB/Chest pain. Getting vanc.as ordered.   Objective Filed Vitals:   12/08/15 0700 12/08/15 0715 12/08/15 0730 12/08/15 0800  BP: 121/65 123/59 115/61 123/56  Pulse: 60 57 58 59  Temp:      TempSrc:      Resp: 19 14 16 17   Height:      Weight:      SpO2:       Physical Exam General: Pleasant, well nourished elderly male in NAD Heart: S1, S2, II/VI systolic M. No JVD Lungs: Bilateral breath sounds CTA A/P Abdomen: soft, active BS Extremities: No LE edema Dialysis Access: LFA AVG + thrill + bruit  Dialysis Orders: Center: [facility] on MWF  . 3 hrs 30 min, 180NRe Optiflux, BFR 400, DFR Manual 800 mL/min, EDW 66.5 (kg), Dialysate 2.0 K, 2.0 Ca, UFR Profile: None, Sodium Model: None, Access: LFA AV Graft Heparin: No heparin Venofer: 100 mg IV Q treatment  Additional Objective Labs: Basic Metabolic Panel:  Recent Labs Lab 12/06/15 1954 12/07/15 0325 12/08/15 0709  NA 139 140 137  K 4.3 3.8 3.9  CL 94* 97* 99*  CO2 27 31 26   GLUCOSE 128* 122* 91  BUN 22* 28* 46*  CREATININE 5.93* 6.72* 8.69*  CALCIUM 8.9 8.4* 8.4*  PHOS  --   --  5.4*   Liver Function Tests:  Recent Labs Lab 12/06/15 1954 12/07/15 0325 12/08/15 0709  AST 26 21  --   ALT 14* 15*  --   ALKPHOS 53 46  --   BILITOT 0.8 0.9  --   PROT 6.7 5.4*  --   ALBUMIN 3.7 2.8* 2.8*   No results for input(s): LIPASE, AMYLASE in the last 168 hours. CBC:  Recent Labs Lab 12/06/15 1950 12/06/15 1954 12/08/15 0709  WBC  --  9.3 7.0  NEUTROABS  --  7.8*  --   HGB 13.6 12.2* 10.2*  HCT 40.0 38.1* 31.5*  MCV  --  92.9 91.3  PLT  --  122* 160   Blood Culture    Component Value Date/Time  SDES BLOOD RIGHT HAND 12/06/2015 1435   SPECREQUEST BOTTLES DRAWN AEROBIC AND ANAEROBIC 5CC 12/06/2015 1435   CULT NO GROWTH < 12 HOURS 12/06/2015 1435   REPTSTATUS PENDING 12/06/2015 1435    Cardiac Enzymes:  Recent Labs Lab 12/06/15 2228 12/07/15 0302 12/07/15 0849  TROPONINI 0.11* 0.12* 0.12*   CBG:  Recent Labs Lab 12/07/15 0749  GLUCAP 91   Iron Studies: No results for input(s): IRON, TIBC, TRANSFERRIN, FERRITIN in the last 72 hours. @lablastinr3 @ Studies/Results: Dg Chest 2 View  12/06/2015  CLINICAL DATA:  Altered mental status since dialysis today. EXAM: CHEST  2 VIEW COMPARISON:  Radiographs 01/24/2013. FINDINGS: There are lower lung volumes. The heart size is at the upper limits of normal for AP technique. There is increased vascular congestion with patchy left mid right basilar airspace opacities. There is no consolidation or significant  pleural effusion. Degenerative changes are present throughout the thoracic spine. There are postsurgical changes at the right shoulder. IMPRESSION: Patchy left-greater-than-right basilar airspace opacities are associated with lower lung volumes and may reflect atelectasis. Mild vascular congestion without overt pulmonary edema. Electronically Signed   By: Richardean Sale M.D.   On: 12/06/2015 19:15   Medications:   . allopurinol  100 mg Oral Daily  . aspirin EC  81 mg Oral Daily  . atenolol  25 mg Oral Once per day on Mon Wed Fri  . atenolol  50 mg Oral Once per day on Sun Tue Thu Sat  . citalopram  10 mg Oral Daily  . docusate sodium  200 mg Oral BID  . feeding supplement (NEPRO CARB STEADY)  237 mL Oral BID BM  . ferric gluconate (FERRLECIT/NULECIT) IV  125 mg Intravenous Q M,W,F-HD  . heparin  5,000 Units Subcutaneous 3 times per day  . multivitamin  1 tablet Oral QHS  . piperacillin-tazobactam (ZOSYN)  IV  2.25 g Intravenous Q8H  . rosuvastatin  20 mg Oral QHS  . sodium chloride flush  3 mL Intravenous Q12H  . Vancomycin      . vancomycin  750 mg Intravenous Q M,W,F-HD

## 2015-12-08 NOTE — Procedures (Signed)
Patient was seen on dialysis and the procedure was supervised.  BFR 400  Via AVG BP is  123/56.   Patient appears to be tolerating treatment well  Pheobe Sandiford A 12/08/2015

## 2015-12-09 ENCOUNTER — Inpatient Hospital Stay (HOSPITAL_COMMUNITY): Payer: Medicare Other

## 2015-12-09 DIAGNOSIS — I371 Nonrheumatic pulmonary valve insufficiency: Secondary | ICD-10-CM

## 2015-12-09 DIAGNOSIS — I361 Nonrheumatic tricuspid (valve) insufficiency: Secondary | ICD-10-CM

## 2015-12-09 LAB — CULTURE, BLOOD (ROUTINE X 2)

## 2015-12-09 LAB — GLUCOSE, CAPILLARY: Glucose-Capillary: 102 mg/dL — ABNORMAL HIGH (ref 65–99)

## 2015-12-09 MED ORDER — AMOXICILLIN-POT CLAVULANATE 500-125 MG PO TABS: 1.0000 | ORAL_TABLET | Freq: Every day | ORAL | Status: DC

## 2015-12-09 NOTE — Discharge Summary (Signed)
Physician Discharge Summary  [name] [id number] DOB: [birth year] DOA: 12/06/2015  PCP: [name], MD  Admit date: 12/06/2015 Discharge date: 12/09/2015  Time spent: > 35 minutes  Recommendations for Outpatient Follow-up:  1. Ensure patient continues follow-up with nephrology. 2. Patient improved on Zosyn as such will be discharged on Augmentin to complete a seven-day treatment regimen 3. Patient had an echocardiogram which reported EF of 65-70% with no regional wall motion abnormalities   Discharge Diagnoses:  Principal Problem:   Sepsis, unspecified organism [facility]) Active Problems:   Essential hypertension   History of GI bleed   ESRD on dialysis ([place])   Sepsis [facility])   Discharge Condition: Stable  Diet recommendation: Renal  Filed Weights   12/08/15 0647 12/08/15 1035 12/08/15 1956  Weight: 66.6 kg (146 lb 13.2 oz) 65.3 kg (143 lb 15.4 oz) 65.4 kg (144 lb 2.9 oz)    History of present illness:  Patient is an 82 year old with history of end-stage renal disease on hemodialysis, gout, lower GI hemorrhage. Presented to the hospital complaining of fatigue and altered mental status. Was also found to be febrile in the ED.  Hospital Course:  Fever: Presumably secondary to pneumonia - Improved on Zosyn, will discharge on Augmentin for 4 more days of complete a seven-day treatment course - 1 blood culture was positive but the other negative and based on results most likely contaminant  End-stage renal disease -Patient was dialyzed and seen by nephrology here.  Procedures:  echocardiogram which reported EF of 65-70% with no regional wall motion abnormalities  Consultations:  Nephrology  Discharge Exam: Filed Vitals:   12/09/15 0547 12/09/15 0826  BP: 134/57 133/65  Pulse: 62 59  Temp: 98.5 F (36.9 C) 98.2 F (36.8 C)  Resp: 14 16    General: Patient in no acute distress, alert and awake Cardiovascular: Regular rate and rhythm, no rub Respiratory: No  increased work of breathing no wheezes  Discharge Instructions   Discharge Instructions    Call MD for:  difficulty breathing, headache or visual disturbances    Complete by:  As directed      Call MD for:  severe uncontrolled pain    Complete by:  As directed      Call MD for:  temperature >100.4    Complete by:  As directed      Diet - low sodium heart healthy    Complete by:  As directed      Discharge instructions    Complete by:  As directed   Please continue routine follow-up with your nephrologist for recommendations for your end-stage renal disease.     Increase activity slowly    Complete by:  As directed           Current Discharge Medication List    START taking these medications   Details  amoxicillin-clavulanate (AUGMENTIN) 500-125 MG tablet Take 1 tablet (500 mg total) by mouth daily. Qty: 4 tablet, Refills: 0      CONTINUE these medications which have NOT CHANGED   Details  allopurinol (ZYLOPRIM) 100 MG tablet Take 1 tablet (100 mg total) by mouth daily. Qty: 90 tablet, Refills: 3    aspirin 81 MG tablet Take 1 tablet (81 mg total) by mouth daily.    atenolol (TENORMIN) 50 MG tablet Take 50 mg by mouth. 1 tab by mouth daily except for HALF on Mon-Wed-Fri Refills: 0    citalopram (CELEXA) 10 MG tablet Take 1 tablet (10 mg total) by mouth  daily. Qty: 10 tablet, Refills: 0    docusate sodium (COLACE) 100 MG capsule Take 2 capsules (200 mg total) by mouth 2 (two) times daily. Qty: 60 capsule, Refills: 0    doxercalciferol (HECTOROL) 0.5 MCG capsule Take 1.5 mcg by mouth 3 (three) times a week. MWF    rosuvastatin (CRESTOR) 20 MG tablet Take 1 tablet (20 mg total) by mouth at bedtime. Qty: 90 tablet, Refills: 3    Specialty Vitamins Products (ONE-A-DAY ENERGY FORMULA PO) Take 1 capsule by mouth daily.    Nutritional Supplements (FEEDING SUPPLEMENT, NEPRO CARB STEADY,) LIQD Take 237 mLs by mouth as needed (missed meal during dialysis.). Qty: 30 Can,  Refills: 0       No Known Allergies    The results of significant diagnostics from this hospitalization (including imaging, microbiology, ancillary and laboratory) are listed below for reference.    Significant Diagnostic Studies: Dg Chest 2 View  12/06/2015  CLINICAL DATA:  Altered mental status since dialysis today. EXAM: CHEST  2 VIEW COMPARISON:  Radiographs 01/24/2013. FINDINGS: There are lower lung volumes. The heart size is at the upper limits of normal for AP technique. There is increased vascular congestion with patchy left mid right basilar airspace opacities. There is no consolidation or significant pleural effusion. Degenerative changes are present throughout the thoracic spine. There are postsurgical changes at the right shoulder. IMPRESSION: Patchy left-greater-than-right basilar airspace opacities are associated with lower lung volumes and may reflect atelectasis. Mild vascular congestion without overt pulmonary edema. Electronically Signed   By: Richardean Sale M.D.   On: 12/06/2015 19:15    Microbiology: Recent Results (from the past 240 hour(s))  Blood Culture (routine x 2)     Status: None (Preliminary result)   Collection Time: 12/06/15  2:30 PM  Result Value Ref Range Status   Specimen Description BLOOD RIGHT ARM  Final   Special Requests BOTTLES DRAWN AEROBIC AND ANAEROBIC 5CC  Final   Culture NO GROWTH 3 DAYS  Final   Report Status PENDING  Incomplete  Blood Culture (routine x 2)     Status: None   Collection Time: 12/06/15  2:35 PM  Result Value Ref Range Status   Specimen Description BLOOD RIGHT HAND  Final   Special Requests BOTTLES DRAWN AEROBIC AND ANAEROBIC 5CC  Final   Culture  Setup Time   Final    GRAM POSITIVE COCCI IN CLUSTERS AEROBIC BOTTLE ONLY CRITICAL RESULT CALLED TO, READ BACK BY AND VERIFIED WITH: Jerl Santos RN 17:50 12/07/15 ([email or web address])    Culture   Final    STAPHYLOCOCCUS SPECIES (COAGULASE NEGATIVE) THE SIGNIFICANCE OF ISOLATING THIS  ORGANISM FROM A SINGLE SET OF BLOOD CULTURES WHEN MULTIPLE SETS ARE DRAWN IS UNCERTAIN. PLEASE NOTIFY THE MICROBIOLOGY DEPARTMENT WITHIN ONE WEEK IF SPECIATION AND SENSITIVITIES ARE REQUIRED.    Report Status 12/09/2015 FINAL  Final  MRSA PCR Screening     Status: None   Collection Time: 12/06/15 11:47 PM  Result Value Ref Range Status   MRSA by PCR NEGATIVE NEGATIVE Final    Comment:        The GeneXpert MRSA Assay (FDA approved for NASAL specimens only), is one component of a comprehensive MRSA colonization surveillance program. It is not intended to diagnose MRSA infection nor to guide or monitor treatment for MRSA infections.   Culture, Urine     Status: None   Collection Time: 12/07/15  9:45 PM  Result Value Ref Range Status   Specimen Description URINE, CLEAN CATCH  Final   Special Requests NONE  Final   Culture NO GROWTH 1 DAY  Final   Report Status 12/08/2015 FINAL  Final     Labs: Basic Metabolic Panel:  Recent Labs Lab 12/06/15 1950 12/06/15 1954 12/07/15 0325 12/08/15 0709  NA 136 139 140 137  K 4.0 4.3 3.8 3.9  CL 94* 94* 97* 99*  CO2  --  27 31 26   GLUCOSE 125* 128* 122* 91  BUN 29* 22* 28* 46*  CREATININE 5.70* 5.93* 6.72* 8.69*  CALCIUM  --  8.9 8.4* 8.4*  PHOS  --   --   --  5.4*   Liver Function Tests:  Recent Labs Lab 12/06/15 1954 12/07/15 0325 12/08/15 0709  AST 26 21  --   ALT 14* 15*  --   ALKPHOS 53 46  --   BILITOT 0.8 0.9  --   PROT 6.7 5.4*  --   ALBUMIN 3.7 2.8* 2.8*   No results for input(s): LIPASE, AMYLASE in the last 168 hours. No results for input(s): AMMONIA in the last 168 hours. CBC:  Recent Labs Lab 12/06/15 1950 12/06/15 1954 12/08/15 0709  WBC  --  9.3 7.0  NEUTROABS  --  7.8*  --   HGB 13.6 12.2* 10.2*  HCT 40.0 38.1* 31.5*  MCV  --  92.9 91.3  PLT  --  122* 160   Cardiac Enzymes:  Recent Labs Lab 12/06/15 2228 12/07/15 0302 12/07/15 0849  TROPONINI 0.11* 0.12* 0.12*   BNP: BNP (last 3  results) No results for input(s): BNP in the last 8760 hours.  ProBNP (last 3 results) No results for input(s): PROBNP in the last 8760 hours.  CBG:  Recent Labs Lab 12/07/15 0749 12/09/15 0824  GLUCAP 91 102*    Signed:  Velvet Bathe MD.  [facility] 12/09/2015, 3:11 PM

## 2015-12-09 NOTE — Progress Notes (Signed)
12/09/2015 3:41 PM  Reviewed discharge instructions with patient and his wife, including diet, medications, follow-up appts. Etc.  Pt and wife asked questions appropriately and verbalized understanding of instructions upon completion.  IV removed, telemetry removed.  Wife currently getting patient dressed.  To DC with wife.    Princella Pellegrini

## 2015-12-09 NOTE — Care Management Important Message (Signed)
Important Message  Patient Details  Name: [name] MRN: [id number] Date of Birth: [birth year]   Medicare Important Message Given:  Yes    Bentli Llorente P Iesha Summerhill 12/09/2015, 1:12 PM

## 2015-12-09 NOTE — Progress Notes (Signed)
[facility] Progress Note  Assessment/Plan: 1. AMS/ Sepsis-source is unclear? PNA Antiiotics narrowed Zosyn, BCs  1/2 + coag neg staph -prob contaminate- primary stopped Vanc-MS at baseline 2. Elevated Troponin: per primary. Slight elevation, flat trend. No chest pain. Plan for 2D echo per primary.  3. ESRD - MWF - next HD Friday if not d/c today 4. Hypertension/volume - BP controlled with med and UF - net UF 1 L on Wed with post weight 65.3  5. Anemia - HGB 10.2 , down since 01/31. Follow hgb. Getting Fe load (venofer 100 mg IV X 5) for Tsat 16%.- if Hgb still down on Friday, need to resume ESA -would given Aranesp 60 6. Metabolic bone disease - Ca 8.4 C Ca 9.4 Phos 5.4. No binders/Vitamin D listed on medication list - iPTH stable in 200s  7. Nutrition - Albumin 2.8. Renal diet, renal vit/nepro.   8.  Thrombocytopenia:improved at [place], [place] [phone] 12/09/2015,8:16 AM  LOS: 3 days    Patient seen and examined, agree with above note with above modifications. Is doing fine- awaiting echo to be done- tolerated HD yest- hopefully will be able to go home later if echo is unchanged on PO abx ?  Will do HD tomorrow if still here  Corliss Parish, MD 12/09/2015     Subjective:   "going down for a test later today" feels good- doing fine  Objective Filed Vitals:   12/08/15 1130 12/08/15 1721 12/08/15 1956 12/09/15 0547  BP: 134/62 117/51 123/63 134/57  Pulse: 66 68 65 62  Temp: 98 F (36.7 C) 98.2 F (36.8 C) 98.4 F (36.9 C) 98.5 F (36.9 C)  TempSrc: Oral Oral Oral Oral  Resp: 18 18 16 14   Height:      Weight:   65.4 kg (144 lb 2.9 oz)   SpO2: 96% 98% 100% 100%   Physical Exam General: NAD eating breakfast Heart: RRR 2/6 murmur Lungs: dim BS grossly clear Abdomen: soft NT Extremities: no LE edema Dialysis Access: left lower AVGG + bruit  Dialysis Orders: Center: [facility] on MWF  . 3 hrs 30 min, 180NRe Optiflux, BFR 400, DFR Manual 800 mL/min, EDW 66.5 (kg), Dialysate 2.0 K, 2.0 Ca, UFR Profile: None, Sodium Model: None, Access: LFA AV Graft Heparin: No heparin Venofer: 100 mg IV Q treatment Last mircera dose 75 on 1/18 was getting q two weeks- had been held for hgb of 11.6 on 1/25  Additional Objective Labs: Basic Metabolic Panel:  Recent Labs Lab 12/06/15 1954 12/07/15 0325 12/08/15 0709  NA 139 140 137  K 4.3 3.8 3.9  CL 94* 97* 99*  CO2 27 31 26   GLUCOSE 128* 122* 91  BUN 22* 28* 46*  CREATININE 5.93* 6.72* 8.69*  CALCIUM 8.9 8.4* 8.4*  PHOS  --   --  5.4*   Liver Function Tests:  Recent Labs Lab 12/06/15 1954 12/07/15 0325 12/08/15 0709  AST 26 21  --   ALT 14* 15*  --   ALKPHOS 53 46  --   BILITOT 0.8 0.9  --   PROT 6.7 5.4*  --   ALBUMIN 3.7 2.8* 2.8*   CBC:  Recent Labs Lab 12/06/15 1950 12/06/15 1954 12/08/15 0709  WBC  --  9.3 7.0  NEUTROABS  --  7.8*  --   HGB 13.6 12.2* 10.2*  HCT 40.0 38.1* 31.5*  MCV  --  92.9 91.3  PLT  --  122* 160  Blood Culture    Component Value Date/Time   SDES URINE, CLEAN CATCH 12/07/2015 2145   [place] NONE 12/07/2015 2145   CULT NO GROWTH 1 DAY 12/07/2015 2145   REPTSTATUS 12/08/2015 FINAL 12/07/2015 2145    Cardiac Enzymes:  Recent Labs Lab 12/06/15 2228 12/07/15 0302 12/07/15 0849  TROPONINI 0.11* 0.12* 0.12*   CBG:  Recent Labs Lab 12/07/15 0749  GLUCAP 91   Medications:   . allopurinol  100 mg Oral Daily  . aspirin EC  81 mg Oral Daily  . atenolol  25 mg Oral Once per day on Mon Wed Fri  . atenolol  50 mg Oral Once per day on Sun Tue Thu Sat  . citalopram  10 mg Oral Daily  . docusate sodium  200 mg Oral BID  . feeding supplement (NEPRO CARB STEADY)  237 mL Oral BID BM  . ferric gluconate (FERRLECIT/NULECIT) IV  125 mg Intravenous Q M,W,F-HD  . heparin  5,000 Units Subcutaneous 3 times per day  . multivitamin  1 tablet Oral QHS  .  piperacillin-tazobactam (ZOSYN)  IV  2.25 g Intravenous Q8H  . rosuvastatin  20 mg Oral QHS  . sodium chloride flush  3 mL Intravenous Q12H

## 2015-12-09 NOTE — Progress Notes (Signed)
  Echocardiogram 2D Echocardiogram has been performed.  Diamond Nickel 12/09/2015, 12:44 PM

## 2015-12-10 DIAGNOSIS — N186 End stage renal disease: Secondary | ICD-10-CM | POA: Diagnosis not present

## 2015-12-10 DIAGNOSIS — D631 Anemia in chronic kidney disease: Secondary | ICD-10-CM | POA: Diagnosis not present

## 2015-12-10 DIAGNOSIS — D509 Iron deficiency anemia, unspecified: Secondary | ICD-10-CM | POA: Diagnosis not present

## 2015-12-11 LAB — CULTURE, BLOOD (ROUTINE X 2): Culture: NO GROWTH

## 2015-12-13 DIAGNOSIS — N186 End stage renal disease: Secondary | ICD-10-CM | POA: Diagnosis not present

## 2015-12-13 DIAGNOSIS — D631 Anemia in chronic kidney disease: Secondary | ICD-10-CM | POA: Diagnosis not present

## 2015-12-13 DIAGNOSIS — D509 Iron deficiency anemia, unspecified: Secondary | ICD-10-CM | POA: Diagnosis not present

## 2015-12-15 DIAGNOSIS — D509 Iron deficiency anemia, unspecified: Secondary | ICD-10-CM | POA: Diagnosis not present

## 2015-12-15 DIAGNOSIS — D631 Anemia in chronic kidney disease: Secondary | ICD-10-CM | POA: Diagnosis not present

## 2015-12-15 DIAGNOSIS — N186 End stage renal disease: Secondary | ICD-10-CM | POA: Diagnosis not present

## 2015-12-16 DIAGNOSIS — L602 Onychogryphosis: Secondary | ICD-10-CM | POA: Diagnosis not present

## 2015-12-16 DIAGNOSIS — M79675 Pain in left toe(s): Secondary | ICD-10-CM | POA: Diagnosis not present

## 2015-12-17 DIAGNOSIS — D509 Iron deficiency anemia, unspecified: Secondary | ICD-10-CM | POA: Diagnosis not present

## 2015-12-17 DIAGNOSIS — D631 Anemia in chronic kidney disease: Secondary | ICD-10-CM | POA: Diagnosis not present

## 2015-12-17 DIAGNOSIS — N186 End stage renal disease: Secondary | ICD-10-CM | POA: Diagnosis not present

## 2015-12-20 DIAGNOSIS — D631 Anemia in chronic kidney disease: Secondary | ICD-10-CM | POA: Diagnosis not present

## 2015-12-20 DIAGNOSIS — N186 End stage renal disease: Secondary | ICD-10-CM | POA: Diagnosis not present

## 2015-12-20 DIAGNOSIS — D509 Iron deficiency anemia, unspecified: Secondary | ICD-10-CM | POA: Diagnosis not present

## 2015-12-21 DIAGNOSIS — N186 End stage renal disease: Secondary | ICD-10-CM | POA: Diagnosis not present

## 2015-12-21 DIAGNOSIS — Z992 Dependence on renal dialysis: Secondary | ICD-10-CM | POA: Diagnosis not present

## 2015-12-21 DIAGNOSIS — T82858D Stenosis of vascular prosthetic devices, implants and grafts, subsequent encounter: Secondary | ICD-10-CM | POA: Diagnosis not present

## 2015-12-21 DIAGNOSIS — I871 Compression of vein: Secondary | ICD-10-CM | POA: Diagnosis not present

## 2015-12-22 DIAGNOSIS — D631 Anemia in chronic kidney disease: Secondary | ICD-10-CM | POA: Diagnosis not present

## 2015-12-22 DIAGNOSIS — D509 Iron deficiency anemia, unspecified: Secondary | ICD-10-CM | POA: Diagnosis not present

## 2015-12-22 DIAGNOSIS — N186 End stage renal disease: Secondary | ICD-10-CM | POA: Diagnosis not present

## 2015-12-24 DIAGNOSIS — D631 Anemia in chronic kidney disease: Secondary | ICD-10-CM | POA: Diagnosis not present

## 2015-12-24 DIAGNOSIS — D509 Iron deficiency anemia, unspecified: Secondary | ICD-10-CM | POA: Diagnosis not present

## 2015-12-24 DIAGNOSIS — N186 End stage renal disease: Secondary | ICD-10-CM | POA: Diagnosis not present

## 2015-12-27 DIAGNOSIS — D509 Iron deficiency anemia, unspecified: Secondary | ICD-10-CM | POA: Diagnosis not present

## 2015-12-27 DIAGNOSIS — D631 Anemia in chronic kidney disease: Secondary | ICD-10-CM | POA: Diagnosis not present

## 2015-12-27 DIAGNOSIS — N186 End stage renal disease: Secondary | ICD-10-CM | POA: Diagnosis not present

## 2015-12-29 DIAGNOSIS — D631 Anemia in chronic kidney disease: Secondary | ICD-10-CM | POA: Diagnosis not present

## 2015-12-29 DIAGNOSIS — D509 Iron deficiency anemia, unspecified: Secondary | ICD-10-CM | POA: Diagnosis not present

## 2015-12-29 DIAGNOSIS — N186 End stage renal disease: Secondary | ICD-10-CM | POA: Diagnosis not present

## 2015-12-31 DIAGNOSIS — D509 Iron deficiency anemia, unspecified: Secondary | ICD-10-CM | POA: Diagnosis not present

## 2015-12-31 DIAGNOSIS — N186 End stage renal disease: Secondary | ICD-10-CM | POA: Diagnosis not present

## 2015-12-31 DIAGNOSIS — D631 Anemia in chronic kidney disease: Secondary | ICD-10-CM | POA: Diagnosis not present

## 2016-01-03 DIAGNOSIS — D509 Iron deficiency anemia, unspecified: Secondary | ICD-10-CM | POA: Diagnosis not present

## 2016-01-03 DIAGNOSIS — N186 End stage renal disease: Secondary | ICD-10-CM | POA: Diagnosis not present

## 2016-01-03 DIAGNOSIS — D631 Anemia in chronic kidney disease: Secondary | ICD-10-CM | POA: Diagnosis not present

## 2016-01-04 DIAGNOSIS — N186 End stage renal disease: Secondary | ICD-10-CM | POA: Diagnosis not present

## 2016-01-04 DIAGNOSIS — Z992 Dependence on renal dialysis: Secondary | ICD-10-CM | POA: Diagnosis not present

## 2016-01-04 DIAGNOSIS — I158 Other secondary hypertension: Secondary | ICD-10-CM | POA: Diagnosis not present

## 2016-01-05 DIAGNOSIS — N186 End stage renal disease: Secondary | ICD-10-CM | POA: Diagnosis not present

## 2016-01-05 DIAGNOSIS — N2581 Secondary hyperparathyroidism of renal origin: Secondary | ICD-10-CM | POA: Diagnosis not present

## 2016-01-05 DIAGNOSIS — D509 Iron deficiency anemia, unspecified: Secondary | ICD-10-CM | POA: Diagnosis not present

## 2016-01-06 ENCOUNTER — Telehealth: Payer: Self-pay | Admitting: *Deleted

## 2016-01-06 DIAGNOSIS — L84 Corns and callosities: Secondary | ICD-10-CM | POA: Diagnosis not present

## 2016-01-06 DIAGNOSIS — M2041 Other hammer toe(s) (acquired), right foot: Secondary | ICD-10-CM | POA: Diagnosis not present

## 2016-01-06 MED ORDER — ROSUVASTATIN CALCIUM 20 MG PO TABS: 20.0000 mg | ORAL_TABLET | Freq: Every day | ORAL | Status: DC

## 2016-01-06 NOTE — Telephone Encounter (Signed)
Received call from pt wife stating he was needing refill on his crestor. [facility] Inform wife will send electronically...[name]

## 2016-01-07 DIAGNOSIS — N186 End stage renal disease: Secondary | ICD-10-CM | POA: Diagnosis not present

## 2016-01-07 DIAGNOSIS — D509 Iron deficiency anemia, unspecified: Secondary | ICD-10-CM | POA: Diagnosis not present

## 2016-01-07 DIAGNOSIS — N2581 Secondary hyperparathyroidism of renal origin: Secondary | ICD-10-CM | POA: Diagnosis not present

## 2016-01-10 DIAGNOSIS — N186 End stage renal disease: Secondary | ICD-10-CM | POA: Diagnosis not present

## 2016-01-10 DIAGNOSIS — N2581 Secondary hyperparathyroidism of renal origin: Secondary | ICD-10-CM | POA: Diagnosis not present

## 2016-01-10 DIAGNOSIS — D509 Iron deficiency anemia, unspecified: Secondary | ICD-10-CM | POA: Diagnosis not present

## 2016-01-12 DIAGNOSIS — N2581 Secondary hyperparathyroidism of renal origin: Secondary | ICD-10-CM | POA: Diagnosis not present

## 2016-01-12 DIAGNOSIS — D509 Iron deficiency anemia, unspecified: Secondary | ICD-10-CM | POA: Diagnosis not present

## 2016-01-12 DIAGNOSIS — N186 End stage renal disease: Secondary | ICD-10-CM | POA: Diagnosis not present

## 2016-01-14 DIAGNOSIS — N186 End stage renal disease: Secondary | ICD-10-CM | POA: Diagnosis not present

## 2016-01-14 DIAGNOSIS — N2581 Secondary hyperparathyroidism of renal origin: Secondary | ICD-10-CM | POA: Diagnosis not present

## 2016-01-14 DIAGNOSIS — D509 Iron deficiency anemia, unspecified: Secondary | ICD-10-CM | POA: Diagnosis not present

## 2016-01-17 ENCOUNTER — Other Ambulatory Visit: Payer: Self-pay | Admitting: Internal Medicine

## 2016-01-17 DIAGNOSIS — N186 End stage renal disease: Secondary | ICD-10-CM | POA: Diagnosis not present

## 2016-01-17 DIAGNOSIS — D509 Iron deficiency anemia, unspecified: Secondary | ICD-10-CM | POA: Diagnosis not present

## 2016-01-17 DIAGNOSIS — N2581 Secondary hyperparathyroidism of renal origin: Secondary | ICD-10-CM | POA: Diagnosis not present

## 2016-01-17 MED ORDER — ROSUVASTATIN CALCIUM 20 MG PO TABS: 20.0000 mg | ORAL_TABLET | Freq: Every day | ORAL | Status: DC

## 2016-01-17 NOTE — Telephone Encounter (Signed)
States mail order did not receive script for crestor.  Please resend

## 2016-01-17 NOTE — Telephone Encounter (Signed)
erx resent 

## 2016-01-19 DIAGNOSIS — D509 Iron deficiency anemia, unspecified: Secondary | ICD-10-CM | POA: Diagnosis not present

## 2016-01-19 DIAGNOSIS — N2581 Secondary hyperparathyroidism of renal origin: Secondary | ICD-10-CM | POA: Diagnosis not present

## 2016-01-19 DIAGNOSIS — N186 End stage renal disease: Secondary | ICD-10-CM | POA: Diagnosis not present

## 2016-01-20 DIAGNOSIS — M2041 Other hammer toe(s) (acquired), right foot: Secondary | ICD-10-CM | POA: Diagnosis not present

## 2016-01-20 DIAGNOSIS — L84 Corns and callosities: Secondary | ICD-10-CM | POA: Diagnosis not present

## 2016-01-21 DIAGNOSIS — D509 Iron deficiency anemia, unspecified: Secondary | ICD-10-CM | POA: Diagnosis not present

## 2016-01-21 DIAGNOSIS — N186 End stage renal disease: Secondary | ICD-10-CM | POA: Diagnosis not present

## 2016-01-21 DIAGNOSIS — N2581 Secondary hyperparathyroidism of renal origin: Secondary | ICD-10-CM | POA: Diagnosis not present

## 2016-01-24 DIAGNOSIS — N186 End stage renal disease: Secondary | ICD-10-CM | POA: Diagnosis not present

## 2016-01-24 DIAGNOSIS — D509 Iron deficiency anemia, unspecified: Secondary | ICD-10-CM | POA: Diagnosis not present

## 2016-01-24 DIAGNOSIS — N2581 Secondary hyperparathyroidism of renal origin: Secondary | ICD-10-CM | POA: Diagnosis not present

## 2016-01-26 DIAGNOSIS — N2581 Secondary hyperparathyroidism of renal origin: Secondary | ICD-10-CM | POA: Diagnosis not present

## 2016-01-26 DIAGNOSIS — D509 Iron deficiency anemia, unspecified: Secondary | ICD-10-CM | POA: Diagnosis not present

## 2016-01-26 DIAGNOSIS — N186 End stage renal disease: Secondary | ICD-10-CM | POA: Diagnosis not present

## 2016-01-28 DIAGNOSIS — N186 End stage renal disease: Secondary | ICD-10-CM | POA: Diagnosis not present

## 2016-01-28 DIAGNOSIS — D509 Iron deficiency anemia, unspecified: Secondary | ICD-10-CM | POA: Diagnosis not present

## 2016-01-28 DIAGNOSIS — N2581 Secondary hyperparathyroidism of renal origin: Secondary | ICD-10-CM | POA: Diagnosis not present

## 2016-01-31 DIAGNOSIS — N186 End stage renal disease: Secondary | ICD-10-CM | POA: Diagnosis not present

## 2016-01-31 DIAGNOSIS — N2581 Secondary hyperparathyroidism of renal origin: Secondary | ICD-10-CM | POA: Diagnosis not present

## 2016-01-31 DIAGNOSIS — D509 Iron deficiency anemia, unspecified: Secondary | ICD-10-CM | POA: Diagnosis not present

## 2016-02-02 DIAGNOSIS — N186 End stage renal disease: Secondary | ICD-10-CM | POA: Diagnosis not present

## 2016-02-02 DIAGNOSIS — N2581 Secondary hyperparathyroidism of renal origin: Secondary | ICD-10-CM | POA: Diagnosis not present

## 2016-02-02 DIAGNOSIS — D509 Iron deficiency anemia, unspecified: Secondary | ICD-10-CM | POA: Diagnosis not present

## 2016-02-04 DIAGNOSIS — Z992 Dependence on renal dialysis: Secondary | ICD-10-CM | POA: Diagnosis not present

## 2016-02-04 DIAGNOSIS — D509 Iron deficiency anemia, unspecified: Secondary | ICD-10-CM | POA: Diagnosis not present

## 2016-02-04 DIAGNOSIS — N2581 Secondary hyperparathyroidism of renal origin: Secondary | ICD-10-CM | POA: Diagnosis not present

## 2016-02-04 DIAGNOSIS — N186 End stage renal disease: Secondary | ICD-10-CM | POA: Diagnosis not present

## 2016-02-04 DIAGNOSIS — I158 Other secondary hypertension: Secondary | ICD-10-CM | POA: Diagnosis not present

## 2016-02-07 DIAGNOSIS — N2581 Secondary hyperparathyroidism of renal origin: Secondary | ICD-10-CM | POA: Diagnosis not present

## 2016-02-07 DIAGNOSIS — N186 End stage renal disease: Secondary | ICD-10-CM | POA: Diagnosis not present

## 2016-02-09 DIAGNOSIS — N2581 Secondary hyperparathyroidism of renal origin: Secondary | ICD-10-CM | POA: Diagnosis not present

## 2016-02-09 DIAGNOSIS — N186 End stage renal disease: Secondary | ICD-10-CM | POA: Diagnosis not present

## 2016-02-11 DIAGNOSIS — N186 End stage renal disease: Secondary | ICD-10-CM | POA: Diagnosis not present

## 2016-02-11 DIAGNOSIS — N2581 Secondary hyperparathyroidism of renal origin: Secondary | ICD-10-CM | POA: Diagnosis not present

## 2016-02-14 DIAGNOSIS — N2581 Secondary hyperparathyroidism of renal origin: Secondary | ICD-10-CM | POA: Diagnosis not present

## 2016-02-14 DIAGNOSIS — N186 End stage renal disease: Secondary | ICD-10-CM | POA: Diagnosis not present

## 2016-02-16 DIAGNOSIS — N2581 Secondary hyperparathyroidism of renal origin: Secondary | ICD-10-CM | POA: Diagnosis not present

## 2016-02-16 DIAGNOSIS — N186 End stage renal disease: Secondary | ICD-10-CM | POA: Diagnosis not present

## 2016-02-17 DIAGNOSIS — M2041 Other hammer toe(s) (acquired), right foot: Secondary | ICD-10-CM | POA: Diagnosis not present

## 2016-02-17 DIAGNOSIS — L84 Corns and callosities: Secondary | ICD-10-CM | POA: Diagnosis not present

## 2016-02-18 DIAGNOSIS — N2581 Secondary hyperparathyroidism of renal origin: Secondary | ICD-10-CM | POA: Diagnosis not present

## 2016-02-18 DIAGNOSIS — N186 End stage renal disease: Secondary | ICD-10-CM | POA: Diagnosis not present

## 2016-02-21 DIAGNOSIS — N186 End stage renal disease: Secondary | ICD-10-CM | POA: Diagnosis not present

## 2016-02-21 DIAGNOSIS — N2581 Secondary hyperparathyroidism of renal origin: Secondary | ICD-10-CM | POA: Diagnosis not present

## 2016-02-23 DIAGNOSIS — N2581 Secondary hyperparathyroidism of renal origin: Secondary | ICD-10-CM | POA: Diagnosis not present

## 2016-02-23 DIAGNOSIS — N186 End stage renal disease: Secondary | ICD-10-CM | POA: Diagnosis not present

## 2016-02-25 DIAGNOSIS — N186 End stage renal disease: Secondary | ICD-10-CM | POA: Diagnosis not present

## 2016-02-25 DIAGNOSIS — N2581 Secondary hyperparathyroidism of renal origin: Secondary | ICD-10-CM | POA: Diagnosis not present

## 2016-02-28 ENCOUNTER — Emergency Department (HOSPITAL_COMMUNITY)
Admission: EM | Admit: 2016-02-28 | Discharge: 2016-02-29 | Disposition: A | Discharge status: Home / Self Care | Payer: Medicare Other | Attending: Emergency Medicine | Admitting: Emergency Medicine

## 2016-02-28 ENCOUNTER — Encounter (HOSPITAL_COMMUNITY): Payer: Self-pay | Admitting: Emergency Medicine

## 2016-02-28 DIAGNOSIS — M1990 Unspecified osteoarthritis, unspecified site: Secondary | ICD-10-CM | POA: Insufficient documentation

## 2016-02-28 DIAGNOSIS — Z862 Personal history of diseases of the blood and blood-forming organs and certain disorders involving the immune mechanism: Secondary | ICD-10-CM | POA: Insufficient documentation

## 2016-02-28 DIAGNOSIS — Z8673 Personal history of transient ischemic attack (TIA), and cerebral infarction without residual deficits: Secondary | ICD-10-CM | POA: Insufficient documentation

## 2016-02-28 DIAGNOSIS — R55 Syncope and collapse: Secondary | ICD-10-CM | POA: Insufficient documentation

## 2016-02-28 DIAGNOSIS — E785 Hyperlipidemia, unspecified: Secondary | ICD-10-CM | POA: Diagnosis not present

## 2016-02-28 DIAGNOSIS — I120 Hypertensive chronic kidney disease with stage 5 chronic kidney disease or end stage renal disease: Secondary | ICD-10-CM | POA: Diagnosis not present

## 2016-02-28 DIAGNOSIS — F329 Major depressive disorder, single episode, unspecified: Secondary | ICD-10-CM | POA: Insufficient documentation

## 2016-02-28 DIAGNOSIS — M109 Gout, unspecified: Secondary | ICD-10-CM | POA: Diagnosis not present

## 2016-02-28 DIAGNOSIS — R011 Cardiac murmur, unspecified: Secondary | ICD-10-CM | POA: Insufficient documentation

## 2016-02-28 DIAGNOSIS — Q6100 Congenital renal cyst, unspecified: Secondary | ICD-10-CM | POA: Diagnosis not present

## 2016-02-28 DIAGNOSIS — Z8546 Personal history of malignant neoplasm of prostate: Secondary | ICD-10-CM | POA: Insufficient documentation

## 2016-02-28 DIAGNOSIS — Z8719 Personal history of other diseases of the digestive system: Secondary | ICD-10-CM | POA: Insufficient documentation

## 2016-02-28 DIAGNOSIS — N186 End stage renal disease: Secondary | ICD-10-CM | POA: Diagnosis not present

## 2016-02-28 DIAGNOSIS — R42 Dizziness and giddiness: Secondary | ICD-10-CM | POA: Insufficient documentation

## 2016-02-28 DIAGNOSIS — R404 Transient alteration of awareness: Secondary | ICD-10-CM | POA: Diagnosis not present

## 2016-02-28 DIAGNOSIS — N2581 Secondary hyperparathyroidism of renal origin: Secondary | ICD-10-CM | POA: Diagnosis not present

## 2016-02-28 MED ORDER — SODIUM CHLORIDE 0.9 % IV BOLUS (SEPSIS)
1000.0000 mL | Freq: Once | INTRAVENOUS | Status: AC
  Administered 2016-02-28: 1000 mL via INTRAVENOUS

## 2016-02-28 NOTE — ED Notes (Addendum)
Pt had near syncopal episode after getting out of the shower this evening. Pt sat down on the bed and felt better after rest. Pt arrived to the ER with no complaints at this time. Pt is dialysis pt that was at dialysis today.

## 2016-02-28 NOTE — ED Provider Notes (Signed)
CSN: [id number]     Arrival date & time 02/28/16  2225 History  By signing my name below, I, Evelene Croon, attest that this documentation has been prepared under the direction and in the presence of Varney Biles, MD . Electronically Signed: Evelene Croon, Scribe. 02/28/2016. 2:31 AM.    Chief Complaint  Patient presents with  . Near Syncope    The history is provided by the patient, the spouse and medical records. No language interpreter was used.     HPI Comments:  [name] is a 82 y.o. male with a history of HTN, ESRD, who presents to the Emergency Department complaining of dizziness which began while getting out of the shower this evening. Pt states he felt like he was losing his balance. Wife states she heard a thud on the bathroom wall and walked in on the pt sliding to the ground. She states she tried to get his attention and he would not respond; she believes he lost consciousness. She was able to get him to the bed where he began to feel better.  Pt states he remembers the majority of what happened PTA but is not sure if lost consciousness. He denies CP, SOB, diaphoresis, facial droop, numbness/tingling/weakness, blood in his stool, and palpitations. No alleviating factors noted.  Pt reports h/o similar episode where he lost consciousness ~9 months ago; shortly afterward he was diagnosed with a TIA.  He has had 2 unconcerning echocardiograms in the last year. Pt receives dialysis M/W/F; he was last dialyzed today. He denies use of anti-coagulants other than daily ASA.   PCP- [name] Maryanna Shape)  Past Medical History  Diagnosis Date  . ANEMIA-IRON DEFICIENCY 03/09/2008  . DIVERTICULOSIS, COLON 03/09/2008  . GOUT 03/09/2008  . HYPERLIPIDEMIA 03/09/2008  . HYPERTENSION 03/09/2008  . PROSTATE CANCER, HX OF 03/09/2008  . ESRD (end stage renal disease) ([place]) 03/09/2008  . Complex renal cyst 06/19/2011  . Hyperparathyroidism ([place])   . Prostate cancer [facility]) 2002    Completed external beam  radiation 2003.per HPI  . Arthritis     cervical spine.   Marland Kitchen History of blood transfusion   . Depression 03/26/2014  . Hemorrhoids 08/2009    internal.   . Radiation cystitis 2010.  Marland Kitchen Stroke [facility])    Past Surgical History  Procedure Laterality Date  . Tonsillectomy    . Rotator cuff repair right  4/08  . S/p left hip replacement  2007    Dr. Percell Miller ortho  . Joint replacement      HIP  . Av fistula placement Left 01/24/2013    Procedure: INSERTION OF ARTERIOVENOUS (AV) GORE-TEX GRAFT ARM;  Surgeon: Rosetta Posner, MD;  Location: [place];  Service: Vascular;  Laterality: Left;  . Flexible sigmoidoscopy N/A 06/15/2015    Procedure: FLEXIBLE SIGMOIDOSCOPY;  Surgeon: Gatha Mayer, MD;  Location: [place];  Service: Endoscopy;  Laterality: N/A;  . Colonoscopy N/A 06/17/2015    Procedure: COLONOSCOPY;  Surgeon: Gatha Mayer, MD;  Location: [place];  Service: Endoscopy;  Laterality: N/A;  . Esophagogastroduodenoscopy N/A 06/17/2015    Procedure: ESOPHAGOGASTRODUODENOSCOPY (EGD);  Surgeon: Gatha Mayer, MD;  Location: [facility] ENDOSCOPY;  Service: Endoscopy;  Laterality: N/A;  . Givens capsule study N/A 07/06/2015    Procedure: GIVENS CAPSULE STUDY;  Surgeon: Milus Banister, MD;  Location: [place];  Service: Endoscopy;  Laterality: N/A;   Family History  Problem Relation Age of Onset  . Hypertension Father   . Diabetes  Father   . Cancer Father     prostate cancer  . Hypertension Brother   . Cancer Brother    Social History  Substance Use Topics  . Smoking status: Former Smoker -- 5 years    Types: Cigarettes    Quit date: 09/17/1982  . Smokeless tobacco: Never Used  . Alcohol Use: No     Comment: rare    Review of Systems  10 systems reviewed and all are negative for acute change except as noted in the HPI.   Allergies  Review of patient's allergies indicates no known allergies.  Home Medications   Prior to Admission medications   Medication Sig Start Date End  Date Taking? Authorizing Provider  allopurinol (ZYLOPRIM) 100 MG tablet Take 1 tablet (100 mg total) by mouth daily. 06/28/15  Yes [name], MD  aspirin 81 MG tablet Take 1 tablet (81 mg total) by mouth daily. 07/11/15  Yes Shanker Kristeen Mans, MD  atenolol (TENORMIN) 50 MG tablet Take 25-50 mg by mouth See admin instructions. 25mg  on Monday, Wednesday and Friday then take 50 mg all the other days 08/02/15  Yes Historical Provider, MD  citalopram (CELEXA) 10 MG tablet Take 1 tablet (10 mg total) by mouth daily. 07/23/15  Yes [name], MD  docusate sodium (COLACE) 100 MG capsule Take 2 capsules (200 mg total) by mouth 2 (two) times daily. 06/22/15  Yes Thurnell Lose, MD  doxercalciferol (HECTOROL) 0.5 MCG capsule Take 1.5 mcg by mouth 3 (three) times a week. MWF   Yes Historical Provider, MD  Multiple Vitamin (MULTIVITAMIN WITH MINERALS) TABS tablet Take 1 tablet by mouth daily.   Yes Historical Provider, MD  rosuvastatin (CRESTOR) 20 MG tablet Take 1 tablet (20 mg total) by mouth at bedtime. 01/17/16  Yes [name], MD  amoxicillin-clavulanate (AUGMENTIN) 500-125 MG tablet Take 1 tablet (500 mg total) by mouth daily. Patient not taking: Reported on 02/28/2016 12/10/15   Velvet Bathe, MD  Nutritional Supplements (FEEDING SUPPLEMENT, NEPRO CARB STEADY,) LIQD Take 237 mLs by mouth as needed (missed meal during dialysis.). Patient not taking: Reported on 12/06/2015 07/10/15   Jonetta Osgood, MD   BP 136/65 mmHg  Pulse 84  Temp(Src) 97.8 F (36.6 C) (Oral)  Resp 15  SpO2 100% Physical Exam  Constitutional: He is oriented to person, place, and time. He appears well-developed and well-nourished. No distress.  HENT:  Head: Normocephalic and atraumatic.  Mouth/Throat: Oropharynx is clear and moist.  Eyes: Conjunctivae and EOM are normal.  No nystagmus  Cardiovascular: Normal rate.  An irregular rhythm present.  Murmur (systolic) heard. Pulmonary/Chest: Effort normal and breath sounds normal.   Musculoskeletal: He exhibits no edema.  Neurological: He is alert and oriented to person, place, and time. No cranial nerve deficit.  CN 2-12 intact Cerebellar exam reveals no dysmetria BUE and BLE sensory exam grossly normal Bilateral upper and lower extremity strength 4/5   Skin: Skin is warm and dry.  Psychiatric: He has a normal mood and affect.  Nursing note and vitals reviewed.   ED Course  Procedures   DIAGNOSTIC STUDIES:  Oxygen Saturation is 100% on RA, normal by my interpretation.    COORDINATION OF CARE:  11:25 PM Discussed treatment plan with pt at bedside and pt agreed to plan.  Labs Review Labs Reviewed  CBC WITH DIFFERENTIAL/PLATELET - Abnormal; Notable for the following:    RBC 3.40 (*)    Hemoglobin 9.7 (*)    HCT  31.8 (*)    RDW 17.3 (*)    Platelets 121 (*)    Lymphs Abs 0.6 (*)    Monocytes Absolute 1.1 (*)    All other components within normal limits  BASIC METABOLIC PANEL - Abnormal; Notable for the following:    BUN 23 (*)    Creatinine, Ser 6.48 (*)    Calcium 7.2 (*)    GFR calc non Af Amer 7 (*)    GFR calc Af Amer 8 (*)    All other components within normal limits  URINALYSIS, ROUTINE W REFLEX MICROSCOPIC (NOT AT [facility])    Imaging Review No results found. I have personally reviewed and evaluated these images and lab results as part of my medical decision-making.   EKG Interpretation None      MDM   Final diagnoses:  Dizziness  Syncope and collapse    I personally performed the services described in this documentation, which was scribed in my presence. The recorded information has been reviewed and is accurate.  Pt comes in with cc of syncope. Possible syncope with seizure.  PT has no cardiac hx, has had 2 ECHOs in the last 1 year, no significant abnormalities noted at that time. Pt also had a stroke in August - he had a full workup, and outside of abnormal MRI, everything else, including carotid and vertebral dopplers were  reassuring.  Discussed with the patient, that orthostatic syncope is possible, as pt reports that he had bend down to grab shaving material and got up when the symptoms started. He is HD patient and had a session today, which might have precipitated this. Still, he had dizziness with his stroke - so TIA has to be entertained. Currently neuro exam is benign.  PT given the option of obs admission vs. Outpatient workup - and the results from recent workup and the risk and benefits for each approach discussed- pt and family prefer outpatient management, so we will watch him on tele for 4 hours, and if he has no pathology on tele, we will d/c with neuro f/u.  Strict ER return precautions have been discussed, and patient is agreeing with the plan and is comfortable with the workup done and the recommendations from the ER.    Varney Biles, MD 02/29/16 [id number]

## 2016-02-29 ENCOUNTER — Telehealth: Payer: Self-pay | Admitting: Neurology

## 2016-02-29 DIAGNOSIS — R55 Syncope and collapse: Secondary | ICD-10-CM | POA: Diagnosis not present

## 2016-02-29 LAB — BASIC METABOLIC PANEL
Anion gap: 12 (ref 5–15)
BUN: 23 mg/dL — AB (ref 6–20)
CALCIUM: 7.2 mg/dL — AB (ref 8.9–10.3)
CO2: 26 mmol/L (ref 22–32)
CREATININE: 6.48 mg/dL — AB (ref 0.61–1.24)
Chloride: 103 mmol/L (ref 101–111)
GFR calc Af Amer: 8 mL/min — ABNORMAL LOW (ref >60)
GFR, EST NON AFRICAN AMERICAN: 7 mL/min — AB (ref >60)
GLUCOSE: 86 mg/dL (ref 65–99)
Potassium: 4.1 mmol/L (ref 3.5–5.1)
SODIUM: 141 mmol/L (ref 135–145)

## 2016-02-29 LAB — CBC WITH DIFFERENTIAL/PLATELET
Basophils Absolute: 0 10*3/uL (ref 0.0–0.1)
Basophils Relative: 0 %
EOS ABS: 0.1 10*3/uL (ref 0.0–0.7)
EOS PCT: 1 %
HCT: 31.8 % — ABNORMAL LOW (ref 39.0–52.0)
Hemoglobin: 9.7 g/dL — ABNORMAL LOW (ref 13.0–17.0)
LYMPHS ABS: 0.6 10*3/uL — AB (ref 0.7–4.0)
LYMPHS PCT: 11 %
MCH: 28.5 pg (ref 26.0–34.0)
MCHC: 30.5 g/dL (ref 30.0–36.0)
MCV: 93.5 fL (ref 78.0–100.0)
MONO ABS: 1.1 10*3/uL — AB (ref 0.1–1.0)
MONOS PCT: 19 %
Neutro Abs: 4 10*3/uL (ref 1.7–7.7)
Neutrophils Relative %: 69 %
PLATELETS: 121 10*3/uL — AB (ref 150–400)
RBC: 3.4 MIL/uL — ABNORMAL LOW (ref 4.22–5.81)
RDW: 17.3 % — ABNORMAL HIGH (ref 11.5–15.5)
WBC: 5.9 10*3/uL (ref 4.0–10.5)

## 2016-02-29 NOTE — Telephone Encounter (Signed)
Message sent to Dr. Xu for FYI.  

## 2016-02-29 NOTE — ED Notes (Signed)
Pt left with all his belongings and was wheeled out of the treatment area.  

## 2016-02-29 NOTE — Discharge Instructions (Signed)
We saw you in the ER for THE DIZZINESS AND LIKELY FAINTING SPELL. All the results in the ER are normal. We are not sure what is causing your symptoms. We suspect that it was orthostatic syncope, but as discussed TIA can be entertained as well. For that reason (Possible TIA), we want you to see your doctor as soon as possible.   Please return to the ER if you get dizzy or faint again, or have new one sided numbness, tingling, weakness or confusion, seizures, poor balance or poor vision.  Orthostatic Hypotension Orthostatic hypotension is a sudden drop in blood pressure. It happens when you quickly stand up from a seated or lying position. You may feel dizzy or light-headed. This can last for just a few seconds or for up to a few minutes. It is usually not a serious problem. However, if this happens frequently or gets worse, it can be a sign of something more serious. CAUSES  Different things can cause orthostatic hypotension, including:   Loss of body fluids (dehydration).  Medicines that lower blood pressure.  Sudden changes in posture, such as standing up quickly after you have been sitting or lying down.  Taking too much of your medicine. SIGNS AND SYMPTOMS   Light-headedness or dizziness.   Fainting or near-fainting.   A fast heart rate.   Weakness.   Feeling tired (fatigue).  DIAGNOSIS  Your health care provider may do several things to help diagnose your condition and identify the cause. These may include:   Taking a medical history and doing a physical exam.  Checking your blood pressure. Your health care provider will check your blood pressure when you are:  Lying down.  Sitting.  Standing.  Using tilt table testing. In this test, you lie down on a table that moves from a lying position to a standing position. You will be strapped onto the table. This test monitors your blood pressure and heart rate when you are in different positions. TREATMENT  Treatment will  vary depending on the cause. Possible treatments include:   Changing the dosage of your medicines.  Wearing compression stockings on your lower legs.  Standing up slowly after sitting or lying down.  Eating more salt.  Eating frequent, small meals.  In some cases, getting IV fluids.  Taking medicine to enhance fluid retention. HOME CARE INSTRUCTIONS  Only take over-the-counter or prescription medicines as directed by your health care provider.  Follow your health care provider's instructions for changing the dosage of your current medicines.  Do not stop or adjust your medicine on your own.  Stand up slowly after sitting or lying down. This allows your body to adjust to the different position.  Wear compression stockings as directed.  Eat extra salt as directed.  Do not add extra salt to your diet unless directed to by your health care provider.  Eat frequent, small meals.  Avoid standing suddenly after eating.  Avoid hot showers or excessive heat as directed by your health care provider.  Keep all follow-up appointments. SEEK MEDICAL CARE IF:  You continue to feel dizzy or light-headed after standing.  You feel groggy or confused.  You feel cold, clammy, or sick to your stomach (nauseous).  You have blurred vision.  You feel short of breath. SEEK IMMEDIATE MEDICAL CARE IF:   You faint after standing.  You have chest pain.  You have difficulty breathing.   You lose feeling or movement in your arms or legs.   You  have slurred speech or difficulty talking, or you are unable to talk.  MAKE SURE YOU:   Understand these instructions.  Will watch your condition.  Will get help right away if you are not doing well or get worse.   This information is not intended to replace advice given to you by your health care provider. Make sure you discuss any questions you have with your health care provider.   Document Released: 10/13/2002 Document Revised:  10/28/2013 Document Reviewed: 08/15/2013 Elsevier Interactive Patient Education 2016 [employer]   Transient Ischemic Attack A transient ischemic attack (TIA) is a "warning stroke" that causes stroke-like symptoms. Unlike a stroke, a TIA does not cause permanent damage to the brain. The symptoms of a TIA can happen very fast and do not last long. It is important to know the symptoms of a TIA and what to do. This can help prevent a major stroke or death. CAUSES  A TIA is caused by a temporary blockage in an artery in the brain or neck (carotid artery). The blockage does not allow the brain to get the blood supply it needs and can cause different symptoms. The blockage can be caused by either:  A blood clot.  Fatty buildup (plaque) in a neck or brain artery. RISK FACTORS  High blood pressure (hypertension).  High cholesterol.  Diabetes mellitus.  Heart disease.  The buildup of plaque in the blood vessels (peripheral artery disease or atherosclerosis).  The buildup of plaque in the blood vessels that provide blood and oxygen to the brain (carotid artery stenosis).  An abnormal heart rhythm (atrial fibrillation).  Obesity.  Using any tobacco products, including cigarettes, chewing tobacco, or electronic cigarettes.  Taking oral contraceptives, especially in combination with using tobacco.  Physical inactivity.  A diet high in fats, salt (sodium), and calories.  Excessive alcohol use.  Use of illegal drugs (especially cocaine and methamphetamine).  Being male.  Being African American.  Being over the age of 17 years.  Family history of stroke.  Previous history of blood clots, stroke, TIA, or heart attack.  Sickle cell disease. SIGNS AND SYMPTOMS  TIA symptoms are the same as a stroke but are temporary. These symptoms usually develop suddenly, or may be newly present upon waking from sleep:  Sudden weakness or numbness of the face, arm, or leg, especially on one  side of the body.  Sudden trouble walking or difficulty moving arms or legs.  Sudden confusion.  Sudden personality changes.  Trouble speaking (aphasia) or understanding.  Difficulty swallowing.  Sudden trouble seeing in one or both eyes.  Double vision.  Dizziness.  Loss of balance or coordination.  Sudden severe headache with no known cause.  Trouble reading or writing.  Loss of bowel or bladder control.  Loss of consciousness. DIAGNOSIS  Your health care provider may be able to determine the presence or absence of a TIA based on your symptoms, history, and physical exam. CT scan of the brain is usually performed to help identify a TIA. Other tests may include:  Electrocardiography (ECG).  Continuous heart monitoring.  Echocardiography.  Carotid ultrasonography.  MRI.  A scan of the brain circulation.  Blood tests. TREATMENT  Since the symptoms of TIA are the same as a stroke, it is important to seek treatment as soon as possible. You may need a medicine to dissolve a blood clot (thrombolytic) if that is the cause of the TIA. This medicine cannot be given if too much time has passed. Treatment  may also include:   Rest, oxygen, fluids through an IV tube, and medicines to thin the blood (anticoagulants).  Measures will be taken to prevent short-term and long-term complications, including infection from breathing foreign material into the lungs (aspiration pneumonia), blood clots in the legs, and falls.  Procedures to either remove plaque in the carotid arteries or dilate carotid arteries that have narrowed due to plaque. Those procedures are:  Carotid endarterectomy.  Carotid angioplasty and stenting.  Medicines and diet may be used to address diabetes, high blood pressure, and other underlying risk factors. HOME CARE INSTRUCTIONS   Take medicines only as directed by your health care provider. Follow the directions carefully. Medicines may be used to control  risk factors for a stroke. Be sure you understand all your medicine instructions.  You may be told to take aspirin or the anticoagulant warfarin. Warfarin needs to be taken exactly as instructed.  Taking too much or too little warfarin is dangerous. Too much warfarin increases the risk of bleeding. Too little warfarin continues to allow the risk for blood clots. While taking warfarin, you will need to have regular blood tests to measure your blood clotting time. A PT blood test measures how long it takes for blood to clot. Your PT is used to calculate another value called an INR. Your PT and INR help your health care provider to adjust your dose of warfarin. The dose can change for many reasons. It is critically important that you take warfarin exactly as prescribed.  Many foods, especially foods high in vitamin K can interfere with warfarin and affect the PT and INR. Foods high in vitamin K include spinach, kale, broccoli, cabbage, collard and turnip greens, Brussels sprouts, peas, cauliflower, seaweed, and parsley, as well as beef and pork liver, green tea, and soybean oil. You should eat a consistent amount of foods high in vitamin K. Avoid major changes in your diet, or notify your health care provider before changing your diet. Arrange a visit with a dietitian to answer your questions.  Many medicines can interfere with warfarin and affect the PT and INR. You must tell your health care provider about any and all medicines you take; this includes all vitamins and supplements. Be especially cautious with aspirin and anti-inflammatory medicines. Do not take or discontinue any prescribed or over-the-counter medicine except on the advice of your health care provider or pharmacist.  Warfarin can have side effects, such as excessive bruising or bleeding. You will need to hold pressure over cuts for longer than usual. Your health care provider or pharmacist will discuss other potential side effects.  Avoid  sports or activities that may cause injury or bleeding.  Be careful when shaving, flossing your teeth, or handling sharp objects.  Alcohol can change the body's ability to handle warfarin. It is best to avoid alcoholic drinks or consume only very small amounts while taking warfarin. Notify your health care provider if you change your alcohol intake.  Notify your dentist or other health care providers before procedures.  Eat a diet that includes 5 or more servings of fruits and vegetables each day. This may reduce the risk of stroke. Certain diets may be prescribed to address high blood pressure, high cholesterol, diabetes, or obesity.  A diet low in sodium, saturated fat, trans fat, and cholesterol is recommended to manage high blood pressure.  A diet low in saturated fat, trans fat, and cholesterol, and high in fiber may control cholesterol levels.  A controlled-carbohydrate, controlled-sugar diet  is recommended to manage diabetes.  A reduced-calorie diet that is low in sodium, saturated fat, trans fat, and cholesterol is recommended to manage obesity.  Maintain a healthy weight.  Stay physically active. It is recommended that you get at least 30 minutes of activity on most or all days.  Do not use any tobacco products, including cigarettes, chewing tobacco, or electronic cigarettes. If you need help quitting, ask your health care provider.  Limit alcohol intake to no more than 1 drink per day for nonpregnant women and 2 drinks per day for men. One drink equals 12 ounces of beer, 5 ounces of wine, or 1 ounces of hard liquor.  Do not abuse drugs.  A safe home environment is important to reduce the risk of falls. Your health care provider may arrange for specialists to evaluate your home. Having grab bars in the bedroom and bathroom is often important. Your health care provider may arrange for equipment to be used at home, such as raised toilets and a seat for the shower.  Follow all  instructions for follow-up with your health care provider. This is very important. This includes any referrals and lab tests. Proper follow-up can prevent a stroke or another TIA from occurring. PREVENTION  The risk of a TIA can be decreased by appropriately treating high blood pressure, high cholesterol, diabetes, heart disease, and obesity, and by quitting smoking, limiting alcohol, and staying physically active. SEEK MEDICAL CARE IF:  You have personality changes.  You have difficulty swallowing.  You are seeing double.  You have dizziness.  You have a fever. SEEK IMMEDIATE MEDICAL CARE IF:  Any of the following symptoms may represent a serious problem that is an emergency. Do not wait to see if the symptoms will go away. Get medical help right away. Call your local emergency services (911 in [place] Do not drive yourself to the hospital.  You have sudden weakness or numbness of the face, arm, or leg, especially on one side of the body.  You have sudden trouble walking or difficulty moving arms or legs.  You have sudden confusion.  You have trouble speaking (aphasia) or understanding.  You have sudden trouble seeing in one or both eyes.  You have a loss of balance or coordination.  You have a sudden, severe headache with no known cause.  You have new chest pain or an irregular heartbeat.  You have a partial or total loss of consciousness. MAKE SURE YOU:   Understand these instructions.  Will watch your condition.  Will get help right away if you are not doing well or get worse.   This information is not intended to replace advice given to you by your health care provider. Make sure you discuss any questions you have with your health care provider.   Document Released: 08/02/2005 Document Revised: 11/13/2014 Document Reviewed: 01/28/2014 Elsevier Interactive Patient Education [employer].

## 2016-02-29 NOTE — Telephone Encounter (Signed)
Noted. Thanks.  Rosalin Hawking, MD PhD Stroke Neurology 02/29/2016 4:23 PM

## 2016-02-29 NOTE — Telephone Encounter (Signed)
Spouse called to advise, patient went to ER last night, passed out at home, was unresponsive for 3-4 minutes, was transported by EMS to ER. Patient has appointment with Dr. Erlinda Hong Tuesday 03/07/16.

## 2016-03-01 DIAGNOSIS — N2581 Secondary hyperparathyroidism of renal origin: Secondary | ICD-10-CM | POA: Diagnosis not present

## 2016-03-01 DIAGNOSIS — N186 End stage renal disease: Secondary | ICD-10-CM | POA: Diagnosis not present

## 2016-03-03 DIAGNOSIS — N186 End stage renal disease: Secondary | ICD-10-CM | POA: Diagnosis not present

## 2016-03-03 DIAGNOSIS — N2581 Secondary hyperparathyroidism of renal origin: Secondary | ICD-10-CM | POA: Diagnosis not present

## 2016-03-05 DIAGNOSIS — N186 End stage renal disease: Secondary | ICD-10-CM | POA: Diagnosis not present

## 2016-03-05 DIAGNOSIS — I158 Other secondary hypertension: Secondary | ICD-10-CM | POA: Diagnosis not present

## 2016-03-05 DIAGNOSIS — Z992 Dependence on renal dialysis: Secondary | ICD-10-CM | POA: Diagnosis not present

## 2016-03-06 DIAGNOSIS — N186 End stage renal disease: Secondary | ICD-10-CM | POA: Diagnosis not present

## 2016-03-06 DIAGNOSIS — N2581 Secondary hyperparathyroidism of renal origin: Secondary | ICD-10-CM | POA: Diagnosis not present

## 2016-03-06 DIAGNOSIS — D631 Anemia in chronic kidney disease: Secondary | ICD-10-CM | POA: Diagnosis not present

## 2016-03-07 ENCOUNTER — Ambulatory Visit (INDEPENDENT_AMBULATORY_CARE_PROVIDER_SITE_OTHER): Payer: Medicare Other | Admitting: Neurology

## 2016-03-07 ENCOUNTER — Encounter: Payer: Self-pay | Admitting: Neurology

## 2016-03-07 VITALS — BP 112/55 | HR 86 | Ht 69.0 in | Wt 148.0 lb

## 2016-03-07 DIAGNOSIS — N186 End stage renal disease: Secondary | ICD-10-CM

## 2016-03-07 DIAGNOSIS — E785 Hyperlipidemia, unspecified: Secondary | ICD-10-CM | POA: Diagnosis not present

## 2016-03-07 DIAGNOSIS — Z992 Dependence on renal dialysis: Secondary | ICD-10-CM

## 2016-03-07 DIAGNOSIS — I63311 Cerebral infarction due to thrombosis of right middle cerebral artery: Secondary | ICD-10-CM

## 2016-03-07 DIAGNOSIS — I10 Essential (primary) hypertension: Secondary | ICD-10-CM | POA: Diagnosis not present

## 2016-03-07 MED ORDER — ATENOLOL 25 MG PO TABS
ORAL_TABLET | ORAL | Status: DC
Start: 2016-03-07 — End: 2016-07-06

## 2016-03-07 NOTE — Progress Notes (Signed)
STROKE NEUROLOGY FOLLOW UP NOTE  NAME: [name] DOB: [birth year]  REASON FOR VISIT: stroke follow up HISTORY FROM: wife and chart  Today we had the pleasure of seeing [name] in follow-up at our Neurology [facility]. Pt was accompanied by wife.   History Summary Mr. [name] is a 82 y.o. male with history of ESRD on hemodialysis, colonic diverticulosis with LGIB requiring blood transfusion, hypertension and hyperlipidemia was admitted on 07/05/15 for left facial droop, weakness and fatigue after a brief episode of loss of consciousness. MRI showed left centrum semiovale subcortical infarct, likely due to small vessel disease. Stroke work up including TCD, TTE, CUS, venous doppler UE and LE, A1C all negative. LDL 98. Pt had several episodes of GIB with anemia requiring blood transfusion so that ASA was on hold initially. EGD/colonoscopy on 8/12 was negative, and Capsule Endoscopy negative as well. GI suspects a stuttering diverticular bleed. ASA was resumed after GI agreement. Pt was discharged with ASA and crestor.  09/09/15 follow up - the patient has been doing well. No more GIB. Continues on ASA without issue. His BP at home 112-114 but today 98/50. Pt asymptomatic. Denies any dizziness or feeling weak. atenolol discontinued so far. Continues to have HD for ESRD. Asking for driving.    Interval History During the interval time, he was doing well. He went to ER on 02/28/16 for episode of passing out. He had HD that day in the morning. In the evening, he was having shower, he bent down to get shower material and when he got up, he passed out and fall. Wife got him out of shower and put him in supine position, he gradually recovered. EMS checked his orthostatic vital showed difference of BPs and sent him to ER. He was conscious and no confusion or post ictal with EMS. In ER BP recovered and he was sent back home. As per wife, he is taking atenolol 25mg  on dialysis days and 50mg  other  days. BP usually 100-110 at home. Today BP 112/55.  REVIEW OF SYSTEMS: Full 14 system review of systems performed and notable only for those listed below and in HPI above, all others are negative:  Constitutional:   Cardiovascular:  Ear/Nose/Throat:   Skin: rash Eyes:   Respiratory:   Gastroitestinal:   Genitourinary:  Hematology/Lymphatic:  anemia Endocrine:  Musculoskeletal:   Allergy/Immunology:   Neurological:   Psychiatric:  Sleep:   The following represents the patient's updated allergies and side effects list: No Known Allergies  The neurologically relevant items on the patient's problem list were reviewed on today's visit.  Neurologic Examination  A problem focused neurological exam (12 or more points of the single system neurologic examination, vital signs counts as 1 point, cranial nerves count for 8 points) was performed.  Blood pressure 112/55, pulse 86, height 5\' 9"  (1.753 m), weight 148 lb (67.132 kg).  General - Well nourished, well developed, in no apparent distress.  Ophthalmologic - Fundi not visualized due to eye movement.  Cardiovascular - Regular rate and rhythm.  Mental Status -  Level of arousal and orientation to month, place, and person were intact, but not to year. Language including expression, naming, repetition, comprehension was assessed and found intact. Fund of Knowledge was assessed and was intact impaired.  Cranial Nerves II - XII - II - Visual field intact OU. III, IV, VI - Extraocular movements intact. V - Facial sensation intact bilaterally. VII - left nasolabial fold flattening. VIII - Hearing &  vestibular intact bilaterally. X - Palate elevates symmetrically. XI - Chin turning & shoulder shrug intact bilaterally. XII - Tongue protrusion intact.  Motor Strength - The patient's strength was normal in all extremities and pronator drift was absent.  Bulk was normal and fasciculations were absent.   Motor Tone - Muscle tone was  assessed at the neck and appendages and was normal.  Reflexes - The patient's reflexes were 1+ in all extremities and he had no pathological reflexes.  Sensory - Light touch, temperature/pinprick were assessed and were normal.    Coordination - The patient had normal movements in the hands and feet with no ataxia or dysmetria.  Tremor was absent.  Gait and Station - The patient's transfers, gait, station, and turns were observed as normal except mild stooped posturing.  Data reviewed: I personally reviewed the images and agree with the radiology interpretations.  Mr Brain Wo Contrast 07/05/2015 1 x 1 x 2 cm area of nonhemorrhagic acute infarction affecting the subcortical and periventricular deep white matter of the RIGHT centrum semiovale. Chronic changes as described.   CUS - Bilateral: 1-39% ICA stenosis. Vertebral artery flow is antegrade.  UE venous doppler - Bilateral: No evidence of DVT or superficial thrombosis. Left: Hemodialysis graft is patent, however, there is not complete color fill. No echoes within the graft to suggest thrombus.   LE venous doppler - negative for DVT bilaterally  2D echo - Left ventricle: The cavity size was normal. Systolic function washyperdynamic. The estimated ejection fraction was in the range of70% to 75%. There was dynamic obstruction in the mid cavity, witha peak velocity of 200 cm/sec and a peak gradient of 16 mm Hg.Wall motion was normal; there were no regional wall motionabnormalities. There was an increased relative contribution ofatrial contraction to ventricular filling, which may be due tohypovolemia. - Aortic valve: Valve area (VTI): 2.73 cm^2. Valve area (Vmax):2.05 cm^2. Valve area (Vmean): 2.14 cm^2. - Right ventricle: Systolic function was hyperdynamic. - Pulmonary arteries: PA peak pressure: 33 mm Hg (S). - Systemic veins: The inferior vena cava is collapsed. Impressions: Findings are consistent with  hypovolemia.  TCD - This was a normal transcranial Doppler study, with normal flow direction and velocity of all identified vessels of the anterior and posterior circulations, with no evidence of stenosis, vasospasm or occlusion. There was no evidence of intracranial disease.  Component     Latest Ref Rng 07/05/2015  Cholesterol     0 - 200 mg/dL 188  Triglycerides     <150 mg/dL 123  HDL Cholesterol     >40 mg/dL 65  Total CHOL/HDL Ratio      2.9  VLDL     0 - 40 mg/dL 25  LDL (calc)     0 - 99 mg/dL 98  Hemoglobin A1C     4.8 - 5.6 % 4.9  Mean Plasma Glucose      94  TSH     0.350 - 4.500 uIU/mL 0.551  Free T4     0.61 - 1.12 ng/dL 1.00    Assessment: As you may recall, he is a 82 y.o. African American male with PMH of ESRD on hemodialysis, colonic diverticulosis with LGIB requiring blood transfusion, hypertension and hyperlipidemia was admitted on 07/05/15 for left centrum semiovale subcortical infarct, likely due to small vessel disease. Stroke work up including TCD, TTE, CUS, venous doppler UE and LE, A1C all negative. LDL 98. Pt had GIB with anemia requiring blood transfusion so that ASA was on  hold initially. GI work up negative and ASA was resumed after GI agreement. Pt was discharged with ASA and crestor. During the interval time, no GIB. On ASA without issue. BP at low side but asymptomatic. However, he had syncope episode on 02/28/16. BP borderline low and positive orthostatic. No seizure or stroke. He is on atenolol now and will decrease dose.   Plan:  - Continue ASA and crestor for stroke prevention - check BP at home and goal 110-130 - Follow up with your primary care physician for stroke risk factor modification. Recommend maintain blood pressure goal 110-130/80, diabetes with hemoglobin A1c goal below 6.5% and lipids with LDL cholesterol goal below 70 mg/dL.  - will decrease atenolol to 12.5mg  on dialysis days and 25mg  all other days.  - avoid driving on dialysis  days and for all other days take BP before driving. BP goal 11-130.  - follow up in 6 months  I spent more than 25 minutes of face to face time with the patient. Greater than 50% of time was spent in counseling and coordination of care. We have discussed about BP management, BP check at home and driving precautions.   No orders of the defined types were placed in this encounter.    Meds ordered this encounter  Medications  . atenolol (TENORMIN) 25 MG tablet    Sig: 12.5mg  on Monday, Wednesday and Friday then take 25 mg all the other days    Dispense:  50 tablet    Refill:  2    Patient Instructions  - Continue ASA and crestor for stroke prevention - check BP at home and goal 110-130 - Follow up with your primary care physician for stroke risk factor modification. Recommend maintain blood pressure goal 110-130/80, diabetes with hemoglobin A1c goal below 6.5% and lipids with LDL cholesterol goal below 70 mg/dL.  - will decrease your BP meds, take 12.5mg  on dialysis days and 25mg  all other days.  - avoid driving on dialysis days and for all other days take BP before driving. BP goal 11-130.  - healthy diet and regular exercise - follow up in 6 months    Rosalin Hawking, MD PhD [facility] [address], [place], [place] [address] [phone]

## 2016-03-07 NOTE — Patient Instructions (Signed)
-   Continue ASA and crestor for stroke prevention - check BP at home and goal 110-130 - Follow up with your primary care physician for stroke risk factor modification. Recommend maintain blood pressure goal 110-130/80, diabetes with hemoglobin A1c goal below 6.5% and lipids with LDL cholesterol goal below 70 mg/dL.  - will decrease your BP meds, take 12.5mg  on dialysis days and 25mg  all other days.  - avoid driving on dialysis days and for all other days take BP before driving. BP goal 11-130.  - healthy diet and regular exercise - follow up in 6 months

## 2016-03-08 DIAGNOSIS — N186 End stage renal disease: Secondary | ICD-10-CM | POA: Diagnosis not present

## 2016-03-08 DIAGNOSIS — N2581 Secondary hyperparathyroidism of renal origin: Secondary | ICD-10-CM | POA: Diagnosis not present

## 2016-03-08 DIAGNOSIS — D631 Anemia in chronic kidney disease: Secondary | ICD-10-CM | POA: Diagnosis not present

## 2016-03-10 DIAGNOSIS — D631 Anemia in chronic kidney disease: Secondary | ICD-10-CM | POA: Diagnosis not present

## 2016-03-10 DIAGNOSIS — N2581 Secondary hyperparathyroidism of renal origin: Secondary | ICD-10-CM | POA: Diagnosis not present

## 2016-03-10 DIAGNOSIS — N186 End stage renal disease: Secondary | ICD-10-CM | POA: Diagnosis not present

## 2016-03-13 DIAGNOSIS — D631 Anemia in chronic kidney disease: Secondary | ICD-10-CM | POA: Diagnosis not present

## 2016-03-13 DIAGNOSIS — N186 End stage renal disease: Secondary | ICD-10-CM | POA: Diagnosis not present

## 2016-03-13 DIAGNOSIS — N2581 Secondary hyperparathyroidism of renal origin: Secondary | ICD-10-CM | POA: Diagnosis not present

## 2016-03-15 DIAGNOSIS — D631 Anemia in chronic kidney disease: Secondary | ICD-10-CM | POA: Diagnosis not present

## 2016-03-15 DIAGNOSIS — N2581 Secondary hyperparathyroidism of renal origin: Secondary | ICD-10-CM | POA: Diagnosis not present

## 2016-03-15 DIAGNOSIS — N186 End stage renal disease: Secondary | ICD-10-CM | POA: Diagnosis not present

## 2016-03-17 DIAGNOSIS — N2581 Secondary hyperparathyroidism of renal origin: Secondary | ICD-10-CM | POA: Diagnosis not present

## 2016-03-17 DIAGNOSIS — N186 End stage renal disease: Secondary | ICD-10-CM | POA: Diagnosis not present

## 2016-03-17 DIAGNOSIS — D631 Anemia in chronic kidney disease: Secondary | ICD-10-CM | POA: Diagnosis not present

## 2016-03-20 DIAGNOSIS — D631 Anemia in chronic kidney disease: Secondary | ICD-10-CM | POA: Diagnosis not present

## 2016-03-20 DIAGNOSIS — N186 End stage renal disease: Secondary | ICD-10-CM | POA: Diagnosis not present

## 2016-03-20 DIAGNOSIS — N2581 Secondary hyperparathyroidism of renal origin: Secondary | ICD-10-CM | POA: Diagnosis not present

## 2016-03-22 DIAGNOSIS — N2581 Secondary hyperparathyroidism of renal origin: Secondary | ICD-10-CM | POA: Diagnosis not present

## 2016-03-22 DIAGNOSIS — D631 Anemia in chronic kidney disease: Secondary | ICD-10-CM | POA: Diagnosis not present

## 2016-03-22 DIAGNOSIS — N186 End stage renal disease: Secondary | ICD-10-CM | POA: Diagnosis not present

## 2016-03-24 DIAGNOSIS — N2581 Secondary hyperparathyroidism of renal origin: Secondary | ICD-10-CM | POA: Diagnosis not present

## 2016-03-24 DIAGNOSIS — N186 End stage renal disease: Secondary | ICD-10-CM | POA: Diagnosis not present

## 2016-03-24 DIAGNOSIS — D631 Anemia in chronic kidney disease: Secondary | ICD-10-CM | POA: Diagnosis not present

## 2016-03-27 DIAGNOSIS — N186 End stage renal disease: Secondary | ICD-10-CM | POA: Diagnosis not present

## 2016-03-27 DIAGNOSIS — D631 Anemia in chronic kidney disease: Secondary | ICD-10-CM | POA: Diagnosis not present

## 2016-03-27 DIAGNOSIS — N2581 Secondary hyperparathyroidism of renal origin: Secondary | ICD-10-CM | POA: Diagnosis not present

## 2016-03-29 DIAGNOSIS — D631 Anemia in chronic kidney disease: Secondary | ICD-10-CM | POA: Diagnosis not present

## 2016-03-29 DIAGNOSIS — N2581 Secondary hyperparathyroidism of renal origin: Secondary | ICD-10-CM | POA: Diagnosis not present

## 2016-03-29 DIAGNOSIS — N186 End stage renal disease: Secondary | ICD-10-CM | POA: Diagnosis not present

## 2016-03-30 DIAGNOSIS — C61 Malignant neoplasm of prostate: Secondary | ICD-10-CM | POA: Diagnosis not present

## 2016-03-31 DIAGNOSIS — N186 End stage renal disease: Secondary | ICD-10-CM | POA: Diagnosis not present

## 2016-03-31 DIAGNOSIS — N2581 Secondary hyperparathyroidism of renal origin: Secondary | ICD-10-CM | POA: Diagnosis not present

## 2016-03-31 DIAGNOSIS — D631 Anemia in chronic kidney disease: Secondary | ICD-10-CM | POA: Diagnosis not present

## 2016-04-03 DIAGNOSIS — N186 End stage renal disease: Secondary | ICD-10-CM | POA: Diagnosis not present

## 2016-04-03 DIAGNOSIS — N2581 Secondary hyperparathyroidism of renal origin: Secondary | ICD-10-CM | POA: Diagnosis not present

## 2016-04-03 DIAGNOSIS — D631 Anemia in chronic kidney disease: Secondary | ICD-10-CM | POA: Diagnosis not present

## 2016-04-05 DIAGNOSIS — D631 Anemia in chronic kidney disease: Secondary | ICD-10-CM | POA: Diagnosis not present

## 2016-04-05 DIAGNOSIS — N186 End stage renal disease: Secondary | ICD-10-CM | POA: Diagnosis not present

## 2016-04-05 DIAGNOSIS — N2581 Secondary hyperparathyroidism of renal origin: Secondary | ICD-10-CM | POA: Diagnosis not present

## 2016-04-05 DIAGNOSIS — Z992 Dependence on renal dialysis: Secondary | ICD-10-CM | POA: Diagnosis not present

## 2016-04-05 DIAGNOSIS — I158 Other secondary hypertension: Secondary | ICD-10-CM | POA: Diagnosis not present

## 2016-04-07 DIAGNOSIS — D631 Anemia in chronic kidney disease: Secondary | ICD-10-CM | POA: Diagnosis not present

## 2016-04-07 DIAGNOSIS — N186 End stage renal disease: Secondary | ICD-10-CM | POA: Diagnosis not present

## 2016-04-07 DIAGNOSIS — N2581 Secondary hyperparathyroidism of renal origin: Secondary | ICD-10-CM | POA: Diagnosis not present

## 2016-04-10 DIAGNOSIS — N2581 Secondary hyperparathyroidism of renal origin: Secondary | ICD-10-CM | POA: Diagnosis not present

## 2016-04-10 DIAGNOSIS — D631 Anemia in chronic kidney disease: Secondary | ICD-10-CM | POA: Diagnosis not present

## 2016-04-10 DIAGNOSIS — N186 End stage renal disease: Secondary | ICD-10-CM | POA: Diagnosis not present

## 2016-04-12 DIAGNOSIS — D631 Anemia in chronic kidney disease: Secondary | ICD-10-CM | POA: Diagnosis not present

## 2016-04-12 DIAGNOSIS — N2581 Secondary hyperparathyroidism of renal origin: Secondary | ICD-10-CM | POA: Diagnosis not present

## 2016-04-12 DIAGNOSIS — N186 End stage renal disease: Secondary | ICD-10-CM | POA: Diagnosis not present

## 2016-04-14 DIAGNOSIS — N186 End stage renal disease: Secondary | ICD-10-CM | POA: Diagnosis not present

## 2016-04-14 DIAGNOSIS — N2581 Secondary hyperparathyroidism of renal origin: Secondary | ICD-10-CM | POA: Diagnosis not present

## 2016-04-14 DIAGNOSIS — D631 Anemia in chronic kidney disease: Secondary | ICD-10-CM | POA: Diagnosis not present

## 2016-04-17 DIAGNOSIS — N2581 Secondary hyperparathyroidism of renal origin: Secondary | ICD-10-CM | POA: Diagnosis not present

## 2016-04-17 DIAGNOSIS — N186 End stage renal disease: Secondary | ICD-10-CM | POA: Diagnosis not present

## 2016-04-17 DIAGNOSIS — D631 Anemia in chronic kidney disease: Secondary | ICD-10-CM | POA: Diagnosis not present

## 2016-04-19 DIAGNOSIS — N2581 Secondary hyperparathyroidism of renal origin: Secondary | ICD-10-CM | POA: Diagnosis not present

## 2016-04-19 DIAGNOSIS — N186 End stage renal disease: Secondary | ICD-10-CM | POA: Diagnosis not present

## 2016-04-19 DIAGNOSIS — D631 Anemia in chronic kidney disease: Secondary | ICD-10-CM | POA: Diagnosis not present

## 2016-04-20 DIAGNOSIS — N3941 Urge incontinence: Secondary | ICD-10-CM | POA: Diagnosis not present

## 2016-04-20 DIAGNOSIS — E291 Testicular hypofunction: Secondary | ICD-10-CM | POA: Diagnosis not present

## 2016-04-20 DIAGNOSIS — C61 Malignant neoplasm of prostate: Secondary | ICD-10-CM | POA: Diagnosis not present

## 2016-04-21 DIAGNOSIS — N186 End stage renal disease: Secondary | ICD-10-CM | POA: Diagnosis not present

## 2016-04-21 DIAGNOSIS — D631 Anemia in chronic kidney disease: Secondary | ICD-10-CM | POA: Diagnosis not present

## 2016-04-21 DIAGNOSIS — N2581 Secondary hyperparathyroidism of renal origin: Secondary | ICD-10-CM | POA: Diagnosis not present

## 2016-04-24 DIAGNOSIS — N2581 Secondary hyperparathyroidism of renal origin: Secondary | ICD-10-CM | POA: Diagnosis not present

## 2016-04-24 DIAGNOSIS — D631 Anemia in chronic kidney disease: Secondary | ICD-10-CM | POA: Diagnosis not present

## 2016-04-24 DIAGNOSIS — N186 End stage renal disease: Secondary | ICD-10-CM | POA: Diagnosis not present

## 2016-04-26 DIAGNOSIS — N2581 Secondary hyperparathyroidism of renal origin: Secondary | ICD-10-CM | POA: Diagnosis not present

## 2016-04-26 DIAGNOSIS — N186 End stage renal disease: Secondary | ICD-10-CM | POA: Diagnosis not present

## 2016-04-26 DIAGNOSIS — D631 Anemia in chronic kidney disease: Secondary | ICD-10-CM | POA: Diagnosis not present

## 2016-04-28 DIAGNOSIS — N2581 Secondary hyperparathyroidism of renal origin: Secondary | ICD-10-CM | POA: Diagnosis not present

## 2016-04-28 DIAGNOSIS — D631 Anemia in chronic kidney disease: Secondary | ICD-10-CM | POA: Diagnosis not present

## 2016-04-28 DIAGNOSIS — N186 End stage renal disease: Secondary | ICD-10-CM | POA: Diagnosis not present

## 2016-05-01 DIAGNOSIS — N186 End stage renal disease: Secondary | ICD-10-CM | POA: Diagnosis not present

## 2016-05-01 DIAGNOSIS — D631 Anemia in chronic kidney disease: Secondary | ICD-10-CM | POA: Diagnosis not present

## 2016-05-01 DIAGNOSIS — N2581 Secondary hyperparathyroidism of renal origin: Secondary | ICD-10-CM | POA: Diagnosis not present

## 2016-05-03 DIAGNOSIS — N2581 Secondary hyperparathyroidism of renal origin: Secondary | ICD-10-CM | POA: Diagnosis not present

## 2016-05-03 DIAGNOSIS — N186 End stage renal disease: Secondary | ICD-10-CM | POA: Diagnosis not present

## 2016-05-03 DIAGNOSIS — D631 Anemia in chronic kidney disease: Secondary | ICD-10-CM | POA: Diagnosis not present

## 2016-05-05 DIAGNOSIS — I158 Other secondary hypertension: Secondary | ICD-10-CM | POA: Diagnosis not present

## 2016-05-05 DIAGNOSIS — N186 End stage renal disease: Secondary | ICD-10-CM | POA: Diagnosis not present

## 2016-05-05 DIAGNOSIS — Z992 Dependence on renal dialysis: Secondary | ICD-10-CM | POA: Diagnosis not present

## 2016-05-05 DIAGNOSIS — D631 Anemia in chronic kidney disease: Secondary | ICD-10-CM | POA: Diagnosis not present

## 2016-05-05 DIAGNOSIS — N2581 Secondary hyperparathyroidism of renal origin: Secondary | ICD-10-CM | POA: Diagnosis not present

## 2016-05-08 DIAGNOSIS — N186 End stage renal disease: Secondary | ICD-10-CM | POA: Diagnosis not present

## 2016-05-08 DIAGNOSIS — N2581 Secondary hyperparathyroidism of renal origin: Secondary | ICD-10-CM | POA: Diagnosis not present

## 2016-05-08 DIAGNOSIS — D631 Anemia in chronic kidney disease: Secondary | ICD-10-CM | POA: Diagnosis not present

## 2016-05-10 DIAGNOSIS — N186 End stage renal disease: Secondary | ICD-10-CM | POA: Diagnosis not present

## 2016-05-10 DIAGNOSIS — N2581 Secondary hyperparathyroidism of renal origin: Secondary | ICD-10-CM | POA: Diagnosis not present

## 2016-05-10 DIAGNOSIS — D631 Anemia in chronic kidney disease: Secondary | ICD-10-CM | POA: Diagnosis not present

## 2016-05-12 DIAGNOSIS — N186 End stage renal disease: Secondary | ICD-10-CM | POA: Diagnosis not present

## 2016-05-12 DIAGNOSIS — N2581 Secondary hyperparathyroidism of renal origin: Secondary | ICD-10-CM | POA: Diagnosis not present

## 2016-05-12 DIAGNOSIS — D631 Anemia in chronic kidney disease: Secondary | ICD-10-CM | POA: Diagnosis not present

## 2016-05-15 DIAGNOSIS — N2581 Secondary hyperparathyroidism of renal origin: Secondary | ICD-10-CM | POA: Diagnosis not present

## 2016-05-15 DIAGNOSIS — N186 End stage renal disease: Secondary | ICD-10-CM | POA: Diagnosis not present

## 2016-05-15 DIAGNOSIS — D631 Anemia in chronic kidney disease: Secondary | ICD-10-CM | POA: Diagnosis not present

## 2016-05-17 DIAGNOSIS — N186 End stage renal disease: Secondary | ICD-10-CM | POA: Diagnosis not present

## 2016-05-17 DIAGNOSIS — D631 Anemia in chronic kidney disease: Secondary | ICD-10-CM | POA: Diagnosis not present

## 2016-05-17 DIAGNOSIS — N2581 Secondary hyperparathyroidism of renal origin: Secondary | ICD-10-CM | POA: Diagnosis not present

## 2016-05-19 DIAGNOSIS — D631 Anemia in chronic kidney disease: Secondary | ICD-10-CM | POA: Diagnosis not present

## 2016-05-19 DIAGNOSIS — N2581 Secondary hyperparathyroidism of renal origin: Secondary | ICD-10-CM | POA: Diagnosis not present

## 2016-05-19 DIAGNOSIS — N186 End stage renal disease: Secondary | ICD-10-CM | POA: Diagnosis not present

## 2016-05-22 DIAGNOSIS — N186 End stage renal disease: Secondary | ICD-10-CM | POA: Diagnosis not present

## 2016-05-22 DIAGNOSIS — N2581 Secondary hyperparathyroidism of renal origin: Secondary | ICD-10-CM | POA: Diagnosis not present

## 2016-05-22 DIAGNOSIS — D631 Anemia in chronic kidney disease: Secondary | ICD-10-CM | POA: Diagnosis not present

## 2016-05-24 DIAGNOSIS — N2581 Secondary hyperparathyroidism of renal origin: Secondary | ICD-10-CM | POA: Diagnosis not present

## 2016-05-24 DIAGNOSIS — N186 End stage renal disease: Secondary | ICD-10-CM | POA: Diagnosis not present

## 2016-05-24 DIAGNOSIS — D631 Anemia in chronic kidney disease: Secondary | ICD-10-CM | POA: Diagnosis not present

## 2016-05-26 DIAGNOSIS — D631 Anemia in chronic kidney disease: Secondary | ICD-10-CM | POA: Diagnosis not present

## 2016-05-26 DIAGNOSIS — N186 End stage renal disease: Secondary | ICD-10-CM | POA: Diagnosis not present

## 2016-05-26 DIAGNOSIS — N2581 Secondary hyperparathyroidism of renal origin: Secondary | ICD-10-CM | POA: Diagnosis not present

## 2016-05-29 DIAGNOSIS — D631 Anemia in chronic kidney disease: Secondary | ICD-10-CM | POA: Diagnosis not present

## 2016-05-29 DIAGNOSIS — N186 End stage renal disease: Secondary | ICD-10-CM | POA: Diagnosis not present

## 2016-05-29 DIAGNOSIS — N2581 Secondary hyperparathyroidism of renal origin: Secondary | ICD-10-CM | POA: Diagnosis not present

## 2016-05-31 DIAGNOSIS — D631 Anemia in chronic kidney disease: Secondary | ICD-10-CM | POA: Diagnosis not present

## 2016-05-31 DIAGNOSIS — N186 End stage renal disease: Secondary | ICD-10-CM | POA: Diagnosis not present

## 2016-05-31 DIAGNOSIS — N2581 Secondary hyperparathyroidism of renal origin: Secondary | ICD-10-CM | POA: Diagnosis not present

## 2016-06-02 DIAGNOSIS — N186 End stage renal disease: Secondary | ICD-10-CM | POA: Diagnosis not present

## 2016-06-02 DIAGNOSIS — N2581 Secondary hyperparathyroidism of renal origin: Secondary | ICD-10-CM | POA: Diagnosis not present

## 2016-06-02 DIAGNOSIS — D631 Anemia in chronic kidney disease: Secondary | ICD-10-CM | POA: Diagnosis not present

## 2016-06-05 DIAGNOSIS — N2581 Secondary hyperparathyroidism of renal origin: Secondary | ICD-10-CM | POA: Diagnosis not present

## 2016-06-05 DIAGNOSIS — D631 Anemia in chronic kidney disease: Secondary | ICD-10-CM | POA: Diagnosis not present

## 2016-06-05 DIAGNOSIS — I158 Other secondary hypertension: Secondary | ICD-10-CM | POA: Diagnosis not present

## 2016-06-05 DIAGNOSIS — Z992 Dependence on renal dialysis: Secondary | ICD-10-CM | POA: Diagnosis not present

## 2016-06-05 DIAGNOSIS — N186 End stage renal disease: Secondary | ICD-10-CM | POA: Diagnosis not present

## 2016-06-07 DIAGNOSIS — D509 Iron deficiency anemia, unspecified: Secondary | ICD-10-CM | POA: Diagnosis not present

## 2016-06-07 DIAGNOSIS — N2581 Secondary hyperparathyroidism of renal origin: Secondary | ICD-10-CM | POA: Diagnosis not present

## 2016-06-07 DIAGNOSIS — N186 End stage renal disease: Secondary | ICD-10-CM | POA: Diagnosis not present

## 2016-06-09 DIAGNOSIS — N186 End stage renal disease: Secondary | ICD-10-CM | POA: Diagnosis not present

## 2016-06-09 DIAGNOSIS — N2581 Secondary hyperparathyroidism of renal origin: Secondary | ICD-10-CM | POA: Diagnosis not present

## 2016-06-09 DIAGNOSIS — D509 Iron deficiency anemia, unspecified: Secondary | ICD-10-CM | POA: Diagnosis not present

## 2016-06-12 DIAGNOSIS — N186 End stage renal disease: Secondary | ICD-10-CM | POA: Diagnosis not present

## 2016-06-12 DIAGNOSIS — D509 Iron deficiency anemia, unspecified: Secondary | ICD-10-CM | POA: Diagnosis not present

## 2016-06-12 DIAGNOSIS — N2581 Secondary hyperparathyroidism of renal origin: Secondary | ICD-10-CM | POA: Diagnosis not present

## 2016-06-14 DIAGNOSIS — D509 Iron deficiency anemia, unspecified: Secondary | ICD-10-CM | POA: Diagnosis not present

## 2016-06-14 DIAGNOSIS — N186 End stage renal disease: Secondary | ICD-10-CM | POA: Diagnosis not present

## 2016-06-14 DIAGNOSIS — N2581 Secondary hyperparathyroidism of renal origin: Secondary | ICD-10-CM | POA: Diagnosis not present

## 2016-06-16 DIAGNOSIS — N186 End stage renal disease: Secondary | ICD-10-CM | POA: Diagnosis not present

## 2016-06-16 DIAGNOSIS — N2581 Secondary hyperparathyroidism of renal origin: Secondary | ICD-10-CM | POA: Diagnosis not present

## 2016-06-16 DIAGNOSIS — D509 Iron deficiency anemia, unspecified: Secondary | ICD-10-CM | POA: Diagnosis not present

## 2016-06-19 DIAGNOSIS — N186 End stage renal disease: Secondary | ICD-10-CM | POA: Diagnosis not present

## 2016-06-19 DIAGNOSIS — N2581 Secondary hyperparathyroidism of renal origin: Secondary | ICD-10-CM | POA: Diagnosis not present

## 2016-06-19 DIAGNOSIS — D509 Iron deficiency anemia, unspecified: Secondary | ICD-10-CM | POA: Diagnosis not present

## 2016-06-21 DIAGNOSIS — D509 Iron deficiency anemia, unspecified: Secondary | ICD-10-CM | POA: Diagnosis not present

## 2016-06-21 DIAGNOSIS — N186 End stage renal disease: Secondary | ICD-10-CM | POA: Diagnosis not present

## 2016-06-21 DIAGNOSIS — N2581 Secondary hyperparathyroidism of renal origin: Secondary | ICD-10-CM | POA: Diagnosis not present

## 2016-06-23 DIAGNOSIS — N2581 Secondary hyperparathyroidism of renal origin: Secondary | ICD-10-CM | POA: Diagnosis not present

## 2016-06-23 DIAGNOSIS — N186 End stage renal disease: Secondary | ICD-10-CM | POA: Diagnosis not present

## 2016-06-23 DIAGNOSIS — D509 Iron deficiency anemia, unspecified: Secondary | ICD-10-CM | POA: Diagnosis not present

## 2016-06-26 DIAGNOSIS — N186 End stage renal disease: Secondary | ICD-10-CM | POA: Diagnosis not present

## 2016-06-26 DIAGNOSIS — N2581 Secondary hyperparathyroidism of renal origin: Secondary | ICD-10-CM | POA: Diagnosis not present

## 2016-06-26 DIAGNOSIS — D509 Iron deficiency anemia, unspecified: Secondary | ICD-10-CM | POA: Diagnosis not present

## 2016-06-28 DIAGNOSIS — N186 End stage renal disease: Secondary | ICD-10-CM | POA: Diagnosis not present

## 2016-06-28 DIAGNOSIS — D509 Iron deficiency anemia, unspecified: Secondary | ICD-10-CM | POA: Diagnosis not present

## 2016-06-28 DIAGNOSIS — N2581 Secondary hyperparathyroidism of renal origin: Secondary | ICD-10-CM | POA: Diagnosis not present

## 2016-06-30 DIAGNOSIS — D509 Iron deficiency anemia, unspecified: Secondary | ICD-10-CM | POA: Diagnosis not present

## 2016-06-30 DIAGNOSIS — N186 End stage renal disease: Secondary | ICD-10-CM | POA: Diagnosis not present

## 2016-06-30 DIAGNOSIS — N2581 Secondary hyperparathyroidism of renal origin: Secondary | ICD-10-CM | POA: Diagnosis not present

## 2016-07-03 DIAGNOSIS — N186 End stage renal disease: Secondary | ICD-10-CM | POA: Diagnosis not present

## 2016-07-03 DIAGNOSIS — D509 Iron deficiency anemia, unspecified: Secondary | ICD-10-CM | POA: Diagnosis not present

## 2016-07-03 DIAGNOSIS — N2581 Secondary hyperparathyroidism of renal origin: Secondary | ICD-10-CM | POA: Diagnosis not present

## 2016-07-04 DIAGNOSIS — I871 Compression of vein: Secondary | ICD-10-CM | POA: Diagnosis not present

## 2016-07-04 DIAGNOSIS — N186 End stage renal disease: Secondary | ICD-10-CM | POA: Diagnosis not present

## 2016-07-04 DIAGNOSIS — Z992 Dependence on renal dialysis: Secondary | ICD-10-CM | POA: Diagnosis not present

## 2016-07-04 DIAGNOSIS — T82858D Stenosis of vascular prosthetic devices, implants and grafts, subsequent encounter: Secondary | ICD-10-CM | POA: Diagnosis not present

## 2016-07-05 DIAGNOSIS — N2581 Secondary hyperparathyroidism of renal origin: Secondary | ICD-10-CM | POA: Diagnosis not present

## 2016-07-05 DIAGNOSIS — N186 End stage renal disease: Secondary | ICD-10-CM | POA: Diagnosis not present

## 2016-07-05 DIAGNOSIS — D509 Iron deficiency anemia, unspecified: Secondary | ICD-10-CM | POA: Diagnosis not present

## 2016-07-06 ENCOUNTER — Ambulatory Visit (INDEPENDENT_AMBULATORY_CARE_PROVIDER_SITE_OTHER): Payer: Medicare Other | Admitting: Internal Medicine

## 2016-07-06 ENCOUNTER — Encounter: Payer: Self-pay | Admitting: Internal Medicine

## 2016-07-06 VITALS — BP 136/68 | HR 88 | Temp 98.0°F | Resp 20 | Wt 149.0 lb

## 2016-07-06 DIAGNOSIS — E785 Hyperlipidemia, unspecified: Secondary | ICD-10-CM

## 2016-07-06 DIAGNOSIS — I63311 Cerebral infarction due to thrombosis of right middle cerebral artery: Secondary | ICD-10-CM

## 2016-07-06 DIAGNOSIS — F329 Major depressive disorder, single episode, unspecified: Secondary | ICD-10-CM

## 2016-07-06 DIAGNOSIS — Z992 Dependence on renal dialysis: Secondary | ICD-10-CM

## 2016-07-06 DIAGNOSIS — I10 Essential (primary) hypertension: Secondary | ICD-10-CM | POA: Diagnosis not present

## 2016-07-06 DIAGNOSIS — I158 Other secondary hypertension: Secondary | ICD-10-CM | POA: Diagnosis not present

## 2016-07-06 DIAGNOSIS — R55 Syncope and collapse: Secondary | ICD-10-CM

## 2016-07-06 DIAGNOSIS — N186 End stage renal disease: Secondary | ICD-10-CM

## 2016-07-06 DIAGNOSIS — F32A Depression, unspecified: Secondary | ICD-10-CM

## 2016-07-06 DIAGNOSIS — R21 Rash and other nonspecific skin eruption: Secondary | ICD-10-CM

## 2016-07-06 MED ORDER — NYSTATIN 100000 UNIT/GM EX POWD: CUTANEOUS | 1 refills | Status: AC

## 2016-07-06 MED ORDER — FLUCONAZOLE 150 MG PO TABS: 150.0000 mg | ORAL_TABLET | Freq: Once | ORAL | 0 refills | Status: AC

## 2016-07-06 MED ORDER — NYSTATIN 100000 UNIT/GM EX POWD: CUTANEOUS | 1 refills | Status: DC

## 2016-07-06 NOTE — Patient Instructions (Addendum)
Please take all new medication as prescribed - the diflucan ( 1 pill, 1 time only)  Please take all new medication as prescribed - the powder  Ok to stay off the atenolol as you have  Please continue all other medications as before, and refills have been done if requested.  Please have the pharmacy call with any other refills you may need.  Please continue your efforts at being more active, low cholesterol diet, and weight control.  Please keep your appointments with your specialists as you may have planned  You will be contacted regarding the referral for: cardiology

## 2016-07-06 NOTE — Progress Notes (Signed)
Pre visit review using our clinic review tool, if applicable. No additional management support is needed unless otherwise documented below in the visit note. 

## 2016-07-06 NOTE — Progress Notes (Signed)
Subjective:    Patient ID: [name], male    DOB: Mar 27, 1929, 82 y.o.   MRN: [id number]  HPI  Here to f/u, BP hs been low at 74 sbp at dialysis last wk, still dizzy 3 days ago with an episode near syncope. Has seen neurology  - reduced atenolol to 12.5 mg, but BP has still been on lower side, so wife held it last 2 days, and finally stopped per renal yesterday.   Pt denies chest pain, increased sob or doe, wheezing, orthopnea, PND, increased LE swelling, palpitations, Pt denies new neurological symptoms such as new headache, or facial or extremity weakness or numbness   Pt denies polydipsia, polyuria,.  Pt states overall good compliance with Denies worsening depressive symptoms, suicidal ideation, or panic  Also with itchy red rash getting worse to bilat groin, scrotum and perineal area. Past Medical History:  Diagnosis Date  . ANEMIA-IRON DEFICIENCY 03/09/2008  . Arthritis    cervical spine.   . Complex renal cyst 06/19/2011  . Depression 03/26/2014  . DIVERTICULOSIS, COLON 03/09/2008  . ESRD (end stage renal disease) ([place]) 03/09/2008  . GOUT 03/09/2008  . Hemorrhoids 08/2009   internal.   . History of blood transfusion   . HYPERLIPIDEMIA 03/09/2008  . Hyperparathyroidism ([place])   . HYPERTENSION 03/09/2008  . Prostate cancer [facility]) 2002   Completed external beam radiation 2003.per HPI  . PROSTATE CANCER, HX OF 03/09/2008  . Radiation cystitis 2010.  Marland Kitchen Stroke [facility])    Past Surgical History:  Procedure Laterality Date  . AV FISTULA PLACEMENT Left 01/24/2013   Procedure: INSERTION OF ARTERIOVENOUS (AV) GORE-TEX GRAFT ARM;  Surgeon: Rosetta Posner, MD;  Location: [place];  Service: Vascular;  Laterality: Left;  . COLONOSCOPY N/A 06/17/2015   Procedure: COLONOSCOPY;  Surgeon: Gatha Mayer, MD;  Location: [place];  Service: Endoscopy;  Laterality: N/A;  . ESOPHAGOGASTRODUODENOSCOPY N/A 06/17/2015   Procedure: ESOPHAGOGASTRODUODENOSCOPY (EGD);  Surgeon: Gatha Mayer, MD;  Location: [facility] ENDOSCOPY;   Service: Endoscopy;  Laterality: N/A;  . FLEXIBLE SIGMOIDOSCOPY N/A 06/15/2015   Procedure: FLEXIBLE SIGMOIDOSCOPY;  Surgeon: Gatha Mayer, MD;  Location: [place];  Service: Endoscopy;  Laterality: N/A;  . GIVENS CAPSULE STUDY N/A 07/06/2015   Procedure: GIVENS CAPSULE STUDY;  Surgeon: Milus Banister, MD;  Location: [place];  Service: Endoscopy;  Laterality: N/A;  . JOINT REPLACEMENT     HIP  . rotator cuff repair right  4/08  . s/p left hip replacement  2007   Dr. Percell Miller ortho  . TONSILLECTOMY      reports that he quit smoking about 33 years ago. His smoking use included Cigarettes. He quit after 5.00 years of use. He has never used smokeless tobacco. He reports that he does not drink alcohol or use drugs. family history includes Cancer in his brother and father; Diabetes in his father; Hypertension in his brother and father. No Known Allergies Current Outpatient Prescriptions on File Prior to Visit  Medication Sig Dispense Refill  . allopurinol (ZYLOPRIM) 100 MG tablet Take 1 tablet (100 mg total) by mouth daily. 90 tablet 3  . aspirin 81 MG tablet Take 1 tablet (81 mg total) by mouth daily.    . citalopram (CELEXA) 10 MG tablet Take 1 tablet (10 mg total) by mouth daily. 10 tablet 0  . docusate sodium (COLACE) 100 MG capsule Take 2 capsules (200 mg total) by mouth 2 (two) times daily. 60 capsule 0  . doxercalciferol (HECTOROL) 0.5  MCG capsule Take 1.5 mcg by mouth 3 (three) times a week. MWF    . Multiple Vitamin (MULTIVITAMIN WITH MINERALS) TABS tablet Take 1 tablet by mouth daily.    . Nutritional Supplements (FEEDING SUPPLEMENT, NEPRO CARB STEADY,) LIQD Take 237 mLs by mouth as needed (missed meal during dialysis.). 30 Can 0  . rosuvastatin (CRESTOR) 20 MG tablet Take 1 tablet (20 mg total) by mouth at bedtime. 90 tablet 3   No current facility-administered medications on file prior to visit.    Review of Systems  Constitutional: Negative for unusual diaphoresis or night  sweats HENT: Negative for ear swelling or discharge Eyes: Negative for worsening visual haziness  Respiratory: Negative for choking and stridor.   Gastrointestinal: Negative for distension or worsening eructation Genitourinary: Negative for retention or change in urine volume.  Musculoskeletal: Negative for other MSK pain or swelling Skin: Negative for color change and worsening wound Neurological: Negative for tremors and numbness other than noted  Psychiatric/Behavioral: Negative for decreased concentration or agitation other than above       Objective:   Physical Exam BP 136/68   Pulse 88   Temp 98 F (36.7 C) (Oral)   Resp 20   Wt 149 lb (67.6 kg)   SpO2 99%   BMI 22.00 kg/m  VS noted,  Constitutional: Pt appears in no apparent distress HENT: Head: NCAT.  Right Ear: External ear normal.  Left Ear: External ear normal.  Eyes: . Pupils are equal, round, and reactive to light. Conjunctivae and EOM are normal Neck: Normal range of motion. Neck supple.  Cardiovascular: Normal rate and regular rhythm.   Pulmonary/Chest: Effort normal and breath sounds without rales or wheezing.  Abd:  Soft, NT, ND, + BS Left av fistula + thrill Neurological: Pt is alert. Not confused , motor grossly intact Skin: Skin is warm. + intertrig rash to scrotum, perineum,bilat groin area,, no LE edema Psychiatric: Pt behavior is normal. No agitation. not depressed affect    Assessment & Plan:

## 2016-07-07 DIAGNOSIS — N186 End stage renal disease: Secondary | ICD-10-CM | POA: Diagnosis not present

## 2016-07-07 DIAGNOSIS — N2581 Secondary hyperparathyroidism of renal origin: Secondary | ICD-10-CM | POA: Diagnosis not present

## 2016-07-07 DIAGNOSIS — D509 Iron deficiency anemia, unspecified: Secondary | ICD-10-CM | POA: Diagnosis not present

## 2016-07-07 DIAGNOSIS — D631 Anemia in chronic kidney disease: Secondary | ICD-10-CM | POA: Diagnosis not present

## 2016-07-09 DIAGNOSIS — R21 Rash and other nonspecific skin eruption: Secondary | ICD-10-CM | POA: Insufficient documentation

## 2016-07-09 NOTE — Assessment & Plan Note (Signed)
stable overall by history and exam, recent data reviewed with pt, and pt to continue medical treatment as before,  to f/u any worsening symptoms or concerns Lab Results  Component Value Date   LDLCALC 98 07/05/2015

## 2016-07-09 NOTE — Assessment & Plan Note (Signed)
stable overall by history and exam, recent data reviewed with pt, and pt to continue medical treatment as before,  to f/u any worsening symptoms or concerns Lab Results  Component Value Date   WBC 5.9 02/29/2016   HGB 9.7 (L) 02/29/2016   HCT 31.8 (L) 02/29/2016   PLT 121 (L) 02/29/2016   GLUCOSE 86 02/29/2016   CHOL 188 07/05/2015   TRIG 123 07/05/2015   HDL 65 07/05/2015   LDLCALC 98 07/05/2015   ALT 15 (L) 12/07/2015   AST 21 12/07/2015   NA 141 02/29/2016   K 4.1 02/29/2016   CL 103 02/29/2016   CREATININE 6.48 (H) 02/29/2016   BUN 23 (H) 02/29/2016   CO2 26 02/29/2016   TSH 0.551 07/05/2015   INR 1.36 12/06/2015   HGBA1C 4.9 07/05/2015

## 2016-07-09 NOTE — Assessment & Plan Note (Signed)
C/w intertrigo, for diflucan po x 1, and nystatin powder bid prn,  to f/u any worsening symptoms or concerns

## 2016-07-09 NOTE — Assessment & Plan Note (Signed)
stable overall by history and exam, recent data reviewed with pt, and pt to continue medical treatment as before,  to f/u any worsening symptoms or concerns BP Readings from Last 3 Encounters:  07/06/16 136/68  03/07/16 (!) 112/55  02/29/16 136/65

## 2016-07-09 NOTE — Assessment & Plan Note (Signed)
Also for cardiology evaluation given recent syncope,  to f/u any worsening symptoms or concerns

## 2016-07-09 NOTE — Assessment & Plan Note (Signed)
Cont's HD M-W-F,  to f/u any worsening symptoms or concerns

## 2016-07-10 DIAGNOSIS — D631 Anemia in chronic kidney disease: Secondary | ICD-10-CM | POA: Diagnosis not present

## 2016-07-10 DIAGNOSIS — N186 End stage renal disease: Secondary | ICD-10-CM | POA: Diagnosis not present

## 2016-07-10 DIAGNOSIS — N2581 Secondary hyperparathyroidism of renal origin: Secondary | ICD-10-CM | POA: Diagnosis not present

## 2016-07-10 DIAGNOSIS — D509 Iron deficiency anemia, unspecified: Secondary | ICD-10-CM | POA: Diagnosis not present

## 2016-07-12 DIAGNOSIS — N186 End stage renal disease: Secondary | ICD-10-CM | POA: Diagnosis not present

## 2016-07-12 DIAGNOSIS — D631 Anemia in chronic kidney disease: Secondary | ICD-10-CM | POA: Diagnosis not present

## 2016-07-12 DIAGNOSIS — D509 Iron deficiency anemia, unspecified: Secondary | ICD-10-CM | POA: Diagnosis not present

## 2016-07-12 DIAGNOSIS — N2581 Secondary hyperparathyroidism of renal origin: Secondary | ICD-10-CM | POA: Diagnosis not present

## 2016-07-14 DIAGNOSIS — N2581 Secondary hyperparathyroidism of renal origin: Secondary | ICD-10-CM | POA: Diagnosis not present

## 2016-07-14 DIAGNOSIS — D509 Iron deficiency anemia, unspecified: Secondary | ICD-10-CM | POA: Diagnosis not present

## 2016-07-14 DIAGNOSIS — D631 Anemia in chronic kidney disease: Secondary | ICD-10-CM | POA: Diagnosis not present

## 2016-07-14 DIAGNOSIS — N186 End stage renal disease: Secondary | ICD-10-CM | POA: Diagnosis not present

## 2016-07-17 DIAGNOSIS — D509 Iron deficiency anemia, unspecified: Secondary | ICD-10-CM | POA: Diagnosis not present

## 2016-07-17 DIAGNOSIS — N2581 Secondary hyperparathyroidism of renal origin: Secondary | ICD-10-CM | POA: Diagnosis not present

## 2016-07-17 DIAGNOSIS — D631 Anemia in chronic kidney disease: Secondary | ICD-10-CM | POA: Diagnosis not present

## 2016-07-17 DIAGNOSIS — N186 End stage renal disease: Secondary | ICD-10-CM | POA: Diagnosis not present

## 2016-07-18 ENCOUNTER — Encounter: Payer: Self-pay | Admitting: Cardiovascular Disease

## 2016-07-18 ENCOUNTER — Ambulatory Visit (INDEPENDENT_AMBULATORY_CARE_PROVIDER_SITE_OTHER): Payer: Medicare Other | Admitting: Cardiovascular Disease

## 2016-07-18 VITALS — BP 106/50 | HR 90 | Ht 69.0 in | Wt 147.0 lb

## 2016-07-18 DIAGNOSIS — I63311 Cerebral infarction due to thrombosis of right middle cerebral artery: Secondary | ICD-10-CM

## 2016-07-18 DIAGNOSIS — E785 Hyperlipidemia, unspecified: Secondary | ICD-10-CM

## 2016-07-18 DIAGNOSIS — R55 Syncope and collapse: Secondary | ICD-10-CM | POA: Diagnosis not present

## 2016-07-18 NOTE — Assessment & Plan Note (Signed)
History of hypertension in the past on atenolol which has since been discontinued because of low blood pressure especially in the setting of dialysis. His blood pressure today is 106/50. He is not on any hypertensive medications.

## 2016-07-18 NOTE — Patient Instructions (Signed)
Medication Instructions:  Continue current medications  Labwork: None Ordered  Testing/Procedures: Your physician has recommended that you wear an event monitor for 30 days. Event monitors are medical devices that record the heart's electrical activity. Doctors most often [place] these monitors to diagnose arrhythmias. Arrhythmias are problems with the speed or rhythm of the heartbeat. The monitor is a small, portable device. You can wear one while you do your normal daily activities. This is usually used to diagnose what is causing palpitations/syncope (passing out).   Follow-Up: Your physician recommends that you schedule a follow-up appointment in: As Needed   Any Other Special Instructions Will Be Listed Below (If Applicable).   If you need a refill on your cardiac medications before your next appointment, please call your pharmacy.

## 2016-07-18 NOTE — Assessment & Plan Note (Signed)
The patient was referred for evaluation of dizziness and syncope. He has been on hemodialysis for 3 years. His wife is a retired Marine scientist. He said multiple episodes of dizziness and several of syncope. These have been in the setting of hypotension, post dialysis and severe anemia. A 2-D echo performed earlier this year was completely normal. I'm going to get a 30 day event monitor to rule out an arrhythmogenic cause.

## 2016-07-18 NOTE — Progress Notes (Signed)
07/18/2016 [name]   1929-09-27  [id number]  Primary Physician [name], MD Primary Cardiologist: Lorretta Harp MD Renae Gloss  HPI:  Mr. [name] is a delightful 82 year old thin appearing married African-American male father of one, grandfather and 2 grandchildren was accompanied by his wife [name] who  is retired Marine scientist with over 37 years. Mr. Ayler retired from [employer] many years ago. His primary care provider is Dr. [name]. He has a history of hyperlipidemia as well as hypertension the past though longer on antihypertensive medications. He has never had a heart attack or stroke. He denies chest pain or shortness of breath. He has been on dialysis for the last 3 years. He has had episodes of dizziness as well as several episodes of syncope that occurred in the setting of hypotension, post dialysis and severe anemia. His wife was witnessed these episodes since there was no seizure activity.   Current Outpatient Prescriptions  Medication Sig Dispense Refill  . allopurinol (ZYLOPRIM) 100 MG tablet Take 1 tablet (100 mg total) by mouth daily. 90 tablet 3  . aspirin 81 MG tablet Take 1 tablet (81 mg total) by mouth daily.    . citalopram (CELEXA) 10 MG tablet Take 1 tablet (10 mg total) by mouth daily. 10 tablet 0  . docusate sodium (COLACE) 100 MG capsule Take 2 capsules (200 mg total) by mouth 2 (two) times daily. 60 capsule 0  . doxercalciferol (HECTOROL) 0.5 MCG capsule Take 1.5 mcg by mouth 3 (three) times a week. MWF    . Multiple Vitamin (MULTIVITAMIN WITH MINERALS) TABS tablet Take 1 tablet by mouth daily.    Marland Kitchen nystatin (MYCOSTATIN/NYSTOP) powder Use as directed twice per day 45 g 1  . rosuvastatin (CRESTOR) 20 MG tablet Take 1 tablet (20 mg total) by mouth at bedtime. 90 tablet 3   No current facility-administered medications for this visit.     No Known Allergies  Social History   Social History  . Marital status: Married    Spouse name: N/A    . Number of children: 2  . Years of education: N/A   Occupational History  . retired Print production planner    Social History Main Topics  . Smoking status: Former Smoker    Years: 5.00    Types: Cigarettes    Quit date: 09/17/1982  . Smokeless tobacco: Never Used  . Alcohol use No     Comment: rare  . Drug use: No  . Sexual activity: Not on file   Other Topics Concern  . Not on file   Social History Narrative  . No narrative on file     Review of Systems: General: negative for chills, fever, night sweats or weight changes.  Cardiovascular: negative for chest pain, dyspnea on exertion, edema, orthopnea, palpitations, paroxysmal nocturnal dyspnea or shortness of breath Dermatological: negative for rash Respiratory: negative for cough or wheezing Urologic: negative for hematuria Abdominal: negative for nausea, vomiting, diarrhea, bright red blood per rectum, melena, or hematemesis Neurologic: negative for visual changes, syncope, or dizziness All other systems reviewed and are otherwise negative except as noted above.    Blood pressure (!) 106/50, pulse 90, height 5\' 9"  (1.753 m), weight 147 lb (66.7 kg).  General appearance: alert and no distress Neck: no adenopathy, no carotid bruit, no JVD, supple, symmetrical, trachea midline and thyroid not enlarged, symmetric, no tenderness/mass/nodules Lungs: clear to auscultation bilaterally Heart: regular rate and rhythm, S1, S2 normal,  no murmur, click, rub or gallop Extremities: extremities normal, atraumatic, no cyanosis or edema  EKG normal sinus rhythm at 90 with left axis deviation and a occasional PVC with septal Q waves. I personally reviewed this EKG  ASSESSMENT AND PLAN:   Essential hypertension History of hypertension in the past on atenolol which has since been discontinued because of low blood pressure especially in the setting of dialysis. His blood pressure today is 106/50. He is not on any hypertensive  medications.  HLD (hyperlipidemia) History of hyperlipidemia on statin therapy with recent lipid profile performed 07/05/15 revealing total cholesterol 188, LDL 98 and HDL of 65  Syncope The patient was referred for evaluation of dizziness and syncope. He has been on hemodialysis for 3 years. His wife is a retired Marine scientist. He said multiple episodes of dizziness and several of syncope. These have been in the setting of hypotension, post dialysis and severe anemia. A 2-D echo performed earlier this year was completely normal. I'm going to get a 30 day event monitor to rule out an arrhythmogenic cause.      Lorretta Harp MD FACP,FACC,FAHA, [facility] 07/18/2016 10:10 AM

## 2016-07-18 NOTE — Assessment & Plan Note (Signed)
History of hyperlipidemia on statin therapy with recent lipid profile performed 07/05/15 revealing total cholesterol 188, LDL 98 and HDL of 65

## 2016-07-19 DIAGNOSIS — N2581 Secondary hyperparathyroidism of renal origin: Secondary | ICD-10-CM | POA: Diagnosis not present

## 2016-07-19 DIAGNOSIS — N186 End stage renal disease: Secondary | ICD-10-CM | POA: Diagnosis not present

## 2016-07-19 DIAGNOSIS — D509 Iron deficiency anemia, unspecified: Secondary | ICD-10-CM | POA: Diagnosis not present

## 2016-07-19 DIAGNOSIS — D631 Anemia in chronic kidney disease: Secondary | ICD-10-CM | POA: Diagnosis not present

## 2016-07-21 DIAGNOSIS — D631 Anemia in chronic kidney disease: Secondary | ICD-10-CM | POA: Diagnosis not present

## 2016-07-21 DIAGNOSIS — N2581 Secondary hyperparathyroidism of renal origin: Secondary | ICD-10-CM | POA: Diagnosis not present

## 2016-07-21 DIAGNOSIS — D509 Iron deficiency anemia, unspecified: Secondary | ICD-10-CM | POA: Diagnosis not present

## 2016-07-21 DIAGNOSIS — N186 End stage renal disease: Secondary | ICD-10-CM | POA: Diagnosis not present

## 2016-07-24 DIAGNOSIS — N186 End stage renal disease: Secondary | ICD-10-CM | POA: Diagnosis not present

## 2016-07-24 DIAGNOSIS — D631 Anemia in chronic kidney disease: Secondary | ICD-10-CM | POA: Diagnosis not present

## 2016-07-24 DIAGNOSIS — D509 Iron deficiency anemia, unspecified: Secondary | ICD-10-CM | POA: Diagnosis not present

## 2016-07-24 DIAGNOSIS — N2581 Secondary hyperparathyroidism of renal origin: Secondary | ICD-10-CM | POA: Diagnosis not present

## 2016-07-25 ENCOUNTER — Ambulatory Visit (INDEPENDENT_AMBULATORY_CARE_PROVIDER_SITE_OTHER): Payer: Medicare Other

## 2016-07-25 DIAGNOSIS — R55 Syncope and collapse: Secondary | ICD-10-CM | POA: Diagnosis not present

## 2016-07-26 DIAGNOSIS — N2581 Secondary hyperparathyroidism of renal origin: Secondary | ICD-10-CM | POA: Diagnosis not present

## 2016-07-26 DIAGNOSIS — N186 End stage renal disease: Secondary | ICD-10-CM | POA: Diagnosis not present

## 2016-07-26 DIAGNOSIS — D509 Iron deficiency anemia, unspecified: Secondary | ICD-10-CM | POA: Diagnosis not present

## 2016-07-26 DIAGNOSIS — D631 Anemia in chronic kidney disease: Secondary | ICD-10-CM | POA: Diagnosis not present

## 2016-07-28 DIAGNOSIS — N186 End stage renal disease: Secondary | ICD-10-CM | POA: Diagnosis not present

## 2016-07-28 DIAGNOSIS — N2581 Secondary hyperparathyroidism of renal origin: Secondary | ICD-10-CM | POA: Diagnosis not present

## 2016-07-28 DIAGNOSIS — D509 Iron deficiency anemia, unspecified: Secondary | ICD-10-CM | POA: Diagnosis not present

## 2016-07-28 DIAGNOSIS — D631 Anemia in chronic kidney disease: Secondary | ICD-10-CM | POA: Diagnosis not present

## 2016-07-31 DIAGNOSIS — N2581 Secondary hyperparathyroidism of renal origin: Secondary | ICD-10-CM | POA: Diagnosis not present

## 2016-07-31 DIAGNOSIS — D509 Iron deficiency anemia, unspecified: Secondary | ICD-10-CM | POA: Diagnosis not present

## 2016-07-31 DIAGNOSIS — D631 Anemia in chronic kidney disease: Secondary | ICD-10-CM | POA: Diagnosis not present

## 2016-07-31 DIAGNOSIS — N186 End stage renal disease: Secondary | ICD-10-CM | POA: Diagnosis not present

## 2016-08-02 DIAGNOSIS — D509 Iron deficiency anemia, unspecified: Secondary | ICD-10-CM | POA: Diagnosis not present

## 2016-08-02 DIAGNOSIS — D631 Anemia in chronic kidney disease: Secondary | ICD-10-CM | POA: Diagnosis not present

## 2016-08-02 DIAGNOSIS — N2581 Secondary hyperparathyroidism of renal origin: Secondary | ICD-10-CM | POA: Diagnosis not present

## 2016-08-02 DIAGNOSIS — N186 End stage renal disease: Secondary | ICD-10-CM | POA: Diagnosis not present

## 2016-08-04 DIAGNOSIS — D631 Anemia in chronic kidney disease: Secondary | ICD-10-CM | POA: Diagnosis not present

## 2016-08-04 DIAGNOSIS — D509 Iron deficiency anemia, unspecified: Secondary | ICD-10-CM | POA: Diagnosis not present

## 2016-08-04 DIAGNOSIS — N2581 Secondary hyperparathyroidism of renal origin: Secondary | ICD-10-CM | POA: Diagnosis not present

## 2016-08-04 DIAGNOSIS — N186 End stage renal disease: Secondary | ICD-10-CM | POA: Diagnosis not present

## 2016-08-05 DIAGNOSIS — Z992 Dependence on renal dialysis: Secondary | ICD-10-CM | POA: Diagnosis not present

## 2016-08-05 DIAGNOSIS — N186 End stage renal disease: Secondary | ICD-10-CM | POA: Diagnosis not present

## 2016-08-05 DIAGNOSIS — I158 Other secondary hypertension: Secondary | ICD-10-CM | POA: Diagnosis not present

## 2016-08-06 ENCOUNTER — Other Ambulatory Visit: Payer: Self-pay | Admitting: Internal Medicine

## 2016-08-07 DIAGNOSIS — D631 Anemia in chronic kidney disease: Secondary | ICD-10-CM | POA: Diagnosis not present

## 2016-08-07 DIAGNOSIS — N2581 Secondary hyperparathyroidism of renal origin: Secondary | ICD-10-CM | POA: Diagnosis not present

## 2016-08-07 DIAGNOSIS — D509 Iron deficiency anemia, unspecified: Secondary | ICD-10-CM | POA: Diagnosis not present

## 2016-08-07 DIAGNOSIS — N186 End stage renal disease: Secondary | ICD-10-CM | POA: Diagnosis not present

## 2016-08-07 MED ORDER — CITALOPRAM HYDROBROMIDE 10 MG PO TABS: 10.0000 mg | ORAL_TABLET | Freq: Every day | ORAL | 3 refills | Status: DC

## 2016-08-09 DIAGNOSIS — D509 Iron deficiency anemia, unspecified: Secondary | ICD-10-CM | POA: Diagnosis not present

## 2016-08-09 DIAGNOSIS — N186 End stage renal disease: Secondary | ICD-10-CM | POA: Diagnosis not present

## 2016-08-09 DIAGNOSIS — D631 Anemia in chronic kidney disease: Secondary | ICD-10-CM | POA: Diagnosis not present

## 2016-08-09 DIAGNOSIS — N2581 Secondary hyperparathyroidism of renal origin: Secondary | ICD-10-CM | POA: Diagnosis not present

## 2016-08-11 DIAGNOSIS — N186 End stage renal disease: Secondary | ICD-10-CM | POA: Diagnosis not present

## 2016-08-11 DIAGNOSIS — N2581 Secondary hyperparathyroidism of renal origin: Secondary | ICD-10-CM | POA: Diagnosis not present

## 2016-08-11 DIAGNOSIS — D509 Iron deficiency anemia, unspecified: Secondary | ICD-10-CM | POA: Diagnosis not present

## 2016-08-11 DIAGNOSIS — D631 Anemia in chronic kidney disease: Secondary | ICD-10-CM | POA: Diagnosis not present

## 2016-08-14 DIAGNOSIS — D631 Anemia in chronic kidney disease: Secondary | ICD-10-CM | POA: Diagnosis not present

## 2016-08-14 DIAGNOSIS — N2581 Secondary hyperparathyroidism of renal origin: Secondary | ICD-10-CM | POA: Diagnosis not present

## 2016-08-14 DIAGNOSIS — D509 Iron deficiency anemia, unspecified: Secondary | ICD-10-CM | POA: Diagnosis not present

## 2016-08-14 DIAGNOSIS — N186 End stage renal disease: Secondary | ICD-10-CM | POA: Diagnosis not present

## 2016-08-16 DIAGNOSIS — D631 Anemia in chronic kidney disease: Secondary | ICD-10-CM | POA: Diagnosis not present

## 2016-08-16 DIAGNOSIS — N186 End stage renal disease: Secondary | ICD-10-CM | POA: Diagnosis not present

## 2016-08-16 DIAGNOSIS — N2581 Secondary hyperparathyroidism of renal origin: Secondary | ICD-10-CM | POA: Diagnosis not present

## 2016-08-16 DIAGNOSIS — D509 Iron deficiency anemia, unspecified: Secondary | ICD-10-CM | POA: Diagnosis not present

## 2016-08-18 DIAGNOSIS — D509 Iron deficiency anemia, unspecified: Secondary | ICD-10-CM | POA: Diagnosis not present

## 2016-08-18 DIAGNOSIS — N186 End stage renal disease: Secondary | ICD-10-CM | POA: Diagnosis not present

## 2016-08-18 DIAGNOSIS — N2581 Secondary hyperparathyroidism of renal origin: Secondary | ICD-10-CM | POA: Diagnosis not present

## 2016-08-18 DIAGNOSIS — D631 Anemia in chronic kidney disease: Secondary | ICD-10-CM | POA: Diagnosis not present

## 2016-08-21 DIAGNOSIS — D509 Iron deficiency anemia, unspecified: Secondary | ICD-10-CM | POA: Diagnosis not present

## 2016-08-21 DIAGNOSIS — N186 End stage renal disease: Secondary | ICD-10-CM | POA: Diagnosis not present

## 2016-08-21 DIAGNOSIS — N2581 Secondary hyperparathyroidism of renal origin: Secondary | ICD-10-CM | POA: Diagnosis not present

## 2016-08-21 DIAGNOSIS — D631 Anemia in chronic kidney disease: Secondary | ICD-10-CM | POA: Diagnosis not present

## 2016-08-23 DIAGNOSIS — N186 End stage renal disease: Secondary | ICD-10-CM | POA: Diagnosis not present

## 2016-08-23 DIAGNOSIS — D631 Anemia in chronic kidney disease: Secondary | ICD-10-CM | POA: Diagnosis not present

## 2016-08-23 DIAGNOSIS — D509 Iron deficiency anemia, unspecified: Secondary | ICD-10-CM | POA: Diagnosis not present

## 2016-08-23 DIAGNOSIS — N2581 Secondary hyperparathyroidism of renal origin: Secondary | ICD-10-CM | POA: Diagnosis not present

## 2016-08-25 DIAGNOSIS — D509 Iron deficiency anemia, unspecified: Secondary | ICD-10-CM | POA: Diagnosis not present

## 2016-08-25 DIAGNOSIS — N2581 Secondary hyperparathyroidism of renal origin: Secondary | ICD-10-CM | POA: Diagnosis not present

## 2016-08-25 DIAGNOSIS — N186 End stage renal disease: Secondary | ICD-10-CM | POA: Diagnosis not present

## 2016-08-25 DIAGNOSIS — D631 Anemia in chronic kidney disease: Secondary | ICD-10-CM | POA: Diagnosis not present

## 2016-08-28 DIAGNOSIS — N2581 Secondary hyperparathyroidism of renal origin: Secondary | ICD-10-CM | POA: Diagnosis not present

## 2016-08-28 DIAGNOSIS — D631 Anemia in chronic kidney disease: Secondary | ICD-10-CM | POA: Diagnosis not present

## 2016-08-28 DIAGNOSIS — D509 Iron deficiency anemia, unspecified: Secondary | ICD-10-CM | POA: Diagnosis not present

## 2016-08-28 DIAGNOSIS — N186 End stage renal disease: Secondary | ICD-10-CM | POA: Diagnosis not present

## 2016-08-29 DIAGNOSIS — H903 Sensorineural hearing loss, bilateral: Secondary | ICD-10-CM | POA: Diagnosis not present

## 2016-08-29 DIAGNOSIS — H6123 Impacted cerumen, bilateral: Secondary | ICD-10-CM | POA: Diagnosis not present

## 2016-08-30 DIAGNOSIS — N2581 Secondary hyperparathyroidism of renal origin: Secondary | ICD-10-CM | POA: Diagnosis not present

## 2016-08-30 DIAGNOSIS — N186 End stage renal disease: Secondary | ICD-10-CM | POA: Diagnosis not present

## 2016-08-30 DIAGNOSIS — D631 Anemia in chronic kidney disease: Secondary | ICD-10-CM | POA: Diagnosis not present

## 2016-08-30 DIAGNOSIS — D509 Iron deficiency anemia, unspecified: Secondary | ICD-10-CM | POA: Diagnosis not present

## 2016-09-01 DIAGNOSIS — N186 End stage renal disease: Secondary | ICD-10-CM | POA: Diagnosis not present

## 2016-09-01 DIAGNOSIS — D631 Anemia in chronic kidney disease: Secondary | ICD-10-CM | POA: Diagnosis not present

## 2016-09-01 DIAGNOSIS — D509 Iron deficiency anemia, unspecified: Secondary | ICD-10-CM | POA: Diagnosis not present

## 2016-09-01 DIAGNOSIS — N2581 Secondary hyperparathyroidism of renal origin: Secondary | ICD-10-CM | POA: Diagnosis not present

## 2016-09-04 DIAGNOSIS — N2581 Secondary hyperparathyroidism of renal origin: Secondary | ICD-10-CM | POA: Diagnosis not present

## 2016-09-04 DIAGNOSIS — D509 Iron deficiency anemia, unspecified: Secondary | ICD-10-CM | POA: Diagnosis not present

## 2016-09-04 DIAGNOSIS — D631 Anemia in chronic kidney disease: Secondary | ICD-10-CM | POA: Diagnosis not present

## 2016-09-04 DIAGNOSIS — N186 End stage renal disease: Secondary | ICD-10-CM | POA: Diagnosis not present

## 2016-09-05 DIAGNOSIS — Z992 Dependence on renal dialysis: Secondary | ICD-10-CM | POA: Diagnosis not present

## 2016-09-05 DIAGNOSIS — I158 Other secondary hypertension: Secondary | ICD-10-CM | POA: Diagnosis not present

## 2016-09-05 DIAGNOSIS — N186 End stage renal disease: Secondary | ICD-10-CM | POA: Diagnosis not present

## 2016-09-06 DIAGNOSIS — N2581 Secondary hyperparathyroidism of renal origin: Secondary | ICD-10-CM | POA: Diagnosis not present

## 2016-09-06 DIAGNOSIS — D509 Iron deficiency anemia, unspecified: Secondary | ICD-10-CM | POA: Diagnosis not present

## 2016-09-06 DIAGNOSIS — N186 End stage renal disease: Secondary | ICD-10-CM | POA: Diagnosis not present

## 2016-09-06 DIAGNOSIS — D631 Anemia in chronic kidney disease: Secondary | ICD-10-CM | POA: Diagnosis not present

## 2016-09-07 ENCOUNTER — Ambulatory Visit (INDEPENDENT_AMBULATORY_CARE_PROVIDER_SITE_OTHER): Payer: Medicare Other | Admitting: Neurology

## 2016-09-07 ENCOUNTER — Encounter: Payer: Self-pay | Admitting: Neurology

## 2016-09-07 VITALS — BP 98/49 | HR 94 | Wt 151.2 lb

## 2016-09-07 DIAGNOSIS — I951 Orthostatic hypotension: Secondary | ICD-10-CM

## 2016-09-07 DIAGNOSIS — N186 End stage renal disease: Secondary | ICD-10-CM

## 2016-09-07 DIAGNOSIS — Z992 Dependence on renal dialysis: Secondary | ICD-10-CM

## 2016-09-07 DIAGNOSIS — Z8719 Personal history of other diseases of the digestive system: Secondary | ICD-10-CM | POA: Diagnosis not present

## 2016-09-07 DIAGNOSIS — R55 Syncope and collapse: Secondary | ICD-10-CM

## 2016-09-07 DIAGNOSIS — I63311 Cerebral infarction due to thrombosis of right middle cerebral artery: Secondary | ICD-10-CM

## 2016-09-07 MED ORDER — MIDODRINE HCL 5 MG PO TABS: 5.0000 mg | ORAL_TABLET | Freq: Three times a day (TID) | ORAL | 3 refills | Status: DC

## 2016-09-07 NOTE — Patient Instructions (Addendum)
-   Continue ASA and crestor for stroke prevention - will prescribe midodrine 5mg  three times a day, take in the morning, lunch time and 4pm. Do not take after 4pm.  - check BP at home 2-3 times a day with lying, sitting and standing to see if we need to increase or decrease the dose  - BP goal 110-130 - Follow up with your primary care physician for stroke risk factor modification. Recommend maintain blood pressure goal 110-130/80, diabetes with hemoglobin A1c goal below 6.5% and lipids with LDL cholesterol goal below 70 mg/dL.  - avoid driving on dialysis days and for all other days take BP before driving. BP goal 110-130.  - follow up in 3 months

## 2016-09-07 NOTE — Progress Notes (Signed)
STROKE NEUROLOGY FOLLOW UP NOTE  NAME: [name] DOB: 1929-04-14  REASON FOR VISIT: stroke follow up HISTORY FROM: wife and chart  Today we had the pleasure of seeing [name] in follow-up at our Neurology Clinic. Pt was accompanied by wife.   History Summary [name] is a 82 y.o. male with history of ESRD on hemodialysis, colonic diverticulosis with LGIB requiring blood transfusion, hypertension and hyperlipidemia was admitted on 07/05/15 for left facial droop, weakness and fatigue after a brief episode of loss of consciousness. MRI showed left centrum semiovale subcortical infarct, likely due to small vessel disease. Stroke work up including TCD, TTE, CUS, venous doppler UE and LE, A1C all negative. LDL 98. Pt had several episodes of GIB with anemia requiring blood transfusion so that ASA was on hold initially. EGD/colonoscopy on 8/12 was negative, and Capsule Endoscopy negative as well. GI suspects a stuttering diverticular bleed. ASA was resumed after GI agreement. Pt was discharged with ASA and crestor.  09/09/15 follow up - the patient has been doing well. No more GIB. Continues on ASA without issue. His BP at home 112-114 but today 98/50. Pt asymptomatic. Denies any dizziness or feeling weak. atenolol discontinued so far. Continues to have HD for ESRD. Asking for driving.    03/07/16 follow up - he was doing well. He went to ER on 02/28/16 for episode of passing out. He had HD that day in the morning. In the evening, he was having shower, he bent down to get shower material and when he got up, he passed out and fall. Wife got him out of shower and put him in supine position, he gradually recovered. EMS checked his orthostatic vital showed difference of BPs and sent him to ER. He was conscious and no confusion or post ictal with EMS. In ER BP recovered and he was sent back home. As per wife, he is taking atenolol 25mg  on dialysis days and 50mg  other days. BP usually 100-110 at  home. Today BP 112/55.  Interval History During the interval time, pt has been doing well. Followed with cardiology for syncope management. Took off all BP meds and just had 30 day cardiac event monitoring, report pending. However, his BP still low. Today not his dialysis day BP 98/49. Wife said sometimes with dialysis BP down to 70s. Sometimes he stand up from chair having pre-syncope like condition, staring off and imbalance. No other complains.   REVIEW OF SYSTEMS: Full 14 system review of systems performed and notable only for those listed below and in HPI above, all others are negative:  Constitutional:   Cardiovascular:  Ear/Nose/Throat:  Hearing loss Skin:  Eyes:   Respiratory:   Gastroitestinal:   Genitourinary:  Hematology/Lymphatic:   Endocrine:  Musculoskeletal:   Allergy/Immunology:   Neurological:   Psychiatric:  Sleep:   The following represents the patient's updated allergies and side effects list: No Known Allergies  The neurologically relevant items on the patient's problem list were reviewed on today's visit.  Neurologic Examination  A problem focused neurological exam (12 or more points of the single system neurologic examination, vital signs counts as 1 point, cranial nerves count for 8 points) was performed.  Blood pressure (!) 98/49, pulse 94, weight 151 lb 3.2 oz (68.6 kg).  General - Well nourished, well developed, in no apparent distress.  Ophthalmologic - Fundi not visualized due to eye movement.  Cardiovascular - Regular rate and rhythm.  Mental Status -  Level of  arousal and orientation to month, place, and person were intact, but not to year. Language including expression, naming, repetition, comprehension was assessed and found intact. Fund of Knowledge was assessed and was intact impaired.  Cranial Nerves II - XII - II - Visual field intact OU. III, IV, VI - Extraocular movements intact. V - Facial sensation intact bilaterally. VII -  facial symmetrical VIII - Hearing & vestibular intact bilaterally. X - Palate elevates symmetrically. XI - Chin turning & shoulder shrug intact bilaterally. XII - Tongue protrusion intact.  Motor Strength - The patient's strength was normal in all extremities and pronator drift was absent.  Bulk was normal and fasciculations were absent.   Motor Tone - Muscle tone was assessed at the neck and appendages and was normal.  Reflexes - The patient's reflexes were 1+ in all extremities and he had no pathological reflexes.  Sensory - Light touch, temperature/pinprick were assessed and were normal.    Coordination - The patient had normal movements in the hands and feet with no ataxia or dysmetria.  Tremor was absent.  Gait and Station - walk with cane and stooped posturing, slow and small stride.  Data reviewed: I personally reviewed the images and agree with the radiology interpretations.  Mr Brain Wo Contrast 07/05/2015 1 x 1 x 2 cm area of nonhemorrhagic acute infarction affecting the subcortical and periventricular deep white matter of the RIGHT centrum semiovale. Chronic changes as described.   CUS - Bilateral: 1-39% ICA stenosis. Vertebral artery flow is antegrade.  UE venous doppler - Bilateral: No evidence of DVT or superficial thrombosis. Left: Hemodialysis graft is patent, however, there is not complete color fill. No echoes within the graft to suggest thrombus.   LE venous doppler - negative for DVT bilaterally  2D echo - Left ventricle: The cavity size was normal. Systolic function washyperdynamic. The estimated ejection fraction was in the range of70% to 75%. There was dynamic obstruction in the mid cavity, witha peak velocity of 200 cm/sec and a peak gradient of 16 mm Hg.Wall motion was normal; there were no regional wall motionabnormalities. There was an increased relative contribution ofatrial contraction to ventricular filling, which may be due  tohypovolemia. - Aortic valve: Valve area (VTI): 2.73 cm^2. Valve area (Vmax):2.05 cm^2. Valve area (Vmean): 2.14 cm^2. - Right ventricle: Systolic function was hyperdynamic. - Pulmonary arteries: PA peak pressure: 33 mm Hg (S). - Systemic veins: The inferior vena cava is collapsed. Impressions: Findings are consistent with hypovolemia.  TCD - This was a normal transcranial Doppler study, with normal flow direction and velocity of all identified vessels of the anterior and posterior circulations, with no evidence of stenosis, vasospasm or occlusion. There was no evidence of intracranial disease.  Component     Latest Ref Rng 07/05/2015  Cholesterol     0 - 200 mg/dL 188  Triglycerides     <150 mg/dL 123  HDL Cholesterol     >40 mg/dL 65  Total CHOL/HDL Ratio      2.9  VLDL     0 - 40 mg/dL 25  LDL (calc)     0 - 99 mg/dL 98  Hemoglobin A1C     4.8 - 5.6 % 4.9  Mean Plasma Glucose      94  TSH     0.350 - 4.500 uIU/mL 0.551  Free T4     0.61 - 1.12 ng/dL 1.00    Assessment: As you may recall, he is a 82 y.o. African American  male with PMH of ESRD on hemodialysis, colonic diverticulosis with LGIB requiring blood transfusion, hypertension and hyperlipidemia was admitted on 07/05/15 for left centrum semiovale subcortical infarct, likely due to small vessel disease. Stroke work up including TCD, TTE, CUS, venous doppler UE and LE, A1C all negative. LDL 98. Pt had GIB with anemia requiring blood transfusion so that ASA was on hold initially. GI work up negative and ASA was resumed after GI agreement. Pt was discharged with ASA and crestor. During the interval time, no GIB. On ASA without issue. BP at low side but asymptomatic. However, he had syncope episode on 02/28/16. BP borderline low and positive orthostatic. No seizure or stroke. He is off all BP meds now, but BP still low, even lower on dialysis days. 30 day cardiac monitoring pending report. Will start midodrine.   Plan:  -  Continue ASA and crestor for stroke prevention - will prescribe midodrine 5mg  three times a day, take in the morning, lunch time and 4pm. Do not take after 4pm.  - check BP at home 2-3 times a day with lying, sitting and standing to see if we need to increase or decrease the dose  - BP goal 110-130 - Follow up with your primary care physician for stroke risk factor modification. Recommend maintain blood pressure goal 110-130/80, diabetes with hemoglobin A1c goal below 6.5% and lipids with LDL cholesterol goal below 70 mg/dL.  - avoid driving on dialysis days and for all other days take BP before driving. BP goal 110-130.  - follow up in 3 months  I spent more than 25 minutes of face to face time with the patient. Greater than 50% of time was spent in counseling and coordination of care. We have discussed about BP management, BP check at home and new medication.   No orders of the defined types were placed in this encounter.   Meds ordered this encounter  Medications  . midodrine (PROAMATINE) 5 MG tablet    Sig: Take 1 tablet (5 mg total) by mouth 3 (three) times daily. Take in the morning, lunch time and 4pm. Do not take after 4pm.    Dispense:  90 tablet    Refill:  3    Patient Instructions  - Continue ASA and crestor for stroke prevention - will prescribe midodrine 5mg  three times a day, take in the morning, lunch time and 4pm. Do not take after 4pm.  - check BP at home 2-3 times a day with lying, sitting and standing to see if we need to increase or decrease the dose  - BP goal 110-130 - Follow up with your primary care physician for stroke risk factor modification. Recommend maintain blood pressure goal 110-130/80, diabetes with hemoglobin A1c goal below 6.5% and lipids with LDL cholesterol goal below 70 mg/dL.  - avoid driving on dialysis days and for all other days take BP before driving. BP goal 110-130.  - follow up in 3 months   Rosalin Hawking, MD PhD [facility] [address], [place], [place] [address] [phone]

## 2016-09-08 DIAGNOSIS — D509 Iron deficiency anemia, unspecified: Secondary | ICD-10-CM | POA: Diagnosis not present

## 2016-09-08 DIAGNOSIS — N186 End stage renal disease: Secondary | ICD-10-CM | POA: Diagnosis not present

## 2016-09-08 DIAGNOSIS — D631 Anemia in chronic kidney disease: Secondary | ICD-10-CM | POA: Diagnosis not present

## 2016-09-08 DIAGNOSIS — N2581 Secondary hyperparathyroidism of renal origin: Secondary | ICD-10-CM | POA: Diagnosis not present

## 2016-09-11 DIAGNOSIS — N2581 Secondary hyperparathyroidism of renal origin: Secondary | ICD-10-CM | POA: Diagnosis not present

## 2016-09-11 DIAGNOSIS — D631 Anemia in chronic kidney disease: Secondary | ICD-10-CM | POA: Diagnosis not present

## 2016-09-11 DIAGNOSIS — N186 End stage renal disease: Secondary | ICD-10-CM | POA: Diagnosis not present

## 2016-09-11 DIAGNOSIS — D509 Iron deficiency anemia, unspecified: Secondary | ICD-10-CM | POA: Diagnosis not present

## 2016-09-13 DIAGNOSIS — N186 End stage renal disease: Secondary | ICD-10-CM | POA: Diagnosis not present

## 2016-09-13 DIAGNOSIS — D631 Anemia in chronic kidney disease: Secondary | ICD-10-CM | POA: Diagnosis not present

## 2016-09-13 DIAGNOSIS — D509 Iron deficiency anemia, unspecified: Secondary | ICD-10-CM | POA: Diagnosis not present

## 2016-09-13 DIAGNOSIS — N2581 Secondary hyperparathyroidism of renal origin: Secondary | ICD-10-CM | POA: Diagnosis not present

## 2016-09-15 DIAGNOSIS — D631 Anemia in chronic kidney disease: Secondary | ICD-10-CM | POA: Diagnosis not present

## 2016-09-15 DIAGNOSIS — D509 Iron deficiency anemia, unspecified: Secondary | ICD-10-CM | POA: Diagnosis not present

## 2016-09-15 DIAGNOSIS — N2581 Secondary hyperparathyroidism of renal origin: Secondary | ICD-10-CM | POA: Diagnosis not present

## 2016-09-15 DIAGNOSIS — N186 End stage renal disease: Secondary | ICD-10-CM | POA: Diagnosis not present

## 2016-09-18 DIAGNOSIS — D509 Iron deficiency anemia, unspecified: Secondary | ICD-10-CM | POA: Diagnosis not present

## 2016-09-18 DIAGNOSIS — D631 Anemia in chronic kidney disease: Secondary | ICD-10-CM | POA: Diagnosis not present

## 2016-09-18 DIAGNOSIS — N186 End stage renal disease: Secondary | ICD-10-CM | POA: Diagnosis not present

## 2016-09-18 DIAGNOSIS — N2581 Secondary hyperparathyroidism of renal origin: Secondary | ICD-10-CM | POA: Diagnosis not present

## 2016-09-20 DIAGNOSIS — N186 End stage renal disease: Secondary | ICD-10-CM | POA: Diagnosis not present

## 2016-09-20 DIAGNOSIS — D631 Anemia in chronic kidney disease: Secondary | ICD-10-CM | POA: Diagnosis not present

## 2016-09-20 DIAGNOSIS — D509 Iron deficiency anemia, unspecified: Secondary | ICD-10-CM | POA: Diagnosis not present

## 2016-09-20 DIAGNOSIS — N2581 Secondary hyperparathyroidism of renal origin: Secondary | ICD-10-CM | POA: Diagnosis not present

## 2016-09-22 DIAGNOSIS — N2581 Secondary hyperparathyroidism of renal origin: Secondary | ICD-10-CM | POA: Diagnosis not present

## 2016-09-22 DIAGNOSIS — N186 End stage renal disease: Secondary | ICD-10-CM | POA: Diagnosis not present

## 2016-09-22 DIAGNOSIS — D509 Iron deficiency anemia, unspecified: Secondary | ICD-10-CM | POA: Diagnosis not present

## 2016-09-22 DIAGNOSIS — D631 Anemia in chronic kidney disease: Secondary | ICD-10-CM | POA: Diagnosis not present

## 2016-09-24 DIAGNOSIS — D631 Anemia in chronic kidney disease: Secondary | ICD-10-CM | POA: Diagnosis not present

## 2016-09-24 DIAGNOSIS — N186 End stage renal disease: Secondary | ICD-10-CM | POA: Diagnosis not present

## 2016-09-24 DIAGNOSIS — N2581 Secondary hyperparathyroidism of renal origin: Secondary | ICD-10-CM | POA: Diagnosis not present

## 2016-09-24 DIAGNOSIS — D509 Iron deficiency anemia, unspecified: Secondary | ICD-10-CM | POA: Diagnosis not present

## 2016-09-26 DIAGNOSIS — D509 Iron deficiency anemia, unspecified: Secondary | ICD-10-CM | POA: Diagnosis not present

## 2016-09-26 DIAGNOSIS — N2581 Secondary hyperparathyroidism of renal origin: Secondary | ICD-10-CM | POA: Diagnosis not present

## 2016-09-26 DIAGNOSIS — D631 Anemia in chronic kidney disease: Secondary | ICD-10-CM | POA: Diagnosis not present

## 2016-09-26 DIAGNOSIS — N186 End stage renal disease: Secondary | ICD-10-CM | POA: Diagnosis not present

## 2016-09-29 DIAGNOSIS — D631 Anemia in chronic kidney disease: Secondary | ICD-10-CM | POA: Diagnosis not present

## 2016-09-29 DIAGNOSIS — N186 End stage renal disease: Secondary | ICD-10-CM | POA: Diagnosis not present

## 2016-09-29 DIAGNOSIS — D509 Iron deficiency anemia, unspecified: Secondary | ICD-10-CM | POA: Diagnosis not present

## 2016-09-29 DIAGNOSIS — N2581 Secondary hyperparathyroidism of renal origin: Secondary | ICD-10-CM | POA: Diagnosis not present

## 2016-10-03 DIAGNOSIS — D509 Iron deficiency anemia, unspecified: Secondary | ICD-10-CM | POA: Diagnosis not present

## 2016-10-03 DIAGNOSIS — N186 End stage renal disease: Secondary | ICD-10-CM | POA: Diagnosis not present

## 2016-10-03 DIAGNOSIS — Z992 Dependence on renal dialysis: Secondary | ICD-10-CM | POA: Diagnosis not present

## 2016-10-03 DIAGNOSIS — I871 Compression of vein: Secondary | ICD-10-CM | POA: Diagnosis not present

## 2016-10-03 DIAGNOSIS — N2581 Secondary hyperparathyroidism of renal origin: Secondary | ICD-10-CM | POA: Diagnosis not present

## 2016-10-03 DIAGNOSIS — T82868A Thrombosis of vascular prosthetic devices, implants and grafts, initial encounter: Secondary | ICD-10-CM | POA: Diagnosis not present

## 2016-10-03 DIAGNOSIS — D631 Anemia in chronic kidney disease: Secondary | ICD-10-CM | POA: Diagnosis not present

## 2016-10-04 ENCOUNTER — Inpatient Hospital Stay (HOSPITAL_COMMUNITY)
Admission: EM | Admit: 2016-10-04 | Discharge: 2016-10-06 | DRG: 682 | Disposition: A | Discharge status: Home / Self Care | Payer: Medicare Other | Attending: Family Medicine | Admitting: Family Medicine

## 2016-10-04 ENCOUNTER — Encounter (HOSPITAL_COMMUNITY): Payer: Self-pay | Admitting: Emergency Medicine

## 2016-10-04 DIAGNOSIS — Z8673 Personal history of transient ischemic attack (TIA), and cerebral infarction without residual deficits: Secondary | ICD-10-CM

## 2016-10-04 DIAGNOSIS — Z96642 Presence of left artificial hip joint: Secondary | ICD-10-CM | POA: Diagnosis present

## 2016-10-04 DIAGNOSIS — F329 Major depressive disorder, single episode, unspecified: Secondary | ICD-10-CM | POA: Diagnosis present

## 2016-10-04 DIAGNOSIS — Z8249 Family history of ischemic heart disease and other diseases of the circulatory system: Secondary | ICD-10-CM

## 2016-10-04 DIAGNOSIS — R195 Other fecal abnormalities: Secondary | ICD-10-CM | POA: Diagnosis not present

## 2016-10-04 DIAGNOSIS — D649 Anemia, unspecified: Secondary | ICD-10-CM

## 2016-10-04 DIAGNOSIS — I248 Other forms of acute ischemic heart disease: Secondary | ICD-10-CM | POA: Diagnosis present

## 2016-10-04 DIAGNOSIS — I10 Essential (primary) hypertension: Secondary | ICD-10-CM | POA: Diagnosis present

## 2016-10-04 DIAGNOSIS — D631 Anemia in chronic kidney disease: Secondary | ICD-10-CM | POA: Diagnosis present

## 2016-10-04 DIAGNOSIS — N2581 Secondary hyperparathyroidism of renal origin: Secondary | ICD-10-CM | POA: Diagnosis present

## 2016-10-04 DIAGNOSIS — K5730 Diverticulosis of large intestine without perforation or abscess without bleeding: Secondary | ICD-10-CM | POA: Diagnosis present

## 2016-10-04 DIAGNOSIS — I63311 Cerebral infarction due to thrombosis of right middle cerebral artery: Secondary | ICD-10-CM | POA: Diagnosis present

## 2016-10-04 DIAGNOSIS — Z992 Dependence on renal dialysis: Secondary | ICD-10-CM

## 2016-10-04 DIAGNOSIS — D509 Iron deficiency anemia, unspecified: Secondary | ICD-10-CM | POA: Diagnosis present

## 2016-10-04 DIAGNOSIS — M109 Gout, unspecified: Secondary | ICD-10-CM | POA: Diagnosis present

## 2016-10-04 DIAGNOSIS — Z833 Family history of diabetes mellitus: Secondary | ICD-10-CM

## 2016-10-04 DIAGNOSIS — Z8719 Personal history of other diseases of the digestive system: Secondary | ICD-10-CM

## 2016-10-04 DIAGNOSIS — Z8546 Personal history of malignant neoplasm of prostate: Secondary | ICD-10-CM

## 2016-10-04 DIAGNOSIS — R55 Syncope and collapse: Secondary | ICD-10-CM

## 2016-10-04 DIAGNOSIS — Z923 Personal history of irradiation: Secondary | ICD-10-CM

## 2016-10-04 DIAGNOSIS — I120 Hypertensive chronic kidney disease with stage 5 chronic kidney disease or end stage renal disease: Principal | ICD-10-CM | POA: Diagnosis present

## 2016-10-04 DIAGNOSIS — E785 Hyperlipidemia, unspecified: Secondary | ICD-10-CM | POA: Diagnosis present

## 2016-10-04 DIAGNOSIS — Z79899 Other long term (current) drug therapy: Secondary | ICD-10-CM

## 2016-10-04 DIAGNOSIS — I953 Hypotension of hemodialysis: Secondary | ICD-10-CM | POA: Diagnosis present

## 2016-10-04 DIAGNOSIS — D62 Acute posthemorrhagic anemia: Secondary | ICD-10-CM | POA: Diagnosis present

## 2016-10-04 DIAGNOSIS — E8889 Other specified metabolic disorders: Secondary | ICD-10-CM | POA: Diagnosis not present

## 2016-10-04 DIAGNOSIS — Z9889 Other specified postprocedural states: Secondary | ICD-10-CM

## 2016-10-04 DIAGNOSIS — N189 Chronic kidney disease, unspecified: Secondary | ICD-10-CM

## 2016-10-04 DIAGNOSIS — N186 End stage renal disease: Secondary | ICD-10-CM | POA: Diagnosis not present

## 2016-10-04 DIAGNOSIS — Z87891 Personal history of nicotine dependence: Secondary | ICD-10-CM

## 2016-10-04 DIAGNOSIS — K921 Melena: Secondary | ICD-10-CM | POA: Diagnosis present

## 2016-10-04 DIAGNOSIS — D500 Iron deficiency anemia secondary to blood loss (chronic): Secondary | ICD-10-CM | POA: Diagnosis present

## 2016-10-04 DIAGNOSIS — D638 Anemia in other chronic diseases classified elsewhere: Secondary | ICD-10-CM | POA: Diagnosis present

## 2016-10-04 DIAGNOSIS — Z8042 Family history of malignant neoplasm of prostate: Secondary | ICD-10-CM

## 2016-10-04 DIAGNOSIS — Z7982 Long term (current) use of aspirin: Secondary | ICD-10-CM

## 2016-10-04 LAB — COMPREHENSIVE METABOLIC PANEL
ALBUMIN: 3.2 g/dL — AB (ref 3.5–5.0)
ALT: 11 U/L — ABNORMAL LOW (ref 17–63)
ANION GAP: 15 (ref 5–15)
AST: 24 U/L (ref 15–41)
Alkaline Phosphatase: 52 U/L (ref 38–126)
BUN: 36 mg/dL — AB (ref 6–20)
CHLORIDE: 93 mmol/L — AB (ref 101–111)
CO2: 29 mmol/L (ref 22–32)
Calcium: 8.3 mg/dL — ABNORMAL LOW (ref 8.9–10.3)
Creatinine, Ser: 7.51 mg/dL — ABNORMAL HIGH (ref 0.61–1.24)
GFR calc Af Amer: 7 mL/min — ABNORMAL LOW (ref >60)
GFR calc non Af Amer: 6 mL/min — ABNORMAL LOW (ref >60)
GLUCOSE: 111 mg/dL — AB (ref 65–99)
POTASSIUM: 3.9 mmol/L (ref 3.5–5.1)
Sodium: 137 mmol/L (ref 135–145)
Total Bilirubin: 0.2 mg/dL — ABNORMAL LOW (ref 0.3–1.2)
Total Protein: 5.6 g/dL — ABNORMAL LOW (ref 6.5–8.1)

## 2016-10-04 LAB — CBC WITH DIFFERENTIAL/PLATELET
BASOS ABS: 0 10*3/uL (ref 0.0–0.1)
BASOS PCT: 0 %
EOS ABS: 0 10*3/uL (ref 0.0–0.7)
EOS PCT: 0 %
HCT: 21.5 % — ABNORMAL LOW (ref 39.0–52.0)
Hemoglobin: 7 g/dL — ABNORMAL LOW (ref 13.0–17.0)
Lymphocytes Relative: 13 %
Lymphs Abs: 1.1 10*3/uL (ref 0.7–4.0)
MCH: 31.8 pg (ref 26.0–34.0)
MCHC: 32.6 g/dL (ref 30.0–36.0)
MCV: 97.7 fL (ref 78.0–100.0)
MONO ABS: 0.8 10*3/uL (ref 0.1–1.0)
Monocytes Relative: 9 %
NEUTROS ABS: 6.5 10*3/uL (ref 1.7–7.7)
Neutrophils Relative %: 78 %
PLATELETS: 225 10*3/uL (ref 150–400)
RBC: 2.2 MIL/uL — ABNORMAL LOW (ref 4.22–5.81)
RDW: 18 % — AB (ref 11.5–15.5)
WBC: 8.4 10*3/uL (ref 4.0–10.5)

## 2016-10-04 LAB — URINALYSIS, ROUTINE W REFLEX MICROSCOPIC
Bilirubin Urine: NEGATIVE
GLUCOSE, UA: NEGATIVE mg/dL
HGB URINE DIPSTICK: NEGATIVE
Ketones, ur: NEGATIVE mg/dL
LEUKOCYTES UA: NEGATIVE
Nitrite: NEGATIVE
Protein, ur: 100 mg/dL — AB
SPECIFIC GRAVITY, URINE: 1.009 (ref 1.005–1.030)
pH: 8.5 — ABNORMAL HIGH (ref 5.0–8.0)

## 2016-10-04 LAB — URINE MICROSCOPIC-ADD ON
BACTERIA UA: NONE SEEN
RBC / HPF: NONE SEEN RBC/hpf (ref 0–5)

## 2016-10-04 LAB — POC OCCULT BLOOD, ED: Fecal Occult Bld: POSITIVE — AB

## 2016-10-04 LAB — TROPONIN I: TROPONIN I: 0.1 ng/mL — AB (ref <0.03)

## 2016-10-04 LAB — PREPARE RBC (CROSSMATCH)

## 2016-10-04 LAB — MRSA PCR SCREENING: MRSA by PCR: NEGATIVE

## 2016-10-04 MED ORDER — CITALOPRAM HYDROBROMIDE 20 MG PO TABS
10.0000 mg | ORAL_TABLET | Freq: Every day | ORAL | Status: DC
  Administered 2016-10-04 – 2016-10-06 (×3): 10 mg via ORAL
  Filled 2016-10-04 (×3): qty 1

## 2016-10-04 MED ORDER — BISACODYL 10 MG RE SUPP: 10.0000 mg | Freq: Every day | RECTAL | Status: DC | PRN

## 2016-10-04 MED ORDER — MIDODRINE HCL 5 MG PO TABS
5.0000 mg | ORAL_TABLET | Freq: Three times a day (TID) | ORAL | Status: DC
  Administered 2016-10-05 – 2016-10-06 (×6): 5 mg via ORAL
  Filled 2016-10-04 (×5): qty 1

## 2016-10-04 MED ORDER — ACETAMINOPHEN 325 MG PO TABS: 650.0000 mg | ORAL_TABLET | Freq: Four times a day (QID) | ORAL | Status: DC | PRN

## 2016-10-04 MED ORDER — MIDODRINE HCL 5 MG PO TABS
5.0000 mg | ORAL_TABLET | Freq: Three times a day (TID) | ORAL | Status: DC
  Administered 2016-10-04: 5 mg via ORAL
  Filled 2016-10-04: qty 1

## 2016-10-04 MED ORDER — DOXERCALCIFEROL 0.5 MCG PO CAPS: 1.5000 ug | ORAL_CAPSULE | ORAL | Status: DC

## 2016-10-04 MED ORDER — CALCITRIOL 0.5 MCG PO CAPS
1.0000 ug | ORAL_CAPSULE | ORAL | Status: DC
  Administered 2016-10-05: 1 ug via ORAL

## 2016-10-04 MED ORDER — PROMETHAZINE HCL 25 MG PO TABS: 12.5000 mg | ORAL_TABLET | Freq: Four times a day (QID) | ORAL | Status: DC | PRN

## 2016-10-04 MED ORDER — SENNOSIDES-DOCUSATE SODIUM 8.6-50 MG PO TABS
1.0000 | ORAL_TABLET | Freq: Every evening | ORAL | Status: DC | PRN
  Filled 2016-10-04: qty 1

## 2016-10-04 MED ORDER — DOCUSATE SODIUM 100 MG PO CAPS
200.0000 mg | ORAL_CAPSULE | Freq: Two times a day (BID) | ORAL | Status: DC
  Administered 2016-10-04 – 2016-10-05 (×4): 200 mg via ORAL
  Filled 2016-10-04 (×5): qty 2

## 2016-10-04 MED ORDER — MAGNESIUM CITRATE PO SOLN: 1.0000 | Freq: Once | ORAL | Status: DC | PRN

## 2016-10-04 MED ORDER — ACETAMINOPHEN 650 MG RE SUPP: 650.0000 mg | Freq: Four times a day (QID) | RECTAL | Status: DC | PRN

## 2016-10-04 MED ORDER — SODIUM CHLORIDE 0.9 % IV SOLN
Freq: Once | INTRAVENOUS | Status: AC
  Administered 2016-10-04: 10:00:00 via INTRAVENOUS

## 2016-10-04 MED ORDER — ALLOPURINOL 100 MG PO TABS
100.0000 mg | ORAL_TABLET | Freq: Every day | ORAL | Status: DC
  Administered 2016-10-04 – 2016-10-06 (×3): 100 mg via ORAL
  Filled 2016-10-04 (×3): qty 1

## 2016-10-04 MED ORDER — HYDROCODONE-ACETAMINOPHEN 5-325 MG PO TABS: 1.0000 | ORAL_TABLET | ORAL | Status: DC | PRN

## 2016-10-04 MED ORDER — SODIUM CHLORIDE 0.9% FLUSH
3.0000 mL | Freq: Two times a day (BID) | INTRAVENOUS | Status: DC
  Administered 2016-10-04 – 2016-10-05 (×4): 3 mL via INTRAVENOUS

## 2016-10-04 MED ORDER — ROSUVASTATIN CALCIUM 20 MG PO TABS
20.0000 mg | ORAL_TABLET | Freq: Every day | ORAL | Status: DC
  Administered 2016-10-04 – 2016-10-05 (×2): 20 mg via ORAL
  Filled 2016-10-04 (×2): qty 1

## 2016-10-04 NOTE — ED Notes (Signed)
Pt unable to urinate at this time.  

## 2016-10-04 NOTE — Consult Note (Signed)
[facility] Consult: 9:10 AM 10/04/2016  LOS: 0 days    Referring Provider: Dr Aggie Moats  Primary Care Physician:  [name], MD Primary Gastroenterologist:  Dr. [name].      Reason for Consultation:  Anemia, FOBT +   HPI: [name] is a 82 y.o. male.  PMH ESRD, HD on MWF.  HTN, no longer on antihtn meds due to spells of dizziness, syncope a/w post dialysis hypotension.  Sepsis/PNA 12/2015.  Gout.  Sensorineural hearing loss. Prostate cancer 2002, treated with radiation. Radiation cystitis 2010.    Hx diverticular bleed.  Chronic dz, iron deficiency and blood loss anemia.  PRBCs: 06/2015 x5.  08/2009 Colonoscopy. Screening study: internal hemorrhoids.  06/15/15 Flex sig: for Hematochezia, Hgb drop to 7.7, weight loss in setting of daily 81 ASA and Aleve.  Dr [name]. Dark red blood and clots throughout, no transition to brown stool.  Left colon tics so could be more proximal vs backwash of blood from left colon. 06/16/15 NM bleeding scan: Increased radiotracer uptake in the stomach raises concern for gastritis. No small bowel or large bowel acute gastrointestinal bleeding focus is identified on this study. 06/17/15 Colonoscopy:  Sessile polyp was found in the descending colon; polypectomy was performed with cold forceps. Pathology: unremarkable colonic mucosa.  Moderate diverticulosis was noted in the left colon.  "Think this is diverticular hemorrhage, resolved." 06/17/15 EGD: Antral non-erosive gastritis: erythema, otherwise normal 07/06/2015 [facility].  Gastritis, duodenitis.  Blood in lumen starting at 5'40", 1' post first cecal image, and throughout remainder of colon.  No exact source, suspect diverticular bleed.    Home meds include 81 mg ASA, no PPI, no NSAIDs.  Protonix JX'B on discharge in  9/2016but absent from discharge med list 12/2015.      In ED today c/o weakness, generalized fatigue starting during HD yesterday.  No chest pain, no labored breathing.   Hgb 10.2 12/2015, 9.7 02/2016, 8.9 on 09/26/16, 7.0 today.  MCV 97.   Stool per ED MD is soft, black but not bloody or maroon.   Had dialysis access issues yesterday AM and was sent to [facility] [phone]) for declotting and new stent.  Staff there says blood loss for these procedures is generally minimal but I do not have the report for this specific procedure.   By the time of finishing HD at 3 PM his fatigue was significantly increased over baseline.  He did not sleep well.  No presyncope, SOB, chest pain.  Bandages placed after vascular procedure remain in place and have no evidence of hemrrahage, dialysis access site with some blood soaking to bandage.  Pt says stools always black, soft:same as seen on DRE today.  .  No nausea, no abdominal pain.  No maroon, bloody stool.  Appetite overall improved.  No sweats.  Stays cold all the time "August baby".  Records from [facility] show he takes Ferric Citrate (Auryxia) 2 po tid with meals, 1 with snacks.  Gets Venofer infusions frequently, with every visit 11/1 - 11/21.  Monthly Mircera given 11/21.    He recalls no blood transfusions since 06/2015.    Past Medical History:  Diagnosis Date  . ANEMIA-IRON DEFICIENCY 03/09/2008  . Arthritis    cervical spine.   . Complex renal cyst 06/19/2011  . Depression 03/26/2014  . DIVERTICULOSIS, COLON 03/09/2008  . ESRD (end stage renal disease) ([place]) 03/09/2008  . GOUT 03/09/2008  . Hemorrhoids 08/2009   internal.   . History of blood transfusion   . HYPERLIPIDEMIA 03/09/2008  . Hyperparathyroidism ([place])   . HYPERTENSION 03/09/2008  . Prostate cancer [facility]) 2002   Completed external beam radiation 2003.per HPI  . PROSTATE CANCER, HX OF 03/09/2008  . Radiation cystitis 2010.  Marland Kitchen Stroke [facility])     Past Surgical History:    Procedure Laterality Date  . AV FISTULA PLACEMENT Left 01/24/2013   Procedure: INSERTION OF ARTERIOVENOUS (AV) GORE-TEX GRAFT ARM;  Surgeon: Rosetta Posner, MD;  Location: [place];  Service: Vascular;  Laterality: Left;  . COLONOSCOPY N/A 06/17/2015   Procedure: COLONOSCOPY;  Surgeon: Gatha Mayer, MD;  Location: [place];  Service: Endoscopy;  Laterality: N/A;  . ESOPHAGOGASTRODUODENOSCOPY N/A 06/17/2015   Procedure: ESOPHAGOGASTRODUODENOSCOPY (EGD);  Surgeon: Gatha Mayer, MD;  Location: [facility] END[facility]COPY;  Service: Endoscopy;  Laterality: N/A;  . FLEXIBLE SIGMOIDOSCOPY N/A 06/15/2015   Procedure: FLEXIBLE SIGMOIDOSCOPY;  Surgeon: Gatha Mayer, MD;  Location: [place];  Service: Endoscopy;  Laterality: N/A;  . GIVENS CAPSULE STUDY N/A 07/06/2015   Procedure: GIVENS CAPSULE STUDY;  Surgeon: Milus Banister, MD;  Location: [place];  Service: Endoscopy;  Laterality: N/A;  . JOINT REPLACEMENT     HIP  . rotator cuff repair right  4/08  . s/p left hip replacement  2007   Dr. Percell Miller ortho  . TONSILLECTOMY      Prior to Admission medications   Medication Sig Start Date End Date Taking? Authorizing Provider  allopurinol (ZYLOPRIM) 100 MG tablet TAKE 1 TABLET DAILY 08/07/16  Yes [name], MD  aspirin 81 MG tablet Take 1 tablet (81 mg total) by mouth daily. 07/11/15  Yes Shanker Kristeen Mans, MD  citalopram (CELEXA) 10 MG tablet Take 1 tablet (10 mg total) by mouth daily. 08/07/16  Yes [name], MD  docusate sodium (COLACE) 100 MG capsule Take 2 capsules (200 mg total) by mouth 2 (two) times daily. 06/22/15  Yes Thurnell Lose, MD  doxercalciferol (HECTOROL) 0.5 MCG capsule Take 1.5 mcg by mouth 3 (three) times a week. MWF   Yes Historical Provider, MD  midodrine (PROAMATINE) 5 MG tablet Take 1 tablet (5 mg total) by mouth 3 (three) times daily. Take in the morning, lunch time and 4pm. Do not take after 4pm. 09/07/16  Yes Rosalin Hawking, MD  Multiple Vitamin (MULTIVITAMIN WITH MINERALS) TABS  tablet Take 1 tablet by mouth daily.   Yes Historical Provider, MD  nystatin (MYCOSTATIN/NYSTOP) powder Use as directed twice per day 07/06/16  Yes [name], MD  rosuvastatin (CRESTOR) 20 MG tablet Take 1 tablet (20 mg total) by mouth at bedtime. 01/17/16  Yes [name], MD    Scheduled Meds:  Infusions: . sodium chloride     PRN Meds:    Allergies as of 10/04/2016  . (No Known Allergies)    Family History  Problem Relation Age of Onset  . Hypertension Father   . Diabetes Father   . Cancer Father     prostate cancer  . Hypertension Brother   .  Cancer Brother     Social History   Social History  . Marital status: Married    Spouse name: N/A  . Number of children: 2  . Years of education: N/A   Occupational History  . retired Print production planner    Social History Main Topics  . Smoking status: Former Smoker    Years: 5.00    Types: Cigarettes    Quit date: 09/17/1982  . Smokeless tobacco: Never Used  . Alcohol use No     Comment: rare  . Drug use: No  . Sexual activity: Not on file   Other Topics Concern  . Not on file   Social History Narrative  . No narrative on file    REVIEW OF SYSTEMS: Constitutional:  Per HPI ENT:  No nose bleeds Pulm:  Per HPI.  No cough CV:  No palpitations, no LE edema.  GU:  No hematuria.  Scant urine output on non dialysis days.   GI:  Per HPI Heme:  No unusual bleeding or bruising   Transfusions:  Per HPI Neuro:  No headaches, no peripheral tingling or numbness Derm:  No itching, no rash or sores.  Endocrine:  No sweats or chills.  No polyuria or dysuria Immunization:  Not queried Travel:  None beyond local counties in last few months.    PHYSICAL EXAM: Vital signs in last 24 hours: Vitals:   10/04/16 0815 10/04/16 0830  BP: 131/62 129/63  Pulse: 92 90  Resp: 14 18  Temp:     Wt Readings from Last 3 Encounters:  09/07/16 68.6 kg (151 lb 3.2 oz)  07/18/16 66.7 kg (147 lb)  07/06/16 67.6 kg (149 lb)      General: pleasant, thin aged AAM wrapped up in flannel sheets.  Comfortable.  Receiving PRBC #1. Head:  No asymmetry or swelling  Eyes:  No icterus or pallor Ears:  Not HOH.    Nose:  No discharge or congestion Mouth:  Clear, moist MM. Neck:  No JV Lungs:  Clear bil.  No SOB or cough.   Heart: RRR.  No mrg.  S1, s2 present. Abdomen:  Soft, NT, ND.  Active BS.   Rectal: greenish black stools, not melena.  Color is greenish black, c/w po iron per my DRE.  + enlarged prostate.  FOBT + on specimen tested by ED staff.    Musc/Skeltl: no joint swelling, gross deformity. Extremities:  No CCE.  3 + pedal pulses  Neurologic:  Oriented x 3.  No tremor.  Appropriate.  Moves all 4 limbs Skin:  No rash or sores.  Tattoos:  None seen   Psych:  Pleasant.  Cooperative.  Calm.   Intake/Output from previous day: No intake/output data recorded. Intake/Output this shift: No intake/output data recorded.  LAB RESULTS:  Recent Labs  10/04/16 0639  WBC 8.4  HGB 7.0*  HCT 21.5*  PLT 225   BMET Lab Results  Component Value Date   NA 137 10/04/2016   NA 141 02/29/2016   NA 137 12/08/2015   K 3.9 10/04/2016   K 4.1 02/29/2016   K 3.9 12/08/2015   CL 93 (L) 10/04/2016   CL 103 02/29/2016   CL 99 (L) 12/08/2015   CO2 29 10/04/2016   CO2 26 02/29/2016   CO2 26 12/08/2015   GLUCOSE 111 (H) 10/04/2016   GLUCOSE 86 02/29/2016   GLUCOSE 91 12/08/2015   BUN 36 (H) 10/04/2016   BUN 23 (H) 02/29/2016   BUN  46 (H) 12/08/2015   CREATININE 7.51 (H) 10/04/2016   CREATININE 6.48 (H) 02/29/2016   CREATININE 8.69 (H) 12/08/2015   CALCIUM 8.3 (L) 10/04/2016   CALCIUM 7.2 (L) 02/29/2016   CALCIUM 8.4 (L) 12/08/2015   LFT  Recent Labs  10/04/16 0639  PROT 5.6*  ALBUMIN 3.2*  AST 24  ALT 11*  ALKPHOS 52  BILITOT 0.2*   PT/INR Lab Results  Component Value Date   INR 1.36 12/06/2015   INR 1.23 07/05/2015   Hepatitis Panel No results for input(s): HEPBSAG, HCVAB, HEPAIGM, HEPBIGM in  the last 72 hours. C-Diff No components found for: CDIFF Lipase  No results found for: LIPASE  Drugs of Abuse  No results found for: LABOPIA, COCAINSCRNUR, LABBENZ, AMPHETMU, THCU, LABBARB   RADIOLOGY STUDIES: No results found.   IMPRESSION:   *  Acute on chronic anemia.  Symptomatic. On regular Mircera and freqent infusion of Venofer (7 in 09/2016 alone).  1 of 1 PRBCs ordered, currently transfusing.  *  FOBT +, dark but not melenic, no hematochezia.  Chronically dark stools, due to oral iron.   Hx gastritis on EGD 06/2015 and gastritis, duodenitis per contemporaneous CE.  Not on PPI, though RXd in 06/2015.   Hx diverticular bleed 06/2015.    *  ESRD.  MWF HD  *  HD access issues requiring declotting procedure and stent placement yesterday 10/04/16.  ? If some of the Hgb drop is due to this?    PLAN:     *  No plans for EGD or colonoscopy unless evidence of Acute GI hemorrhage.  Agree with renal/carb mod diet.  *  Will start H2 blocker for hx non-erosive gastritis.  Dose once daily given renal dz (d/w PharmD)    *  CBC in AM.    Azucena Freed  10/04/2016, 9:10 AM Pager: [phone]  GI ATTENDING  History, laboratories, x-rays, multiple prior endoscopy reports reviewed. Patient seen and examined. Agree with comprehensive consultation note as outlined above. Asked to see for anemia and Hemoccult-positive stool. Patient has worsening chronic anemia (normocytic) of renal disease. There is no evidence for acute or subacute GI bleeding. His baseline hemoglobins in the 9 range. Did have procedure yesterday which could have associated blood loss. Had acute diverticular bleed last year with extensive GI workup including colonoscopy, upper endoscopy, and capsule endoscopy. No findings outside of diverticulosis. Recommend continuing once daily empiric PPI for upper GI mucosal protection. Transfuse as clinically indicated for symptomatic anemia. Check to see the patient is on an agent such as  Epogen for his anemia of chronic renal disease. Consider hematology input if further help needed with anemia. Will leave to primary team. No further recommendations or plan workup at this time. Will sign off but are available for questions or new problems. Thank you  Docia Chuck. Geri Seminole., M.D. [facility] Division of Gastroenterology

## 2016-10-04 NOTE — ED Triage Notes (Signed)
Patient reports dizziness , fatigue/generalized weakness onset yesterday while on hemodialysis treatment , pt. added insomnia and feeling anxious last night , denies fever / respirations unlabored .

## 2016-10-04 NOTE — ED Notes (Signed)
GI PA Provider at bedside.

## 2016-10-04 NOTE — H&P (Signed)
History and Physical    [name] VHQ:[id number] DOB: 03-Sep-1929 DOA: 10/04/2016   PCP: [name], MD   Patient coming from:  Home  Chief Complaint:  Generalized weakness.   HPI: [name] is a 82 y.o. male with medical history significant for ESRD on HD MWF but last dialysis on 11/28 ,and a history of chronic anemia on Mircera and Venofer (7 in November alone)  presenting with increasing fatigue, lightheadedness. Symptoms have been present since prior day. His last visit to the dialysis center was on 1129, at which time, he had his AVG declotted, and dialysis took place instead of his scheduled day 11/20. No syncopal episodes The patient reported insomnia, and some anxiety. He denies any fever chills or night sweats. He denies any chest pain or palpitations. He denies any increasing shortness of breath. He is fatigued. He denies nausea vomiting or diarrhea. He denies any visible blood in the stools, but he has chronic darker stools as he takes Iron. No recent infection. He is appetite is normal. He denies any PICA. He does report a history of diverticulosis since 2009, with last his last colonoscopy was in 06/2015, at which time no other abnormalities were noted. The patient makes very little urine, and he denies any dysuria or gross hematuria.  ED Course:  BP 129/58   Pulse 93   Temp 99 F (37.2 C) (Oral)   Resp 14   SpO2 100%    sodium 137 potassium 3.9  BUN 36 creatinine 7.51 EGFR 7 Glucose111  bilirubin 0.2  troponin 0.08 white count 8.4 hemoglobin 7 hematocrit 21.5  platelets 225 glucose 111 Hemoccult positive EKG shows Sinus rhythm Repol abnrm suggests ischemia, lateral leads  Prolonged QT interval at 506. He was last seen by his cardiologist on 07/18/2016. 2D echo from February 2017 shows EF 65 to 41%,[id number]  systolic function and grade 1 diastolic  Review of Systems: As per HPI otherwise 10 point review of systems negative.   Past Medical History:  Diagnosis Date   . ANEMIA-IRON DEFICIENCY 03/09/2008  . Arthritis    cervical spine.   . Complex renal cyst 06/19/2011  . Depression 03/26/2014  . DIVERTICULOSIS, COLON 03/09/2008  . ESRD (end stage renal disease) ([place]) 03/09/2008  . GOUT 03/09/2008  . Hemorrhoids 08/2009   internal.   . History of blood transfusion   . HYPERLIPIDEMIA 03/09/2008  . Hyperparathyroidism ([place])   . HYPERTENSION 03/09/2008  . Prostate cancer [facility]) 2002   Completed external beam radiation 2003.per HPI  . PROSTATE CANCER, HX OF 03/09/2008  . Radiation cystitis 2010.  Marland Kitchen Stroke [facility])     Past Surgical History:  Procedure Laterality Date  . AV FISTULA PLACEMENT Left 01/24/2013   Procedure: INSERTION OF ARTERIOVENOUS (AV) GORE-TEX GRAFT ARM;  Surgeon: Rosetta Posner, MD;  Location: [place];  Service: Vascular;  Laterality: Left;  . COLONOSCOPY N/A 06/17/2015   Procedure: COLONOSCOPY;  Surgeon: Gatha Mayer, MD;  Location: [place];  Service: Endoscopy;  Laterality: N/A;  . ESOPHAGOGASTRODUODENOSCOPY N/A 06/17/2015   Procedure: ESOPHAGOGASTRODUODENOSCOPY (EGD);  Surgeon: Gatha Mayer, MD;  Location[facility] END[facility]COPY;  Service: Endoscopy;  Laterality: N/A;  . FLEXIBLE SIGMOIDOSCOPY N/A 06/15/2015   Procedure: FLEXIBLE SIGMOIDOSCOPY;  Surgeon: Gatha Mayer, MD;  Location: [place];  Service: Endoscopy;  Laterality: N/A;  . GIVENS CAPSULE STUDY N/A 07/06/2015   Procedure: GIVENS CAPSULE STUDY;  Surgeon: Milus Banister, MD;  Location: [place];  Service: Endoscopy;  Laterality: N/A;  .  JOINT REPLACEMENT     HIP  . rotator cuff repair right  4/08  . s/p left hip replacement  2007   Dr. Percell Miller ortho  . TONSILLECTOMY      Social History Social History   Social History  . Marital status: Married    Spouse name: N/A  . Number of children: 2  . Years of education: N/A   Occupational History  . retired Print production planner    Social History Main Topics  . Smoking status: Former Smoker    Years: 5.00    Types: Cigarettes     Quit date: 09/17/1982  . Smokeless tobacco: Never Used  . Alcohol use No     Comment: rare  . Drug use: No  . Sexual activity: Not on file   Other Topics Concern  . Not on file   Social History Narrative  . No narrative on file     No Known Allergies  Family History  Problem Relation Age of Onset  . Hypertension Father   . Diabetes Father   . Cancer Father     prostate cancer  . Hypertension Brother   . Cancer Brother       Prior to Admission medications   Medication Sig Start Date End Date Taking? Authorizing Provider  allopurinol (ZYLOPRIM) 100 MG tablet TAKE 1 TABLET DAILY 08/07/16   [name], MD  aspirin 81 MG tablet Take 1 tablet (81 mg total) by mouth daily. 07/11/15   Shanker Kristeen Mans, MD  citalopram (CELEXA) 10 MG tablet Take 1 tablet (10 mg total) by mouth daily. 08/07/16   [name], MD  docusate sodium (COLACE) 100 MG capsule Take 2 capsules (200 mg total) by mouth 2 (two) times daily. 06/22/15   Thurnell Lose, MD  doxercalciferol (HECTOROL) 0.5 MCG capsule Take 1.5 mcg by mouth 3 (three) times a week. MWF    Historical Provider, MD  midodrine (PROAMATINE) 5 MG tablet Take 1 tablet (5 mg total) by mouth 3 (three) times daily. Take in the morning, lunch time and 4pm. Do not take after 4pm. 09/07/16   Rosalin Hawking, MD  Multiple Vitamin (MULTIVITAMIN WITH MINERALS) TABS tablet Take 1 tablet by mouth daily.    Historical Provider, MD  nystatin (MYCOSTATIN/NYSTOP) powder Use as directed twice per day 07/06/16   [name], MD  rosuvastatin (CRESTOR) 20 MG tablet Take 1 tablet (20 mg total) by mouth at bedtime. 01/17/16   [name], MD    Physical Exam:    Vitals:   10/04/16 0800 10/04/16 0815 10/04/16 0830 10/04/16 0930  BP: 122/65 131/62 129/63 129/58  Pulse: 92 92 90 93  Resp: 15 14 18 14   Temp:    99 F (37.2 C)  TempSrc:    Oral  SpO2: 98%  100% 100%       Constitutional: NAD, calm, comfortable Vitals:   10/04/16 0800 10/04/16 0815 10/04/16  0830 10/04/16 0930  BP: 122/65 131/62 129/63 129/58  Pulse: 92 92 90 93  Resp: 15 14 18 14   Temp:    99 F (37.2 C)  TempSrc:    Oral  SpO2: 98%  100% 100%   Eyes: PERRL, lids and conjunctivae normal. Sclera muddy  ENMT: Mucous membranes are moist. Posterior pharynx clear of any exudate or lesions  Neck: normal, supple, no masses, no thyromegaly Respiratory: clear to auscultation bilaterally, no wheezing, no crackles. Normal respiratory effort. No accessory muscle use.  Cardiovascular: Regular rate  and rhythm with occasional ectopic beats , no murmurs / rubs / gallops. No extremity edema. 2+ pedal pulses. No carotid bruits. L AVF non tender  Abdomen: no tenderness, no masses palpated. No hepatosplenomegaly. Bowel sounds positive.  Musculoskeletal: no clubbing / cyanosis. No joint deformity upper and lower extremities. Good ROM, no contractures. Normal muscle tone.  Skin: no rashes, lesions, ulcers.  Neurologic: CN 2-12 grossly intact. Sensation intact, DTR normal. Strength 5/5 in all 4. Gait not tested  Psychiatric: Normal judgment and insight. Alert and oriented x 3. Normal mood.     Labs on Admission: I have personally reviewed following labs and imaging studies  CBC:  Recent Labs Lab 10/04/16 0639  WBC 8.4  NEUTROABS 6.5  HGB 7.0*  HCT 21.5*  MCV 97.7  PLT 694    Basic Metabolic Panel:  Recent Labs Lab 10/04/16 0639  NA 137  K 3.9  CL 93*  CO2 29  GLUCOSE 111*  BUN 36*  CREATININE 7.51*  CALCIUM 8.3*    GFR: CrCl cannot be calculated (Unknown ideal weight.).  Liver Function Tests:  Recent Labs Lab 10/04/16 0639  AST 24  ALT 11*  ALKPHOS 52  BILITOT 0.2*  PROT 5.6*  ALBUMIN 3.2*   No results for input(s): LIPASE, AMYLASE in the last 168 hours. No results for input(s): AMMONIA in the last 168 hours.  Coagulation Profile: No results for input(s): INR, PROTIME in the last 168 hours.  Cardiac Enzymes:  Recent Labs Lab 10/04/16 0805    TROPONINI 0.08*    BNP (last 3 results) No results for input(s): PROBNP in the last 8760 hours.  HbA1C: No results for input(s): HGBA1C in the last 72 hours.  CBG: No results for input(s): GLUCAP in the last 168 hours.  Lipid Profile: No results for input(s): CHOL, HDL, LDLCALC, TRIG, CHOLHDL, LDLDIRECT in the last 72 hours.  Thyroid Function Tests: No results for input(s): TSH, T4TOTAL, FREET4, T3FREE, THYROIDAB in the last 72 hours.  Anemia Panel: No results for input(s): VITAMINB12, FOLATE, FERRITIN, TIBC, IRON, RETICCTPCT in the last 72 hours.  Urine analysis:    Component Value Date/Time   COLORURINE YELLOW 12/07/2015 2145   APPEARANCEUR CLOUDY (A) 12/07/2015 2145   LABSPEC 1.017 12/07/2015 2145   PHURINE 7.5 12/07/2015 2145   GLUCOSEU NEGATIVE 12/07/2015 2145   HGBUR TRACE (A) 12/07/2015 2145   BILIRUBINUR NEGATIVE 12/07/2015 2145   [place] NEGATIVE 12/07/2015 2145   PROTEINUR 100 (A) 12/07/2015 2145   UROBILINOGEN 0.2 07/06/2015 0003   NITRITE NEGATIVE 12/07/2015 2145   LEUKOCYTESUR NEGATIVE 12/07/2015 2145    Sepsis Labs: @LABRCNTIP (procalcitonin:4,lacticidven:4) )No results found for this or any previous visit (from the past 240 hour(s)).   Radiological Exams on Admission: No results found.  EKG: Independently reviewed.  Assessment/Plan Active Problems:   Hematochezia   Anemia, blood loss   Symptomatic anemia   Gout   Iron deficiency anemia   Essential hypertension   Diverticulosis of colon   PROSTATE CANCER, HX OF   Acute blood loss anemia   History of GI bleed   ESRD on dialysis [facility])   Cerebrovascular accident (CVA) due to thrombosis of right middle cerebral artery ([place])   Acute on chronic iron deficiency Anemia, symptomatic. He is on  Mircera and Venofer (7 in November alone)  Hemoglobin on admission 7 (was 8.4 on 11/29).  Baseline Hb 9 Hcult + he is to receive 2 units of blood. Unknown if patient received heparin during AVF thrombectomy  on 11/29,  which may have affected his Hb. Last colonoscopy in 06/2015 with diverticulosis findings  Repeat CBC in am Continue Iron supplements -Transfuse 1 unit packed red blood cells if Hb less than 8 or acutely bleeding GI consult has been called by EDP  ESRD on HD MWF last on 11/28. Cr 7.59  Baseline in the 6s  Nephrology involved, Dr Jonnie Finner . notified. Renal Diet. Other plans as per Nephrology Check CMET in am  Prolongued QT 506 with abnormal EKG. PAtient has a history of abnormal EKG in the past, now with mild lateral  lead ischemic changes, new since prior. Primary Cardiologist Dr. Gwenlyn Found.  Will serial EKG ans Tn, if changes are still present or worsening will consult Cardiology     Hypertension BP 129/58   Pulse 93  Controlled Continue home anti-hypertensive medications   Hyperlipidemia Continue home statins   Depression Continue home  Celexa  Gout Continue Allopurinol     DVT prophylaxis:   SCD's  Code Status:   Full   Family Communication:  Discussed with patient Disposition Plan: Expect patient to be discharged to home after condition improves Consults called:    GI and Renal  Admission status:Tele  Obs     Dotty Gonzalo E, PA-C [facility]   10/04/2016, 9:50 AM

## 2016-10-04 NOTE — ED Provider Notes (Signed)
[facility] DEPT Provider Note   CSN: [id number] Arrival date & time: 10/04/16  0608     History   Chief Complaint Chief Complaint  Patient presents with  . Dizziness  . Fatigue    HPI [name] is a 82 y.o. male.  HPI  3AM yesterday had to go to vascular lab Dialysis yesterday went to pick up at 3 and was very lightheaded, blood pressure dropped Waited 30 min then went home Ate then got up, was very anxious which is unusual, BP 110s Walking around house with walker, looking like he was going to pass out Slept very little last night Was getting more weak all over Has hx of weakness left leg  Repeating questions, repeating conversations which is increased from baseline Taking iron, chronically black stool, no change, no other bloody stools No fever, cough, congestion, abd pain, chest pain, not really SOB  M-W-F [address] Past Medical History:  Diagnosis Date  . ANEMIA-IRON DEFICIENCY 03/09/2008  . Arthritis    cervical spine.   . Complex renal cyst 06/19/2011  . Depression 03/26/2014  . DIVERTICULOSIS, COLON 03/09/2008  . ESRD (end stage renal disease) ([place]) 03/09/2008  . GOUT 03/09/2008  . Hemorrhoids 08/2009   internal.   . History of blood transfusion   . HYPERLIPIDEMIA 03/09/2008  . Hyperparathyroidism ([place])   . HYPERTENSION 03/09/2008  . Prostate cancer [facility]) 2002   Completed external beam radiation 2003.per HPI  . PROSTATE CANCER, HX OF 03/09/2008  . Radiation cystitis 2010.  Marland Kitchen Stroke [facility])     Patient Active Problem List   Diagnosis Date Noted  . Orthostatic hypotension 09/07/2016  . Rash 07/09/2016  . Fever in adult 12/06/2015  . Sepsis, unspecified organism ([place]) 12/06/2015  . Sepsis ([place]) 12/06/2015  . ESRD on dialysis ([place]) 09/09/2015  . Cerebrovascular accident (CVA) due to thrombosis of right middle cerebral artery ([place]) 09/09/2015  . Bleeding gastrointestinal   . History of GI bleed   . HLD (hyperlipidemia)   . Anemia, blood loss   .  Benign neoplasm of descending colon   . Gastritis   . Syncope 06/15/2015  . Acute GI bleeding 06/15/2015  . Acute blood loss anemia 06/15/2015  . Diverticulosis of colon with hemorrhage   . Pulse irregularity 05/06/2015  . Lower back pain 03/26/2014  . Depression 03/26/2014  . Hematochezia 03/14/2013  . Dizziness 07/27/2012  . Complex renal cyst 06/19/2011  . Left arm weakness 06/19/2011  . Preventative health care 06/17/2011  . SHOULDER PAIN, LEFT 06/03/2010  . Gout 03/09/2008  . Iron deficiency anemia 03/09/2008  . Essential hypertension 03/09/2008  . DIVERTICULOSIS, COLON 03/09/2008  . FATIGUE 03/09/2008  . PROSTATE CANCER, HX OF 03/09/2008    Past Surgical History:  Procedure Laterality Date  . AV FISTULA PLACEMENT Left 01/24/2013   Procedure: INSERTION OF ARTERIOVENOUS (AV) GORE-TEX GRAFT ARM;  Surgeon: Rosetta Posner, MD;  Location: [place];  Service: Vascular;  Laterality: Left;  . COLONOSCOPY N/A 06/17/2015   Procedure: COLONOSCOPY;  Surgeon: Gatha Mayer, MD;  Location: [place];  Service: Endoscopy;  Laterality: N/A;  . ESOPHAGOGASTRODUODENOSCOPY N/A 06/17/2015   Procedure: ESOPHAGOGASTRODUODENOSCOPY (EGD);  Surgeon: Gatha Mayer, MD;  Location: [facility] ENDOSCOPY;  Service: Endoscopy;  Laterality: N/A;  . FLEXIBLE SIGMOIDOSCOPY N/A 06/15/2015   Procedure: FLEXIBLE SIGMOIDOSCOPY;  Surgeon: Gatha Mayer, MD;  Location: [facility];  Service: Endoscopy;  Laterality: N/A;  . GIVENS CAPSULE STUDY N/A 07/06/2015   Procedure: GIVENS CAPSULE STUDY;  Surgeon: Milus Banister, MD;  Location: [place];  Service: Endoscopy;  Laterality: N/A;  . JOINT REPLACEMENT     HIP  . rotator cuff repair right  4/08  . s/p left hip replacement  2007   Dr. Percell Miller ortho  . TONSILLECTOMY         Home Medications    Prior to Admission medications   Medication Sig Start Date End Date Taking? Authorizing Provider  allopurinol (ZYLOPRIM) 100 MG tablet TAKE 1 TABLET DAILY 08/07/16   [name], MD  aspirin 81 MG tablet Take 1 tablet (81 mg total) by mouth daily. 07/11/15   Shanker Kristeen Mans, MD  citalopram (CELEXA) 10 MG tablet Take 1 tablet (10 mg total) by mouth daily. 08/07/16   [name], MD  docusate sodium (COLACE) 100 MG capsule Take 2 capsules (200 mg total) by mouth 2 (two) times daily. 06/22/15   Thurnell Lose, MD  doxercalciferol (HECTOROL) 0.5 MCG capsule Take 1.5 mcg by mouth 3 (three) times a week. MWF    Historical Provider, MD  midodrine (PROAMATINE) 5 MG tablet Take 1 tablet (5 mg total) by mouth 3 (three) times daily. Take in the morning, lunch time and 4pm. Do not take after 4pm. 09/07/16   Rosalin Hawking, MD  Multiple Vitamin (MULTIVITAMIN WITH MINERALS) TABS tablet Take 1 tablet by mouth daily.    Historical Provider, MD  nystatin (MYCOSTATIN/NYSTOP) powder Use as directed twice per day 07/06/16   [name], MD  rosuvastatin (CRESTOR) 20 MG tablet Take 1 tablet (20 mg total) by mouth at bedtime. 01/17/16   [name], MD    Family History Family History  Problem Relation Age of Onset  . Hypertension Father   . Diabetes Father   . Cancer Father     prostate cancer  . Hypertension Brother   . Cancer Brother     Social History Social History  Substance Use Topics  . Smoking status: Former Smoker    Years: 5.00    Types: Cigarettes    Quit date: 09/17/1982  . Smokeless tobacco: Never Used  . Alcohol use No     Comment: rare     Allergies   Patient has no known allergies.   Review of Systems Review of Systems  Constitutional: Positive for fatigue. Negative for fever.  HENT: Negative for congestion and sore throat.   Eyes: Negative for visual disturbance.  Respiratory: Negative for cough and shortness of breath.   Cardiovascular: Negative for chest pain.  Gastrointestinal: Negative for abdominal pain, constipation, diarrhea, nausea and vomiting.  Genitourinary: Negative for difficulty urinating (goes just small amt).  Musculoskeletal:  Negative for back pain and neck stiffness.  Skin: Negative for rash.  Neurological: Positive for weakness (generalized, chronic LLE) and light-headedness. Negative for syncope, speech difficulty, numbness and headaches.     Physical Exam Updated Vital Signs BP (!) 108/47 (BP Location: Right Arm)   Pulse 100   Temp 98.2 F (36.8 C) (Oral)   Resp 16   SpO2 100%   Physical Exam  Constitutional: He is oriented to person, place, and time. He appears well-developed and well-nourished. No distress.  HENT:  Head: Normocephalic and atraumatic.  Eyes: Conjunctivae and EOM are normal.  Neck: Normal range of motion.  Cardiovascular: Normal rate, regular rhythm, normal heart sounds and intact distal pulses.  Exam reveals no gallop and no friction rub.   No murmur heard. Pulmonary/Chest: Effort normal and breath sounds normal.  No respiratory distress. He has no wheezes. He has no rales.  Abdominal: Soft. He exhibits no distension. There is no tenderness. There is no guarding.  Genitourinary: Rectal exam shows guaiac positive stool (black stool).  Musculoskeletal: He exhibits no edema.  Fistula left arm, palpable thrill  Neurological: He is alert and oriented to person, place, and time.  Skin: Skin is warm and dry. He is not diaphoretic.  Nursing note and vitals reviewed.    ED Treatments / Results  Labs (all labs ordered are listed, but only abnormal results are displayed) Labs Reviewed  CBC WITH DIFFERENTIAL/PLATELET - Abnormal; Notable for the following:       Result Value   RBC 2.20 (*)    Hemoglobin 7.0 (*)    HCT 21.5 (*)    RDW 18.0 (*)    All other components within normal limits  COMPREHENSIVE METABOLIC PANEL - Abnormal; Notable for the following:    Chloride 93 (*)    Glucose, Bld 111 (*)    BUN 36 (*)    Creatinine, Ser 7.51 (*)    Calcium 8.3 (*)    Total Protein 5.6 (*)    Albumin 3.2 (*)    ALT 11 (*)    Total Bilirubin 0.2 (*)    GFR calc non Af Amer 6 (*)     GFR calc Af Amer 7 (*)    All other components within normal limits  POC OCCULT BLOOD, ED - Abnormal; Notable for the following:    Fecal Occult Bld POSITIVE (*)    All other components within normal limits  URINALYSIS, ROUTINE W REFLEX MICROSCOPIC (NOT AT [facility])  TYPE AND SCREEN  PREPARE RBC (CROSSMATCH)    EKG  EKG Interpretation None       Radiology No results found.  Procedures Procedures (including critical care time)  Medications Ordered in ED Medications  0.9 %  sodium chloride infusion (not administered)     Initial Impression / Assessment and Plan / ED Course  I have reviewed the triage vital signs and the nursing notes.  Pertinent labs & imaging results that were available during my care of the patient were reviewed by me and considered in my medical decision making (see chart for details).  Clinical Course    82 y.o. male with history of ESRD on hemodialysis M-W-F, colonic diverticulosis with LGIB requiring blood transfusion, syncopal episodes evaluated by cardiology, CVA, hypertension and hyperlipidemia who presents with concern for lightheadedness, fatigue, near-syncope. Patient is afebrile, denies any infectious type symptoms, doubt sepsis as etiology of his fatigue and near syncope. No chest pain or shortness of breath to suggest cardiac etiology. Labs show hemoglobin is 7, with last check in our system 9.7, and baseline of 10 per wife.  Given concern for symptomatic anemia, discussed blood transfusion with patient and wife, and they agree to 1 unit of packed red blood cells. Rectal exam shows black stool, however patient reports this is chronic and secondary to his iron intake. He denies any symptoms of GI bleed, however given his history of diverticular bleed in the past her requiring transfusion, and pt on dialysis, will admit for continued observation of anemia and respiratory status. Per hospitalist team, rec consult to GI who was contacted.  Vascular team  latera called stating that patient had enough blood loss yesterday to explain drop in hgb and left this message with hospitalist.   Final Clinical Impressions(s) / ED Diagnoses   Final diagnoses:  Symptomatic anemia  Near  syncope    New Prescriptions New Prescriptions   No medications on file     Gareth Morgan, MD 10/04/16 1707

## 2016-10-04 NOTE — ED Notes (Signed)
Admitting Provider at bedside. 

## 2016-10-04 NOTE — ED Notes (Signed)
Dr. Freda Munro, admitting, at bedside.

## 2016-10-04 NOTE — Consult Note (Signed)
[facility] Renal Consultation Note    Indication for Consultation:  Management of ESRD/hemodialysis; anemia, hypertension/volume and secondary hyperparathyroidism  HPI: [name] is a 82 y.o. male with ESRD (MWF [place]) on HD since November 2015 with prior HTN currently off all meds, hx prostate cancer, former smoker, occasional drinker, hx diverticular bleed and gastritis.  He missed Monday HD due to his [place] being clotted and had a recent declot on Tuesday and afterwards went to dialysis fir make up.  He is compliant with treatments.  He left 2.4 kg above EDW yesterday. He ran his full time with a net UF of 1.2. Not clear why he did not run at his full BFR. He left 1.2 above EDW which I suspect is due to low BP during treatment.  In the ED hgb was found to 7.1 with heme + stools. His stools are always black he says, because of Auryxia.  Notes following declot by Dr. Augustin Coupe stated, "PTA 80% outflow stenosis treated with Viabahn stent and 80% PTA subclavian stenosis with aspiration of a huge thrombus from centrals."  When he got up today his wife (a retired Personal assistant) noticed his unsteady gait and that he was weaker than usually. He noted lightheadedness. He had no N, V, D, fever chills, SOB, CP.  Past Medical History:  Diagnosis Date  . ANEMIA-IRON DEFICIENCY 03/09/2008  . Arthritis    cervical spine.   . Complex renal cyst 06/19/2011  . Depression 03/26/2014  . DIVERTICULOSIS, COLON 03/09/2008  . ESRD (end stage renal disease) ([place]) 03/09/2008  . GOUT 03/09/2008  . Hemorrhoids 08/2009   internal.   . History of blood transfusion   . HYPERLIPIDEMIA 03/09/2008  . Hyperparathyroidism ([place])   . HYPERTENSION 03/09/2008  . Prostate cancer [facility]) 2002   Completed external beam radiation 2003.per HPI  . PROSTATE CANCER, HX OF 03/09/2008  . Radiation cystitis 2010.  Marland Kitchen Stroke [facility])    Past Surgical History:  Procedure Laterality Date  . AV FISTULA PLACEMENT Left 01/24/2013   Procedure:  INSERTION OF ARTERIOVENOUS (AV) GORE-TEX GRAFT ARM;  Surgeon: Rosetta Posner, MD;  Location: [place];  Service: Vascular;  Laterality: Left;  . COLONOSCOPY N/A 06/17/2015   Procedure: COLONOSCOPY;  Surgeon: Gatha Mayer, MD;  Location: [place];  Service: Endoscopy;  Laterality: N/A;  . ESOPHAGOGASTRODUODENOSCOPY N/A 06/17/2015   Procedure: ESOPHAGOGASTRODUODENOSCOPY (EGD);  Surgeon: Gatha Mayer, MD;  [facility] ENDOSCOPY;  Service: Endoscopy;  Laterality: N/A;  . FLEXIBLE SIGMOIDOSCOPY N/A 06/15/2015   Procedure: FLEXIBLE SIGMOIDOSCOPY;  Surgeon: Gatha Mayer, MD;  Location: [place];  Service: Endoscopy;  Laterality: N/A;  . GIVENS CAPSULE STUDY N/A 07/06/2015   Procedure: GIVENS CAPSULE STUDY;  Surgeon: Milus Banister, MD;  Location: [place];  Service: Endoscopy;  Laterality: N/A;  . JOINT REPLACEMENT     HIP  . rotator cuff repair right  4/08  . s/p left hip replacement  2007   Dr. Percell Miller ortho  . TONSILLECTOMY     Family History  Problem Relation Age of Onset  . Hypertension Father   . Diabetes Father   . Cancer Father     prostate cancer  . Hypertension Brother   . Cancer Brother    Social History:  reports that he quit smoking about 34 years ago. His smoking use included Cigarettes. He quit after 5.00 years of use. He has never used smokeless tobacco. He reports that he does not drink alcohol or use  drugs. No Known Allergies Prior to Admission medications   Medication Sig Start Date End Date Taking? Authorizing Provider  allopurinol (ZYLOPRIM) 100 MG tablet TAKE 1 TABLET DAILY 08/07/16  Yes [name], MD  aspirin 81 MG tablet Take 1 tablet (81 mg total) by mouth daily. 07/11/15  Yes Shanker Kristeen Mans, MD  citalopram (CELEXA) 10 MG tablet Take 1 tablet (10 mg total) by mouth daily. 08/07/16  Yes [name], MD  docusate sodium (COLACE) 100 MG capsule Take 2 capsules (200 mg total) by mouth 2 (two) times daily. 06/22/15  Yes Thurnell Lose, MD  doxercalciferol  (HECTOROL) 0.5 MCG capsule Take 1.5 mcg by mouth 3 (three) times a week. MWF   Yes Historical Provider, MD  midodrine (PROAMATINE) 5 MG tablet Take 1 tablet (5 mg total) by mouth 3 (three) times daily. Take in the morning, lunch time and 4pm. Do not take after 4pm. 09/07/16  Yes Rosalin Hawking, MD  Multiple Vitamin (MULTIVITAMIN WITH MINERALS) TABS tablet Take 1 tablet by mouth daily.   Yes Historical Provider, MD  nystatin (MYCOSTATIN/NYSTOP) powder Use as directed twice per day 07/06/16  Yes [name], MD  rosuvastatin (CRESTOR) 20 MG tablet Take 1 tablet (20 mg total) by mouth at bedtime. 01/17/16  Yes [name], MD   Current Facility-Administered Medications  Medication Dose Route Frequency Provider Last Rate Last Dose  . acetaminophen (TYLENOL) tablet 650 mg  650 mg Oral Q6H PRN Rondel Jumbo, PA-C       Or  . acetaminophen (TYLENOL) suppository 650 mg  650 mg Rectal Q6H PRN Rondel Jumbo, PA-C      . allopurinol (ZYLOPRIM) tablet 100 mg  100 mg Oral Daily Rondel Jumbo, PA-C   100 mg at 10/04/16 1110  . bisacodyl (DULCOLAX) suppository 10 mg  10 mg Rectal Daily PRN Rondel Jumbo, PA-C      . calcitRIOL (ROCALTROL) capsule 1 mcg  1 mcg Oral Q M,W,F-HD Alric Seton, PA-C      . citalopram (CELEXA) tablet 10 mg  10 mg Oral Daily Rondel Jumbo, PA-C   10 mg at 10/04/16 1109  . docusate sodium (COLACE) capsule 200 mg  200 mg Oral BID Rondel Jumbo, PA-C   200 mg at 10/04/16 1109  . HYDROcodone-acetaminophen (NORCO/VICODIN) 5-325 MG per tablet 1-2 tablet  1-2 tablet Oral Q4H PRN Rondel Jumbo, PA-C      . magnesium citrate solution 1 Bottle  1 Bottle Oral Once PRN Rondel Jumbo, PA-C      . midodrine (PROAMATINE) tablet 5 mg  5 mg Oral TID Rondel Jumbo, PA-C   5 mg at 10/04/16 1108  . promethazine (PHENERGAN) tablet 12.5 mg  12.5 mg Oral Q6H PRN Rondel Jumbo, PA-C      . rosuvastatin (CRESTOR) tablet 20 mg  20 mg Oral QHS Rondel Jumbo, PA-C      . senna-docusate (Senokot-S) tablet  1 tablet  1 tablet Oral QHS PRN Rondel Jumbo, PA-C      . sodium chloride flush (NS) 0.9 % injection 3 mL  3 mL Intravenous Q12H Rondel Jumbo, PA-C   3 mL at 10/04/16 1225   Current Outpatient Prescriptions  Medication Sig Dispense Refill  . allopurinol (ZYLOPRIM) 100 MG tablet TAKE 1 TABLET DAILY 90 tablet 3  . aspirin 81 MG tablet Take 1 tablet (81 mg total) by mouth daily.    . citalopram (CELEXA)  10 MG tablet Take 1 tablet (10 mg total) by mouth daily. 90 tablet 3  . docusate sodium (COLACE) 100 MG capsule Take 2 capsules (200 mg total) by mouth 2 (two) times daily. 60 capsule 0  . doxercalciferol (HECTOROL) 0.5 MCG capsule Take 1.5 mcg by mouth 3 (three) times a week. MWF    . midodrine (PROAMATINE) 5 MG tablet Take 1 tablet (5 mg total) by mouth 3 (three) times daily. Take in the morning, lunch time and 4pm. Do not take after 4pm. 90 tablet 3  . Multiple Vitamin (MULTIVITAMIN WITH MINERALS) TABS tablet Take 1 tablet by mouth daily.    Marland Kitchen nystatin (MYCOSTATIN/NYSTOP) powder Use as directed twice per day 45 g 1  . rosuvastatin (CRESTOR) 20 MG tablet Take 1 tablet (20 mg total) by mouth at bedtime. 90 tablet 3   Labs: Basic Metabolic Panel:  Recent Labs Lab 10/04/16 0639  NA 137  K 3.9  CL 93*  CO2 29  GLUCOSE 111*  BUN 36*  CREATININE 7.51*  CALCIUM 8.3*   Liver Function Tests:  Recent Labs Lab 10/04/16 0639  AST 24  ALT 11*  ALKPHOS 52  BILITOT 0.2*  PROT 5.6*  ALBUMIN 3.2*   No results for input(s): LIPASE, AMYLASE in the last 168 hours. No results for input(s): AMMONIA in the last 168 hours. CBC:  Recent Labs Lab 10/04/16 0639  WBC 8.4  NEUTROABS 6.5  HGB 7.0*  HCT 21.5*  MCV 97.7  PLT 225   Cardiac Enzymes:  Recent Labs Lab 10/04/16 0805  TROPONINI 0.08*   CBG: No results for input(s): GLUCAP in the last 168 hours. Iron Studies: No results for input(s): IRON, TIBC, TRANSFERRIN, FERRITIN in the last 72 hours. Studies/Results: No results  found.  ROS: As per HPI otherwise negative.  Physical Exam: Vitals:   10/04/16 1030 10/04/16 1045 10/04/16 1100 10/04/16 1215  BP: 137/63 137/66 130/61 140/68  Pulse: 94 96 94 87  Resp: 17 22 17 18   Temp:    99 F (37.2 C)  TempSrc:    Oral  SpO2: 99% 98% 99% 98%     General:  Healthy appearing elderly AAM Head: NCAT sclera not icteric MMM Neck: Supple.  Lungs: CTA bilaterally without wheezes, rales, or rhonchi. Breathing is unlabored. Heart: RRR with occasional ectomy Abdomen: soft NT + BS Lower extremities:without edema or ischemic changes, no open wounds  Neuro: Alert and articulate. Moves all extremities spontaneously. Psych:  Responds to questions appropriately with a normal affect. Dialysis Access: left lower AVGG with retention suture/dressing - mild swelling + edema nontender  Dialysis Orders: [place] 3.5 hours 400/800 EDW 67.5 2 K 2 Ca left lower AVGG, no heparin, venofer 100 x 5 Mircera 75 q 2 - last 11/21, on 50 11/8, calcitriol 1.5 Recent labs: hgb 8.9 11/21, 10.1 11/15, 10.8 11/1, tsat 19% in Oct given 500 venofer and 50 per week tsat 21% November ferritin 1115 October, iPTH 400 Ca/P good  Assessment/Plan: 1.  Symptomatic anemia with heme + stools - GI has seen. Plan conservative tx unless acute GI bleed 2.  ESRD -  MWF - HD today - K 3.9 3.  Hypertension/volume  - volume control with dialysis 4.  Anemia  - hgb 7.1 down from 8.9 11/21 and 10.1 11/15 - transfusion 1 units in the ED and can give second unit on HD  - then continue Fe and ESA at the outpatient unit 5.  Metabolic bone disease -  Binders/Calcitriol 6.  Nutrition - renal/carb mod diet  Myriam Jacobson, PA-C [place] [phone] 10/04/2016, 1:29 PM   Pt seen, examined and agree w A/P as above.  Kelly Splinter MD [employer] pager [phone]   10/04/2016, 4:01 PM

## 2016-10-05 DIAGNOSIS — N186 End stage renal disease: Secondary | ICD-10-CM

## 2016-10-05 DIAGNOSIS — D500 Iron deficiency anemia secondary to blood loss (chronic): Secondary | ICD-10-CM | POA: Diagnosis not present

## 2016-10-05 DIAGNOSIS — E8889 Other specified metabolic disorders: Secondary | ICD-10-CM | POA: Diagnosis not present

## 2016-10-05 DIAGNOSIS — Z8546 Personal history of malignant neoplasm of prostate: Secondary | ICD-10-CM | POA: Diagnosis not present

## 2016-10-05 DIAGNOSIS — I953 Hypotension of hemodialysis: Secondary | ICD-10-CM | POA: Diagnosis not present

## 2016-10-05 DIAGNOSIS — Z96642 Presence of left artificial hip joint: Secondary | ICD-10-CM | POA: Diagnosis present

## 2016-10-05 DIAGNOSIS — Z992 Dependence on renal dialysis: Secondary | ICD-10-CM | POA: Diagnosis not present

## 2016-10-05 DIAGNOSIS — F329 Major depressive disorder, single episode, unspecified: Secondary | ICD-10-CM | POA: Diagnosis present

## 2016-10-05 DIAGNOSIS — Z923 Personal history of irradiation: Secondary | ICD-10-CM | POA: Diagnosis not present

## 2016-10-05 DIAGNOSIS — E785 Hyperlipidemia, unspecified: Secondary | ICD-10-CM | POA: Diagnosis present

## 2016-10-05 DIAGNOSIS — I158 Other secondary hypertension: Secondary | ICD-10-CM | POA: Diagnosis not present

## 2016-10-05 DIAGNOSIS — Z8249 Family history of ischemic heart disease and other diseases of the circulatory system: Secondary | ICD-10-CM | POA: Diagnosis not present

## 2016-10-05 DIAGNOSIS — I10 Essential (primary) hypertension: Secondary | ICD-10-CM

## 2016-10-05 DIAGNOSIS — D62 Acute posthemorrhagic anemia: Secondary | ICD-10-CM | POA: Diagnosis not present

## 2016-10-05 DIAGNOSIS — Z9889 Other specified postprocedural states: Secondary | ICD-10-CM | POA: Diagnosis not present

## 2016-10-05 DIAGNOSIS — Z833 Family history of diabetes mellitus: Secondary | ICD-10-CM | POA: Diagnosis not present

## 2016-10-05 DIAGNOSIS — Z87891 Personal history of nicotine dependence: Secondary | ICD-10-CM | POA: Diagnosis not present

## 2016-10-05 DIAGNOSIS — Z79899 Other long term (current) drug therapy: Secondary | ICD-10-CM | POA: Diagnosis not present

## 2016-10-05 DIAGNOSIS — N2581 Secondary hyperparathyroidism of renal origin: Secondary | ICD-10-CM | POA: Diagnosis not present

## 2016-10-05 DIAGNOSIS — I248 Other forms of acute ischemic heart disease: Secondary | ICD-10-CM | POA: Diagnosis not present

## 2016-10-05 DIAGNOSIS — D509 Iron deficiency anemia, unspecified: Secondary | ICD-10-CM | POA: Diagnosis present

## 2016-10-05 DIAGNOSIS — Z8673 Personal history of transient ischemic attack (TIA), and cerebral infarction without residual deficits: Secondary | ICD-10-CM | POA: Diagnosis not present

## 2016-10-05 DIAGNOSIS — R9431 Abnormal electrocardiogram [ECG] [EKG]: Secondary | ICD-10-CM | POA: Diagnosis not present

## 2016-10-05 DIAGNOSIS — M109 Gout, unspecified: Secondary | ICD-10-CM | POA: Diagnosis not present

## 2016-10-05 DIAGNOSIS — D631 Anemia in chronic kidney disease: Secondary | ICD-10-CM | POA: Diagnosis not present

## 2016-10-05 DIAGNOSIS — I120 Hypertensive chronic kidney disease with stage 5 chronic kidney disease or end stage renal disease: Secondary | ICD-10-CM | POA: Diagnosis not present

## 2016-10-05 DIAGNOSIS — Z8042 Family history of malignant neoplasm of prostate: Secondary | ICD-10-CM | POA: Diagnosis not present

## 2016-10-05 DIAGNOSIS — R55 Syncope and collapse: Secondary | ICD-10-CM | POA: Diagnosis not present

## 2016-10-05 DIAGNOSIS — Z7982 Long term (current) use of aspirin: Secondary | ICD-10-CM | POA: Diagnosis not present

## 2016-10-05 DIAGNOSIS — D649 Anemia, unspecified: Secondary | ICD-10-CM | POA: Diagnosis not present

## 2016-10-05 LAB — PROTIME-INR
INR: 1.24
Prothrombin Time: 15.7 seconds — ABNORMAL HIGH (ref 11.4–15.2)

## 2016-10-05 LAB — COMPREHENSIVE METABOLIC PANEL
ALBUMIN: 2.9 g/dL — AB (ref 3.5–5.0)
ALT: 9 U/L — ABNORMAL LOW (ref 17–63)
ANION GAP: 11 (ref 5–15)
AST: 14 U/L — ABNORMAL LOW (ref 15–41)
Alkaline Phosphatase: 44 U/L (ref 38–126)
BILIRUBIN TOTAL: 0.2 mg/dL — AB (ref 0.3–1.2)
BUN: 49 mg/dL — ABNORMAL HIGH (ref 6–20)
CHLORIDE: 98 mmol/L — AB (ref 101–111)
CO2: 28 mmol/L (ref 22–32)
Calcium: 8.4 mg/dL — ABNORMAL LOW (ref 8.9–10.3)
Creatinine, Ser: 9.26 mg/dL — ABNORMAL HIGH (ref 0.61–1.24)
GFR calc Af Amer: 5 mL/min — ABNORMAL LOW (ref >60)
GFR calc non Af Amer: 5 mL/min — ABNORMAL LOW (ref >60)
GLUCOSE: 91 mg/dL (ref 65–99)
POTASSIUM: 4.3 mmol/L (ref 3.5–5.1)
SODIUM: 137 mmol/L (ref 135–145)
TOTAL PROTEIN: 5.1 g/dL — AB (ref 6.5–8.1)

## 2016-10-05 LAB — CBC
HEMATOCRIT: 22.2 % — AB (ref 39.0–52.0)
HEMOGLOBIN: 7.2 g/dL — AB (ref 13.0–17.0)
MCH: 28.9 pg (ref 26.0–34.0)
MCHC: 32.4 g/dL (ref 30.0–36.0)
MCV: 89.2 fL (ref 78.0–100.0)
Platelets: 182 10*3/uL (ref 150–400)
RBC: 2.49 MIL/uL — ABNORMAL LOW (ref 4.22–5.81)
RDW: 23.7 % — ABNORMAL HIGH (ref 11.5–15.5)
WBC: 7.9 10*3/uL (ref 4.0–10.5)

## 2016-10-05 LAB — PREPARE RBC (CROSSMATCH)

## 2016-10-05 LAB — URINE CULTURE

## 2016-10-05 LAB — TROPONIN I: Troponin I: 0.11 ng/mL (ref <0.03)

## 2016-10-05 MED ORDER — HEPARIN SODIUM (PORCINE) 1000 UNIT/ML DIALYSIS: 1000.0000 [IU] | INTRAMUSCULAR | Status: DC | PRN

## 2016-10-05 MED ORDER — SODIUM CHLORIDE 0.9 % IV SOLN: 100.0000 mL | INTRAVENOUS | Status: DC | PRN

## 2016-10-05 MED ORDER — SODIUM CHLORIDE 0.9 % IV SOLN
Freq: Once | INTRAVENOUS | Status: AC
  Administered 2016-10-05: 14:00:00 via INTRAVENOUS

## 2016-10-05 MED ORDER — PENTAFLUOROPROP-TETRAFLUOROETH EX AERO: 1.0000 "application " | INHALATION_SPRAY | CUTANEOUS | Status: DC | PRN

## 2016-10-05 MED ORDER — ACETAMINOPHEN 325 MG PO TABS
650.0000 mg | ORAL_TABLET | Freq: Once | ORAL | Status: AC
Start: 2016-10-05 — End: 2016-10-05
  Administered 2016-10-05: 650 mg via ORAL
  Filled 2016-10-05: qty 2

## 2016-10-05 MED ORDER — LIDOCAINE-PRILOCAINE 2.5-2.5 % EX CREA: 1.0000 "application " | TOPICAL_CREAM | CUTANEOUS | Status: DC | PRN

## 2016-10-05 MED ORDER — ALTEPLASE 2 MG IJ SOLR: 2.0000 mg | Freq: Once | INTRAMUSCULAR | Status: DC | PRN

## 2016-10-05 MED ORDER — LIDOCAINE HCL (PF) 1 % IJ SOLN: 5.0000 mL | INTRAMUSCULAR | Status: DC | PRN

## 2016-10-05 MED ORDER — CALCITRIOL 0.5 MCG PO CAPS
ORAL_CAPSULE | ORAL | Status: AC
  Administered 2016-10-05: 1 ug via ORAL
  Filled 2016-10-05: qty 2

## 2016-10-05 MED ORDER — DIPHENHYDRAMINE HCL 25 MG PO CAPS
25.0000 mg | ORAL_CAPSULE | Freq: Once | ORAL | Status: AC
  Administered 2016-10-05: 25 mg via ORAL
  Filled 2016-10-05: qty 1

## 2016-10-05 NOTE — Evaluation (Signed)
Occupational Therapy Evaluation Patient Details Name: [name] MRN: [id number] DOB: 1929/10/21 Today's Date: 10/05/2016    History of Present Illness [name] is a 82 y.o. male.  PMH ESRD, HD on MWF.  HTN, no longer on antihtn meds due to spells of dizziness, syncope a/w post dialysis hypotension.  Sepsis/PNA 12/2015.  Gout.  Sensorineural hearing loss. Prostate cancer 2002, treated with radiation. Radiation cystitis 2010. Admitted 11/29 with c/o weakness, generalized fatigue starting during HD yesterday.  No chest pain, no labored breathing.    Clinical Impression   Pt admitted with above and presents to OT with weakness and fatigue impacting his ability to complete ADLs at prior level.  Pt currently requires min guard - min assist with self-care tasks and functional mobility.  Pt will benefit from OT acutely to increase safety and independence with ADLs prior to d/c home with available assist from family.    Follow Up Recommendations  No OT follow up    Equipment Recommendations  None recommended by OT    Recommendations for Other Services       Precautions / Restrictions Precautions Precautions: Fall      Mobility Bed Mobility Overal bed mobility: Modified Independent                Transfers Overall transfer level: Needs assistance   Transfers: Sit to/from Stand;Stand Pivot Transfers Sit to Stand: Supervision Stand pivot transfers: Min guard            Balance Overall balance assessment: Needs assistance Sitting-balance support: No upper extremity supported Sitting balance-Leahy Scale: Good       Standing balance-Leahy Scale: Fair                              ADL Overall ADL's : Needs assistance/impaired     Grooming: Wash/dry hands;Wash/dry face;Supervision/safety;Min guard Grooming Details (indicate cue type and reason): min guard for standing balance due to fatigue and instability             Lower Body Dressing:  Min guard;Sit to/from stand Lower Body Dressing Details (indicate cue type and reason): doffed/donned socks without assist, required min assist for dynamic standing balance Toilet Transfer: Minimal assistance;Regular Toilet;Ambulation;Grab bars Toilet Transfer Details (indicate cue type and reason): Noted instability with ambulation requiring min assist, min guard for sit <> stand from toilet         Functional mobility during ADLs: Min guard;Minimal assistance       Vision Vision Assessment?: No apparent visual deficits          Pertinent Vitals/Pain Pain Assessment: No/denies pain Faces Pain Scale: No hurt     Hand Dominance Right   Extremity/Trunk Assessment Upper Extremity Assessment Upper Extremity Assessment: Overall WFL for tasks assessed   Lower Extremity Assessment Lower Extremity Assessment: Overall WFL for tasks assessed (mild proximal weakness)       Communication Communication Communication: No difficulties   Cognition Arousal/Alertness: Awake/alert Behavior During Therapy: WFL for tasks assessed/performed Overall Cognitive Status: Within Functional Limits for tasks assessed                                Home Living Family/patient expects to be discharged to:: Private residence Living Arrangements: Spouse/significant other Available Help at Discharge: Family;Available 24 hours/day Type of Home: House       Home Layout: One level  Bathroom Shower/Tub: Occupational psychologist: Standard     Home Equipment: Cane - single point;Shower seat - built in;Grab bars - tub/shower          Prior Functioning/Environment Level of Independence: Independent        Comments: pt reports that he does his own ADL's, drive in daytime to run errands.        OT Problem List: Decreased strength;Decreased activity tolerance;Impaired balance (sitting and/or standing)   OT Treatment/Interventions: Self-care/ADL training;Energy  conservation;Therapeutic activities;Patient/family education    OT Goals(Current goals can be found in the care plan section) Acute Rehab OT Goals Patient Stated Goal: to go home  OT Goal Formulation: With patient Time For Goal Achievement: 10/19/16 Potential to Achieve Goals: Good  OT Frequency: Min 2X/week    End of Session Equipment Utilized During Treatment: Gait belt Nurse Communication: Mobility status  Activity Tolerance: Patient tolerated treatment well;Patient limited by fatigue Patient left: in bed;with call bell/phone within reach;with nursing/sitter in room   Time: 1212-1224 OT Time Calculation (min): 12 min Charges:  OT General Charges $OT Visit: 1 Procedure OT Evaluation $OT Eval Moderate Complexity: 1 Procedure G-Codes: OT G-codes **NOT FOR INPATIENT CLASS** Functional Assessment Tool Used: clinical judgement Functional Limitation: Self care Self Care Current Status (W8889): At least 1 percent but less than 20 percent impaired, limited or restricted Self Care Goal Status (V6945): 0 percent impaired, limited or restricted  [name], Media 10/05/2016, 1:19 PM

## 2016-10-05 NOTE — Evaluation (Signed)
Physical Therapy Evaluation Patient Details Name: [name] MRN: [id number] DOB: June 09, 1929 Today's Date: 10/05/2016   History of Present Illness  Shoua Ressler Wise is a 82 y.o. male.  PMH ESRD, HD on MWF.  HTN, no longer on antihtn meds due to spells of dizziness, syncope a/w post dialysis hypotension.  Sepsis/PNA 12/2015.  Gout.  Sensorineural hearing loss. Prostate cancer 2002, treated with radiation. Radiation cystitis 2010. Admitted 11/29 with c/o weakness, generalized fatigue starting during HD yesterday.  No chest pain, no labored breathing.   Clinical Impression  Pt admitted with/for weakness and fatigue due to symptomatic anemia.  Pt currently limited functionally due to the problems listed below.  (see problems list.)  Pt will benefit from PT to maximize function and safety to be able to get home safely with available assist of family.     Follow Up Recommendations No PT follow up    Equipment Recommendations       Recommendations for Other Services       Precautions / Restrictions Precautions Precautions: Fall      Mobility  Bed Mobility Overal bed mobility: Modified Independent                Transfers Overall transfer level: Needs assistance   Transfers: Sit to/from Stand Sit to Stand: Supervision            Ambulation/Gait Ambulation/Gait assistance: Min guard Ambulation Distance (Feet): 200 Feet Assistive device: None Gait Pattern/deviations: Step-through pattern Gait velocity: slower Gait velocity interpretation: Below normal speed for age/gender General Gait Details: tentative, short steps.  stiff with little arm swing.  Occasional scissoring to maintain balance.  Stairs            Wheelchair Mobility    Modified Rankin (Stroke Patients Only)       Balance Overall balance assessment: Needs assistance Sitting-balance support: No upper extremity supported Sitting balance-Leahy Scale: Good       Standing balance-Leahy Scale:  Fair                               Pertinent Vitals/Pain Pain Assessment: Faces Faces Pain Scale: No hurt    Home Living Family/patient expects to be discharged to:: Private residence Living Arrangements: Spouse/significant other Available Help at Discharge: Family;Available 24 hours/day Type of Home: House       Home Layout: One level Home Equipment: Cane - single point;Shower seat - built in;Grab bars - tub/shower      Prior Function Level of Independence: Independent         Comments: pt reports that he does his own ADL's, drive in daytime to run errands.     Hand Dominance   Dominant Hand: Right    Extremity/Trunk Assessment   Upper Extremity Assessment: Defer to OT evaluation           Lower Extremity Assessment: Overall WFL for tasks assessed (mild proximal weakness)         Communication   Communication: No difficulties  Cognition Arousal/Alertness: Awake/alert Behavior During Therapy: WFL for tasks assessed/performed Overall Cognitive Status: Within Functional Limits for tasks assessed                      General Comments      Exercises     Assessment/Plan    PT Assessment Patient needs continued PT services  PT Problem List Decreased strength;Decreased activity tolerance;Decreased balance;Decreased mobility  PT Treatment Interventions Gait training;Stair training;Functional mobility training;Therapeutic activities;Patient/family education    PT Goals (Current goals can be found in the Care Plan section)  Acute Rehab PT Goals Patient Stated Goal: home soon PT Goal Formulation: With patient Time For Goal Achievement: 10/12/16 Potential to Achieve Goals: Good    Frequency Min 3X/week   Barriers to discharge        Co-evaluation               End of Session   Activity Tolerance: Patient tolerated treatment well Patient left: in chair;with call bell/phone within reach Nurse Communication:  Mobility status    Functional Assessment Tool Used: clinical judgement Functional Limitation: Mobility: Walking and moving around Mobility: Walking and Moving Around Current Status (K0938): At least 1 percent but less than 20 percent impaired, limited or restricted Mobility: Walking and Moving Around Goal Status [phone]): 0 percent impaired, limited or restricted    Time: 1239-1259 PT Time Calculation (min) (ACUTE ONLY): 20 min   Charges:   PT Evaluation $PT Eval Moderate Complexity: 1 Procedure     PT G Codes:   PT G-Codes **NOT FOR INPATIENT CLASS** Functional Assessment Tool Used: clinical judgement Functional Limitation: Mobility: Walking and moving around Mobility: Walking and Moving Around Current Status (B7169): At least 1 percent but less than 20 percent impaired, limited or restricted Mobility: Walking and Moving Around Goal Status [phone]): 0 percent impaired, limited or restricted    [name] 10/05/2016, 1:11 PM  10/05/2016  Donnella Sham, PT [phone]  (pager)

## 2016-10-05 NOTE — Progress Notes (Signed)
PROGRESS NOTE [facility]   [name]   HEN:[id number] DOB: 01-Jul-1929  DOA: 10/04/2016 PCP: [name], MD   Brief Narrative:  [name] is a 82 y.o. male with medical history significant for ESRD on HD MWF but last dialysis on 11/28 ,and a history of chronic anemia on Mircera and Venofer (7 in November alone)  presenting with increasing fatigue, lightheadedness. Symptoms have been present since prior day. His last visit to the dialysis center was on 1129, at which time, he had his AVG declotted, and dialysis took place instead of his scheduled day 11/20. No syncopal episodes The patient reported insomnia, and some anxiety. He denies any fever chills or night sweats. He denies any chest pain or palpitations. He denies any increasing shortness of breath. He is fatigued. He denies nausea vomiting or diarrhea. He denies any visible blood in the stools, but he has chronic darker stools as he takes Iron. No recent infection. He is appetite is normal. He denies any PICA. He does report a history of diverticulosis since 2009, with last his last colonoscopy was in 06/2015, at which time no other abnormalities were noted. The patient makes very little urine, and he denies any dysuria or gross hematuria  Subjective: Patient seen and examined with wife at bedside. Feeling much better, but still some what weak. Was supposed to received 2 unit of PRBC yesterday but only got 1. Hb today 7.2. Nephrology ok on transfusing another unit.    Assessment & Plan: Symptomatic anemia, acute on chronic Iron deficiency in setting of ESRD, s/p 1 unit PRBC, positive FOB  Continues with symptoms  Agree on transfusing another unit GI evaluate and signed off, patient with extensive work up as outpatient with only diverticulosis. If acute gross bleeding they will get involve. Check CBC in AM - may need another unit during dialysis  ESRD on HD MWF  For HD on Friday  Renal recommendations appreciated    Abnormal EKG and mild elevated troponin - asymptomatic, no chest pain  Likely secondary to demand ischemia, Lateral lead with new ST depressions, TNI flat Check ECK in Am, Check TNI   Hypertension - stable  Continue home meds  DVT prophylaxis: Heparin   Code Status: Full Family Communication: Wife at bedside Disposition Plan: Anticipate discharge home in 24 hrs   Consultants:   Nephrology   Procedures:   Hemodialysis   Antimicrobials:  None    Objective: Vitals:   10/05/16 0501 10/05/16 0931 10/05/16 1435 10/05/16 1507  BP: (!) 121/47 (!) 102/44 (!) 120/57 (!) 125/51  Pulse: 90 91 87 87  Resp: 16 16 16 16   Temp: 98 F (36.7 C) 99.4 F (37.4 C) 98.7 F (37.1 C) 98.5 F (36.9 C)  TempSrc: Oral Oral Oral Oral  SpO2: 97% 98% 97% 100%  Weight:      Height:        Intake/Output Summary (Last 24 hours) at 10/05/16 1654 Last data filed at 10/05/16 1647  Gross per 24 hour  Intake             1350 ml  Output             1100 ml  Net              250 ml   Filed Weights   10/04/16 1700 10/04/16 2118 10/05/16 0100  Weight: 66.7 kg (147 lb) 67.9 kg (149 lb 11.2 oz) 68 kg (149 lb 14.6 oz)    Examination:  General exam: Appears calm and comfortable  HEENT: AC/AT, PERRLA, OP moist and clear Respiratory system: Clear to auscultation. No wheezes,crackle or rhonchi Cardiovascular system: S1 & S2 heard, RRR. + holosystolic murmurs,  Gastrointestinal system: Abdomen is nondistended, soft and nontender.  Normal bowel sounds heard. Central nervous system: Alert and oriented. No focal neurological deficits. Extremities: No pedal edema.  Skin: Left arm fistula with good thrill   Data Reviewed: I have personally reviewed following labs and imaging studies  CBC:  Recent Labs Lab 10/04/16 0639 10/05/16 0005  WBC 8.4 7.9  NEUTROABS 6.5  --   HGB 7.0* 7.2*  HCT 21.5* 22.2*  MCV 97.7 89.2  PLT 225 505   Basic Metabolic Panel:  Recent Labs Lab 10/04/16 0639  10/05/16 0005  NA 137 137  K 3.9 4.3  CL 93* 98*  CO2 29 28  GLUCOSE 111* 91  BUN 36* 49*  CREATININE 7.51* 9.26*  CALCIUM 8.3* 8.4*   GFR: Estimated Creatinine Clearance: 5.6 mL/min (by C-G formula based on SCr of 9.26 mg/dL (H)). Liver Function Tests:  Recent Labs Lab 10/04/16 0639 10/05/16 0005  AST 24 14*  ALT 11* 9*  ALKPHOS 52 44  BILITOT 0.2* 0.2*  PROT 5.6* 5.1*  ALBUMIN 3.2* 2.9*   No results for input(s): LIPASE, AMYLASE in the last 168 hours. No results for input(s): AMMONIA in the last 168 hours. Coagulation Profile:  Recent Labs Lab 10/05/16 0005  INR 1.24   Cardiac Enzymes:  Recent Labs Lab 10/04/16 0805 10/04/16 1817 10/05/16 0005  TROPONINI 0.08* 0.10* 0.11*   BNP (last 3 results) No results for input(s): PROBNP in the last 8760 hours. HbA1C: No results for input(s): HGBA1C in the last 72 hours. CBG: No results for input(s): GLUCAP in the last 168 hours. Lipid Profile: No results for input(s): CHOL, HDL, LDLCALC, TRIG, CHOLHDL, LDLDIRECT in the last 72 hours. Thyroid Function Tests: No results for input(s): TSH, T4TOTAL, FREET4, T3FREE, THYROIDAB in the last 72 hours. Anemia Panel: No results for input(s): VITAMINB12, FOLATE, FERRITIN, TIBC, IRON, RETICCTPCT in the last 72 hours. Sepsis Labs: No results for input(s): PROCALCITON, LATICACIDVEN in the last 168 hours.  Recent Results (from the past 240 hour(s))  Urine culture     Status: Abnormal   Collection Time: 10/04/16  1:44 PM  Result Value Ref Range Status   Specimen Description URINE, RANDOM  Final   Special Requests NONE  Final   Culture <10,000 COLONIES/mL INSIGNIFICANT GROWTH (A)  Final   Report Status 10/05/2016 FINAL  Final  MRSA PCR Screening     Status: None   Collection Time: 10/04/16  6:27 PM  Result Value Ref Range Status   MRSA by PCR NEGATIVE NEGATIVE Final    Comment:        The GeneXpert MRSA Assay (FDA approved for NASAL specimens only), is one component of  a comprehensive MRSA colonization surveillance program. It is not intended to diagnose MRSA infection nor to guide or monitor treatment for MRSA infections.      Radiology Studies: No results found.    Scheduled Meds: . allopurinol  100 mg Oral Daily  . calcitRIOL  1 mcg Oral Q M,W,F-HD  . citalopram  10 mg Oral Daily  . docusate sodium  200 mg Oral BID  . midodrine  5 mg Oral TID  . rosuvastatin  20 mg Oral QHS  . sodium chloride flush  3 mL Intravenous Q12H   Continuous Infusions:   LOS: 0 days  Chipper Oman, MD [facility] Pager [phone]  If 7PM-7AM, please contact night-coverage [email or web address] Password [facility] 10/05/2016, 4:54 PM

## 2016-10-05 NOTE — Progress Notes (Signed)
[facility] Progress Note   Dialysis Orders: MWF [facility] 3.5 hours 400/800 EDW 67.5 2 K 2 Ca left lower AVGG, no heparin, venofer 100 x 5 Mircera 75 q 2 - last 11/21, on 50 11/8, calcitriol 1.5 Recent labs: hgb 8.9 11/21, 10.1 11/15, 10.8 11/1, tsat 19% in Oct given 500 venofer and 50 per week tsat 21% November ferritin 1115 October, iPTH 400 Ca/P good  Assessment/Plan: 1.  Symptomatic anemia with heme + stools - GI has seen. Plan conservative tx unless acute GI bleed;  He was supposed to get a second unit in dialysis but did not. Recheck this am - hgb 7.2 last pm after 1 unit - still unsteady on his feet this am, will give 1 unit today and 1-2 more tomorrow on HD as needed.  GI has seen and signed off: no evidence acute/ subacute GIB; colon/ EGD last year showed diverticulosis only, adjust ESA as needed. To get one more unit prbc's today.   2. ESRD -  MWF - next HD Friday am first round in anticipation of d/c post HD 3.  Hypertension/volume  - volume control with dialysis; UF 1.1 L 11/29 with post wt not done - should have been about 66.9 based on pre wt- got off HD at 4 am Thursday- goal EDW 67 4.  Anemia  - hgb 7.1 down from 8.9 11/21 and 10.1 11/15 - transfusion 1 units in the ED  up to 7.2 then continue Fe and ESA at the outpatient unit; transfuse as per #1 5.  Metabolic bone disease -  Binders/Calcitriol 6.  Nutrition - renal/carb mod diet  Myriam Jacobson, PA-C [facility] Bee[facility] [phone] 10/05/2016,9:38 AM  LOS: 0 days   Pt seen, examined, agree w assess/plan as above with additions as indicated.  Kelly Splinter MD [facility] pager [phone]    cell [phone] 10/05/2016, 10:19 AM      Subjective:   Up at the sink with walk - wife helping - still a little unsteady   Objective Vitals:   10/05/16 0415 10/05/16 0418 10/05/16 0501 10/05/16 0931  BP: (!) 95/49 (!) 102/56 (!) 121/47 (!) 102/44  Pulse: 93 96 90 91  Resp: 16 16 16 16    Temp:   98 F (36.7 C) 99.4 F (37.4 C)  TempSrc:   Oral Oral  SpO2:   97% 98%  Weight:      Height:       Physical Exam General: thin elderly NAD on room air Heart: RRR Lungs: no rales Abdomen: soft  Extremities: no LE edema Dialysis Access: left lower AVGG + bruit   Additional Objective Labs: Basic Metabolic Panel:  Recent Labs Lab 10/04/16 0639 10/05/16 0005  NA 137 137  K 3.9 4.3  CL 93* 98*  CO2 29 28  GLUCOSE 111* 91  BUN 36* 49*  CREATININE 7.51* 9.26*  CALCIUM 8.3* 8.4*   Liver Function Tests:  Recent Labs Lab 10/04/16 0639 10/05/16 0005  AST 24 14*  ALT 11* 9*  ALKPHOS 52 44  BILITOT 0.2* 0.2*  PROT 5.6* 5.1*  ALBUMIN 3.2* 2.9*   No results for input(s): LIPASE, AMYLASE in the last 168 hours. CBC:  Recent Labs Lab 10/04/16 0639 10/05/16 0005  WBC 8.4 7.9  NEUTROABS 6.5  --   HGB 7.0* 7.2*  HCT 21.5* 22.2*  MCV 97.7 89.2  PLT 225 182   Blood Culture    Component Value Date/Time   SDES URINE, CLEAN CATCH  12/07/2015 2145   [place] NONE 12/07/2015 2145   CULT NO GROWTH 1 DAY 12/07/2015 2145   REPTSTATUS 12/08/2015 FINAL 12/07/2015 2145    Cardiac Enzymes:  Recent Labs Lab 10/04/16 0805 10/04/16 1817 10/05/16 0005  TROPONINI 0.08* 0.10* 0.11*   CBG: No results for input(s): GLUCAP in the last 168 hours. Iron Studies: No results for input(s): IRON, TIBC, TRANSFERRIN, FERRITIN in the last 72 hours. Lab Results  Component Value Date   INR 1.24 10/05/2016   INR 1.36 12/06/2015   INR 1.23 07/05/2015   Studies/Results: No results found. Medications:  . allopurinol  100 mg Oral Daily  . calcitRIOL  1 mcg Oral Q M,W,F-HD  . citalopram  10 mg Oral Daily  . docusate sodium  200 mg Oral BID  . midodrine  5 mg Oral TID  . rosuvastatin  20 mg Oral QHS  . sodium chloride flush  3 mL Intravenous Q12H

## 2016-10-06 DIAGNOSIS — D62 Acute posthemorrhagic anemia: Secondary | ICD-10-CM

## 2016-10-06 DIAGNOSIS — R9431 Abnormal electrocardiogram [ECG] [EKG]: Secondary | ICD-10-CM

## 2016-10-06 LAB — CBC
HCT: 25.1 % — ABNORMAL LOW (ref 39.0–52.0)
HCT: 26.6 % — ABNORMAL LOW (ref 39.0–52.0)
Hemoglobin: 8.2 g/dL — ABNORMAL LOW (ref 13.0–17.0)
Hemoglobin: 8.6 g/dL — ABNORMAL LOW (ref 13.0–17.0)
MCH: 28.6 pg (ref 26.0–34.0)
MCH: 28.2 pg (ref 26.0–34.0)
MCHC: 32.7 g/dL (ref 30.0–36.0)
MCHC: 32.3 g/dL (ref 30.0–36.0)
MCV: 87.5 fL (ref 78.0–100.0)
MCV: 87.2 fL (ref 78.0–100.0)
Platelets: 173 10*3/uL (ref 150–400)
Platelets: 189 10*3/uL (ref 150–400)
RBC: 2.87 MIL/uL — ABNORMAL LOW (ref 4.22–5.81)
RBC: 3.05 MIL/uL — ABNORMAL LOW (ref 4.22–5.81)
RDW: 22.7 % — ABNORMAL HIGH (ref 11.5–15.5)
RDW: 22.4 % — ABNORMAL HIGH (ref 11.5–15.5)
WBC: 8 10*3/uL (ref 4.0–10.5)
WBC: 8.1 10*3/uL (ref 4.0–10.5)

## 2016-10-06 LAB — TYPE AND SCREEN
ABO/RH(D): O POS
Antibody Screen: NEGATIVE
Unit division: 0
Unit division: 0

## 2016-10-06 LAB — TROPONIN I: Troponin I: 0.1 ng/mL

## 2016-10-06 LAB — HEPATITIS B SURFACE ANTIGEN: Hepatitis B Surface Ag: NEGATIVE

## 2016-10-06 LAB — RENAL FUNCTION PANEL
Albumin: 2.8 g/dL — ABNORMAL LOW (ref 3.5–5.0)
Anion gap: 12 (ref 5–15)
BUN: 36 mg/dL — ABNORMAL HIGH (ref 6–20)
CO2: 29 mmol/L (ref 22–32)
Calcium: 8.6 mg/dL — ABNORMAL LOW (ref 8.9–10.3)
Chloride: 95 mmol/L — ABNORMAL LOW (ref 101–111)
Creatinine, Ser: 7.32 mg/dL — ABNORMAL HIGH (ref 0.61–1.24)
GFR calc Af Amer: 7 mL/min — ABNORMAL LOW
GFR calc non Af Amer: 6 mL/min — ABNORMAL LOW
Glucose, Bld: 93 mg/dL (ref 65–99)
Phosphorus: 5.5 mg/dL — ABNORMAL HIGH (ref 2.5–4.6)
Potassium: 4 mmol/L (ref 3.5–5.1)
Sodium: 136 mmol/L (ref 135–145)

## 2016-10-06 MED ORDER — HEPARIN SODIUM (PORCINE) 1000 UNIT/ML DIALYSIS: 1000.0000 [IU] | INTRAMUSCULAR | Status: DC | PRN

## 2016-10-06 MED ORDER — ALTEPLASE 2 MG IJ SOLR: 2.0000 mg | Freq: Once | INTRAMUSCULAR | Status: DC | PRN

## 2016-10-06 MED ORDER — CALCITRIOL 0.5 MCG PO CAPS: 1.0000 ug | ORAL_CAPSULE | ORAL | 0 refills | Status: DC

## 2016-10-06 MED ORDER — MIDODRINE HCL 5 MG PO TABS
ORAL_TABLET | ORAL | Status: AC
  Filled 2016-10-06: qty 1

## 2016-10-06 MED ORDER — SODIUM CHLORIDE 0.9 % IV SOLN: 100.0000 mL | INTRAVENOUS | Status: DC | PRN

## 2016-10-06 MED ORDER — LIDOCAINE-PRILOCAINE 2.5-2.5 % EX CREA: 1.0000 "application " | TOPICAL_CREAM | CUTANEOUS | Status: DC | PRN

## 2016-10-06 MED ORDER — PENTAFLUOROPROP-TETRAFLUOROETH EX AERO: 1.0000 "application " | INHALATION_SPRAY | CUTANEOUS | Status: DC | PRN

## 2016-10-06 MED ORDER — LIDOCAINE HCL (PF) 1 % IJ SOLN: 5.0000 mL | INTRAMUSCULAR | Status: DC | PRN

## 2016-10-06 NOTE — Care Management Note (Signed)
Case Management Note  Patient Details  Name: [name] MRN: [id number] Date of Birth: 22-Jul-1929  Subjective/Objective:     CM following for progression and d/c planning.                Action/Plan: 10/06/2016 Met with pt wife, no d/c needs identified.   Expected Discharge Date:    10/06/2016              Expected Discharge Plan:  Home/Self Care  In-House Referral:  NA  Discharge planning Services  NA  Post Acute Care Choice:  NA Choice offered to:  NA  DME Arranged:  N/A DME Agency:  NA  HH Arranged:  NA HH Agency:  NA  Status of Service:  Completed, signed off  If discussed at [place] of Stay Meetings, dates discussed:    Additional Comments:  Adron Bene, RN 10/06/2016, 2:59 PM

## 2016-10-06 NOTE — Procedures (Signed)
On HD , stable.  1kg over dry wt, will try to lower dry wt to 66kg as tolerated.  On midodrine for BP support, chron hypotension.  No further w/u per GI, recent extensive w/u.  Hb stable after prbc's at 8.6 today. Probable dc today, have d/w primary.    I was present at this dialysis session, have reviewed the session itself and made  appropriate changes Kelly Splinter MD [place] pager [phone]   10/06/2016, 9:43 AM

## 2016-10-06 NOTE — Care Management Obs Status (Addendum)
[place] NOTIFICATION   Patient Details  Name: [name] MRN: [id number] Date of Birth: 03/14/1929   Medicare Observation Status Notification Given:  Yes Multiple attempts to deliver OBS notification on 10/05/2016 , left for pt today, with not and phone number to call CM for clarification.  Spoke with pt wife and OBS status explained to her in detail , she verbalized understanding.   Jaquez Farrington, Rory Percy, RN 10/06/2016, 11:19 AM

## 2016-10-06 NOTE — Progress Notes (Signed)
Nikki Dom Membreno to be D/C'd Home per MD order.  Discussed prescriptions and follow up appointments with the patient. Prescriptions given to patient, medication list explained in detail. Pt verbalized understanding.    Medication List    STOP taking these medications   aspirin 81 MG tablet     TAKE these medications   allopurinol 100 MG tablet Commonly known as:  ZYLOPRIM TAKE 1 TABLET DAILY   calcitRIOL 0.5 MCG capsule Commonly known as:  ROCALTROL Take 2 capsules (1 mcg total) by mouth every Monday, Wednesday, and Friday with hemodialysis.   citalopram 10 MG tablet Commonly known as:  CELEXA Take 1 tablet (10 mg total) by mouth daily.   docusate sodium 100 MG capsule Commonly known as:  COLACE Take 2 capsules (200 mg total) by mouth 2 (two) times daily.   doxercalciferol 0.5 MCG capsule Commonly known as:  HECTOROL Take 1.5 mcg by mouth 3 (three) times a week. MWF   midodrine 5 MG tablet Commonly known as:  PROAMATINE Take 1 tablet (5 mg total) by mouth 3 (three) times daily. Take in the morning, lunch time and 4pm. Do not take after 4pm.   multivitamin with minerals Tabs tablet Take 1 tablet by mouth daily.   nystatin powder Commonly known as:  MYCOSTATIN/NYSTOP Use as directed twice per day   rosuvastatin 20 MG tablet Commonly known as:  CRESTOR Take 1 tablet (20 mg total) by mouth at bedtime.       Vitals:   10/06/16 1100 10/06/16 1130  BP: (!) 100/53 (!) 114/54  Pulse: 84 84  Resp:  20  Temp:  98.1 F (36.7 C)    Skin clean, dry and intact without evidence of skin break down, no evidence of skin tears noted. IV catheter discontinued intact. Site without signs and symptoms of complications. Dressing and pressure applied. Pt denies pain at this time. No complaints noted.  An After Visit Summary was printed and given to the patient. Patient escorted via [place], and D/C home via private auto.  Emilio Math, RN [facility] Phone [phone]

## 2016-10-06 NOTE — Discharge Summary (Addendum)
Physician Discharge Summary  [name] [place]  JKD:[id number]  DOB: Jul 12, 1929  DOA: 10/04/2016 PCP: [name], MD  Admit date: 10/04/2016 Discharge date: 10/06/2016  Admitted From: Home  Disposition:  Home   Recommendations for Outpatient Follow-up:  1. Follow up with PCP in 1-2 weeks 2. Please obtain BMP/CBC in one week 3. Please follow up with gastroenterologist in 2 weeks   Home Health: None  Equipment/Devices: None  Discharge Condition: Stable  CODE STATUS: Full  Diet recommendation: Heart Healthy / Carb Modified  Brief/Interim Summary: [name] a 82 y.o.malewith medical history significant for ESRD on HD MWF, and a history of chronic anemia on Mircera and Venofer (7 in November alone) presented with increasing fatigue, lightheadedness. Patient was admitted for symptomatic anemia with Hb of 6.2. He was transfuse 2 units of PRBC's. Subsequently patients symptoms improved. Hb up to 8.6. Patient had his schedule dialysis during the hospital stay. Chronic problems remained stable. Patient was walked around with physical therapy, which he tolerated well. Patient will be discharge home with PCP and gastroenterology follow up   Subjective: Patient seen during dialysis, report he feels is back to baseline. Patient have no new complaints   Discharge Diagnoses:  Symptomatic anemia, acute on chronic Iron deficiency in setting of ESRD, s/p 2 unit PRBC, positive FOB - resolved, Hb up to 8.6 GI evaluate and signed off, patient with extensive work up as outpatient with only diverticulosis.  Iron supplementation with HD  Holding aspirin until discussed with PCP  Follow up with PCP in 1 week  Follow up with gastroenterology  ESRD on HD MWF  Continue HD as recommended   Abnormal EKG and mild elevated troponin - Patient remains asymptomatic Secondary to demand ischemia, Lateral lead with slight ST depressions TNI's remains flat - likely from renal disease No further cardiac  workup at this time   Hypertension - stable  Continue home meds  Discharge Instructions  Discharge Instructions    Call MD for:  difficulty breathing, headache or visual disturbances    Complete by:  As directed    Call MD for:  extreme fatigue    Complete by:  As directed    Call MD for:  hives    Complete by:  As directed    Call MD for:  persistant dizziness or light-headedness    Complete by:  As directed    Call MD for:  persistant nausea and vomiting    Complete by:  As directed    Call MD for:  redness, tenderness, or signs of infection (pain, swelling, redness, odor or green/yellow discharge around incision site)    Complete by:  As directed    Call MD for:  severe uncontrolled pain    Complete by:  As directed    Call MD for:  temperature >100.4    Complete by:  As directed    Diet - low sodium heart healthy    Complete by:  As directed    Discharge instructions    Complete by:  As directed    Increase activity slowly    Complete by:  As directed        Medication List    STOP taking these medications   aspirin 81 MG tablet     TAKE these medications   allopurinol 100 MG tablet Commonly known as:  ZYLOPRIM TAKE 1 TABLET DAILY   calcitRIOL 0.5 MCG capsule Commonly known as:  ROCALTROL Take 2 capsules (1 mcg total) by mouth  every Monday, Wednesday, and Friday with hemodialysis.   citalopram 10 MG tablet Commonly known as:  CELEXA Take 1 tablet (10 mg total) by mouth daily.   docusate sodium 100 MG capsule Commonly known as:  COLACE Take 2 capsules (200 mg total) by mouth 2 (two) times daily.   doxercalciferol 0.5 MCG capsule Commonly known as:  HECTOROL Take 1.5 mcg by mouth 3 (three) times a week. MWF   midodrine 5 MG tablet Commonly known as:  PROAMATINE Take 1 tablet (5 mg total) by mouth 3 (three) times daily. Take in the morning, lunch time and 4pm. Do not take after 4pm.   multivitamin with minerals Tabs tablet Take 1 tablet by mouth  daily.   nystatin powder Commonly known as:  MYCOSTATIN/NYSTOP Use as directed twice per day   rosuvastatin 20 MG tablet Commonly known as:  CRESTOR Take 1 tablet (20 mg total) by mouth at bedtime.       No Known Allergies  Consultations:  [facility]   Nephrology Dr Jonnie Finner    Procedures/Studies: No results found.   Discharge Exam: Vitals:   10/06/16 1100 10/06/16 1130  BP: (!) 100/53 (!) 114/54  Pulse: 84 84  Resp:  20  Temp:  98.1 F (36.7 C)   Vitals:   10/06/16 1000 10/06/16 1030 10/06/16 1100 10/06/16 1130  BP: (!) 96/49 (!) 97/49 (!) 100/53 (!) 114/54  Pulse: 84 84 84 84  Resp:    20  Temp:    98.1 F (36.7 C)  TempSrc:    Oral  SpO2:    100%  Weight:    65.5 kg (144 lb 6.4 oz)  Height:        General: Pt is alert, awake, not in acute distress Cardiovascular: RRR, S1/S2 +, no rubs, no gallops Respiratory: CTA bilaterally, no wheezing, no rhonchi Abdominal: Soft, NT, ND, bowel sounds + Extremities: no edema, no cyanosis, fistula in left forearm     The results of significant diagnostics from this hospitalization (including imaging, microbiology, ancillary and laboratory) are listed below for reference.     Microbiology: Recent Results (from the past 240 hour(s))  Urine culture     Status: Abnormal   Collection Time: 10/04/16  1:44 PM  Result Value Ref Range Status   Specimen Description URINE, RANDOM  Final   Special Requests NONE  Final   Culture <10,000 COLONIES/mL INSIGNIFICANT GROWTH (A)  Final   Report Status 10/05/2016 FINAL  Final  MRSA PCR Screening     Status: None   Collection Time: 10/04/16  6:27 PM  Result Value Ref Range Status   MRSA by PCR NEGATIVE NEGATIVE Final    Comment:        The GeneXpert MRSA Assay (FDA approved for NASAL specimens only), is one component of a comprehensive MRSA colonization surveillance program. It is not intended to diagnose MRSA infection nor to guide or monitor treatment for MRSA  infections.      Labs: BNP (last 3 results) No results for input(s): BNP in the last 8760 hours. Basic Metabolic Panel:  Recent Labs Lab 10/04/16 0639 10/05/16 0005 10/06/16 0647  NA 137 137 136  K 3.9 4.3 4.0  CL 93* 98* 95*  CO2 29 28 29   GLUCOSE 111* 91 93  BUN 36* 49* 36*  CREATININE 7.51* 9.26* 7.32*  CALCIUM 8.3* 8.4* 8.6*  PHOS  --   --  5.5*   Liver Function Tests:  Recent Labs Lab 10/04/16 0639 10/05/16 0005  10/06/16 0647  AST 24 14*  --   ALT 11* 9*  --   ALKPHOS 52 44  --   BILITOT 0.2* 0.2*  --   PROT 5.6* 5.1*  --   ALBUMIN 3.2* 2.9* 2.8*   No results for input(s): LIPASE, AMYLASE in the last 168 hours. No results for input(s): AMMONIA in the last 168 hours. CBC:  Recent Labs Lab 10/04/16 0639 10/05/16 0005 10/06/16 0443 10/06/16 0647  WBC 8.4 7.9 8.0 8.1  NEUTROABS 6.5  --   --   --   HGB 7.0* 7.2* 8.2* 8.6*  HCT 21.5* 22.2* 25.1* 26.6*  MCV 97.7 89.2 87.5 87.2  PLT 225 182 173 189   Cardiac Enzymes:  Recent Labs Lab 10/04/16 0805 10/04/16 1817 10/05/16 0005 10/06/16 0443  TROPONINI 0.08* 0.10* 0.11* 0.10*   BNP: Invalid input(s): POCBNP CBG: No results for input(s): GLUCAP in the last 168 hours. D-Dimer No results for input(s): DDIMER in the last 72 hours. Hgb A1c No results for input(s): HGBA1C in the last 72 hours. Lipid Profile No results for input(s): CHOL, HDL, LDLCALC, TRIG, CHOLHDL, LDLDIRECT in the last 72 hours. Thyroid function studies No results for input(s): TSH, T4TOTAL, T3FREE, THYROIDAB in the last 72 hours.  Invalid input(s): FREET3 Anemia work up No results for input(s): VITAMINB12, FOLATE, FERRITIN, TIBC, IRON, RETICCTPCT in the last 72 hours. Urinalysis    Component Value Date/Time   COLORURINE YELLOW 10/04/2016 1344   APPEARANCEUR CLEAR 10/04/2016 1344   LABSPEC 1.009 10/04/2016 1344   PHURINE 8.5 (H) 10/04/2016 1344   GLUCOSEU NEGATIVE 10/04/2016 1344   HGBUR NEGATIVE 10/04/2016 1344    BILIRUBINUR NEGATIVE 10/04/2016 1344   KETONESUR NEGATIVE 10/04/2016 1344   PROTEINUR 100 (A) 10/04/2016 1344   UROBILINOGEN 0.2 07/06/2015 0003   NITRITE NEGATIVE 10/04/2016 1344   LEUKOCYTESUR NEGATIVE 10/04/2016 1344   Sepsis Labs Invalid input(s): PROCALCITONIN,  WBC,  LACTICIDVEN Microbiology Recent Results (from the past 240 hour(s))  Urine culture     Status: Abnormal   Collection Time: 10/04/16  1:44 PM  Result Value Ref Range Status   Specimen Description URINE, RANDOM  Final   Special Requests NONE  Final   Culture <10,000 COLONIES/mL INSIGNIFICANT GROWTH (A)  Final   Report Status 10/05/2016 FINAL  Final  MRSA PCR Screening     Status: None   Collection Time: 10/04/16  6:27 PM  Result Value Ref Range Status   MRSA by PCR NEGATIVE NEGATIVE Final    Comment:        The GeneXpert MRSA Assay (FDA approved for NASAL specimens only), is one component of a comprehensive MRSA colonization surveillance program. It is not intended to diagnose MRSA infection nor to guide or monitor treatment for MRSA infections.     Time coordinating discharge: Over 30 minutes  SIGNED:  Chipper Oman, MD  [facility] 10/06/2016, 3:06 PM Pager   If 7PM-7AM, please contact night-coverage [email or web address] Password [email or web address]

## 2016-10-06 NOTE — Progress Notes (Signed)
Pt walked to RN station. No weakness noted, pt denied any pain or discomfort.   Paulla Fore, RN

## 2016-10-07 LAB — TROPONIN I: TROPONIN I: 0.08 ng/mL — AB (ref <0.03)

## 2016-10-07 LAB — HEPATITIS B SURFACE ANTIBODY,QUALITATIVE: Hep B S Ab: NONREACTIVE

## 2016-10-07 LAB — HEPATITIS B CORE ANTIBODY, TOTAL: HEP B C TOTAL AB: NEGATIVE

## 2016-10-09 DIAGNOSIS — D631 Anemia in chronic kidney disease: Secondary | ICD-10-CM | POA: Diagnosis not present

## 2016-10-09 DIAGNOSIS — D509 Iron deficiency anemia, unspecified: Secondary | ICD-10-CM | POA: Diagnosis not present

## 2016-10-09 DIAGNOSIS — N2581 Secondary hyperparathyroidism of renal origin: Secondary | ICD-10-CM | POA: Diagnosis not present

## 2016-10-09 DIAGNOSIS — N186 End stage renal disease: Secondary | ICD-10-CM | POA: Diagnosis not present

## 2016-10-11 DIAGNOSIS — N2581 Secondary hyperparathyroidism of renal origin: Secondary | ICD-10-CM | POA: Diagnosis not present

## 2016-10-11 DIAGNOSIS — N186 End stage renal disease: Secondary | ICD-10-CM | POA: Diagnosis not present

## 2016-10-11 DIAGNOSIS — D509 Iron deficiency anemia, unspecified: Secondary | ICD-10-CM | POA: Diagnosis not present

## 2016-10-11 DIAGNOSIS — D631 Anemia in chronic kidney disease: Secondary | ICD-10-CM | POA: Diagnosis not present

## 2016-10-12 ENCOUNTER — Encounter: Payer: Self-pay | Admitting: Internal Medicine

## 2016-10-12 ENCOUNTER — Other Ambulatory Visit (INDEPENDENT_AMBULATORY_CARE_PROVIDER_SITE_OTHER): Payer: Medicare Other

## 2016-10-12 ENCOUNTER — Ambulatory Visit (INDEPENDENT_AMBULATORY_CARE_PROVIDER_SITE_OTHER): Payer: Medicare Other | Admitting: Internal Medicine

## 2016-10-12 VITALS — BP 120/68 | HR 66 | Temp 98.0°F | Resp 20 | Wt 149.0 lb

## 2016-10-12 DIAGNOSIS — D62 Acute posthemorrhagic anemia: Secondary | ICD-10-CM | POA: Diagnosis not present

## 2016-10-12 DIAGNOSIS — I63311 Cerebral infarction due to thrombosis of right middle cerebral artery: Secondary | ICD-10-CM

## 2016-10-12 DIAGNOSIS — I10 Essential (primary) hypertension: Secondary | ICD-10-CM

## 2016-10-12 DIAGNOSIS — C61 Malignant neoplasm of prostate: Secondary | ICD-10-CM | POA: Diagnosis not present

## 2016-10-12 DIAGNOSIS — D509 Iron deficiency anemia, unspecified: Secondary | ICD-10-CM | POA: Diagnosis not present

## 2016-10-12 LAB — CBC WITH DIFFERENTIAL/PLATELET
Basophils Absolute: 0 10*3/uL (ref 0.0–0.1)
Basophils Relative: 0.3 % (ref 0.0–3.0)
Eosinophils Absolute: 0.1 10*3/uL (ref 0.0–0.7)
Eosinophils Relative: 1.3 % (ref 0.0–5.0)
HCT: 29.5 % — ABNORMAL LOW (ref 39.0–52.0)
Hemoglobin: 9.6 g/dL — ABNORMAL LOW (ref 13.0–17.0)
Lymphocytes Relative: 12.4 % (ref 12.0–46.0)
Lymphs Abs: 1.1 10*3/uL (ref 0.7–4.0)
MCHC: 32.6 g/dL (ref 30.0–36.0)
MCV: 87.5 fl (ref 78.0–100.0)
Monocytes Absolute: 1 10*3/uL (ref 0.1–1.0)
Monocytes Relative: 11.5 % (ref 3.0–12.0)
Neutro Abs: 6.8 10*3/uL (ref 1.4–7.7)
Neutrophils Relative %: 74.5 % (ref 43.0–77.0)
Platelets: 447 10*3/uL — ABNORMAL HIGH (ref 150.0–400.0)
RBC: 3.37 Mil/uL — ABNORMAL LOW (ref 4.22–5.81)
RDW: 21.7 % — ABNORMAL HIGH (ref 11.5–15.5)
WBC: 9.1 10*3/uL (ref 4.0–10.5)

## 2016-10-12 NOTE — Progress Notes (Signed)
Pre visit review using our clinic review tool, if applicable. No additional management support is needed unless otherwise documented below in the visit note. 

## 2016-10-12 NOTE — Patient Instructions (Signed)
Please continue off the aspirin for now  Please continue all other medications as before, and refills have been done if requested.  Please have the pharmacy call with any other refills you may need.  Please continue your efforts at being more active, low cholesterol diet, and weight control.  Please keep your appointments with your specialists as you may have planned  Please go to the LAB in the Basement (turn left off the elevator) for the tests to be done today, as well as a Monthly CBC after today to continue to monitor  You will be contacted by phone if any changes need to be made immediately.  Otherwise, you will receive a letter about your results with an explanation, but please check with MyChart first.  Please remember to sign up for MyChart if you have not done so, as this will be important to you in the future with finding out test results, communicating by private email, and scheduling acute appointments online when needed.  Please return in 6 months, or sooner if needed

## 2016-10-12 NOTE — Progress Notes (Signed)
Subjective:    Patient ID: [name], male    DOB: 1929/03/25, 82 y.o.   MRN: [id number]  HPI  Here to f/u recent hospn with ABL anemia on chronic renal related and low iron, admitted with symptoms, transfused 2 u PRBC, cont'd with HD while hospd, Currently not taking the asa for No BRBPR or melena..  Has hx of transfusion several years ago as well.  Seen per GI, has already completed extensive eval as outpt, for iron supplementation with HD, due for f/u cbc,  No overt bleeding post hospn. Denies worsening reflux, abd pain, dysphagia, n/v, bowel change or blood.  Pt denies chest pain, increased sob or doe, wheezing, orthopnea, PND, increased LE swelling, palpitations, dizziness or syncope, except for intermittent lower BP, dizzy and weakness occurring on primarily HD days.  Pt denies new neurological symptoms such as new headache, or facial or extremity weakness or numbness Past Medical History:  Diagnosis Date  . ANEMIA-IRON DEFICIENCY 03/09/2008  . Arthritis    cervical spine.   . Complex renal cyst 06/19/2011  . Depression 03/26/2014  . DIVERTICULOSIS, COLON 03/09/2008  . ESRD (end stage renal disease) ([place]) 03/09/2008  . GOUT 03/09/2008  . Hemorrhoids 08/2009   internal.   . History of blood transfusion   . HYPERLIPIDEMIA 03/09/2008  . Hyperparathyroidism ([place])   . HYPERTENSION 03/09/2008  . Prostate cancer [facility]) 2002   Completed external beam radiation 2003.per HPI  . PROSTATE CANCER, HX OF 03/09/2008  . Radiation cystitis 2010.  Marland Kitchen Stroke [facility])    Past Surgical History:  Procedure Laterality Date  . AV FISTULA PLACEMENT Left 01/24/2013   Procedure: INSERTION OF ARTERIOVENOUS (AV) GORE-TEX GRAFT ARM;  Surgeon: Rosetta Posner, MD;  Location: [facility];  Service: Vascular;  Laterality: Left;  . COLONOSCOPY N/A 06/17/2015   Procedure: COLONOSCOPY;  Surgeon: Gatha Mayer, MD;  Location: [facility];  Service: Endoscopy;  Laterality: N/A;  . ESOPHAGOGASTRODUODENOSCOPY N/A 06/17/2015   Procedure:  ESOPHAGOGASTRODUODENOSCOPY (EGD);  Surgeon: Gatha Mayer, MD;  Location: [facility] ENDOSCOPY;  Service: Endoscopy;  Laterality: N/A;  . FLEXIBLE SIGMOIDOSCOPY N/A 06/15/2015   Procedure: FLEXIBLE SIGMOIDOSCOPY;  Surgeon: Gatha Mayer, MD;  Location: [facility];  Service: Endoscopy;  Laterality: N/A;  . GIVENS CAPSULE STUDY N/A 07/06/2015   Procedure: GIVENS CAPSULE STUDY;  Surgeon: Milus Banister, MD;  Location: [facility];  Service: Endoscopy;  Laterality: N/A;  . JOINT REPLACEMENT     HIP  . rotator cuff repair right  4/08  . s/p left hip replacement  2007   Dr. Percell Miller ortho  . TONSILLECTOMY      reports that he quit smoking about 34 years ago. His smoking use included Cigarettes. He quit after 5.00 years of use. He has never used smokeless tobacco. He reports that he does not drink alcohol or use drugs. family history includes Cancer in his brother and father; Diabetes in his father; Hypertension in his brother and father. No Known Allergies Current Outpatient Prescriptions on File Prior to Visit  Medication Sig Dispense Refill  . allopurinol (ZYLOPRIM) 100 MG tablet TAKE 1 TABLET DAILY 90 tablet 3  . calcitRIOL (ROCALTROL) 0.5 MCG capsule Take 2 capsules (1 mcg total) by mouth every Monday, Wednesday, and Friday with hemodialysis. 25 capsule 0  . citalopram (CELEXA) 10 MG tablet Take 1 tablet (10 mg total) by mouth daily. 90 tablet 3  . docusate sodium (COLACE) 100 MG capsule Take 2 capsules (200 mg total) by mouth  2 (two) times daily. 60 capsule 0  . doxercalciferol (HECTOROL) 0.5 MCG capsule Take 1.5 mcg by mouth 3 (three) times a week. MWF    . midodrine (PROAMATINE) 5 MG tablet Take 1 tablet (5 mg total) by mouth 3 (three) times daily. Take in the morning, lunch time and 4pm. Do not take after 4pm. 90 tablet 3  . Multiple Vitamin (MULTIVITAMIN WITH MINERALS) TABS tablet Take 1 tablet by mouth daily.    Marland Kitchen nystatin (MYCOSTATIN/NYSTOP) powder Use as directed twice per day 45 g 1  .  rosuvastatin (CRESTOR) 20 MG tablet Take 1 tablet (20 mg total) by mouth at bedtime. 90 tablet 3   No current facility-administered medications on file prior to visit.    Review of Systems  Constitutional: Negative for unusual diaphoresis or night sweats HENT: Negative for ear swelling or discharge Eyes: Negative for worsening visual haziness  Respiratory: Negative for choking and stridor.   Gastrointestinal: Negative for distension or worsening eructation Genitourinary: Negative for retention or change in urine volume.  Musculoskeletal: Negative for other MSK pain or swelling Skin: Negative for color change and worsening wound Neurological: Negative for tremors and numbness other than noted  Psychiatric/Behavioral: Negative for decreased concentration or agitation other than above   All other system neg per pt    Objective:   Physical Exam BP 120/68   Pulse 66   Temp 98 F (36.7 C) (Oral)   Resp 20   Wt 149 lb (67.6 kg)   SpO2 97%   BMI 22.00 kg/m  VS noted,  Constitutional: Pt appears in no apparent distress HENT: Head: NCAT.  Right Ear: External ear normal.  Left Ear: External ear normal.  Eyes: . Pupils are equal, round, and reactive to light. Conjunctivae and EOM are normal Neck: Normal range of motion. Neck supple.  Cardiovascular: Normal rate and regular rhythm.   Pulmonary/Chest: Effort normal and breath sounds without rales or wheezing.  Abd:  Soft, NT, ND, + BS Neurological: Pt is alert. Not confused , motor grossly intact Skin: Skin is warm. No rash, no LE edema Psychiatric: Pt behavior is normal. No agitation.  No other new exam findings    Assessment & Plan:

## 2016-10-13 DIAGNOSIS — D509 Iron deficiency anemia, unspecified: Secondary | ICD-10-CM | POA: Diagnosis not present

## 2016-10-13 DIAGNOSIS — N186 End stage renal disease: Secondary | ICD-10-CM | POA: Diagnosis not present

## 2016-10-13 DIAGNOSIS — D631 Anemia in chronic kidney disease: Secondary | ICD-10-CM | POA: Diagnosis not present

## 2016-10-13 DIAGNOSIS — N2581 Secondary hyperparathyroidism of renal origin: Secondary | ICD-10-CM | POA: Diagnosis not present

## 2016-10-14 NOTE — Assessment & Plan Note (Addendum)
stable overall by history and exam, recent data reviewed with pt, and pt to continue medical treatment as before,  to f/u any worsening symptoms or concerns Lab Results  Component Value Date   WBC 9.1 10/12/2016   HGB 9.6 (L) 10/12/2016   HCT 29.5 (L) 10/12/2016   MCV 87.5 10/12/2016   PLT 447.0 (H) 10/12/2016  also for monthly cbc for now for surveillance, pt and wife decline asa restart for now

## 2016-10-14 NOTE — Assessment & Plan Note (Signed)
stable overall by history and exam, recent data reviewed with pt, and pt to continue medical treatment as before,  to f/u any worsening symptoms or concerns' BP Readings from Last 3 Encounters:  10/12/16 120/68  10/06/16 (!) 114/54  09/07/16 (!) 98/49  cont current meds

## 2016-10-14 NOTE — Assessment & Plan Note (Signed)
To cont iron with HD,  to f/u any worsening symptoms or concerns

## 2016-10-16 DIAGNOSIS — N2581 Secondary hyperparathyroidism of renal origin: Secondary | ICD-10-CM | POA: Diagnosis not present

## 2016-10-16 DIAGNOSIS — N186 End stage renal disease: Secondary | ICD-10-CM | POA: Diagnosis not present

## 2016-10-16 DIAGNOSIS — D631 Anemia in chronic kidney disease: Secondary | ICD-10-CM | POA: Diagnosis not present

## 2016-10-16 DIAGNOSIS — D509 Iron deficiency anemia, unspecified: Secondary | ICD-10-CM | POA: Diagnosis not present

## 2016-10-17 ENCOUNTER — Ambulatory Visit: Payer: Medicare Other | Admitting: Physician Assistant

## 2016-10-17 ENCOUNTER — Other Ambulatory Visit: Payer: Self-pay

## 2016-10-18 DIAGNOSIS — D631 Anemia in chronic kidney disease: Secondary | ICD-10-CM | POA: Diagnosis not present

## 2016-10-18 DIAGNOSIS — N2581 Secondary hyperparathyroidism of renal origin: Secondary | ICD-10-CM | POA: Diagnosis not present

## 2016-10-18 DIAGNOSIS — N186 End stage renal disease: Secondary | ICD-10-CM | POA: Diagnosis not present

## 2016-10-18 DIAGNOSIS — D509 Iron deficiency anemia, unspecified: Secondary | ICD-10-CM | POA: Diagnosis not present

## 2016-10-19 ENCOUNTER — Other Ambulatory Visit: Payer: Self-pay | Admitting: Urology

## 2016-10-19 DIAGNOSIS — C61 Malignant neoplasm of prostate: Secondary | ICD-10-CM

## 2016-10-19 DIAGNOSIS — N3941 Urge incontinence: Secondary | ICD-10-CM | POA: Diagnosis not present

## 2016-10-19 DIAGNOSIS — E291 Testicular hypofunction: Secondary | ICD-10-CM | POA: Diagnosis not present

## 2016-10-20 DIAGNOSIS — N186 End stage renal disease: Secondary | ICD-10-CM | POA: Diagnosis not present

## 2016-10-20 DIAGNOSIS — N2581 Secondary hyperparathyroidism of renal origin: Secondary | ICD-10-CM | POA: Diagnosis not present

## 2016-10-20 DIAGNOSIS — D509 Iron deficiency anemia, unspecified: Secondary | ICD-10-CM | POA: Diagnosis not present

## 2016-10-20 DIAGNOSIS — D631 Anemia in chronic kidney disease: Secondary | ICD-10-CM | POA: Diagnosis not present

## 2016-10-23 DIAGNOSIS — D509 Iron deficiency anemia, unspecified: Secondary | ICD-10-CM | POA: Diagnosis not present

## 2016-10-23 DIAGNOSIS — N2581 Secondary hyperparathyroidism of renal origin: Secondary | ICD-10-CM | POA: Diagnosis not present

## 2016-10-23 DIAGNOSIS — N186 End stage renal disease: Secondary | ICD-10-CM | POA: Diagnosis not present

## 2016-10-23 DIAGNOSIS — D631 Anemia in chronic kidney disease: Secondary | ICD-10-CM | POA: Diagnosis not present

## 2016-10-24 ENCOUNTER — Encounter: Payer: Self-pay | Admitting: Physician Assistant

## 2016-10-24 ENCOUNTER — Ambulatory Visit (INDEPENDENT_AMBULATORY_CARE_PROVIDER_SITE_OTHER): Payer: Medicare Other | Admitting: Physician Assistant

## 2016-10-24 VITALS — BP 122/58 | HR 64 | Ht 68.5 in | Wt 148.2 lb

## 2016-10-24 DIAGNOSIS — I63311 Cerebral infarction due to thrombosis of right middle cerebral artery: Secondary | ICD-10-CM | POA: Diagnosis not present

## 2016-10-24 DIAGNOSIS — D509 Iron deficiency anemia, unspecified: Secondary | ICD-10-CM

## 2016-10-24 NOTE — Progress Notes (Signed)
Chief Complaint: Follow-up for chronic IDA  HPI:  [name] is in 82 year old African-American male with past medical history of ESRD, hemodialysis on Monday Wednesday and Friday, hypertension, syncope associated with post dialysis hypotension, prostate cancer in 2002 s/p radiation and radiation cystitis diagnosed in 2010, who returns to clinic today for follow-up after recently being seen in the hospital for acute on chronic symptomatic anemia.   Please recall patient has had a complete workup for this back in 2016 including a flexible sigmoidoscopy 06/15/2015 with left colonic diverticula, and nuclear med bleeding scan 06/16/15 with increased radiotracer uptake in the stomach which raised concern for gastritis, colonoscopy 06/17/59 with a sessile polyps moderate diverticulosis in the left colon, EGD 06/17/15 with nonerosive gastritis and erythema and a Givens capsule endoscopy 07/06/59 with gastritis and duodenitis. It was decided the patient's blood loss was chronic in nature and likely from his kidney disease. He does chronically use Ferric Citrate 2 by mouth 3 times a day with meals and snacks and gets Venofer infusions frequently. Patient was recently admitted from 10/04/16 through 10/06/16 for acute on chronic anemia, he presented with a hemoglobin of 6.2 and was transfused 2 units of PRBCs and his symptoms improved.   Today, the patient presents to clinic accompanied by his wife and tells me that he has done well since his discharge from the hospital, in fact he had his hemoglobin rechecked and this was back up to 9. He describes that he no longer has fatigue or headaches and has noticed no acute GI bleeding. He describes that his stools are always black due to him being on iron chronically.    Patient denies fever, chills, blood in the stool, melena, change in bowel habits, weight loss, fatigue, anorexia, nausea, vomiting, heartburn, reflux or abdominal pain.  Past Medical History:  Diagnosis Date    . ANEMIA-IRON DEFICIENCY 03/09/2008  . Arthritis    cervical spine.   . Complex renal cyst 06/19/2011  . Depression 03/26/2014  . DIVERTICULOSIS, COLON 03/09/2008  . ESRD (end stage renal disease) ([place]) 03/09/2008  . GOUT 03/09/2008  . Hemorrhoids 08/2009   internal.   . History of blood transfusion   . HYPERLIPIDEMIA 03/09/2008  . Hyperparathyroidism ([place])   . HYPERTENSION 03/09/2008  . Prostate cancer [facility]) 2002   Completed external beam radiation 2003.per HPI  . PROSTATE CANCER, HX OF 03/09/2008  . Radiation cystitis 2010.  Marland Kitchen Stroke [facility])     Past Surgical History:  Procedure Laterality Date  . AV FISTULA PLACEMENT Left 01/24/2013   Procedure: INSERTION OF ARTERIOVENOUS (AV) GORE-TEX GRAFT ARM;  Surgeon: Rosetta Posner, MD;  Location: [place];  Service: Vascular;  Laterality: Left;  . COLONOSCOPY N/A 06/17/2015   Procedure: COLONOSCOPY;  Surgeon: Gatha Mayer, MD;  Location: [place];  Service: Endoscopy;  Laterality: N/A;  . ESOPHAGOGASTRODUODENOSCOPY N/A 06/17/2015   Procedure: ESOPHAGOGASTRODUODENOSCOPY (EGD);  Surgeon: Gatha Mayer, MD;  Location: [facility] ENDOSCOPY;  Service: Endoscopy;  Laterality: N/A;  . FLEXIBLE SIGMOIDOSCOPY N/A 06/15/2015   Procedure: FLEXIBLE SIGMOIDOSCOPY;  Surgeon: Gatha Mayer, MD;  Location: [place];  Service: Endoscopy;  Laterality: N/A;  . GIVENS CAPSULE STUDY N/A 07/06/2015   Procedure: GIVENS CAPSULE STUDY;  Surgeon: Milus Banister, MD;  Location: [place];  Service: Endoscopy;  Laterality: N/A;  . JOINT REPLACEMENT     HIP  . rotator cuff repair right  4/08  . s/p left hip replacement  2007   Dr. Percell Miller ortho  .  TONSILLECTOMY      Current Outpatient Prescriptions  Medication Sig Dispense Refill  . allopurinol (ZYLOPRIM) 100 MG tablet TAKE 1 TABLET DAILY 90 tablet 3  . calcitRIOL (ROCALTROL) 0.5 MCG capsule Take 2 capsules (1 mcg total) by mouth every Monday, Wednesday, and Friday with hemodialysis. 25 capsule 0  . citalopram (CELEXA) 10 MG  tablet Take 1 tablet (10 mg total) by mouth daily. 90 tablet 3  . docusate sodium (COLACE) 100 MG capsule Take 2 capsules (200 mg total) by mouth 2 (two) times daily. 60 capsule 0  . doxercalciferol (HECTOROL) 0.5 MCG capsule Take 1.5 mcg by mouth 3 (three) times a week. MWF    . midodrine (PROAMATINE) 5 MG tablet Take 1 tablet (5 mg total) by mouth 3 (three) times daily. Take in the morning, lunch time and 4pm. Do not take after 4pm. 90 tablet 3  . Multiple Vitamin (MULTIVITAMIN WITH MINERALS) TABS tablet Take 1 tablet by mouth daily.    Marland Kitchen nystatin (MYCOSTATIN/NYSTOP) powder Use as directed twice per day 45 g 1  . rosuvastatin (CRESTOR) 20 MG tablet Take 1 tablet (20 mg total) by mouth at bedtime. 90 tablet 3   No current facility-administered medications for this visit.     Allergies as of 10/24/2016  . (No Known Allergies)    Family History  Problem Relation Age of Onset  . Hypertension Father   . Diabetes Father   . Cancer Father     prostate cancer  . Hypertension Brother   . Cancer Brother     Social History   Social History  . Marital status: Married    Spouse name: N/A  . Number of children: 2  . Years of education: N/A   Occupational History  . retired Print production planner    Social History Main Topics  . Smoking status: Former Smoker    Years: 5.00    Types: Cigarettes    Quit date: 09/17/1982  . Smokeless tobacco: Never Used  . Alcohol use No     Comment: rare  . Drug use: No  . Sexual activity: Not on file   Other Topics Concern  . Not on file   Social History Narrative  . No narrative on file    Review of Systems:    Constitutional: No weight loss, fever, chills, weakness or fatigue Cardiovascular: No chest pain Respiratory: No SOB  Gastrointestinal: See HPI and otherwise negative Neurological: No headache, dizziness or syncope Hematologic: No bleeding   Physical Exam:  Vital signs: BP (!) 122/58 (BP Location: Right Arm, Patient Position:  Sitting, Cuff Size: Normal)   Pulse 64   Ht 5' 8.5" (1.74 m)   Wt 148 lb 3.2 oz (67.2 kg)   BMI 22.21 kg/m   Constitutional:   Pleasant African-American male appears to be in NAD, Well developed, Well nourished, alert and cooperative Respiratory: Respirations even and unlabored. Lungs clear to auscultation bilaterally.   No wheezes, crackles, or rhonchi.  Cardiovascular: Normal S1, S2. No MRG. Regular rate and rhythm. No peripheral edema, cyanosis or pallor.  Gastrointestinal:  Soft, nondistended, nontender. No rebound or guarding. Normal bowel sounds. No appreciable masses or hepatomegaly. Psychiatric: Demonstrates good judgement and reason without abnormal affect or behaviors.  Most recent labs:  CBC    Component Value Date/Time   WBC 9.1 10/12/2016 1419   RBC 3.37 (L) 10/12/2016 1419   HGB 9.6 (L) 10/12/2016 1419   HCT 29.5 (L) 10/12/2016 1419   PLT 447.0 (H)  10/12/2016 1419   MCV 87.5 10/12/2016 1419   MCH 28.2 10/06/2016 0647   MCHC 32.6 10/12/2016 1419   RDW 21.7 (H) 10/12/2016 1419   LYMPHSABS 1.1 10/12/2016 1419   MONOABS 1.0 10/12/2016 1419   EOSABS 0.1 10/12/2016 1419   BASOSABS 0.0 10/12/2016 1419    CMP     Component Value Date/Time   NA 136 10/06/2016 0647   K 4.0 10/06/2016 0647   CL 95 (L) 10/06/2016 0647   CO2 29 10/06/2016 0647   GLUCOSE 93 10/06/2016 0647   BUN 36 (H) 10/06/2016 0647   CREATININE 7.32 (H) 10/06/2016 0647   CALCIUM 8.6 (L) 10/06/2016 0647   PROT 5.1 (L) 10/05/2016 0005   ALBUMIN 2.8 (L) 10/06/2016 0647   AST 14 (L) 10/05/2016 0005   ALT 9 (L) 10/05/2016 0005   ALKPHOS 44 10/05/2016 0005   BILITOT 0.2 (L) 10/05/2016 0005   GFRNONAA 6 (L) 10/06/2016 0647   GFRAA 7 (L) 10/06/2016 0647    Assessment: 1. Chronic IDA: Patient has had extensive workup in the past including EGD, colonoscopy, bleeding scan and Given's capsule study last year, was recently admitted for acute on chronic anemia, better after 2 units PRBCs during, no current  symptoms of GI bleed, doing well, hgb back up in the 9's, his baseline ; continue to believe anemia is related to chronic renal disease  Plan: 1. No change to current recommendations, discussed with patient that he should follow in our clinic in the future if he has any signs of acute GI bleeding including bright red blood per rectum, hematochezia, melena, syncope, dizziness or fatigue.  2. Patient to follow in clinic as needed in the future with Dr. [name] or myself  Ellouise Newer, PA-C [facility] 10/24/2016, 9:04 AM  Cc: [name], MD

## 2016-10-25 DIAGNOSIS — D509 Iron deficiency anemia, unspecified: Secondary | ICD-10-CM | POA: Diagnosis not present

## 2016-10-25 DIAGNOSIS — D631 Anemia in chronic kidney disease: Secondary | ICD-10-CM | POA: Diagnosis not present

## 2016-10-25 DIAGNOSIS — N2581 Secondary hyperparathyroidism of renal origin: Secondary | ICD-10-CM | POA: Diagnosis not present

## 2016-10-25 DIAGNOSIS — N186 End stage renal disease: Secondary | ICD-10-CM | POA: Diagnosis not present

## 2016-10-27 DIAGNOSIS — D509 Iron deficiency anemia, unspecified: Secondary | ICD-10-CM | POA: Diagnosis not present

## 2016-10-27 DIAGNOSIS — N2581 Secondary hyperparathyroidism of renal origin: Secondary | ICD-10-CM | POA: Diagnosis not present

## 2016-10-27 DIAGNOSIS — D631 Anemia in chronic kidney disease: Secondary | ICD-10-CM | POA: Diagnosis not present

## 2016-10-27 DIAGNOSIS — N186 End stage renal disease: Secondary | ICD-10-CM | POA: Diagnosis not present

## 2016-10-29 DIAGNOSIS — N2581 Secondary hyperparathyroidism of renal origin: Secondary | ICD-10-CM | POA: Diagnosis not present

## 2016-10-29 DIAGNOSIS — D631 Anemia in chronic kidney disease: Secondary | ICD-10-CM | POA: Diagnosis not present

## 2016-10-29 DIAGNOSIS — D509 Iron deficiency anemia, unspecified: Secondary | ICD-10-CM | POA: Diagnosis not present

## 2016-10-29 DIAGNOSIS — N186 End stage renal disease: Secondary | ICD-10-CM | POA: Diagnosis not present

## 2016-10-31 ENCOUNTER — Encounter (HOSPITAL_COMMUNITY)
Admission: RE | Admit: 2016-10-31 | Discharge: 2016-10-31 | Disposition: A | Discharge status: Home / Self Care | Payer: Medicare Other | Source: Ambulatory Visit | Attending: Urology | Admitting: Urology

## 2016-10-31 DIAGNOSIS — C61 Malignant neoplasm of prostate: Secondary | ICD-10-CM | POA: Insufficient documentation

## 2016-10-31 MED ORDER — TECHNETIUM TC 99M MEDRONATE IV KIT
20.6000 | PACK | Freq: Once | INTRAVENOUS | Status: AC | PRN
  Administered 2016-10-31: 20.6 via INTRAVENOUS

## 2016-11-01 DIAGNOSIS — N186 End stage renal disease: Secondary | ICD-10-CM | POA: Diagnosis not present

## 2016-11-01 DIAGNOSIS — N2581 Secondary hyperparathyroidism of renal origin: Secondary | ICD-10-CM | POA: Diagnosis not present

## 2016-11-01 DIAGNOSIS — D631 Anemia in chronic kidney disease: Secondary | ICD-10-CM | POA: Diagnosis not present

## 2016-11-01 DIAGNOSIS — D509 Iron deficiency anemia, unspecified: Secondary | ICD-10-CM | POA: Diagnosis not present

## 2016-11-01 NOTE — Progress Notes (Signed)
Agree with Ms. Lemmon's evaluation and management.  

## 2016-11-03 DIAGNOSIS — N186 End stage renal disease: Secondary | ICD-10-CM | POA: Diagnosis not present

## 2016-11-03 DIAGNOSIS — N2581 Secondary hyperparathyroidism of renal origin: Secondary | ICD-10-CM | POA: Diagnosis not present

## 2016-11-03 DIAGNOSIS — D509 Iron deficiency anemia, unspecified: Secondary | ICD-10-CM | POA: Diagnosis not present

## 2016-11-03 DIAGNOSIS — D631 Anemia in chronic kidney disease: Secondary | ICD-10-CM | POA: Diagnosis not present

## 2016-11-05 DIAGNOSIS — D631 Anemia in chronic kidney disease: Secondary | ICD-10-CM | POA: Diagnosis not present

## 2016-11-05 DIAGNOSIS — N186 End stage renal disease: Secondary | ICD-10-CM | POA: Diagnosis not present

## 2016-11-05 DIAGNOSIS — D509 Iron deficiency anemia, unspecified: Secondary | ICD-10-CM | POA: Diagnosis not present

## 2016-11-05 DIAGNOSIS — Z992 Dependence on renal dialysis: Secondary | ICD-10-CM | POA: Diagnosis not present

## 2016-11-05 DIAGNOSIS — I120 Hypertensive chronic kidney disease with stage 5 chronic kidney disease or end stage renal disease: Secondary | ICD-10-CM | POA: Diagnosis not present

## 2016-11-05 DIAGNOSIS — N2581 Secondary hyperparathyroidism of renal origin: Secondary | ICD-10-CM | POA: Diagnosis not present

## 2016-11-08 DIAGNOSIS — D509 Iron deficiency anemia, unspecified: Secondary | ICD-10-CM | POA: Diagnosis not present

## 2016-11-08 DIAGNOSIS — D631 Anemia in chronic kidney disease: Secondary | ICD-10-CM | POA: Diagnosis not present

## 2016-11-08 DIAGNOSIS — N2581 Secondary hyperparathyroidism of renal origin: Secondary | ICD-10-CM | POA: Diagnosis not present

## 2016-11-08 DIAGNOSIS — N186 End stage renal disease: Secondary | ICD-10-CM | POA: Diagnosis not present

## 2016-11-10 DIAGNOSIS — D509 Iron deficiency anemia, unspecified: Secondary | ICD-10-CM | POA: Diagnosis not present

## 2016-11-10 DIAGNOSIS — N186 End stage renal disease: Secondary | ICD-10-CM | POA: Diagnosis not present

## 2016-11-10 DIAGNOSIS — N2581 Secondary hyperparathyroidism of renal origin: Secondary | ICD-10-CM | POA: Diagnosis not present

## 2016-11-10 DIAGNOSIS — D631 Anemia in chronic kidney disease: Secondary | ICD-10-CM | POA: Diagnosis not present

## 2016-11-13 DIAGNOSIS — N186 End stage renal disease: Secondary | ICD-10-CM | POA: Diagnosis not present

## 2016-11-13 DIAGNOSIS — N2581 Secondary hyperparathyroidism of renal origin: Secondary | ICD-10-CM | POA: Diagnosis not present

## 2016-11-13 DIAGNOSIS — D631 Anemia in chronic kidney disease: Secondary | ICD-10-CM | POA: Diagnosis not present

## 2016-11-13 DIAGNOSIS — D509 Iron deficiency anemia, unspecified: Secondary | ICD-10-CM | POA: Diagnosis not present

## 2016-11-15 DIAGNOSIS — D631 Anemia in chronic kidney disease: Secondary | ICD-10-CM | POA: Diagnosis not present

## 2016-11-15 DIAGNOSIS — N186 End stage renal disease: Secondary | ICD-10-CM | POA: Diagnosis not present

## 2016-11-15 DIAGNOSIS — N2581 Secondary hyperparathyroidism of renal origin: Secondary | ICD-10-CM | POA: Diagnosis not present

## 2016-11-15 DIAGNOSIS — D509 Iron deficiency anemia, unspecified: Secondary | ICD-10-CM | POA: Diagnosis not present

## 2016-11-17 DIAGNOSIS — D509 Iron deficiency anemia, unspecified: Secondary | ICD-10-CM | POA: Diagnosis not present

## 2016-11-17 DIAGNOSIS — N186 End stage renal disease: Secondary | ICD-10-CM | POA: Diagnosis not present

## 2016-11-17 DIAGNOSIS — D631 Anemia in chronic kidney disease: Secondary | ICD-10-CM | POA: Diagnosis not present

## 2016-11-17 DIAGNOSIS — N2581 Secondary hyperparathyroidism of renal origin: Secondary | ICD-10-CM | POA: Diagnosis not present

## 2016-11-20 DIAGNOSIS — N186 End stage renal disease: Secondary | ICD-10-CM | POA: Diagnosis not present

## 2016-11-20 DIAGNOSIS — D631 Anemia in chronic kidney disease: Secondary | ICD-10-CM | POA: Diagnosis not present

## 2016-11-20 DIAGNOSIS — N2581 Secondary hyperparathyroidism of renal origin: Secondary | ICD-10-CM | POA: Diagnosis not present

## 2016-11-20 DIAGNOSIS — D509 Iron deficiency anemia, unspecified: Secondary | ICD-10-CM | POA: Diagnosis not present

## 2016-11-22 DIAGNOSIS — N186 End stage renal disease: Secondary | ICD-10-CM | POA: Diagnosis not present

## 2016-11-22 DIAGNOSIS — N2581 Secondary hyperparathyroidism of renal origin: Secondary | ICD-10-CM | POA: Diagnosis not present

## 2016-11-22 DIAGNOSIS — D631 Anemia in chronic kidney disease: Secondary | ICD-10-CM | POA: Diagnosis not present

## 2016-11-22 DIAGNOSIS — D509 Iron deficiency anemia, unspecified: Secondary | ICD-10-CM | POA: Diagnosis not present

## 2016-11-24 DIAGNOSIS — D509 Iron deficiency anemia, unspecified: Secondary | ICD-10-CM | POA: Diagnosis not present

## 2016-11-24 DIAGNOSIS — N186 End stage renal disease: Secondary | ICD-10-CM | POA: Diagnosis not present

## 2016-11-24 DIAGNOSIS — D631 Anemia in chronic kidney disease: Secondary | ICD-10-CM | POA: Diagnosis not present

## 2016-11-24 DIAGNOSIS — N2581 Secondary hyperparathyroidism of renal origin: Secondary | ICD-10-CM | POA: Diagnosis not present

## 2016-11-27 DIAGNOSIS — N2581 Secondary hyperparathyroidism of renal origin: Secondary | ICD-10-CM | POA: Diagnosis not present

## 2016-11-27 DIAGNOSIS — D509 Iron deficiency anemia, unspecified: Secondary | ICD-10-CM | POA: Diagnosis not present

## 2016-11-27 DIAGNOSIS — N186 End stage renal disease: Secondary | ICD-10-CM | POA: Diagnosis not present

## 2016-11-27 DIAGNOSIS — D631 Anemia in chronic kidney disease: Secondary | ICD-10-CM | POA: Diagnosis not present

## 2016-11-29 DIAGNOSIS — D631 Anemia in chronic kidney disease: Secondary | ICD-10-CM | POA: Diagnosis not present

## 2016-11-29 DIAGNOSIS — N2581 Secondary hyperparathyroidism of renal origin: Secondary | ICD-10-CM | POA: Diagnosis not present

## 2016-11-29 DIAGNOSIS — D509 Iron deficiency anemia, unspecified: Secondary | ICD-10-CM | POA: Diagnosis not present

## 2016-11-29 DIAGNOSIS — N186 End stage renal disease: Secondary | ICD-10-CM | POA: Diagnosis not present

## 2016-12-01 DIAGNOSIS — N2581 Secondary hyperparathyroidism of renal origin: Secondary | ICD-10-CM | POA: Diagnosis not present

## 2016-12-01 DIAGNOSIS — N186 End stage renal disease: Secondary | ICD-10-CM | POA: Diagnosis not present

## 2016-12-01 DIAGNOSIS — D509 Iron deficiency anemia, unspecified: Secondary | ICD-10-CM | POA: Diagnosis not present

## 2016-12-01 DIAGNOSIS — D631 Anemia in chronic kidney disease: Secondary | ICD-10-CM | POA: Diagnosis not present

## 2016-12-04 DIAGNOSIS — D509 Iron deficiency anemia, unspecified: Secondary | ICD-10-CM | POA: Diagnosis not present

## 2016-12-04 DIAGNOSIS — D631 Anemia in chronic kidney disease: Secondary | ICD-10-CM | POA: Diagnosis not present

## 2016-12-04 DIAGNOSIS — N186 End stage renal disease: Secondary | ICD-10-CM | POA: Diagnosis not present

## 2016-12-04 DIAGNOSIS — N2581 Secondary hyperparathyroidism of renal origin: Secondary | ICD-10-CM | POA: Diagnosis not present

## 2016-12-06 DIAGNOSIS — N186 End stage renal disease: Secondary | ICD-10-CM | POA: Diagnosis not present

## 2016-12-06 DIAGNOSIS — D631 Anemia in chronic kidney disease: Secondary | ICD-10-CM | POA: Diagnosis not present

## 2016-12-06 DIAGNOSIS — Z992 Dependence on renal dialysis: Secondary | ICD-10-CM | POA: Diagnosis not present

## 2016-12-06 DIAGNOSIS — I120 Hypertensive chronic kidney disease with stage 5 chronic kidney disease or end stage renal disease: Secondary | ICD-10-CM | POA: Diagnosis not present

## 2016-12-06 DIAGNOSIS — D509 Iron deficiency anemia, unspecified: Secondary | ICD-10-CM | POA: Diagnosis not present

## 2016-12-06 DIAGNOSIS — N2581 Secondary hyperparathyroidism of renal origin: Secondary | ICD-10-CM | POA: Diagnosis not present

## 2016-12-08 ENCOUNTER — Encounter (HOSPITAL_COMMUNITY): Payer: Self-pay

## 2016-12-08 ENCOUNTER — Emergency Department (HOSPITAL_COMMUNITY): Payer: Medicare Other

## 2016-12-08 ENCOUNTER — Observation Stay (HOSPITAL_COMMUNITY)
Admission: EM | Admit: 2016-12-08 | Discharge: 2016-12-09 | Disposition: A | Discharge status: Home / Self Care | Payer: Medicare Other | Attending: Nephrology | Admitting: Nephrology

## 2016-12-08 DIAGNOSIS — R778 Other specified abnormalities of plasma proteins: Secondary | ICD-10-CM | POA: Diagnosis present

## 2016-12-08 DIAGNOSIS — J101 Influenza due to other identified influenza virus with other respiratory manifestations: Secondary | ICD-10-CM | POA: Diagnosis not present

## 2016-12-08 DIAGNOSIS — I120 Hypertensive chronic kidney disease with stage 5 chronic kidney disease or end stage renal disease: Secondary | ICD-10-CM | POA: Diagnosis not present

## 2016-12-08 DIAGNOSIS — Z8673 Personal history of transient ischemic attack (TIA), and cerebral infarction without residual deficits: Secondary | ICD-10-CM | POA: Insufficient documentation

## 2016-12-08 DIAGNOSIS — D631 Anemia in chronic kidney disease: Secondary | ICD-10-CM | POA: Diagnosis present

## 2016-12-08 DIAGNOSIS — G9340 Encephalopathy, unspecified: Secondary | ICD-10-CM | POA: Diagnosis not present

## 2016-12-08 DIAGNOSIS — Z79899 Other long term (current) drug therapy: Secondary | ICD-10-CM | POA: Insufficient documentation

## 2016-12-08 DIAGNOSIS — E785 Hyperlipidemia, unspecified: Secondary | ICD-10-CM | POA: Diagnosis present

## 2016-12-08 DIAGNOSIS — Z87891 Personal history of nicotine dependence: Secondary | ICD-10-CM | POA: Diagnosis not present

## 2016-12-08 DIAGNOSIS — I10 Essential (primary) hypertension: Secondary | ICD-10-CM | POA: Diagnosis present

## 2016-12-08 DIAGNOSIS — Z96642 Presence of left artificial hip joint: Secondary | ICD-10-CM | POA: Diagnosis not present

## 2016-12-08 DIAGNOSIS — J111 Influenza due to unidentified influenza virus with other respiratory manifestations: Secondary | ICD-10-CM

## 2016-12-08 DIAGNOSIS — Z992 Dependence on renal dialysis: Secondary | ICD-10-CM | POA: Diagnosis not present

## 2016-12-08 DIAGNOSIS — D509 Iron deficiency anemia, unspecified: Secondary | ICD-10-CM | POA: Diagnosis present

## 2016-12-08 DIAGNOSIS — D638 Anemia in other chronic diseases classified elsewhere: Secondary | ICD-10-CM | POA: Diagnosis present

## 2016-12-08 DIAGNOSIS — N186 End stage renal disease: Secondary | ICD-10-CM | POA: Insufficient documentation

## 2016-12-08 DIAGNOSIS — R509 Fever, unspecified: Secondary | ICD-10-CM

## 2016-12-08 DIAGNOSIS — R7989 Other specified abnormal findings of blood chemistry: Secondary | ICD-10-CM

## 2016-12-08 DIAGNOSIS — Z8546 Personal history of malignant neoplasm of prostate: Secondary | ICD-10-CM | POA: Insufficient documentation

## 2016-12-08 DIAGNOSIS — R748 Abnormal levels of other serum enzymes: Secondary | ICD-10-CM | POA: Diagnosis not present

## 2016-12-08 DIAGNOSIS — I110 Hypertensive heart disease with heart failure: Secondary | ICD-10-CM | POA: Diagnosis not present

## 2016-12-08 DIAGNOSIS — J09X2 Influenza due to identified novel influenza A virus with other respiratory manifestations: Principal | ICD-10-CM | POA: Insufficient documentation

## 2016-12-08 DIAGNOSIS — R05 Cough: Secondary | ICD-10-CM | POA: Diagnosis not present

## 2016-12-08 DIAGNOSIS — N189 Chronic kidney disease, unspecified: Secondary | ICD-10-CM

## 2016-12-08 HISTORY — DX: Influenza due to unidentified influenza virus with other respiratory manifestations: J11.1

## 2016-12-08 LAB — COMPREHENSIVE METABOLIC PANEL
ALBUMIN: 3.6 g/dL (ref 3.5–5.0)
ALT: 13 U/L — AB (ref 17–63)
AST: 22 U/L (ref 15–41)
Alkaline Phosphatase: 50 U/L (ref 38–126)
Anion gap: 17 — ABNORMAL HIGH (ref 5–15)
BILIRUBIN TOTAL: 0.6 mg/dL (ref 0.3–1.2)
BUN: 48 mg/dL — AB (ref 6–20)
CO2: 28 mmol/L (ref 22–32)
CREATININE: 10.7 mg/dL — AB (ref 0.61–1.24)
Calcium: 9.1 mg/dL (ref 8.9–10.3)
Chloride: 93 mmol/L — ABNORMAL LOW (ref 101–111)
GFR calc Af Amer: 4 mL/min — ABNORMAL LOW (ref >60)
GFR calc non Af Amer: 4 mL/min — ABNORMAL LOW (ref >60)
GLUCOSE: 103 mg/dL — AB (ref 65–99)
POTASSIUM: 4.6 mmol/L (ref 3.5–5.1)
Sodium: 138 mmol/L (ref 135–145)
TOTAL PROTEIN: 6.5 g/dL (ref 6.5–8.1)

## 2016-12-08 LAB — RENAL FUNCTION PANEL
Albumin: 3.1 g/dL — ABNORMAL LOW (ref 3.5–5.0)
Anion gap: 14 (ref 5–15)
BUN: 44 mg/dL — AB (ref 6–20)
CALCIUM: 8.7 mg/dL — AB (ref 8.9–10.3)
CO2: 27 mmol/L (ref 22–32)
CREATININE: 9.44 mg/dL — AB (ref 0.61–1.24)
Chloride: 96 mmol/L — ABNORMAL LOW (ref 101–111)
GFR, EST AFRICAN AMERICAN: 5 mL/min — AB (ref >60)
GFR, EST NON AFRICAN AMERICAN: 4 mL/min — AB (ref >60)
Glucose, Bld: 110 mg/dL — ABNORMAL HIGH (ref 65–99)
Phosphorus: 4.9 mg/dL — ABNORMAL HIGH (ref 2.5–4.6)
Potassium: 3.8 mmol/L (ref 3.5–5.1)
SODIUM: 137 mmol/L (ref 135–145)

## 2016-12-08 LAB — HEPATITIS B SURFACE ANTIGEN: HEP B S AG: NEGATIVE

## 2016-12-08 LAB — CBC
HCT: 38.2 % — ABNORMAL LOW (ref 39.0–52.0)
Hemoglobin: 12 g/dL — ABNORMAL LOW (ref 13.0–17.0)
MCH: 28.4 pg (ref 26.0–34.0)
MCHC: 31.4 g/dL (ref 30.0–36.0)
MCV: 90.3 fL (ref 78.0–100.0)
PLATELETS: 149 10*3/uL — AB (ref 150–400)
RBC: 4.23 MIL/uL (ref 4.22–5.81)
RDW: 19 % — AB (ref 11.5–15.5)
WBC: 4.4 10*3/uL (ref 4.0–10.5)

## 2016-12-08 LAB — CBC WITH DIFFERENTIAL/PLATELET
BASOS ABS: 0 10*3/uL (ref 0.0–0.1)
BASOS PCT: 0 %
Eosinophils Absolute: 0.1 10*3/uL (ref 0.0–0.7)
Eosinophils Relative: 1 %
HEMATOCRIT: 40.7 % (ref 39.0–52.0)
HEMOGLOBIN: 12.8 g/dL — AB (ref 13.0–17.0)
LYMPHS PCT: 9 %
Lymphs Abs: 0.5 10*3/uL — ABNORMAL LOW (ref 0.7–4.0)
MCH: 28.6 pg (ref 26.0–34.0)
MCHC: 31.4 g/dL (ref 30.0–36.0)
MCV: 91.1 fL (ref 78.0–100.0)
MONO ABS: 1.2 10*3/uL — AB (ref 0.1–1.0)
Monocytes Relative: 21 %
NEUTROS ABS: 3.9 10*3/uL (ref 1.7–7.7)
NEUTROS PCT: 69 %
Platelets: 160 10*3/uL (ref 150–400)
RBC: 4.47 MIL/uL (ref 4.22–5.81)
RDW: 18.8 % — AB (ref 11.5–15.5)
WBC: 5.6 10*3/uL (ref 4.0–10.5)

## 2016-12-08 LAB — TROPONIN I
Troponin I: 0.15 ng/mL (ref <0.03)
Troponin I: 0.18 ng/mL (ref <0.03)
Troponin I: 0.16 ng/mL (ref <0.03)

## 2016-12-08 LAB — I-STAT CG4 LACTIC ACID, ED
LACTIC ACID, VENOUS: 0.66 mmol/L (ref 0.5–1.9)
Lactic Acid, Venous: 0.89 mmol/L (ref 0.5–1.9)

## 2016-12-08 LAB — INFLUENZA PANEL BY PCR (TYPE A & B)
INFLBPCR: NEGATIVE
Influenza A By PCR: POSITIVE — AB

## 2016-12-08 MED ORDER — DOCUSATE SODIUM 100 MG PO CAPS
200.0000 mg | ORAL_CAPSULE | Freq: Two times a day (BID) | ORAL | Status: DC
  Administered 2016-12-08 – 2016-12-09 (×2): 200 mg via ORAL
  Filled 2016-12-08 (×2): qty 2

## 2016-12-08 MED ORDER — LIDOCAINE HCL (PF) 1 % IJ SOLN: 5.0000 mL | INTRAMUSCULAR | Status: DC | PRN

## 2016-12-08 MED ORDER — ONDANSETRON HCL 4 MG/2ML IJ SOLN: 4.0000 mg | Freq: Four times a day (QID) | INTRAMUSCULAR | Status: DC | PRN

## 2016-12-08 MED ORDER — CALCITRIOL 0.5 MCG PO CAPS: 1.0000 ug | ORAL_CAPSULE | ORAL | Status: DC

## 2016-12-08 MED ORDER — LIDOCAINE-PRILOCAINE 2.5-2.5 % EX CREA: 1.0000 "application " | TOPICAL_CREAM | CUTANEOUS | Status: DC | PRN

## 2016-12-08 MED ORDER — PENTAFLUOROPROP-TETRAFLUOROETH EX AERO: 1.0000 "application " | INHALATION_SPRAY | CUTANEOUS | Status: DC | PRN

## 2016-12-08 MED ORDER — HEPARIN SODIUM (PORCINE) 5000 UNIT/ML IJ SOLN
5000.0000 [IU] | Freq: Three times a day (TID) | INTRAMUSCULAR | Status: DC
  Administered 2016-12-08 – 2016-12-09 (×3): 5000 [IU] via SUBCUTANEOUS
  Filled 2016-12-08 (×2): qty 1

## 2016-12-08 MED ORDER — ALLOPURINOL 100 MG PO TABS
100.0000 mg | ORAL_TABLET | Freq: Every day | ORAL | Status: DC
  Administered 2016-12-08 – 2016-12-09 (×2): 100 mg via ORAL
  Filled 2016-12-08 (×2): qty 1

## 2016-12-08 MED ORDER — OSELTAMIVIR PHOSPHATE 30 MG PO CAPS
30.0000 mg | ORAL_CAPSULE | Freq: Once | ORAL | Status: AC
  Administered 2016-12-08: 30 mg via ORAL
  Filled 2016-12-08: qty 1

## 2016-12-08 MED ORDER — ADULT MULTIVITAMIN W/MINERALS CH
1.0000 | ORAL_TABLET | Freq: Every day | ORAL | Status: DC
  Administered 2016-12-08 – 2016-12-09 (×2): 1 via ORAL
  Filled 2016-12-08 (×2): qty 1

## 2016-12-08 MED ORDER — ROSUVASTATIN CALCIUM 20 MG PO TABS
20.0000 mg | ORAL_TABLET | Freq: Every day | ORAL | Status: DC
  Administered 2016-12-08: 20 mg via ORAL
  Filled 2016-12-08: qty 1

## 2016-12-08 MED ORDER — ACETAMINOPHEN 325 MG PO TABS
650.0000 mg | ORAL_TABLET | Freq: Once | ORAL | Status: AC
  Administered 2016-12-08: 650 mg via ORAL
  Filled 2016-12-08: qty 2

## 2016-12-08 MED ORDER — ACETAMINOPHEN 325 MG PO TABS: 650.0000 mg | ORAL_TABLET | Freq: Four times a day (QID) | ORAL | Status: DC | PRN

## 2016-12-08 MED ORDER — DOXERCALCIFEROL 0.5 MCG PO CAPS: 1.5000 ug | ORAL_CAPSULE | ORAL | Status: DC

## 2016-12-08 MED ORDER — SODIUM CHLORIDE 0.9% FLUSH
3.0000 mL | Freq: Two times a day (BID) | INTRAVENOUS | Status: DC
  Administered 2016-12-08 – 2016-12-09 (×3): 3 mL via INTRAVENOUS

## 2016-12-08 MED ORDER — CALCITRIOL 0.5 MCG PO CAPS
ORAL_CAPSULE | ORAL | Status: AC
  Filled 2016-12-08: qty 3

## 2016-12-08 MED ORDER — SODIUM CHLORIDE 0.9 % IV SOLN: 100.0000 mL | INTRAVENOUS | Status: DC | PRN

## 2016-12-08 MED ORDER — HEPARIN SODIUM (PORCINE) 1000 UNIT/ML DIALYSIS: 1000.0000 [IU] | INTRAMUSCULAR | Status: DC | PRN

## 2016-12-08 MED ORDER — ALTEPLASE 2 MG IJ SOLR: 2.0000 mg | Freq: Once | INTRAMUSCULAR | Status: DC | PRN

## 2016-12-08 MED ORDER — OSELTAMIVIR PHOSPHATE 30 MG PO CAPS
30.0000 mg | ORAL_CAPSULE | ORAL | Status: DC
  Administered 2016-12-08: 30 mg via ORAL
  Filled 2016-12-08: qty 1

## 2016-12-08 MED ORDER — ACETAMINOPHEN 650 MG RE SUPP: 650.0000 mg | Freq: Four times a day (QID) | RECTAL | Status: DC | PRN

## 2016-12-08 MED ORDER — CITALOPRAM HYDROBROMIDE 20 MG PO TABS
10.0000 mg | ORAL_TABLET | Freq: Every day | ORAL | Status: DC
  Administered 2016-12-08 – 2016-12-09 (×2): 10 mg via ORAL
  Filled 2016-12-08 (×2): qty 1

## 2016-12-08 MED ORDER — OSELTAMIVIR PHOSPHATE 30 MG PO CAPS: 30.0000 mg | ORAL_CAPSULE | Freq: Two times a day (BID) | ORAL | Status: DC

## 2016-12-08 MED ORDER — HEPARIN SODIUM (PORCINE) 1000 UNIT/ML DIALYSIS: 2000.0000 [IU] | INTRAMUSCULAR | Status: DC | PRN

## 2016-12-08 MED ORDER — MIDODRINE HCL 5 MG PO TABS: 5.0000 mg | ORAL_TABLET | Freq: Three times a day (TID) | ORAL | Status: DC | PRN

## 2016-12-08 MED ORDER — CALCITRIOL 0.5 MCG PO CAPS
1.5000 ug | ORAL_CAPSULE | ORAL | Status: DC
  Administered 2016-12-08: 1.5 ug via ORAL

## 2016-12-08 NOTE — ED Notes (Signed)
ED Provider at bedside. 

## 2016-12-08 NOTE — ED Triage Notes (Addendum)
Pt presents via EMS with reported AMS and fever per pt's wife. Pt's wife reports pt woke up at 0115 this AM and had about a 5 minute episode of confusion. Per pt's wife, pt had a 102 degree fever this AM with non-productive cough. Pt A&O x4 at this time, reports he remembers speaking to his wife this Am. Pt with L arm fistula due to dialysis. 148/77, HR 93, RR 16, 96% RA. CBG 129. Denies N/V, SOB, or dizziness. Pt Mon/Weds/Fri dialysis.

## 2016-12-08 NOTE — ED Provider Notes (Signed)
[name]  CSN: [id number] Arrival date & time: 12/08/16  0241 By signing my name below, I, Dyke Brackett, attest that this documentation has been prepared under the direction and in the presence of Ezequiel Essex, MD . Electronically Signed: Dyke Brackett, Scribe. 12/08/2016. 2:53 AM.   History   Chief Complaint Chief Complaint  Patient presents with  . Altered Mental Status  . Fever    HPI [name] is a 83 y.o. male with a PMHx of ESRD on dialysis, HTN, stroke, and sepsis who presents to the Emergency Department complaining of fever (tmax 102) which began this morning at 1:15. Pt's wife reports that he woke up confused and was not acting like himself. She then checked his temperature and reports a fever of 102 at home. She notes associated intermittent, nonproductive cough for a few days. No alleviating or modifying factors noted.  No prior treatments indicated. Pt is a MWF dialysis patient and states he has never missed a session. He did not receive a flu shot this year. No hx of MI. Pt denies vomiting, fever, abdominal pain, sore throat, headache, rhinorrhea, myalgias, CP, SOB, urinary symptoms, decreased appetite, or any other associated symptoms.   The history is provided by the patient. No language interpreter was used.   Past Medical History:  Diagnosis Date  . ANEMIA-IRON DEFICIENCY 03/09/2008  . Arthritis    cervical spine.   . Complex renal cyst 06/19/2011  . Depression 03/26/2014  . DIVERTICULOSIS, COLON 03/09/2008  . ESRD (end stage renal disease) ([place]) 03/09/2008  . GOUT 03/09/2008  . Hemorrhoids 08/2009   internal.   . History of blood transfusion   . HYPERLIPIDEMIA 03/09/2008  . Hyperparathyroidism ([place])   . HYPERTENSION 03/09/2008  . Prostate cancer [facility]) 2002   Completed external beam radiation 2003.per HPI  . PROSTATE CANCER, HX OF 03/09/2008  . Radiation cystitis 2010.  Marland Kitchen Stroke [facility])     Patient Active Problem List   Diagnosis Date Noted    . Symptomatic anemia 10/04/2016  . Orthostatic hypotension 09/07/2016  . Rash 07/09/2016  . Fever in adult 12/06/2015  . Sepsis, unspecified organism ([place]) 12/06/2015  . Sepsis ([place]) 12/06/2015  . ESRD on dialysis ([place]) 09/09/2015  . Cerebrovascular accident (CVA) due to thrombosis of right middle cerebral artery ([place]) 09/09/2015  . Bleeding gastrointestinal   . History of GI bleed   . HLD (hyperlipidemia)   . Anemia, blood loss   . Benign neoplasm of descending colon   . Gastritis   . Syncope 06/15/2015  . Acute GI bleeding 06/15/2015  . Acute blood loss anemia 06/15/2015  . Diverticulosis of colon with hemorrhage   . Pulse irregularity 05/06/2015  . Lower back pain 03/26/2014  . Depression 03/26/2014  . Hematochezia 03/14/2013  . Dizziness 07/27/2012  . Complex renal cyst 06/19/2011  . Left arm weakness 06/19/2011  . Preventative health care 06/17/2011  . SHOULDER PAIN, LEFT 06/03/2010  . Gout 03/09/2008  . Iron deficiency anemia 03/09/2008  . Essential hypertension 03/09/2008  . Diverticulosis of colon 03/09/2008  . FATIGUE 03/09/2008  . PROSTATE CANCER, HX OF 03/09/2008    Past Surgical History:  Procedure Laterality Date  . AV FISTULA PLACEMENT Left 01/24/2013   Procedure: INSERTION OF ARTERIOVENOUS (AV) GORE-TEX GRAFT ARM;  Surgeon: Rosetta Posner, MD;  Location: [place];  Service: Vascular;  Laterality: Left;  . COLONOSCOPY N/A 06/17/2015   Procedure: COLONOSCOPY;  Surgeon: Gatha Mayer, MD;  Location: [place];  Service: Endoscopy;  Laterality: N/A;  . ESOPHAGOGASTRODUODENOSCOPY N/A 06/17/2015   Procedure: ESOPHAGOGASTRODUODENOSCOPY (EGD);  Surgeon: Gatha Mayer, MD;  Location[facility] ENDOSCOPY;  Service: Endoscopy;  Laterality: N/A;  . FLEXIBLE SIGMOIDOSCOPY N/A 06/15/2015   Procedure: FLEXIBLE SIGMOIDOSCOPY;  Surgeon: Gatha Mayer, MD;  Location: [place];  Service: Endoscopy;  Laterality: N/A;  . GIVENS CAPSULE STUDY N/A 07/06/2015   Procedure: GIVENS  CAPSULE STUDY;  Surgeon: Milus Banister, MD;  Location: [place];  Service: Endoscopy;  Laterality: N/A;  . JOINT REPLACEMENT     HIP  . rotator cuff repair right  4/08  . s/p left hip replacement  2007   Dr. Percell Miller ortho  . TONSILLECTOMY       Home Medications    Prior to Admission medications   Medication Sig Start Date End Date Taking? Authorizing Provider  allopurinol (ZYLOPRIM) 100 MG tablet TAKE 1 TABLET DAILY 08/07/16   [name], MD  calcitRIOL (ROCALTROL) 0.5 MCG capsule Take 2 capsules (1 mcg total) by mouth every Monday, Wednesday, and Friday with hemodialysis. 10/06/16   Doreatha Lew, MD  citalopram (CELEXA) 10 MG tablet Take 1 tablet (10 mg total) by mouth daily. 08/07/16   [name], MD  docusate sodium (COLACE) 100 MG capsule Take 2 capsules (200 mg total) by mouth 2 (two) times daily. 06/22/15   Thurnell Lose, MD  doxercalciferol (HECTOROL) 0.5 MCG capsule Take 1.5 mcg by mouth 3 (three) times a week. MWF    Historical Provider, MD  midodrine (PROAMATINE) 5 MG tablet Take 1 tablet (5 mg total) by mouth 3 (three) times daily. Take in the morning, lunch time and 4pm. Do not take after 4pm. 09/07/16   Rosalin Hawking, MD  Multiple Vitamin (MULTIVITAMIN WITH MINERALS) TABS tablet Take 1 tablet by mouth daily.    Historical Provider, MD  nystatin (MYCOSTATIN/NYSTOP) powder Use as directed twice per day 07/06/16   [name], MD  rosuvastatin (CRESTOR) 20 MG tablet Take 1 tablet (20 mg total) by mouth at bedtime. 01/17/16   [name], MD    Family History Family History  Problem Relation Age of Onset  . Hypertension Father   . Diabetes Father   . Cancer Father     prostate cancer  . Hypertension Brother   . Cancer Brother     Social History Social History  Substance Use Topics  . Smoking status: Former Smoker    Years: 5.00    Types: Cigarettes    Quit date: 09/17/1982  . Smokeless tobacco: Never Used  . Alcohol use No     Comment: rare      Allergies   Patient has no known allergies.   Review of Systems Review of Systems 10 systems reviewed and all are negative for acute change except as noted in the HPI.  Physical Exam Updated Vital Signs There were no vitals taken for this visit.  Physical Exam  Constitutional: He is oriented to person, place, and time. He appears well-developed and well-nourished. No distress.  Febrile  HENT:  Head: Normocephalic and atraumatic.  Mouth/Throat: Oropharynx is clear and moist. No oropharyngeal exudate.  Eyes: Conjunctivae and EOM are normal. Pupils are equal, round, and reactive to light.  Neck: Normal range of motion. Neck supple.  No meningismus.  Cardiovascular: Normal rate, regular rhythm and intact distal pulses.   Murmur heard. Pulmonary/Chest: Effort normal. No respiratory distress.  Decreased breath sounds at the bases  Abdominal: Soft. There  is no tenderness. There is no rebound and no guarding.  Musculoskeletal: Normal range of motion. He exhibits no edema or tenderness.  Left forearm fistula with frill.   Neurological: He is alert and oriented to person, place, and time. No cranial nerve deficit. He exhibits normal muscle tone. Coordination normal.   5/5 strength throughout. CN 2-12 intact.Equal grip strength.   Skin: Skin is warm.  Psychiatric: He has a normal mood and affect. His behavior is normal.  Nursing note and vitals reviewed.  ED Treatments / Results  DIAGNOSTIC STUDIES:  Oxygen Saturation is 98% on RA, normal by my interpretation.    COORDINATION OF CARE:  3:04 AM Discussed treatment plan with pt at bedside and pt agreed to plan.   Labs (all labs ordered are listed, but only abnormal results are displayed) Labs Reviewed  CBC WITH DIFFERENTIAL/PLATELET - Abnormal; Notable for the following:       Result Value   Hemoglobin 12.8 (*)    RDW 18.8 (*)    Lymphs Abs 0.5 (*)    Monocytes Absolute 1.2 (*)    All other components within normal  limits  COMPREHENSIVE METABOLIC PANEL - Abnormal; Notable for the following:    Chloride 93 (*)    Glucose, Bld 103 (*)    BUN 48 (*)    Creatinine, Ser 10.70 (*)    ALT 13 (*)    GFR calc non Af Amer 4 (*)    GFR calc Af Amer 4 (*)    Anion gap 17 (*)    All other components within normal limits  TROPONIN I - Abnormal; Notable for the following:    Troponin I 0.15 (*)    All other components within normal limits  INFLUENZA PANEL BY PCR (TYPE A & B) - Abnormal; Notable for the following:    Influenza A By PCR POSITIVE (*)    All other components within normal limits  CULTURE, BLOOD (ROUTINE X 2)  CULTURE, BLOOD (ROUTINE X 2)  TROPONIN I  I-STAT CG4 LACTIC ACID, ED  I-STAT CG4 LACTIC ACID, ED    EKG  EKG Interpretation  Date/Time:  Friday December 08 2016 02:48:20 EST Ventricular Rate:  94 PR Interval:    QRS Duration: 96 QT Interval:  371 QTC Calculation: 464 R Axis:   -60 Text Interpretation:  Sinus rhythm Probable left atrial enlargement Left anterior fascicular block Borderline ST elevation, anterior leads No significant change was found Confirmed by Wyvonnia Dusky  MD, Karver Fadden [phone]) on 12/08/2016 2:51:28 AM       Radiology Dg Chest 2 View  Result Date: 12/08/2016 CLINICAL DATA:  Fever and altered mental status at 01:15 this morning. Nonproductive cough appear EXAM: CHEST  2 VIEW COMPARISON:  12/06/2015 FINDINGS: The lungs are clear. The pulmonary vasculature is normal. No pleural effusions. Hilar, mediastinal and cardiac contours are unremarkable and unchanged. Marked thoracic scoliosis. IMPRESSION: No active cardiopulmonary disease. Electronically Signed   By: Andreas Newport M.D.   On: 12/08/2016 03:31    Procedures Procedures (including critical care time)  Medications Ordered in ED Medications - No data to display   Initial Impression / Assessment and Plan / ED Course  I have reviewed the triage vital signs and the nursing notes.  Pertinent labs & imaging  results that were available during my care of the patient were reviewed by me and considered in my medical decision making (see chart for details).     Patient from home with confusion and fever. Confusion  has since resolved. Patient is a nonproductive cough and fever up to 102. Is a dialysis patient due for dialysis this morning. Denies nausea or vomiting. Denies chest pain or shortness of breath. Denies abdominal pain.  Patient well-appearing. He is alert and oriented 3. He denies complaint. EKG shows abnormal elevation in V2 similar to previous. Denies chest pain.  No leukocytosis. Lactate normal. Chest x-ray without pneumonia. Patient unable to give urine sample. Minimal troponin elevation similar previous.  Flu swab positive. tamiflu started.  At baseline now per wife, patient without complaints. Due for dialysis today.  D/w Dr. Augustin Coupe of nephrology who does not feel further antibiotics necessary at this time.  Given advanced age, +flu, ESRD, and elevated troponin, will plan observation admission. D/w NP Lissa Merlin.  Final Clinical Impressions(s) / ED Diagnoses   Final diagnoses:  None    New Prescriptions New Prescriptions   No medications on file  I personally performed the services described in this documentation, which was scribed in my presence. The recorded information has been reviewed and is accurate.    Ezequiel Essex, MD 12/08/16 [phone]

## 2016-12-08 NOTE — Consult Note (Addendum)
Reason for Consult: ESRD Referring Physician:  Dr.   Laurel Dimmer Complaint: Fevers and AMS  HD orders:  [facility] 3.5hr 2/2 bath EDW is 67kg (left at 67.5-67.7 kg recently) Calcitriol 1.25m PO w/ treatments Heparin 2k units bolus  Assessment/Plan: 1. Influence A positive - receiving Tamiflu x2 w/ a neg CXR and not suggestive of sepsis or PNA. 2. ESRD - MWF @ [facility] and compliant w/ treatments. - Already wrote orders for HD today. - Floor weight and UF to 67kg EDW (he has left recently at 66.7kg); according to [facility] weights he's already below EDW but per wife he has had a good appetite between treatments. 3. HTN - controlled 4. AMS - resolved 5. Anemia - stable (no ESA's) 6. Renal osteodystrophy - restart Calcitriol 1.547mPO w/ treatments   Seen on dialysis at 2pm. 3/2.5 bath qb qd 350/800 thru left FAL AVG 130/57 No changes to regimen.  JWL  HPI: JaRoshard Rezabekcales is an [age].o. male MWF @ [facility] w/ Dr. SaJoelyn Omsith last HD on Wed here with AMS, subjective fevers at home and weakness. He has also had a cough nonproductive for no more than 2 days. He denies having any myalgias, loss of consciousness, dizziness, diarrhea or abdominal pain. He also denies having any CP, palpitations, visual complaints, sore throat or rhinorrhea. His appetite has been @ baseline with no dropoff. His wife was bedside. He was noted to have a clear CXR in the ED with a positive influence A test. Blood cultures were pending and he was afebrile in the ED.  Review of Systems  Respiratory: Positive for wheezing.    Pertinent items are noted in HPI.  Chemistry and CBC: Creatinine, Ser  Date/Time Value Ref Range Status  12/08/2016 03:01 AM 10.70 (H) 0.61 - 1.24 mg/dL Final  10/06/2016 06:47 AM 7.32 (H) 0.61 - 1.24 mg/dL Final  10/05/2016 12:05 AM 9.26 (H) 0.61 - 1.24 mg/dL Final  10/04/2016 06:39 AM 7.51 (H) 0.61 - 1.24 mg/dL Final  02/29/2016 12:53 AM 6.48 (H) 0.61 - 1.24 mg/dL Final  12/08/2015 07:09 AM 8.69 (H) 0.61 -  1.24 mg/dL Final  12/07/2015 03:25 AM 6.72 (H) 0.61 - 1.24 mg/dL Final  12/06/2015 07:54 PM 5.93 (H) 0.61 - 1.24 mg/dL Final  12/06/2015 07:50 PM 5.70 (H) 0.61 - 1.24 mg/dL Final  07/15/2015 03:43 PM 6.45 (HH) 0.40 - 1.50 mg/dL Final  07/10/2015 09:00 AM 5.09 (H) 0.61 - 1.24 mg/dL Final    Comment:    DELTA CHECK NOTED  07/09/2015 04:01 AM 9.30 (H) 0.61 - 1.24 mg/dL Final  07/08/2015 06:36 AM 7.85 (H) 0.61 - 1.24 mg/dL Final  07/07/2015 05:37 AM 10.58 (H) 0.61 - 1.24 mg/dL Final  07/06/2015 04:26 AM 9.74 (H) 0.61 - 1.24 mg/dL Final  07/05/2015 05:49 AM 8.60 (H) 0.61 - 1.24 mg/dL Final  07/05/2015 05:40 AM 9.02 (H) 0.61 - 1.24 mg/dL Final  06/30/2015 12:07 PM 4.04 (H) 0.61 - 1.24 mg/dL Final  06/18/2015 08:20 AM 7.90 (H) 0.61 - 1.24 mg/dL Final  06/16/2015 08:30 AM 8.72 (H) 0.61 - 1.24 mg/dL Final  06/15/2015 04:14 AM 6.65 (H) 0.61 - 1.24 mg/dL Final  06/14/2015 11:56 PM 6.43 (H) 0.61 - 1.24 mg/dL Final  05/06/2015 09:23 AM 6.89 (HH) 0.40 - 1.50 mg/dL Final  01/31/2013 02:26 PM 4.80 (H) 0.50 - 1.35 mg/dL Final  07/26/2012 04:44 PM 4.2 (H) 0.4 - 1.5 mg/dL Final  06/19/2011 04:12 PM 3.9 (H) 0.4 - 1.5 mg/dL Final  06/03/2010 12:00 AM 3.6 (  H) 0.4 - 1.5 mg/dL Final  06/02/2009 12:00 AM 3.0 (H) 0.4 - 1.5 mg/dL Final  03/09/2008 12:00 AM 2.8 (H) 0.4 - 1.5 mg/dL Final    Comment:    See lab report for associated comment(s)  05/28/2007 11:15 AM 2.30 (H)  Final  01/02/2007 07:47 AM 2.7 (H) 0.4 - 1.5 mg/dL Final    Recent Labs Lab 12/08/16 0301  NA 138  K 4.6  CL 93*  CO2 28  GLUCOSE 103*  BUN 48*  CREATININE 10.70*  CALCIUM 9.1    Recent Labs Lab 12/08/16 0301  WBC 5.6  NEUTROABS 3.9  HGB 12.8*  HCT 40.7  MCV 91.1  PLT 160   Liver Function Tests:  Recent Labs Lab 12/08/16 0301  AST 22  ALT 13*  ALKPHOS 50  BILITOT 0.6  PROT 6.5  ALBUMIN 3.6   No results for input(s): LIPASE, AMYLASE in the last 168 hours. No results for input(s): AMMONIA in the last 168  hours. Cardiac Enzymes:  Recent Labs Lab 12/08/16 0301 12/08/16 0551  TROPONINI 0.15* 0.18*   Iron Studies: No results for input(s): IRON, TIBC, TRANSFERRIN, FERRITIN in the last 72 hours. PT/INR: @LABRCNTIP (inr:5)  Xrays/Other Studies: ) Results for orders placed or performed during the hospital encounter of 12/08/16 (from the past 48 hour(s))  CBC with Differential/Platelet     Status: Abnormal   Collection Time: 12/08/16  3:01 AM  Result Value Ref Range   WBC 5.6 4.0 - 10.5 K/uL   RBC 4.47 4.22 - 5.81 MIL/uL   Hemoglobin 12.8 (L) 13.0 - 17.0 g/dL   HCT 40.7 39.0 - 52.0 %   MCV 91.1 78.0 - 100.0 fL   MCH 28.6 26.0 - 34.0 pg   MCHC 31.4 30.0 - 36.0 g/dL   RDW 18.8 (H) 11.5 - 15.5 %   Platelets 160 150 - 400 K/uL   Neutrophils Relative % 69 %   Neutro Abs 3.9 1.7 - 7.7 K/uL   Lymphocytes Relative 9 %   Lymphs Abs 0.5 (L) 0.7 - 4.0 K/uL   Monocytes Relative 21 %   Monocytes Absolute 1.2 (H) 0.1 - 1.0 K/uL   Eosinophils Relative 1 %   Eosinophils Absolute 0.1 0.0 - 0.7 K/uL   Basophils Relative 0 %   Basophils Absolute 0.0 0.0 - 0.1 K/uL  Comprehensive metabolic panel     Status: Abnormal   Collection Time: 12/08/16  3:01 AM  Result Value Ref Range   Sodium 138 135 - 145 mmol/L   Potassium 4.6 3.5 - 5.1 mmol/L   Chloride 93 (L) 101 - 111 mmol/L   CO2 28 22 - 32 mmol/L   Glucose, Bld 103 (H) 65 - 99 mg/dL   BUN 48 (H) 6 - 20 mg/dL   Creatinine, Ser 10.70 (H) 0.61 - 1.24 mg/dL   Calcium 9.1 8.9 - 10.3 mg/dL   Total Protein 6.5 6.5 - 8.1 g/dL   Albumin 3.6 3.5 - 5.0 g/dL   AST 22 15 - 41 U/L   ALT 13 (L) 17 - 63 U/L   Alkaline Phosphatase 50 38 - 126 U/L   Total Bilirubin 0.6 0.3 - 1.2 mg/dL   GFR calc non Af Amer 4 (L) >60 mL/min   GFR calc Af Amer 4 (L) >60 mL/min    Comment: (NOTE) The eGFR has been calculated using the CKD EPI equation. This calculation has not been validated in all clinical situations. eGFR's persistently <60 mL/min signify possible Chronic  Kidney  Disease.    Anion gap 17 (H) 5 - 15  Troponin I     Status: Abnormal   Collection Time: 12/08/16  3:01 AM  Result Value Ref Range   Troponin I 0.15 (HH) <0.03 ng/mL    Comment: CRITICAL RESULT CALLED TO, READ BACK BY AND VERIFIED WITH: OLSEN E,RN 12/08/16 0400 WAYK   I-Stat CG4 Lactic Acid, ED     Status: None   Collection Time: 12/08/16  3:06 AM  Result Value Ref Range   Lactic Acid, Venous 0.89 0.5 - 1.9 mmol/L  Influenza panel by PCR (type A & B)     Status: Abnormal   Collection Time: 12/08/16  4:21 AM  Result Value Ref Range   Influenza A By PCR POSITIVE (A) NEGATIVE   Influenza B By PCR NEGATIVE NEGATIVE    Comment: (NOTE) The Xpert Xpress Flu assay is intended as an aid in the diagnosis of  influenza and should not be used as a sole basis for treatment.  This  assay is FDA approved for nasopharyngeal swab specimens only. Nasal  washings and aspirates are unacceptable for Xpert Xpress Flu testing.   Troponin I     Status: Abnormal   Collection Time: 12/08/16  5:51 AM  Result Value Ref Range   Troponin I 0.18 (HH) <0.03 ng/mL    Comment: CRITICAL VALUE NOTED.  VALUE IS CONSISTENT WITH PREVIOUSLY REPORTED AND CALLED VALUE.  I-Stat CG4 Lactic Acid, ED     Status: None   Collection Time: 12/08/16  6:05 AM  Result Value Ref Range   Lactic Acid, Venous 0.66 0.5 - 1.9 mmol/L   Dg Chest 2 View  Result Date: 12/08/2016 CLINICAL DATA:  Fever and altered mental status at 01:15 this morning. Nonproductive cough appear EXAM: CHEST  2 VIEW COMPARISON:  12/06/2015 FINDINGS: The lungs are clear. The pulmonary vasculature is normal. No pleural effusions. Hilar, mediastinal and cardiac contours are unremarkable and unchanged. Marked thoracic scoliosis. IMPRESSION: No active cardiopulmonary disease. Electronically Signed   By: Andreas Newport M.D.   On: 12/08/2016 03:31    PMH:   Past Medical History:  Diagnosis Date  . ANEMIA-IRON DEFICIENCY 03/09/2008  . Arthritis    cervical  spine.   . Complex renal cyst 06/19/2011  . Depression 03/26/2014  . DIVERTICULOSIS, COLON 03/09/2008  . ESRD (end stage renal disease) ([place]) 03/09/2008  . GOUT 03/09/2008  . Hemorrhoids 08/2009   internal.   . History of blood transfusion   . HYPERLIPIDEMIA 03/09/2008  . Hyperparathyroidism ([place])   . HYPERTENSION 03/09/2008  . Prostate cancer [facility]) 2002   Completed external beam radiation 2003.per HPI  . PROSTATE CANCER, HX OF 03/09/2008  . Radiation cystitis 2010.  Marland Kitchen Stroke [facility])     PSH:   Past Surgical History:  Procedure Laterality Date  . AV FISTULA PLACEMENT Left 01/24/2013   Procedure: INSERTION OF ARTERIOVENOUS (AV) GORE-TEX GRAFT ARM;  Surgeon: Rosetta Posner, MD;  Location: [place];  Service: Vascular;  Laterality: Left;  . COLONOSCOPY N/A 06/17/2015   Procedure: COLONOSCOPY;  Surgeon: Gatha Mayer, MD;  Location: [place];  Service: Endoscopy;  Laterality: N/A;  . ESOPHAGOGASTRODUODENOSCOPY N/A 06/17/2015   Procedure: ESOPHAGOGASTRODUODENOSCOPY (EGD);  Surgeon: Gatha Mayer, MD;  Location[facility] ENDOSCOPY;  Service: Endoscopy;  Laterality: N/A;  . FLEXIBLE SIGMOIDOSCOPY N/A 06/15/2015   Procedure: FLEXIBLE SIGMOIDOSCOPY;  Surgeon: Gatha Mayer, MD;  Location: [place];  Service: Endoscopy;  Laterality: N/A;  . GIVENS CAPSULE STUDY  N/A 07/06/2015   Procedure: GIVENS CAPSULE STUDY;  Surgeon: Milus Banister, MD;  Location: [place];  Service: Endoscopy;  Laterality: N/A;  . JOINT REPLACEMENT     HIP  . rotator cuff repair right  4/08  . s/p left hip replacement  2007   Dr. Percell Miller ortho  . TONSILLECTOMY      Allergies: No Known Allergies  Medications:   Prior to Admission medications   Medication Sig Start Date End Date Taking? Authorizing Provider  allopurinol (ZYLOPRIM) 100 MG tablet TAKE 1 TABLET DAILY 08/07/16  Yes [name], MD  calcitRIOL (ROCALTROL) 0.5 MCG capsule Take 2 capsules (1 mcg total) by mouth every Monday, Wednesday, and Friday with hemodialysis.  10/06/16  Yes Doreatha Lew, MD  citalopram (CELEXA) 10 MG tablet Take 1 tablet (10 mg total) by mouth daily. 08/07/16  Yes [name], MD  docusate sodium (COLACE) 100 MG capsule Take 2 capsules (200 mg total) by mouth 2 (two) times daily. 06/22/15  Yes Thurnell Lose, MD  doxercalciferol (HECTOROL) 0.5 MCG capsule Take 1.5 mcg by mouth 3 (three) times a week. MWF   Yes Historical Provider, MD  midodrine (PROAMATINE) 5 MG tablet Take 1 tablet (5 mg total) by mouth 3 (three) times daily. Take in the morning, lunch time and 4pm. Do not take after 4pm. Patient taking differently: Take 5 mg by mouth 3 (three) times daily as needed (low blood pressure). Take in the morning, lunch time and 4pm. Do not take after 4pm. 09/07/16  Yes Rosalin Hawking, MD  Multiple Vitamin (MULTIVITAMIN WITH MINERALS) TABS tablet Take 1 tablet by mouth daily.   Yes Historical Provider, MD  nystatin (MYCOSTATIN/NYSTOP) powder Use as directed twice per day Patient taking differently: Apply 1 g topically 2 (two) times daily as needed (rash).  07/06/16  Yes [name], MD  rosuvastatin (CRESTOR) 20 MG tablet Take 1 tablet (20 mg total) by mouth at bedtime. 01/17/16  Yes [name], MD    Discontinued Meds:  There are no discontinued medications.  Social History:  reports that he quit smoking about 34 years ago. His smoking use included Cigarettes. He quit after 5.00 years of use. He has never used smokeless tobacco. He reports that he does not drink alcohol or use drugs.  Family History:   Family History  Problem Relation Age of Onset  . Hypertension Father   . Diabetes Father   . Cancer Father     prostate cancer  . Hypertension Brother   . Cancer Brother     Blood pressure 132/63, pulse 73, temperature 98.2 F (36.8 C), temperature source Oral, resp. rate 14, height 5' 9"  (1.753 m), weight 63.5 kg (140 lb), SpO2 100 %. General appearance: alert, cooperative and appears stated age Head: Normocephalic, without  obvious abnormality, atraumatic Eyes: negative Neck: no adenopathy, no carotid bruit, no JVD, supple, symmetrical, trachea midline and thyroid not enlarged, symmetric, no tenderness/mass/nodules Back: symmetric, no curvature. ROM normal. No CVA tenderness. Resp: rhonchi bilaterally Chest wall: no tenderness Cardio: regular rate and rhythm, S1, S2 normal, no murmur, click, rub or gallop GI: soft, non-tender; bowel sounds normal; no masses,  no organomegaly Extremities: extremities normal, atraumatic, no cyanosis or edema Pulses: 2+ and symmetric Skin: Skin color, texture, turgor normal. No rashes or lesions Lymph nodes: Cervical, supraclavicular, and axillary nodes normal. Neurologic: Alert and oriented X 3, normal strength and tone. Normal symmetric reflexes. Normal coordination and gait  Dwana Melena, MD 12/08/2016, 8:28 AM

## 2016-12-08 NOTE — H&P (Signed)
History and Physical    [name] NID:[id number] DOB: 01/03/1929 DOA: 12/08/2016   PCP: [name], MD   Patient coming from/Resides with: Private residence/lives with wife  Admission status: Observation/telemetry -medically necessary to stay a minimum 2 midnights to rule out impending and/or unexpected changes in physiologic status that may differ from initial evaluation performed in the ER and/or at time of admission.   Chief Complaint: Fever and altered mental status  HPI: [name] is a 83 y.o. male with medical history significant for prior CVA, chronic kidney disease on dialysis, hypertension, dyslipidemia, anemia of chronic kidney disease, history of GI bleeding secondary to diverticulosis, gout and mild chronic elevation in troponin. Patient was brought to the hospital today after awakening confused during the night. When his wife checked his temperature she noted it was 103F. Of note, the wife explains that the patient has been coughing for 2 days (nonproductive) and she has been regularly checking his temperature and only during the night as documented above did he finally developed a fever. He has not had any nausea vomiting or diarrhea. Evaluation in the ER revealed the patient to be positive for influenza A.  ED Course:  Vital Signs: BP 132/63 (BP Location: Right Arm)   Pulse 73   Temp 98.2 F (36.8 C) (Oral)   Resp 14   Ht 5\' 9"  (1.753 m)   Wt 63.5 kg (140 lb)   SpO2 100%   BMI 20.67 kg/m  2 view chest x-ray: No acute disease Lab data: Sodium 138, potassium 4.6, chloride 93, CO2 28, glucose 103, BUN 48, creatinine 10.7, anion gap 17, LFTs normal, troponin positive 2 (0.15 and 0.18), lactic acid negative 2 (0.9 and 0.66), white count 5600 with neutrophils 69%, lymphocytes 9, absolute lymphocyte 0.5 monocytes 1.2, influenza A positive, the cultures obtained in the ER. Medications and treatments: Tylenol 650 mg 1, Tamiflu 30 mg 1   Review of Systems:  In  addition to the HPI above,  No Headache, changes with Vision or hearing, new weakness, tingling, numbness in any extremity, dizziness, dysarthria or word finding difficulty, gait disturbance or imbalance, tremors or seizure activity No problems swallowing food or Liquids, indigestion/reflux, choking or coughing while eating, abdominal pain with or after eating No Chest pain, Shortness of Breath, palpitations, orthopnea or DOE No Abdominal pain, N/V, melena,hematochezia, dark tarry stools, constipation No dysuria, malodorous urine, hematuria or flank pain No new skin rashes, lesions, masses or bruises, No new joint pains, aches, swelling or redness No recent unintentional weight gain or loss No polyuria, polydypsia or polyphagia   Past Medical History:  Diagnosis Date  . ANEMIA-IRON DEFICIENCY 03/09/2008  . Arthritis    cervical spine.   . Complex renal cyst 06/19/2011  . Depression 03/26/2014  . DIVERTICULOSIS, COLON 03/09/2008  . ESRD (end stage renal disease) ([place]) 03/09/2008  . GOUT 03/09/2008  . Hemorrhoids 08/2009   internal.   . History of blood transfusion   . HYPERLIPIDEMIA 03/09/2008  . Hyperparathyroidism ([place])   . HYPERTENSION 03/09/2008  . Prostate cancer [facility]) 2002   Completed external beam radiation 2003.per HPI  . PROSTATE CANCER, HX OF 03/09/2008  . Radiation cystitis 2010.  Marland Kitchen Stroke [facility])     Past Surgical History:  Procedure Laterality Date  . AV FISTULA PLACEMENT Left 01/24/2013   Procedure: INSERTION OF ARTERIOVENOUS (AV) GORE-TEX GRAFT ARM;  Surgeon: Rosetta Posner, MD;  Location: [place];  Service: Vascular;  Laterality: Left;  . COLONOSCOPY N/A 06/17/2015  Procedure: COLONOSCOPY;  Surgeon: Gatha Mayer, MD;  Location: [place];  Service: Endoscopy;  Laterality: N/A;  . ESOPHAGOGASTRODUODENOSCOPY N/A 06/17/2015   Procedure: ESOPHAGOGASTRODUODENOSCOPY (EGD);  Surgeon: Gatha Mayer, MD;  Location: [facility] ENDOSCOPY;  Service: Endoscopy;  Laterality: N/A;  . FLEXIBLE  SIGMOIDOSCOPY N/A 06/15/2015   Procedure: FLEXIBLE SIGMOIDOSCOPY;  Surgeon: Gatha Mayer, MD;  Location: [place];  Service: Endoscopy;  Laterality: N/A;  . GIVENS CAPSULE STUDY N/A 07/06/2015   Procedure: GIVENS CAPSULE STUDY;  Surgeon: Milus Banister, MD;  Location: [place];  Service: Endoscopy;  Laterality: N/A;  . JOINT REPLACEMENT     HIP  . rotator cuff repair right  4/08  . s/p left hip replacement  2007   Dr. Percell Miller ortho  . TONSILLECTOMY      Social History   Social History  . Marital status: Married    Spouse name: N/A  . Number of children: 2  . Years of education: N/A   Occupational History  . retired Print production planner    Social History Main Topics  . Smoking status: Former Smoker    Years: 5.00    Types: Cigarettes    Quit date: 09/17/1982  . Smokeless tobacco: Never Used  . Alcohol use No     Comment: rare  . Drug use: No  . Sexual activity: Not on file   Other Topics Concern  . Not on file   Social History Narrative  . No narrative on file    Mobility: Cane or walker Work history: Not obtained   No Known Allergies  Family History  Problem Relation Age of Onset  . Hypertension Father   . Diabetes Father   . Cancer Father     prostate cancer  . Hypertension Brother   . Cancer Brother     Prior to Admission medications   Medication Sig Start Date End Date Taking? Authorizing Provider  allopurinol (ZYLOPRIM) 100 MG tablet TAKE 1 TABLET DAILY 08/07/16  Yes [name], MD  calcitRIOL (ROCALTROL) 0.5 MCG capsule Take 2 capsules (1 mcg total) by mouth every Monday, Wednesday, and Friday with hemodialysis. 10/06/16  Yes Doreatha Lew, MD  citalopram (CELEXA) 10 MG tablet Take 1 tablet (10 mg total) by mouth daily. 08/07/16  Yes [name], MD  docusate sodium (COLACE) 100 MG capsule Take 2 capsules (200 mg total) by mouth 2 (two) times daily. 06/22/15  Yes Thurnell Lose, MD  doxercalciferol (HECTOROL) 0.5 MCG capsule Take 1.5  mcg by mouth 3 (three) times a week. MWF   Yes Historical Provider, MD  midodrine (PROAMATINE) 5 MG tablet Take 1 tablet (5 mg total) by mouth 3 (three) times daily. Take in the morning, lunch time and 4pm. Do not take after 4pm. Patient taking differently: Take 5 mg by mouth 3 (three) times daily as needed (low blood pressure). Take in the morning, lunch time and 4pm. Do not take after 4pm. 09/07/16  Yes Rosalin Hawking, MD  Multiple Vitamin (MULTIVITAMIN WITH MINERALS) TABS tablet Take 1 tablet by mouth daily.   Yes Historical Provider, MD  nystatin (MYCOSTATIN/NYSTOP) powder Use as directed twice per day Patient taking differently: Apply 1 g topically 2 (two) times daily as needed (rash).  07/06/16  Yes [name], MD  rosuvastatin (CRESTOR) 20 MG tablet Take 1 tablet (20 mg total) by mouth at bedtime. 01/17/16  Yes [name], MD    Physical Exam: Vitals:   12/08/16 7544 12/08/16  0630 12/08/16 0645 12/08/16 0717  BP: 139/82 133/64 137/61 132/63  Pulse: 75 77 78 73  Resp: 17 15 15 14   Temp:    98.2 F (36.8 C)  TempSrc:    Oral  SpO2: 97% 96% 96% 100%  Weight:      Height:          Constitutional: NAD, calm, comfortable Eyes: PERRL, lids and conjunctivae normal ENMT: Mucous membranes are moist. Posterior pharynx clear of any exudate or lesions.Normal dentition.  Neck: normal, supple, no masses, no thyromegaly Respiratory: clear to auscultation bilaterally, no wheezing, no crackles. Normal respiratory effort. No accessory muscle use.  Cardiovascular: Regular rate and rhythm, grade 1/6 systolic murmur; No rubs / gallops. No extremity edema. 2+ pedal pulses. No carotid bruits.  Abdomen: no tenderness, no masses palpated. No hepatosplenomegaly. Bowel sounds positive.  Musculoskeletal: no clubbing / cyanosis. No joint deformity upper and lower extremities. Good ROM, no contractures. Normal muscle tone.  Skin: no rashes, lesions, ulcers. No induration Neurologic: CN 2-12 grossly intact.  Sensation intact, DTR normal. Strength 5/5 x all 4 extremities.  Psychiatric:  Alert and oriented x name and place. Normal mood.    Labs on Admission: I have personally reviewed following labs and imaging studies  CBC:  Recent Labs Lab 12/08/16 0301  WBC 5.6  NEUTROABS 3.9  HGB 12.8*  HCT 40.7  MCV 91.1  PLT 518   Basic Metabolic Panel:  Recent Labs Lab 12/08/16 0301  NA 138  K 4.6  CL 93*  CO2 28  GLUCOSE 103*  BUN 48*  CREATININE 10.70*  CALCIUM 9.1   GFR: Estimated Creatinine Clearance: 4.5 mL/min (by C-G formula based on SCr of 10.7 mg/dL (H)). Liver Function Tests:  Recent Labs Lab 12/08/16 0301  AST 22  ALT 13*  ALKPHOS 50  BILITOT 0.6  PROT 6.5  ALBUMIN 3.6   No results for input(s): LIPASE, AMYLASE in the last 168 hours. No results for input(s): AMMONIA in the last 168 hours. Coagulation Profile: No results for input(s): INR, PROTIME in the last 168 hours. Cardiac Enzymes:  Recent Labs Lab 12/08/16 0301 12/08/16 0551  TROPONINI 0.15* 0.18*   BNP (last 3 results) No results for input(s): PROBNP in the last 8760 hours. HbA1C: No results for input(s): HGBA1C in the last 72 hours. CBG: No results for input(s): GLUCAP in the last 168 hours. Lipid Profile: No results for input(s): CHOL, HDL, LDLCALC, TRIG, CHOLHDL, LDLDIRECT in the last 72 hours. Thyroid Function Tests: No results for input(s): TSH, T4TOTAL, FREET4, T3FREE, THYROIDAB in the last 72 hours. Anemia Panel: No results for input(s): VITAMINB12, FOLATE, FERRITIN, TIBC, IRON, RETICCTPCT in the last 72 hours. Urine analysis:    Component Value Date/Time   COLORURINE YELLOW 10/04/2016 [place] 10/04/2016 1344   LABSPEC 1.009 10/04/2016 1344   PHURINE 8.5 (H) 10/04/2016 1344   GLUCOSEU NEGATIVE 10/04/2016 1344   HGBUR NEGATIVE 10/04/2016 1344   BILIRUBINUR NEGATIVE 10/04/2016 1344   KETONESUR NEGATIVE 10/04/2016 1344   PROTEINUR 100 (A) 10/04/2016 1344    UROBILINOGEN 0.2 07/06/2015 0003   NITRITE NEGATIVE 10/04/2016 1344   LEUKOCYTESUR NEGATIVE 10/04/2016 1344   Sepsis Labs: @LABRCNTIP (procalcitonin:4,lacticidven:4) )No results found for this or any previous visit (from the past 240 hour(s)).   Radiological Exams on Admission: Dg Chest 2 View  Result Date: 12/08/2016 CLINICAL DATA:  Fever and altered mental status at 01:15 this morning. Nonproductive cough appear EXAM: CHEST  2 VIEW COMPARISON:  12/06/2015 FINDINGS:  The lungs are clear. The pulmonary vasculature is normal. No pleural effusions. Hilar, mediastinal and cardiac contours are unremarkable and unchanged. Marked thoracic scoliosis. IMPRESSION: No active cardiopulmonary disease. Electronically Signed   By: Andreas Newport M.D.   On: 12/08/2016 03:31    EKG: (Independently reviewed) Sinus rhythm ventricular rate 94 bpm, QTC 464 ms, nonspecific ST elevation V2 through V4 but unchanged from previous EKG  Assessment/Plan Principal Problem:   Influenza A - patient presents from home with altered mentation and fever with subsequent PCR positive for influenza A -Currently not hypoxemic and chest x-ray negative -No signs of sepsis -Continue supportive care-no IV fluids since dialysis patient but will not order fluid restriction and instead encourage oral intake of fluids -Tamiflu 30 mg by mouth twice a day 5 days-1st dose given in ER -Given advanced age and need for dialysis patient is at risk for flu related complications so therefore have opted to admit patient to the hospital for a minimum 24-hour observation.  Active Problems:   Acute encephalopathy -Improving now that has defervesced -Neuro checks every 4 hours -Seems to have some mild post stroke dementia    Elevated troponin -Chronically elevated: 0.10-0.11 -Current trend slightly higher at 0.15 and 0.8 several check at least one more time -No chest pain and EKG unremarkable -Telemetry monitoring    Essential  hypertension -Current blood pressure controlled -Not on antihypertensives at home and primarily controlled with dialysis -Has "post dialysis hypotension"-so continue midodrine     ESRD on dialysis  -MWF schedule -Nephrology plans to take to dialysis today    Iron deficiency anemia/Anemia in chronic kidney disease  -Appears hemoconcentrated noting baseline hemoglobin between 8.6 and 9.6 with current hemoglobin 12.8 -Repeat CBC in a.m.    HLD (hyperlipidemia) -Continue Crestor       DVT prophylaxis: subcutaneous heparin Code Status: full Family Communication: wife at bedside Disposition Plan: anticipate discharge back to preadmission home environment when medically stable Consults called: nephrology     Brooksie Ellwanger L. ANP-BC [facility] Pager [phone]   If 7PM-7AM, please contact night-coverage [email or web address] Password [email or web address]  12/08/2016, 7:33 AM

## 2016-12-08 NOTE — Progress Notes (Signed)
New Admission Note:   Arrival Method: Bed Mental Orientation: A&O X4 Telemetry: Initiated Assessment: Completed Skin: Clean, Dry, Intact Iv: Clean, Dry, Intact Pain: Denies Admission: Completed Unit Orientation: Patient has been orientated to the room, unit and staff.  Family: Wife at bedside  Orders have been reviewed and implemented. Will continue to monitor the patient. Call light has been placed within reach and bed alarm has been activated.    Dixie Dials RN, BSN

## 2016-12-08 NOTE — Progress Notes (Signed)
PHARMACY NOTE:  ANTIMICROBIAL RENAL DOSAGE ADJUSTMENT  Current antimicrobial regimen includes a mismatch between antimicrobial dosage and estimated renal function.  As per policy approved by the Pharmacy & Therapeutics and Medical Executive Committees, the antimicrobial dosage will be adjusted accordingly.  Current antimicrobial dosage:  tamiflu 30mg  BID   Indication: influenza  Renal Function:  Estimated Creatinine Clearance: 4.7 mL/min (by C-G formula based on SCr of 10.7 mg/dL (H)). []      On intermittent HD, scheduled: []      On CRRT    Antimicrobial dosage has been changed to:  tamiflu 30mg  qHD   [name], [facility] 12/08/2016 9:49 AM

## 2016-12-08 NOTE — ED Notes (Signed)
Pt. attempted to use urinal standing up with no success , EDP notified .

## 2016-12-08 NOTE — ED Notes (Signed)
Re-paged Nephrology

## 2016-12-08 NOTE — ED Notes (Signed)
Pt. unable to give urine specimen at this time , urinal at bedside .

## 2016-12-09 DIAGNOSIS — D631 Anemia in chronic kidney disease: Secondary | ICD-10-CM | POA: Diagnosis not present

## 2016-12-09 DIAGNOSIS — J101 Influenza due to other identified influenza virus with other respiratory manifestations: Secondary | ICD-10-CM

## 2016-12-09 DIAGNOSIS — G9340 Encephalopathy, unspecified: Secondary | ICD-10-CM

## 2016-12-09 DIAGNOSIS — N186 End stage renal disease: Secondary | ICD-10-CM | POA: Diagnosis not present

## 2016-12-09 DIAGNOSIS — I120 Hypertensive chronic kidney disease with stage 5 chronic kidney disease or end stage renal disease: Secondary | ICD-10-CM | POA: Diagnosis not present

## 2016-12-09 DIAGNOSIS — J09X2 Influenza due to identified novel influenza A virus with other respiratory manifestations: Secondary | ICD-10-CM | POA: Diagnosis not present

## 2016-12-09 DIAGNOSIS — Z992 Dependence on renal dialysis: Secondary | ICD-10-CM | POA: Diagnosis not present

## 2016-12-09 LAB — BASIC METABOLIC PANEL
Anion gap: 9 (ref 5–15)
BUN: 25 mg/dL — AB (ref 6–20)
CALCIUM: 8.7 mg/dL — AB (ref 8.9–10.3)
CO2: 27 mmol/L (ref 22–32)
Chloride: 97 mmol/L — ABNORMAL LOW (ref 101–111)
Creatinine, Ser: 7.07 mg/dL — ABNORMAL HIGH (ref 0.61–1.24)
GFR calc Af Amer: 7 mL/min — ABNORMAL LOW (ref >60)
GFR, EST NON AFRICAN AMERICAN: 6 mL/min — AB (ref >60)
GLUCOSE: 86 mg/dL (ref 65–99)
Potassium: 4.5 mmol/L (ref 3.5–5.1)
Sodium: 133 mmol/L — ABNORMAL LOW (ref 135–145)

## 2016-12-09 LAB — CBC
HEMATOCRIT: 38 % — AB (ref 39.0–52.0)
HEMOGLOBIN: 12.1 g/dL — AB (ref 13.0–17.0)
MCH: 28.4 pg (ref 26.0–34.0)
MCHC: 31.8 g/dL (ref 30.0–36.0)
MCV: 89.2 fL (ref 78.0–100.0)
Platelets: 152 10*3/uL (ref 150–400)
RBC: 4.26 MIL/uL (ref 4.22–5.81)
RDW: 18.6 % — AB (ref 11.5–15.5)
WBC: 3.4 10*3/uL — ABNORMAL LOW (ref 4.0–10.5)

## 2016-12-09 LAB — MRSA PCR SCREENING: MRSA BY PCR: NEGATIVE

## 2016-12-09 MED ORDER — DM-GUAIFENESIN ER 30-600 MG PO TB12: 1.0000 | ORAL_TABLET | Freq: Two times a day (BID) | ORAL | 0 refills | Status: DC | PRN

## 2016-12-09 MED ORDER — OSELTAMIVIR PHOSPHATE 30 MG PO CAPS: 30.0000 mg | ORAL_CAPSULE | ORAL | 0 refills | Status: DC

## 2016-12-09 NOTE — Progress Notes (Signed)
Pt discharge instruction given, pt verbalized understanding.  VSS. Denies pain. Pt left floor via wheelchair accompanied by staff and family.

## 2016-12-09 NOTE — Discharge Summary (Signed)
Physician Discharge Summary  Dameir Gentzler Piggott JIR:[id number] DOB: 03/06/1929 DOA: 12/08/2016  PCP: [name], MD  Admit date: 12/08/2016 Discharge date: 12/09/2016  Admitted From:Home  Disposition: Home  Recommendations for Outpatient Follow-up:  1. Follow up with PCP in 1-2 weeks 2. Please obtain BMP/CBC in one week  Home Health: No Equipment/Devices: No Discharge Condition: Stable CODE STATUS: Full code Diet recommendation: Heart healthy  Brief/Interim Summary:83 y.o. male with medical history significant for prior CVA, ESRD on hemodialysis Monday Wednesday Friday, hypertension, dyslipidemia, anemia of chronic kidney disease, history of GI bleeding secondary to diverticulosis, gout and mild chronic elevation in troponin presented with confusion. Patient was found to have influenza A positive. He most likely has acute encephalopathy, metabolic in the setting of influenza. Reportedly his mental status improved during admission. He remained afebrile. Started on Tamiflu and supportive care. Patient received hemodialysis treatment as per nephrologist.  Today, patient reported that he is feeling much better. Denied headache, dizziness, fever, chills, nausea vomiting or abdominal pain. Able to tolerate diet. Received a dose of Tamiflu in the hospital. We will discharge with 2 more doses after hemodialysis treatment to complete the course. I advised patient to follow-up with his PCP and nephrologist. He is medically stable at this time.  Patient denied chest pain or shortness of breath. He has no focal neurological deficit.  Discharge Diagnoses:  Principal Problem:   Influenza A Active Problems:   Iron deficiency anemia   Essential hypertension   HLD (hyperlipidemia)   ESRD on dialysis (HCC)   Anemia in chronic kidney disease (CKD)   Acute encephalopathy   Elevated troponin    Discharge Instructions  Discharge Instructions    Call MD for:  difficulty breathing, headache or visual  disturbances    Complete by:  As directed    Call MD for:  extreme fatigue    Complete by:  As directed    Call MD for:  hives    Complete by:  As directed    Call MD for:  persistant dizziness or light-headedness    Complete by:  As directed    Call MD for:  persistant nausea and vomiting    Complete by:  As directed    Call MD for:  severe uncontrolled pain    Complete by:  As directed    Call MD for:  temperature >100.4    Complete by:  As directed    Diet - low sodium heart healthy    Complete by:  As directed    Discharge instructions    Complete by:  As directed    Please follow up with your PCP and with your nephrologist for dialysis treatment.   Increase activity slowly    Complete by:  As directed      Allergies as of 12/09/2016   No Known Allergies     Medication List    TAKE these medications   allopurinol 100 MG tablet Commonly known as:  ZYLOPRIM TAKE 1 TABLET DAILY   calcitRIOL 0.5 MCG capsule Commonly known as:  ROCALTROL Take 2 capsules (1 mcg total) by mouth every Monday, Wednesday, and Friday with hemodialysis.   citalopram 10 MG tablet Commonly known as:  CELEXA Take 1 tablet (10 mg total) by mouth daily.   dextromethorphan-guaiFENesin 30-600 MG 12hr tablet Commonly known as:  MUCINEX DM Take 1 tablet by mouth 2 (two) times daily as needed for cough.   docusate sodium 100 MG capsule Commonly known as:  COLACE Take 2  capsules (200 mg total) by mouth 2 (two) times daily.   doxercalciferol 0.5 MCG capsule Commonly known as:  HECTOROL Take 1.5 mcg by mouth 3 (three) times a week. MWF   midodrine 5 MG tablet Commonly known as:  PROAMATINE Take 1 tablet (5 mg total) by mouth 3 (three) times daily. Take in the morning, lunch time and 4pm. Do not take after 4pm. What changed:  when to take this  reasons to take this  additional instructions   multivitamin with minerals Tabs tablet Take 1 tablet by mouth daily.   nystatin powder Commonly  known as:  MYCOSTATIN/NYSTOP Use as directed twice per day What changed:  how much to take  how to take this  when to take this  reasons to take this  additional instructions   oseltamivir 30 MG capsule Commonly known as:  TAMIFLU Take 1 capsule (30 mg total) by mouth every Monday, Wednesday, and Friday. Please take after dialysis on Monday and Wednesday. Start taking on:  12/11/2016   rosuvastatin 20 MG tablet Commonly known as:  CRESTOR Take 1 tablet (20 mg total) by mouth at bedtime.      Follow-up Information    [name], MD. Schedule an appointment as soon as possible for a visit in 1 week(s).   Specialties:  Internal Medicine, Radiology Contact information: [place] [address] [phone]          No Known Allergies  Consultations: Nephrology  Procedures/Studies: None  Subjective:   Discharge Exam: Vitals:   12/09/16 0556 12/09/16 0911  BP: (!) 126/101 (!) 121/53  Pulse: 76 78  Resp: 18 18  Temp: 97.7 F (36.5 C) 97.5 F (36.4 C)   Vitals:   12/08/16 1400 12/08/16 2059 12/09/16 0556 12/09/16 0911  BP: (!) 130/57 131/66 (!) 126/101 (!) 121/53  Pulse: 76 84 76 78  Resp: 16 16 18 18   Temp: 98 F (36.7 C) 98.5 F (36.9 C) 97.7 F (36.5 C) 97.5 F (36.4 C)  TempSrc: Oral Oral Oral Oral  SpO2: 98% 97% 97% 95%  Weight: 66.6 kg (146 lb 13.2 oz) 67.5 kg (148 lb 12.8 oz)    Height:  5\' 9"  (1.753 m)      General: Pt is alert, awake, not in acute distress Cardiovascular: RRR, S1/S2 +, no rubs, no gallops Respiratory: CTA bilaterally, no wheezing, no rhonchi Abdominal: Soft, NT, ND, bowel sounds + Extremities: no edema, no cyanosis    The results of significant diagnostics from this hospitalization (including imaging, microbiology, ancillary and laboratory) are listed below for reference.     Microbiology: Recent Results (from the past 240 hour(s))  MRSA PCR Screening     Status: None   Collection Time: 12/08/16   9:34 PM  Result Value Ref Range Status   MRSA by PCR NEGATIVE NEGATIVE Final    Comment:        The GeneXpert MRSA Assay (FDA approved for NASAL specimens only), is one component of a comprehensive MRSA colonization surveillance program. It is not intended to diagnose MRSA infection nor to guide or monitor treatment for MRSA infections.      Labs: BNP (last 3 results) No results for input(s): BNP in the last 8760 hours. Basic Metabolic Panel:  Recent Labs Lab 12/08/16 0301 12/08/16 1102 12/09/16 0441  NA 138 137 133*  K 4.6 3.8 4.5  CL 93* 96* 97*  CO2 28 27 27   GLUCOSE 103* 110* 86  BUN 48* 44*  25*  CREATININE 10.70* 9.44* 7.07*  CALCIUM 9.1 8.7* 8.7*  PHOS  --  4.9*  --    Liver Function Tests:  Recent Labs Lab 12/08/16 0301 12/08/16 1102  AST 22  --   ALT 13*  --   ALKPHOS 50  --   BILITOT 0.6  --   PROT 6.5  --   ALBUMIN 3.6 3.1*   No results for input(s): LIPASE, AMYLASE in the last 168 hours. No results for input(s): AMMONIA in the last 168 hours. CBC:  Recent Labs Lab 12/08/16 0301 12/08/16 1103 12/09/16 0441  WBC 5.6 4.4 3.4*  NEUTROABS 3.9  --   --   HGB 12.8* 12.0* 12.1*  HCT 40.7 38.2* 38.0*  MCV 91.1 90.3 89.2  PLT 160 149* 152   Cardiac Enzymes:  Recent Labs Lab 12/08/16 0301 12/08/16 0551 12/08/16 1103  TROPONINI 0.15* 0.18* 0.16*   BNP: Invalid input(s): POCBNP CBG: No results for input(s): GLUCAP in the last 168 hours. D-Dimer No results for input(s): DDIMER in the last 72 hours. Hgb A1c No results for input(s): HGBA1C in the last 72 hours. Lipid Profile No results for input(s): CHOL, HDL, LDLCALC, TRIG, CHOLHDL, LDLDIRECT in the last 72 hours. Thyroid function studies No results for input(s): TSH, T4TOTAL, T3FREE, THYROIDAB in the last 72 hours.  Invalid input(s): FREET3 Anemia work up No results for input(s): VITAMINB12, FOLATE, FERRITIN, TIBC, IRON, RETICCTPCT in the last 72 hours. Urinalysis    Component  Value Date/Time   COLORURINE YELLOW 10/04/2016 1344   APPEARANCEUR CLEAR 10/04/2016 1344   LABSPEC 1.009 10/04/2016 1344   PHURINE 8.5 (H) 10/04/2016 1344   GLUCOSEU NEGATIVE 10/04/2016 1344   HGBUR NEGATIVE 10/04/2016 1344   BILIRUBINUR NEGATIVE 10/04/2016 1344   KETONESUR NEGATIVE 10/04/2016 1344   PROTEINUR 100 (A) 10/04/2016 1344   UROBILINOGEN 0.2 07/06/2015 0003   NITRITE NEGATIVE 10/04/2016 1344   LEUKOCYTESUR NEGATIVE 10/04/2016 1344   Sepsis Labs Invalid input(s): PROCALCITONIN,  WBC,  LACTICIDVEN Microbiology Recent Results (from the past 240 hour(s))  MRSA PCR Screening     Status: None   Collection Time: 12/08/16  9:34 PM  Result Value Ref Range Status   MRSA by PCR NEGATIVE NEGATIVE Final    Comment:        The GeneXpert MRSA Assay (FDA approved for NASAL specimens only), is one component of a comprehensive MRSA colonization surveillance program. It is not intended to diagnose MRSA infection nor to guide or monitor treatment for MRSA infections.      Time coordinating discharge: Over 30 minutes  SIGNED:   Rosita Fire, MD  [facility] 12/09/2016, 10:44 AM  If 7PM-7AM, please contact night-coverage [email or web address] Password [email or web address]

## 2016-12-09 NOTE — Progress Notes (Signed)
[facility] Progress Note   Subjective:  Seen in room. Denies dyspnea, CP, N/V, fever or chills at this time. Feels much better. Seems appropriate, not confused. He tells me he may be discharged today.  Objective Vitals:   12/08/16 1330 12/08/16 1400 12/08/16 2059 12/09/16 0556  BP: 129/67 (!) 130/57 131/66 (!) 126/101  Pulse: 76 76 84 76  Resp: 14 16 16 18   Temp:  98 F (36.7 C) 98.5 F (36.9 C) 97.7 F (36.5 C)  TempSrc:  Oral Oral Oral  SpO2:  98% 97% 97%  Weight:  66.6 kg (146 lb 13.2 oz) 67.5 kg (148 lb 12.8 oz)   Height:   5\' 9"  (1.753 m)    Physical Exam General: Well appearing male, NAD. Heart: RRR; 2/6 murmur. Lungs: CTA anteriorly Abdomen: soft, non-tender Extremities: No LE edema Dialysis Access: AVF + thrill/bruit  Additional Objective Labs: Basic Metabolic Panel:  Recent Labs Lab 12/08/16 0301 12/08/16 1102 12/09/16 0441  NA 138 137 133*  K 4.6 3.8 4.5  CL 93* 96* 97*  CO2 28 27 27   GLUCOSE 103* 110* 86  BUN 48* 44* 25*  CREATININE 10.70* 9.44* 7.07*  CALCIUM 9.1 8.7* 8.7*  PHOS  --  4.9*  --    Liver Function Tests:  Recent Labs Lab 12/08/16 0301 12/08/16 1102  AST 22  --   ALT 13*  --   ALKPHOS 50  --   BILITOT 0.6  --   PROT 6.5  --   ALBUMIN 3.6 3.1*   CBC:  Recent Labs Lab 12/08/16 0301 12/08/16 1103 12/09/16 0441  WBC 5.6 4.4 3.4*  NEUTROABS 3.9  --   --   HGB 12.8* 12.0* 12.1*  HCT 40.7 38.2* 38.0*  MCV 91.1 90.3 89.2  PLT 160 149* 152   Cardiac Enzymes:  Recent Labs Lab 12/08/16 0301 12/08/16 0551 12/08/16 1103  TROPONINI 0.15* 0.18* 0.16*   Studies/Results: Dg Chest 2 View  Result Date: 12/08/2016 CLINICAL DATA:  Fever and altered mental status at 01:15 this morning. Nonproductive cough appear EXAM: CHEST  2 VIEW COMPARISON:  12/06/2015 FINDINGS: The lungs are clear. The pulmonary vasculature is normal. No pleural effusions. Hilar, mediastinal and cardiac contours are unremarkable and  unchanged. Marked thoracic scoliosis. IMPRESSION: No active cardiopulmonary disease. Electronically Signed   By: Andreas Newport M.D.   On: 12/08/2016 03:31   Medications:  . allopurinol  100 mg Oral Daily  . calcitRIOL  1.5 mcg Oral Q M,W,F-HD  . citalopram  10 mg Oral Daily  . docusate sodium  200 mg Oral BID  . heparin  5,000 Units Subcutaneous Q8H  . multivitamin with minerals  1 tablet Oral Daily  . oseltamivir  30 mg Oral Q M,W,F-HD  . rosuvastatin  20 mg Oral QHS  . sodium chloride flush  3 mL Intravenous Q12H    Dialysis Orders: MWF at [facility] 3.5hr 2/2 bath EDW is 67kg (left at 67.5-67.7 kg recently) Calcitriol 1.5mg  PO w/ treatments Heparin 2k units bolus  Assessment/Plan: 1. Influenza A positive: on Tamiflu. Per primary. 2. ESRD: Continue MWF schedule, next 2/5.  3. HTN/volume: BP controlled, no edema. 4. Anemia: hgb > 12. No ESA for now. 5. Secondary hyperparathyroidism: Ca/Phos ok. Continue VDRA. 6. Nutrition: Alb down acutely with illness. Will start supplements as outpt. 7. AMS: resolved.  Veneta Penton, PA-C 12/09/2016, 9:09 AM  [facility] Pager: [phone]  Patient seen and examined. Agree with PA AP. Mild nonproductive  cough but denies f/c/n/v/dyspnea. AMS resolved and pt is very pleasant. Lt FAL AT good bruit.  Otelia Santee, MD [facility]

## 2016-12-09 NOTE — Progress Notes (Signed)
Pt has discharge instructions.  Called patients wife no answer.  Pt stated he tried to call wife no answer.  Called patients son Yuchen Fedor, advised patient was ready for discharge.  He stated he would try to call his mother to let her know he was ready for discharge.

## 2016-12-11 DIAGNOSIS — N186 End stage renal disease: Secondary | ICD-10-CM | POA: Diagnosis not present

## 2016-12-11 DIAGNOSIS — N2581 Secondary hyperparathyroidism of renal origin: Secondary | ICD-10-CM | POA: Diagnosis not present

## 2016-12-12 ENCOUNTER — Ambulatory Visit (INDEPENDENT_AMBULATORY_CARE_PROVIDER_SITE_OTHER): Payer: Medicare Other | Admitting: Neurology

## 2016-12-12 ENCOUNTER — Encounter: Payer: Self-pay | Admitting: Neurology

## 2016-12-12 VITALS — BP 124/59 | HR 71 | Wt 149.0 lb

## 2016-12-12 DIAGNOSIS — I63311 Cerebral infarction due to thrombosis of right middle cerebral artery: Secondary | ICD-10-CM | POA: Diagnosis not present

## 2016-12-12 DIAGNOSIS — Z8719 Personal history of other diseases of the digestive system: Secondary | ICD-10-CM | POA: Diagnosis not present

## 2016-12-12 DIAGNOSIS — N186 End stage renal disease: Secondary | ICD-10-CM | POA: Diagnosis not present

## 2016-12-12 DIAGNOSIS — I951 Orthostatic hypotension: Secondary | ICD-10-CM | POA: Diagnosis not present

## 2016-12-12 DIAGNOSIS — Z992 Dependence on renal dialysis: Secondary | ICD-10-CM | POA: Diagnosis not present

## 2016-12-12 MED ORDER — ASPIRIN EC 81 MG PO TBEC: 81.0000 mg | DELAYED_RELEASE_TABLET | Freq: Every day | ORAL | Status: AC

## 2016-12-12 NOTE — Progress Notes (Signed)
STROKE NEUROLOGY FOLLOW UP NOTE  NAME: [name] DOB: 1929/05/09  REASON FOR VISIT: stroke follow up HISTORY FROM: wife and chart  Today we had the pleasure of seeing [name] in follow-up at our Neurology Clinic. Pt was accompanied by wife.   History Summary Mr. [name] is a 83 y.o. male with history of ESRD on hemodialysis, colonic diverticulosis with LGIB requiring blood transfusion, hypertension and hyperlipidemia was admitted on 07/05/15 for left facial droop, weakness and fatigue after a brief episode of loss of consciousness. MRI showed left centrum semiovale subcortical infarct, likely due to small vessel disease. Stroke work up including TCD, TTE, CUS, venous doppler UE and LE, A1C all negative. LDL 98. Pt had several episodes of GIB with anemia requiring blood transfusion so that ASA was on hold initially. EGD/colonoscopy on 8/12 was negative, and Capsule Endoscopy negative as well. GI suspects a stuttering diverticular bleed. ASA was resumed after GI agreement. Pt was discharged with ASA and crestor.  09/09/15 follow up - the patient has been doing well. No more GIB. Continues on ASA without issue. His BP at home 112-114 but today 98/50. Pt asymptomatic. Denies any dizziness or feeling weak. atenolol discontinued so far. Continues to have HD for ESRD. Asking for driving.    03/07/16 follow up - he was doing well. He went to ER on 02/28/16 for episode of passing out. He had HD that day in the morning. In the evening, he was having shower, he bent down to get shower material and when he got up, he passed out and fall. Wife got him out of shower and put him in supine position, he gradually recovered. EMS checked his orthostatic vital showed difference of BPs and sent him to ER. He was conscious and no confusion or post ictal with EMS. In ER BP recovered and he was sent back home. As per wife, he is taking atenolol 25mg  on dialysis days and 50mg  other days. BP usually 100-110 at  home. Today BP 112/55.  09/07/16 follow up - pt has been doing well. Followed with cardiology for syncope management. Took off all BP meds and just had 30 day cardiac event monitoring, report pending. However, his BP still low. Today not his dialysis day BP 98/49. Wife said sometimes with dialysis BP down to 70s. Sometimes he stand up from chair having pre-syncope like condition, staring off and imbalance. No other complains.  Interval History During the interval time, pt was admitted on 10/04/16 for anemia, found to have positive FOB, received 2 PRBC transfusion, ASA on hold. Followed up with PCP on 10/12/16 and considered anemia most likely due to ESRD as he had extensive GI work up in the past, only showed diverticulosis. However, ASA still on hold. 12/09/15 pt had fever, confusion and admitted to [facility] for flu, discharged with tamiflu several days ago. Currently, doing well. On midodrine and BP 124/59.   REVIEW OF SYSTEMS: Full 14 system review of systems performed and notable only for those listed below and in HPI above, all others are negative:  Constitutional:   Cardiovascular:  Ear/Nose/Throat:   Skin: itching Eyes:   Respiratory:  cough Gastroitestinal:   Genitourinary:  Hematology/Lymphatic:  anemia Endocrine:  Musculoskeletal:  Back pain Allergy/Immunology:   Neurological:   Psychiatric:  Sleep:   The following represents the patient's updated allergies and side effects list: No Known Allergies  The neurologically relevant items on the patient's problem list were reviewed on today's visit.  Neurologic Examination  A problem focused neurological exam (12 or more points of the single system neurologic examination, vital signs counts as 1 point, cranial nerves count for 8 points) was performed.  Blood pressure (!) 124/59, pulse 71, weight 149 lb (67.6 kg).  General - Well nourished, well developed, in no apparent distress, with face mask.  Ophthalmologic - Fundi not visualized  due to eye movement.  Cardiovascular - Regular rate and rhythm.  Mental Status -  Level of arousal and orientation to time, place, and person were intact. Language including expression, naming, repetition, comprehension was assessed and found intact. Fund of Knowledge was assessed and was intact impaired.  Cranial Nerves II - XII - II - Visual field intact OU. III, IV, VI - Extraocular movements intact. V - Facial sensation intact bilaterally. VII - facial symmetrical VIII - Hearing & vestibular intact bilaterally. X - Palate elevates symmetrically. XI - Chin turning & shoulder shrug intact bilaterally. XII - Tongue protrusion intact.  Motor Strength - The patient's strength was normal in all extremities and pronator drift was absent.  Bulk was normal and fasciculations were absent.   Motor Tone - Muscle tone was assessed at the neck and appendages and was normal.  Reflexes - The patient's reflexes were 1+ in all extremities and he had no pathological reflexes.  Sensory - Light touch, temperature/pinprick were assessed and were normal.    Coordination - The patient had normal movements in the hands and feet with no ataxia or dysmetria.  Tremor was absent.  Gait and Station - walk with cane and stooped posturing, slow and small stride.  Data reviewed: I personally reviewed the images and agree with the radiology interpretations.  Mr Brain Wo Contrast 07/05/2015 1 x 1 x 2 cm area of nonhemorrhagic acute infarction affecting the subcortical and periventricular deep white matter of the RIGHT centrum semiovale. Chronic changes as described.   CUS - Bilateral: 1-39% ICA stenosis. Vertebral artery flow is antegrade.  UE venous doppler - Bilateral: No evidence of DVT or superficial thrombosis. Left: Hemodialysis graft is patent, however, there is not complete color fill. No echoes within the graft to suggest thrombus.   LE venous doppler - negative for DVT bilaterally  2D  echo - Left ventricle: The cavity size was normal. Systolic function washyperdynamic. The estimated ejection fraction was in the range of70% to 75%. There was dynamic obstruction in the mid cavity, witha peak velocity of 200 cm/sec and a peak gradient of 16 mm Hg.Wall motion was normal; there were no regional wall motionabnormalities. There was an increased relative contribution ofatrial contraction to ventricular filling, which may be due tohypovolemia. - Aortic valve: Valve area (VTI): 2.73 cm^2. Valve area (Vmax):2.05 cm^2. Valve area (Vmean): 2.14 cm^2. - Right ventricle: Systolic function was hyperdynamic. - Pulmonary arteries: PA peak pressure: 33 mm Hg (S). - Systemic veins: The inferior vena cava is collapsed. Impressions: Findings are consistent with hypovolemia.  TCD - This was a normal transcranial Doppler study, with normal flow direction and velocity of all identified vessels of the anterior and posterior circulations, with no evidence of stenosis, vasospasm or occlusion. There was no evidence of intracranial disease.  30 day cardiac event monitoring - NSR with occasional PVCs   Component     Latest Ref Rng 07/05/2015  Cholesterol     0 - 200 mg/dL 188  Triglycerides     <150 mg/dL 123  HDL Cholesterol     >40 mg/dL 65  Total CHOL/HDL  Ratio      2.9  VLDL     0 - 40 mg/dL 25  LDL (calc)     0 - 99 mg/dL 98  Hemoglobin A1C     4.8 - 5.6 % 4.9  Mean Plasma Glucose      94  TSH     0.350 - 4.500 uIU/mL 0.551  Free T4     0.61 - 1.12 ng/dL 1.00    Assessment: As you may recall, he is a 83 y.o. African American male with PMH of ESRD on hemodialysis, colonic diverticulosis with LGIB requiring blood transfusion, hypertension and hyperlipidemia was admitted on 07/05/15 for left centrum semiovale subcortical infarct, likely due to small vessel disease. Stroke work up including TCD, TTE, CUS, venous doppler UE and LE, A1C all negative. LDL 98. Pt had GIB with anemia  requiring blood transfusion so that ASA was on hold initially. GI work up negative and ASA was resumed after GI agreement. Pt was discharged with ASA and crestor. During the interval time, no GIB. On ASA without issue. BP at low side but asymptomatic. However, he had syncope episode on 02/28/16. BP borderline low and positive orthostatic. No seizure or stroke. He is off all BP meds now, but BP still low, even lower on dialysis days, put on midodrine, BP much improved. 30 day cardiac monitoring no afib. However, had admission 09/2016 for anemia required for PRBC. ASA on hold again. Anemia more likely due to ESRD instead of GIB. Currently Hb 12.1, will recommend resume ASA EC 81mg  and close monitoring. Pt and wife agreed.  Plan:  - would recommend to restart ASA EC 81mg  for stroke prevention as GI has cleared pt for GI cause of anemia.  - continue follow up with kidney doctor and get iron infusion - Continue crestor for stroke prevention - will prescribe midodrine 5mg  three times a day, take in the morning, lunch time and 4pm. Do not take after 4pm.  - BP goal 110-130 - Follow up with your primary care physician for stroke risk factor modification. Recommend maintain blood pressure goal 110-130/80, diabetes with hemoglobin A1c goal below 6.5% and lipids with LDL cholesterol goal below 70 mg/dL.  - recheck CBC next visit to make sure no worsening anemia on ASA - follow up in 3 months with [facility] or [place], if stable, can be discharged.   I spent more than 25 minutes of face to face time with the patient. Greater than 50% of time was spent in counseling and coordination of care. We have discussed about BP management, ASA use, and close monitor GIB.   No orders of the defined types were placed in this encounter.   Meds ordered this encounter  Medications  . aspirin EC 81 MG tablet    Sig: Take 1 tablet (81 mg total) by mouth daily.    Patient Instructions  - would recommend to restart ASA EC 81mg   for stroke prevention as GI has cleared pt for GI cause of anemia.  - continue follow up with kidney doctor and get iron infusion - Continue crestor for stroke prevention - will prescribe midodrine 5mg  three times a day, take in the morning, lunch time and 4pm. Do not take after 4pm.  - BP goal 110-130 - Follow up with your primary care physician for stroke risk factor modification. Recommend maintain blood pressure goal 110-130/80, diabetes with hemoglobin A1c goal below 6.5% and lipids with LDL cholesterol goal below 70 mg/dL.  - recheck CBC  next visit to make sure no worsening anemia on ASA - follow up in 3 months   Rosalin Hawking, MD PhD [facility] [address] [place], [place] [address] [phone]

## 2016-12-12 NOTE — Patient Instructions (Signed)
-   would recommend to restart ASA EC 81mg  for stroke prevention as GI has cleared pt for GI cause of anemia.  - continue follow up with kidney doctor and get iron infusion - Continue crestor for stroke prevention - will prescribe midodrine 5mg  three times a day, take in the morning, lunch time and 4pm. Do not take after 4pm.  - BP goal 110-130 - Follow up with your primary care physician for stroke risk factor modification. Recommend maintain blood pressure goal 110-130/80, diabetes with hemoglobin A1c goal below 6.5% and lipids with LDL cholesterol goal below 70 mg/dL.  - recheck CBC next visit to make sure no worsening anemia on ASA - follow up in 3 months

## 2016-12-13 DIAGNOSIS — N2581 Secondary hyperparathyroidism of renal origin: Secondary | ICD-10-CM | POA: Diagnosis not present

## 2016-12-13 DIAGNOSIS — N186 End stage renal disease: Secondary | ICD-10-CM | POA: Diagnosis not present

## 2016-12-13 LAB — CULTURE, BLOOD (ROUTINE X 2)
CULTURE: NO GROWTH
Culture: NO GROWTH

## 2016-12-15 DIAGNOSIS — N186 End stage renal disease: Secondary | ICD-10-CM | POA: Diagnosis not present

## 2016-12-15 DIAGNOSIS — N2581 Secondary hyperparathyroidism of renal origin: Secondary | ICD-10-CM | POA: Diagnosis not present

## 2016-12-18 DIAGNOSIS — N186 End stage renal disease: Secondary | ICD-10-CM | POA: Diagnosis not present

## 2016-12-18 DIAGNOSIS — N2581 Secondary hyperparathyroidism of renal origin: Secondary | ICD-10-CM | POA: Diagnosis not present

## 2016-12-19 ENCOUNTER — Encounter: Payer: Self-pay | Admitting: Internal Medicine

## 2016-12-19 ENCOUNTER — Ambulatory Visit (INDEPENDENT_AMBULATORY_CARE_PROVIDER_SITE_OTHER): Payer: Medicare Other | Admitting: Internal Medicine

## 2016-12-19 DIAGNOSIS — N186 End stage renal disease: Secondary | ICD-10-CM | POA: Diagnosis not present

## 2016-12-19 DIAGNOSIS — I63311 Cerebral infarction due to thrombosis of right middle cerebral artery: Secondary | ICD-10-CM | POA: Diagnosis not present

## 2016-12-19 DIAGNOSIS — J101 Influenza due to other identified influenza virus with other respiratory manifestations: Secondary | ICD-10-CM

## 2016-12-19 DIAGNOSIS — M1030 Gout due to renal impairment, unspecified site: Secondary | ICD-10-CM

## 2016-12-19 DIAGNOSIS — Z992 Dependence on renal dialysis: Secondary | ICD-10-CM

## 2016-12-19 MED ORDER — HYDROCODONE-HOMATROPINE 5-1.5 MG/5ML PO SYRP: 5.0000 mL | ORAL_SOLUTION | Freq: Three times a day (TID) | ORAL | 0 refills | Status: DC | PRN

## 2016-12-19 NOTE — Progress Notes (Signed)
Pre visit review using our clinic review tool, if applicable. No additional management support is needed unless otherwise documented below in the visit note. 

## 2016-12-19 NOTE — Patient Instructions (Addendum)
We have given you the cough medicine and you can take it at night time.

## 2016-12-19 NOTE — Assessment & Plan Note (Signed)
No flare since leaving the hospital and takes allopurinol daily.

## 2016-12-19 NOTE — Progress Notes (Addendum)
   Subjective:    Patient ID: [name], male    DOB: January 26, 1929, 83 y.o.   MRN: [id number]  HPI The patient is an 83 YO man coming in for hospital follow up (in for flu A with encephalopathy). Treated with tamiflu and had some improvement possibly to his baseline with mental status while in the hospital. He has finished his tamiflu since leaving the hospital. Is ESRD and does MWF dialysis and denies missing sessions. He is having mild cough in the evening which does keep him from sleeping sometimes. Denies fevers or chills. No SOB. No nasal congestion or drainage.   PMH, [facility], social history reviewed and updated.   Review of Systems  Constitutional: Negative for activity change, appetite change, diaphoresis, fatigue, fever and unexpected weight change.  HENT: Negative.   Eyes: Negative.   Respiratory: Positive for cough. Negative for choking, chest tightness, shortness of breath and wheezing.   Cardiovascular: Negative.   Gastrointestinal: Negative.   Musculoskeletal: Negative.   Skin: Negative.   Neurological: Negative.   Psychiatric/Behavioral: Negative.       Objective:   Physical Exam  Constitutional: He is oriented to person, place, and time. He appears well-developed and well-nourished.  HENT:  Head: Normocephalic and atraumatic.  Right Ear: External ear normal.  Left Ear: External ear normal.  Nose: Nose normal.  Mouth/Throat: Oropharynx is clear and moist.  Eyes: EOM are normal.  Neck: Normal range of motion. No JVD present.  Cardiovascular: Normal rate and regular rhythm.   Pulmonary/Chest: Effort normal and breath sounds normal. No respiratory distress. He has no wheezes. He has no rales.  Abdominal: Soft.  Musculoskeletal: He exhibits no edema.  Lymphadenopathy:    He has no cervical adenopathy.  Neurological: He is alert and oriented to person, place, and time. Coordination normal.  Skin: Skin is warm and dry.   Vitals:   12/19/16 1007  BP: 124/60  Pulse:  82  Temp: 97.7 F (36.5 C)  TempSrc: Oral  SpO2: 98%  Weight: 149 lb (67.6 kg)  Height: 5\' 9"  (1.753 m)      Assessment & Plan:

## 2016-12-19 NOTE — Assessment & Plan Note (Signed)
Finished tamiflu, still with evening cough disrupting sleep. Rx for hycodan to help with cough. He is recovering well and mental status intact from baseline per his wife.

## 2016-12-19 NOTE — Assessment & Plan Note (Signed)
Stable and doing MWF dialysis without missing sessions.

## 2016-12-20 DIAGNOSIS — N186 End stage renal disease: Secondary | ICD-10-CM | POA: Diagnosis not present

## 2016-12-20 DIAGNOSIS — N2581 Secondary hyperparathyroidism of renal origin: Secondary | ICD-10-CM | POA: Diagnosis not present

## 2016-12-22 DIAGNOSIS — N2581 Secondary hyperparathyroidism of renal origin: Secondary | ICD-10-CM | POA: Diagnosis not present

## 2016-12-22 DIAGNOSIS — N186 End stage renal disease: Secondary | ICD-10-CM | POA: Diagnosis not present

## 2016-12-25 DIAGNOSIS — N186 End stage renal disease: Secondary | ICD-10-CM | POA: Diagnosis not present

## 2016-12-25 DIAGNOSIS — N2581 Secondary hyperparathyroidism of renal origin: Secondary | ICD-10-CM | POA: Diagnosis not present

## 2016-12-27 DIAGNOSIS — N2581 Secondary hyperparathyroidism of renal origin: Secondary | ICD-10-CM | POA: Diagnosis not present

## 2016-12-27 DIAGNOSIS — N186 End stage renal disease: Secondary | ICD-10-CM | POA: Diagnosis not present

## 2016-12-29 DIAGNOSIS — N186 End stage renal disease: Secondary | ICD-10-CM | POA: Diagnosis not present

## 2016-12-29 DIAGNOSIS — N2581 Secondary hyperparathyroidism of renal origin: Secondary | ICD-10-CM | POA: Diagnosis not present

## 2017-01-01 DIAGNOSIS — N186 End stage renal disease: Secondary | ICD-10-CM | POA: Diagnosis not present

## 2017-01-01 DIAGNOSIS — N2581 Secondary hyperparathyroidism of renal origin: Secondary | ICD-10-CM | POA: Diagnosis not present

## 2017-01-03 DIAGNOSIS — N2581 Secondary hyperparathyroidism of renal origin: Secondary | ICD-10-CM | POA: Diagnosis not present

## 2017-01-03 DIAGNOSIS — N186 End stage renal disease: Secondary | ICD-10-CM | POA: Diagnosis not present

## 2017-01-03 DIAGNOSIS — Z992 Dependence on renal dialysis: Secondary | ICD-10-CM | POA: Diagnosis not present

## 2017-01-03 DIAGNOSIS — I158 Other secondary hypertension: Secondary | ICD-10-CM | POA: Diagnosis not present

## 2017-01-04 DIAGNOSIS — N186 End stage renal disease: Secondary | ICD-10-CM | POA: Diagnosis not present

## 2017-01-04 DIAGNOSIS — I871 Compression of vein: Secondary | ICD-10-CM | POA: Diagnosis not present

## 2017-01-04 DIAGNOSIS — Z992 Dependence on renal dialysis: Secondary | ICD-10-CM | POA: Diagnosis not present

## 2017-01-04 DIAGNOSIS — R2232 Localized swelling, mass and lump, left upper limb: Secondary | ICD-10-CM | POA: Diagnosis not present

## 2017-01-05 DIAGNOSIS — N186 End stage renal disease: Secondary | ICD-10-CM | POA: Diagnosis not present

## 2017-01-05 DIAGNOSIS — N2581 Secondary hyperparathyroidism of renal origin: Secondary | ICD-10-CM | POA: Diagnosis not present

## 2017-01-05 DIAGNOSIS — D631 Anemia in chronic kidney disease: Secondary | ICD-10-CM | POA: Diagnosis not present

## 2017-01-08 DIAGNOSIS — N186 End stage renal disease: Secondary | ICD-10-CM | POA: Diagnosis not present

## 2017-01-08 DIAGNOSIS — N2581 Secondary hyperparathyroidism of renal origin: Secondary | ICD-10-CM | POA: Diagnosis not present

## 2017-01-08 DIAGNOSIS — D631 Anemia in chronic kidney disease: Secondary | ICD-10-CM | POA: Diagnosis not present

## 2017-01-10 DIAGNOSIS — D631 Anemia in chronic kidney disease: Secondary | ICD-10-CM | POA: Diagnosis not present

## 2017-01-10 DIAGNOSIS — N2581 Secondary hyperparathyroidism of renal origin: Secondary | ICD-10-CM | POA: Diagnosis not present

## 2017-01-10 DIAGNOSIS — N186 End stage renal disease: Secondary | ICD-10-CM | POA: Diagnosis not present

## 2017-01-12 DIAGNOSIS — D631 Anemia in chronic kidney disease: Secondary | ICD-10-CM | POA: Diagnosis not present

## 2017-01-12 DIAGNOSIS — N186 End stage renal disease: Secondary | ICD-10-CM | POA: Diagnosis not present

## 2017-01-12 DIAGNOSIS — N2581 Secondary hyperparathyroidism of renal origin: Secondary | ICD-10-CM | POA: Diagnosis not present

## 2017-01-15 DIAGNOSIS — D631 Anemia in chronic kidney disease: Secondary | ICD-10-CM | POA: Diagnosis not present

## 2017-01-15 DIAGNOSIS — N2581 Secondary hyperparathyroidism of renal origin: Secondary | ICD-10-CM | POA: Diagnosis not present

## 2017-01-15 DIAGNOSIS — N186 End stage renal disease: Secondary | ICD-10-CM | POA: Diagnosis not present

## 2017-01-17 DIAGNOSIS — G540 Brachial plexus disorders: Secondary | ICD-10-CM | POA: Diagnosis not present

## 2017-01-17 DIAGNOSIS — N186 End stage renal disease: Secondary | ICD-10-CM | POA: Diagnosis not present

## 2017-01-18 DIAGNOSIS — N2581 Secondary hyperparathyroidism of renal origin: Secondary | ICD-10-CM | POA: Diagnosis not present

## 2017-01-18 DIAGNOSIS — N186 End stage renal disease: Secondary | ICD-10-CM | POA: Diagnosis not present

## 2017-01-18 DIAGNOSIS — D631 Anemia in chronic kidney disease: Secondary | ICD-10-CM | POA: Diagnosis not present

## 2017-01-19 DIAGNOSIS — N2581 Secondary hyperparathyroidism of renal origin: Secondary | ICD-10-CM | POA: Diagnosis not present

## 2017-01-19 DIAGNOSIS — D631 Anemia in chronic kidney disease: Secondary | ICD-10-CM | POA: Diagnosis not present

## 2017-01-19 DIAGNOSIS — N186 End stage renal disease: Secondary | ICD-10-CM | POA: Diagnosis not present

## 2017-01-22 DIAGNOSIS — D631 Anemia in chronic kidney disease: Secondary | ICD-10-CM | POA: Diagnosis not present

## 2017-01-22 DIAGNOSIS — N2581 Secondary hyperparathyroidism of renal origin: Secondary | ICD-10-CM | POA: Diagnosis not present

## 2017-01-22 DIAGNOSIS — N186 End stage renal disease: Secondary | ICD-10-CM | POA: Diagnosis not present

## 2017-01-24 DIAGNOSIS — D631 Anemia in chronic kidney disease: Secondary | ICD-10-CM | POA: Diagnosis not present

## 2017-01-24 DIAGNOSIS — N186 End stage renal disease: Secondary | ICD-10-CM | POA: Diagnosis not present

## 2017-01-24 DIAGNOSIS — N2581 Secondary hyperparathyroidism of renal origin: Secondary | ICD-10-CM | POA: Diagnosis not present

## 2017-01-26 DIAGNOSIS — N2581 Secondary hyperparathyroidism of renal origin: Secondary | ICD-10-CM | POA: Diagnosis not present

## 2017-01-26 DIAGNOSIS — N186 End stage renal disease: Secondary | ICD-10-CM | POA: Diagnosis not present

## 2017-01-26 DIAGNOSIS — D631 Anemia in chronic kidney disease: Secondary | ICD-10-CM | POA: Diagnosis not present

## 2017-01-29 DIAGNOSIS — N2581 Secondary hyperparathyroidism of renal origin: Secondary | ICD-10-CM | POA: Diagnosis not present

## 2017-01-29 DIAGNOSIS — N186 End stage renal disease: Secondary | ICD-10-CM | POA: Diagnosis not present

## 2017-01-29 DIAGNOSIS — D631 Anemia in chronic kidney disease: Secondary | ICD-10-CM | POA: Diagnosis not present

## 2017-01-31 DIAGNOSIS — N186 End stage renal disease: Secondary | ICD-10-CM | POA: Diagnosis not present

## 2017-01-31 DIAGNOSIS — D631 Anemia in chronic kidney disease: Secondary | ICD-10-CM | POA: Diagnosis not present

## 2017-01-31 DIAGNOSIS — N2581 Secondary hyperparathyroidism of renal origin: Secondary | ICD-10-CM | POA: Diagnosis not present

## 2017-02-02 DIAGNOSIS — D631 Anemia in chronic kidney disease: Secondary | ICD-10-CM | POA: Diagnosis not present

## 2017-02-02 DIAGNOSIS — N186 End stage renal disease: Secondary | ICD-10-CM | POA: Diagnosis not present

## 2017-02-02 DIAGNOSIS — N2581 Secondary hyperparathyroidism of renal origin: Secondary | ICD-10-CM | POA: Diagnosis not present

## 2017-02-03 DIAGNOSIS — N186 End stage renal disease: Secondary | ICD-10-CM | POA: Diagnosis not present

## 2017-02-03 DIAGNOSIS — I158 Other secondary hypertension: Secondary | ICD-10-CM | POA: Diagnosis not present

## 2017-02-03 DIAGNOSIS — Z992 Dependence on renal dialysis: Secondary | ICD-10-CM | POA: Diagnosis not present

## 2017-02-05 DIAGNOSIS — N186 End stage renal disease: Secondary | ICD-10-CM | POA: Diagnosis not present

## 2017-02-05 DIAGNOSIS — N2581 Secondary hyperparathyroidism of renal origin: Secondary | ICD-10-CM | POA: Diagnosis not present

## 2017-02-05 DIAGNOSIS — D631 Anemia in chronic kidney disease: Secondary | ICD-10-CM | POA: Diagnosis not present

## 2017-02-07 DIAGNOSIS — D631 Anemia in chronic kidney disease: Secondary | ICD-10-CM | POA: Diagnosis not present

## 2017-02-07 DIAGNOSIS — N2581 Secondary hyperparathyroidism of renal origin: Secondary | ICD-10-CM | POA: Diagnosis not present

## 2017-02-07 DIAGNOSIS — N186 End stage renal disease: Secondary | ICD-10-CM | POA: Diagnosis not present

## 2017-02-09 DIAGNOSIS — N2581 Secondary hyperparathyroidism of renal origin: Secondary | ICD-10-CM | POA: Diagnosis not present

## 2017-02-09 DIAGNOSIS — N186 End stage renal disease: Secondary | ICD-10-CM | POA: Diagnosis not present

## 2017-02-09 DIAGNOSIS — D631 Anemia in chronic kidney disease: Secondary | ICD-10-CM | POA: Diagnosis not present

## 2017-02-11 ENCOUNTER — Other Ambulatory Visit: Payer: Self-pay | Admitting: Internal Medicine

## 2017-02-12 DIAGNOSIS — D631 Anemia in chronic kidney disease: Secondary | ICD-10-CM | POA: Diagnosis not present

## 2017-02-12 DIAGNOSIS — N2581 Secondary hyperparathyroidism of renal origin: Secondary | ICD-10-CM | POA: Diagnosis not present

## 2017-02-12 DIAGNOSIS — N186 End stage renal disease: Secondary | ICD-10-CM | POA: Diagnosis not present

## 2017-02-14 DIAGNOSIS — D631 Anemia in chronic kidney disease: Secondary | ICD-10-CM | POA: Diagnosis not present

## 2017-02-14 DIAGNOSIS — N186 End stage renal disease: Secondary | ICD-10-CM | POA: Diagnosis not present

## 2017-02-14 DIAGNOSIS — N2581 Secondary hyperparathyroidism of renal origin: Secondary | ICD-10-CM | POA: Diagnosis not present

## 2017-02-15 ENCOUNTER — Ambulatory Visit (INDEPENDENT_AMBULATORY_CARE_PROVIDER_SITE_OTHER): Payer: Medicare Other

## 2017-02-15 ENCOUNTER — Encounter: Payer: Self-pay | Admitting: Podiatry

## 2017-02-15 ENCOUNTER — Ambulatory Visit (INDEPENDENT_AMBULATORY_CARE_PROVIDER_SITE_OTHER): Payer: Medicare Other | Admitting: Podiatry

## 2017-02-15 VITALS — BP 128/60 | HR 83 | Resp 16 | Ht 69.0 in | Wt 140.0 lb

## 2017-02-15 DIAGNOSIS — M21619 Bunion of unspecified foot: Secondary | ICD-10-CM | POA: Diagnosis not present

## 2017-02-15 DIAGNOSIS — I63311 Cerebral infarction due to thrombosis of right middle cerebral artery: Secondary | ICD-10-CM | POA: Diagnosis not present

## 2017-02-15 DIAGNOSIS — L84 Corns and callosities: Secondary | ICD-10-CM | POA: Diagnosis not present

## 2017-02-15 DIAGNOSIS — M79672 Pain in left foot: Secondary | ICD-10-CM

## 2017-02-15 DIAGNOSIS — M2040 Other hammer toe(s) (acquired), unspecified foot: Secondary | ICD-10-CM

## 2017-02-15 NOTE — Progress Notes (Signed)
   Subjective:    Patient ID: [name], male    DOB: [birth year], 83 y.o.   MRN: [id number]  HPI Chief Complaint  Patient presents with  . Painful lesion    Left foot; medial side-below great toe; pt stated, "has had for the past 2-3 weeks'      Review of Systems  All other systems reviewed and are negative.      Objective:   Physical Exam        Assessment & Plan:

## 2017-02-16 DIAGNOSIS — N2581 Secondary hyperparathyroidism of renal origin: Secondary | ICD-10-CM | POA: Diagnosis not present

## 2017-02-16 DIAGNOSIS — N186 End stage renal disease: Secondary | ICD-10-CM | POA: Diagnosis not present

## 2017-02-16 DIAGNOSIS — D631 Anemia in chronic kidney disease: Secondary | ICD-10-CM | POA: Diagnosis not present

## 2017-02-18 NOTE — Progress Notes (Signed)
Subjective:     Patient ID: [name], male   DOB: December 16, 1928, 83 y.o.   MRN: [id number]  HPI patient presents with very painful lesion medial side first metatarsal and also structural bunion deformity. Presents with caregiver   Review of Systems  All other systems reviewed and are negative.      Objective:   Physical Exam  Constitutional: He is oriented to person, place, and time.  Cardiovascular: Intact distal pulses.   Musculoskeletal: Normal range of motion.  Neurological: He is oriented to person, place, and time.  Skin: Skin is warm.  Nursing note and vitals reviewed.  neurovascular status is found to be moderately diminished with range of motion subtalar midtarsal joint diminished. Patient's found to have severe keratotic lesion around the medial side first metatarsal and left hallux with structural deformity as treating factor. Patient's found to have good digital perfusion and is well oriented 3     Assessment:     Significant structural malalignment causing increased pressure against the medial side of the hallux and first metatarsal    Plan:     H&P condition reviewed an aggressive debridement was accomplished. I advised on supportive-type shoes and cushion with the consideration for surgical intervention if symptoms got worse  X-rays indicate structural bunion deformity bilateral with deviation the hallux against the second toe and pressure around the first metatarsal second

## 2017-02-19 DIAGNOSIS — D631 Anemia in chronic kidney disease: Secondary | ICD-10-CM | POA: Diagnosis not present

## 2017-02-19 DIAGNOSIS — N2581 Secondary hyperparathyroidism of renal origin: Secondary | ICD-10-CM | POA: Diagnosis not present

## 2017-02-19 DIAGNOSIS — N186 End stage renal disease: Secondary | ICD-10-CM | POA: Diagnosis not present

## 2017-02-21 DIAGNOSIS — D631 Anemia in chronic kidney disease: Secondary | ICD-10-CM | POA: Diagnosis not present

## 2017-02-21 DIAGNOSIS — N2581 Secondary hyperparathyroidism of renal origin: Secondary | ICD-10-CM | POA: Diagnosis not present

## 2017-02-21 DIAGNOSIS — N186 End stage renal disease: Secondary | ICD-10-CM | POA: Diagnosis not present

## 2017-02-23 DIAGNOSIS — D631 Anemia in chronic kidney disease: Secondary | ICD-10-CM | POA: Diagnosis not present

## 2017-02-23 DIAGNOSIS — N2581 Secondary hyperparathyroidism of renal origin: Secondary | ICD-10-CM | POA: Diagnosis not present

## 2017-02-23 DIAGNOSIS — N186 End stage renal disease: Secondary | ICD-10-CM | POA: Diagnosis not present

## 2017-02-26 DIAGNOSIS — D631 Anemia in chronic kidney disease: Secondary | ICD-10-CM | POA: Diagnosis not present

## 2017-02-26 DIAGNOSIS — N186 End stage renal disease: Secondary | ICD-10-CM | POA: Diagnosis not present

## 2017-02-26 DIAGNOSIS — N2581 Secondary hyperparathyroidism of renal origin: Secondary | ICD-10-CM | POA: Diagnosis not present

## 2017-02-28 DIAGNOSIS — N2581 Secondary hyperparathyroidism of renal origin: Secondary | ICD-10-CM | POA: Diagnosis not present

## 2017-02-28 DIAGNOSIS — D631 Anemia in chronic kidney disease: Secondary | ICD-10-CM | POA: Diagnosis not present

## 2017-02-28 DIAGNOSIS — N186 End stage renal disease: Secondary | ICD-10-CM | POA: Diagnosis not present

## 2017-03-02 DIAGNOSIS — N186 End stage renal disease: Secondary | ICD-10-CM | POA: Diagnosis not present

## 2017-03-02 DIAGNOSIS — N2581 Secondary hyperparathyroidism of renal origin: Secondary | ICD-10-CM | POA: Diagnosis not present

## 2017-03-02 DIAGNOSIS — D631 Anemia in chronic kidney disease: Secondary | ICD-10-CM | POA: Diagnosis not present

## 2017-03-05 DIAGNOSIS — N186 End stage renal disease: Secondary | ICD-10-CM | POA: Diagnosis not present

## 2017-03-05 DIAGNOSIS — Z992 Dependence on renal dialysis: Secondary | ICD-10-CM | POA: Diagnosis not present

## 2017-03-05 DIAGNOSIS — N2581 Secondary hyperparathyroidism of renal origin: Secondary | ICD-10-CM | POA: Diagnosis not present

## 2017-03-05 DIAGNOSIS — I158 Other secondary hypertension: Secondary | ICD-10-CM | POA: Diagnosis not present

## 2017-03-05 DIAGNOSIS — D631 Anemia in chronic kidney disease: Secondary | ICD-10-CM | POA: Diagnosis not present

## 2017-03-06 DIAGNOSIS — Z992 Dependence on renal dialysis: Secondary | ICD-10-CM | POA: Diagnosis not present

## 2017-03-06 DIAGNOSIS — T82858A Stenosis of vascular prosthetic devices, implants and grafts, initial encounter: Secondary | ICD-10-CM | POA: Diagnosis not present

## 2017-03-06 DIAGNOSIS — N186 End stage renal disease: Secondary | ICD-10-CM | POA: Diagnosis not present

## 2017-03-06 DIAGNOSIS — I871 Compression of vein: Secondary | ICD-10-CM | POA: Diagnosis not present

## 2017-03-07 DIAGNOSIS — N2581 Secondary hyperparathyroidism of renal origin: Secondary | ICD-10-CM | POA: Diagnosis not present

## 2017-03-07 DIAGNOSIS — D509 Iron deficiency anemia, unspecified: Secondary | ICD-10-CM | POA: Diagnosis not present

## 2017-03-07 DIAGNOSIS — N186 End stage renal disease: Secondary | ICD-10-CM | POA: Diagnosis not present

## 2017-03-09 DIAGNOSIS — N186 End stage renal disease: Secondary | ICD-10-CM | POA: Diagnosis not present

## 2017-03-09 DIAGNOSIS — N2581 Secondary hyperparathyroidism of renal origin: Secondary | ICD-10-CM | POA: Diagnosis not present

## 2017-03-09 DIAGNOSIS — D509 Iron deficiency anemia, unspecified: Secondary | ICD-10-CM | POA: Diagnosis not present

## 2017-03-12 DIAGNOSIS — D509 Iron deficiency anemia, unspecified: Secondary | ICD-10-CM | POA: Diagnosis not present

## 2017-03-12 DIAGNOSIS — N186 End stage renal disease: Secondary | ICD-10-CM | POA: Diagnosis not present

## 2017-03-12 DIAGNOSIS — N2581 Secondary hyperparathyroidism of renal origin: Secondary | ICD-10-CM | POA: Diagnosis not present

## 2017-03-14 DIAGNOSIS — N186 End stage renal disease: Secondary | ICD-10-CM | POA: Diagnosis not present

## 2017-03-14 DIAGNOSIS — D509 Iron deficiency anemia, unspecified: Secondary | ICD-10-CM | POA: Diagnosis not present

## 2017-03-14 DIAGNOSIS — N2581 Secondary hyperparathyroidism of renal origin: Secondary | ICD-10-CM | POA: Diagnosis not present

## 2017-03-16 DIAGNOSIS — N186 End stage renal disease: Secondary | ICD-10-CM | POA: Diagnosis not present

## 2017-03-16 DIAGNOSIS — N2581 Secondary hyperparathyroidism of renal origin: Secondary | ICD-10-CM | POA: Diagnosis not present

## 2017-03-16 DIAGNOSIS — D509 Iron deficiency anemia, unspecified: Secondary | ICD-10-CM | POA: Diagnosis not present

## 2017-03-19 DIAGNOSIS — D509 Iron deficiency anemia, unspecified: Secondary | ICD-10-CM | POA: Diagnosis not present

## 2017-03-19 DIAGNOSIS — N186 End stage renal disease: Secondary | ICD-10-CM | POA: Diagnosis not present

## 2017-03-19 DIAGNOSIS — N2581 Secondary hyperparathyroidism of renal origin: Secondary | ICD-10-CM | POA: Diagnosis not present

## 2017-03-20 ENCOUNTER — Encounter (INDEPENDENT_AMBULATORY_CARE_PROVIDER_SITE_OTHER): Payer: Self-pay

## 2017-03-20 ENCOUNTER — Encounter: Payer: Self-pay | Admitting: Nurse Practitioner

## 2017-03-20 ENCOUNTER — Ambulatory Visit (INDEPENDENT_AMBULATORY_CARE_PROVIDER_SITE_OTHER): Payer: Medicare Other | Admitting: Nurse Practitioner

## 2017-03-20 VITALS — BP 104/44 | HR 80 | Ht 69.0 in | Wt 150.0 lb

## 2017-03-20 DIAGNOSIS — Z992 Dependence on renal dialysis: Secondary | ICD-10-CM

## 2017-03-20 DIAGNOSIS — N186 End stage renal disease: Secondary | ICD-10-CM

## 2017-03-20 DIAGNOSIS — E785 Hyperlipidemia, unspecified: Secondary | ICD-10-CM | POA: Diagnosis not present

## 2017-03-20 DIAGNOSIS — I63311 Cerebral infarction due to thrombosis of right middle cerebral artery: Secondary | ICD-10-CM | POA: Diagnosis not present

## 2017-03-20 NOTE — Progress Notes (Signed)
[facility]  PATIENT: [name] DOB: February 20, 1929   REASON FOR VISIT: Follow-up for history of stroke 8 2016. HISTORY FROM: Patient and wife    HISTORY OF PRESENT ILLNESS:[name] is a 83 y.o. male with history of ESRD on hemodialysis, colonic diverticulosis with LGIB requiring blood transfusion, hypertension and hyperlipidemia was admitted on 07/05/15 for left facial droop, weakness and fatigue after a brief episode of loss of consciousness. MRI showed left centrum semiovale subcortical infarct, likely due to small vessel disease. Stroke work up including TCD, TTE, CUS, venous doppler UE and LE, A1C all negative. LDL 98. Pt had several episodes of GIB with anemia requiring blood transfusion so that ASA was on hold initially. EGD/colonoscopy on 8/12 was negative, and Capsule Endoscopy negative as well. GI suspects a stuttering diverticular bleed. ASA was resumed after GI agreement. Pt was discharged with ASA and crestor.  03/07/16 follow up Dr. Erlinda Hong- he was doing well. He went to ER on 02/28/16 for episode of passing out. He had HD that day in the morning. In the evening, he was having shower, he bent down to get shower material and when he got up, he passed out and fall. Wife got him out of shower and put him in supine position, he gradually recovered. EMS checked his orthostatic vital showed difference of BPs and sent him to ER. He was conscious and no confusion or post ictal with EMS. In ER BP recovered and he was sent back home. As per wife, he is taking atenolol 25mg  on dialysis days and 50mg  other days. BP usually 100-110 at home. Today BP 112/55.  11/2/17Dr Xu follow up - pt has been doing well. Followed with cardiology for syncope management. Took off all BP meds and just had 30 day cardiac event monitoring, report pending. However, his BP still low. Today not his dialysis day BP 98/49. Wife said sometimes with dialysis BP down to 70s. Sometimes he stand up from  chair having pre-syncope like condition, staring off and imbalance. No other complains.  Interval History2/6/18 Dr. Erlinda Hong During the interval time, pt was admitted on 10/04/16 for anemia, found to have positive FOB, received 2 PRBC transfusion, ASA on hold. Followed up with PCP on 10/12/16 and considered anemia most likely due to ESRD as he had extensive GI work up in the past, only showed diverticulosis. However, ASA still on hold. 12/09/15 pt had fever, confusion and admitted to [facility] for flu, discharged with tamiflu several days ago. Currently, doing well. On midodrine and BP 124/59.  UPDATE 03/20/2017 CMMr. Wise, 83 year old male returns for follow-up with history of stroke August 2016. Admission for anemia in November of last year and aspirin was held. This was restarted February by Dr. Erlinda Hong. He has minimal bruising and no bleeding. He has not had further stroke or TIA symptoms. According to the wife he gets his hemoglobin checked weekly at dialysis clinic. He goes Monday Wednesdays and Fridays. Blood pressure in the office today 106/64. Patient did take his midodrine  this morning. Wife checks his blood pressure 3 times a day. His anemia is probably due to end-stage renal disease extensive GI workup in the past. He does little exercise but used to bowl. He is on Crestor for hyperlipidemia without myalgias. He returns for reevaluation  REVIEW OF SYSTEMS: Full 14 system review of systems performed and notable only for those listed, all others are neg:  Constitutional: neg  Cardiovascular: neg Ear/Nose/Throat: Hearing loss Skin: neg Eyes:  neg Respiratory: neg Gastroitestinal: neg  Hematology/Lymphatic: neg  Endocrine: neg Musculoskeletal: Back pain  Allergy/Immunology: neg Neurological: neg Psychiatric: neg Sleep : neg   ALLERGIES: No Known Allergies  HOME MEDICATIONS: Outpatient Medications Prior to Visit  Medication Sig Dispense Refill  . allopurinol (ZYLOPRIM) 100 MG tablet TAKE 1 TABLET  DAILY 90 tablet 3  . aspirin EC 81 MG tablet Take 1 tablet (81 mg total) by mouth daily.    . calcitRIOL (ROCALTROL) 0.5 MCG capsule Take 2 capsules (1 mcg total) by mouth every Monday, Wednesday, and Friday with hemodialysis. 25 capsule 0  . citalopram (CELEXA) 10 MG tablet Take 1 tablet (10 mg total) by mouth daily. 90 tablet 3  . docusate sodium (COLACE) 100 MG capsule Take 2 capsules (200 mg total) by mouth 2 (two) times daily. 60 capsule 0  . doxercalciferol (HECTOROL) 0.5 MCG capsule Take 1.5 mcg by mouth 3 (three) times a week. MWF    . midodrine (PROAMATINE) 5 MG tablet Take 1 tablet (5 mg total) by mouth 3 (three) times daily. Take in the morning, lunch time and 4pm. Do not take after 4pm. (Patient taking differently: Take 5 mg by mouth 3 (three) times daily as needed (low blood pressure). Take in the morning, lunch time and 4pm. Do not take after 4pm.) 90 tablet 3  . Multiple Vitamin (MULTIVITAMIN WITH MINERALS) TABS tablet Take 1 tablet by mouth daily.    Marland Kitchen nystatin (MYCOSTATIN/NYSTOP) powder Use as directed twice per day (Patient taking differently: Apply 1 g topically 2 (two) times daily as needed (rash). ) 45 g 1  . rosuvastatin (CRESTOR) 20 MG tablet TAKE 1 TABLET AT BEDTIME 90 tablet 3   No facility-administered medications prior to visit.     PAST MEDICAL HISTORY: Past Medical History:  Diagnosis Date  . ANEMIA-IRON DEFICIENCY 03/09/2008  . Arthritis    cervical spine.   . Complex renal cyst 06/19/2011  . Depression 03/26/2014  . DIVERTICULOSIS, COLON 03/09/2008  . ESRD (end stage renal disease) ([place]) 03/09/2008  . Flu 12/08/2016  . GOUT 03/09/2008  . Hemorrhoids 08/2009   internal.   . History of blood transfusion   . HYPERLIPIDEMIA 03/09/2008  . Hyperparathyroidism ([place])   . HYPERTENSION 03/09/2008  . Prostate cancer [facility]) 2002   Completed external beam radiation 2003.per HPI  . PROSTATE CANCER, HX OF 03/09/2008  . Radiation cystitis 2010.  Marland Kitchen Stroke [facility])     PAST SURGICAL  HISTORY: Past Surgical History:  Procedure Laterality Date  . AV FISTULA PLACEMENT Left 01/24/2013   Procedure: INSERTION OF ARTERIOVENOUS (AV) GORE-TEX GRAFT ARM;  Surgeon: Rosetta Posner, MD;  Location: [place];  Service: Vascular;  Laterality: Left;  . COLONOSCOPY N/A 06/17/2015   Procedure: COLONOSCOPY;  Surgeon: Gatha Mayer, MD;  Location: [place];  Service: Endoscopy;  Laterality: N/A;  . ESOPHAGOGASTRODUODENOSCOPY N/A 06/17/2015   Procedure: ESOPHAGOGASTRODUODENOSCOPY (EGD);  Surgeon: Gatha Mayer, MD;  Location[facility] ENDOSCOPY;  Service: Endoscopy;  Laterality: N/A;  . FLEXIBLE SIGMOIDOSCOPY N/A 06/15/2015   Procedure: FLEXIBLE SIGMOIDOSCOPY;  Surgeon: Gatha Mayer, MD;  Location: [place];  Service: Endoscopy;  Laterality: N/A;  . GIVENS CAPSULE STUDY N/A 07/06/2015   Procedure: GIVENS CAPSULE STUDY;  Surgeon: Milus Banister, MD;  Location: [place];  Service: Endoscopy;  Laterality: N/A;  . JOINT REPLACEMENT     HIP  . rotator cuff repair right  4/08  . s/p left hip replacement  2007   Dr. Percell Miller ortho  .  TONSILLECTOMY      FAMILY HISTORY: Family History  Problem Relation Age of Onset  . Hypertension Father   . Diabetes Father   . Cancer Father        prostate cancer  . Hypertension Brother   . Cancer Brother     SOCIAL HISTORY: Social History   Social History  . Marital status: Married    Spouse name: N/A  . Number of children: 2  . Years of education: N/A   Occupational History  . retired Print production planner    Social History Main Topics  . Smoking status: Former Smoker    Years: 5.00    Types: Cigarettes    Quit date: 09/17/1982  . Smokeless tobacco: Never Used  . Alcohol use No     Comment: rare  . Drug use: No  . Sexual activity: Not on file   Other Topics Concern  . Not on file   Social History Narrative  . No narrative on file     PHYSICAL EXAM  Vitals:   03/20/17 1055  BP: (!) 104/44  Pulse: 80  Weight: 150 lb (68 kg)    Height: 5\' 9"  (1.753 m)   Body mass index is 22.15 kg/m.  Generalized: Well developed, in no acute distress  Head: normocephalic and atraumatic,. Oropharynx benign  Neck: Supple, no carotid bruits  Cardiac: Regular rate rhythm, no murmur  Musculoskeletal: No deformity   Neurological examination   Mentation: Alert oriented to time, place, history taking. Attention span and concentration appropriate.   Follows all commands speech and language fluent.   Cranial nerve II-XII: Pupils were equal round reactive to light extraocular movements were full, visual field were full on confrontational test. Facial sensation and strength were normal. hearing was intact to finger rubbing bilaterally. Uvula tongue midline. head turning and shoulder shrug were normal and symmetric.Tongue protrusion into cheek strength was normal. Motor: normal bulk and tone, full strength in the BUE, BLE, fine finger movements normal, no pronator drift. No focal weakness Sensory: normal and symmetric to light touch, in the upper and lower extremities  Coordination: finger-nose-finger, heel-to-shin bilaterally, no dysmetria, no tremor Reflexes: 1+ upper lower and symmetric, plantar responses were flexor bilaterally. Gait and Station: Rising up from seated position without assistance, normal stance,  moderate stride, no difficulty with turns  DIAGNOSTIC DATA (LABS, IMAGING, TESTING) - I reviewed patient records, labs, notes, testing and imaging myself where available.  Lab Results  Component Value Date   WBC 3.4 (L) 12/09/2016   HGB 12.1 (L) 12/09/2016   HCT 38.0 (L) 12/09/2016   MCV 89.2 12/09/2016   PLT 152 12/09/2016      Component Value Date/Time   NA 133 (L) 12/09/2016 0441   K 4.5 12/09/2016 0441   CL 97 (L) 12/09/2016 0441   CO2 27 12/09/2016 0441   GLUCOSE 86 12/09/2016 0441   BUN 25 (H) 12/09/2016 0441   CREATININE 7.07 (H) 12/09/2016 0441   CALCIUM 8.7 (L) 12/09/2016 0441   PROT 6.5 12/08/2016 0301    ALBUMIN 3.1 (L) 12/08/2016 1102   AST 22 12/08/2016 0301   ALT 13 (L) 12/08/2016 0301   ALKPHOS 50 12/08/2016 0301   BILITOT 0.6 12/08/2016 0301   GFRNONAA 6 (L) 12/09/2016 0441   GFRAA 7 (L) 12/09/2016 0441   Lab Results  Component Value Date   CHOL 188 07/05/2015   HDL 65 07/05/2015   LDLCALC 98 07/05/2015   TRIG 123 07/05/2015   CHOLHDL 2.9  07/05/2015   Lab Results  Component Value Date   HGBA1C 4.9 07/05/2015    ASSESSMENT AND PLAN [name] 83 yr old admitted on 07/05/15 for left centrum semiovale subcortical infarct, likely due to small vessel disease. Stroke work up including TCD, TTE, CUS, venous doppler UE and LE, A1C all negative. LDL 98. Pt had GIB with anemia requiring blood transfusion so that ASA was on hold initially. GI work up negative and ASA was resumed after GI agreement. Pt was discharged with ASA and crestor. During the interval time, no GIB. On ASA without issue. BP at low side but asymptomatic. However, he had syncope episode on 02/28/16. BP borderline low and positive orthostatic. No seizure or stroke. He is off all BP meds now, but BP still low, even lower on dialysis days, put on midodrine, BP much improved. 30 day cardiac monitoring no afib. However, had admission 09/2016 for anemia required for PRBC. ASA on hold again. Anemia more likely due to ESRD instead of GIB. Currently Hb 12.1, will recommend resume ASA EC 81mg  and close monitoring. Marland Kitchen He is currently getting hemoglobin checked weekly at dialysis clinic     Discussed with Dr. Erlinda Hong PLAN: Management of stroke risk factors Continue ASA EC 81mg  for stroke prevention as GI has cleared pt for GI cause of anemia.  Continue follow up with kidney doctor dialysis 3 times a week Continue crestor for stroke prevention, hyperlipidemia. Labs followed by primary care Continue midodrine 5mg  three times a day, take in the morning, lunch time and 4pm. Do not take after 4pm.  - BP goal 110-130 - Follow up with your  primary care physician for stroke risk factor modification. Recommend maintain blood pressure goal 110-130/80, diabetes with hemoglobin A1c goal below 6.5% and lipids with LDL cholesterol goal below 70 mg/dL.  -Hemoglobin is checked weekly at dialysis clinic Try to exercise slowly at first walking bowling etc Will discharge from stroke clinic  I spent more than 25 minutes of face to face time with the patient. Greater than 50% of time was spent in counseling and coordination of care. We have discussed about BP management, ASA use, and close monitor GIB. We also discussed management of stroke risk factors and handout was given Also encouraged patient to exercise some and get back to  activities that he enjoyed in the past Dennie Bible, [facility], [facility], [facility] [address], [place], [place] [address] [phone]

## 2017-03-20 NOTE — Patient Instructions (Addendum)
Management of stroke risk factors Continue ASA EC 81mg  for stroke prevention as GI has cleared pt for GI cause of anemia.  Continue follow up with kidney doctor in dialysis 3 times a week Continue crestor for stroke prevention, hyperlipidemia. Labs followed by primary care Continue midodrine 5mg  three times a day, take in the morning, lunch time and 4pm. Do not take after 4pm. After current RX runs out obtain from PCP - BP goal 110-130 - Follow up with your primary care physician for stroke risk factor modification. Recommend maintain blood pressure goal 110-130/80, diabetes with hemoglobin A1c goal below 6.5% and lipids with LDL cholesterol goal below 70 mg/dL.  -Hemoglobin is checked weekly at dialysis clinic Try to exercise slowly at first walking bowling etc Will discharge from stroke clinic   Stroke Prevention Some medical conditions and behaviors are associated with an increased chance of having a stroke. You may prevent a stroke by making healthy choices and managing medical conditions. How can I reduce my risk of having a stroke?  Stay physically active. Get at least 30 minutes of activity on most or all days.  Do not smoke. It may also be helpful to avoid exposure to secondhand smoke.  Limit alcohol use. Moderate alcohol use is considered to be:  No more than 2 drinks per day for men.  No more than 1 drink per day for nonpregnant women.  Eat healthy foods. This involves:  Eating 5 or more servings of fruits and vegetables a day.  Making dietary changes that address high blood pressure (hypertension), high cholesterol, diabetes, or obesity.  Manage your cholesterol levels.  Making food choices that are high in fiber and low in saturated fat, trans fat, and cholesterol may control cholesterol levels.  Take any prescribed medicines to control cholesterol as directed by your health care provider.  Manage your diabetes.  Controlling your carbohydrate and sugar intake is  recommended to manage diabetes.  Take any prescribed medicines to control diabetes as directed by your health care provider.  Control your hypertension.  Making food choices that are low in salt (sodium), saturated fat, trans fat, and cholesterol is recommended to manage hypertension.  Ask your health care provider if you need treatment to lower your blood pressure. Take any prescribed medicines to control hypertension as directed by your health care provider.  If you are 90-48 years of age, have your blood pressure checked every 3-5 years. If you are 24 years of age or older, have your blood pressure checked every year.  Maintain a healthy weight.  Reducing calorie intake and making food choices that are low in sodium, saturated fat, trans fat, and cholesterol are recommended to manage weight.  Stop drug abuse.  Avoid taking birth control pills.  Talk to your health care provider about the risks of taking birth control pills if you are over 61 years old, smoke, get migraines, or have ever had a blood clot.  Get evaluated for sleep disorders (sleep apnea).  Talk to your health care provider about getting a sleep evaluation if you snore a lot or have excessive sleepiness.  Take medicines only as directed by your health care provider.  For some people, aspirin or blood thinners (anticoagulants) are helpful in reducing the risk of forming abnormal blood clots that can lead to stroke. If you have the irregular heart rhythm of atrial fibrillation, you should be on a blood thinner unless there is a good reason you cannot take them.  Understand all your  medicine instructions.  Make sure that other conditions (such as anemia or atherosclerosis) are addressed. Get help right away if:  You have sudden weakness or numbness of the face, arm, or leg, especially on one side of the body.  Your face or eyelid droops to one side.  You have sudden confusion.  You have trouble speaking  (aphasia) or understanding.  You have sudden trouble seeing in one or both eyes.  You have sudden trouble walking.  You have dizziness.  You have a loss of balance or coordination.  You have a sudden, severe headache with no known cause.  You have new chest pain or an irregular heartbeat. Any of these symptoms may represent a serious problem that is an emergency. Do not wait to see if the symptoms will go away. Get medical help at once. Call your local emergency services (911 in [place] Do not drive yourself to the hospital. This information is not intended to replace advice given to you by your health care provider. Make sure you discuss any questions you have with your health care provider. Document Released: 11/30/2004 Document Revised: 03/30/2016 Document Reviewed: 04/25/2013 Elsevier Interactive Patient Education  2017 [employer].

## 2017-03-20 NOTE — Progress Notes (Signed)
I reviewed above note and agree with the assessment and plan.  Rosalin Hawking, MD PhD Stroke Neurology 03/20/2017 7:07 PM

## 2017-03-21 ENCOUNTER — Telehealth: Payer: Self-pay | Admitting: Nurse Practitioner

## 2017-03-21 DIAGNOSIS — N186 End stage renal disease: Secondary | ICD-10-CM | POA: Diagnosis not present

## 2017-03-21 DIAGNOSIS — D509 Iron deficiency anemia, unspecified: Secondary | ICD-10-CM | POA: Diagnosis not present

## 2017-03-21 DIAGNOSIS — N2581 Secondary hyperparathyroidism of renal origin: Secondary | ICD-10-CM | POA: Diagnosis not present

## 2017-03-21 NOTE — Telephone Encounter (Signed)
Pt's wife called said his hemoglobin was 12.2 on 03/14/17. She said NP requested the information

## 2017-03-21 NOTE — Telephone Encounter (Signed)
Recent hemoglobin 12.2 03/14/2017 at the dialysis clinic

## 2017-03-23 DIAGNOSIS — N186 End stage renal disease: Secondary | ICD-10-CM | POA: Diagnosis not present

## 2017-03-23 DIAGNOSIS — D509 Iron deficiency anemia, unspecified: Secondary | ICD-10-CM | POA: Diagnosis not present

## 2017-03-23 DIAGNOSIS — N2581 Secondary hyperparathyroidism of renal origin: Secondary | ICD-10-CM | POA: Diagnosis not present

## 2017-03-26 DIAGNOSIS — D509 Iron deficiency anemia, unspecified: Secondary | ICD-10-CM | POA: Diagnosis not present

## 2017-03-26 DIAGNOSIS — N186 End stage renal disease: Secondary | ICD-10-CM | POA: Diagnosis not present

## 2017-03-26 DIAGNOSIS — N2581 Secondary hyperparathyroidism of renal origin: Secondary | ICD-10-CM | POA: Diagnosis not present

## 2017-03-27 ENCOUNTER — Other Ambulatory Visit (INDEPENDENT_AMBULATORY_CARE_PROVIDER_SITE_OTHER): Payer: Medicare Other

## 2017-03-27 ENCOUNTER — Encounter: Payer: Self-pay | Admitting: Internal Medicine

## 2017-03-27 ENCOUNTER — Ambulatory Visit (INDEPENDENT_AMBULATORY_CARE_PROVIDER_SITE_OTHER): Payer: Medicare Other | Admitting: Internal Medicine

## 2017-03-27 VITALS — BP 140/72 | HR 81 | Ht 69.0 in | Wt 150.0 lb

## 2017-03-27 DIAGNOSIS — E785 Hyperlipidemia, unspecified: Secondary | ICD-10-CM

## 2017-03-27 DIAGNOSIS — I63311 Cerebral infarction due to thrombosis of right middle cerebral artery: Secondary | ICD-10-CM | POA: Diagnosis not present

## 2017-03-27 DIAGNOSIS — I10 Essential (primary) hypertension: Secondary | ICD-10-CM | POA: Diagnosis not present

## 2017-03-27 NOTE — Progress Notes (Signed)
Subjective:    Patient ID: [name], male    DOB: 1929-09-03, 83 y.o.   MRN: [id number]  HPI  Here to f/u with family to help with hx; overall doing ok,  Pt denies chest pain, increasing sob or doe, wheezing, orthopnea, PND, increased LE swelling, palpitations, dizziness or syncope.  Pt denies new neurological symptoms such as new headache, or facial or extremity weakness or numbness.  Pt denies polydipsia, polyuria, or low sugar episode.   Pt denies new neurological symptoms such as new headache, or facial or extremity weakness or numbness.   Pt states overall good compliance with meds, mostly trying to follow appropriate diet, with wt overall stable,  but little exercise however.  Has HD m-w-f.  No new complaints. Past Medical History:  Diagnosis Date  . ANEMIA-IRON DEFICIENCY 03/09/2008  . Arthritis    cervical spine.   . Complex renal cyst 06/19/2011  . Depression 03/26/2014  . DIVERTICULOSIS, COLON 03/09/2008  . ESRD (end stage renal disease) ([place]) 03/09/2008  . Flu 12/08/2016  . GOUT 03/09/2008  . Hemorrhoids 08/2009   internal.   . History of blood transfusion   . HYPERLIPIDEMIA 03/09/2008  . Hyperparathyroidism ([place])   . HYPERTENSION 03/09/2008  . Prostate cancer [facility]) 2002   Completed external beam radiation 2003.per HPI  . PROSTATE CANCER, HX OF 03/09/2008  . Radiation cystitis 2010.  Marland Kitchen Stroke [facility])    Past Surgical History:  Procedure Laterality Date  . AV FISTULA PLACEMENT Left 01/24/2013   Procedure: INSERTION OF ARTERIOVENOUS (AV) GORE-TEX GRAFT ARM;  Surgeon: Rosetta Posner, MD;  Location: [place];  Service: Vascular;  Laterality: Left;  . COLONOSCOPY N/A 06/17/2015   Procedure: COLONOSCOPY;  Surgeon: Gatha Mayer, MD;  Location: [place];  Service: Endoscopy;  Laterality: N/A;  . ESOPHAGOGASTRODUODENOSCOPY N/A 06/17/2015   Procedure: ESOPHAGOGASTRODUODENOSCOPY (EGD);  Surgeon: Gatha Mayer, MD;  Location: [facility] ENDOSCOPY;  Service: Endoscopy;  Laterality: N/A;  . FLEXIBLE  SIGMOIDOSCOPY N/A 06/15/2015   Procedure: FLEXIBLE SIGMOIDOSCOPY;  Surgeon: Gatha Mayer, MD;  Location: [place];  Service: Endoscopy;  Laterality: N/A;  . GIVENS CAPSULE STUDY N/A 07/06/2015   Procedure: GIVENS CAPSULE STUDY;  Surgeon: Milus Banister, MD;  Location: [place];  Service: Endoscopy;  Laterality: N/A;  . JOINT REPLACEMENT     HIP  . rotator cuff repair right  4/08  . s/p left hip replacement  2007   Dr. Percell Miller ortho  . TONSILLECTOMY      reports that he quit smoking about 34 years ago. His smoking use included Cigarettes. He quit after 5.00 years of use. He has never used smokeless tobacco. He reports that he does not drink alcohol or use drugs. family history includes Cancer in his brother and father; Diabetes in his father; Hypertension in his brother and father. No Known Allergies Current Outpatient Prescriptions on File Prior to Visit  Medication Sig Dispense Refill  . allopurinol (ZYLOPRIM) 100 MG tablet TAKE 1 TABLET DAILY 90 tablet 3  . aspirin EC 81 MG tablet Take 1 tablet (81 mg total) by mouth daily.    . calcitRIOL (ROCALTROL) 0.5 MCG capsule Take 2 capsules (1 mcg total) by mouth every Monday, Wednesday, and Friday with hemodialysis. 25 capsule 0  . citalopram (CELEXA) 10 MG tablet Take 1 tablet (10 mg total) by mouth daily. 90 tablet 3  . docusate sodium (COLACE) 100 MG capsule Take 2 capsules (200 mg total) by mouth 2 (two) times daily.  60 capsule 0  . doxercalciferol (HECTOROL) 0.5 MCG capsule Take 1.5 mcg by mouth 3 (three) times a week. MWF    . midodrine (PROAMATINE) 5 MG tablet Take 1 tablet (5 mg total) by mouth 3 (three) times daily. Take in the morning, lunch time and 4pm. Do not take after 4pm. (Patient taking differently: Take 5 mg by mouth 3 (three) times daily as needed (low blood pressure). Take in the morning, lunch time and 4pm. Do not take after 4pm.) 90 tablet 3  . Multiple Vitamin (MULTIVITAMIN WITH MINERALS) TABS tablet Take 1 tablet by  mouth daily.    Marland Kitchen nystatin (MYCOSTATIN/NYSTOP) powder Use as directed twice per day (Patient taking differently: Apply 1 g topically 2 (two) times daily as needed (rash). ) 45 g 1  . rosuvastatin (CRESTOR) 20 MG tablet TAKE 1 TABLET AT BEDTIME 90 tablet 3   No current facility-administered medications on file prior to visit.    Review of Systems  Constitutional: Negative for other unusual diaphoresis or sweats HENT: Negative for ear discharge or swelling Eyes: Negative for other worsening visual disturbances Respiratory: Negative for stridor or other swelling  Gastrointestinal: Negative for worsening distension or other blood Genitourinary: Negative for retention or other urinary change Musculoskeletal: Negative for other MSK pain or swelling Skin: Negative for color change or other new lesions Neurological: Negative for worsening tremors and other numbness  Psychiatric/Behavioral: Negative for worsening agitation or other fatigue All other system neg per pt    Objective:   Physical Exam BP 140/72   Pulse 81   Ht 5\' 9"  (1.753 m)   Wt 150 lb (68 kg)   SpO2 98%   BMI 22.15 kg/m  VS noted,  Constitutional: Pt appears in NAD HENT: Head: NCAT.  Right Ear: External ear normal.  Left Ear: External ear normal.  Eyes: . Pupils are equal, round, and reactive to light. Conjunctivae and EOM are normal Nose: without d/c or deformity Neck: Neck supple. Gross normal ROM Cardiovascular: Normal rate and regular rhythm.   Pulmonary/Chest: Effort normal and breath sounds without rales or wheezing.  Neurological: Pt is alert. At baseline orientation, motor grossly intact Skin: Skin is warm. No rashes, other new lesions, no LE edema Psychiatric: Pt behavior is normal without agitation  No other exam findings     Assessment & Plan:

## 2017-03-27 NOTE — Patient Instructions (Signed)

## 2017-03-28 ENCOUNTER — Encounter: Payer: Self-pay | Admitting: Internal Medicine

## 2017-03-28 DIAGNOSIS — N2581 Secondary hyperparathyroidism of renal origin: Secondary | ICD-10-CM | POA: Diagnosis not present

## 2017-03-28 DIAGNOSIS — N186 End stage renal disease: Secondary | ICD-10-CM | POA: Diagnosis not present

## 2017-03-28 DIAGNOSIS — D509 Iron deficiency anemia, unspecified: Secondary | ICD-10-CM | POA: Diagnosis not present

## 2017-03-28 LAB — LIPID PANEL
CHOL/HDL RATIO: 3
Cholesterol: 224 mg/dL — ABNORMAL HIGH (ref 0–200)
HDL: 84.1 mg/dL (ref >39.00)
LDL CALC: 124 mg/dL — AB (ref 0–99)
NonHDL: 140.07
TRIGLYCERIDES: 79 mg/dL (ref 0.0–149.0)
VLDL: 15.8 mg/dL (ref 0.0–40.0)

## 2017-03-30 DIAGNOSIS — N2581 Secondary hyperparathyroidism of renal origin: Secondary | ICD-10-CM | POA: Diagnosis not present

## 2017-03-30 DIAGNOSIS — N186 End stage renal disease: Secondary | ICD-10-CM | POA: Diagnosis not present

## 2017-03-30 DIAGNOSIS — D509 Iron deficiency anemia, unspecified: Secondary | ICD-10-CM | POA: Diagnosis not present

## 2017-03-31 NOTE — Assessment & Plan Note (Signed)
.  stable overall by history and exam, recent data reviewed with pt, and pt to continue medical treatment as before,  to f/u any worsening symptoms or concerns BP Readings from Last 3 Encounters:  03/27/17 140/72  03/20/17 (!) 104/44  02/15/17 128/60

## 2017-03-31 NOTE — Assessment & Plan Note (Signed)
With goal < 70, for f/u labs, cont renal low chol diet,  to f/u any worsening symptoms or concerns

## 2017-03-31 NOTE — Assessment & Plan Note (Signed)
stable overall by history and exam, and pt to continue medical treatment as before,  to f/u any worsening symptoms or concerns, continue to address risk factors

## 2017-04-02 DIAGNOSIS — N2581 Secondary hyperparathyroidism of renal origin: Secondary | ICD-10-CM | POA: Diagnosis not present

## 2017-04-02 DIAGNOSIS — N186 End stage renal disease: Secondary | ICD-10-CM | POA: Diagnosis not present

## 2017-04-02 DIAGNOSIS — D509 Iron deficiency anemia, unspecified: Secondary | ICD-10-CM | POA: Diagnosis not present

## 2017-04-04 DIAGNOSIS — N2581 Secondary hyperparathyroidism of renal origin: Secondary | ICD-10-CM | POA: Diagnosis not present

## 2017-04-04 DIAGNOSIS — N186 End stage renal disease: Secondary | ICD-10-CM | POA: Diagnosis not present

## 2017-04-04 DIAGNOSIS — D509 Iron deficiency anemia, unspecified: Secondary | ICD-10-CM | POA: Diagnosis not present

## 2017-04-05 DIAGNOSIS — N186 End stage renal disease: Secondary | ICD-10-CM | POA: Diagnosis not present

## 2017-04-05 DIAGNOSIS — I158 Other secondary hypertension: Secondary | ICD-10-CM | POA: Diagnosis not present

## 2017-04-05 DIAGNOSIS — Z992 Dependence on renal dialysis: Secondary | ICD-10-CM | POA: Diagnosis not present

## 2017-04-06 DIAGNOSIS — N186 End stage renal disease: Secondary | ICD-10-CM | POA: Diagnosis not present

## 2017-04-06 DIAGNOSIS — N2581 Secondary hyperparathyroidism of renal origin: Secondary | ICD-10-CM | POA: Diagnosis not present

## 2017-04-06 DIAGNOSIS — D631 Anemia in chronic kidney disease: Secondary | ICD-10-CM | POA: Diagnosis not present

## 2017-04-09 DIAGNOSIS — N2581 Secondary hyperparathyroidism of renal origin: Secondary | ICD-10-CM | POA: Diagnosis not present

## 2017-04-09 DIAGNOSIS — D631 Anemia in chronic kidney disease: Secondary | ICD-10-CM | POA: Diagnosis not present

## 2017-04-09 DIAGNOSIS — N186 End stage renal disease: Secondary | ICD-10-CM | POA: Diagnosis not present

## 2017-04-11 DIAGNOSIS — N186 End stage renal disease: Secondary | ICD-10-CM | POA: Diagnosis not present

## 2017-04-11 DIAGNOSIS — N2581 Secondary hyperparathyroidism of renal origin: Secondary | ICD-10-CM | POA: Diagnosis not present

## 2017-04-11 DIAGNOSIS — D631 Anemia in chronic kidney disease: Secondary | ICD-10-CM | POA: Diagnosis not present

## 2017-04-13 DIAGNOSIS — D631 Anemia in chronic kidney disease: Secondary | ICD-10-CM | POA: Diagnosis not present

## 2017-04-13 DIAGNOSIS — N2581 Secondary hyperparathyroidism of renal origin: Secondary | ICD-10-CM | POA: Diagnosis not present

## 2017-04-13 DIAGNOSIS — N186 End stage renal disease: Secondary | ICD-10-CM | POA: Diagnosis not present

## 2017-04-16 DIAGNOSIS — N186 End stage renal disease: Secondary | ICD-10-CM | POA: Diagnosis not present

## 2017-04-16 DIAGNOSIS — N2581 Secondary hyperparathyroidism of renal origin: Secondary | ICD-10-CM | POA: Diagnosis not present

## 2017-04-16 DIAGNOSIS — D631 Anemia in chronic kidney disease: Secondary | ICD-10-CM | POA: Diagnosis not present

## 2017-04-18 DIAGNOSIS — N186 End stage renal disease: Secondary | ICD-10-CM | POA: Diagnosis not present

## 2017-04-18 DIAGNOSIS — D631 Anemia in chronic kidney disease: Secondary | ICD-10-CM | POA: Diagnosis not present

## 2017-04-18 DIAGNOSIS — N2581 Secondary hyperparathyroidism of renal origin: Secondary | ICD-10-CM | POA: Diagnosis not present

## 2017-04-20 DIAGNOSIS — N2581 Secondary hyperparathyroidism of renal origin: Secondary | ICD-10-CM | POA: Diagnosis not present

## 2017-04-20 DIAGNOSIS — D631 Anemia in chronic kidney disease: Secondary | ICD-10-CM | POA: Diagnosis not present

## 2017-04-20 DIAGNOSIS — N186 End stage renal disease: Secondary | ICD-10-CM | POA: Diagnosis not present

## 2017-04-23 DIAGNOSIS — D631 Anemia in chronic kidney disease: Secondary | ICD-10-CM | POA: Diagnosis not present

## 2017-04-23 DIAGNOSIS — N186 End stage renal disease: Secondary | ICD-10-CM | POA: Diagnosis not present

## 2017-04-23 DIAGNOSIS — N2581 Secondary hyperparathyroidism of renal origin: Secondary | ICD-10-CM | POA: Diagnosis not present

## 2017-04-24 DIAGNOSIS — C61 Malignant neoplasm of prostate: Secondary | ICD-10-CM | POA: Diagnosis not present

## 2017-04-25 DIAGNOSIS — C61 Malignant neoplasm of prostate: Secondary | ICD-10-CM | POA: Diagnosis not present

## 2017-04-25 DIAGNOSIS — D631 Anemia in chronic kidney disease: Secondary | ICD-10-CM | POA: Diagnosis not present

## 2017-04-25 DIAGNOSIS — N3040 Irradiation cystitis without hematuria: Secondary | ICD-10-CM | POA: Diagnosis not present

## 2017-04-25 DIAGNOSIS — N2581 Secondary hyperparathyroidism of renal origin: Secondary | ICD-10-CM | POA: Diagnosis not present

## 2017-04-25 DIAGNOSIS — N186 End stage renal disease: Secondary | ICD-10-CM | POA: Diagnosis not present

## 2017-04-27 DIAGNOSIS — D631 Anemia in chronic kidney disease: Secondary | ICD-10-CM | POA: Diagnosis not present

## 2017-04-27 DIAGNOSIS — N2581 Secondary hyperparathyroidism of renal origin: Secondary | ICD-10-CM | POA: Diagnosis not present

## 2017-04-27 DIAGNOSIS — N186 End stage renal disease: Secondary | ICD-10-CM | POA: Diagnosis not present

## 2017-04-27 DIAGNOSIS — L80 Vitiligo: Secondary | ICD-10-CM | POA: Diagnosis not present

## 2017-04-30 DIAGNOSIS — D631 Anemia in chronic kidney disease: Secondary | ICD-10-CM | POA: Diagnosis not present

## 2017-04-30 DIAGNOSIS — N2581 Secondary hyperparathyroidism of renal origin: Secondary | ICD-10-CM | POA: Diagnosis not present

## 2017-04-30 DIAGNOSIS — N186 End stage renal disease: Secondary | ICD-10-CM | POA: Diagnosis not present

## 2017-05-02 DIAGNOSIS — N186 End stage renal disease: Secondary | ICD-10-CM | POA: Diagnosis not present

## 2017-05-02 DIAGNOSIS — D631 Anemia in chronic kidney disease: Secondary | ICD-10-CM | POA: Diagnosis not present

## 2017-05-02 DIAGNOSIS — N2581 Secondary hyperparathyroidism of renal origin: Secondary | ICD-10-CM | POA: Diagnosis not present

## 2017-05-04 DIAGNOSIS — N2581 Secondary hyperparathyroidism of renal origin: Secondary | ICD-10-CM | POA: Diagnosis not present

## 2017-05-04 DIAGNOSIS — D631 Anemia in chronic kidney disease: Secondary | ICD-10-CM | POA: Diagnosis not present

## 2017-05-04 DIAGNOSIS — N186 End stage renal disease: Secondary | ICD-10-CM | POA: Diagnosis not present

## 2017-05-05 DIAGNOSIS — I158 Other secondary hypertension: Secondary | ICD-10-CM | POA: Diagnosis not present

## 2017-05-05 DIAGNOSIS — Z992 Dependence on renal dialysis: Secondary | ICD-10-CM | POA: Diagnosis not present

## 2017-05-05 DIAGNOSIS — N186 End stage renal disease: Secondary | ICD-10-CM | POA: Diagnosis not present

## 2017-05-07 DIAGNOSIS — D631 Anemia in chronic kidney disease: Secondary | ICD-10-CM | POA: Diagnosis not present

## 2017-05-07 DIAGNOSIS — N186 End stage renal disease: Secondary | ICD-10-CM | POA: Diagnosis not present

## 2017-05-07 DIAGNOSIS — D509 Iron deficiency anemia, unspecified: Secondary | ICD-10-CM | POA: Diagnosis not present

## 2017-05-07 DIAGNOSIS — N2581 Secondary hyperparathyroidism of renal origin: Secondary | ICD-10-CM | POA: Diagnosis not present

## 2017-05-09 DIAGNOSIS — N186 End stage renal disease: Secondary | ICD-10-CM | POA: Diagnosis not present

## 2017-05-09 DIAGNOSIS — N2581 Secondary hyperparathyroidism of renal origin: Secondary | ICD-10-CM | POA: Diagnosis not present

## 2017-05-09 DIAGNOSIS — D631 Anemia in chronic kidney disease: Secondary | ICD-10-CM | POA: Diagnosis not present

## 2017-05-09 DIAGNOSIS — D509 Iron deficiency anemia, unspecified: Secondary | ICD-10-CM | POA: Diagnosis not present

## 2017-05-11 DIAGNOSIS — N2581 Secondary hyperparathyroidism of renal origin: Secondary | ICD-10-CM | POA: Diagnosis not present

## 2017-05-11 DIAGNOSIS — D509 Iron deficiency anemia, unspecified: Secondary | ICD-10-CM | POA: Diagnosis not present

## 2017-05-11 DIAGNOSIS — D631 Anemia in chronic kidney disease: Secondary | ICD-10-CM | POA: Diagnosis not present

## 2017-05-11 DIAGNOSIS — N186 End stage renal disease: Secondary | ICD-10-CM | POA: Diagnosis not present

## 2017-05-14 DIAGNOSIS — D631 Anemia in chronic kidney disease: Secondary | ICD-10-CM | POA: Diagnosis not present

## 2017-05-14 DIAGNOSIS — D509 Iron deficiency anemia, unspecified: Secondary | ICD-10-CM | POA: Diagnosis not present

## 2017-05-14 DIAGNOSIS — N2581 Secondary hyperparathyroidism of renal origin: Secondary | ICD-10-CM | POA: Diagnosis not present

## 2017-05-14 DIAGNOSIS — N186 End stage renal disease: Secondary | ICD-10-CM | POA: Diagnosis not present

## 2017-05-16 DIAGNOSIS — N2581 Secondary hyperparathyroidism of renal origin: Secondary | ICD-10-CM | POA: Diagnosis not present

## 2017-05-16 DIAGNOSIS — D631 Anemia in chronic kidney disease: Secondary | ICD-10-CM | POA: Diagnosis not present

## 2017-05-16 DIAGNOSIS — N186 End stage renal disease: Secondary | ICD-10-CM | POA: Diagnosis not present

## 2017-05-16 DIAGNOSIS — D509 Iron deficiency anemia, unspecified: Secondary | ICD-10-CM | POA: Diagnosis not present

## 2017-05-18 DIAGNOSIS — D631 Anemia in chronic kidney disease: Secondary | ICD-10-CM | POA: Diagnosis not present

## 2017-05-18 DIAGNOSIS — N186 End stage renal disease: Secondary | ICD-10-CM | POA: Diagnosis not present

## 2017-05-18 DIAGNOSIS — D509 Iron deficiency anemia, unspecified: Secondary | ICD-10-CM | POA: Diagnosis not present

## 2017-05-18 DIAGNOSIS — N2581 Secondary hyperparathyroidism of renal origin: Secondary | ICD-10-CM | POA: Diagnosis not present

## 2017-05-21 DIAGNOSIS — N2581 Secondary hyperparathyroidism of renal origin: Secondary | ICD-10-CM | POA: Diagnosis not present

## 2017-05-21 DIAGNOSIS — D509 Iron deficiency anemia, unspecified: Secondary | ICD-10-CM | POA: Diagnosis not present

## 2017-05-21 DIAGNOSIS — D631 Anemia in chronic kidney disease: Secondary | ICD-10-CM | POA: Diagnosis not present

## 2017-05-21 DIAGNOSIS — N186 End stage renal disease: Secondary | ICD-10-CM | POA: Diagnosis not present

## 2017-05-23 DIAGNOSIS — N2581 Secondary hyperparathyroidism of renal origin: Secondary | ICD-10-CM | POA: Diagnosis not present

## 2017-05-23 DIAGNOSIS — D631 Anemia in chronic kidney disease: Secondary | ICD-10-CM | POA: Diagnosis not present

## 2017-05-23 DIAGNOSIS — D509 Iron deficiency anemia, unspecified: Secondary | ICD-10-CM | POA: Diagnosis not present

## 2017-05-23 DIAGNOSIS — N186 End stage renal disease: Secondary | ICD-10-CM | POA: Diagnosis not present

## 2017-05-25 DIAGNOSIS — N2581 Secondary hyperparathyroidism of renal origin: Secondary | ICD-10-CM | POA: Diagnosis not present

## 2017-05-25 DIAGNOSIS — D631 Anemia in chronic kidney disease: Secondary | ICD-10-CM | POA: Diagnosis not present

## 2017-05-25 DIAGNOSIS — D509 Iron deficiency anemia, unspecified: Secondary | ICD-10-CM | POA: Diagnosis not present

## 2017-05-25 DIAGNOSIS — N186 End stage renal disease: Secondary | ICD-10-CM | POA: Diagnosis not present

## 2017-05-28 DIAGNOSIS — D509 Iron deficiency anemia, unspecified: Secondary | ICD-10-CM | POA: Diagnosis not present

## 2017-05-28 DIAGNOSIS — N186 End stage renal disease: Secondary | ICD-10-CM | POA: Diagnosis not present

## 2017-05-28 DIAGNOSIS — D631 Anemia in chronic kidney disease: Secondary | ICD-10-CM | POA: Diagnosis not present

## 2017-05-28 DIAGNOSIS — N2581 Secondary hyperparathyroidism of renal origin: Secondary | ICD-10-CM | POA: Diagnosis not present

## 2017-05-30 DIAGNOSIS — N186 End stage renal disease: Secondary | ICD-10-CM | POA: Diagnosis not present

## 2017-05-30 DIAGNOSIS — D631 Anemia in chronic kidney disease: Secondary | ICD-10-CM | POA: Diagnosis not present

## 2017-05-30 DIAGNOSIS — D509 Iron deficiency anemia, unspecified: Secondary | ICD-10-CM | POA: Diagnosis not present

## 2017-05-30 DIAGNOSIS — N2581 Secondary hyperparathyroidism of renal origin: Secondary | ICD-10-CM | POA: Diagnosis not present

## 2017-06-01 DIAGNOSIS — N2581 Secondary hyperparathyroidism of renal origin: Secondary | ICD-10-CM | POA: Diagnosis not present

## 2017-06-01 DIAGNOSIS — D509 Iron deficiency anemia, unspecified: Secondary | ICD-10-CM | POA: Diagnosis not present

## 2017-06-01 DIAGNOSIS — N186 End stage renal disease: Secondary | ICD-10-CM | POA: Diagnosis not present

## 2017-06-01 DIAGNOSIS — D631 Anemia in chronic kidney disease: Secondary | ICD-10-CM | POA: Diagnosis not present

## 2017-06-04 DIAGNOSIS — D631 Anemia in chronic kidney disease: Secondary | ICD-10-CM | POA: Diagnosis not present

## 2017-06-04 DIAGNOSIS — D509 Iron deficiency anemia, unspecified: Secondary | ICD-10-CM | POA: Diagnosis not present

## 2017-06-04 DIAGNOSIS — N186 End stage renal disease: Secondary | ICD-10-CM | POA: Diagnosis not present

## 2017-06-04 DIAGNOSIS — N2581 Secondary hyperparathyroidism of renal origin: Secondary | ICD-10-CM | POA: Diagnosis not present

## 2017-06-05 DIAGNOSIS — N186 End stage renal disease: Secondary | ICD-10-CM | POA: Diagnosis not present

## 2017-06-05 DIAGNOSIS — I158 Other secondary hypertension: Secondary | ICD-10-CM | POA: Diagnosis not present

## 2017-06-05 DIAGNOSIS — Z992 Dependence on renal dialysis: Secondary | ICD-10-CM | POA: Diagnosis not present

## 2017-06-06 DIAGNOSIS — D631 Anemia in chronic kidney disease: Secondary | ICD-10-CM | POA: Diagnosis not present

## 2017-06-06 DIAGNOSIS — N186 End stage renal disease: Secondary | ICD-10-CM | POA: Diagnosis not present

## 2017-06-06 DIAGNOSIS — N2581 Secondary hyperparathyroidism of renal origin: Secondary | ICD-10-CM | POA: Diagnosis not present

## 2017-06-08 DIAGNOSIS — D631 Anemia in chronic kidney disease: Secondary | ICD-10-CM | POA: Diagnosis not present

## 2017-06-08 DIAGNOSIS — N2581 Secondary hyperparathyroidism of renal origin: Secondary | ICD-10-CM | POA: Diagnosis not present

## 2017-06-08 DIAGNOSIS — N186 End stage renal disease: Secondary | ICD-10-CM | POA: Diagnosis not present

## 2017-06-11 DIAGNOSIS — N186 End stage renal disease: Secondary | ICD-10-CM | POA: Diagnosis not present

## 2017-06-11 DIAGNOSIS — D631 Anemia in chronic kidney disease: Secondary | ICD-10-CM | POA: Diagnosis not present

## 2017-06-11 DIAGNOSIS — N2581 Secondary hyperparathyroidism of renal origin: Secondary | ICD-10-CM | POA: Diagnosis not present

## 2017-06-13 DIAGNOSIS — N186 End stage renal disease: Secondary | ICD-10-CM | POA: Diagnosis not present

## 2017-06-13 DIAGNOSIS — N2581 Secondary hyperparathyroidism of renal origin: Secondary | ICD-10-CM | POA: Diagnosis not present

## 2017-06-13 DIAGNOSIS — D631 Anemia in chronic kidney disease: Secondary | ICD-10-CM | POA: Diagnosis not present

## 2017-06-15 DIAGNOSIS — N186 End stage renal disease: Secondary | ICD-10-CM | POA: Diagnosis not present

## 2017-06-15 DIAGNOSIS — D631 Anemia in chronic kidney disease: Secondary | ICD-10-CM | POA: Diagnosis not present

## 2017-06-15 DIAGNOSIS — N2581 Secondary hyperparathyroidism of renal origin: Secondary | ICD-10-CM | POA: Diagnosis not present

## 2017-06-18 DIAGNOSIS — D631 Anemia in chronic kidney disease: Secondary | ICD-10-CM | POA: Diagnosis not present

## 2017-06-18 DIAGNOSIS — N2581 Secondary hyperparathyroidism of renal origin: Secondary | ICD-10-CM | POA: Diagnosis not present

## 2017-06-18 DIAGNOSIS — N186 End stage renal disease: Secondary | ICD-10-CM | POA: Diagnosis not present

## 2017-06-20 DIAGNOSIS — N186 End stage renal disease: Secondary | ICD-10-CM | POA: Diagnosis not present

## 2017-06-20 DIAGNOSIS — D631 Anemia in chronic kidney disease: Secondary | ICD-10-CM | POA: Diagnosis not present

## 2017-06-20 DIAGNOSIS — N2581 Secondary hyperparathyroidism of renal origin: Secondary | ICD-10-CM | POA: Diagnosis not present

## 2017-06-22 DIAGNOSIS — N2581 Secondary hyperparathyroidism of renal origin: Secondary | ICD-10-CM | POA: Diagnosis not present

## 2017-06-22 DIAGNOSIS — D631 Anemia in chronic kidney disease: Secondary | ICD-10-CM | POA: Diagnosis not present

## 2017-06-22 DIAGNOSIS — N186 End stage renal disease: Secondary | ICD-10-CM | POA: Diagnosis not present

## 2017-06-24 IMAGING — NM NM BONE WHOLE BODY
2 series · 2 of 2 positions shown · non-contrast
Comparison: 06/24/2015

CLINICAL DATA: History of prostate carcinoma

EXAM:
NUCLEAR MEDICINE WHOLE BODY BONE SCAN
TECHNIQUE: Whole body anterior and posterior images were obtained approximately
3 hours after intravenous injection of radiopharmaceutical.
RADIOPHARMACEUTICALS:  20.6 mCi Xechnetium-66m MDP IV

[Series 1: whole body · 2.66mm/px · 1 of 1 slices shown (1 of 2)]
[im 1/1]
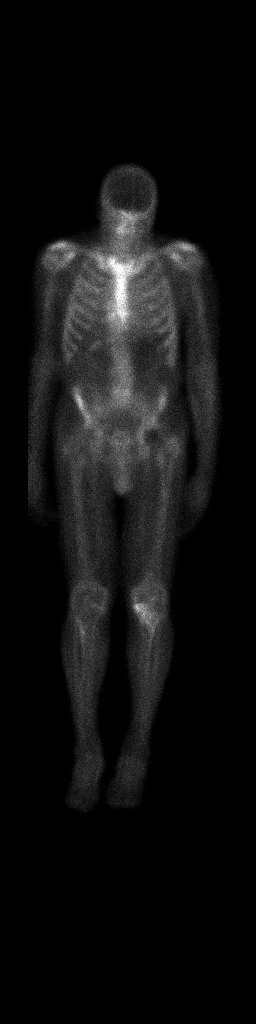

[Series 1: whole body · 2.66mm/px · 1 of 1 slices shown (2 of 2)]
[im 1/1]
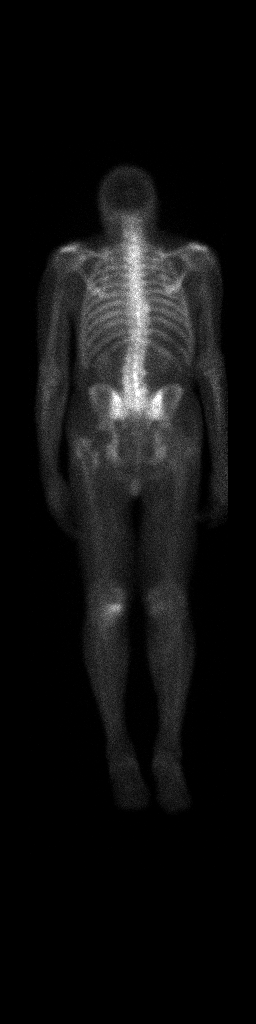

[2 of 2 positions shown; findings below may reference images not displayed]

FINDINGS: There is adequate uptake of radioactive tracer throughout the bony
skeleton. Bilateral renal activity is seen. Changes of prior left
hip replacement are seen. Increased activity is noted about the left
knee joint consistent with degenerative change. Degenerative
scoliosis of the thoracolumbar spine is seen. No focal area of
increased activity is identified to suggest metastatic disease.
Degenerative changes are noted about the shoulder joints as well.
IMPRESSION: No findings to suggest metastatic disease. Chronic degenerative
changes are noted.

## 2017-06-25 DIAGNOSIS — N186 End stage renal disease: Secondary | ICD-10-CM | POA: Diagnosis not present

## 2017-06-25 DIAGNOSIS — N2581 Secondary hyperparathyroidism of renal origin: Secondary | ICD-10-CM | POA: Diagnosis not present

## 2017-06-25 DIAGNOSIS — D631 Anemia in chronic kidney disease: Secondary | ICD-10-CM | POA: Diagnosis not present

## 2017-06-27 DIAGNOSIS — D631 Anemia in chronic kidney disease: Secondary | ICD-10-CM | POA: Diagnosis not present

## 2017-06-27 DIAGNOSIS — N186 End stage renal disease: Secondary | ICD-10-CM | POA: Diagnosis not present

## 2017-06-27 DIAGNOSIS — N2581 Secondary hyperparathyroidism of renal origin: Secondary | ICD-10-CM | POA: Diagnosis not present

## 2017-06-29 DIAGNOSIS — N2581 Secondary hyperparathyroidism of renal origin: Secondary | ICD-10-CM | POA: Diagnosis not present

## 2017-06-29 DIAGNOSIS — D631 Anemia in chronic kidney disease: Secondary | ICD-10-CM | POA: Diagnosis not present

## 2017-06-29 DIAGNOSIS — N186 End stage renal disease: Secondary | ICD-10-CM | POA: Diagnosis not present

## 2017-07-02 DIAGNOSIS — N186 End stage renal disease: Secondary | ICD-10-CM | POA: Diagnosis not present

## 2017-07-02 DIAGNOSIS — N2581 Secondary hyperparathyroidism of renal origin: Secondary | ICD-10-CM | POA: Diagnosis not present

## 2017-07-02 DIAGNOSIS — D631 Anemia in chronic kidney disease: Secondary | ICD-10-CM | POA: Diagnosis not present

## 2017-07-04 DIAGNOSIS — D631 Anemia in chronic kidney disease: Secondary | ICD-10-CM | POA: Diagnosis not present

## 2017-07-04 DIAGNOSIS — N2581 Secondary hyperparathyroidism of renal origin: Secondary | ICD-10-CM | POA: Diagnosis not present

## 2017-07-04 DIAGNOSIS — N186 End stage renal disease: Secondary | ICD-10-CM | POA: Diagnosis not present

## 2017-07-06 DIAGNOSIS — N2581 Secondary hyperparathyroidism of renal origin: Secondary | ICD-10-CM | POA: Diagnosis not present

## 2017-07-06 DIAGNOSIS — D631 Anemia in chronic kidney disease: Secondary | ICD-10-CM | POA: Diagnosis not present

## 2017-07-06 DIAGNOSIS — I158 Other secondary hypertension: Secondary | ICD-10-CM | POA: Diagnosis not present

## 2017-07-06 DIAGNOSIS — N186 End stage renal disease: Secondary | ICD-10-CM | POA: Diagnosis not present

## 2017-07-06 DIAGNOSIS — Z992 Dependence on renal dialysis: Secondary | ICD-10-CM | POA: Diagnosis not present

## 2017-07-09 DIAGNOSIS — N186 End stage renal disease: Secondary | ICD-10-CM | POA: Diagnosis not present

## 2017-07-09 DIAGNOSIS — N2581 Secondary hyperparathyroidism of renal origin: Secondary | ICD-10-CM | POA: Diagnosis not present

## 2017-07-11 DIAGNOSIS — N186 End stage renal disease: Secondary | ICD-10-CM | POA: Diagnosis not present

## 2017-07-11 DIAGNOSIS — N2581 Secondary hyperparathyroidism of renal origin: Secondary | ICD-10-CM | POA: Diagnosis not present

## 2017-07-13 DIAGNOSIS — N2581 Secondary hyperparathyroidism of renal origin: Secondary | ICD-10-CM | POA: Diagnosis not present

## 2017-07-13 DIAGNOSIS — N186 End stage renal disease: Secondary | ICD-10-CM | POA: Diagnosis not present

## 2017-07-16 DIAGNOSIS — N2581 Secondary hyperparathyroidism of renal origin: Secondary | ICD-10-CM | POA: Diagnosis not present

## 2017-07-16 DIAGNOSIS — N186 End stage renal disease: Secondary | ICD-10-CM | POA: Diagnosis not present

## 2017-07-18 DIAGNOSIS — N186 End stage renal disease: Secondary | ICD-10-CM | POA: Diagnosis not present

## 2017-07-18 DIAGNOSIS — N2581 Secondary hyperparathyroidism of renal origin: Secondary | ICD-10-CM | POA: Diagnosis not present

## 2017-07-20 DIAGNOSIS — N2581 Secondary hyperparathyroidism of renal origin: Secondary | ICD-10-CM | POA: Diagnosis not present

## 2017-07-20 DIAGNOSIS — N186 End stage renal disease: Secondary | ICD-10-CM | POA: Diagnosis not present

## 2017-07-23 DIAGNOSIS — N186 End stage renal disease: Secondary | ICD-10-CM | POA: Diagnosis not present

## 2017-07-23 DIAGNOSIS — N2581 Secondary hyperparathyroidism of renal origin: Secondary | ICD-10-CM | POA: Diagnosis not present

## 2017-07-25 DIAGNOSIS — N186 End stage renal disease: Secondary | ICD-10-CM | POA: Diagnosis not present

## 2017-07-25 DIAGNOSIS — N2581 Secondary hyperparathyroidism of renal origin: Secondary | ICD-10-CM | POA: Diagnosis not present

## 2017-07-27 DIAGNOSIS — N186 End stage renal disease: Secondary | ICD-10-CM | POA: Diagnosis not present

## 2017-07-27 DIAGNOSIS — N2581 Secondary hyperparathyroidism of renal origin: Secondary | ICD-10-CM | POA: Diagnosis not present

## 2017-07-30 DIAGNOSIS — N2581 Secondary hyperparathyroidism of renal origin: Secondary | ICD-10-CM | POA: Diagnosis not present

## 2017-07-30 DIAGNOSIS — N186 End stage renal disease: Secondary | ICD-10-CM | POA: Diagnosis not present

## 2017-08-01 DIAGNOSIS — N186 End stage renal disease: Secondary | ICD-10-CM | POA: Diagnosis not present

## 2017-08-01 DIAGNOSIS — N2581 Secondary hyperparathyroidism of renal origin: Secondary | ICD-10-CM | POA: Diagnosis not present

## 2017-08-03 DIAGNOSIS — N186 End stage renal disease: Secondary | ICD-10-CM | POA: Diagnosis not present

## 2017-08-03 DIAGNOSIS — N2581 Secondary hyperparathyroidism of renal origin: Secondary | ICD-10-CM | POA: Diagnosis not present

## 2017-08-05 DIAGNOSIS — I158 Other secondary hypertension: Secondary | ICD-10-CM | POA: Diagnosis not present

## 2017-08-05 DIAGNOSIS — N186 End stage renal disease: Secondary | ICD-10-CM | POA: Diagnosis not present

## 2017-08-05 DIAGNOSIS — Z992 Dependence on renal dialysis: Secondary | ICD-10-CM | POA: Diagnosis not present

## 2017-08-06 DIAGNOSIS — N186 End stage renal disease: Secondary | ICD-10-CM | POA: Diagnosis not present

## 2017-08-06 DIAGNOSIS — N2581 Secondary hyperparathyroidism of renal origin: Secondary | ICD-10-CM | POA: Diagnosis not present

## 2017-08-06 DIAGNOSIS — D631 Anemia in chronic kidney disease: Secondary | ICD-10-CM | POA: Diagnosis not present

## 2017-08-08 DIAGNOSIS — N2581 Secondary hyperparathyroidism of renal origin: Secondary | ICD-10-CM | POA: Diagnosis not present

## 2017-08-08 DIAGNOSIS — D631 Anemia in chronic kidney disease: Secondary | ICD-10-CM | POA: Diagnosis not present

## 2017-08-08 DIAGNOSIS — N186 End stage renal disease: Secondary | ICD-10-CM | POA: Diagnosis not present

## 2017-08-10 DIAGNOSIS — D631 Anemia in chronic kidney disease: Secondary | ICD-10-CM | POA: Diagnosis not present

## 2017-08-10 DIAGNOSIS — N2581 Secondary hyperparathyroidism of renal origin: Secondary | ICD-10-CM | POA: Diagnosis not present

## 2017-08-10 DIAGNOSIS — N186 End stage renal disease: Secondary | ICD-10-CM | POA: Diagnosis not present

## 2017-08-13 DIAGNOSIS — N186 End stage renal disease: Secondary | ICD-10-CM | POA: Diagnosis not present

## 2017-08-13 DIAGNOSIS — D631 Anemia in chronic kidney disease: Secondary | ICD-10-CM | POA: Diagnosis not present

## 2017-08-13 DIAGNOSIS — N2581 Secondary hyperparathyroidism of renal origin: Secondary | ICD-10-CM | POA: Diagnosis not present

## 2017-08-15 DIAGNOSIS — D631 Anemia in chronic kidney disease: Secondary | ICD-10-CM | POA: Diagnosis not present

## 2017-08-15 DIAGNOSIS — N186 End stage renal disease: Secondary | ICD-10-CM | POA: Diagnosis not present

## 2017-08-15 DIAGNOSIS — N2581 Secondary hyperparathyroidism of renal origin: Secondary | ICD-10-CM | POA: Diagnosis not present

## 2017-08-17 ENCOUNTER — Encounter (HOSPITAL_COMMUNITY): Payer: Self-pay | Admitting: *Deleted

## 2017-08-17 ENCOUNTER — Emergency Department (HOSPITAL_COMMUNITY): Payer: Medicare Other

## 2017-08-17 ENCOUNTER — Emergency Department (HOSPITAL_COMMUNITY)
Admission: EM | Admit: 2017-08-17 | Discharge: 2017-08-17 | Disposition: A | Discharge status: Home / Self Care | Payer: Medicare Other | Attending: Emergency Medicine | Admitting: Emergency Medicine

## 2017-08-17 DIAGNOSIS — Y929 Unspecified place or not applicable: Secondary | ICD-10-CM | POA: Insufficient documentation

## 2017-08-17 DIAGNOSIS — N2581 Secondary hyperparathyroidism of renal origin: Secondary | ICD-10-CM | POA: Diagnosis not present

## 2017-08-17 DIAGNOSIS — Z8673 Personal history of transient ischemic attack (TIA), and cerebral infarction without residual deficits: Secondary | ICD-10-CM | POA: Insufficient documentation

## 2017-08-17 DIAGNOSIS — Z992 Dependence on renal dialysis: Secondary | ICD-10-CM | POA: Diagnosis not present

## 2017-08-17 DIAGNOSIS — Y939 Activity, unspecified: Secondary | ICD-10-CM | POA: Diagnosis not present

## 2017-08-17 DIAGNOSIS — Y998 Other external cause status: Secondary | ICD-10-CM | POA: Insufficient documentation

## 2017-08-17 DIAGNOSIS — S0990XA Unspecified injury of head, initial encounter: Secondary | ICD-10-CM | POA: Diagnosis not present

## 2017-08-17 DIAGNOSIS — S8002XA Contusion of left knee, initial encounter: Secondary | ICD-10-CM

## 2017-08-17 DIAGNOSIS — W01198A Fall on same level from slipping, tripping and stumbling with subsequent striking against other object, initial encounter: Secondary | ICD-10-CM | POA: Diagnosis not present

## 2017-08-17 DIAGNOSIS — S199XXA Unspecified injury of neck, initial encounter: Secondary | ICD-10-CM | POA: Diagnosis not present

## 2017-08-17 DIAGNOSIS — S0083XA Contusion of other part of head, initial encounter: Secondary | ICD-10-CM | POA: Diagnosis not present

## 2017-08-17 DIAGNOSIS — R51 Headache: Secondary | ICD-10-CM | POA: Insufficient documentation

## 2017-08-17 DIAGNOSIS — N186 End stage renal disease: Secondary | ICD-10-CM | POA: Diagnosis not present

## 2017-08-17 DIAGNOSIS — I120 Hypertensive chronic kidney disease with stage 5 chronic kidney disease or end stage renal disease: Secondary | ICD-10-CM | POA: Diagnosis not present

## 2017-08-17 DIAGNOSIS — D631 Anemia in chronic kidney disease: Secondary | ICD-10-CM | POA: Diagnosis not present

## 2017-08-17 DIAGNOSIS — Z8546 Personal history of malignant neoplasm of prostate: Secondary | ICD-10-CM | POA: Insufficient documentation

## 2017-08-17 DIAGNOSIS — M25562 Pain in left knee: Secondary | ICD-10-CM | POA: Diagnosis not present

## 2017-08-17 DIAGNOSIS — Z87891 Personal history of nicotine dependence: Secondary | ICD-10-CM | POA: Diagnosis not present

## 2017-08-17 DIAGNOSIS — S8992XA Unspecified injury of left lower leg, initial encounter: Secondary | ICD-10-CM | POA: Diagnosis not present

## 2017-08-17 DIAGNOSIS — W19XXXA Unspecified fall, initial encounter: Secondary | ICD-10-CM

## 2017-08-17 NOTE — Discharge Instructions (Signed)
Ice and elevate your knee. ACE wrap for compression. Tylenol for pain. Follow up with family doctor for recheck if not improving.

## 2017-08-17 NOTE — ED Provider Notes (Signed)
[place] DEPT Provider Note   CSN: [id number] Arrival date & time: 08/17/17  1625     History   Chief Complaint Chief Complaint  Patient presents with  . Fall    HPI [name] is a 83 y.o. male.  HPI [name] is a 83 y.o. male with hx of anemia, ESRD, Hypertension, CVA, presents to emergency department after a fall. Patient states he tripped over a carpet and fell face hitting on the floor. He reports abrasion to the forehead, pain to the left knee. He was still able to get up and ambulate. After the knee continued to swell he decided to come to the emergency department for evaluation. He does report mild headache, denies any neck pain. Denies any numbness or weakness to extremities. Denies any treatment prior to coming in. Patient does not take any medications regularly for pain.  Past Medical History:  Diagnosis Date  . ANEMIA-IRON DEFICIENCY 03/09/2008  . Arthritis    cervical spine.   . Complex renal cyst 06/19/2011  . Depression 03/26/2014  . DIVERTICULOSIS, COLON 03/09/2008  . ESRD (end stage renal disease) ([place]) 03/09/2008  . Flu 12/08/2016  . GOUT 03/09/2008  . Hemorrhoids 08/2009   internal.   . History of blood transfusion   . HYPERLIPIDEMIA 03/09/2008  . Hyperparathyroidism ([place])   . HYPERTENSION 03/09/2008  . Prostate cancer [facility]) 2002   Completed external beam radiation 2003.per HPI  . PROSTATE CANCER, HX OF 03/09/2008  . Radiation cystitis 2010.  Marland Kitchen Stroke [facility])     Patient Active Problem List   Diagnosis Date Noted  . Influenza A 12/08/2016  . Elevated troponin 12/08/2016  . Anemia in chronic kidney disease (CKD) 10/04/2016  . Orthostatic hypotension 09/07/2016  . ESRD on dialysis ([place]) 09/09/2015  . Cerebrovascular accident (CVA) due to thrombosis of right middle cerebral artery ([place]) 09/09/2015  . History of GI bleed   . HLD (hyperlipidemia)   . Benign neoplasm of descending colon   . Gastritis   . Syncope 06/15/2015  . Diverticulosis of  colon with hemorrhage   . Depression 03/26/2014  . Complex renal cyst 06/19/2011  . Preventative health care 06/17/2011  . SHOULDER PAIN, LEFT 06/03/2010  . Gout 03/09/2008  . Iron deficiency anemia 03/09/2008  . Essential hypertension 03/09/2008  . FATIGUE 03/09/2008  . PROSTATE CANCER, HX OF 03/09/2008    Past Surgical History:  Procedure Laterality Date  . AV FISTULA PLACEMENT Left 01/24/2013   Procedure: INSERTION OF ARTERIOVENOUS (AV) GORE-TEX GRAFT ARM;  Surgeon: Rosetta Posner, MD;  Location: [place];  Service: Vascular;  Laterality: Left;  . COLONOSCOPY N/A 06/17/2015   Procedure: COLONOSCOPY;  Surgeon: Gatha Mayer, MD;  Location: [place];  Service: Endoscopy;  Laterality: N/A;  . ESOPHAGOGASTRODUODENOSCOPY N/A 06/17/2015   Procedure: ESOPHAGOGASTRODUODENOSCOPY (EGD);  Surgeon: Gatha Mayer, MD;  Location: [facility] ENDOSCOPY;  Service: Endoscopy;  Laterality: N/A;  . FLEXIBLE SIGMOIDOSCOPY N/A 06/15/2015   Procedure: FLEXIBLE SIGMOIDOSCOPY;  Surgeon: Gatha Mayer, MD;  Location: [place];  Service: Endoscopy;  Laterality: N/A;  . GIVENS CAPSULE STUDY N/A 07/06/2015   Procedure: GIVENS CAPSULE STUDY;  Surgeon: Milus Banister, MD;  Location: [place];  Service: Endoscopy;  Laterality: N/A;  . JOINT REPLACEMENT     HIP  . rotator cuff repair right  4/08  . s/p left hip replacement  2007   Dr. Percell Miller ortho  . TONSILLECTOMY         Home Medications  Prior to Admission medications   Medication Sig Start Date End Date Taking? Authorizing Provider  aspirin EC 81 MG tablet Take 1 tablet (81 mg total) by mouth daily. 12/12/16  Yes Rosalin Hawking, MD  citalopram (CELEXA) 10 MG tablet Take 1 tablet (10 mg total) by mouth daily. 08/07/16  Yes [name], MD  nystatin (MYCOSTATIN/NYSTOP) powder Use as directed twice per day Patient taking differently: Apply 1 g topically 2 (two) times daily as needed (rash).  07/06/16  Yes [name], MD  rosuvastatin (CRESTOR) 20 MG tablet  TAKE 1 TABLET AT BEDTIME 02/12/17  Yes [name], MD  allopurinol (ZYLOPRIM) 100 MG tablet TAKE 1 TABLET DAILY 08/07/16   [name], MD  calcitRIOL (ROCALTROL) 0.5 MCG capsule Take 2 capsules (1 mcg total) by mouth every Monday, Wednesday, and Friday with hemodialysis. 10/06/16   Doreatha Lew, MD  docusate sodium (COLACE) 100 MG capsule Take 2 capsules (200 mg total) by mouth 2 (two) times daily. 06/22/15   Thurnell Lose, MD  doxercalciferol (HECTOROL) 0.5 MCG capsule Take 1.5 mcg by mouth 3 (three) times a week. MWF    [provider]  midodrine (PROAMATINE) 5 MG tablet Take 1 tablet (5 mg total) by mouth 3 (three) times daily. Take in the morning, lunch time and 4pm. Do not take after 4pm. Patient taking differently: Take 5 mg by mouth 3 (three) times daily as needed (low blood pressure). Take in the morning, lunch time and 4pm. Do not take after 4pm. 09/07/16   Rosalin Hawking, MD  Multiple Vitamin (MULTIVITAMIN WITH MINERALS) TABS tablet Take 1 tablet by mouth daily.    [provider]    Family History Family History  Problem Relation Age of Onset  . Hypertension Father   . Diabetes Father   . Cancer Father        prostate cancer  . Hypertension Brother   . Cancer Brother     Social History Social History  Substance Use Topics  . Smoking status: Former Smoker    Years: 5.00    Types: Cigarettes    Quit date: 09/17/1982  . Smokeless tobacco: Never Used  . Alcohol use No     Comment: rare     Allergies   Patient has no known allergies.   Review of Systems Review of Systems  Constitutional: Negative for chills and fever.  Respiratory: Negative for cough, chest tightness and shortness of breath.   Cardiovascular: Negative for chest pain, palpitations and leg swelling.  Gastrointestinal: Negative for abdominal distention, abdominal pain, diarrhea, nausea and vomiting.  Genitourinary: Negative for dysuria, frequency, hematuria and urgency.    Musculoskeletal: Positive for arthralgias and joint swelling. Negative for myalgias, neck pain and neck stiffness.  Skin: Negative for rash.  Allergic/Immunologic: Negative for immunocompromised state.  Neurological: Positive for headaches. Negative for dizziness, weakness, light-headedness and numbness.  All other systems reviewed and are negative.    Physical Exam Updated Vital Signs BP (!) 116/59 (BP Location: Right Arm)   Pulse 87   Temp (!) 97.5 F (36.4 C) (Oral)   Resp 14   SpO2 100%   Physical Exam  Constitutional: He is oriented to person, place, and time. He appears well-developed and well-nourished. No distress.  HENT:  Head: Normocephalic.  Mouth/Throat: Oropharynx is clear and moist.  Abrasion to the mid forehead, hemostatic. No hemotympanum bilaterally  Eyes: Conjunctivae are normal.  Neck: Normal range of motion. Neck supple.  Cardiovascular: Normal  rate, regular rhythm and normal heart sounds.   Pulmonary/Chest: Effort normal. No respiratory distress. He has no wheezes. He has no rales.  Abdominal: Soft. Bowel sounds are normal. He exhibits no distension. There is no tenderness. There is no rebound.  Musculoskeletal: He exhibits no edema.  Swelling noted to the anterior left knee. Pain with range of motion, limited range of motion due to pain. Joint is stable with negative anterior posterior drawer signs. No laxity with medial or lateral stress. DP pulses intact  Neurological: He is alert and oriented to person, place, and time.  5/5 and equal bilateral upper and lower extremity strength bilaterally  Skin: Skin is warm and dry.  Nursing note and vitals reviewed.    ED Treatments / Results  Labs (all labs ordered are listed, but only abnormal results are displayed) Labs Reviewed - No data to display  EKG  EKG Interpretation None       Radiology Ct Head Wo Contrast  Result Date: 08/17/2017 CLINICAL DATA:  Headache after falling EXAM: CT HEAD  WITHOUT CONTRAST CT CERVICAL SPINE WITHOUT CONTRAST TECHNIQUE: Multidetector CT imaging of the head and cervical spine was performed following the standard protocol without intravenous contrast. Multiplanar CT image reconstructions of the cervical spine were also generated. COMPARISON:  Brain MRI 07/05/2015 and 06/15/2015 FINDINGS: CT HEAD FINDINGS Brain: No mass lesion, intraparenchymal hemorrhage or extra-axial collection. No evidence of acute cortical infarct. There is periventricular hypoattenuation compatible with chronic microvascular disease. Vascular: No hyperdense vessel or unexpected calcification. Skull: Normal visualized skull base, calvarium and extracranial soft tissues. Sinuses/Orbits: No sinus fluid levels or advanced mucosal thickening. No mastoid effusion. Normal orbits. CT CERVICAL SPINE FINDINGS Alignment: There is widening of the atlantodental interval, measuring 4.5 mm (normal for adults is less than 2.5 mm). There is slight anterior subluxation of the right C1 lateral mass relative C2. The dens remains below the [place] line between the hard palate and opisthion. Facet articulations remain normal at C2-C3. The left-sided C1-C2 articulation is normal. The lateral masses of C1 are normally aligned with the occipital condyles. There is grade 1 retrolisthesis at C3-C4 and grade 1 anterolisthesis at C4-C5 and C5-C6. Skull base and vertebrae: No acute fracture. Soft tissues and spinal canal: No prevertebral fluid or swelling. No visible canal hematoma. Disc levels: Severe disc space loss at all levels. No bony spinal canal stenosis. Severe foraminal stenosis at right C3-C4 and C6-C7. On the left, foraminal stenosis is worst at C7-T1. Upper chest: No pneumothorax, pulmonary nodule or pleural effusion. Other: Normal visualized paraspinal cervical soft tissues. IMPRESSION: 1. Chronic microvascular ischemia without acute intracranial abnormality. 2. Widening of the atlantodental interval and mild  anterior subluxation of the right C1-C2 articulation. This has the appearance of age indeterminate rotatory subluxation. The other are articulations between the skull base, C1 and C2 are normal. Compared to the MRI of 06/15/15, allowing for technical differences, the alignment is approximately the same. MRI of the cervical spine could be considered for further characterization if there is clinical suspicion of acute cervical spine injury. 3. No cervical spine fracture or prevertebral soft tissue swelling. 4. Severe cervical degenerative disc disease and facet arthrosis with severe neural foraminal stenosis at right C3-4, right C6-7 and left C7-T1. Electronically Signed   By: Ulyses Jarred M.D.   On: 08/17/2017 21:56   Ct Cervical Spine Wo Contrast  Result Date: 08/17/2017 CLINICAL DATA:  Headache after falling EXAM: CT HEAD WITHOUT CONTRAST CT CERVICAL SPINE WITHOUT CONTRAST TECHNIQUE: Multidetector CT  imaging of the head and cervical spine was performed following the standard protocol without intravenous contrast. Multiplanar CT image reconstructions of the cervical spine were also generated. COMPARISON:  Brain MRI 07/05/2015 and 06/15/2015 FINDINGS: CT HEAD FINDINGS Brain: No mass lesion, intraparenchymal hemorrhage or extra-axial collection. No evidence of acute cortical infarct. There is periventricular hypoattenuation compatible with chronic microvascular disease. Vascular: No hyperdense vessel or unexpected calcification. Skull: Normal visualized skull base, calvarium and extracranial soft tissues. Sinuses/Orbits: No sinus fluid levels or advanced mucosal thickening. No mastoid effusion. Normal orbits. CT CERVICAL SPINE FINDINGS Alignment: There is widening of the atlantodental interval, measuring 4.5 mm (normal for adults is less than 2.5 mm). There is slight anterior subluxation of the right C1 lateral mass relative C2. The dens remains below the [place] line between the hard palate and opisthion.  Facet articulations remain normal at C2-C3. The left-sided C1-C2 articulation is normal. The lateral masses of C1 are normally aligned with the occipital condyles. There is grade 1 retrolisthesis at C3-C4 and grade 1 anterolisthesis at C4-C5 and C5-C6. Skull base and vertebrae: No acute fracture. Soft tissues and spinal canal: No prevertebral fluid or swelling. No visible canal hematoma. Disc levels: Severe disc space loss at all levels. No bony spinal canal stenosis. Severe foraminal stenosis at right C3-C4 and C6-C7. On the left, foraminal stenosis is worst at C7-T1. Upper chest: No pneumothorax, pulmonary nodule or pleural effusion. Other: Normal visualized paraspinal cervical soft tissues. IMPRESSION: 1. Chronic microvascular ischemia without acute intracranial abnormality. 2. Widening of the atlantodental interval and mild anterior subluxation of the right C1-C2 articulation. This has the appearance of age indeterminate rotatory subluxation. The other are articulations between the skull base, C1 and C2 are normal. Compared to the MRI of 06/15/15, allowing for technical differences, the alignment is approximately the same. MRI of the cervical spine could be considered for further characterization if there is clinical suspicion of acute cervical spine injury. 3. No cervical spine fracture or prevertebral soft tissue swelling. 4. Severe cervical degenerative disc disease and facet arthrosis with severe neural foraminal stenosis at right C3-4, right C6-7 and left C7-T1. Electronically Signed   By: Ulyses Jarred M.D.   On: 08/17/2017 21:56   Dg Knee Complete 4 Views Left  Result Date: 08/17/2017 CLINICAL DATA:  Fall with knee pain EXAM: LEFT KNEE - COMPLETE 4+ VIEW COMPARISON:  None FINDINGS: There is no fracture or dislocation of the left knee. There is a large prepatellar effusion. Degenerative change is very mild for age. IMPRESSION: No fracture or dislocation of the left knee. Large prepatellar effusion.  Electronically Signed   By: Ulyses Jarred M.D.   On: 08/17/2017 17:54    Procedures Procedures (including critical care time)  Medications Ordered in ED Medications - No data to display   Initial Impression / Assessment and Plan / ED Course  I have reviewed the triage vital signs and the nursing notes.  Pertinent labs & imaging results that were available during my care of the patient were reviewed by me and considered in my medical decision making (see chart for details).     Patient in the emergency department with pain to the left knee after falling on it earlier today. Also has abrasion to the forehead and mild headache. Will get CT head and cervical spine for further evaluation next year the knee.   X-rays show a suprapatellar effusion, otherwise negative. Joint is stable. Will apply Ace wrap for compression and give ice pack. Head CT  is negative. Cervical CT showing degenerative disc disease and widening of atlantodental interval with mild anterior subluxation of the right C1-C2 articulation. Patient does not have any neck pain at this time, no midline tenderness. It is similar to prior MRI, I do not think he needs any further emergent imaging at this time. He has full range of motion and strength in bilateral extremities. Plan to discharge home with close outpatient follow-up.  Vitals:   08/17/17 1641 08/17/17 2045 08/17/17 2241  BP: (!) 116/59 (!) 156/56 (!) 145/60  Pulse: 87 80   Resp: 14  16  Temp: (!) 97.5 F (36.4 C)  98 F (36.7 C)  TempSrc: Oral  Oral  SpO2: 100% 97% 98%     Final Clinical Impressions(s) / ED Diagnoses   Final diagnoses:  Contusion of face, initial encounter  Fall, initial encounter  Contusion of left knee, initial encounter    New Prescriptions Discharge Medication List as of 08/17/2017 10:20 PM       Jeannett Senior, PA-C 08/18/17 0006    Jola Schmidt, MD 08/18/17 [id number]

## 2017-08-17 NOTE — ED Triage Notes (Signed)
Pt reports falling over a carpet at [facility] when walking out of the bathroom, pt has 2 cm abrasion to forehead, pt c/o L knee pain with bruising, pt rcvd full HD today, ambulatory with cane to triage, pt A&o x4

## 2017-08-18 DIAGNOSIS — H40029 Open angle with borderline findings, high risk, unspecified eye: Secondary | ICD-10-CM | POA: Diagnosis not present

## 2017-08-20 DIAGNOSIS — N186 End stage renal disease: Secondary | ICD-10-CM | POA: Diagnosis not present

## 2017-08-20 DIAGNOSIS — N2581 Secondary hyperparathyroidism of renal origin: Secondary | ICD-10-CM | POA: Diagnosis not present

## 2017-08-20 DIAGNOSIS — D631 Anemia in chronic kidney disease: Secondary | ICD-10-CM | POA: Diagnosis not present

## 2017-08-21 ENCOUNTER — Telehealth: Payer: Self-pay | Admitting: Internal Medicine

## 2017-08-21 MED ORDER — ALLOPURINOL 100 MG PO TABS: 100.0000 mg | ORAL_TABLET | Freq: Every day | ORAL | 0 refills | Status: DC

## 2017-08-21 NOTE — Telephone Encounter (Signed)
Pt is needing a refill on allopurinol (ZYLOPRIM) 100 MG tablet to be sent to [facility].

## 2017-08-21 NOTE — Telephone Encounter (Signed)
Reviewed chart pt is up-to-date sent refills to mail service.../lmb  

## 2017-08-22 DIAGNOSIS — N186 End stage renal disease: Secondary | ICD-10-CM | POA: Diagnosis not present

## 2017-08-22 DIAGNOSIS — N2581 Secondary hyperparathyroidism of renal origin: Secondary | ICD-10-CM | POA: Diagnosis not present

## 2017-08-22 DIAGNOSIS — D631 Anemia in chronic kidney disease: Secondary | ICD-10-CM | POA: Diagnosis not present

## 2017-08-24 DIAGNOSIS — D631 Anemia in chronic kidney disease: Secondary | ICD-10-CM | POA: Diagnosis not present

## 2017-08-24 DIAGNOSIS — N186 End stage renal disease: Secondary | ICD-10-CM | POA: Diagnosis not present

## 2017-08-24 DIAGNOSIS — N2581 Secondary hyperparathyroidism of renal origin: Secondary | ICD-10-CM | POA: Diagnosis not present

## 2017-08-27 DIAGNOSIS — N186 End stage renal disease: Secondary | ICD-10-CM | POA: Diagnosis not present

## 2017-08-27 DIAGNOSIS — D631 Anemia in chronic kidney disease: Secondary | ICD-10-CM | POA: Diagnosis not present

## 2017-08-27 DIAGNOSIS — N2581 Secondary hyperparathyroidism of renal origin: Secondary | ICD-10-CM | POA: Diagnosis not present

## 2017-08-29 DIAGNOSIS — N186 End stage renal disease: Secondary | ICD-10-CM | POA: Diagnosis not present

## 2017-08-29 DIAGNOSIS — D631 Anemia in chronic kidney disease: Secondary | ICD-10-CM | POA: Diagnosis not present

## 2017-08-29 DIAGNOSIS — N2581 Secondary hyperparathyroidism of renal origin: Secondary | ICD-10-CM | POA: Diagnosis not present

## 2017-08-31 DIAGNOSIS — N186 End stage renal disease: Secondary | ICD-10-CM | POA: Diagnosis not present

## 2017-08-31 DIAGNOSIS — D631 Anemia in chronic kidney disease: Secondary | ICD-10-CM | POA: Diagnosis not present

## 2017-08-31 DIAGNOSIS — N2581 Secondary hyperparathyroidism of renal origin: Secondary | ICD-10-CM | POA: Diagnosis not present

## 2017-09-03 DIAGNOSIS — N186 End stage renal disease: Secondary | ICD-10-CM | POA: Diagnosis not present

## 2017-09-03 DIAGNOSIS — N2581 Secondary hyperparathyroidism of renal origin: Secondary | ICD-10-CM | POA: Diagnosis not present

## 2017-09-03 DIAGNOSIS — D631 Anemia in chronic kidney disease: Secondary | ICD-10-CM | POA: Diagnosis not present

## 2017-09-05 DIAGNOSIS — D631 Anemia in chronic kidney disease: Secondary | ICD-10-CM | POA: Diagnosis not present

## 2017-09-05 DIAGNOSIS — N186 End stage renal disease: Secondary | ICD-10-CM | POA: Diagnosis not present

## 2017-09-05 DIAGNOSIS — I158 Other secondary hypertension: Secondary | ICD-10-CM | POA: Diagnosis not present

## 2017-09-05 DIAGNOSIS — N2581 Secondary hyperparathyroidism of renal origin: Secondary | ICD-10-CM | POA: Diagnosis not present

## 2017-09-05 DIAGNOSIS — Z992 Dependence on renal dialysis: Secondary | ICD-10-CM | POA: Diagnosis not present

## 2017-09-07 DIAGNOSIS — D631 Anemia in chronic kidney disease: Secondary | ICD-10-CM | POA: Diagnosis not present

## 2017-09-07 DIAGNOSIS — N2581 Secondary hyperparathyroidism of renal origin: Secondary | ICD-10-CM | POA: Diagnosis not present

## 2017-09-07 DIAGNOSIS — N186 End stage renal disease: Secondary | ICD-10-CM | POA: Diagnosis not present

## 2017-09-10 DIAGNOSIS — D631 Anemia in chronic kidney disease: Secondary | ICD-10-CM | POA: Diagnosis not present

## 2017-09-10 DIAGNOSIS — N2581 Secondary hyperparathyroidism of renal origin: Secondary | ICD-10-CM | POA: Diagnosis not present

## 2017-09-10 DIAGNOSIS — N186 End stage renal disease: Secondary | ICD-10-CM | POA: Diagnosis not present

## 2017-09-12 DIAGNOSIS — N186 End stage renal disease: Secondary | ICD-10-CM | POA: Diagnosis not present

## 2017-09-12 DIAGNOSIS — N2581 Secondary hyperparathyroidism of renal origin: Secondary | ICD-10-CM | POA: Diagnosis not present

## 2017-09-12 DIAGNOSIS — D631 Anemia in chronic kidney disease: Secondary | ICD-10-CM | POA: Diagnosis not present

## 2017-09-14 DIAGNOSIS — D631 Anemia in chronic kidney disease: Secondary | ICD-10-CM | POA: Diagnosis not present

## 2017-09-14 DIAGNOSIS — N186 End stage renal disease: Secondary | ICD-10-CM | POA: Diagnosis not present

## 2017-09-14 DIAGNOSIS — N2581 Secondary hyperparathyroidism of renal origin: Secondary | ICD-10-CM | POA: Diagnosis not present

## 2017-09-17 DIAGNOSIS — N2581 Secondary hyperparathyroidism of renal origin: Secondary | ICD-10-CM | POA: Diagnosis not present

## 2017-09-17 DIAGNOSIS — N186 End stage renal disease: Secondary | ICD-10-CM | POA: Diagnosis not present

## 2017-09-17 DIAGNOSIS — D631 Anemia in chronic kidney disease: Secondary | ICD-10-CM | POA: Diagnosis not present

## 2017-09-19 DIAGNOSIS — N186 End stage renal disease: Secondary | ICD-10-CM | POA: Diagnosis not present

## 2017-09-19 DIAGNOSIS — N2581 Secondary hyperparathyroidism of renal origin: Secondary | ICD-10-CM | POA: Diagnosis not present

## 2017-09-19 DIAGNOSIS — D631 Anemia in chronic kidney disease: Secondary | ICD-10-CM | POA: Diagnosis not present

## 2017-09-21 DIAGNOSIS — N186 End stage renal disease: Secondary | ICD-10-CM | POA: Diagnosis not present

## 2017-09-21 DIAGNOSIS — D631 Anemia in chronic kidney disease: Secondary | ICD-10-CM | POA: Diagnosis not present

## 2017-09-21 DIAGNOSIS — N2581 Secondary hyperparathyroidism of renal origin: Secondary | ICD-10-CM | POA: Diagnosis not present

## 2017-09-23 DIAGNOSIS — N186 End stage renal disease: Secondary | ICD-10-CM | POA: Diagnosis not present

## 2017-09-23 DIAGNOSIS — D631 Anemia in chronic kidney disease: Secondary | ICD-10-CM | POA: Diagnosis not present

## 2017-09-23 DIAGNOSIS — N2581 Secondary hyperparathyroidism of renal origin: Secondary | ICD-10-CM | POA: Diagnosis not present

## 2017-09-25 DIAGNOSIS — N186 End stage renal disease: Secondary | ICD-10-CM | POA: Diagnosis not present

## 2017-09-25 DIAGNOSIS — D631 Anemia in chronic kidney disease: Secondary | ICD-10-CM | POA: Diagnosis not present

## 2017-09-25 DIAGNOSIS — H2513 Age-related nuclear cataract, bilateral: Secondary | ICD-10-CM | POA: Diagnosis not present

## 2017-09-25 DIAGNOSIS — H401131 Primary open-angle glaucoma, bilateral, mild stage: Secondary | ICD-10-CM | POA: Diagnosis not present

## 2017-09-25 DIAGNOSIS — N2581 Secondary hyperparathyroidism of renal origin: Secondary | ICD-10-CM | POA: Diagnosis not present

## 2017-09-28 DIAGNOSIS — D631 Anemia in chronic kidney disease: Secondary | ICD-10-CM | POA: Diagnosis not present

## 2017-09-28 DIAGNOSIS — N2581 Secondary hyperparathyroidism of renal origin: Secondary | ICD-10-CM | POA: Diagnosis not present

## 2017-09-28 DIAGNOSIS — N186 End stage renal disease: Secondary | ICD-10-CM | POA: Diagnosis not present

## 2017-10-01 DIAGNOSIS — N2581 Secondary hyperparathyroidism of renal origin: Secondary | ICD-10-CM | POA: Diagnosis not present

## 2017-10-01 DIAGNOSIS — D631 Anemia in chronic kidney disease: Secondary | ICD-10-CM | POA: Diagnosis not present

## 2017-10-01 DIAGNOSIS — N186 End stage renal disease: Secondary | ICD-10-CM | POA: Diagnosis not present

## 2017-10-02 ENCOUNTER — Ambulatory Visit: Payer: Medicare Other | Admitting: Internal Medicine

## 2017-10-03 DIAGNOSIS — N2581 Secondary hyperparathyroidism of renal origin: Secondary | ICD-10-CM | POA: Diagnosis not present

## 2017-10-03 DIAGNOSIS — N186 End stage renal disease: Secondary | ICD-10-CM | POA: Diagnosis not present

## 2017-10-03 DIAGNOSIS — D631 Anemia in chronic kidney disease: Secondary | ICD-10-CM | POA: Diagnosis not present

## 2017-10-05 DIAGNOSIS — Z992 Dependence on renal dialysis: Secondary | ICD-10-CM | POA: Diagnosis not present

## 2017-10-05 DIAGNOSIS — N2581 Secondary hyperparathyroidism of renal origin: Secondary | ICD-10-CM | POA: Diagnosis not present

## 2017-10-05 DIAGNOSIS — N186 End stage renal disease: Secondary | ICD-10-CM | POA: Diagnosis not present

## 2017-10-05 DIAGNOSIS — D631 Anemia in chronic kidney disease: Secondary | ICD-10-CM | POA: Diagnosis not present

## 2017-10-05 DIAGNOSIS — I158 Other secondary hypertension: Secondary | ICD-10-CM | POA: Diagnosis not present

## 2017-10-08 ENCOUNTER — Other Ambulatory Visit (INDEPENDENT_AMBULATORY_CARE_PROVIDER_SITE_OTHER): Payer: Medicare Other

## 2017-10-08 ENCOUNTER — Ambulatory Visit (INDEPENDENT_AMBULATORY_CARE_PROVIDER_SITE_OTHER): Payer: Medicare Other | Admitting: Internal Medicine

## 2017-10-08 ENCOUNTER — Encounter: Payer: Self-pay | Admitting: Internal Medicine

## 2017-10-08 VITALS — BP 118/68 | HR 90 | Temp 97.8°F | Ht 69.0 in | Wt 154.0 lb

## 2017-10-08 DIAGNOSIS — D509 Iron deficiency anemia, unspecified: Secondary | ICD-10-CM | POA: Diagnosis not present

## 2017-10-08 DIAGNOSIS — I10 Essential (primary) hypertension: Secondary | ICD-10-CM | POA: Diagnosis not present

## 2017-10-08 DIAGNOSIS — E785 Hyperlipidemia, unspecified: Secondary | ICD-10-CM

## 2017-10-08 DIAGNOSIS — N186 End stage renal disease: Secondary | ICD-10-CM | POA: Diagnosis not present

## 2017-10-08 DIAGNOSIS — N2581 Secondary hyperparathyroidism of renal origin: Secondary | ICD-10-CM | POA: Diagnosis not present

## 2017-10-08 DIAGNOSIS — D631 Anemia in chronic kidney disease: Secondary | ICD-10-CM | POA: Diagnosis not present

## 2017-10-08 DIAGNOSIS — I63311 Cerebral infarction due to thrombosis of right middle cerebral artery: Secondary | ICD-10-CM

## 2017-10-08 DIAGNOSIS — I951 Orthostatic hypotension: Secondary | ICD-10-CM

## 2017-10-08 LAB — TSH: TSH: 1.7 u[IU]/mL (ref 0.35–4.50)

## 2017-10-08 MED ORDER — ROSUVASTATIN CALCIUM 20 MG PO TABS: 20.0000 mg | ORAL_TABLET | Freq: Every day | ORAL | 3 refills | Status: DC

## 2017-10-08 MED ORDER — CALCITRIOL 0.5 MCG PO CAPS: 1.0000 ug | ORAL_CAPSULE | ORAL | 3 refills | Status: DC

## 2017-10-08 MED ORDER — CITALOPRAM HYDROBROMIDE 10 MG PO TABS: 10.0000 mg | ORAL_TABLET | Freq: Every day | ORAL | 3 refills | Status: DC

## 2017-10-08 MED ORDER — MIDODRINE HCL 5 MG PO TABS: 5.0000 mg | ORAL_TABLET | Freq: Three times a day (TID) | ORAL | 0 refills | Status: DC

## 2017-10-08 MED ORDER — ALLOPURINOL 100 MG PO TABS: 100.0000 mg | ORAL_TABLET | Freq: Every day | ORAL | 3 refills | Status: DC

## 2017-10-08 MED ORDER — DOXERCALCIFEROL 0.5 MCG PO CAPS: 1.5000 ug | ORAL_CAPSULE | ORAL | 3 refills | Status: DC

## 2017-10-08 MED ORDER — MIDODRINE HCL 5 MG PO TABS: 5.0000 mg | ORAL_TABLET | Freq: Three times a day (TID) | ORAL | 3 refills | Status: DC

## 2017-10-08 NOTE — Assessment & Plan Note (Signed)
To continue midodrine,  to f/u any worsening symptoms or concerns

## 2017-10-08 NOTE — Assessment & Plan Note (Addendum)
Lab Results  Component Value Date   LDLCALC 124 (H) 03/27/2017  Declines statin change, to cont crestor 20,  for lower chol diet,  to f/u any worsening symptoms or concerns

## 2017-10-08 NOTE — Assessment & Plan Note (Signed)
stable overall by history and exam, recent data reviewed with pt, and pt to continue medical treatment as before,  to f/u any worsening symptoms or concerns, for f/u lab 

## 2017-10-08 NOTE — Patient Instructions (Addendum)
Your right ear was irrigated of wax  Please continue all other medications as before, and refills have been done if requested with the midrodrine done locally as well as [facility]  Please have the pharmacy call with any other refills you may need.  Please continue your efforts at being more active, low cholesterol diet, and weight control.  You are otherwise up to date with prevention measures today.  Please keep your appointments with your specialists as you may have planned  Please go to the LAB in the Basement (turn left off the elevator) for the tests to be done today  You will be contacted by phone if any changes need to be made immediately.  Otherwise, you will receive a letter about your results with an explanation, but please check with MyChart first.  Please remember to sign up for MyChart if you have not done so, as this will be important to you in the future with finding out test results, communicating by private email, and scheduling acute appointments online when needed.  Please return in 1 year for your yearly visit, or sooner if needed

## 2017-10-08 NOTE — Progress Notes (Signed)
Subjective:    Patient ID: [name], male    DOB: 10-Apr-1929, 83 y.o.   MRN: [id number]  HPI   Here for yearly f/u;  Overall doing ok;  Pt denies Chest pain, worsening SOB, DOE, wheezing, orthopnea, PND, worsening LE edema, palpitations, dizziness or syncope.  Pt denies neurological change such as new headache, facial or extremity weakness.  Pt denies polydipsia, polyuria, or low sugar symptoms. Pt states overall good compliance with treatment and medications, good tolerability, and has been trying to follow appropriate diet.  Pt denies worsening depressive symptoms, suicidal ideation or panic. No fever, night sweats, wt loss, loss of appetite, or other constitutional symptoms.  Pt states good ability with ADL's, has low fall risk, home safety reviewed and adequate, no other significant changes in hearing or vision, and only occasionally active with exercise.  Declines flu shot and prevnar.  Does urinate once per day,  Still with HD on M-w-f .  No new complaints or interval hx Past Medical History:  Diagnosis Date  . ANEMIA-IRON DEFICIENCY 03/09/2008  . Arthritis    cervical spine.   . Complex renal cyst 06/19/2011  . Depression 03/26/2014  . DIVERTICULOSIS, COLON 03/09/2008  . ESRD (end stage renal disease) ([place]) 03/09/2008  . Flu 12/08/2016  . GOUT 03/09/2008  . Hemorrhoids 08/2009   internal.   . History of blood transfusion   . HYPERLIPIDEMIA 03/09/2008  . Hyperparathyroidism ([place])   . HYPERTENSION 03/09/2008  . Prostate cancer [facility]) 2002   Completed external beam radiation 2003.per HPI  . PROSTATE CANCER, HX OF 03/09/2008  . Radiation cystitis 2010.  Marland Kitchen Stroke [facility])    Past Surgical History:  Procedure Laterality Date  . AV FISTULA PLACEMENT Left 01/24/2013   Procedure: INSERTION OF ARTERIOVENOUS (AV) GORE-TEX GRAFT ARM;  Surgeon: Rosetta Posner, MD;  Location: [place];  Service: Vascular;  Laterality: Left;  . COLONOSCOPY N/A 06/17/2015   Procedure: COLONOSCOPY;  Surgeon: Gatha Mayer, MD;   Location: [place];  Service: Endoscopy;  Laterality: N/A;  . ESOPHAGOGASTRODUODENOSCOPY N/A 06/17/2015   Procedure: ESOPHAGOGASTRODUODENOSCOPY (EGD);  Surgeon: Gatha Mayer, MD;  Location: [facility] ENDOSCOPY;  Service: Endoscopy;  Laterality: N/A;  . FLEXIBLE SIGMOIDOSCOPY N/A 06/15/2015   Procedure: FLEXIBLE SIGMOIDOSCOPY;  Surgeon: Gatha Mayer, MD;  Location: [place];  Service: Endoscopy;  Laterality: N/A;  . GIVENS CAPSULE STUDY N/A 07/06/2015   Procedure: GIVENS CAPSULE STUDY;  Surgeon: Milus Banister, MD;  Location: [place];  Service: Endoscopy;  Laterality: N/A;  . JOINT REPLACEMENT     HIP  . rotator cuff repair right  4/08  . s/p left hip replacement  2007   Dr. Percell Miller ortho  . TONSILLECTOMY      reports that he quit smoking about 35 years ago. His smoking use included cigarettes. He quit after 5.00 years of use. he has never used smokeless tobacco. He reports that he does not drink alcohol or use drugs. family history includes Cancer in his brother and father; Diabetes in his father; Hypertension in his brother and father. No Known Allergies Current Outpatient Medications on File Prior to Visit  Medication Sig Dispense Refill  . aspirin EC 81 MG tablet Take 1 tablet (81 mg total) by mouth daily.    Marland Kitchen docusate sodium (COLACE) 100 MG capsule Take 2 capsules (200 mg total) by mouth 2 (two) times daily. 60 capsule 0  . Multiple Vitamin (MULTIVITAMIN WITH MINERALS) TABS tablet Take 1 tablet by mouth daily.    Marland Kitchen  nystatin (MYCOSTATIN/NYSTOP) powder Use as directed twice per day (Patient taking differently: Apply 1 g topically 2 (two) times daily as needed (rash). ) 45 g 1   No current facility-administered medications on file prior to visit.    Review of Systems  Constitutional: Negative for other unusual diaphoresis or sweats HENT: Negative for ear discharge or swelling Eyes: Negative for other worsening visual disturbances Respiratory: Negative for stridor or other  swelling  Gastrointestinal: Negative for worsening distension or other blood Genitourinary: Negative for retention or other urinary change Musculoskeletal: Negative for other MSK pain or swelling Skin: Negative for color change or other new lesions Neurological: Negative for worsening tremors and other numbness  Psychiatric/Behavioral: Negative for worsening agitation or other fatigue All other system neg per pt    Objective:   Physical Exam BP 118/68   Pulse 90   Temp 97.8 F (36.6 C) (Oral)   Ht 5\' 9"  (1.753 m)   Wt 154 lb (69.9 kg)   SpO2 98%   BMI 22.74 kg/m  VS noted,  Constitutional: Pt is oriented to person, place, and time. Appears well-developed and well-nourished, in no significant distress and comfortable Head: Normocephalic and atraumatic  Eyes: Conjunctivae and EOM are normal. Pupils are equal, round, and reactive to light Right Ear: External ear normal without discharge Left Ear: External ear normal without discharge Nose: Nose without discharge or deformity Mouth/Throat: Oropharynx is without other ulcerations and moist  Neck: Normal range of motion. Neck supple. No JVD present. No tracheal deviation present or significant neck LA or mass Cardiovascular: Normal rate, regular rhythm, normal heart sounds and intact distal pulses.   Pulmonary/Chest: WOB normal and breath sounds without rales or wheezing  Abdominal: Soft. Bowel sounds are normal. NT. No HSM  Musculoskeletal: Normal range of motion. Exhibits no edema Lymphadenopathy: Has no other cervical adenopathy.  Neurological: Pt is alert and oriented to person, place, and time. Pt has normal reflexes. No cranial nerve deficit. Motor grossly intact, Gait intact Skin: Skin is warm and dry. No rash noted or new ulcerations Psychiatric:  Has normal mood and affect. Behavior is normal without agitation No other exam findings    Assessment & Plan:

## 2017-10-08 NOTE — Assessment & Plan Note (Signed)
With recent lower  BP on midodrine, stable overall by history and exam, recent data reviewed with pt, and pt to continue medical treatment as before,  to f/u any worsening symptoms or concerns

## 2017-10-09 ENCOUNTER — Encounter: Payer: Self-pay | Admitting: Internal Medicine

## 2017-10-09 DIAGNOSIS — C61 Malignant neoplasm of prostate: Secondary | ICD-10-CM | POA: Diagnosis not present

## 2017-10-09 LAB — CBC WITH DIFFERENTIAL/PLATELET
BASOS ABS: 0.1 10*3/uL (ref 0.0–0.1)
BASOS PCT: 0.9 % (ref 0.0–3.0)
EOS ABS: 0.2 10*3/uL (ref 0.0–0.7)
Eosinophils Relative: 3 % (ref 0.0–5.0)
HCT: 36.7 % — ABNORMAL LOW (ref 39.0–52.0)
Hemoglobin: 12 g/dL — ABNORMAL LOW (ref 13.0–17.0)
LYMPHS ABS: 1.1 10*3/uL (ref 0.7–4.0)
LYMPHS PCT: 17.8 % (ref 12.0–46.0)
MCHC: 32.7 g/dL (ref 30.0–36.0)
MCV: 98.2 fl (ref 78.0–100.0)
MONO ABS: 1.1 10*3/uL — AB (ref 0.1–1.0)
Monocytes Relative: 18.3 % — ABNORMAL HIGH (ref 3.0–12.0)
NEUTROS ABS: 3.7 10*3/uL (ref 1.4–7.7)
NEUTROS PCT: 60 % (ref 43.0–77.0)
PLATELETS: 229 10*3/uL (ref 150.0–400.0)
RBC: 3.73 Mil/uL — ABNORMAL LOW (ref 4.22–5.81)
RDW: 17.9 % — AB (ref 11.5–15.5)
WBC: 6.2 10*3/uL (ref 4.0–10.5)

## 2017-10-09 LAB — HEPATIC FUNCTION PANEL
ALT: 8 U/L (ref 0–53)
AST: 15 U/L (ref 0–37)
Albumin: 4.9 g/dL (ref 3.5–5.2)
Alkaline Phosphatase: 55 U/L (ref 39–117)
Bilirubin, Direct: 0.1 mg/dL (ref 0.0–0.3)
TOTAL PROTEIN: 8 g/dL (ref 6.0–8.3)
Total Bilirubin: 0.6 mg/dL (ref 0.2–1.2)

## 2017-10-09 LAB — BASIC METABOLIC PANEL
BUN: 21 mg/dL (ref 6–23)
CALCIUM: 10.2 mg/dL (ref 8.4–10.5)
CHLORIDE: 90 meq/L — AB (ref 96–112)
CO2: 34 meq/L — AB (ref 19–32)
CREATININE: 6.32 mg/dL — AB (ref 0.40–1.50)
GFR: 10.85 mL/min — CL (ref >60.00)
Glucose, Bld: 98 mg/dL (ref 70–99)
Potassium: 4.2 mEq/L (ref 3.5–5.1)
Sodium: 139 mEq/L (ref 135–145)

## 2017-10-09 LAB — LIPID PANEL
CHOL/HDL RATIO: 3
Cholesterol: 267 mg/dL — ABNORMAL HIGH (ref 0–200)
HDL: 93.4 mg/dL (ref >39.00)
LDL Cholesterol: 160 mg/dL — ABNORMAL HIGH (ref 0–99)
NONHDL: 173.39
TRIGLYCERIDES: 66 mg/dL (ref 0.0–149.0)
VLDL: 13.2 mg/dL (ref 0.0–40.0)

## 2017-10-10 DIAGNOSIS — N2581 Secondary hyperparathyroidism of renal origin: Secondary | ICD-10-CM | POA: Diagnosis not present

## 2017-10-10 DIAGNOSIS — N186 End stage renal disease: Secondary | ICD-10-CM | POA: Diagnosis not present

## 2017-10-10 DIAGNOSIS — D631 Anemia in chronic kidney disease: Secondary | ICD-10-CM | POA: Diagnosis not present

## 2017-10-12 DIAGNOSIS — N186 End stage renal disease: Secondary | ICD-10-CM | POA: Diagnosis not present

## 2017-10-12 DIAGNOSIS — D631 Anemia in chronic kidney disease: Secondary | ICD-10-CM | POA: Diagnosis not present

## 2017-10-12 DIAGNOSIS — N2581 Secondary hyperparathyroidism of renal origin: Secondary | ICD-10-CM | POA: Diagnosis not present

## 2017-10-16 DIAGNOSIS — N2581 Secondary hyperparathyroidism of renal origin: Secondary | ICD-10-CM | POA: Diagnosis not present

## 2017-10-16 DIAGNOSIS — N186 End stage renal disease: Secondary | ICD-10-CM | POA: Diagnosis not present

## 2017-10-16 DIAGNOSIS — D631 Anemia in chronic kidney disease: Secondary | ICD-10-CM | POA: Diagnosis not present

## 2017-10-17 DIAGNOSIS — N186 End stage renal disease: Secondary | ICD-10-CM | POA: Diagnosis not present

## 2017-10-17 DIAGNOSIS — D631 Anemia in chronic kidney disease: Secondary | ICD-10-CM | POA: Diagnosis not present

## 2017-10-17 DIAGNOSIS — N2581 Secondary hyperparathyroidism of renal origin: Secondary | ICD-10-CM | POA: Diagnosis not present

## 2017-10-18 DIAGNOSIS — R9721 Rising PSA following treatment for malignant neoplasm of prostate: Secondary | ICD-10-CM | POA: Diagnosis not present

## 2017-10-18 DIAGNOSIS — C61 Malignant neoplasm of prostate: Secondary | ICD-10-CM | POA: Diagnosis not present

## 2017-10-18 DIAGNOSIS — E291 Testicular hypofunction: Secondary | ICD-10-CM | POA: Diagnosis not present

## 2017-10-19 DIAGNOSIS — N186 End stage renal disease: Secondary | ICD-10-CM | POA: Diagnosis not present

## 2017-10-19 DIAGNOSIS — N2581 Secondary hyperparathyroidism of renal origin: Secondary | ICD-10-CM | POA: Diagnosis not present

## 2017-10-19 DIAGNOSIS — D631 Anemia in chronic kidney disease: Secondary | ICD-10-CM | POA: Diagnosis not present

## 2017-10-22 DIAGNOSIS — N186 End stage renal disease: Secondary | ICD-10-CM | POA: Diagnosis not present

## 2017-10-22 DIAGNOSIS — D631 Anemia in chronic kidney disease: Secondary | ICD-10-CM | POA: Diagnosis not present

## 2017-10-22 DIAGNOSIS — N2581 Secondary hyperparathyroidism of renal origin: Secondary | ICD-10-CM | POA: Diagnosis not present

## 2017-10-24 ENCOUNTER — Other Ambulatory Visit: Payer: Self-pay | Admitting: Nephrology

## 2017-10-24 DIAGNOSIS — N2581 Secondary hyperparathyroidism of renal origin: Secondary | ICD-10-CM | POA: Diagnosis not present

## 2017-10-24 DIAGNOSIS — N186 End stage renal disease: Secondary | ICD-10-CM | POA: Diagnosis not present

## 2017-10-24 DIAGNOSIS — D631 Anemia in chronic kidney disease: Secondary | ICD-10-CM | POA: Diagnosis not present

## 2017-10-24 NOTE — Progress Notes (Signed)
VDRA from pharmacy cancelled. He rec at HD.  Does not need home medications.

## 2017-10-26 DIAGNOSIS — D631 Anemia in chronic kidney disease: Secondary | ICD-10-CM | POA: Diagnosis not present

## 2017-10-26 DIAGNOSIS — N2581 Secondary hyperparathyroidism of renal origin: Secondary | ICD-10-CM | POA: Diagnosis not present

## 2017-10-26 DIAGNOSIS — N186 End stage renal disease: Secondary | ICD-10-CM | POA: Diagnosis not present

## 2017-10-28 DIAGNOSIS — N2581 Secondary hyperparathyroidism of renal origin: Secondary | ICD-10-CM | POA: Diagnosis not present

## 2017-10-28 DIAGNOSIS — N186 End stage renal disease: Secondary | ICD-10-CM | POA: Diagnosis not present

## 2017-10-28 DIAGNOSIS — D631 Anemia in chronic kidney disease: Secondary | ICD-10-CM | POA: Diagnosis not present

## 2017-10-31 DIAGNOSIS — N2581 Secondary hyperparathyroidism of renal origin: Secondary | ICD-10-CM | POA: Diagnosis not present

## 2017-10-31 DIAGNOSIS — D631 Anemia in chronic kidney disease: Secondary | ICD-10-CM | POA: Diagnosis not present

## 2017-10-31 DIAGNOSIS — N186 End stage renal disease: Secondary | ICD-10-CM | POA: Diagnosis not present

## 2017-11-02 DIAGNOSIS — D631 Anemia in chronic kidney disease: Secondary | ICD-10-CM | POA: Diagnosis not present

## 2017-11-02 DIAGNOSIS — N2581 Secondary hyperparathyroidism of renal origin: Secondary | ICD-10-CM | POA: Diagnosis not present

## 2017-11-02 DIAGNOSIS — N186 End stage renal disease: Secondary | ICD-10-CM | POA: Diagnosis not present

## 2017-11-04 DIAGNOSIS — N2581 Secondary hyperparathyroidism of renal origin: Secondary | ICD-10-CM | POA: Diagnosis not present

## 2017-11-04 DIAGNOSIS — N186 End stage renal disease: Secondary | ICD-10-CM | POA: Diagnosis not present

## 2017-11-04 DIAGNOSIS — D631 Anemia in chronic kidney disease: Secondary | ICD-10-CM | POA: Diagnosis not present

## 2017-11-05 ENCOUNTER — Other Ambulatory Visit: Payer: Self-pay | Admitting: Internal Medicine

## 2017-11-05 DIAGNOSIS — N186 End stage renal disease: Secondary | ICD-10-CM | POA: Diagnosis not present

## 2017-11-05 DIAGNOSIS — I158 Other secondary hypertension: Secondary | ICD-10-CM | POA: Diagnosis not present

## 2017-11-05 DIAGNOSIS — I951 Orthostatic hypotension: Secondary | ICD-10-CM

## 2017-11-05 DIAGNOSIS — Z992 Dependence on renal dialysis: Secondary | ICD-10-CM | POA: Diagnosis not present

## 2017-11-07 DIAGNOSIS — N2581 Secondary hyperparathyroidism of renal origin: Secondary | ICD-10-CM | POA: Diagnosis not present

## 2017-11-07 DIAGNOSIS — N186 End stage renal disease: Secondary | ICD-10-CM | POA: Diagnosis not present

## 2017-11-08 DIAGNOSIS — Z992 Dependence on renal dialysis: Secondary | ICD-10-CM | POA: Diagnosis not present

## 2017-11-08 DIAGNOSIS — T82858A Stenosis of vascular prosthetic devices, implants and grafts, initial encounter: Secondary | ICD-10-CM | POA: Diagnosis not present

## 2017-11-08 DIAGNOSIS — N186 End stage renal disease: Secondary | ICD-10-CM | POA: Diagnosis not present

## 2017-11-08 DIAGNOSIS — I871 Compression of vein: Secondary | ICD-10-CM | POA: Diagnosis not present

## 2017-11-09 DIAGNOSIS — N186 End stage renal disease: Secondary | ICD-10-CM | POA: Diagnosis not present

## 2017-11-09 DIAGNOSIS — N2581 Secondary hyperparathyroidism of renal origin: Secondary | ICD-10-CM | POA: Diagnosis not present

## 2017-11-12 DIAGNOSIS — N2581 Secondary hyperparathyroidism of renal origin: Secondary | ICD-10-CM | POA: Diagnosis not present

## 2017-11-12 DIAGNOSIS — N186 End stage renal disease: Secondary | ICD-10-CM | POA: Diagnosis not present

## 2017-11-13 DIAGNOSIS — H53001 Unspecified amblyopia, right eye: Secondary | ICD-10-CM | POA: Diagnosis not present

## 2017-11-13 DIAGNOSIS — H2513 Age-related nuclear cataract, bilateral: Secondary | ICD-10-CM | POA: Diagnosis not present

## 2017-11-13 DIAGNOSIS — H401131 Primary open-angle glaucoma, bilateral, mild stage: Secondary | ICD-10-CM | POA: Diagnosis not present

## 2017-11-14 DIAGNOSIS — N2581 Secondary hyperparathyroidism of renal origin: Secondary | ICD-10-CM | POA: Diagnosis not present

## 2017-11-14 DIAGNOSIS — N186 End stage renal disease: Secondary | ICD-10-CM | POA: Diagnosis not present

## 2017-11-16 DIAGNOSIS — N2581 Secondary hyperparathyroidism of renal origin: Secondary | ICD-10-CM | POA: Diagnosis not present

## 2017-11-16 DIAGNOSIS — N186 End stage renal disease: Secondary | ICD-10-CM | POA: Diagnosis not present

## 2017-11-19 DIAGNOSIS — N186 End stage renal disease: Secondary | ICD-10-CM | POA: Diagnosis not present

## 2017-11-19 DIAGNOSIS — N2581 Secondary hyperparathyroidism of renal origin: Secondary | ICD-10-CM | POA: Diagnosis not present

## 2017-11-21 DIAGNOSIS — N186 End stage renal disease: Secondary | ICD-10-CM | POA: Diagnosis not present

## 2017-11-21 DIAGNOSIS — N2581 Secondary hyperparathyroidism of renal origin: Secondary | ICD-10-CM | POA: Diagnosis not present

## 2017-11-23 DIAGNOSIS — N2581 Secondary hyperparathyroidism of renal origin: Secondary | ICD-10-CM | POA: Diagnosis not present

## 2017-11-23 DIAGNOSIS — N186 End stage renal disease: Secondary | ICD-10-CM | POA: Diagnosis not present

## 2017-11-26 DIAGNOSIS — N2581 Secondary hyperparathyroidism of renal origin: Secondary | ICD-10-CM | POA: Diagnosis not present

## 2017-11-26 DIAGNOSIS — N186 End stage renal disease: Secondary | ICD-10-CM | POA: Diagnosis not present

## 2017-11-28 DIAGNOSIS — N2581 Secondary hyperparathyroidism of renal origin: Secondary | ICD-10-CM | POA: Diagnosis not present

## 2017-11-28 DIAGNOSIS — N186 End stage renal disease: Secondary | ICD-10-CM | POA: Diagnosis not present

## 2017-11-30 DIAGNOSIS — N186 End stage renal disease: Secondary | ICD-10-CM | POA: Diagnosis not present

## 2017-11-30 DIAGNOSIS — N2581 Secondary hyperparathyroidism of renal origin: Secondary | ICD-10-CM | POA: Diagnosis not present

## 2017-12-03 DIAGNOSIS — N2581 Secondary hyperparathyroidism of renal origin: Secondary | ICD-10-CM | POA: Diagnosis not present

## 2017-12-03 DIAGNOSIS — N186 End stage renal disease: Secondary | ICD-10-CM | POA: Diagnosis not present

## 2017-12-04 DIAGNOSIS — N2581 Secondary hyperparathyroidism of renal origin: Secondary | ICD-10-CM | POA: Diagnosis not present

## 2017-12-04 DIAGNOSIS — N186 End stage renal disease: Secondary | ICD-10-CM | POA: Diagnosis not present

## 2017-12-05 DIAGNOSIS — H401111 Primary open-angle glaucoma, right eye, mild stage: Secondary | ICD-10-CM | POA: Diagnosis not present

## 2017-12-05 DIAGNOSIS — Z01818 Encounter for other preprocedural examination: Secondary | ICD-10-CM | POA: Diagnosis not present

## 2017-12-05 DIAGNOSIS — H25811 Combined forms of age-related cataract, right eye: Secondary | ICD-10-CM | POA: Diagnosis not present

## 2017-12-05 DIAGNOSIS — H2511 Age-related nuclear cataract, right eye: Secondary | ICD-10-CM | POA: Diagnosis not present

## 2017-12-05 DIAGNOSIS — H409 Unspecified glaucoma: Secondary | ICD-10-CM | POA: Diagnosis not present

## 2017-12-06 DIAGNOSIS — N186 End stage renal disease: Secondary | ICD-10-CM | POA: Diagnosis not present

## 2017-12-06 DIAGNOSIS — Z992 Dependence on renal dialysis: Secondary | ICD-10-CM | POA: Diagnosis not present

## 2017-12-06 DIAGNOSIS — I158 Other secondary hypertension: Secondary | ICD-10-CM | POA: Diagnosis not present

## 2017-12-07 DIAGNOSIS — N2581 Secondary hyperparathyroidism of renal origin: Secondary | ICD-10-CM | POA: Diagnosis not present

## 2017-12-07 DIAGNOSIS — N186 End stage renal disease: Secondary | ICD-10-CM | POA: Diagnosis not present

## 2017-12-07 DIAGNOSIS — I158 Other secondary hypertension: Secondary | ICD-10-CM | POA: Diagnosis not present

## 2017-12-07 DIAGNOSIS — D509 Iron deficiency anemia, unspecified: Secondary | ICD-10-CM | POA: Diagnosis not present

## 2017-12-07 DIAGNOSIS — Z992 Dependence on renal dialysis: Secondary | ICD-10-CM | POA: Diagnosis not present

## 2017-12-07 DIAGNOSIS — D631 Anemia in chronic kidney disease: Secondary | ICD-10-CM | POA: Diagnosis not present

## 2017-12-10 DIAGNOSIS — N186 End stage renal disease: Secondary | ICD-10-CM | POA: Diagnosis not present

## 2017-12-10 DIAGNOSIS — D509 Iron deficiency anemia, unspecified: Secondary | ICD-10-CM | POA: Diagnosis not present

## 2017-12-10 DIAGNOSIS — D631 Anemia in chronic kidney disease: Secondary | ICD-10-CM | POA: Diagnosis not present

## 2017-12-10 DIAGNOSIS — N2581 Secondary hyperparathyroidism of renal origin: Secondary | ICD-10-CM | POA: Diagnosis not present

## 2017-12-12 DIAGNOSIS — N2581 Secondary hyperparathyroidism of renal origin: Secondary | ICD-10-CM | POA: Diagnosis not present

## 2017-12-12 DIAGNOSIS — N186 End stage renal disease: Secondary | ICD-10-CM | POA: Diagnosis not present

## 2017-12-12 DIAGNOSIS — D509 Iron deficiency anemia, unspecified: Secondary | ICD-10-CM | POA: Diagnosis not present

## 2017-12-12 DIAGNOSIS — D631 Anemia in chronic kidney disease: Secondary | ICD-10-CM | POA: Diagnosis not present

## 2017-12-14 DIAGNOSIS — N186 End stage renal disease: Secondary | ICD-10-CM | POA: Diagnosis not present

## 2017-12-14 DIAGNOSIS — D631 Anemia in chronic kidney disease: Secondary | ICD-10-CM | POA: Diagnosis not present

## 2017-12-14 DIAGNOSIS — N2581 Secondary hyperparathyroidism of renal origin: Secondary | ICD-10-CM | POA: Diagnosis not present

## 2017-12-14 DIAGNOSIS — D509 Iron deficiency anemia, unspecified: Secondary | ICD-10-CM | POA: Diagnosis not present

## 2017-12-17 DIAGNOSIS — N186 End stage renal disease: Secondary | ICD-10-CM | POA: Diagnosis not present

## 2017-12-17 DIAGNOSIS — N2581 Secondary hyperparathyroidism of renal origin: Secondary | ICD-10-CM | POA: Diagnosis not present

## 2017-12-17 DIAGNOSIS — D509 Iron deficiency anemia, unspecified: Secondary | ICD-10-CM | POA: Diagnosis not present

## 2017-12-17 DIAGNOSIS — D631 Anemia in chronic kidney disease: Secondary | ICD-10-CM | POA: Diagnosis not present

## 2017-12-18 DIAGNOSIS — L219 Seborrheic dermatitis, unspecified: Secondary | ICD-10-CM | POA: Diagnosis not present

## 2017-12-19 DIAGNOSIS — N2581 Secondary hyperparathyroidism of renal origin: Secondary | ICD-10-CM | POA: Diagnosis not present

## 2017-12-19 DIAGNOSIS — N186 End stage renal disease: Secondary | ICD-10-CM | POA: Diagnosis not present

## 2017-12-19 DIAGNOSIS — D631 Anemia in chronic kidney disease: Secondary | ICD-10-CM | POA: Diagnosis not present

## 2017-12-19 DIAGNOSIS — D509 Iron deficiency anemia, unspecified: Secondary | ICD-10-CM | POA: Diagnosis not present

## 2017-12-21 DIAGNOSIS — N186 End stage renal disease: Secondary | ICD-10-CM | POA: Diagnosis not present

## 2017-12-21 DIAGNOSIS — N2581 Secondary hyperparathyroidism of renal origin: Secondary | ICD-10-CM | POA: Diagnosis not present

## 2017-12-21 DIAGNOSIS — D631 Anemia in chronic kidney disease: Secondary | ICD-10-CM | POA: Diagnosis not present

## 2017-12-21 DIAGNOSIS — D509 Iron deficiency anemia, unspecified: Secondary | ICD-10-CM | POA: Diagnosis not present

## 2017-12-24 DIAGNOSIS — N186 End stage renal disease: Secondary | ICD-10-CM | POA: Diagnosis not present

## 2017-12-24 DIAGNOSIS — D509 Iron deficiency anemia, unspecified: Secondary | ICD-10-CM | POA: Diagnosis not present

## 2017-12-24 DIAGNOSIS — N2581 Secondary hyperparathyroidism of renal origin: Secondary | ICD-10-CM | POA: Diagnosis not present

## 2017-12-24 DIAGNOSIS — D631 Anemia in chronic kidney disease: Secondary | ICD-10-CM | POA: Diagnosis not present

## 2017-12-26 DIAGNOSIS — D631 Anemia in chronic kidney disease: Secondary | ICD-10-CM | POA: Diagnosis not present

## 2017-12-26 DIAGNOSIS — D509 Iron deficiency anemia, unspecified: Secondary | ICD-10-CM | POA: Diagnosis not present

## 2017-12-26 DIAGNOSIS — N186 End stage renal disease: Secondary | ICD-10-CM | POA: Diagnosis not present

## 2017-12-26 DIAGNOSIS — N2581 Secondary hyperparathyroidism of renal origin: Secondary | ICD-10-CM | POA: Diagnosis not present

## 2017-12-28 DIAGNOSIS — D631 Anemia in chronic kidney disease: Secondary | ICD-10-CM | POA: Diagnosis not present

## 2017-12-28 DIAGNOSIS — N186 End stage renal disease: Secondary | ICD-10-CM | POA: Diagnosis not present

## 2017-12-28 DIAGNOSIS — N2581 Secondary hyperparathyroidism of renal origin: Secondary | ICD-10-CM | POA: Diagnosis not present

## 2017-12-28 DIAGNOSIS — D509 Iron deficiency anemia, unspecified: Secondary | ICD-10-CM | POA: Diagnosis not present

## 2017-12-31 DIAGNOSIS — H401121 Primary open-angle glaucoma, left eye, mild stage: Secondary | ICD-10-CM | POA: Diagnosis not present

## 2017-12-31 DIAGNOSIS — Z09 Encounter for follow-up examination after completed treatment for conditions other than malignant neoplasm: Secondary | ICD-10-CM | POA: Diagnosis not present

## 2017-12-31 DIAGNOSIS — H409 Unspecified glaucoma: Secondary | ICD-10-CM | POA: Diagnosis not present

## 2017-12-31 DIAGNOSIS — H2512 Age-related nuclear cataract, left eye: Secondary | ICD-10-CM | POA: Diagnosis not present

## 2017-12-31 DIAGNOSIS — H25812 Combined forms of age-related cataract, left eye: Secondary | ICD-10-CM | POA: Diagnosis not present

## 2018-01-01 DIAGNOSIS — D509 Iron deficiency anemia, unspecified: Secondary | ICD-10-CM | POA: Diagnosis not present

## 2018-01-01 DIAGNOSIS — D631 Anemia in chronic kidney disease: Secondary | ICD-10-CM | POA: Diagnosis not present

## 2018-01-01 DIAGNOSIS — N186 End stage renal disease: Secondary | ICD-10-CM | POA: Diagnosis not present

## 2018-01-01 DIAGNOSIS — N2581 Secondary hyperparathyroidism of renal origin: Secondary | ICD-10-CM | POA: Diagnosis not present

## 2018-01-02 DIAGNOSIS — D631 Anemia in chronic kidney disease: Secondary | ICD-10-CM | POA: Diagnosis not present

## 2018-01-02 DIAGNOSIS — N2581 Secondary hyperparathyroidism of renal origin: Secondary | ICD-10-CM | POA: Diagnosis not present

## 2018-01-02 DIAGNOSIS — N186 End stage renal disease: Secondary | ICD-10-CM | POA: Diagnosis not present

## 2018-01-02 DIAGNOSIS — D509 Iron deficiency anemia, unspecified: Secondary | ICD-10-CM | POA: Diagnosis not present

## 2018-01-04 DIAGNOSIS — D631 Anemia in chronic kidney disease: Secondary | ICD-10-CM | POA: Diagnosis not present

## 2018-01-04 DIAGNOSIS — N186 End stage renal disease: Secondary | ICD-10-CM | POA: Diagnosis not present

## 2018-01-04 DIAGNOSIS — D509 Iron deficiency anemia, unspecified: Secondary | ICD-10-CM | POA: Diagnosis not present

## 2018-01-04 DIAGNOSIS — N2581 Secondary hyperparathyroidism of renal origin: Secondary | ICD-10-CM | POA: Diagnosis not present

## 2018-01-04 DIAGNOSIS — I158 Other secondary hypertension: Secondary | ICD-10-CM | POA: Diagnosis not present

## 2018-01-04 DIAGNOSIS — Z992 Dependence on renal dialysis: Secondary | ICD-10-CM | POA: Diagnosis not present

## 2018-01-07 DIAGNOSIS — D509 Iron deficiency anemia, unspecified: Secondary | ICD-10-CM | POA: Diagnosis not present

## 2018-01-07 DIAGNOSIS — D631 Anemia in chronic kidney disease: Secondary | ICD-10-CM | POA: Diagnosis not present

## 2018-01-07 DIAGNOSIS — N186 End stage renal disease: Secondary | ICD-10-CM | POA: Diagnosis not present

## 2018-01-07 DIAGNOSIS — N2581 Secondary hyperparathyroidism of renal origin: Secondary | ICD-10-CM | POA: Diagnosis not present

## 2018-01-09 DIAGNOSIS — D631 Anemia in chronic kidney disease: Secondary | ICD-10-CM | POA: Diagnosis not present

## 2018-01-09 DIAGNOSIS — D509 Iron deficiency anemia, unspecified: Secondary | ICD-10-CM | POA: Diagnosis not present

## 2018-01-09 DIAGNOSIS — N2581 Secondary hyperparathyroidism of renal origin: Secondary | ICD-10-CM | POA: Diagnosis not present

## 2018-01-09 DIAGNOSIS — N186 End stage renal disease: Secondary | ICD-10-CM | POA: Diagnosis not present

## 2018-01-10 ENCOUNTER — Ambulatory Visit (INDEPENDENT_AMBULATORY_CARE_PROVIDER_SITE_OTHER): Payer: Medicare Other | Admitting: Internal Medicine

## 2018-01-10 ENCOUNTER — Other Ambulatory Visit: Payer: Medicare Other

## 2018-01-10 ENCOUNTER — Encounter: Payer: Self-pay | Admitting: Internal Medicine

## 2018-01-10 ENCOUNTER — Ambulatory Visit (INDEPENDENT_AMBULATORY_CARE_PROVIDER_SITE_OTHER)
Admission: RE | Admit: 2018-01-10 | Discharge: 2018-01-10 | Disposition: A | Discharge status: Home / Self Care | Payer: Medicare Other | Source: Ambulatory Visit | Attending: Internal Medicine | Admitting: Internal Medicine

## 2018-01-10 VITALS — BP 118/64 | Temp 97.9°F | Ht 69.0 in | Wt 150.0 lb

## 2018-01-10 DIAGNOSIS — M545 Low back pain, unspecified: Secondary | ICD-10-CM

## 2018-01-10 DIAGNOSIS — R109 Unspecified abdominal pain: Secondary | ICD-10-CM

## 2018-01-10 DIAGNOSIS — F329 Major depressive disorder, single episode, unspecified: Secondary | ICD-10-CM

## 2018-01-10 DIAGNOSIS — I10 Essential (primary) hypertension: Secondary | ICD-10-CM

## 2018-01-10 DIAGNOSIS — F32A Depression, unspecified: Secondary | ICD-10-CM

## 2018-01-10 DIAGNOSIS — N186 End stage renal disease: Secondary | ICD-10-CM

## 2018-01-10 NOTE — Progress Notes (Signed)
Subjective:    Patient ID: [name], male    DOB: September 05, 1929, 84 y.o.   MRN: [id number]  HPI  Here with c/o 3 days acute onset left lower back pain and flank pain, sharp, moderate, constant, worse to bend and twist and palpation and Pt denies bowel or bladder change, fever, wt loss,  worsening LE pain/numbness/weakness, gait change or falls.  Denies urinary symptoms such as dysuria, frequency, urgency, flank pain, hematuria or n/v, fever, chills.  Cont's HD on M-W-F, sometimes with lower BP per wife.   Pt denies fever, wt loss, night sweats, loss of appetite, or other constitutional symptoms  Denies worsening depressive symptoms, suicidal ideation, or panic   No other interval hx or new complaint Past Medical History:  Diagnosis Date  . ANEMIA-IRON DEFICIENCY 03/09/2008  . Arthritis    cervical spine.   . Complex renal cyst 06/19/2011  . Depression 03/26/2014  . DIVERTICULOSIS, COLON 03/09/2008  . ESRD (end stage renal disease) ([place]) 03/09/2008  . Flu 12/08/2016  . GOUT 03/09/2008  . Hemorrhoids 08/2009   internal.   . History of blood transfusion   . HYPERLIPIDEMIA 03/09/2008  . Hyperparathyroidism ([place])   . HYPERTENSION 03/09/2008  . Prostate cancer [facility]) 2002   Completed external beam radiation 2003.per HPI  . PROSTATE CANCER, HX OF 03/09/2008  . Radiation cystitis 2010.  Marland Kitchen Stroke [facility])    Past Surgical History:  Procedure Laterality Date  . AV FISTULA PLACEMENT Left 01/24/2013   Procedure: INSERTION OF ARTERIOVENOUS (AV) GORE-TEX GRAFT ARM;  Surgeon: Rosetta Posner, MD;  Location: [place];  Service: Vascular;  Laterality: Left;  . COLONOSCOPY N/A 06/17/2015   Procedure: COLONOSCOPY;  Surgeon: Gatha Mayer, MD;  Location: [place];  Service: Endoscopy;  Laterality: N/A;  . ESOPHAGOGASTRODUODENOSCOPY N/A 06/17/2015   Procedure: ESOPHAGOGASTRODUODENOSCOPY (EGD);  Surgeon: Gatha Mayer, MD;  Location: [facility] ENDOSCOPY;  Service: Endoscopy;  Laterality: N/A;  . FLEXIBLE SIGMOIDOSCOPY N/A  06/15/2015   Procedure: FLEXIBLE SIGMOIDOSCOPY;  Surgeon: Gatha Mayer, MD;  Location: [place];  Service: Endoscopy;  Laterality: N/A;  . GIVENS CAPSULE STUDY N/A 07/06/2015   Procedure: GIVENS CAPSULE STUDY;  Surgeon: Milus Banister, MD;  Location: [place];  Service: Endoscopy;  Laterality: N/A;  . JOINT REPLACEMENT     HIP  . rotator cuff repair right  4/08  . s/p left hip replacement  2007   Dr. Percell Miller ortho  . TONSILLECTOMY      reports that he quit smoking about 35 years ago. His smoking use included cigarettes. He quit after 5.00 years of use. he has never used smokeless tobacco. He reports that he does not drink alcohol or use drugs. family history includes Cancer in his brother and father; Diabetes in his father; Hypertension in his brother and father. No Known Allergies Current Outpatient Medications on File Prior to Visit  Medication Sig Dispense Refill  . allopurinol (ZYLOPRIM) 100 MG tablet Take 1 tablet (100 mg total) by mouth daily. 90 tablet 3  . aspirin EC 81 MG tablet Take 1 tablet (81 mg total) by mouth daily.    . citalopram (CELEXA) 10 MG tablet Take 1 tablet (10 mg total) by mouth daily. 90 tablet 3  . docusate sodium (COLACE) 100 MG capsule Take 2 capsules (200 mg total) by mouth 2 (two) times daily. 60 capsule 0  . midodrine (PROAMATINE) 5 MG tablet Take 1 tablet (5 mg total) by mouth 3 (three) times daily. Take in  the morning, lunch time and 4pm. Do not take after 4pm. 90 tablet 3  . midodrine (PROAMATINE) 5 MG tablet TAKE 1 TABLET 3 TIMES A DAY (MORNING,LUNCH TIME,AND 4 PM).DON'T TAKE AFTER 4 PM 90 tablet 3  . Multiple Vitamin (MULTIVITAMIN WITH MINERALS) TABS tablet Take 1 tablet by mouth daily.    Marland Kitchen nystatin (MYCOSTATIN/NYSTOP) powder Use as directed twice per day (Patient taking differently: Apply 1 g topically 2 (two) times daily as needed (rash). ) 45 g 1  . rosuvastatin (CRESTOR) 20 MG tablet Take 1 tablet (20 mg total) by mouth at bedtime. 90 tablet 3    No current facility-administered medications on file prior to visit.    Review of Systems  Constitutional: Negative for other unusual diaphoresis or sweats HENT: Negative for ear discharge or swelling Eyes: Negative for other worsening visual disturbances Respiratory: Negative for stridor or other swelling  Gastrointestinal: Negative for worsening distension or other blood Genitourinary: Negative for retention or other urinary change Musculoskeletal: Negative for other MSK pain or swelling Skin: Negative for color change or other new lesions Neurological: Negative for worsening tremors and other numbness  Psychiatric/Behavioral: Negative for worsening agitation or other fatigue All other system neg per pt    Objective:   Physical Exam BP 118/64   Temp 97.9 F (36.6 C) (Oral)   Ht 5\' 9"  (1.753 m)   Wt 150 lb (68 kg)   BMI 22.15 kg/m  VS noted,  Constitutional: Pt appears in NAD HENT: Head: NCAT.  Right Ear: External ear normal.  Left Ear: External ear normal.  Eyes: . Pupils are equal, round, and reactive to light. Conjunctivae and EOM are normal Nose: without d/c or deformity Neck: Neck supple. Gross normal ROM Cardiovascular: Normal rate and regular rhythm.   Pulmonary/Chest: Effort normal and breath sounds without rales or wheezing.  Abd:  Soft, NT, ND, + BS, no organomegaly Spine nontender in midline; + tender left low lumbar paravertebral and flank tender Neurological: Pt is alert. At baseline orientation, motor grossly intact Skin: Skin is warm. No rashes, other new lesions, no LE edema Psychiatric: Pt behavior is normal without agitation , dysphoric demeanor No other exam findings    Assessment & Plan:

## 2018-01-10 NOTE — Patient Instructions (Signed)
Please continue all other medications as before, and refills have been done if requested.  Please have the pharmacy call with any other refills you may need.  Please keep your appointments with your specialists as you may have planned  Please go to the XRAY Department in the Basement (go straight as you get off the elevator) for the x-ray testing  Please go to the LAB in the Basement (turn left off the elevator) for the tests to be done today  You will be contacted regarding the referral for: Kidney ultrasound  You will be contacted by phone if any changes need to be made immediately.  Otherwise, you will receive a letter about your results with an explanation, but please check with MyChart first.  Please remember to sign up for MyChart if you have not done so, as this will be important to you in the future with finding out test results, communicating by private email, and scheduling acute appointments online when needed.

## 2018-01-11 ENCOUNTER — Encounter: Payer: Self-pay | Admitting: Internal Medicine

## 2018-01-11 ENCOUNTER — Other Ambulatory Visit: Payer: Medicare Other

## 2018-01-11 DIAGNOSIS — N2581 Secondary hyperparathyroidism of renal origin: Secondary | ICD-10-CM | POA: Diagnosis not present

## 2018-01-11 DIAGNOSIS — D631 Anemia in chronic kidney disease: Secondary | ICD-10-CM | POA: Diagnosis not present

## 2018-01-11 DIAGNOSIS — N186 End stage renal disease: Secondary | ICD-10-CM | POA: Diagnosis not present

## 2018-01-11 DIAGNOSIS — D509 Iron deficiency anemia, unspecified: Secondary | ICD-10-CM | POA: Diagnosis not present

## 2018-01-13 NOTE — Assessment & Plan Note (Addendum)
Exam c/w likely msk/muscular spasm, pt and wife decline specific pain treatment, but for UA (if able to make urine today), and wife adamant for imaging - for renal u/s and LS spine films

## 2018-01-13 NOTE — Assessment & Plan Note (Signed)
stable overall by history and exam, recent data reviewed with pt, and pt to continue medical treatment as before,  to f/u any worsening symptoms or concerns  

## 2018-01-13 NOTE — Assessment & Plan Note (Signed)
stable overall by history and exam, recent data reviewed with pt, and pt to continue medical treatment as before,  to f/u any worsening symptoms or concerns BP Readings from Last 3 Encounters:  01/10/18 118/64  10/08/17 118/68  08/17/17 (!) 145/60

## 2018-01-13 NOTE — Assessment & Plan Note (Signed)
For u/s as above

## 2018-01-14 DIAGNOSIS — N2581 Secondary hyperparathyroidism of renal origin: Secondary | ICD-10-CM | POA: Diagnosis not present

## 2018-01-14 DIAGNOSIS — D631 Anemia in chronic kidney disease: Secondary | ICD-10-CM | POA: Diagnosis not present

## 2018-01-14 DIAGNOSIS — D509 Iron deficiency anemia, unspecified: Secondary | ICD-10-CM | POA: Diagnosis not present

## 2018-01-14 DIAGNOSIS — N186 End stage renal disease: Secondary | ICD-10-CM | POA: Diagnosis not present

## 2018-01-16 DIAGNOSIS — D509 Iron deficiency anemia, unspecified: Secondary | ICD-10-CM | POA: Diagnosis not present

## 2018-01-16 DIAGNOSIS — D631 Anemia in chronic kidney disease: Secondary | ICD-10-CM | POA: Diagnosis not present

## 2018-01-16 DIAGNOSIS — N186 End stage renal disease: Secondary | ICD-10-CM | POA: Diagnosis not present

## 2018-01-16 DIAGNOSIS — N2581 Secondary hyperparathyroidism of renal origin: Secondary | ICD-10-CM | POA: Diagnosis not present

## 2018-01-18 DIAGNOSIS — D509 Iron deficiency anemia, unspecified: Secondary | ICD-10-CM | POA: Diagnosis not present

## 2018-01-18 DIAGNOSIS — D631 Anemia in chronic kidney disease: Secondary | ICD-10-CM | POA: Diagnosis not present

## 2018-01-18 DIAGNOSIS — N2581 Secondary hyperparathyroidism of renal origin: Secondary | ICD-10-CM | POA: Diagnosis not present

## 2018-01-18 DIAGNOSIS — N186 End stage renal disease: Secondary | ICD-10-CM | POA: Diagnosis not present

## 2018-01-21 DIAGNOSIS — N186 End stage renal disease: Secondary | ICD-10-CM | POA: Diagnosis not present

## 2018-01-21 DIAGNOSIS — D509 Iron deficiency anemia, unspecified: Secondary | ICD-10-CM | POA: Diagnosis not present

## 2018-01-21 DIAGNOSIS — D631 Anemia in chronic kidney disease: Secondary | ICD-10-CM | POA: Diagnosis not present

## 2018-01-21 DIAGNOSIS — N2581 Secondary hyperparathyroidism of renal origin: Secondary | ICD-10-CM | POA: Diagnosis not present

## 2018-01-23 DIAGNOSIS — N186 End stage renal disease: Secondary | ICD-10-CM | POA: Diagnosis not present

## 2018-01-23 DIAGNOSIS — D631 Anemia in chronic kidney disease: Secondary | ICD-10-CM | POA: Diagnosis not present

## 2018-01-23 DIAGNOSIS — N2581 Secondary hyperparathyroidism of renal origin: Secondary | ICD-10-CM | POA: Diagnosis not present

## 2018-01-23 DIAGNOSIS — D509 Iron deficiency anemia, unspecified: Secondary | ICD-10-CM | POA: Diagnosis not present

## 2018-01-25 DIAGNOSIS — D509 Iron deficiency anemia, unspecified: Secondary | ICD-10-CM | POA: Diagnosis not present

## 2018-01-25 DIAGNOSIS — N186 End stage renal disease: Secondary | ICD-10-CM | POA: Diagnosis not present

## 2018-01-25 DIAGNOSIS — N2581 Secondary hyperparathyroidism of renal origin: Secondary | ICD-10-CM | POA: Diagnosis not present

## 2018-01-25 DIAGNOSIS — D631 Anemia in chronic kidney disease: Secondary | ICD-10-CM | POA: Diagnosis not present

## 2018-01-28 DIAGNOSIS — N186 End stage renal disease: Secondary | ICD-10-CM | POA: Diagnosis not present

## 2018-01-28 DIAGNOSIS — D631 Anemia in chronic kidney disease: Secondary | ICD-10-CM | POA: Diagnosis not present

## 2018-01-28 DIAGNOSIS — N2581 Secondary hyperparathyroidism of renal origin: Secondary | ICD-10-CM | POA: Diagnosis not present

## 2018-01-28 DIAGNOSIS — D509 Iron deficiency anemia, unspecified: Secondary | ICD-10-CM | POA: Diagnosis not present

## 2018-01-30 DIAGNOSIS — D631 Anemia in chronic kidney disease: Secondary | ICD-10-CM | POA: Diagnosis not present

## 2018-01-30 DIAGNOSIS — N186 End stage renal disease: Secondary | ICD-10-CM | POA: Diagnosis not present

## 2018-01-30 DIAGNOSIS — N2581 Secondary hyperparathyroidism of renal origin: Secondary | ICD-10-CM | POA: Diagnosis not present

## 2018-01-30 DIAGNOSIS — D509 Iron deficiency anemia, unspecified: Secondary | ICD-10-CM | POA: Diagnosis not present

## 2018-01-31 ENCOUNTER — Encounter: Payer: Self-pay | Admitting: Internal Medicine

## 2018-01-31 ENCOUNTER — Ambulatory Visit
Admission: RE | Admit: 2018-01-31 | Discharge: 2018-01-31 | Disposition: A | Discharge status: Home / Self Care | Payer: Medicare Other | Source: Ambulatory Visit | Attending: Internal Medicine | Admitting: Internal Medicine

## 2018-01-31 DIAGNOSIS — N186 End stage renal disease: Secondary | ICD-10-CM

## 2018-01-31 DIAGNOSIS — R109 Unspecified abdominal pain: Secondary | ICD-10-CM

## 2018-01-31 DIAGNOSIS — M545 Low back pain, unspecified: Secondary | ICD-10-CM

## 2018-02-01 DIAGNOSIS — N186 End stage renal disease: Secondary | ICD-10-CM | POA: Diagnosis not present

## 2018-02-01 DIAGNOSIS — N2581 Secondary hyperparathyroidism of renal origin: Secondary | ICD-10-CM | POA: Diagnosis not present

## 2018-02-01 DIAGNOSIS — D509 Iron deficiency anemia, unspecified: Secondary | ICD-10-CM | POA: Diagnosis not present

## 2018-02-01 DIAGNOSIS — D631 Anemia in chronic kidney disease: Secondary | ICD-10-CM | POA: Diagnosis not present

## 2018-02-04 DIAGNOSIS — Z992 Dependence on renal dialysis: Secondary | ICD-10-CM | POA: Diagnosis not present

## 2018-02-04 DIAGNOSIS — N2581 Secondary hyperparathyroidism of renal origin: Secondary | ICD-10-CM | POA: Diagnosis not present

## 2018-02-04 DIAGNOSIS — N186 End stage renal disease: Secondary | ICD-10-CM | POA: Diagnosis not present

## 2018-02-04 DIAGNOSIS — D509 Iron deficiency anemia, unspecified: Secondary | ICD-10-CM | POA: Diagnosis not present

## 2018-02-04 DIAGNOSIS — I158 Other secondary hypertension: Secondary | ICD-10-CM | POA: Diagnosis not present

## 2018-02-06 ENCOUNTER — Telehealth: Payer: Self-pay | Admitting: Internal Medicine

## 2018-02-06 DIAGNOSIS — D509 Iron deficiency anemia, unspecified: Secondary | ICD-10-CM | POA: Diagnosis not present

## 2018-02-06 DIAGNOSIS — N186 End stage renal disease: Secondary | ICD-10-CM | POA: Diagnosis not present

## 2018-02-06 DIAGNOSIS — N2581 Secondary hyperparathyroidism of renal origin: Secondary | ICD-10-CM | POA: Diagnosis not present

## 2018-02-06 NOTE — Telephone Encounter (Signed)
Pt's wife given [place] results.

## 2018-02-06 NOTE — Telephone Encounter (Signed)
Copied from [place] [id number]. Topic: Quick Communication - See Telephone Encounter >> Feb 06, 2018 12:45 PM Percell Belt A wrote: CRM for notification. See Telephone encounter for: 02/06/18. Pt wife called in and is requesting some one to call them about this pt xray results.    Best number [phone]

## 2018-02-08 DIAGNOSIS — N186 End stage renal disease: Secondary | ICD-10-CM | POA: Diagnosis not present

## 2018-02-08 DIAGNOSIS — N2581 Secondary hyperparathyroidism of renal origin: Secondary | ICD-10-CM | POA: Diagnosis not present

## 2018-02-08 DIAGNOSIS — D509 Iron deficiency anemia, unspecified: Secondary | ICD-10-CM | POA: Diagnosis not present

## 2018-02-11 DIAGNOSIS — N186 End stage renal disease: Secondary | ICD-10-CM | POA: Diagnosis not present

## 2018-02-11 DIAGNOSIS — N2581 Secondary hyperparathyroidism of renal origin: Secondary | ICD-10-CM | POA: Diagnosis not present

## 2018-02-11 DIAGNOSIS — D509 Iron deficiency anemia, unspecified: Secondary | ICD-10-CM | POA: Diagnosis not present

## 2018-02-12 DIAGNOSIS — L219 Seborrheic dermatitis, unspecified: Secondary | ICD-10-CM | POA: Diagnosis not present

## 2018-02-13 DIAGNOSIS — N2581 Secondary hyperparathyroidism of renal origin: Secondary | ICD-10-CM | POA: Diagnosis not present

## 2018-02-13 DIAGNOSIS — N186 End stage renal disease: Secondary | ICD-10-CM | POA: Diagnosis not present

## 2018-02-13 DIAGNOSIS — D509 Iron deficiency anemia, unspecified: Secondary | ICD-10-CM | POA: Diagnosis not present

## 2018-02-15 DIAGNOSIS — N186 End stage renal disease: Secondary | ICD-10-CM | POA: Diagnosis not present

## 2018-02-15 DIAGNOSIS — N2581 Secondary hyperparathyroidism of renal origin: Secondary | ICD-10-CM | POA: Diagnosis not present

## 2018-02-15 DIAGNOSIS — D509 Iron deficiency anemia, unspecified: Secondary | ICD-10-CM | POA: Diagnosis not present

## 2018-02-18 DIAGNOSIS — N2581 Secondary hyperparathyroidism of renal origin: Secondary | ICD-10-CM | POA: Diagnosis not present

## 2018-02-18 DIAGNOSIS — N186 End stage renal disease: Secondary | ICD-10-CM | POA: Diagnosis not present

## 2018-02-18 DIAGNOSIS — D509 Iron deficiency anemia, unspecified: Secondary | ICD-10-CM | POA: Diagnosis not present

## 2018-02-19 ENCOUNTER — Encounter: Payer: Self-pay | Admitting: Neurology

## 2018-02-19 ENCOUNTER — Ambulatory Visit (INDEPENDENT_AMBULATORY_CARE_PROVIDER_SITE_OTHER): Payer: Medicare Other | Admitting: Neurology

## 2018-02-19 VITALS — BP 124/58 | HR 85 | Ht 69.0 in | Wt 152.0 lb

## 2018-02-19 DIAGNOSIS — Z8719 Personal history of other diseases of the digestive system: Secondary | ICD-10-CM | POA: Diagnosis not present

## 2018-02-19 DIAGNOSIS — N186 End stage renal disease: Secondary | ICD-10-CM

## 2018-02-19 DIAGNOSIS — Z992 Dependence on renal dialysis: Secondary | ICD-10-CM | POA: Diagnosis not present

## 2018-02-19 DIAGNOSIS — I63311 Cerebral infarction due to thrombosis of right middle cerebral artery: Secondary | ICD-10-CM | POA: Diagnosis not present

## 2018-02-19 DIAGNOSIS — I951 Orthostatic hypotension: Secondary | ICD-10-CM | POA: Diagnosis not present

## 2018-02-19 MED ORDER — MIDODRINE HCL 10 MG PO TABS: 10.0000 mg | ORAL_TABLET | Freq: Three times a day (TID) | ORAL | 5 refills | Status: DC

## 2018-02-19 NOTE — Patient Instructions (Addendum)
-   continue ASA EC 81mg  and crestor for stroke prevention - continue follow up with kidney doctor and get iron infusion and anemia monitoring and to discuss about about decrease fluid take out during dialysis - recommend to increase fluid intake during the day and the night before dialysis - will increase midodrine 10mg  three times a day to help the BP, take in the morning (or before dialysis), lunch time and 4pm. Do not take after 4pm.  - check BP at home and BP goal 110-130 - Follow up with your primary care physician for stroke risk factor modification. Recommend maintain blood pressure goal 110-130/80, diabetes with hemoglobin A1c goal below 6.5% and lipids with LDL cholesterol goal below 70 mg/dL.  - follow up in 4 months with [name].

## 2018-02-19 NOTE — Progress Notes (Signed)
STROKE NEUROLOGY FOLLOW UP NOTE  NAME: [name] DOB: October 29, 1929  REASON FOR VISIT: stroke follow up HISTORY FROM: wife and chart  Today we had the pleasure of seeing [name] in follow-up at our Neurology Clinic. Pt was accompanied by wife.   History Summary [name] is a 84 y.o. male with history of ESRD on hemodialysis, colonic diverticulosis with LGIB requiring blood transfusion, hypertension and hyperlipidemia was admitted on 07/05/15 for left facial droop, weakness and fatigue after a brief episode of loss of consciousness. MRI showed left centrum semiovale subcortical infarct, likely due to small vessel disease. Stroke work up including TCD, TTE, CUS, venous doppler UE and LE, A1C all negative. LDL 98. Pt had several episodes of GIB with anemia requiring blood transfusion so that ASA was on hold initially. EGD/colonoscopy on 8/12 was negative, and Capsule Endoscopy negative as well. GI suspects a stuttering diverticular bleed. ASA was resumed after GI agreement. Pt was discharged with ASA and crestor.  09/09/15 follow up - the patient has been doing well. No more GIB. Continues on ASA without issue. His BP at home 112-114 but today 98/50. Pt asymptomatic. Denies any dizziness or feeling weak. atenolol discontinued so far. Continues to have HD for ESRD. Asking for driving.    03/07/16 follow up - he was doing well. He went to ER on 02/28/16 for episode of passing out. He had HD that day in the morning. In the evening, he was having shower, he bent down to get shower material and when he got up, he passed out and fall. Wife got him out of shower and put him in supine position, he gradually recovered. EMS checked his orthostatic vital showed difference of BPs and sent him to ER. He was conscious and no confusion or post ictal with EMS. In ER BP recovered and he was sent back home. As per wife, he is taking atenolol 25mg  on dialysis days and 50mg  other days. BP usually 100-110 at  home. Today BP 112/55.  09/07/16 follow up - pt has been doing well. Followed with cardiology for syncope management. Took off all BP meds and just had 30 day cardiac event monitoring, report pending. However, his BP still low. Today not his dialysis day BP 98/49. Wife said sometimes with dialysis BP down to 70s. Sometimes he stand up from chair having pre-syncope like condition, staring off and imbalance. No other complains.  12/12/16 follow up - pt was admitted on 10/04/16 for anemia, found to have positive FOB, received 2 PRBC transfusion, ASA on hold. Followed up with PCP on 10/12/16 and considered anemia most likely due to ESRD as he had extensive GI work up in the past, only showed diverticulosis. However, ASA still on hold. 12/09/15 pt had fever, confusion and admitted to [facility] for flu, discharged with tamiflu several days ago. Currently, doing well. On midodrine and BP 124/59.   03/20/2017 follow up (CM) - Mr. [name], 84 year old male returns for follow-up with history of stroke August 2016. Admission for anemia in November of last year and aspirin was held. This was restarted February by Dr. Erlinda Hong. He has minimal bruising and no bleeding. He has not had further stroke or TIA symptoms. According to the wife he gets his hemoglobin checked weekly at dialysis clinic. He goes Monday Wednesdays and Fridays. Blood pressure in the office today 106/64. Patient did take his midodrine  this morning. Wife checks his blood pressure 3 times a day. His anemia is  probably due to end-stage renal disease extensive GI workup in the past. He does little exercise but used to bowl. He is on Crestor for hyperlipidemia without myalgias.   Interval History During the interval time, pt has been doing well from stroke standpoint. His Hb stable on checking every week, still on ASA and crestor without difficulty. However, pt and family complains that his BP drops to 80s frequently during dialysis and he has to put on supine position  frequently for hypotension. After dialysis, his BP still low at home at 100s. He became lethargic and dizziness on get up at home frequently. He misses his midodrine frequently too due to dialysis schedule and lethargy needing sleep. BP, however, today 124/58 in clinic. Wife also complains of short memory loss but convinced that it is related to his low BP.   REVIEW OF SYSTEMS: Full 14 system review of systems performed and notable only for those listed below and in HPI above, all others are negative:  Constitutional:   Cardiovascular:  Ear/Nose/Throat:  Hearing loss Skin:  Eyes:   Respiratory:   Gastroitestinal:   Genitourinary:  Hematology/Lymphatic:  anemia Endocrine: cold intolerance Musculoskeletal:  Back pain Allergy/Immunology:   Neurological: memory loss  Psychiatric:  Sleep:   The following represents the patient's updated allergies and side effects list: No Known Allergies  The neurologically relevant items on the patient's problem list were reviewed on today's visit.  Neurologic Examination  A problem focused neurological exam (12 or more points of the single system neurologic examination, vital signs counts as 1 point, cranial nerves count for 8 points) was performed.  Blood pressure (!) 124/58, pulse 85, height 5\' 9"  (1.753 m), weight 152 lb (68.9 kg).  General - Well nourished, well developed, in no apparent distress.  Ophthalmologic - Fundi not visualized due to eye movement.  Cardiovascular - Regular rate and rhythm.  Mental Status -  Level of arousal and orientation to time, place, and person were intact. Language including expression, naming, repetition, comprehension was assessed and found intact. Fund of Knowledge was assessed and was intact impaired.  Cranial Nerves II - XII - II - Visual field intact OU. III, IV, VI - Extraocular movements intact. V - Facial sensation intact bilaterally. VII - facial symmetrical VIII - Hearing & vestibular intact  bilaterally. X - Palate elevates symmetrically. XI - Chin turning & shoulder shrug intact bilaterally. XII - Tongue protrusion intact.  Motor Strength - The patient's strength was normal in all extremities and pronator drift was absent.  Bulk was normal and fasciculations were absent.   Motor Tone - Muscle tone was assessed at the neck and appendages and was normal.  Reflexes - The patient's reflexes were 1+ in all extremities and he had no pathological reflexes.  Sensory - Light touch, temperature/pinprick were assessed and were normal.    Coordination - The patient had normal movements in the hands and feet with no ataxia or dysmetria.  Tremor was absent.  Gait and Station - walk without cane, mildly stooped posturing, slow and small stride.  Data reviewed: I personally reviewed the images and agree with the radiology interpretations.  Mr Brain Wo Contrast 07/05/2015 1 x 1 x 2 cm area of nonhemorrhagic acute infarction affecting the subcortical and periventricular deep white matter of the RIGHT centrum semiovale. Chronic changes as described.   CUS - Bilateral: 1-39% ICA stenosis. Vertebral artery flow is antegrade.  UE venous doppler - Bilateral: No evidence of DVT or superficial thrombosis. Left: Hemodialysis  graft is patent, however, there is not complete color fill. No echoes within the graft to suggest thrombus.   LE venous doppler - negative for DVT bilaterally  2D echo - Left ventricle: The cavity size was normal. Systolic function washyperdynamic. The estimated ejection fraction was in the range of70% to 75%. There was dynamic obstruction in the mid cavity, witha peak velocity of 200 cm/sec and a peak gradient of 16 mm Hg.Wall motion was normal; there were no regional wall motionabnormalities. There was an increased relative contribution ofatrial contraction to ventricular filling, which may be due tohypovolemia. - Aortic valve: Valve area (VTI): 2.73 cm^2.  Valve area (Vmax):2.05 cm^2. Valve area (Vmean): 2.14 cm^2. - Right ventricle: Systolic function was hyperdynamic. - Pulmonary arteries: PA peak pressure: 33 mm Hg (S). - Systemic veins: The inferior vena cava is collapsed. Impressions: Findings are consistent with hypovolemia.  TCD - This was a normal transcranial Doppler study, with normal flow direction and velocity of all identified vessels of the anterior and posterior circulations, with no evidence of stenosis, vasospasm or occlusion. There was no evidence of intracranial disease.  30 day cardiac event monitoring - NSR with occasional PVCs   Component     Latest Ref Rng 07/05/2015  Cholesterol     0 - 200 mg/dL 188  Triglycerides     <150 mg/dL 123  HDL Cholesterol     >40 mg/dL 65  Total CHOL/HDL Ratio      2.9  VLDL     0 - 40 mg/dL 25  LDL (calc)     0 - 99 mg/dL 98  Hemoglobin A1C     4.8 - 5.6 % 4.9  Mean Plasma Glucose      94  TSH     0.350 - 4.500 uIU/mL 0.551  Free T4     0.61 - 1.12 ng/dL 1.00    Assessment: As you may recall, he is a 84 y.o. African American male with PMH of ESRD on hemodialysis, colonic diverticulosis with LGIB requiring blood transfusion, hypertension and hyperlipidemia was admitted on 07/05/15 for left centrum semiovale subcortical infarct, likely due to small vessel disease. Stroke work up including TCD, TTE, CUS, venous doppler UE and LE, A1C all negative. LDL 98. Pt had GIB with anemia requiring blood transfusion so that ASA was on hold initially. GI work up negative and ASA was resumed after GI agreement. Pt was discharged with ASA and crestor. During the interval time, no GIB. On ASA without issue. BP at low side but asymptomatic. However, he had syncope episode on 02/28/16. BP borderline low and positive orthostatic. No seizure or stroke. He is off all BP meds now, but BP still low, even lower on dialysis days, put on midodrine, BP much improved. 30 day cardiac monitoring no afib.  However, had admission 09/2016 for anemia required for PRBC. ASA on hold again. Anemia more likely due to ESRD instead of GIB. Since then Hb has been at 12s, resumed ASA EC 81mg . Still has low BP during and after dialysis which likely affected his memory and daily activity. Pt not drink much at home.   Plan:  - continue ASA EC 81mg  and crestor for stroke prevention - continue follow up with kidney doctor and get iron infusion and anemia monitoring and to discuss about about decrease fluid take out during dialysis - recommend to increase fluid intake during the day and the night before dialysis - will increase midodrine 10mg  three times a day to help  the BP, take in the morning (or before dialysis), lunch time and 4pm. Do not take after 4pm.  - check BP at home and BP goal 110-130 - Follow up with your primary care physician for stroke risk factor modification. Recommend maintain blood pressure goal 110-130/80, diabetes with hemoglobin A1c goal below 6.5% and lipids with LDL cholesterol goal below 70 mg/dL.  - follow up in 4 months with [name].   I spent more than 25 minutes of face to face time with the patient. Greater than 50% of time was spent in counseling and coordination of care. We have discussed about BP management, more fluid intake and increase midodrine dose.   No orders of the defined types were placed in this encounter.   Meds ordered this encounter  Medications  . midodrine (PROAMATINE) 10 MG tablet    Sig: Take 1 tablet (10 mg total) by mouth 3 (three) times daily. Take in the morning, lunch time and 4pm. Do not take after 4pm.    Dispense:  90 tablet    Refill:  5    Patient Instructions  - continue ASA EC 81mg  and crestor for stroke prevention - continue follow up with kidney doctor and get iron infusion and anemia monitoring and to discuss about about decrease fluid take out during dialysis - recommend to increase fluid intake during the day and the night before  dialysis - will increase midodrine 10mg  three times a day to help the BP, take in the morning (or before dialysis), lunch time and 4pm. Do not take after 4pm.  - check BP at home and BP goal 110-130 - Follow up with your primary care physician for stroke risk factor modification. Recommend maintain blood pressure goal 110-130/80, diabetes with hemoglobin A1c goal below 6.5% and lipids with LDL cholesterol goal below 70 mg/dL.  - follow up in 4 months with [name].    Rosalin Hawking, MD PhD [facility] [address], [place], [place] [address] [phone]

## 2018-02-20 DIAGNOSIS — N2581 Secondary hyperparathyroidism of renal origin: Secondary | ICD-10-CM | POA: Diagnosis not present

## 2018-02-20 DIAGNOSIS — D509 Iron deficiency anemia, unspecified: Secondary | ICD-10-CM | POA: Diagnosis not present

## 2018-02-20 DIAGNOSIS — N186 End stage renal disease: Secondary | ICD-10-CM | POA: Diagnosis not present

## 2018-02-22 DIAGNOSIS — N186 End stage renal disease: Secondary | ICD-10-CM | POA: Diagnosis not present

## 2018-02-22 DIAGNOSIS — N2581 Secondary hyperparathyroidism of renal origin: Secondary | ICD-10-CM | POA: Diagnosis not present

## 2018-02-22 DIAGNOSIS — D509 Iron deficiency anemia, unspecified: Secondary | ICD-10-CM | POA: Diagnosis not present

## 2018-02-25 DIAGNOSIS — N186 End stage renal disease: Secondary | ICD-10-CM | POA: Diagnosis not present

## 2018-02-25 DIAGNOSIS — N2581 Secondary hyperparathyroidism of renal origin: Secondary | ICD-10-CM | POA: Diagnosis not present

## 2018-02-25 DIAGNOSIS — D509 Iron deficiency anemia, unspecified: Secondary | ICD-10-CM | POA: Diagnosis not present

## 2018-02-27 DIAGNOSIS — N2581 Secondary hyperparathyroidism of renal origin: Secondary | ICD-10-CM | POA: Diagnosis not present

## 2018-02-27 DIAGNOSIS — N186 End stage renal disease: Secondary | ICD-10-CM | POA: Diagnosis not present

## 2018-02-27 DIAGNOSIS — D509 Iron deficiency anemia, unspecified: Secondary | ICD-10-CM | POA: Diagnosis not present

## 2018-03-01 DIAGNOSIS — N2581 Secondary hyperparathyroidism of renal origin: Secondary | ICD-10-CM | POA: Diagnosis not present

## 2018-03-01 DIAGNOSIS — D509 Iron deficiency anemia, unspecified: Secondary | ICD-10-CM | POA: Diagnosis not present

## 2018-03-01 DIAGNOSIS — N186 End stage renal disease: Secondary | ICD-10-CM | POA: Diagnosis not present

## 2018-03-04 DIAGNOSIS — N2581 Secondary hyperparathyroidism of renal origin: Secondary | ICD-10-CM | POA: Diagnosis not present

## 2018-03-04 DIAGNOSIS — D509 Iron deficiency anemia, unspecified: Secondary | ICD-10-CM | POA: Diagnosis not present

## 2018-03-04 DIAGNOSIS — N186 End stage renal disease: Secondary | ICD-10-CM | POA: Diagnosis not present

## 2018-03-06 DIAGNOSIS — Z992 Dependence on renal dialysis: Secondary | ICD-10-CM | POA: Diagnosis not present

## 2018-03-06 DIAGNOSIS — N186 End stage renal disease: Secondary | ICD-10-CM | POA: Diagnosis not present

## 2018-03-06 DIAGNOSIS — I158 Other secondary hypertension: Secondary | ICD-10-CM | POA: Diagnosis not present

## 2018-03-06 DIAGNOSIS — D631 Anemia in chronic kidney disease: Secondary | ICD-10-CM | POA: Diagnosis not present

## 2018-03-06 DIAGNOSIS — N2581 Secondary hyperparathyroidism of renal origin: Secondary | ICD-10-CM | POA: Diagnosis not present

## 2018-03-08 DIAGNOSIS — N186 End stage renal disease: Secondary | ICD-10-CM | POA: Diagnosis not present

## 2018-03-08 DIAGNOSIS — N2581 Secondary hyperparathyroidism of renal origin: Secondary | ICD-10-CM | POA: Diagnosis not present

## 2018-03-08 DIAGNOSIS — D631 Anemia in chronic kidney disease: Secondary | ICD-10-CM | POA: Diagnosis not present

## 2018-03-11 DIAGNOSIS — D631 Anemia in chronic kidney disease: Secondary | ICD-10-CM | POA: Diagnosis not present

## 2018-03-11 DIAGNOSIS — N2581 Secondary hyperparathyroidism of renal origin: Secondary | ICD-10-CM | POA: Diagnosis not present

## 2018-03-11 DIAGNOSIS — N186 End stage renal disease: Secondary | ICD-10-CM | POA: Diagnosis not present

## 2018-03-13 DIAGNOSIS — N2581 Secondary hyperparathyroidism of renal origin: Secondary | ICD-10-CM | POA: Diagnosis not present

## 2018-03-13 DIAGNOSIS — N186 End stage renal disease: Secondary | ICD-10-CM | POA: Diagnosis not present

## 2018-03-13 DIAGNOSIS — D631 Anemia in chronic kidney disease: Secondary | ICD-10-CM | POA: Diagnosis not present

## 2018-03-15 DIAGNOSIS — D631 Anemia in chronic kidney disease: Secondary | ICD-10-CM | POA: Diagnosis not present

## 2018-03-15 DIAGNOSIS — N186 End stage renal disease: Secondary | ICD-10-CM | POA: Diagnosis not present

## 2018-03-15 DIAGNOSIS — N2581 Secondary hyperparathyroidism of renal origin: Secondary | ICD-10-CM | POA: Diagnosis not present

## 2018-03-18 DIAGNOSIS — D631 Anemia in chronic kidney disease: Secondary | ICD-10-CM | POA: Diagnosis not present

## 2018-03-18 DIAGNOSIS — N2581 Secondary hyperparathyroidism of renal origin: Secondary | ICD-10-CM | POA: Diagnosis not present

## 2018-03-18 DIAGNOSIS — N186 End stage renal disease: Secondary | ICD-10-CM | POA: Diagnosis not present

## 2018-03-20 DIAGNOSIS — D631 Anemia in chronic kidney disease: Secondary | ICD-10-CM | POA: Diagnosis not present

## 2018-03-20 DIAGNOSIS — N186 End stage renal disease: Secondary | ICD-10-CM | POA: Diagnosis not present

## 2018-03-20 DIAGNOSIS — N2581 Secondary hyperparathyroidism of renal origin: Secondary | ICD-10-CM | POA: Diagnosis not present

## 2018-03-22 DIAGNOSIS — N2581 Secondary hyperparathyroidism of renal origin: Secondary | ICD-10-CM | POA: Diagnosis not present

## 2018-03-22 DIAGNOSIS — D631 Anemia in chronic kidney disease: Secondary | ICD-10-CM | POA: Diagnosis not present

## 2018-03-22 DIAGNOSIS — N186 End stage renal disease: Secondary | ICD-10-CM | POA: Diagnosis not present

## 2018-03-25 DIAGNOSIS — N2581 Secondary hyperparathyroidism of renal origin: Secondary | ICD-10-CM | POA: Diagnosis not present

## 2018-03-25 DIAGNOSIS — N186 End stage renal disease: Secondary | ICD-10-CM | POA: Diagnosis not present

## 2018-03-25 DIAGNOSIS — D631 Anemia in chronic kidney disease: Secondary | ICD-10-CM | POA: Diagnosis not present

## 2018-03-27 DIAGNOSIS — D631 Anemia in chronic kidney disease: Secondary | ICD-10-CM | POA: Diagnosis not present

## 2018-03-27 DIAGNOSIS — N2581 Secondary hyperparathyroidism of renal origin: Secondary | ICD-10-CM | POA: Diagnosis not present

## 2018-03-27 DIAGNOSIS — N186 End stage renal disease: Secondary | ICD-10-CM | POA: Diagnosis not present

## 2018-03-29 DIAGNOSIS — N186 End stage renal disease: Secondary | ICD-10-CM | POA: Diagnosis not present

## 2018-03-29 DIAGNOSIS — N2581 Secondary hyperparathyroidism of renal origin: Secondary | ICD-10-CM | POA: Diagnosis not present

## 2018-03-29 DIAGNOSIS — D631 Anemia in chronic kidney disease: Secondary | ICD-10-CM | POA: Diagnosis not present

## 2018-04-01 DIAGNOSIS — D631 Anemia in chronic kidney disease: Secondary | ICD-10-CM | POA: Diagnosis not present

## 2018-04-01 DIAGNOSIS — N186 End stage renal disease: Secondary | ICD-10-CM | POA: Diagnosis not present

## 2018-04-01 DIAGNOSIS — N2581 Secondary hyperparathyroidism of renal origin: Secondary | ICD-10-CM | POA: Diagnosis not present

## 2018-04-03 DIAGNOSIS — D631 Anemia in chronic kidney disease: Secondary | ICD-10-CM | POA: Diagnosis not present

## 2018-04-03 DIAGNOSIS — N186 End stage renal disease: Secondary | ICD-10-CM | POA: Diagnosis not present

## 2018-04-03 DIAGNOSIS — N2581 Secondary hyperparathyroidism of renal origin: Secondary | ICD-10-CM | POA: Diagnosis not present

## 2018-04-04 DIAGNOSIS — H401121 Primary open-angle glaucoma, left eye, mild stage: Secondary | ICD-10-CM | POA: Diagnosis not present

## 2018-04-05 DIAGNOSIS — N2581 Secondary hyperparathyroidism of renal origin: Secondary | ICD-10-CM | POA: Diagnosis not present

## 2018-04-05 DIAGNOSIS — D631 Anemia in chronic kidney disease: Secondary | ICD-10-CM | POA: Diagnosis not present

## 2018-04-05 DIAGNOSIS — N186 End stage renal disease: Secondary | ICD-10-CM | POA: Diagnosis not present

## 2018-04-06 DIAGNOSIS — N186 End stage renal disease: Secondary | ICD-10-CM | POA: Diagnosis not present

## 2018-04-06 DIAGNOSIS — I158 Other secondary hypertension: Secondary | ICD-10-CM | POA: Diagnosis not present

## 2018-04-06 DIAGNOSIS — Z992 Dependence on renal dialysis: Secondary | ICD-10-CM | POA: Diagnosis not present

## 2018-04-08 DIAGNOSIS — N186 End stage renal disease: Secondary | ICD-10-CM | POA: Diagnosis not present

## 2018-04-08 DIAGNOSIS — N2581 Secondary hyperparathyroidism of renal origin: Secondary | ICD-10-CM | POA: Diagnosis not present

## 2018-04-10 DIAGNOSIS — N2581 Secondary hyperparathyroidism of renal origin: Secondary | ICD-10-CM | POA: Diagnosis not present

## 2018-04-10 DIAGNOSIS — N186 End stage renal disease: Secondary | ICD-10-CM | POA: Diagnosis not present

## 2018-04-12 DIAGNOSIS — N186 End stage renal disease: Secondary | ICD-10-CM | POA: Diagnosis not present

## 2018-04-12 DIAGNOSIS — N2581 Secondary hyperparathyroidism of renal origin: Secondary | ICD-10-CM | POA: Diagnosis not present

## 2018-04-15 DIAGNOSIS — N2581 Secondary hyperparathyroidism of renal origin: Secondary | ICD-10-CM | POA: Diagnosis not present

## 2018-04-15 DIAGNOSIS — N186 End stage renal disease: Secondary | ICD-10-CM | POA: Diagnosis not present

## 2018-04-17 DIAGNOSIS — N2581 Secondary hyperparathyroidism of renal origin: Secondary | ICD-10-CM | POA: Diagnosis not present

## 2018-04-17 DIAGNOSIS — N186 End stage renal disease: Secondary | ICD-10-CM | POA: Diagnosis not present

## 2018-04-18 DIAGNOSIS — C61 Malignant neoplasm of prostate: Secondary | ICD-10-CM | POA: Diagnosis not present

## 2018-04-19 DIAGNOSIS — N186 End stage renal disease: Secondary | ICD-10-CM | POA: Diagnosis not present

## 2018-04-19 DIAGNOSIS — N2581 Secondary hyperparathyroidism of renal origin: Secondary | ICD-10-CM | POA: Diagnosis not present

## 2018-04-22 DIAGNOSIS — N186 End stage renal disease: Secondary | ICD-10-CM | POA: Diagnosis not present

## 2018-04-22 DIAGNOSIS — N2581 Secondary hyperparathyroidism of renal origin: Secondary | ICD-10-CM | POA: Diagnosis not present

## 2018-04-24 DIAGNOSIS — N2581 Secondary hyperparathyroidism of renal origin: Secondary | ICD-10-CM | POA: Diagnosis not present

## 2018-04-24 DIAGNOSIS — N186 End stage renal disease: Secondary | ICD-10-CM | POA: Diagnosis not present

## 2018-04-25 ENCOUNTER — Other Ambulatory Visit: Payer: Self-pay | Admitting: Urology

## 2018-04-25 DIAGNOSIS — R9721 Rising PSA following treatment for malignant neoplasm of prostate: Secondary | ICD-10-CM

## 2018-04-25 DIAGNOSIS — C61 Malignant neoplasm of prostate: Secondary | ICD-10-CM

## 2018-04-26 DIAGNOSIS — N186 End stage renal disease: Secondary | ICD-10-CM | POA: Diagnosis not present

## 2018-04-26 DIAGNOSIS — N2581 Secondary hyperparathyroidism of renal origin: Secondary | ICD-10-CM | POA: Diagnosis not present

## 2018-04-29 DIAGNOSIS — N186 End stage renal disease: Secondary | ICD-10-CM | POA: Diagnosis not present

## 2018-04-29 DIAGNOSIS — N2581 Secondary hyperparathyroidism of renal origin: Secondary | ICD-10-CM | POA: Diagnosis not present

## 2018-05-01 DIAGNOSIS — N2581 Secondary hyperparathyroidism of renal origin: Secondary | ICD-10-CM | POA: Diagnosis not present

## 2018-05-01 DIAGNOSIS — N186 End stage renal disease: Secondary | ICD-10-CM | POA: Diagnosis not present

## 2018-05-03 DIAGNOSIS — N2581 Secondary hyperparathyroidism of renal origin: Secondary | ICD-10-CM | POA: Diagnosis not present

## 2018-05-03 DIAGNOSIS — N186 End stage renal disease: Secondary | ICD-10-CM | POA: Diagnosis not present

## 2018-05-06 DIAGNOSIS — D631 Anemia in chronic kidney disease: Secondary | ICD-10-CM | POA: Diagnosis not present

## 2018-05-06 DIAGNOSIS — Z992 Dependence on renal dialysis: Secondary | ICD-10-CM | POA: Diagnosis not present

## 2018-05-06 DIAGNOSIS — N2581 Secondary hyperparathyroidism of renal origin: Secondary | ICD-10-CM | POA: Diagnosis not present

## 2018-05-06 DIAGNOSIS — I158 Other secondary hypertension: Secondary | ICD-10-CM | POA: Diagnosis not present

## 2018-05-06 DIAGNOSIS — N186 End stage renal disease: Secondary | ICD-10-CM | POA: Diagnosis not present

## 2018-05-08 DIAGNOSIS — D631 Anemia in chronic kidney disease: Secondary | ICD-10-CM | POA: Diagnosis not present

## 2018-05-08 DIAGNOSIS — N186 End stage renal disease: Secondary | ICD-10-CM | POA: Diagnosis not present

## 2018-05-08 DIAGNOSIS — N2581 Secondary hyperparathyroidism of renal origin: Secondary | ICD-10-CM | POA: Diagnosis not present

## 2018-05-10 DIAGNOSIS — N186 End stage renal disease: Secondary | ICD-10-CM | POA: Diagnosis not present

## 2018-05-10 DIAGNOSIS — D631 Anemia in chronic kidney disease: Secondary | ICD-10-CM | POA: Diagnosis not present

## 2018-05-10 DIAGNOSIS — N2581 Secondary hyperparathyroidism of renal origin: Secondary | ICD-10-CM | POA: Diagnosis not present

## 2018-05-13 DIAGNOSIS — D631 Anemia in chronic kidney disease: Secondary | ICD-10-CM | POA: Diagnosis not present

## 2018-05-13 DIAGNOSIS — N2581 Secondary hyperparathyroidism of renal origin: Secondary | ICD-10-CM | POA: Diagnosis not present

## 2018-05-13 DIAGNOSIS — N186 End stage renal disease: Secondary | ICD-10-CM | POA: Diagnosis not present

## 2018-05-14 ENCOUNTER — Encounter (HOSPITAL_COMMUNITY)
Admission: RE | Admit: 2018-05-14 | Discharge: 2018-05-14 | Disposition: A | Discharge status: Home / Self Care | Payer: Medicare Other | Source: Ambulatory Visit | Attending: Urology | Admitting: Urology

## 2018-05-14 DIAGNOSIS — R9721 Rising PSA following treatment for malignant neoplasm of prostate: Secondary | ICD-10-CM | POA: Diagnosis not present

## 2018-05-14 DIAGNOSIS — C61 Malignant neoplasm of prostate: Secondary | ICD-10-CM | POA: Diagnosis not present

## 2018-05-14 MED ORDER — TECHNETIUM TC 99M MEDRONATE IV KIT
21.9000 | PACK | Freq: Once | INTRAVENOUS | Status: AC | PRN
  Administered 2018-05-14: 21.9 via INTRAVENOUS

## 2018-05-15 DIAGNOSIS — N186 End stage renal disease: Secondary | ICD-10-CM | POA: Diagnosis not present

## 2018-05-15 DIAGNOSIS — N2581 Secondary hyperparathyroidism of renal origin: Secondary | ICD-10-CM | POA: Diagnosis not present

## 2018-05-15 DIAGNOSIS — D631 Anemia in chronic kidney disease: Secondary | ICD-10-CM | POA: Diagnosis not present

## 2018-05-17 DIAGNOSIS — N2581 Secondary hyperparathyroidism of renal origin: Secondary | ICD-10-CM | POA: Diagnosis not present

## 2018-05-17 DIAGNOSIS — D631 Anemia in chronic kidney disease: Secondary | ICD-10-CM | POA: Diagnosis not present

## 2018-05-17 DIAGNOSIS — N186 End stage renal disease: Secondary | ICD-10-CM | POA: Diagnosis not present

## 2018-05-20 DIAGNOSIS — D631 Anemia in chronic kidney disease: Secondary | ICD-10-CM | POA: Diagnosis not present

## 2018-05-20 DIAGNOSIS — N2581 Secondary hyperparathyroidism of renal origin: Secondary | ICD-10-CM | POA: Diagnosis not present

## 2018-05-20 DIAGNOSIS — N186 End stage renal disease: Secondary | ICD-10-CM | POA: Diagnosis not present

## 2018-05-22 DIAGNOSIS — N186 End stage renal disease: Secondary | ICD-10-CM | POA: Diagnosis not present

## 2018-05-22 DIAGNOSIS — N2581 Secondary hyperparathyroidism of renal origin: Secondary | ICD-10-CM | POA: Diagnosis not present

## 2018-05-22 DIAGNOSIS — D631 Anemia in chronic kidney disease: Secondary | ICD-10-CM | POA: Diagnosis not present

## 2018-05-24 DIAGNOSIS — D631 Anemia in chronic kidney disease: Secondary | ICD-10-CM | POA: Diagnosis not present

## 2018-05-24 DIAGNOSIS — N186 End stage renal disease: Secondary | ICD-10-CM | POA: Diagnosis not present

## 2018-05-24 DIAGNOSIS — N2581 Secondary hyperparathyroidism of renal origin: Secondary | ICD-10-CM | POA: Diagnosis not present

## 2018-05-27 DIAGNOSIS — N2581 Secondary hyperparathyroidism of renal origin: Secondary | ICD-10-CM | POA: Diagnosis not present

## 2018-05-27 DIAGNOSIS — D631 Anemia in chronic kidney disease: Secondary | ICD-10-CM | POA: Diagnosis not present

## 2018-05-27 DIAGNOSIS — N186 End stage renal disease: Secondary | ICD-10-CM | POA: Diagnosis not present

## 2018-05-29 DIAGNOSIS — D631 Anemia in chronic kidney disease: Secondary | ICD-10-CM | POA: Diagnosis not present

## 2018-05-29 DIAGNOSIS — N186 End stage renal disease: Secondary | ICD-10-CM | POA: Diagnosis not present

## 2018-05-29 DIAGNOSIS — N2581 Secondary hyperparathyroidism of renal origin: Secondary | ICD-10-CM | POA: Diagnosis not present

## 2018-05-31 DIAGNOSIS — N186 End stage renal disease: Secondary | ICD-10-CM | POA: Diagnosis not present

## 2018-05-31 DIAGNOSIS — D631 Anemia in chronic kidney disease: Secondary | ICD-10-CM | POA: Diagnosis not present

## 2018-05-31 DIAGNOSIS — N2581 Secondary hyperparathyroidism of renal origin: Secondary | ICD-10-CM | POA: Diagnosis not present

## 2018-06-03 DIAGNOSIS — N186 End stage renal disease: Secondary | ICD-10-CM | POA: Diagnosis not present

## 2018-06-03 DIAGNOSIS — N2581 Secondary hyperparathyroidism of renal origin: Secondary | ICD-10-CM | POA: Diagnosis not present

## 2018-06-03 DIAGNOSIS — D631 Anemia in chronic kidney disease: Secondary | ICD-10-CM | POA: Diagnosis not present

## 2018-06-04 DIAGNOSIS — T82858A Stenosis of vascular prosthetic devices, implants and grafts, initial encounter: Secondary | ICD-10-CM | POA: Diagnosis not present

## 2018-06-04 DIAGNOSIS — Z992 Dependence on renal dialysis: Secondary | ICD-10-CM | POA: Diagnosis not present

## 2018-06-04 DIAGNOSIS — I871 Compression of vein: Secondary | ICD-10-CM | POA: Diagnosis not present

## 2018-06-04 DIAGNOSIS — N186 End stage renal disease: Secondary | ICD-10-CM | POA: Diagnosis not present

## 2018-06-05 DIAGNOSIS — D631 Anemia in chronic kidney disease: Secondary | ICD-10-CM | POA: Diagnosis not present

## 2018-06-05 DIAGNOSIS — N2581 Secondary hyperparathyroidism of renal origin: Secondary | ICD-10-CM | POA: Diagnosis not present

## 2018-06-05 DIAGNOSIS — N186 End stage renal disease: Secondary | ICD-10-CM | POA: Diagnosis not present

## 2018-06-06 DIAGNOSIS — N186 End stage renal disease: Secondary | ICD-10-CM | POA: Diagnosis not present

## 2018-06-06 DIAGNOSIS — I158 Other secondary hypertension: Secondary | ICD-10-CM | POA: Diagnosis not present

## 2018-06-06 DIAGNOSIS — Z992 Dependence on renal dialysis: Secondary | ICD-10-CM | POA: Diagnosis not present

## 2018-06-07 DIAGNOSIS — N186 End stage renal disease: Secondary | ICD-10-CM | POA: Diagnosis not present

## 2018-06-07 DIAGNOSIS — D631 Anemia in chronic kidney disease: Secondary | ICD-10-CM | POA: Diagnosis not present

## 2018-06-07 DIAGNOSIS — N2581 Secondary hyperparathyroidism of renal origin: Secondary | ICD-10-CM | POA: Diagnosis not present

## 2018-06-10 DIAGNOSIS — N186 End stage renal disease: Secondary | ICD-10-CM | POA: Diagnosis not present

## 2018-06-10 DIAGNOSIS — D631 Anemia in chronic kidney disease: Secondary | ICD-10-CM | POA: Diagnosis not present

## 2018-06-10 DIAGNOSIS — N2581 Secondary hyperparathyroidism of renal origin: Secondary | ICD-10-CM | POA: Diagnosis not present

## 2018-06-12 DIAGNOSIS — N186 End stage renal disease: Secondary | ICD-10-CM | POA: Diagnosis not present

## 2018-06-12 DIAGNOSIS — N2581 Secondary hyperparathyroidism of renal origin: Secondary | ICD-10-CM | POA: Diagnosis not present

## 2018-06-12 DIAGNOSIS — D631 Anemia in chronic kidney disease: Secondary | ICD-10-CM | POA: Diagnosis not present

## 2018-06-14 DIAGNOSIS — D631 Anemia in chronic kidney disease: Secondary | ICD-10-CM | POA: Diagnosis not present

## 2018-06-14 DIAGNOSIS — N2581 Secondary hyperparathyroidism of renal origin: Secondary | ICD-10-CM | POA: Diagnosis not present

## 2018-06-14 DIAGNOSIS — N186 End stage renal disease: Secondary | ICD-10-CM | POA: Diagnosis not present

## 2018-06-17 DIAGNOSIS — D631 Anemia in chronic kidney disease: Secondary | ICD-10-CM | POA: Diagnosis not present

## 2018-06-17 DIAGNOSIS — N2581 Secondary hyperparathyroidism of renal origin: Secondary | ICD-10-CM | POA: Diagnosis not present

## 2018-06-17 DIAGNOSIS — N186 End stage renal disease: Secondary | ICD-10-CM | POA: Diagnosis not present

## 2018-06-18 DIAGNOSIS — T82858A Stenosis of vascular prosthetic devices, implants and grafts, initial encounter: Secondary | ICD-10-CM | POA: Diagnosis not present

## 2018-06-18 DIAGNOSIS — N186 End stage renal disease: Secondary | ICD-10-CM | POA: Diagnosis not present

## 2018-06-18 DIAGNOSIS — I871 Compression of vein: Secondary | ICD-10-CM | POA: Diagnosis not present

## 2018-06-18 DIAGNOSIS — Z992 Dependence on renal dialysis: Secondary | ICD-10-CM | POA: Diagnosis not present

## 2018-06-19 DIAGNOSIS — D631 Anemia in chronic kidney disease: Secondary | ICD-10-CM | POA: Diagnosis not present

## 2018-06-19 DIAGNOSIS — N186 End stage renal disease: Secondary | ICD-10-CM | POA: Diagnosis not present

## 2018-06-19 DIAGNOSIS — N2581 Secondary hyperparathyroidism of renal origin: Secondary | ICD-10-CM | POA: Diagnosis not present

## 2018-06-20 ENCOUNTER — Ambulatory Visit (INDEPENDENT_AMBULATORY_CARE_PROVIDER_SITE_OTHER): Payer: Medicare Other | Admitting: Adult Health

## 2018-06-20 ENCOUNTER — Encounter: Payer: Self-pay | Admitting: Adult Health

## 2018-06-20 VITALS — BP 126/61 | HR 87 | Wt 148.0 lb

## 2018-06-20 DIAGNOSIS — I10 Essential (primary) hypertension: Secondary | ICD-10-CM | POA: Diagnosis not present

## 2018-06-20 DIAGNOSIS — E785 Hyperlipidemia, unspecified: Secondary | ICD-10-CM | POA: Diagnosis not present

## 2018-06-20 DIAGNOSIS — I63311 Cerebral infarction due to thrombosis of right middle cerebral artery: Secondary | ICD-10-CM

## 2018-06-20 DIAGNOSIS — I951 Orthostatic hypotension: Secondary | ICD-10-CM | POA: Diagnosis not present

## 2018-06-20 MED ORDER — MIDODRINE HCL 10 MG PO TABS: 10.0000 mg | ORAL_TABLET | Freq: Three times a day (TID) | ORAL | 3 refills | Status: DC

## 2018-06-20 NOTE — Patient Instructions (Signed)
Continue aspirin 81 mg daily  and crestor  for secondary stroke prevention  Continue to follow up with PCP regarding cholesterol and blood pressure management along with concerns of gradual hearing loss  Continue HD treatments and follow up with nephrology   Continue to monitor blood pressure at home  Maintain strict control of hypertension with blood pressure goal below 130/90, diabetes with hemoglobin A1c goal below 6.5% and cholesterol with LDL cholesterol (bad cholesterol) goal below 70 mg/dL. I also advised the patient to eat a healthy diet with plenty of whole grains, cereals, fruits and vegetables, exercise regularly and maintain ideal body weight.  Followup in the future with me as needed or call earlier if needed       Thank you for coming to see [place] at [facility]. I hope we have been able to provide you high quality care today.  You may receive a patient satisfaction survey over the next few weeks. We would appreciate your feedback and comments so that we may continue to improve ourselves and the health of our patients.

## 2018-06-20 NOTE — Progress Notes (Signed)
I agree with the above plan 

## 2018-06-20 NOTE — Progress Notes (Signed)
STROKE NEUROLOGY FOLLOW UP NOTE  NAME: [name] DOB: Feb 25, 1929  REASON FOR VISIT: stroke follow up HISTORY FROM: wife and chart  Today we had the pleasure of seeing [name] in follow-up at our Neurology Clinic. Pt was accompanied by wife.   Chief Complaint  Patient presents with  . Follow-up    Stroke follow up room 9 pt with wife fannie     History Summary Mr. [name] is a 84 y.o. male with history of ESRD on hemodialysis, colonic diverticulosis with LGIB requiring blood transfusion, hypertension and hyperlipidemia was admitted on 07/05/15 for left facial droop, weakness and fatigue after a brief episode of loss of consciousness. MRI showed left centrum semiovale subcortical infarct, likely due to small vessel disease. Stroke work up including TCD, TTE, CUS, venous doppler UE and LE, A1C all negative. LDL 98. Pt had several episodes of GIB with anemia requiring blood transfusion so that ASA was on hold initially. EGD/colonoscopy on 8/12 was negative, and Capsule Endoscopy negative as well. GI suspects a stuttering diverticular bleed. ASA was resumed after GI agreement. Pt was discharged with ASA and crestor.  02/19/18 visit JX: During the interval time, pt has been doing well from stroke standpoint. His Hb stable on checking every week, still on ASA and crestor without difficulty. However, pt and family complains that his BP drops to 80s frequently during dialysis and he has to put on supine position frequently for hypotension. After dialysis, his BP still low at home at 100s. He became lethargic and dizziness on get up at home frequently. He misses his midodrine frequently too due to dialysis schedule and lethargy needing sleep. BP, however, today 124/58 in clinic. Wife also complains of short memory loss but convinced that it is related to his low BP.   Interval History 06/20/18: Patient returns today for follow-up visit and is accompanied by his wife.  He continues to  do well from a stroke standpoint.  He continues to take midodrine 10 mg 3 times daily as recommended at previous visit and states this been helping with blood pressure control.  Blood pressure at today's visit 126/61.  He does continue to undergo HD treatments every Monday Wednesday Friday.  He continues aspirin without side effects of bleeding or bruising.  Continues Crestor without side effects myalgias and lipid levels are monitored by PCP.  Patient only complaint at today's visit is increase in hearing loss but states this has been gradual throughout the years.  Wife is planning on looking into possibly obtaining hearing aids if this is needed and it was recommended to follow-up with PCP in this regards.  Denies new or worsening stroke/TIA symptoms.   REVIEW OF SYSTEMS: Full 14 system review of systems performed and notable only for those listed below and in HPI above, all others are negative:  Hearing loss, memory loss, and muscle cramps  The following represents the patient's updated allergies and side effects list: No Known Allergies  The neurologically relevant items on the patient's problem list were reviewed on today's visit.  Neurologic Examination  A problem focused neurological exam (12 or more points of the single system neurologic examination, vital signs counts as 1 point, cranial nerves count for 8 points) was performed.  Blood pressure 126/61, pulse 87, weight 148 lb (67.1 kg).  General - Well nourished, well developed, pleasant African-American elderly male, in no apparent distress.  Ophthalmologic - Fundi not visualized due to eye movement.  Cardiovascular - Regular  rate and rhythm.  Mental Status -  Level of arousal and orientation to time, place, and person were intact. Language including expression, naming, repetition, comprehension was assessed and found intact. Fund of Knowledge was assessed and was intact impaired.  Cranial Nerves II - XII - II - Visual field  intact OU. III, IV, VI - Extraocular movements intact. V - Facial sensation intact bilaterally. VII - facial symmetrical VIII - Hearing & vestibular intact bilaterally. X - Palate elevates symmetrically. XI - Chin turning & shoulder shrug intact bilaterally. XII - Tongue protrusion intact.  Motor Strength - The patient's strength was normal in all extremities and pronator drift was absent.  Bulk was normal and fasciculations were absent.   Motor Tone - Muscle tone was assessed at the neck and appendages and was normal.  Reflexes - The patient's reflexes were 1+ in all extremities and he had no pathological reflexes.  Sensory - Light touch, temperature/pinprick were assessed and were normal.    Coordination - The patient had normal movements in the hands and feet with no ataxia or dysmetria.  Tremor was absent.  Gait and Station - walk without cane, mildly stooped posturing, slow and small stride.    Assessment: [name] is an 84 year old African-American male with history of left centrum semioval subcortical infarct on 07/05/2015 secondary to small vessel disease.  Vascular risk factors include HLD, ESRD on HD and HTN.  Patient is being seen today for scheduled follow-up visit and overall continues to do well from a stroke standpoint.      Plan:  - continue ASA EC 81mg  and crestor for stroke prevention - continue follow up with nephrology for continued HD with ESRD - check BP at home and BP goal 110-130 -Advised to continue midodrine 10 mg 3 times daily for blood pressure management -additional refill was provided at this time but advised to follow-up with nephrologist/PCP in regards to future prescribing -Continue to follow with PCP in regards to HLD and HTN management along with concerns of gradual hearing loss - Follow up with your primary care physician for stroke risk factor modification. Recommend maintain blood pressure goal 110-130/80, diabetes with hemoglobin A1c goal  below 6.5% and lipids with LDL cholesterol goal below 70 mg/dL.   As patient is doing well from a stroke standpoint, recommended to follow-up only as needed or call with questions/concerns  I spent more than 25 minutes of face to face time with the patient. Greater than 50% of time was spent in counseling and coordination of care. We have discussed about BP management, more fluid intake and increase midodrine dose.  Venancio Poisson, AGNP-BC  [facility] [address] [place], [place] [address]  Phone [phone] Fax [phone] Note: This document was prepared with digital dictation and possible smart phrase technology. Any transcriptional errors that result from this process are unintentional.

## 2018-06-21 DIAGNOSIS — N2581 Secondary hyperparathyroidism of renal origin: Secondary | ICD-10-CM | POA: Diagnosis not present

## 2018-06-21 DIAGNOSIS — N186 End stage renal disease: Secondary | ICD-10-CM | POA: Diagnosis not present

## 2018-06-21 DIAGNOSIS — D631 Anemia in chronic kidney disease: Secondary | ICD-10-CM | POA: Diagnosis not present

## 2018-06-24 DIAGNOSIS — N2581 Secondary hyperparathyroidism of renal origin: Secondary | ICD-10-CM | POA: Diagnosis not present

## 2018-06-24 DIAGNOSIS — D631 Anemia in chronic kidney disease: Secondary | ICD-10-CM | POA: Diagnosis not present

## 2018-06-24 DIAGNOSIS — N186 End stage renal disease: Secondary | ICD-10-CM | POA: Diagnosis not present

## 2018-06-26 DIAGNOSIS — N186 End stage renal disease: Secondary | ICD-10-CM | POA: Diagnosis not present

## 2018-06-26 DIAGNOSIS — D631 Anemia in chronic kidney disease: Secondary | ICD-10-CM | POA: Diagnosis not present

## 2018-06-26 DIAGNOSIS — N2581 Secondary hyperparathyroidism of renal origin: Secondary | ICD-10-CM | POA: Diagnosis not present

## 2018-06-28 DIAGNOSIS — D631 Anemia in chronic kidney disease: Secondary | ICD-10-CM | POA: Diagnosis not present

## 2018-06-28 DIAGNOSIS — N186 End stage renal disease: Secondary | ICD-10-CM | POA: Diagnosis not present

## 2018-06-28 DIAGNOSIS — N2581 Secondary hyperparathyroidism of renal origin: Secondary | ICD-10-CM | POA: Diagnosis not present

## 2018-07-01 DIAGNOSIS — N2581 Secondary hyperparathyroidism of renal origin: Secondary | ICD-10-CM | POA: Diagnosis not present

## 2018-07-01 DIAGNOSIS — N186 End stage renal disease: Secondary | ICD-10-CM | POA: Diagnosis not present

## 2018-07-01 DIAGNOSIS — D631 Anemia in chronic kidney disease: Secondary | ICD-10-CM | POA: Diagnosis not present

## 2018-07-03 DIAGNOSIS — N186 End stage renal disease: Secondary | ICD-10-CM | POA: Diagnosis not present

## 2018-07-03 DIAGNOSIS — D631 Anemia in chronic kidney disease: Secondary | ICD-10-CM | POA: Diagnosis not present

## 2018-07-03 DIAGNOSIS — N2581 Secondary hyperparathyroidism of renal origin: Secondary | ICD-10-CM | POA: Diagnosis not present

## 2018-07-05 DIAGNOSIS — D631 Anemia in chronic kidney disease: Secondary | ICD-10-CM | POA: Diagnosis not present

## 2018-07-05 DIAGNOSIS — N186 End stage renal disease: Secondary | ICD-10-CM | POA: Diagnosis not present

## 2018-07-05 DIAGNOSIS — N2581 Secondary hyperparathyroidism of renal origin: Secondary | ICD-10-CM | POA: Diagnosis not present

## 2018-07-07 DIAGNOSIS — N186 End stage renal disease: Secondary | ICD-10-CM | POA: Diagnosis not present

## 2018-07-07 DIAGNOSIS — Z992 Dependence on renal dialysis: Secondary | ICD-10-CM | POA: Diagnosis not present

## 2018-07-07 DIAGNOSIS — I158 Other secondary hypertension: Secondary | ICD-10-CM | POA: Diagnosis not present

## 2018-07-08 DIAGNOSIS — N2581 Secondary hyperparathyroidism of renal origin: Secondary | ICD-10-CM | POA: Diagnosis not present

## 2018-07-08 DIAGNOSIS — D631 Anemia in chronic kidney disease: Secondary | ICD-10-CM | POA: Diagnosis not present

## 2018-07-08 DIAGNOSIS — N186 End stage renal disease: Secondary | ICD-10-CM | POA: Diagnosis not present

## 2018-07-08 DIAGNOSIS — Z23 Encounter for immunization: Secondary | ICD-10-CM | POA: Diagnosis not present

## 2018-07-09 DIAGNOSIS — H401131 Primary open-angle glaucoma, bilateral, mild stage: Secondary | ICD-10-CM | POA: Diagnosis not present

## 2018-07-10 DIAGNOSIS — N2581 Secondary hyperparathyroidism of renal origin: Secondary | ICD-10-CM | POA: Diagnosis not present

## 2018-07-10 DIAGNOSIS — N186 End stage renal disease: Secondary | ICD-10-CM | POA: Diagnosis not present

## 2018-07-10 DIAGNOSIS — Z23 Encounter for immunization: Secondary | ICD-10-CM | POA: Diagnosis not present

## 2018-07-10 DIAGNOSIS — D631 Anemia in chronic kidney disease: Secondary | ICD-10-CM | POA: Diagnosis not present

## 2018-07-12 DIAGNOSIS — D631 Anemia in chronic kidney disease: Secondary | ICD-10-CM | POA: Diagnosis not present

## 2018-07-12 DIAGNOSIS — N2581 Secondary hyperparathyroidism of renal origin: Secondary | ICD-10-CM | POA: Diagnosis not present

## 2018-07-12 DIAGNOSIS — Z23 Encounter for immunization: Secondary | ICD-10-CM | POA: Diagnosis not present

## 2018-07-12 DIAGNOSIS — N186 End stage renal disease: Secondary | ICD-10-CM | POA: Diagnosis not present

## 2018-07-15 DIAGNOSIS — N186 End stage renal disease: Secondary | ICD-10-CM | POA: Diagnosis not present

## 2018-07-15 DIAGNOSIS — N2581 Secondary hyperparathyroidism of renal origin: Secondary | ICD-10-CM | POA: Diagnosis not present

## 2018-07-15 DIAGNOSIS — Z23 Encounter for immunization: Secondary | ICD-10-CM | POA: Diagnosis not present

## 2018-07-15 DIAGNOSIS — D631 Anemia in chronic kidney disease: Secondary | ICD-10-CM | POA: Diagnosis not present

## 2018-07-17 DIAGNOSIS — Z23 Encounter for immunization: Secondary | ICD-10-CM | POA: Diagnosis not present

## 2018-07-17 DIAGNOSIS — N186 End stage renal disease: Secondary | ICD-10-CM | POA: Diagnosis not present

## 2018-07-17 DIAGNOSIS — D631 Anemia in chronic kidney disease: Secondary | ICD-10-CM | POA: Diagnosis not present

## 2018-07-17 DIAGNOSIS — N2581 Secondary hyperparathyroidism of renal origin: Secondary | ICD-10-CM | POA: Diagnosis not present

## 2018-07-19 DIAGNOSIS — Z23 Encounter for immunization: Secondary | ICD-10-CM | POA: Diagnosis not present

## 2018-07-19 DIAGNOSIS — N186 End stage renal disease: Secondary | ICD-10-CM | POA: Diagnosis not present

## 2018-07-19 DIAGNOSIS — N2581 Secondary hyperparathyroidism of renal origin: Secondary | ICD-10-CM | POA: Diagnosis not present

## 2018-07-19 DIAGNOSIS — D631 Anemia in chronic kidney disease: Secondary | ICD-10-CM | POA: Diagnosis not present

## 2018-07-22 DIAGNOSIS — N186 End stage renal disease: Secondary | ICD-10-CM | POA: Diagnosis not present

## 2018-07-22 DIAGNOSIS — N2581 Secondary hyperparathyroidism of renal origin: Secondary | ICD-10-CM | POA: Diagnosis not present

## 2018-07-22 DIAGNOSIS — D631 Anemia in chronic kidney disease: Secondary | ICD-10-CM | POA: Diagnosis not present

## 2018-07-22 DIAGNOSIS — Z23 Encounter for immunization: Secondary | ICD-10-CM | POA: Diagnosis not present

## 2018-07-24 DIAGNOSIS — D631 Anemia in chronic kidney disease: Secondary | ICD-10-CM | POA: Diagnosis not present

## 2018-07-24 DIAGNOSIS — N2581 Secondary hyperparathyroidism of renal origin: Secondary | ICD-10-CM | POA: Diagnosis not present

## 2018-07-24 DIAGNOSIS — N186 End stage renal disease: Secondary | ICD-10-CM | POA: Diagnosis not present

## 2018-07-24 DIAGNOSIS — Z23 Encounter for immunization: Secondary | ICD-10-CM | POA: Diagnosis not present

## 2018-07-26 DIAGNOSIS — N2581 Secondary hyperparathyroidism of renal origin: Secondary | ICD-10-CM | POA: Diagnosis not present

## 2018-07-26 DIAGNOSIS — N186 End stage renal disease: Secondary | ICD-10-CM | POA: Diagnosis not present

## 2018-07-26 DIAGNOSIS — Z23 Encounter for immunization: Secondary | ICD-10-CM | POA: Diagnosis not present

## 2018-07-26 DIAGNOSIS — D631 Anemia in chronic kidney disease: Secondary | ICD-10-CM | POA: Diagnosis not present

## 2018-07-29 DIAGNOSIS — N186 End stage renal disease: Secondary | ICD-10-CM | POA: Diagnosis not present

## 2018-07-29 DIAGNOSIS — Z23 Encounter for immunization: Secondary | ICD-10-CM | POA: Diagnosis not present

## 2018-07-29 DIAGNOSIS — D631 Anemia in chronic kidney disease: Secondary | ICD-10-CM | POA: Diagnosis not present

## 2018-07-29 DIAGNOSIS — N2581 Secondary hyperparathyroidism of renal origin: Secondary | ICD-10-CM | POA: Diagnosis not present

## 2018-07-31 DIAGNOSIS — D631 Anemia in chronic kidney disease: Secondary | ICD-10-CM | POA: Diagnosis not present

## 2018-07-31 DIAGNOSIS — Z23 Encounter for immunization: Secondary | ICD-10-CM | POA: Diagnosis not present

## 2018-07-31 DIAGNOSIS — N186 End stage renal disease: Secondary | ICD-10-CM | POA: Diagnosis not present

## 2018-07-31 DIAGNOSIS — N2581 Secondary hyperparathyroidism of renal origin: Secondary | ICD-10-CM | POA: Diagnosis not present

## 2018-08-02 DIAGNOSIS — N2581 Secondary hyperparathyroidism of renal origin: Secondary | ICD-10-CM | POA: Diagnosis not present

## 2018-08-02 DIAGNOSIS — Z23 Encounter for immunization: Secondary | ICD-10-CM | POA: Diagnosis not present

## 2018-08-02 DIAGNOSIS — N186 End stage renal disease: Secondary | ICD-10-CM | POA: Diagnosis not present

## 2018-08-02 DIAGNOSIS — D631 Anemia in chronic kidney disease: Secondary | ICD-10-CM | POA: Diagnosis not present

## 2018-08-05 DIAGNOSIS — Z23 Encounter for immunization: Secondary | ICD-10-CM | POA: Diagnosis not present

## 2018-08-05 DIAGNOSIS — N186 End stage renal disease: Secondary | ICD-10-CM | POA: Diagnosis not present

## 2018-08-05 DIAGNOSIS — D631 Anemia in chronic kidney disease: Secondary | ICD-10-CM | POA: Diagnosis not present

## 2018-08-05 DIAGNOSIS — N2581 Secondary hyperparathyroidism of renal origin: Secondary | ICD-10-CM | POA: Diagnosis not present

## 2018-08-06 DIAGNOSIS — I158 Other secondary hypertension: Secondary | ICD-10-CM | POA: Diagnosis not present

## 2018-08-06 DIAGNOSIS — Z992 Dependence on renal dialysis: Secondary | ICD-10-CM | POA: Diagnosis not present

## 2018-08-06 DIAGNOSIS — H6121 Impacted cerumen, right ear: Secondary | ICD-10-CM | POA: Diagnosis not present

## 2018-08-06 DIAGNOSIS — H838X3 Other specified diseases of inner ear, bilateral: Secondary | ICD-10-CM | POA: Diagnosis not present

## 2018-08-06 DIAGNOSIS — H903 Sensorineural hearing loss, bilateral: Secondary | ICD-10-CM | POA: Diagnosis not present

## 2018-08-06 DIAGNOSIS — N186 End stage renal disease: Secondary | ICD-10-CM | POA: Diagnosis not present

## 2018-08-07 DIAGNOSIS — N186 End stage renal disease: Secondary | ICD-10-CM | POA: Diagnosis not present

## 2018-08-07 DIAGNOSIS — D631 Anemia in chronic kidney disease: Secondary | ICD-10-CM | POA: Diagnosis not present

## 2018-08-07 DIAGNOSIS — N2581 Secondary hyperparathyroidism of renal origin: Secondary | ICD-10-CM | POA: Diagnosis not present

## 2018-08-07 DIAGNOSIS — Z23 Encounter for immunization: Secondary | ICD-10-CM | POA: Diagnosis not present

## 2018-08-09 DIAGNOSIS — Z23 Encounter for immunization: Secondary | ICD-10-CM | POA: Diagnosis not present

## 2018-08-09 DIAGNOSIS — N2581 Secondary hyperparathyroidism of renal origin: Secondary | ICD-10-CM | POA: Diagnosis not present

## 2018-08-09 DIAGNOSIS — D631 Anemia in chronic kidney disease: Secondary | ICD-10-CM | POA: Diagnosis not present

## 2018-08-09 DIAGNOSIS — N186 End stage renal disease: Secondary | ICD-10-CM | POA: Diagnosis not present

## 2018-08-12 DIAGNOSIS — Z23 Encounter for immunization: Secondary | ICD-10-CM | POA: Diagnosis not present

## 2018-08-12 DIAGNOSIS — N186 End stage renal disease: Secondary | ICD-10-CM | POA: Diagnosis not present

## 2018-08-12 DIAGNOSIS — N2581 Secondary hyperparathyroidism of renal origin: Secondary | ICD-10-CM | POA: Diagnosis not present

## 2018-08-12 DIAGNOSIS — D631 Anemia in chronic kidney disease: Secondary | ICD-10-CM | POA: Diagnosis not present

## 2018-08-14 DIAGNOSIS — N2581 Secondary hyperparathyroidism of renal origin: Secondary | ICD-10-CM | POA: Diagnosis not present

## 2018-08-14 DIAGNOSIS — Z23 Encounter for immunization: Secondary | ICD-10-CM | POA: Diagnosis not present

## 2018-08-14 DIAGNOSIS — D631 Anemia in chronic kidney disease: Secondary | ICD-10-CM | POA: Diagnosis not present

## 2018-08-14 DIAGNOSIS — N186 End stage renal disease: Secondary | ICD-10-CM | POA: Diagnosis not present

## 2018-08-16 DIAGNOSIS — N2581 Secondary hyperparathyroidism of renal origin: Secondary | ICD-10-CM | POA: Diagnosis not present

## 2018-08-16 DIAGNOSIS — Z23 Encounter for immunization: Secondary | ICD-10-CM | POA: Diagnosis not present

## 2018-08-16 DIAGNOSIS — N186 End stage renal disease: Secondary | ICD-10-CM | POA: Diagnosis not present

## 2018-08-16 DIAGNOSIS — D631 Anemia in chronic kidney disease: Secondary | ICD-10-CM | POA: Diagnosis not present

## 2018-08-19 DIAGNOSIS — Z23 Encounter for immunization: Secondary | ICD-10-CM | POA: Diagnosis not present

## 2018-08-19 DIAGNOSIS — D631 Anemia in chronic kidney disease: Secondary | ICD-10-CM | POA: Diagnosis not present

## 2018-08-19 DIAGNOSIS — N186 End stage renal disease: Secondary | ICD-10-CM | POA: Diagnosis not present

## 2018-08-19 DIAGNOSIS — N2581 Secondary hyperparathyroidism of renal origin: Secondary | ICD-10-CM | POA: Diagnosis not present

## 2018-08-21 DIAGNOSIS — N2581 Secondary hyperparathyroidism of renal origin: Secondary | ICD-10-CM | POA: Diagnosis not present

## 2018-08-21 DIAGNOSIS — Z23 Encounter for immunization: Secondary | ICD-10-CM | POA: Diagnosis not present

## 2018-08-21 DIAGNOSIS — N186 End stage renal disease: Secondary | ICD-10-CM | POA: Diagnosis not present

## 2018-08-21 DIAGNOSIS — D631 Anemia in chronic kidney disease: Secondary | ICD-10-CM | POA: Diagnosis not present

## 2018-08-23 DIAGNOSIS — N186 End stage renal disease: Secondary | ICD-10-CM | POA: Diagnosis not present

## 2018-08-23 DIAGNOSIS — D631 Anemia in chronic kidney disease: Secondary | ICD-10-CM | POA: Diagnosis not present

## 2018-08-23 DIAGNOSIS — Z23 Encounter for immunization: Secondary | ICD-10-CM | POA: Diagnosis not present

## 2018-08-23 DIAGNOSIS — N2581 Secondary hyperparathyroidism of renal origin: Secondary | ICD-10-CM | POA: Diagnosis not present

## 2018-08-26 DIAGNOSIS — N186 End stage renal disease: Secondary | ICD-10-CM | POA: Diagnosis not present

## 2018-08-26 DIAGNOSIS — D631 Anemia in chronic kidney disease: Secondary | ICD-10-CM | POA: Diagnosis not present

## 2018-08-26 DIAGNOSIS — Z23 Encounter for immunization: Secondary | ICD-10-CM | POA: Diagnosis not present

## 2018-08-26 DIAGNOSIS — N2581 Secondary hyperparathyroidism of renal origin: Secondary | ICD-10-CM | POA: Diagnosis not present

## 2018-08-28 DIAGNOSIS — D631 Anemia in chronic kidney disease: Secondary | ICD-10-CM | POA: Diagnosis not present

## 2018-08-28 DIAGNOSIS — N2581 Secondary hyperparathyroidism of renal origin: Secondary | ICD-10-CM | POA: Diagnosis not present

## 2018-08-28 DIAGNOSIS — N186 End stage renal disease: Secondary | ICD-10-CM | POA: Diagnosis not present

## 2018-08-28 DIAGNOSIS — Z23 Encounter for immunization: Secondary | ICD-10-CM | POA: Diagnosis not present

## 2018-08-30 DIAGNOSIS — Z23 Encounter for immunization: Secondary | ICD-10-CM | POA: Diagnosis not present

## 2018-08-30 DIAGNOSIS — D631 Anemia in chronic kidney disease: Secondary | ICD-10-CM | POA: Diagnosis not present

## 2018-08-30 DIAGNOSIS — N186 End stage renal disease: Secondary | ICD-10-CM | POA: Diagnosis not present

## 2018-08-30 DIAGNOSIS — N2581 Secondary hyperparathyroidism of renal origin: Secondary | ICD-10-CM | POA: Diagnosis not present

## 2018-09-02 DIAGNOSIS — N186 End stage renal disease: Secondary | ICD-10-CM | POA: Diagnosis not present

## 2018-09-02 DIAGNOSIS — D631 Anemia in chronic kidney disease: Secondary | ICD-10-CM | POA: Diagnosis not present

## 2018-09-02 DIAGNOSIS — N2581 Secondary hyperparathyroidism of renal origin: Secondary | ICD-10-CM | POA: Diagnosis not present

## 2018-09-02 DIAGNOSIS — Z23 Encounter for immunization: Secondary | ICD-10-CM | POA: Diagnosis not present

## 2018-09-04 DIAGNOSIS — D631 Anemia in chronic kidney disease: Secondary | ICD-10-CM | POA: Diagnosis not present

## 2018-09-04 DIAGNOSIS — Z23 Encounter for immunization: Secondary | ICD-10-CM | POA: Diagnosis not present

## 2018-09-04 DIAGNOSIS — N186 End stage renal disease: Secondary | ICD-10-CM | POA: Diagnosis not present

## 2018-09-04 DIAGNOSIS — N2581 Secondary hyperparathyroidism of renal origin: Secondary | ICD-10-CM | POA: Diagnosis not present

## 2018-09-06 DIAGNOSIS — Z23 Encounter for immunization: Secondary | ICD-10-CM | POA: Diagnosis not present

## 2018-09-06 DIAGNOSIS — N2581 Secondary hyperparathyroidism of renal origin: Secondary | ICD-10-CM | POA: Diagnosis not present

## 2018-09-06 DIAGNOSIS — D631 Anemia in chronic kidney disease: Secondary | ICD-10-CM | POA: Diagnosis not present

## 2018-09-06 DIAGNOSIS — N186 End stage renal disease: Secondary | ICD-10-CM | POA: Diagnosis not present

## 2018-09-06 DIAGNOSIS — Z992 Dependence on renal dialysis: Secondary | ICD-10-CM | POA: Diagnosis not present

## 2018-09-06 DIAGNOSIS — I158 Other secondary hypertension: Secondary | ICD-10-CM | POA: Diagnosis not present

## 2018-09-09 DIAGNOSIS — D631 Anemia in chronic kidney disease: Secondary | ICD-10-CM | POA: Diagnosis not present

## 2018-09-09 DIAGNOSIS — N186 End stage renal disease: Secondary | ICD-10-CM | POA: Diagnosis not present

## 2018-09-09 DIAGNOSIS — N2581 Secondary hyperparathyroidism of renal origin: Secondary | ICD-10-CM | POA: Diagnosis not present

## 2018-09-09 DIAGNOSIS — Z23 Encounter for immunization: Secondary | ICD-10-CM | POA: Diagnosis not present

## 2018-09-11 DIAGNOSIS — D631 Anemia in chronic kidney disease: Secondary | ICD-10-CM | POA: Diagnosis not present

## 2018-09-11 DIAGNOSIS — N2581 Secondary hyperparathyroidism of renal origin: Secondary | ICD-10-CM | POA: Diagnosis not present

## 2018-09-11 DIAGNOSIS — Z23 Encounter for immunization: Secondary | ICD-10-CM | POA: Diagnosis not present

## 2018-09-11 DIAGNOSIS — N186 End stage renal disease: Secondary | ICD-10-CM | POA: Diagnosis not present

## 2018-09-13 DIAGNOSIS — N2581 Secondary hyperparathyroidism of renal origin: Secondary | ICD-10-CM | POA: Diagnosis not present

## 2018-09-13 DIAGNOSIS — Z23 Encounter for immunization: Secondary | ICD-10-CM | POA: Diagnosis not present

## 2018-09-13 DIAGNOSIS — N186 End stage renal disease: Secondary | ICD-10-CM | POA: Diagnosis not present

## 2018-09-13 DIAGNOSIS — D631 Anemia in chronic kidney disease: Secondary | ICD-10-CM | POA: Diagnosis not present

## 2018-09-16 DIAGNOSIS — N2581 Secondary hyperparathyroidism of renal origin: Secondary | ICD-10-CM | POA: Diagnosis not present

## 2018-09-16 DIAGNOSIS — N186 End stage renal disease: Secondary | ICD-10-CM | POA: Diagnosis not present

## 2018-09-16 DIAGNOSIS — D631 Anemia in chronic kidney disease: Secondary | ICD-10-CM | POA: Diagnosis not present

## 2018-09-16 DIAGNOSIS — Z23 Encounter for immunization: Secondary | ICD-10-CM | POA: Diagnosis not present

## 2018-09-18 DIAGNOSIS — Z23 Encounter for immunization: Secondary | ICD-10-CM | POA: Diagnosis not present

## 2018-09-18 DIAGNOSIS — N2581 Secondary hyperparathyroidism of renal origin: Secondary | ICD-10-CM | POA: Diagnosis not present

## 2018-09-18 DIAGNOSIS — N186 End stage renal disease: Secondary | ICD-10-CM | POA: Diagnosis not present

## 2018-09-18 DIAGNOSIS — D631 Anemia in chronic kidney disease: Secondary | ICD-10-CM | POA: Diagnosis not present

## 2018-09-20 DIAGNOSIS — D631 Anemia in chronic kidney disease: Secondary | ICD-10-CM | POA: Diagnosis not present

## 2018-09-20 DIAGNOSIS — Z23 Encounter for immunization: Secondary | ICD-10-CM | POA: Diagnosis not present

## 2018-09-20 DIAGNOSIS — N2581 Secondary hyperparathyroidism of renal origin: Secondary | ICD-10-CM | POA: Diagnosis not present

## 2018-09-20 DIAGNOSIS — N186 End stage renal disease: Secondary | ICD-10-CM | POA: Diagnosis not present

## 2018-09-23 DIAGNOSIS — N2581 Secondary hyperparathyroidism of renal origin: Secondary | ICD-10-CM | POA: Diagnosis not present

## 2018-09-23 DIAGNOSIS — N186 End stage renal disease: Secondary | ICD-10-CM | POA: Diagnosis not present

## 2018-09-23 DIAGNOSIS — D631 Anemia in chronic kidney disease: Secondary | ICD-10-CM | POA: Diagnosis not present

## 2018-09-23 DIAGNOSIS — Z23 Encounter for immunization: Secondary | ICD-10-CM | POA: Diagnosis not present

## 2018-09-25 DIAGNOSIS — Z23 Encounter for immunization: Secondary | ICD-10-CM | POA: Diagnosis not present

## 2018-09-25 DIAGNOSIS — N186 End stage renal disease: Secondary | ICD-10-CM | POA: Diagnosis not present

## 2018-09-25 DIAGNOSIS — N2581 Secondary hyperparathyroidism of renal origin: Secondary | ICD-10-CM | POA: Diagnosis not present

## 2018-09-25 DIAGNOSIS — D631 Anemia in chronic kidney disease: Secondary | ICD-10-CM | POA: Diagnosis not present

## 2018-09-27 DIAGNOSIS — Z23 Encounter for immunization: Secondary | ICD-10-CM | POA: Diagnosis not present

## 2018-09-27 DIAGNOSIS — N2581 Secondary hyperparathyroidism of renal origin: Secondary | ICD-10-CM | POA: Diagnosis not present

## 2018-09-27 DIAGNOSIS — N186 End stage renal disease: Secondary | ICD-10-CM | POA: Diagnosis not present

## 2018-09-27 DIAGNOSIS — D631 Anemia in chronic kidney disease: Secondary | ICD-10-CM | POA: Diagnosis not present

## 2018-09-29 DIAGNOSIS — N2581 Secondary hyperparathyroidism of renal origin: Secondary | ICD-10-CM | POA: Diagnosis not present

## 2018-09-29 DIAGNOSIS — Z23 Encounter for immunization: Secondary | ICD-10-CM | POA: Diagnosis not present

## 2018-09-29 DIAGNOSIS — N186 End stage renal disease: Secondary | ICD-10-CM | POA: Diagnosis not present

## 2018-09-29 DIAGNOSIS — D631 Anemia in chronic kidney disease: Secondary | ICD-10-CM | POA: Diagnosis not present

## 2018-10-01 DIAGNOSIS — D631 Anemia in chronic kidney disease: Secondary | ICD-10-CM | POA: Diagnosis not present

## 2018-10-01 DIAGNOSIS — N2581 Secondary hyperparathyroidism of renal origin: Secondary | ICD-10-CM | POA: Diagnosis not present

## 2018-10-01 DIAGNOSIS — Z23 Encounter for immunization: Secondary | ICD-10-CM | POA: Diagnosis not present

## 2018-10-01 DIAGNOSIS — N186 End stage renal disease: Secondary | ICD-10-CM | POA: Diagnosis not present

## 2018-10-04 DIAGNOSIS — D631 Anemia in chronic kidney disease: Secondary | ICD-10-CM | POA: Diagnosis not present

## 2018-10-04 DIAGNOSIS — Z23 Encounter for immunization: Secondary | ICD-10-CM | POA: Diagnosis not present

## 2018-10-04 DIAGNOSIS — N186 End stage renal disease: Secondary | ICD-10-CM | POA: Diagnosis not present

## 2018-10-04 DIAGNOSIS — N2581 Secondary hyperparathyroidism of renal origin: Secondary | ICD-10-CM | POA: Diagnosis not present

## 2018-10-06 DIAGNOSIS — I158 Other secondary hypertension: Secondary | ICD-10-CM | POA: Diagnosis not present

## 2018-10-06 DIAGNOSIS — Z992 Dependence on renal dialysis: Secondary | ICD-10-CM | POA: Diagnosis not present

## 2018-10-06 DIAGNOSIS — N186 End stage renal disease: Secondary | ICD-10-CM | POA: Diagnosis not present

## 2018-10-07 DIAGNOSIS — N186 End stage renal disease: Secondary | ICD-10-CM | POA: Diagnosis not present

## 2018-10-07 DIAGNOSIS — D631 Anemia in chronic kidney disease: Secondary | ICD-10-CM | POA: Diagnosis not present

## 2018-10-07 DIAGNOSIS — D509 Iron deficiency anemia, unspecified: Secondary | ICD-10-CM | POA: Diagnosis not present

## 2018-10-07 DIAGNOSIS — N2581 Secondary hyperparathyroidism of renal origin: Secondary | ICD-10-CM | POA: Diagnosis not present

## 2018-10-07 DIAGNOSIS — Z23 Encounter for immunization: Secondary | ICD-10-CM | POA: Diagnosis not present

## 2018-10-08 DIAGNOSIS — H401131 Primary open-angle glaucoma, bilateral, mild stage: Secondary | ICD-10-CM | POA: Diagnosis not present

## 2018-10-08 DIAGNOSIS — C61 Malignant neoplasm of prostate: Secondary | ICD-10-CM | POA: Diagnosis not present

## 2018-10-09 DIAGNOSIS — N2581 Secondary hyperparathyroidism of renal origin: Secondary | ICD-10-CM | POA: Diagnosis not present

## 2018-10-09 DIAGNOSIS — D631 Anemia in chronic kidney disease: Secondary | ICD-10-CM | POA: Diagnosis not present

## 2018-10-09 DIAGNOSIS — Z23 Encounter for immunization: Secondary | ICD-10-CM | POA: Diagnosis not present

## 2018-10-09 DIAGNOSIS — N186 End stage renal disease: Secondary | ICD-10-CM | POA: Diagnosis not present

## 2018-10-09 DIAGNOSIS — D509 Iron deficiency anemia, unspecified: Secondary | ICD-10-CM | POA: Diagnosis not present

## 2018-10-10 ENCOUNTER — Other Ambulatory Visit (INDEPENDENT_AMBULATORY_CARE_PROVIDER_SITE_OTHER): Payer: Medicare Other

## 2018-10-10 ENCOUNTER — Encounter: Payer: Self-pay | Admitting: Internal Medicine

## 2018-10-10 ENCOUNTER — Telehealth: Payer: Self-pay | Admitting: Internal Medicine

## 2018-10-10 ENCOUNTER — Ambulatory Visit (INDEPENDENT_AMBULATORY_CARE_PROVIDER_SITE_OTHER): Payer: Medicare Other | Admitting: Internal Medicine

## 2018-10-10 VITALS — BP 102/64 | HR 56 | Temp 97.7°F | Ht 69.0 in | Wt 145.0 lb

## 2018-10-10 DIAGNOSIS — Z992 Dependence on renal dialysis: Secondary | ICD-10-CM

## 2018-10-10 DIAGNOSIS — I63311 Cerebral infarction due to thrombosis of right middle cerebral artery: Secondary | ICD-10-CM

## 2018-10-10 DIAGNOSIS — I951 Orthostatic hypotension: Secondary | ICD-10-CM | POA: Diagnosis not present

## 2018-10-10 DIAGNOSIS — N186 End stage renal disease: Secondary | ICD-10-CM

## 2018-10-10 DIAGNOSIS — E785 Hyperlipidemia, unspecified: Secondary | ICD-10-CM

## 2018-10-10 DIAGNOSIS — D631 Anemia in chronic kidney disease: Secondary | ICD-10-CM

## 2018-10-10 DIAGNOSIS — I499 Cardiac arrhythmia, unspecified: Secondary | ICD-10-CM

## 2018-10-10 LAB — BASIC METABOLIC PANEL
BUN: 30 mg/dL — ABNORMAL HIGH (ref 6–23)
CALCIUM: 10.1 mg/dL (ref 8.4–10.5)
CO2: 35 mEq/L — ABNORMAL HIGH (ref 19–32)
Chloride: 90 mEq/L — ABNORMAL LOW (ref 96–112)
Creatinine, Ser: 7.21 mg/dL (ref 0.40–1.50)
GFR: 9.29 mL/min — CL (ref >60.00)
GLUCOSE: 96 mg/dL (ref 70–99)
Potassium: 3.6 mEq/L (ref 3.5–5.1)
Sodium: 138 mEq/L (ref 135–145)

## 2018-10-10 LAB — CBC WITH DIFFERENTIAL/PLATELET
BASOS ABS: 0 10*3/uL (ref 0.0–0.1)
Basophils Relative: 0.4 % (ref 0.0–3.0)
EOS ABS: 0.3 10*3/uL (ref 0.0–0.7)
Eosinophils Relative: 2.8 % (ref 0.0–5.0)
HCT: 31.1 % — ABNORMAL LOW (ref 39.0–52.0)
Hemoglobin: 10.4 g/dL — ABNORMAL LOW (ref 13.0–17.0)
Lymphocytes Relative: 6.7 % — ABNORMAL LOW (ref 12.0–46.0)
Lymphs Abs: 0.7 10*3/uL (ref 0.7–4.0)
MCHC: 33.5 g/dL (ref 30.0–36.0)
MCV: 96.9 fl (ref 78.0–100.0)
Monocytes Absolute: 1.2 10*3/uL — ABNORMAL HIGH (ref 0.1–1.0)
Monocytes Relative: 11.3 % (ref 3.0–12.0)
Neutro Abs: 8.3 10*3/uL — ABNORMAL HIGH (ref 1.4–7.7)
Neutrophils Relative %: 78.8 % — ABNORMAL HIGH (ref 43.0–77.0)
Platelets: 166 10*3/uL (ref 150.0–400.0)
RBC: 3.21 Mil/uL — ABNORMAL LOW (ref 4.22–5.81)
RDW: 17.7 % — ABNORMAL HIGH (ref 11.5–15.5)
WBC: 10.6 10*3/uL — ABNORMAL HIGH (ref 4.0–10.5)

## 2018-10-10 LAB — HEPATIC FUNCTION PANEL
ALK PHOS: 81 U/L (ref 39–117)
ALT: 26 U/L (ref 0–53)
AST: 33 U/L (ref 0–37)
Albumin: 4.5 g/dL (ref 3.5–5.2)
Bilirubin, Direct: 0.1 mg/dL (ref 0.0–0.3)
Total Bilirubin: 0.7 mg/dL (ref 0.2–1.2)
Total Protein: 7.5 g/dL (ref 6.0–8.3)

## 2018-10-10 LAB — LIPID PANEL
Cholesterol: 195 mg/dL (ref 0–200)
HDL: 74.4 mg/dL (ref >39.00)
LDL Cholesterol: 105 mg/dL — ABNORMAL HIGH (ref 0–99)
NonHDL: 120.9
Total CHOL/HDL Ratio: 3
Triglycerides: 80 mg/dL (ref 0.0–149.0)
VLDL: 16 mg/dL (ref 0.0–40.0)

## 2018-10-10 LAB — TSH: TSH: 0.83 u[IU]/mL (ref 0.35–4.50)

## 2018-10-10 NOTE — Patient Instructions (Signed)
Your EKG was OK today  Please continue all other medications as before, and refills have been done if requested.  Please have the pharmacy call with any other refills you may need.  Please continue your efforts at being more active, low cholesterol diet, and weight control.  You are otherwise up to date with prevention measures today.  Please keep your appointments with your specialists as you may have planned  Please go to the LAB in the Basement (turn left off the elevator) for the tests to be done today  You will be contacted by phone if any changes need to be made immediately.  Otherwise, you will receive a letter about your results with an explanation, but please check with MyChart first.  Please remember to sign up for MyChart if you have not done so, as this will be important to you in the future with finding out test results, communicating by private email, and scheduling acute appointments online when needed.  Please return in 1 year for your yearly visit, or sooner if needed, with Lab testing done 3-5 days before

## 2018-10-10 NOTE — Progress Notes (Signed)
Subjective:    Patient ID: [name], male    DOB: 1929/11/01, 84 y.o.   MRN: [id number]  HPI  Here for yearly f/u;  Overall doing ok;  Pt denies Chest pain, worsening SOB, DOE, wheezing, orthopnea, PND, worsening LE edema, palpitations, dizziness or syncope.  Pt denies neurological change such as new headache, facial or extremity weakness.  Pt denies polydipsia, polyuria, or low sugar symptoms. Pt states overall good compliance with treatment and medications, good tolerability, and has been trying to follow appropriate diet.  Pt denies worsening depressive symptoms, suicidal ideation or panic. No fever, night sweats, wt loss, loss of appetite, or other constitutional symptoms.  Pt states good ability with ADL's, has low fall risk, home safety reviewed and adequate, no other significant changes in hearing or vision, and not active with exercise but walks with cane and no recent falls. . Remains on HD M-W-F for 5 yrs, recently with a concern at dialysis for ? afib by exam, advised to ask for ecg today.  States is taking the crestor 20 daily.  Midodrine helps quite a bit for BP especially on HD days.   Past Medical History:  Diagnosis Date  . ANEMIA-IRON DEFICIENCY 03/09/2008  . Arthritis    cervical spine.   . Complex renal cyst 06/19/2011  . Depression 03/26/2014  . DIVERTICULOSIS, COLON 03/09/2008  . ESRD (end stage renal disease) ([place]) 03/09/2008  . Flu 12/08/2016  . GOUT 03/09/2008  . Hemorrhoids 08/2009   internal.   . History of blood transfusion   . HYPERLIPIDEMIA 03/09/2008  . Hyperparathyroidism ([place])   . HYPERTENSION 03/09/2008  . Prostate cancer [facility]) 2002   Completed external beam radiation 2003.per HPI  . PROSTATE CANCER, HX OF 03/09/2008  . Radiation cystitis 2010.  Marland Kitchen Stroke [facility])    Past Surgical History:  Procedure Laterality Date  . AV FISTULA PLACEMENT Left 01/24/2013   Procedure: INSERTION OF ARTERIOVENOUS (AV) GORE-TEX GRAFT ARM;  Surgeon: Rosetta Posner, MD;  Location: [place];   Service: Vascular;  Laterality: Left;  . COLONOSCOPY N/A 06/17/2015   Procedure: COLONOSCOPY;  Surgeon: Gatha Mayer, MD;  Location: [place];  Service: Endoscopy;  Laterality: N/A;  . ESOPHAGOGASTRODUODENOSCOPY N/A 06/17/2015   Procedure: ESOPHAGOGASTRODUODENOSCOPY (EGD);  Surgeon: Gatha Mayer, MD;  Location: [facility] ENDOSCOPY;  Service: Endoscopy;  Laterality: N/A;  . FLEXIBLE SIGMOIDOSCOPY N/A 06/15/2015   Procedure: FLEXIBLE SIGMOIDOSCOPY;  Surgeon: Gatha Mayer, MD;  Location: [place];  Service: Endoscopy;  Laterality: N/A;  . GIVENS CAPSULE STUDY N/A 07/06/2015   Procedure: GIVENS CAPSULE STUDY;  Surgeon: Milus Banister, MD;  Location: [place];  Service: Endoscopy;  Laterality: N/A;  . JOINT REPLACEMENT     HIP  . rotator cuff repair right  4/08  . s/p left hip replacement  2007   Dr. Percell Miller ortho  . TONSILLECTOMY      reports that he quit smoking about 36 years ago. His smoking use included cigarettes. He quit after 5.00 years of use. He has never used smokeless tobacco. He reports that he does not drink alcohol or use drugs. family history includes Cancer in his brother and father; Diabetes in his father; Hypertension in his brother and father. No Known Allergies Current Outpatient Medications on File Prior to Visit  Medication Sig Dispense Refill  . allopurinol (ZYLOPRIM) 100 MG tablet Take 1 tablet (100 mg total) by mouth daily. 90 tablet 3  . aspirin EC 81 MG tablet Take 1 tablet (  81 mg total) by mouth daily.    Lorin Picket 1 GM 210 MG(Fe) tablet TAKE 2 TABLETS BY MOUTH THREE TIMES A DAY WITH MEALS.   SWALLOW WHOLE, DO NOT CHEW OR CRUSH MEDICATION  10  . citalopram (CELEXA) 10 MG tablet Take 1 tablet (10 mg total) by mouth daily. 90 tablet 3  . citalopram (CELEXA) 10 MG tablet Take by mouth.    . latanoprost (XALATAN) 0.005 % ophthalmic solution INSTILL 1 DROP BY OPHTHALMIC ROUTE EVERY DAY INTO BOTH EYE(S) IN THE EVENING  5  . lidocaine-prilocaine (EMLA) cream APPLY  SMALL AMOUNT TO ACCESS SITE (AVF) 1 HOUR BEFORE DIALYSIS. COVER WITH OCCLUSIVE DRESSING (SARAN WRAP)  11  . midodrine (PROAMATINE) 10 MG tablet Take 1 tablet (10 mg total) by mouth 3 (three) times daily. Take in the morning, lunch time and 4pm. Do not take after 4pm. 270 tablet 3  . Multiple Vitamin (MULTIVITAMIN WITH MINERALS) TABS tablet Take 1 tablet by mouth daily.    Marland Kitchen nystatin (MYCOSTATIN/NYSTOP) powder Use as directed twice per day (Patient taking differently: Apply 1 g topically 2 (two) times daily as needed (rash). ) 45 g 1  . rosuvastatin (CRESTOR) 20 MG tablet Take 1 tablet (20 mg total) by mouth at bedtime. 90 tablet 3  . rosuvastatin (CRESTOR) 20 MG tablet Take by mouth.     No current facility-administered medications on file prior to visit.    Review of Systems  Constitutional: Negative for other unusual diaphoresis or sweats HENT: Negative for ear discharge or swelling Eyes: Negative for other worsening visual disturbances Respiratory: Negative for stridor or other swelling  Gastrointestinal: Negative for worsening distension or other blood Genitourinary: Negative for retention or other urinary change Musculoskeletal: Negative for other MSK pain or swelling Skin: Negative for color change or other new lesions Neurological: Negative for worsening tremors and other numbness  Psychiatric/Behavioral: Negative for worsening agitation or other fatigue All other system neg per pt    Objective:   Physical Exam BP 102/64   Pulse (!) 56   Temp 97.7 F (36.5 C) (Oral)   Ht 5\' 9"  (1.753 m)   Wt 145 lb (65.8 kg)   SpO2 95%   BMI 21.41 kg/m  VS noted, low normal wt Constitutional: Pt appears in NAD HENT: Head: NCAT.  Right Ear: External ear normal.  Left Ear: External ear normal.  Eyes: . Pupils are equal, round, and reactive to light. Conjunctivae and EOM are normal Nose: without d/c or deformity Neck: Neck supple. Gross normal ROM Cardiovascular: Normal rate and regular  rhythm with persistent S4 gallop? That makes it seem irregular  Pulmonary/Chest: Effort normal and breath sounds without rales or wheezing.  Abd:  Soft, NT, ND, + BS, no organomegaly Neurological: Pt is alert. At baseline orientation, motor grossly intact Skin: Skin is warm. No rashes, other new lesions, no LE edema, + thrill Psychiatric: Pt behavior is normal without agitation  No other exam findings  Lab Results  Component Value Date   WBC 6.2 10/08/2017   HGB 12.0 (L) 10/08/2017   HCT 36.7 (L) 10/08/2017   PLT 229.0 10/08/2017   GLUCOSE 98 10/08/2017   CHOL 267 (H) 10/08/2017   TRIG 66.0 10/08/2017   HDL 93.40 10/08/2017   LDLCALC 160 (H) 10/08/2017   ALT 8 10/08/2017   AST 15 10/08/2017   NA 139 10/08/2017   K 4.2 10/08/2017   CL 90 (L) 10/08/2017   CREATININE 6.32 (HH) 10/08/2017   BUN 21  10/08/2017   CO2 34 (H) 10/08/2017   TSH 1.70 10/08/2017   INR 1.24 10/05/2016   HGBA1C 4.9 07/05/2015  ECG today I have personally interpreted: NSR with freq PVC     Assessment & Plan:

## 2018-10-10 NOTE — Assessment & Plan Note (Signed)
No afib, ECG with freq PVC, asympt,  to f/u any worsening symptoms or concerns

## 2018-10-10 NOTE — Assessment & Plan Note (Signed)
stable overall by history and exam, recent data reviewed with pt, and pt to continue medical treatment as before,  to f/u any worsening symptoms or concerns  

## 2018-10-10 NOTE — Assessment & Plan Note (Signed)
With high LDL last yr having gotten away from the crestor, now with good compliance and for lab today, low chol diet

## 2018-10-10 NOTE — Telephone Encounter (Signed)
Hope from down in the lab called to report critical levels for this patient: Creatine - 7.21 (high) GFR - 9.29 (low)

## 2018-10-11 DIAGNOSIS — D631 Anemia in chronic kidney disease: Secondary | ICD-10-CM | POA: Diagnosis not present

## 2018-10-11 DIAGNOSIS — N2581 Secondary hyperparathyroidism of renal origin: Secondary | ICD-10-CM | POA: Diagnosis not present

## 2018-10-11 DIAGNOSIS — Z23 Encounter for immunization: Secondary | ICD-10-CM | POA: Diagnosis not present

## 2018-10-11 DIAGNOSIS — D509 Iron deficiency anemia, unspecified: Secondary | ICD-10-CM | POA: Diagnosis not present

## 2018-10-11 DIAGNOSIS — N186 End stage renal disease: Secondary | ICD-10-CM | POA: Diagnosis not present

## 2018-10-14 DIAGNOSIS — N186 End stage renal disease: Secondary | ICD-10-CM | POA: Diagnosis not present

## 2018-10-14 DIAGNOSIS — Z23 Encounter for immunization: Secondary | ICD-10-CM | POA: Diagnosis not present

## 2018-10-14 DIAGNOSIS — D631 Anemia in chronic kidney disease: Secondary | ICD-10-CM | POA: Diagnosis not present

## 2018-10-14 DIAGNOSIS — N2581 Secondary hyperparathyroidism of renal origin: Secondary | ICD-10-CM | POA: Diagnosis not present

## 2018-10-14 DIAGNOSIS — D509 Iron deficiency anemia, unspecified: Secondary | ICD-10-CM | POA: Diagnosis not present

## 2018-10-16 DIAGNOSIS — Z23 Encounter for immunization: Secondary | ICD-10-CM | POA: Diagnosis not present

## 2018-10-16 DIAGNOSIS — N2581 Secondary hyperparathyroidism of renal origin: Secondary | ICD-10-CM | POA: Diagnosis not present

## 2018-10-16 DIAGNOSIS — D509 Iron deficiency anemia, unspecified: Secondary | ICD-10-CM | POA: Diagnosis not present

## 2018-10-16 DIAGNOSIS — N186 End stage renal disease: Secondary | ICD-10-CM | POA: Diagnosis not present

## 2018-10-16 DIAGNOSIS — D631 Anemia in chronic kidney disease: Secondary | ICD-10-CM | POA: Diagnosis not present

## 2018-10-18 DIAGNOSIS — N2581 Secondary hyperparathyroidism of renal origin: Secondary | ICD-10-CM | POA: Diagnosis not present

## 2018-10-18 DIAGNOSIS — N186 End stage renal disease: Secondary | ICD-10-CM | POA: Diagnosis not present

## 2018-10-18 DIAGNOSIS — D631 Anemia in chronic kidney disease: Secondary | ICD-10-CM | POA: Diagnosis not present

## 2018-10-18 DIAGNOSIS — Z23 Encounter for immunization: Secondary | ICD-10-CM | POA: Diagnosis not present

## 2018-10-18 DIAGNOSIS — D509 Iron deficiency anemia, unspecified: Secondary | ICD-10-CM | POA: Diagnosis not present

## 2018-10-21 DIAGNOSIS — Z23 Encounter for immunization: Secondary | ICD-10-CM | POA: Diagnosis not present

## 2018-10-21 DIAGNOSIS — D631 Anemia in chronic kidney disease: Secondary | ICD-10-CM | POA: Diagnosis not present

## 2018-10-21 DIAGNOSIS — D509 Iron deficiency anemia, unspecified: Secondary | ICD-10-CM | POA: Diagnosis not present

## 2018-10-21 DIAGNOSIS — N186 End stage renal disease: Secondary | ICD-10-CM | POA: Diagnosis not present

## 2018-10-21 DIAGNOSIS — N2581 Secondary hyperparathyroidism of renal origin: Secondary | ICD-10-CM | POA: Diagnosis not present

## 2018-10-23 DIAGNOSIS — Z23 Encounter for immunization: Secondary | ICD-10-CM | POA: Diagnosis not present

## 2018-10-23 DIAGNOSIS — D631 Anemia in chronic kidney disease: Secondary | ICD-10-CM | POA: Diagnosis not present

## 2018-10-23 DIAGNOSIS — D509 Iron deficiency anemia, unspecified: Secondary | ICD-10-CM | POA: Diagnosis not present

## 2018-10-23 DIAGNOSIS — N2581 Secondary hyperparathyroidism of renal origin: Secondary | ICD-10-CM | POA: Diagnosis not present

## 2018-10-23 DIAGNOSIS — N186 End stage renal disease: Secondary | ICD-10-CM | POA: Diagnosis not present

## 2018-10-25 DIAGNOSIS — D631 Anemia in chronic kidney disease: Secondary | ICD-10-CM | POA: Diagnosis not present

## 2018-10-25 DIAGNOSIS — Z23 Encounter for immunization: Secondary | ICD-10-CM | POA: Diagnosis not present

## 2018-10-25 DIAGNOSIS — N186 End stage renal disease: Secondary | ICD-10-CM | POA: Diagnosis not present

## 2018-10-25 DIAGNOSIS — D509 Iron deficiency anemia, unspecified: Secondary | ICD-10-CM | POA: Diagnosis not present

## 2018-10-25 DIAGNOSIS — N2581 Secondary hyperparathyroidism of renal origin: Secondary | ICD-10-CM | POA: Diagnosis not present

## 2018-10-27 DIAGNOSIS — N2581 Secondary hyperparathyroidism of renal origin: Secondary | ICD-10-CM | POA: Diagnosis not present

## 2018-10-27 DIAGNOSIS — N186 End stage renal disease: Secondary | ICD-10-CM | POA: Diagnosis not present

## 2018-10-27 DIAGNOSIS — Z23 Encounter for immunization: Secondary | ICD-10-CM | POA: Diagnosis not present

## 2018-10-27 DIAGNOSIS — D631 Anemia in chronic kidney disease: Secondary | ICD-10-CM | POA: Diagnosis not present

## 2018-10-27 DIAGNOSIS — D509 Iron deficiency anemia, unspecified: Secondary | ICD-10-CM | POA: Diagnosis not present

## 2018-10-29 DIAGNOSIS — N186 End stage renal disease: Secondary | ICD-10-CM | POA: Diagnosis not present

## 2018-10-29 DIAGNOSIS — N2581 Secondary hyperparathyroidism of renal origin: Secondary | ICD-10-CM | POA: Diagnosis not present

## 2018-10-29 DIAGNOSIS — Z23 Encounter for immunization: Secondary | ICD-10-CM | POA: Diagnosis not present

## 2018-10-29 DIAGNOSIS — D509 Iron deficiency anemia, unspecified: Secondary | ICD-10-CM | POA: Diagnosis not present

## 2018-10-29 DIAGNOSIS — D631 Anemia in chronic kidney disease: Secondary | ICD-10-CM | POA: Diagnosis not present

## 2018-11-01 DIAGNOSIS — D509 Iron deficiency anemia, unspecified: Secondary | ICD-10-CM | POA: Diagnosis not present

## 2018-11-01 DIAGNOSIS — D631 Anemia in chronic kidney disease: Secondary | ICD-10-CM | POA: Diagnosis not present

## 2018-11-01 DIAGNOSIS — Z23 Encounter for immunization: Secondary | ICD-10-CM | POA: Diagnosis not present

## 2018-11-01 DIAGNOSIS — N186 End stage renal disease: Secondary | ICD-10-CM | POA: Diagnosis not present

## 2018-11-01 DIAGNOSIS — N2581 Secondary hyperparathyroidism of renal origin: Secondary | ICD-10-CM | POA: Diagnosis not present

## 2018-11-03 DIAGNOSIS — Z23 Encounter for immunization: Secondary | ICD-10-CM | POA: Diagnosis not present

## 2018-11-03 DIAGNOSIS — D509 Iron deficiency anemia, unspecified: Secondary | ICD-10-CM | POA: Diagnosis not present

## 2018-11-03 DIAGNOSIS — N2581 Secondary hyperparathyroidism of renal origin: Secondary | ICD-10-CM | POA: Diagnosis not present

## 2018-11-03 DIAGNOSIS — N186 End stage renal disease: Secondary | ICD-10-CM | POA: Diagnosis not present

## 2018-11-03 DIAGNOSIS — D631 Anemia in chronic kidney disease: Secondary | ICD-10-CM | POA: Diagnosis not present

## 2018-11-05 DIAGNOSIS — N2581 Secondary hyperparathyroidism of renal origin: Secondary | ICD-10-CM | POA: Diagnosis not present

## 2018-11-05 DIAGNOSIS — D509 Iron deficiency anemia, unspecified: Secondary | ICD-10-CM | POA: Diagnosis not present

## 2018-11-05 DIAGNOSIS — N186 End stage renal disease: Secondary | ICD-10-CM | POA: Diagnosis not present

## 2018-11-05 DIAGNOSIS — D631 Anemia in chronic kidney disease: Secondary | ICD-10-CM | POA: Diagnosis not present

## 2018-11-05 DIAGNOSIS — Z23 Encounter for immunization: Secondary | ICD-10-CM | POA: Diagnosis not present

## 2018-11-06 DIAGNOSIS — Z992 Dependence on renal dialysis: Secondary | ICD-10-CM | POA: Diagnosis not present

## 2018-11-06 DIAGNOSIS — I158 Other secondary hypertension: Secondary | ICD-10-CM | POA: Diagnosis not present

## 2018-11-06 DIAGNOSIS — N186 End stage renal disease: Secondary | ICD-10-CM | POA: Diagnosis not present

## 2018-11-08 DIAGNOSIS — N186 End stage renal disease: Secondary | ICD-10-CM | POA: Diagnosis not present

## 2018-11-08 DIAGNOSIS — N2581 Secondary hyperparathyroidism of renal origin: Secondary | ICD-10-CM | POA: Diagnosis not present

## 2018-11-08 DIAGNOSIS — D631 Anemia in chronic kidney disease: Secondary | ICD-10-CM | POA: Diagnosis not present

## 2018-11-11 DIAGNOSIS — N186 End stage renal disease: Secondary | ICD-10-CM | POA: Diagnosis not present

## 2018-11-11 DIAGNOSIS — N2581 Secondary hyperparathyroidism of renal origin: Secondary | ICD-10-CM | POA: Diagnosis not present

## 2018-11-11 DIAGNOSIS — D631 Anemia in chronic kidney disease: Secondary | ICD-10-CM | POA: Diagnosis not present

## 2018-11-13 DIAGNOSIS — N2581 Secondary hyperparathyroidism of renal origin: Secondary | ICD-10-CM | POA: Diagnosis not present

## 2018-11-13 DIAGNOSIS — D631 Anemia in chronic kidney disease: Secondary | ICD-10-CM | POA: Diagnosis not present

## 2018-11-13 DIAGNOSIS — N186 End stage renal disease: Secondary | ICD-10-CM | POA: Diagnosis not present

## 2018-11-14 ENCOUNTER — Encounter: Payer: Self-pay | Admitting: Podiatry

## 2018-11-14 ENCOUNTER — Ambulatory Visit (INDEPENDENT_AMBULATORY_CARE_PROVIDER_SITE_OTHER): Payer: Medicare Other | Admitting: Podiatry

## 2018-11-14 DIAGNOSIS — L600 Ingrowing nail: Secondary | ICD-10-CM | POA: Diagnosis not present

## 2018-11-14 NOTE — Progress Notes (Signed)
Subjective:    Patient ID: [name], male    DOB: 1929/05/19, 85 y.o.   MRN: [id number]  HPI 85 year old male presents the office today for concerns of ingrown toenail on his left big toe which is sore and tender at times.  He denies any swelling or drainage or pus.  He states this is been ongoing for "a little while".  He states becomes irritated with pressure in shoes.  He has no other concerns today.  No recent treatment.   Review of Systems  All other systems reviewed and are negative.  Past Medical History:  Diagnosis Date  . ANEMIA-IRON DEFICIENCY 03/09/2008  . Arthritis    cervical spine.   . Complex renal cyst 06/19/2011  . Depression 03/26/2014  . DIVERTICULOSIS, COLON 03/09/2008  . ESRD (end stage renal disease) ([place]) 03/09/2008  . Flu 12/08/2016  . GOUT 03/09/2008  . Hemorrhoids 08/2009   internal.   . History of blood transfusion   . HYPERLIPIDEMIA 03/09/2008  . Hyperparathyroidism ([place])   . HYPERTENSION 03/09/2008  . Prostate cancer [facility]) 2002   Completed external beam radiation 2003.per HPI  . PROSTATE CANCER, HX OF 03/09/2008  . Radiation cystitis 2010.  Marland Kitchen Stroke [facility])     Past Surgical History:  Procedure Laterality Date  . AV FISTULA PLACEMENT Left 01/24/2013   Procedure: INSERTION OF ARTERIOVENOUS (AV) GORE-TEX GRAFT ARM;  Surgeon: Rosetta Posner, MD;  Location: [place];  Service: Vascular;  Laterality: Left;  . COLONOSCOPY N/A 06/17/2015   Procedure: COLONOSCOPY;  Surgeon: Gatha Mayer, MD;  Location: [place];  Service: Endoscopy;  Laterality: N/A;  . ESOPHAGOGASTRODUODENOSCOPY N/A 06/17/2015   Procedure: ESOPHAGOGASTRODUODENOSCOPY (EGD);  Surgeon: Gatha Mayer, MD;  Location: [facility] ENDOSCOPY;  Service: Endoscopy;  Laterality: N/A;  . FLEXIBLE SIGMOIDOSCOPY N/A 06/15/2015   Procedure: FLEXIBLE SIGMOIDOSCOPY;  Surgeon: Gatha Mayer, MD;  Location: [place];  Service: Endoscopy;  Laterality: N/A;  . GIVENS CAPSULE STUDY N/A 07/06/2015   Procedure: GIVENS  CAPSULE STUDY;  Surgeon: Milus Banister, MD;  Location: [place];  Service: Endoscopy;  Laterality: N/A;  . JOINT REPLACEMENT     HIP  . rotator cuff repair right  4/08  . s/p left hip replacement  2007   Dr. Percell Miller ortho  . TONSILLECTOMY       Current Outpatient Medications:  .  allopurinol (ZYLOPRIM) 100 MG tablet, Take 1 tablet (100 mg total) by mouth daily., Disp: 90 tablet, Rfl: 3 .  aspirin EC 81 MG tablet, Take 1 tablet (81 mg total) by mouth daily., Disp: , Rfl:  .  AURYXIA 1 GM 210 MG(Fe) tablet, TAKE 2 TABLETS BY MOUTH THREE TIMES A DAY WITH MEALS.   SWALLOW WHOLE, DO NOT CHEW OR CRUSH MEDICATION, Disp: , Rfl: 10 .  citalopram (CELEXA) 10 MG tablet, Take 1 tablet (10 mg total) by mouth daily., Disp: 90 tablet, Rfl: 3 .  citalopram (CELEXA) 10 MG tablet, Take by mouth., Disp: , Rfl:  .  latanoprost (XALATAN) 0.005 % ophthalmic solution, INSTILL 1 DROP BY OPHTHALMIC ROUTE EVERY DAY INTO BOTH EYE(S) IN THE EVENING, Disp: , Rfl: 5 .  lidocaine-prilocaine (EMLA) cream, APPLY SMALL AMOUNT TO ACCESS SITE (AVF) 1 HOUR BEFORE DIALYSIS. COVER WITH OCCLUSIVE DRESSING (SARAN WRAP), Disp: , Rfl: 11 .  midodrine (PROAMATINE) 10 MG tablet, Take 1 tablet (10 mg total) by mouth 3 (three) times daily. Take in the morning, lunch time and 4pm. Do not take after 4pm., Disp:  270 tablet, Rfl: 3 .  Multiple Vitamin (MULTIVITAMIN WITH MINERALS) TABS tablet, Take 1 tablet by mouth daily., Disp: , Rfl:  .  nystatin (MYCOSTATIN/NYSTOP) powder, Use as directed twice per day (Patient taking differently: Apply 1 g topically 2 (two) times daily as needed (rash). ), Disp: 45 g, Rfl: 1 .  rosuvastatin (CRESTOR) 20 MG tablet, Take 1 tablet (20 mg total) by mouth at bedtime., Disp: 90 tablet, Rfl: 3 .  rosuvastatin (CRESTOR) 20 MG tablet, Take by mouth., Disp: , Rfl:   No Known Allergies      Objective:   Physical Exam General: AAO x3, NAD  Dermatological: The left hallux toenail is hypertrophic,  dystrophic and there is incurvation present on the nail corners.  There is no edema, erythema, drainage or pus there is no clinical signs of infection.  There is no areas of skin breakdown identified at this time.  Vascular: Dorsalis Pedis artery and Posterior Tibial artery pedal pulses are 2/4 bilateral with immedate capillary fill time. There is no pain with calf compression, swelling, warmth, erythema.   Neruologic: Grossly intact via light touch bilateral. Protective threshold with Semmes Wienstein monofilament intact to all pedal sites bilateral.   Musculoskeletal: No gross boney pedal deformities bilateral. No pain, crepitus, or limitation noted with foot and ankle range of motion bilateral. Muscular strength 5/5 in all groups tested bilateral.     Assessment & Plan:  85 year old male left hallux ingrown toenail -Treatment options discussed including all alternatives, risks, and complications -Etiology of symptoms were discussed -We discussed treatment options both conservative as well as surgical.  Today I sharply debrided the toenail with any complications or bleeding to remove the symptomatic portion of ingrown toenail.  Continue to monitor any signs or symptoms of infection.  Recommend antibiotic ointment dressing changes daily.  If not resolved in the next couple of weeks and let me know or sooner if there is any issues.  He agrees with this plan.  Trula Slade DPM

## 2018-11-15 DIAGNOSIS — N186 End stage renal disease: Secondary | ICD-10-CM | POA: Diagnosis not present

## 2018-11-15 DIAGNOSIS — N2581 Secondary hyperparathyroidism of renal origin: Secondary | ICD-10-CM | POA: Diagnosis not present

## 2018-11-15 DIAGNOSIS — D631 Anemia in chronic kidney disease: Secondary | ICD-10-CM | POA: Diagnosis not present

## 2018-11-18 DIAGNOSIS — N2581 Secondary hyperparathyroidism of renal origin: Secondary | ICD-10-CM | POA: Diagnosis not present

## 2018-11-18 DIAGNOSIS — N186 End stage renal disease: Secondary | ICD-10-CM | POA: Diagnosis not present

## 2018-11-18 DIAGNOSIS — D631 Anemia in chronic kidney disease: Secondary | ICD-10-CM | POA: Diagnosis not present

## 2018-11-19 ENCOUNTER — Ambulatory Visit: Payer: Medicare Other | Admitting: Cardiology

## 2018-11-20 DIAGNOSIS — N2581 Secondary hyperparathyroidism of renal origin: Secondary | ICD-10-CM | POA: Diagnosis not present

## 2018-11-20 DIAGNOSIS — N186 End stage renal disease: Secondary | ICD-10-CM | POA: Diagnosis not present

## 2018-11-20 DIAGNOSIS — D631 Anemia in chronic kidney disease: Secondary | ICD-10-CM | POA: Diagnosis not present

## 2018-11-22 DIAGNOSIS — N186 End stage renal disease: Secondary | ICD-10-CM | POA: Diagnosis not present

## 2018-11-22 DIAGNOSIS — N2581 Secondary hyperparathyroidism of renal origin: Secondary | ICD-10-CM | POA: Diagnosis not present

## 2018-11-22 DIAGNOSIS — D631 Anemia in chronic kidney disease: Secondary | ICD-10-CM | POA: Diagnosis not present

## 2018-11-25 DIAGNOSIS — N2581 Secondary hyperparathyroidism of renal origin: Secondary | ICD-10-CM | POA: Diagnosis not present

## 2018-11-25 DIAGNOSIS — D631 Anemia in chronic kidney disease: Secondary | ICD-10-CM | POA: Diagnosis not present

## 2018-11-25 DIAGNOSIS — N186 End stage renal disease: Secondary | ICD-10-CM | POA: Diagnosis not present

## 2018-11-26 DIAGNOSIS — C61 Malignant neoplasm of prostate: Secondary | ICD-10-CM | POA: Diagnosis not present

## 2018-11-27 DIAGNOSIS — N186 End stage renal disease: Secondary | ICD-10-CM | POA: Diagnosis not present

## 2018-11-27 DIAGNOSIS — D631 Anemia in chronic kidney disease: Secondary | ICD-10-CM | POA: Diagnosis not present

## 2018-11-27 DIAGNOSIS — N2581 Secondary hyperparathyroidism of renal origin: Secondary | ICD-10-CM | POA: Diagnosis not present

## 2018-11-29 DIAGNOSIS — N186 End stage renal disease: Secondary | ICD-10-CM | POA: Diagnosis not present

## 2018-11-29 DIAGNOSIS — N2581 Secondary hyperparathyroidism of renal origin: Secondary | ICD-10-CM | POA: Diagnosis not present

## 2018-11-29 DIAGNOSIS — D631 Anemia in chronic kidney disease: Secondary | ICD-10-CM | POA: Diagnosis not present

## 2018-12-02 DIAGNOSIS — N2581 Secondary hyperparathyroidism of renal origin: Secondary | ICD-10-CM | POA: Diagnosis not present

## 2018-12-02 DIAGNOSIS — D631 Anemia in chronic kidney disease: Secondary | ICD-10-CM | POA: Diagnosis not present

## 2018-12-02 DIAGNOSIS — N186 End stage renal disease: Secondary | ICD-10-CM | POA: Diagnosis not present

## 2018-12-04 DIAGNOSIS — N2581 Secondary hyperparathyroidism of renal origin: Secondary | ICD-10-CM | POA: Diagnosis not present

## 2018-12-04 DIAGNOSIS — D631 Anemia in chronic kidney disease: Secondary | ICD-10-CM | POA: Diagnosis not present

## 2018-12-04 DIAGNOSIS — N186 End stage renal disease: Secondary | ICD-10-CM | POA: Diagnosis not present

## 2018-12-06 DIAGNOSIS — N186 End stage renal disease: Secondary | ICD-10-CM | POA: Diagnosis not present

## 2018-12-06 DIAGNOSIS — D631 Anemia in chronic kidney disease: Secondary | ICD-10-CM | POA: Diagnosis not present

## 2018-12-06 DIAGNOSIS — N2581 Secondary hyperparathyroidism of renal origin: Secondary | ICD-10-CM | POA: Diagnosis not present

## 2018-12-07 DIAGNOSIS — I158 Other secondary hypertension: Secondary | ICD-10-CM | POA: Diagnosis not present

## 2018-12-07 DIAGNOSIS — Z992 Dependence on renal dialysis: Secondary | ICD-10-CM | POA: Diagnosis not present

## 2018-12-07 DIAGNOSIS — N186 End stage renal disease: Secondary | ICD-10-CM | POA: Diagnosis not present

## 2018-12-09 DIAGNOSIS — N186 End stage renal disease: Secondary | ICD-10-CM | POA: Diagnosis not present

## 2018-12-09 DIAGNOSIS — N2581 Secondary hyperparathyroidism of renal origin: Secondary | ICD-10-CM | POA: Diagnosis not present

## 2018-12-11 DIAGNOSIS — N186 End stage renal disease: Secondary | ICD-10-CM | POA: Diagnosis not present

## 2018-12-11 DIAGNOSIS — N2581 Secondary hyperparathyroidism of renal origin: Secondary | ICD-10-CM | POA: Diagnosis not present

## 2018-12-12 DIAGNOSIS — R9721 Rising PSA following treatment for malignant neoplasm of prostate: Secondary | ICD-10-CM | POA: Diagnosis not present

## 2018-12-12 DIAGNOSIS — Z992 Dependence on renal dialysis: Secondary | ICD-10-CM | POA: Diagnosis not present

## 2018-12-12 DIAGNOSIS — I871 Compression of vein: Secondary | ICD-10-CM | POA: Diagnosis not present

## 2018-12-12 DIAGNOSIS — N186 End stage renal disease: Secondary | ICD-10-CM | POA: Diagnosis not present

## 2018-12-12 DIAGNOSIS — E291 Testicular hypofunction: Secondary | ICD-10-CM | POA: Diagnosis not present

## 2018-12-12 DIAGNOSIS — T82858A Stenosis of vascular prosthetic devices, implants and grafts, initial encounter: Secondary | ICD-10-CM | POA: Diagnosis not present

## 2018-12-12 DIAGNOSIS — C61 Malignant neoplasm of prostate: Secondary | ICD-10-CM | POA: Diagnosis not present

## 2018-12-13 DIAGNOSIS — N186 End stage renal disease: Secondary | ICD-10-CM | POA: Diagnosis not present

## 2018-12-13 DIAGNOSIS — N2581 Secondary hyperparathyroidism of renal origin: Secondary | ICD-10-CM | POA: Diagnosis not present

## 2018-12-16 DIAGNOSIS — N186 End stage renal disease: Secondary | ICD-10-CM | POA: Diagnosis not present

## 2018-12-16 DIAGNOSIS — N2581 Secondary hyperparathyroidism of renal origin: Secondary | ICD-10-CM | POA: Diagnosis not present

## 2018-12-18 DIAGNOSIS — N2581 Secondary hyperparathyroidism of renal origin: Secondary | ICD-10-CM | POA: Diagnosis not present

## 2018-12-18 DIAGNOSIS — N186 End stage renal disease: Secondary | ICD-10-CM | POA: Diagnosis not present

## 2018-12-20 DIAGNOSIS — N186 End stage renal disease: Secondary | ICD-10-CM | POA: Diagnosis not present

## 2018-12-20 DIAGNOSIS — N2581 Secondary hyperparathyroidism of renal origin: Secondary | ICD-10-CM | POA: Diagnosis not present

## 2018-12-23 DIAGNOSIS — N186 End stage renal disease: Secondary | ICD-10-CM | POA: Diagnosis not present

## 2018-12-23 DIAGNOSIS — N2581 Secondary hyperparathyroidism of renal origin: Secondary | ICD-10-CM | POA: Diagnosis not present

## 2018-12-25 DIAGNOSIS — N2581 Secondary hyperparathyroidism of renal origin: Secondary | ICD-10-CM | POA: Diagnosis not present

## 2018-12-25 DIAGNOSIS — N186 End stage renal disease: Secondary | ICD-10-CM | POA: Diagnosis not present

## 2018-12-30 ENCOUNTER — Other Ambulatory Visit: Payer: Self-pay | Admitting: Internal Medicine

## 2018-12-30 DIAGNOSIS — N2581 Secondary hyperparathyroidism of renal origin: Secondary | ICD-10-CM | POA: Diagnosis not present

## 2018-12-30 DIAGNOSIS — N186 End stage renal disease: Secondary | ICD-10-CM | POA: Diagnosis not present

## 2018-12-30 MED ORDER — ALLOPURINOL 100 MG PO TABS: 100.0000 mg | ORAL_TABLET | Freq: Every day | ORAL | 3 refills | Status: DC

## 2018-12-30 NOTE — Telephone Encounter (Signed)
Copied from [place] [id number]. Topic: Quick Communication - Rx Refill/Question >> Dec 30, 2018  8:43 AM Scherrie Gerlach wrote: Medication: allopurinol (ZYLOPRIM) 100 MG tablet Wife states the pharmacy advised they had sent several times, but I do not see we received. 90 day [id number] [place], [place] [phone] (Phone) [phone] (Fax)

## 2018-12-30 NOTE — Telephone Encounter (Signed)
Requested medication (s) are due for refill today: yes  Requested medication (s) are on the active medication list: yes  Last refill:  10/08/17  Future visit scheduled: yes  Notes to clinic:  Gout agents failed. This prescription expired on 10/08/2018.  Requested Prescriptions  Pending Prescriptions Disp Refills   allopurinol (ZYLOPRIM) 100 MG tablet 90 tablet 3    Sig: Take 1 tablet (100 mg total) by mouth daily.     Endocrinology:  Gout Agents Failed - 12/30/2018 11:36 AM      Failed - Uric Acid in normal range and within 360 days    Uric Acid, Serum  Date Value Ref Range Status  06/19/2011 5.7 4.0 - 7.8 mg/dL Final         Failed - Cr in normal range and within 360 days    Creatinine, Ser  Date Value Ref Range Status  10/10/2018 7.21 (HH) 0.40 - 1.50 mg/dL Final         Passed - Valid encounter within last 12 months    Recent Outpatient Visits          2 months ago Irregular heart rhythm   [facility] -Georges Mouse, MD   11 months ago Left-sided low back pain without sciatica, unspecified chronicity   [place], Chip W, MD   1 year ago Iron deficiency anemia, unspecified iron deficiency anemia type   [facility] -Georges Mouse, MD   1 year ago Hyperlipidemia, unspecified hyperlipidemia type   [place], Zidan W, MD   2 years ago Influenza [place], [place], MD      Future Appointments            In 9 months Jenny Reichmann, Hunt Oris, MD [place], [facility]

## 2019-01-01 DIAGNOSIS — N186 End stage renal disease: Secondary | ICD-10-CM | POA: Diagnosis not present

## 2019-01-01 DIAGNOSIS — N2581 Secondary hyperparathyroidism of renal origin: Secondary | ICD-10-CM | POA: Diagnosis not present

## 2019-01-03 DIAGNOSIS — N2581 Secondary hyperparathyroidism of renal origin: Secondary | ICD-10-CM | POA: Diagnosis not present

## 2019-01-03 DIAGNOSIS — N186 End stage renal disease: Secondary | ICD-10-CM | POA: Diagnosis not present

## 2019-01-05 DIAGNOSIS — N186 End stage renal disease: Secondary | ICD-10-CM | POA: Diagnosis not present

## 2019-01-05 DIAGNOSIS — I158 Other secondary hypertension: Secondary | ICD-10-CM | POA: Diagnosis not present

## 2019-01-05 DIAGNOSIS — Z992 Dependence on renal dialysis: Secondary | ICD-10-CM | POA: Diagnosis not present

## 2019-01-06 DIAGNOSIS — D631 Anemia in chronic kidney disease: Secondary | ICD-10-CM | POA: Diagnosis not present

## 2019-01-06 DIAGNOSIS — N2581 Secondary hyperparathyroidism of renal origin: Secondary | ICD-10-CM | POA: Diagnosis not present

## 2019-01-06 DIAGNOSIS — N186 End stage renal disease: Secondary | ICD-10-CM | POA: Diagnosis not present

## 2019-01-08 DIAGNOSIS — D631 Anemia in chronic kidney disease: Secondary | ICD-10-CM | POA: Diagnosis not present

## 2019-01-08 DIAGNOSIS — N2581 Secondary hyperparathyroidism of renal origin: Secondary | ICD-10-CM | POA: Diagnosis not present

## 2019-01-08 DIAGNOSIS — N186 End stage renal disease: Secondary | ICD-10-CM | POA: Diagnosis not present

## 2019-01-10 DIAGNOSIS — N2581 Secondary hyperparathyroidism of renal origin: Secondary | ICD-10-CM | POA: Diagnosis not present

## 2019-01-10 DIAGNOSIS — D631 Anemia in chronic kidney disease: Secondary | ICD-10-CM | POA: Diagnosis not present

## 2019-01-10 DIAGNOSIS — N186 End stage renal disease: Secondary | ICD-10-CM | POA: Diagnosis not present

## 2019-01-13 DIAGNOSIS — N2581 Secondary hyperparathyroidism of renal origin: Secondary | ICD-10-CM | POA: Diagnosis not present

## 2019-01-13 DIAGNOSIS — D631 Anemia in chronic kidney disease: Secondary | ICD-10-CM | POA: Diagnosis not present

## 2019-01-13 DIAGNOSIS — N186 End stage renal disease: Secondary | ICD-10-CM | POA: Diagnosis not present

## 2019-01-15 ENCOUNTER — Other Ambulatory Visit: Payer: Self-pay | Admitting: Internal Medicine

## 2019-01-15 DIAGNOSIS — D631 Anemia in chronic kidney disease: Secondary | ICD-10-CM | POA: Diagnosis not present

## 2019-01-15 DIAGNOSIS — N186 End stage renal disease: Secondary | ICD-10-CM | POA: Diagnosis not present

## 2019-01-15 DIAGNOSIS — N2581 Secondary hyperparathyroidism of renal origin: Secondary | ICD-10-CM | POA: Diagnosis not present

## 2019-01-15 MED ORDER — ROSUVASTATIN CALCIUM 20 MG PO TABS: 20.0000 mg | ORAL_TABLET | Freq: Every day | ORAL | 3 refills | Status: DC

## 2019-01-15 NOTE — Telephone Encounter (Signed)
Provider aware of lab results- Rx refill per protocol Requested Prescriptions  Pending Prescriptions Disp Refills  . rosuvastatin (CRESTOR) 20 MG tablet 90 tablet 3    Sig: Take 1 tablet (20 mg total) by mouth at bedtime.     Cardiovascular:  Antilipid - Statins Failed - 01/15/2019  9:01 AM      Failed - LDL in normal range and within 360 days    LDL Cholesterol  Date Value Ref Range Status  10/10/2018 105 (H) 0 - 99 mg/dL Final         Passed - Total Cholesterol in normal range and within 360 days    Cholesterol  Date Value Ref Range Status  10/10/2018 195 0 - 200 mg/dL Final    Comment:    ATP III Classification       Desirable:  < 200 mg/dL               Borderline High:  200 - 239 mg/dL          High:  > = 240 mg/dL         Passed - HDL in normal range and within 360 days    HDL  Date Value Ref Range Status  10/10/2018 74.40 >39.00 mg/dL Final         Passed - Triglycerides in normal range and within 360 days    Triglycerides  Date Value Ref Range Status  10/10/2018 80.0 0.0 - 149.0 mg/dL Final    Comment:    Normal:  <150 mg/dLBorderline High:  150 - 199 mg/dL         Passed - Patient is not pregnant      Passed - Valid encounter within last 12 months    Recent Outpatient Visits          3 months ago Irregular heart rhythm   [facility] -Georges Mouse, MD   1 year ago Left-sided low back pain without sciatica, unspecified chronicity   [place], Burney W, MD   1 year ago Iron deficiency anemia, unspecified iron deficiency anemia type   [facility] -Georges Mouse, MD   1 year ago Hyperlipidemia, unspecified hyperlipidemia type   [place], Daishawn W, MD   2 years ago Influenza [place], [place], MD      Future Appointments            In 9 months Jenny Reichmann, Hunt Oris, MD [place],  [facility]

## 2019-01-15 NOTE — Telephone Encounter (Signed)
Copied from [place] [id number]. Topic: Quick Communication - Rx Refill/Question >> Jan 15, 2019  8:33 AM Margot Ables wrote: Medication: rosuvastatin (CRESTOR) 20 MG tablet - pt is out - pt is needing refill and the pharmacy advised they contacted MD office last Thursday and have not received a reply - advised pt to call office - I do not see an electronic request  Has the patient contacted their pharmacy? Yes.   Preferred Pharmacy (with phone number or street name): [id number] [place], [place] [phone] (Phone) [phone] (Fax)

## 2019-01-17 DIAGNOSIS — N186 End stage renal disease: Secondary | ICD-10-CM | POA: Diagnosis not present

## 2019-01-17 DIAGNOSIS — D631 Anemia in chronic kidney disease: Secondary | ICD-10-CM | POA: Diagnosis not present

## 2019-01-17 DIAGNOSIS — N2581 Secondary hyperparathyroidism of renal origin: Secondary | ICD-10-CM | POA: Diagnosis not present

## 2019-01-20 DIAGNOSIS — N2581 Secondary hyperparathyroidism of renal origin: Secondary | ICD-10-CM | POA: Diagnosis not present

## 2019-01-20 DIAGNOSIS — D631 Anemia in chronic kidney disease: Secondary | ICD-10-CM | POA: Diagnosis not present

## 2019-01-20 DIAGNOSIS — N186 End stage renal disease: Secondary | ICD-10-CM | POA: Diagnosis not present

## 2019-01-22 DIAGNOSIS — N186 End stage renal disease: Secondary | ICD-10-CM | POA: Diagnosis not present

## 2019-01-22 DIAGNOSIS — N2581 Secondary hyperparathyroidism of renal origin: Secondary | ICD-10-CM | POA: Diagnosis not present

## 2019-01-22 DIAGNOSIS — D631 Anemia in chronic kidney disease: Secondary | ICD-10-CM | POA: Diagnosis not present

## 2019-01-24 DIAGNOSIS — N186 End stage renal disease: Secondary | ICD-10-CM | POA: Diagnosis not present

## 2019-01-24 DIAGNOSIS — N2581 Secondary hyperparathyroidism of renal origin: Secondary | ICD-10-CM | POA: Diagnosis not present

## 2019-01-24 DIAGNOSIS — D631 Anemia in chronic kidney disease: Secondary | ICD-10-CM | POA: Diagnosis not present

## 2019-01-27 ENCOUNTER — Telehealth: Payer: Self-pay | Admitting: Internal Medicine

## 2019-01-27 DIAGNOSIS — N2581 Secondary hyperparathyroidism of renal origin: Secondary | ICD-10-CM | POA: Diagnosis not present

## 2019-01-27 DIAGNOSIS — N186 End stage renal disease: Secondary | ICD-10-CM | POA: Diagnosis not present

## 2019-01-27 DIAGNOSIS — D631 Anemia in chronic kidney disease: Secondary | ICD-10-CM | POA: Diagnosis not present

## 2019-01-27 MED ORDER — CITALOPRAM HYDROBROMIDE 10 MG PO TABS: 10.0000 mg | ORAL_TABLET | Freq: Every day | ORAL | 1 refills | Status: DC

## 2019-01-27 NOTE — Telephone Encounter (Signed)
Copied from [place] [id number]. Topic: Quick Communication - Rx Refill/Question >> Jan 27, 2019 11:30 AM Richardo Priest, NT wrote: Medication: citalopram (CELEXA) 10 MG tablet, taken daily  Has the patient contacted their pharmacy? Yes, pharmacy advised a patient needed an appointment for this medication refill.   Preferred Pharmacy (with phone number or street name):  [id number] [place], [place] [phone] (Phone) [phone] (Fax)  Agent: Please be advised that RX refills may take up to 3 business days. We ask that you follow-up with your pharmacy.

## 2019-01-29 DIAGNOSIS — N2581 Secondary hyperparathyroidism of renal origin: Secondary | ICD-10-CM | POA: Diagnosis not present

## 2019-01-29 DIAGNOSIS — D631 Anemia in chronic kidney disease: Secondary | ICD-10-CM | POA: Diagnosis not present

## 2019-01-29 DIAGNOSIS — N186 End stage renal disease: Secondary | ICD-10-CM | POA: Diagnosis not present

## 2019-01-31 DIAGNOSIS — D631 Anemia in chronic kidney disease: Secondary | ICD-10-CM | POA: Diagnosis not present

## 2019-01-31 DIAGNOSIS — N186 End stage renal disease: Secondary | ICD-10-CM | POA: Diagnosis not present

## 2019-01-31 DIAGNOSIS — N2581 Secondary hyperparathyroidism of renal origin: Secondary | ICD-10-CM | POA: Diagnosis not present

## 2019-02-03 DIAGNOSIS — D631 Anemia in chronic kidney disease: Secondary | ICD-10-CM | POA: Diagnosis not present

## 2019-02-03 DIAGNOSIS — N186 End stage renal disease: Secondary | ICD-10-CM | POA: Diagnosis not present

## 2019-02-03 DIAGNOSIS — N2581 Secondary hyperparathyroidism of renal origin: Secondary | ICD-10-CM | POA: Diagnosis not present

## 2019-02-05 DIAGNOSIS — I158 Other secondary hypertension: Secondary | ICD-10-CM | POA: Diagnosis not present

## 2019-02-05 DIAGNOSIS — D631 Anemia in chronic kidney disease: Secondary | ICD-10-CM | POA: Diagnosis not present

## 2019-02-05 DIAGNOSIS — D509 Iron deficiency anemia, unspecified: Secondary | ICD-10-CM | POA: Diagnosis not present

## 2019-02-05 DIAGNOSIS — Z992 Dependence on renal dialysis: Secondary | ICD-10-CM | POA: Diagnosis not present

## 2019-02-05 DIAGNOSIS — N2581 Secondary hyperparathyroidism of renal origin: Secondary | ICD-10-CM | POA: Diagnosis not present

## 2019-02-05 DIAGNOSIS — N186 End stage renal disease: Secondary | ICD-10-CM | POA: Diagnosis not present

## 2019-02-05 DIAGNOSIS — Z23 Encounter for immunization: Secondary | ICD-10-CM | POA: Diagnosis not present

## 2019-02-07 DIAGNOSIS — D631 Anemia in chronic kidney disease: Secondary | ICD-10-CM | POA: Diagnosis not present

## 2019-02-07 DIAGNOSIS — Z23 Encounter for immunization: Secondary | ICD-10-CM | POA: Diagnosis not present

## 2019-02-07 DIAGNOSIS — N186 End stage renal disease: Secondary | ICD-10-CM | POA: Diagnosis not present

## 2019-02-07 DIAGNOSIS — N2581 Secondary hyperparathyroidism of renal origin: Secondary | ICD-10-CM | POA: Diagnosis not present

## 2019-02-07 DIAGNOSIS — D509 Iron deficiency anemia, unspecified: Secondary | ICD-10-CM | POA: Diagnosis not present

## 2019-02-10 DIAGNOSIS — N186 End stage renal disease: Secondary | ICD-10-CM | POA: Diagnosis not present

## 2019-02-10 DIAGNOSIS — D631 Anemia in chronic kidney disease: Secondary | ICD-10-CM | POA: Diagnosis not present

## 2019-02-10 DIAGNOSIS — Z23 Encounter for immunization: Secondary | ICD-10-CM | POA: Diagnosis not present

## 2019-02-10 DIAGNOSIS — N2581 Secondary hyperparathyroidism of renal origin: Secondary | ICD-10-CM | POA: Diagnosis not present

## 2019-02-10 DIAGNOSIS — D509 Iron deficiency anemia, unspecified: Secondary | ICD-10-CM | POA: Diagnosis not present

## 2019-02-12 DIAGNOSIS — Z23 Encounter for immunization: Secondary | ICD-10-CM | POA: Diagnosis not present

## 2019-02-12 DIAGNOSIS — N2581 Secondary hyperparathyroidism of renal origin: Secondary | ICD-10-CM | POA: Diagnosis not present

## 2019-02-12 DIAGNOSIS — D631 Anemia in chronic kidney disease: Secondary | ICD-10-CM | POA: Diagnosis not present

## 2019-02-12 DIAGNOSIS — N186 End stage renal disease: Secondary | ICD-10-CM | POA: Diagnosis not present

## 2019-02-12 DIAGNOSIS — D509 Iron deficiency anemia, unspecified: Secondary | ICD-10-CM | POA: Diagnosis not present

## 2019-02-14 DIAGNOSIS — D509 Iron deficiency anemia, unspecified: Secondary | ICD-10-CM | POA: Diagnosis not present

## 2019-02-14 DIAGNOSIS — N186 End stage renal disease: Secondary | ICD-10-CM | POA: Diagnosis not present

## 2019-02-14 DIAGNOSIS — D631 Anemia in chronic kidney disease: Secondary | ICD-10-CM | POA: Diagnosis not present

## 2019-02-14 DIAGNOSIS — N2581 Secondary hyperparathyroidism of renal origin: Secondary | ICD-10-CM | POA: Diagnosis not present

## 2019-02-14 DIAGNOSIS — Z23 Encounter for immunization: Secondary | ICD-10-CM | POA: Diagnosis not present

## 2019-02-17 DIAGNOSIS — N2581 Secondary hyperparathyroidism of renal origin: Secondary | ICD-10-CM | POA: Diagnosis not present

## 2019-02-17 DIAGNOSIS — D509 Iron deficiency anemia, unspecified: Secondary | ICD-10-CM | POA: Diagnosis not present

## 2019-02-17 DIAGNOSIS — N186 End stage renal disease: Secondary | ICD-10-CM | POA: Diagnosis not present

## 2019-02-17 DIAGNOSIS — D631 Anemia in chronic kidney disease: Secondary | ICD-10-CM | POA: Diagnosis not present

## 2019-02-17 DIAGNOSIS — Z23 Encounter for immunization: Secondary | ICD-10-CM | POA: Diagnosis not present

## 2019-02-19 DIAGNOSIS — N2581 Secondary hyperparathyroidism of renal origin: Secondary | ICD-10-CM | POA: Diagnosis not present

## 2019-02-19 DIAGNOSIS — D509 Iron deficiency anemia, unspecified: Secondary | ICD-10-CM | POA: Diagnosis not present

## 2019-02-19 DIAGNOSIS — D631 Anemia in chronic kidney disease: Secondary | ICD-10-CM | POA: Diagnosis not present

## 2019-02-19 DIAGNOSIS — N186 End stage renal disease: Secondary | ICD-10-CM | POA: Diagnosis not present

## 2019-02-19 DIAGNOSIS — Z23 Encounter for immunization: Secondary | ICD-10-CM | POA: Diagnosis not present

## 2019-02-20 ENCOUNTER — Telehealth: Payer: Self-pay | Admitting: Internal Medicine

## 2019-02-20 NOTE — Telephone Encounter (Signed)
Patient declined AWV. SF °

## 2019-02-21 DIAGNOSIS — N186 End stage renal disease: Secondary | ICD-10-CM | POA: Diagnosis not present

## 2019-02-21 DIAGNOSIS — D631 Anemia in chronic kidney disease: Secondary | ICD-10-CM | POA: Diagnosis not present

## 2019-02-21 DIAGNOSIS — D509 Iron deficiency anemia, unspecified: Secondary | ICD-10-CM | POA: Diagnosis not present

## 2019-02-21 DIAGNOSIS — Z23 Encounter for immunization: Secondary | ICD-10-CM | POA: Diagnosis not present

## 2019-02-21 DIAGNOSIS — N2581 Secondary hyperparathyroidism of renal origin: Secondary | ICD-10-CM | POA: Diagnosis not present

## 2019-02-24 DIAGNOSIS — D631 Anemia in chronic kidney disease: Secondary | ICD-10-CM | POA: Diagnosis not present

## 2019-02-24 DIAGNOSIS — N186 End stage renal disease: Secondary | ICD-10-CM | POA: Diagnosis not present

## 2019-02-24 DIAGNOSIS — N2581 Secondary hyperparathyroidism of renal origin: Secondary | ICD-10-CM | POA: Diagnosis not present

## 2019-02-24 DIAGNOSIS — D509 Iron deficiency anemia, unspecified: Secondary | ICD-10-CM | POA: Diagnosis not present

## 2019-02-24 DIAGNOSIS — Z23 Encounter for immunization: Secondary | ICD-10-CM | POA: Diagnosis not present

## 2019-02-26 DIAGNOSIS — Z23 Encounter for immunization: Secondary | ICD-10-CM | POA: Diagnosis not present

## 2019-02-26 DIAGNOSIS — D509 Iron deficiency anemia, unspecified: Secondary | ICD-10-CM | POA: Diagnosis not present

## 2019-02-26 DIAGNOSIS — N186 End stage renal disease: Secondary | ICD-10-CM | POA: Diagnosis not present

## 2019-02-26 DIAGNOSIS — N2581 Secondary hyperparathyroidism of renal origin: Secondary | ICD-10-CM | POA: Diagnosis not present

## 2019-02-26 DIAGNOSIS — D631 Anemia in chronic kidney disease: Secondary | ICD-10-CM | POA: Diagnosis not present

## 2019-02-28 DIAGNOSIS — Z23 Encounter for immunization: Secondary | ICD-10-CM | POA: Diagnosis not present

## 2019-02-28 DIAGNOSIS — N2581 Secondary hyperparathyroidism of renal origin: Secondary | ICD-10-CM | POA: Diagnosis not present

## 2019-02-28 DIAGNOSIS — D509 Iron deficiency anemia, unspecified: Secondary | ICD-10-CM | POA: Diagnosis not present

## 2019-02-28 DIAGNOSIS — N186 End stage renal disease: Secondary | ICD-10-CM | POA: Diagnosis not present

## 2019-02-28 DIAGNOSIS — D631 Anemia in chronic kidney disease: Secondary | ICD-10-CM | POA: Diagnosis not present

## 2019-03-03 DIAGNOSIS — Z23 Encounter for immunization: Secondary | ICD-10-CM | POA: Diagnosis not present

## 2019-03-03 DIAGNOSIS — N186 End stage renal disease: Secondary | ICD-10-CM | POA: Diagnosis not present

## 2019-03-03 DIAGNOSIS — D509 Iron deficiency anemia, unspecified: Secondary | ICD-10-CM | POA: Diagnosis not present

## 2019-03-03 DIAGNOSIS — N2581 Secondary hyperparathyroidism of renal origin: Secondary | ICD-10-CM | POA: Diagnosis not present

## 2019-03-03 DIAGNOSIS — D631 Anemia in chronic kidney disease: Secondary | ICD-10-CM | POA: Diagnosis not present

## 2019-03-05 DIAGNOSIS — D631 Anemia in chronic kidney disease: Secondary | ICD-10-CM | POA: Diagnosis not present

## 2019-03-05 DIAGNOSIS — D509 Iron deficiency anemia, unspecified: Secondary | ICD-10-CM | POA: Diagnosis not present

## 2019-03-05 DIAGNOSIS — N186 End stage renal disease: Secondary | ICD-10-CM | POA: Diagnosis not present

## 2019-03-05 DIAGNOSIS — N2581 Secondary hyperparathyroidism of renal origin: Secondary | ICD-10-CM | POA: Diagnosis not present

## 2019-03-05 DIAGNOSIS — Z23 Encounter for immunization: Secondary | ICD-10-CM | POA: Diagnosis not present

## 2019-03-07 DIAGNOSIS — I158 Other secondary hypertension: Secondary | ICD-10-CM | POA: Diagnosis not present

## 2019-03-07 DIAGNOSIS — N2581 Secondary hyperparathyroidism of renal origin: Secondary | ICD-10-CM | POA: Diagnosis not present

## 2019-03-07 DIAGNOSIS — Z992 Dependence on renal dialysis: Secondary | ICD-10-CM | POA: Diagnosis not present

## 2019-03-07 DIAGNOSIS — D631 Anemia in chronic kidney disease: Secondary | ICD-10-CM | POA: Diagnosis not present

## 2019-03-07 DIAGNOSIS — N186 End stage renal disease: Secondary | ICD-10-CM | POA: Diagnosis not present

## 2019-03-10 DIAGNOSIS — D631 Anemia in chronic kidney disease: Secondary | ICD-10-CM | POA: Diagnosis not present

## 2019-03-10 DIAGNOSIS — N2581 Secondary hyperparathyroidism of renal origin: Secondary | ICD-10-CM | POA: Diagnosis not present

## 2019-03-10 DIAGNOSIS — N186 End stage renal disease: Secondary | ICD-10-CM | POA: Diagnosis not present

## 2019-03-12 DIAGNOSIS — N2581 Secondary hyperparathyroidism of renal origin: Secondary | ICD-10-CM | POA: Diagnosis not present

## 2019-03-12 DIAGNOSIS — N186 End stage renal disease: Secondary | ICD-10-CM | POA: Diagnosis not present

## 2019-03-12 DIAGNOSIS — D631 Anemia in chronic kidney disease: Secondary | ICD-10-CM | POA: Diagnosis not present

## 2019-03-14 ENCOUNTER — Telehealth: Payer: Self-pay | Admitting: Internal Medicine

## 2019-03-14 DIAGNOSIS — N2581 Secondary hyperparathyroidism of renal origin: Secondary | ICD-10-CM | POA: Diagnosis not present

## 2019-03-14 DIAGNOSIS — D631 Anemia in chronic kidney disease: Secondary | ICD-10-CM | POA: Diagnosis not present

## 2019-03-14 DIAGNOSIS — N186 End stage renal disease: Secondary | ICD-10-CM | POA: Diagnosis not present

## 2019-03-14 NOTE — Telephone Encounter (Signed)
Spoke to PCP he would like for pt to be seen on Monday if possible I will reach out to pt to schedule.

## 2019-03-14 NOTE — Telephone Encounter (Signed)
Pt wife called and right ear pain and is having confusion after dialysis. Please advise if patient can come in office

## 2019-03-14 NOTE — Telephone Encounter (Signed)
Spoke to pt and his wife, she stated that the message was incorrect earlier. He was not confused after dialysis but was  having right ear trouble and been having ear issues for about a month. He said that he can hear a little bit but not at his full potential. He also mentioned the right ear produces a lot of wax. He also stated that he has been trying to get an ENT referral for a while and is interested in seeing a specialist for the issue. Please advise.

## 2019-03-14 NOTE — Telephone Encounter (Signed)
No, he would really need to go to the ED, as this could be anything from low blood volume after dialysis, to electrolyte abnormal, or some kind of infection that has not been found yet

## 2019-03-17 ENCOUNTER — Encounter: Payer: Self-pay | Admitting: Internal Medicine

## 2019-03-17 ENCOUNTER — Other Ambulatory Visit: Payer: Self-pay

## 2019-03-17 ENCOUNTER — Other Ambulatory Visit (INDEPENDENT_AMBULATORY_CARE_PROVIDER_SITE_OTHER): Payer: Medicare Other

## 2019-03-17 ENCOUNTER — Ambulatory Visit (INDEPENDENT_AMBULATORY_CARE_PROVIDER_SITE_OTHER): Payer: Medicare Other | Admitting: Internal Medicine

## 2019-03-17 ENCOUNTER — Telehealth: Payer: Self-pay

## 2019-03-17 VITALS — BP 138/60 | HR 72 | Temp 97.6°F | Ht 69.0 in | Wt 141.0 lb

## 2019-03-17 DIAGNOSIS — I951 Orthostatic hypotension: Secondary | ICD-10-CM | POA: Diagnosis not present

## 2019-03-17 DIAGNOSIS — N2581 Secondary hyperparathyroidism of renal origin: Secondary | ICD-10-CM | POA: Diagnosis not present

## 2019-03-17 DIAGNOSIS — D631 Anemia in chronic kidney disease: Secondary | ICD-10-CM | POA: Diagnosis not present

## 2019-03-17 DIAGNOSIS — Z992 Dependence on renal dialysis: Secondary | ICD-10-CM | POA: Diagnosis not present

## 2019-03-17 DIAGNOSIS — N186 End stage renal disease: Secondary | ICD-10-CM

## 2019-03-17 DIAGNOSIS — I10 Essential (primary) hypertension: Secondary | ICD-10-CM

## 2019-03-17 DIAGNOSIS — E785 Hyperlipidemia, unspecified: Secondary | ICD-10-CM | POA: Diagnosis not present

## 2019-03-17 DIAGNOSIS — H6123 Impacted cerumen, bilateral: Secondary | ICD-10-CM | POA: Diagnosis not present

## 2019-03-17 LAB — BASIC METABOLIC PANEL
BUN: 19 mg/dL (ref 6–23)
CO2: 36 mEq/L — ABNORMAL HIGH (ref 19–32)
Calcium: 9.2 mg/dL (ref 8.4–10.5)
Chloride: 90 mEq/L — ABNORMAL LOW (ref 96–112)
Creatinine, Ser: 5.4 mg/dL (ref 0.40–1.50)
GFR: 12.2 mL/min — CL (ref >60.00)
Glucose, Bld: 94 mg/dL (ref 70–99)
Potassium: 4.4 mEq/L (ref 3.5–5.1)
Sodium: 138 mEq/L (ref 135–145)

## 2019-03-17 LAB — HEPATIC FUNCTION PANEL
ALT: 10 U/L (ref 0–53)
AST: 16 U/L (ref 0–37)
Albumin: 4.9 g/dL (ref 3.5–5.2)
Alkaline Phosphatase: 79 U/L (ref 39–117)
Bilirubin, Direct: 0.1 mg/dL (ref 0.0–0.3)
Total Bilirubin: 0.6 mg/dL (ref 0.2–1.2)
Total Protein: 8 g/dL (ref 6.0–8.3)

## 2019-03-17 LAB — CBC WITH DIFFERENTIAL/PLATELET
Basophils Absolute: 0.1 10*3/uL (ref 0.0–0.1)
Basophils Relative: 1.1 % (ref 0.0–3.0)
Eosinophils Absolute: 0.2 10*3/uL (ref 0.0–0.7)
Eosinophils Relative: 3.9 % (ref 0.0–5.0)
HCT: 37.9 % — ABNORMAL LOW (ref 39.0–52.0)
Hemoglobin: 12.5 g/dL — ABNORMAL LOW (ref 13.0–17.0)
Lymphocytes Relative: 17.4 % (ref 12.0–46.0)
Lymphs Abs: 1.1 10*3/uL (ref 0.7–4.0)
MCHC: 33.1 g/dL (ref 30.0–36.0)
MCV: 93.6 fl (ref 78.0–100.0)
Monocytes Absolute: 1.1 10*3/uL — ABNORMAL HIGH (ref 0.1–1.0)
Monocytes Relative: 17.7 % — ABNORMAL HIGH (ref 3.0–12.0)
Neutro Abs: 3.7 10*3/uL (ref 1.4–7.7)
Neutrophils Relative %: 59.9 % (ref 43.0–77.0)
Platelets: 147 10*3/uL — ABNORMAL LOW (ref 150.0–400.0)
RBC: 4.05 Mil/uL — ABNORMAL LOW (ref 4.22–5.81)
RDW: 19.8 % — ABNORMAL HIGH (ref 11.5–15.5)
WBC: 6.2 10*3/uL (ref 4.0–10.5)

## 2019-03-17 LAB — LIPID PANEL
Cholesterol: 223 mg/dL — ABNORMAL HIGH (ref 0–200)
HDL: 89.4 mg/dL (ref >39.00)
LDL Cholesterol: 123 mg/dL — ABNORMAL HIGH (ref 0–99)
NonHDL: 134.08
Total CHOL/HDL Ratio: 2
Triglycerides: 57 mg/dL (ref 0.0–149.0)
VLDL: 11.4 mg/dL (ref 0.0–40.0)

## 2019-03-17 NOTE — Progress Notes (Signed)
Patient consent obtained. Irrigation with water and peroxide performed. Full view of tympanic membranes after procedure.  Patient tolerated procedure well.   

## 2019-03-17 NOTE — Patient Instructions (Signed)
Your Ears were irrigated of wax today  Please continue all other medications as before, and refills have been done if requested.  Please have the pharmacy call with any other refills you may need.  Please continue your efforts at being more active, low cholesterol diet, and weight control.  Please keep your appointments with your specialists as you may have planned  Please go to the LAB in the Basement (turn left off the elevator) for the tests to be done today  You will be contacted by phone if any changes need to be made immediately.  Otherwise, you will receive a letter about your results with an explanation, but please check with MyChart first.  Please return in 6 months, or sooner if needed

## 2019-03-17 NOTE — Telephone Encounter (Signed)
CRITICAL VALUE STICKER  CRITICAL VALUE: Creatinine 5.4, GFR 12.2   RECEIVER (on-site recipient of call): Briana G  DATE & TIME NOTIFIED: 03/17/2019, 4:20  MESSENGER (representative from lab): Hope  MD NOTIFIED: Message sent   TIME OF NOTIFICATION:  RESPONSE:

## 2019-03-17 NOTE — Progress Notes (Signed)
Subjective:    Patient ID: [name], male    DOB: [birth year], 85 y.o.   MRN: [id number]  HPI    Here to f/u; overall doing ok,  Pt denies chest pain, increasing sob or doe, wheezing, orthopnea, PND, increased LE swelling, palpitations, or syncope but does have occasional dizziness after HD that slows him for the rest of the day, but always seems to resolvel   Pt denies new neurological symptoms such as new headache, or facial or extremity weakness or numbness.  Pt denies polydipsia, polyuria, or low sugar episode.  Pt states overall good compliance with meds, mostly trying to follow appropriate diet, with wt overall stable,  but little exercise however.  Also with bilateral hearing loss without pain or d/c in the past week, thinks maybe wax again.  No overt bleeding.   Pt denies fever, wt loss, night sweats, loss of appetite, or other constitutional symptoms  Has HD M-W-F Past Medical History:  Diagnosis Date  . ANEMIA-IRON DEFICIENCY 03/09/2008  . Arthritis    cervical spine.   . Complex renal cyst 06/19/2011  . Depression 03/26/2014  . DIVERTICULOSIS, COLON 03/09/2008  . ESRD (end stage renal disease) ([place]) 03/09/2008  . Flu 12/08/2016  . GOUT 03/09/2008  . Hemorrhoids 08/2009   internal.   . History of blood transfusion   . HYPERLIPIDEMIA 03/09/2008  . Hyperparathyroidism ([place])   . HYPERTENSION 03/09/2008  . Prostate cancer [facility]) 2002   Completed external beam radiation 2003.per HPI  . PROSTATE CANCER, HX OF 03/09/2008  . Radiation cystitis 2010.  Marland Kitchen Stroke [facility])    Past Surgical History:  Procedure Laterality Date  . AV FISTULA PLACEMENT Left 01/24/2013   Procedure: INSERTION OF ARTERIOVENOUS (AV) GORE-TEX GRAFT ARM;  Surgeon: Rosetta Posner, MD;  Location: [place];  Service: Vascular;  Laterality: Left;  . COLONOSCOPY N/A 06/17/2015   Procedure: COLONOSCOPY;  Surgeon: Gatha Mayer, MD;  Location: [place];  Service: Endoscopy;  Laterality: N/A;  . ESOPHAGOGASTRODUODENOSCOPY N/A  06/17/2015   Procedure: ESOPHAGOGASTRODUODENOSCOPY (EGD);  Surgeon: Gatha Mayer, MD;  Location[facility] END[facility]COPY;  Service: Endoscopy;  Laterality: N/A;  . FLEXIBLE SIGMOIDOSCOPY N/A 06/15/2015   Procedure: FLEXIBLE SIGMOIDOSCOPY;  Surgeon: Gatha Mayer, MD;  Location: [place];  Service: Endoscopy;  Laterality: N/A;  . GIVENS CAPSULE STUDY N/A 07/06/2015   Procedure: GIVENS CAPSULE STUDY;  Surgeon: Milus Banister, MD;  Location: [place];  Service: Endoscopy;  Laterality: N/A;  . JOINT REPLACEMENT     HIP  . rotator cuff repair right  4/08  . s/p left hip replacement  2007   Dr. Percell Miller ortho  . TONSILLECTOMY      reports that he quit smoking about 36 years ago. His smoking use included cigarettes. He quit after 5.00 years of use. He has never used smokeless tobacco. He reports that he does not drink alcohol or use drugs. family history includes Cancer in his brother and father; Diabetes in his father; Hypertension in his brother and father. No Known Allergies Current Outpatient Medications on File Prior to Visit  Medication Sig Dispense Refill  . allopurinol (ZYLOPRIM) 100 MG tablet Take 1 tablet (100 mg total) by mouth daily. 90 tablet 3  . aspirin EC 81 MG tablet Take 1 tablet (81 mg total) by mouth daily.    Lorin Picket 1 GM 210 MG(Fe) tablet TAKE 2 TABLETS BY MOUTH THREE TIMES A DAY WITH MEALS.   SWALLOW WHOLE, DO NOT CHEW OR  CRUSH MEDICATION  10  . citalopram (CELEXA) 10 MG tablet Take 1 tablet (10 mg total) by mouth daily. 90 tablet 1  . latanoprost (XALATAN) 0.005 % ophthalmic solution INSTILL 1 DROP BY OPHTHALMIC ROUTE EVERY DAY INTO BOTH EYE(S) IN THE EVENING  5  . lidocaine-prilocaine (EMLA) cream APPLY SMALL AMOUNT TO ACCESS SITE (AVF) 1 HOUR BEFORE DIALYSIS. COVER WITH OCCLUSIVE DRESSING (SARAN WRAP)  11  . midodrine (PROAMATINE) 10 MG tablet Take 1 tablet (10 mg total) by mouth 3 (three) times daily. Take in the morning, lunch time and 4pm. Do not take after 4pm. 270 tablet  3  . Multiple Vitamin (MULTIVITAMIN WITH MINERALS) TABS tablet Take 1 tablet by mouth daily.    Marland Kitchen nystatin (MYCOSTATIN/NYSTOP) powder Use as directed twice per day (Patient taking differently: Apply 1 g topically 2 (two) times daily as needed (rash). ) 45 g 1  . rosuvastatin (CRESTOR) 20 MG tablet Take 1 tablet (20 mg total) by mouth at bedtime. 90 tablet 3   No current facility-administered medications on file prior to visit.    Review of Systems  Constitutional: Negative for other unusual diaphoresis or sweats HENT: Negative for ear discharge or swelling Eyes: Negative for other worsening visual disturbances Respiratory: Negative for stridor or other swelling  Gastrointestinal: Negative for worsening distension or other blood Genitourinary: Negative for retention or other urinary change Musculoskeletal: Negative for other MSK pain or swelling Skin: Negative for color change or other new lesions Neurological: Negative for worsening tremors and other numbness  Psychiatric/Behavioral: Negative for worsening agitation or other fatigue ALl other system neg per pt    Objective:   Physical Exam BP 138/60 (BP Location: Right Arm, Patient Position: Sitting, Cuff Size: Normal)   Pulse 72   Temp 97.6 F (36.4 C) (Oral)   Ht 5\' 9"  (1.753 m)   Wt 141 lb (64 kg)   BMI 20.82 kg/m  VS noted,  Constitutional: Pt appears in NAD HENT: Head: NCAT.  Right Ear: External ear normal.  Left Ear: External ear normal.  Bilateral wax impactions irrigated clear and hearing improved Eyes: . Pupils are equal, round, and reactive to light. Conjunctivae and EOM are normal Nose: without d/c or deformity Neck: Neck supple. Gross normal ROM Cardiovascular: Normal rate and regular rhythm.   Pulmonary/Chest: Effort normal and breath sounds without rales or wheezing.  Abd:  Soft, NT, ND, + BS, no organomegaly Neurological: Pt is alert. At baseline orientation, motor grossly intact Skin: Skin is warm. No  rashes, other new lesions, no LE edema, + thrill LUE Psychiatric: Pt behavior is normal without agitation  No other exam findings Lab Results  Component Value Date   WBC 6.2 03/17/2019   HGB 12.5 (L) 03/17/2019   HCT 37.9 (L) 03/17/2019   PLT 147.0 (L) 03/17/2019   GLUCOSE 94 03/17/2019   CHOL 223 (H) 03/17/2019   TRIG 57.0 03/17/2019   HDL 89.40 03/17/2019   LDLCALC 123 (H) 03/17/2019   ALT 10 03/17/2019   AST 16 03/17/2019   NA 138 03/17/2019   K 4.4 03/17/2019   CL 90 (L) 03/17/2019   CREATININE 5.40 (HH) 03/17/2019   BUN 19 03/17/2019   CO2 36 (H) 03/17/2019   TSH 0.83 10/10/2018   INR 1.24 10/05/2016   HGBA1C 4.9 07/05/2015      Assessment & Plan:

## 2019-03-19 ENCOUNTER — Encounter: Payer: Self-pay | Admitting: Internal Medicine

## 2019-03-19 DIAGNOSIS — N2581 Secondary hyperparathyroidism of renal origin: Secondary | ICD-10-CM | POA: Diagnosis not present

## 2019-03-19 DIAGNOSIS — N186 End stage renal disease: Secondary | ICD-10-CM | POA: Diagnosis not present

## 2019-03-19 DIAGNOSIS — D631 Anemia in chronic kidney disease: Secondary | ICD-10-CM | POA: Diagnosis not present

## 2019-03-19 NOTE — Assessment & Plan Note (Signed)
stable overall by history and exam, recent data reviewed with pt, and pt to continue medical treatment as before,  to f/u any worsening symptoms or concerns, for lipids with labs 

## 2019-03-19 NOTE — Assessment & Plan Note (Signed)
Resolved with irrigation,  to f/u any worsening symptoms or concerns 

## 2019-03-19 NOTE — Assessment & Plan Note (Signed)
stable overall by history and exam, recent data reviewed with pt, and pt to continue medical treatment as before,  to f/u any worsening symptoms or concerns, for lab today

## 2019-03-19 NOTE — Assessment & Plan Note (Signed)
stable overall by history and exam, recent data reviewed with pt, and pt to continue medical treatment as before,  to f/u any worsening symptoms or concerns  

## 2019-03-19 NOTE — Assessment & Plan Note (Signed)
Suggested to wife he may need to reconsider dry wt with dialysis, she will ask

## 2019-03-21 DIAGNOSIS — D631 Anemia in chronic kidney disease: Secondary | ICD-10-CM | POA: Diagnosis not present

## 2019-03-21 DIAGNOSIS — N186 End stage renal disease: Secondary | ICD-10-CM | POA: Diagnosis not present

## 2019-03-21 DIAGNOSIS — N2581 Secondary hyperparathyroidism of renal origin: Secondary | ICD-10-CM | POA: Diagnosis not present

## 2019-03-24 DIAGNOSIS — N186 End stage renal disease: Secondary | ICD-10-CM | POA: Diagnosis not present

## 2019-03-24 DIAGNOSIS — N2581 Secondary hyperparathyroidism of renal origin: Secondary | ICD-10-CM | POA: Diagnosis not present

## 2019-03-24 DIAGNOSIS — D631 Anemia in chronic kidney disease: Secondary | ICD-10-CM | POA: Diagnosis not present

## 2019-03-24 NOTE — Telephone Encounter (Signed)
Erroneous encounter

## 2019-03-26 DIAGNOSIS — N186 End stage renal disease: Secondary | ICD-10-CM | POA: Diagnosis not present

## 2019-03-26 DIAGNOSIS — N2581 Secondary hyperparathyroidism of renal origin: Secondary | ICD-10-CM | POA: Diagnosis not present

## 2019-03-26 DIAGNOSIS — D631 Anemia in chronic kidney disease: Secondary | ICD-10-CM | POA: Diagnosis not present

## 2019-03-28 DIAGNOSIS — D631 Anemia in chronic kidney disease: Secondary | ICD-10-CM | POA: Diagnosis not present

## 2019-03-28 DIAGNOSIS — N186 End stage renal disease: Secondary | ICD-10-CM | POA: Diagnosis not present

## 2019-03-28 DIAGNOSIS — N2581 Secondary hyperparathyroidism of renal origin: Secondary | ICD-10-CM | POA: Diagnosis not present

## 2019-03-31 DIAGNOSIS — N186 End stage renal disease: Secondary | ICD-10-CM | POA: Diagnosis not present

## 2019-03-31 DIAGNOSIS — D631 Anemia in chronic kidney disease: Secondary | ICD-10-CM | POA: Diagnosis not present

## 2019-03-31 DIAGNOSIS — N2581 Secondary hyperparathyroidism of renal origin: Secondary | ICD-10-CM | POA: Diagnosis not present

## 2019-04-02 DIAGNOSIS — N186 End stage renal disease: Secondary | ICD-10-CM | POA: Diagnosis not present

## 2019-04-02 DIAGNOSIS — D631 Anemia in chronic kidney disease: Secondary | ICD-10-CM | POA: Diagnosis not present

## 2019-04-02 DIAGNOSIS — N2581 Secondary hyperparathyroidism of renal origin: Secondary | ICD-10-CM | POA: Diagnosis not present

## 2019-04-04 DIAGNOSIS — N186 End stage renal disease: Secondary | ICD-10-CM | POA: Diagnosis not present

## 2019-04-04 DIAGNOSIS — N2581 Secondary hyperparathyroidism of renal origin: Secondary | ICD-10-CM | POA: Diagnosis not present

## 2019-04-04 DIAGNOSIS — D631 Anemia in chronic kidney disease: Secondary | ICD-10-CM | POA: Diagnosis not present

## 2019-04-07 DIAGNOSIS — N186 End stage renal disease: Secondary | ICD-10-CM | POA: Diagnosis not present

## 2019-04-07 DIAGNOSIS — N2581 Secondary hyperparathyroidism of renal origin: Secondary | ICD-10-CM | POA: Diagnosis not present

## 2019-04-07 DIAGNOSIS — Z992 Dependence on renal dialysis: Secondary | ICD-10-CM | POA: Diagnosis not present

## 2019-04-07 DIAGNOSIS — I158 Other secondary hypertension: Secondary | ICD-10-CM | POA: Diagnosis not present

## 2019-04-07 DIAGNOSIS — D509 Iron deficiency anemia, unspecified: Secondary | ICD-10-CM | POA: Diagnosis not present

## 2019-04-07 DIAGNOSIS — D631 Anemia in chronic kidney disease: Secondary | ICD-10-CM | POA: Diagnosis not present

## 2019-04-09 DIAGNOSIS — D631 Anemia in chronic kidney disease: Secondary | ICD-10-CM | POA: Diagnosis not present

## 2019-04-09 DIAGNOSIS — D509 Iron deficiency anemia, unspecified: Secondary | ICD-10-CM | POA: Diagnosis not present

## 2019-04-09 DIAGNOSIS — N186 End stage renal disease: Secondary | ICD-10-CM | POA: Diagnosis not present

## 2019-04-09 DIAGNOSIS — N2581 Secondary hyperparathyroidism of renal origin: Secondary | ICD-10-CM | POA: Diagnosis not present

## 2019-04-11 DIAGNOSIS — N2581 Secondary hyperparathyroidism of renal origin: Secondary | ICD-10-CM | POA: Diagnosis not present

## 2019-04-11 DIAGNOSIS — D631 Anemia in chronic kidney disease: Secondary | ICD-10-CM | POA: Diagnosis not present

## 2019-04-11 DIAGNOSIS — D509 Iron deficiency anemia, unspecified: Secondary | ICD-10-CM | POA: Diagnosis not present

## 2019-04-11 DIAGNOSIS — N186 End stage renal disease: Secondary | ICD-10-CM | POA: Diagnosis not present

## 2019-04-14 DIAGNOSIS — D631 Anemia in chronic kidney disease: Secondary | ICD-10-CM | POA: Diagnosis not present

## 2019-04-14 DIAGNOSIS — D509 Iron deficiency anemia, unspecified: Secondary | ICD-10-CM | POA: Diagnosis not present

## 2019-04-14 DIAGNOSIS — N186 End stage renal disease: Secondary | ICD-10-CM | POA: Diagnosis not present

## 2019-04-14 DIAGNOSIS — N2581 Secondary hyperparathyroidism of renal origin: Secondary | ICD-10-CM | POA: Diagnosis not present

## 2019-04-16 DIAGNOSIS — D509 Iron deficiency anemia, unspecified: Secondary | ICD-10-CM | POA: Diagnosis not present

## 2019-04-16 DIAGNOSIS — N2581 Secondary hyperparathyroidism of renal origin: Secondary | ICD-10-CM | POA: Diagnosis not present

## 2019-04-16 DIAGNOSIS — D631 Anemia in chronic kidney disease: Secondary | ICD-10-CM | POA: Diagnosis not present

## 2019-04-16 DIAGNOSIS — N186 End stage renal disease: Secondary | ICD-10-CM | POA: Diagnosis not present

## 2019-04-18 DIAGNOSIS — D509 Iron deficiency anemia, unspecified: Secondary | ICD-10-CM | POA: Diagnosis not present

## 2019-04-18 DIAGNOSIS — N2581 Secondary hyperparathyroidism of renal origin: Secondary | ICD-10-CM | POA: Diagnosis not present

## 2019-04-18 DIAGNOSIS — D631 Anemia in chronic kidney disease: Secondary | ICD-10-CM | POA: Diagnosis not present

## 2019-04-18 DIAGNOSIS — N186 End stage renal disease: Secondary | ICD-10-CM | POA: Diagnosis not present

## 2019-04-21 DIAGNOSIS — N186 End stage renal disease: Secondary | ICD-10-CM | POA: Diagnosis not present

## 2019-04-21 DIAGNOSIS — D509 Iron deficiency anemia, unspecified: Secondary | ICD-10-CM | POA: Diagnosis not present

## 2019-04-21 DIAGNOSIS — D631 Anemia in chronic kidney disease: Secondary | ICD-10-CM | POA: Diagnosis not present

## 2019-04-21 DIAGNOSIS — N2581 Secondary hyperparathyroidism of renal origin: Secondary | ICD-10-CM | POA: Diagnosis not present

## 2019-04-23 DIAGNOSIS — N186 End stage renal disease: Secondary | ICD-10-CM | POA: Diagnosis not present

## 2019-04-23 DIAGNOSIS — D631 Anemia in chronic kidney disease: Secondary | ICD-10-CM | POA: Diagnosis not present

## 2019-04-23 DIAGNOSIS — D509 Iron deficiency anemia, unspecified: Secondary | ICD-10-CM | POA: Diagnosis not present

## 2019-04-23 DIAGNOSIS — N2581 Secondary hyperparathyroidism of renal origin: Secondary | ICD-10-CM | POA: Diagnosis not present

## 2019-04-25 DIAGNOSIS — N186 End stage renal disease: Secondary | ICD-10-CM | POA: Diagnosis not present

## 2019-04-25 DIAGNOSIS — D631 Anemia in chronic kidney disease: Secondary | ICD-10-CM | POA: Diagnosis not present

## 2019-04-25 DIAGNOSIS — D509 Iron deficiency anemia, unspecified: Secondary | ICD-10-CM | POA: Diagnosis not present

## 2019-04-25 DIAGNOSIS — N2581 Secondary hyperparathyroidism of renal origin: Secondary | ICD-10-CM | POA: Diagnosis not present

## 2019-04-28 DIAGNOSIS — N186 End stage renal disease: Secondary | ICD-10-CM | POA: Diagnosis not present

## 2019-04-28 DIAGNOSIS — D631 Anemia in chronic kidney disease: Secondary | ICD-10-CM | POA: Diagnosis not present

## 2019-04-28 DIAGNOSIS — N2581 Secondary hyperparathyroidism of renal origin: Secondary | ICD-10-CM | POA: Diagnosis not present

## 2019-04-28 DIAGNOSIS — D509 Iron deficiency anemia, unspecified: Secondary | ICD-10-CM | POA: Diagnosis not present

## 2019-04-30 DIAGNOSIS — D631 Anemia in chronic kidney disease: Secondary | ICD-10-CM | POA: Diagnosis not present

## 2019-04-30 DIAGNOSIS — N2581 Secondary hyperparathyroidism of renal origin: Secondary | ICD-10-CM | POA: Diagnosis not present

## 2019-04-30 DIAGNOSIS — N186 End stage renal disease: Secondary | ICD-10-CM | POA: Diagnosis not present

## 2019-04-30 DIAGNOSIS — D509 Iron deficiency anemia, unspecified: Secondary | ICD-10-CM | POA: Diagnosis not present

## 2019-05-02 DIAGNOSIS — N186 End stage renal disease: Secondary | ICD-10-CM | POA: Diagnosis not present

## 2019-05-02 DIAGNOSIS — D509 Iron deficiency anemia, unspecified: Secondary | ICD-10-CM | POA: Diagnosis not present

## 2019-05-02 DIAGNOSIS — D631 Anemia in chronic kidney disease: Secondary | ICD-10-CM | POA: Diagnosis not present

## 2019-05-02 DIAGNOSIS — N2581 Secondary hyperparathyroidism of renal origin: Secondary | ICD-10-CM | POA: Diagnosis not present

## 2019-05-05 DIAGNOSIS — N2581 Secondary hyperparathyroidism of renal origin: Secondary | ICD-10-CM | POA: Diagnosis not present

## 2019-05-05 DIAGNOSIS — D509 Iron deficiency anemia, unspecified: Secondary | ICD-10-CM | POA: Diagnosis not present

## 2019-05-05 DIAGNOSIS — D631 Anemia in chronic kidney disease: Secondary | ICD-10-CM | POA: Diagnosis not present

## 2019-05-05 DIAGNOSIS — N186 End stage renal disease: Secondary | ICD-10-CM | POA: Diagnosis not present

## 2019-05-07 DIAGNOSIS — N186 End stage renal disease: Secondary | ICD-10-CM | POA: Diagnosis not present

## 2019-05-07 DIAGNOSIS — D509 Iron deficiency anemia, unspecified: Secondary | ICD-10-CM | POA: Diagnosis not present

## 2019-05-07 DIAGNOSIS — I158 Other secondary hypertension: Secondary | ICD-10-CM | POA: Diagnosis not present

## 2019-05-07 DIAGNOSIS — D631 Anemia in chronic kidney disease: Secondary | ICD-10-CM | POA: Diagnosis not present

## 2019-05-07 DIAGNOSIS — Z992 Dependence on renal dialysis: Secondary | ICD-10-CM | POA: Diagnosis not present

## 2019-05-07 DIAGNOSIS — N2581 Secondary hyperparathyroidism of renal origin: Secondary | ICD-10-CM | POA: Diagnosis not present

## 2019-05-09 DIAGNOSIS — N186 End stage renal disease: Secondary | ICD-10-CM | POA: Diagnosis not present

## 2019-05-09 DIAGNOSIS — N2581 Secondary hyperparathyroidism of renal origin: Secondary | ICD-10-CM | POA: Diagnosis not present

## 2019-05-09 DIAGNOSIS — D631 Anemia in chronic kidney disease: Secondary | ICD-10-CM | POA: Diagnosis not present

## 2019-05-09 DIAGNOSIS — D509 Iron deficiency anemia, unspecified: Secondary | ICD-10-CM | POA: Diagnosis not present

## 2019-05-12 DIAGNOSIS — D631 Anemia in chronic kidney disease: Secondary | ICD-10-CM | POA: Diagnosis not present

## 2019-05-12 DIAGNOSIS — N186 End stage renal disease: Secondary | ICD-10-CM | POA: Diagnosis not present

## 2019-05-12 DIAGNOSIS — N2581 Secondary hyperparathyroidism of renal origin: Secondary | ICD-10-CM | POA: Diagnosis not present

## 2019-05-12 DIAGNOSIS — D509 Iron deficiency anemia, unspecified: Secondary | ICD-10-CM | POA: Diagnosis not present

## 2019-05-14 DIAGNOSIS — D509 Iron deficiency anemia, unspecified: Secondary | ICD-10-CM | POA: Diagnosis not present

## 2019-05-14 DIAGNOSIS — D631 Anemia in chronic kidney disease: Secondary | ICD-10-CM | POA: Diagnosis not present

## 2019-05-14 DIAGNOSIS — N186 End stage renal disease: Secondary | ICD-10-CM | POA: Diagnosis not present

## 2019-05-14 DIAGNOSIS — N2581 Secondary hyperparathyroidism of renal origin: Secondary | ICD-10-CM | POA: Diagnosis not present

## 2019-05-16 DIAGNOSIS — N186 End stage renal disease: Secondary | ICD-10-CM | POA: Diagnosis not present

## 2019-05-16 DIAGNOSIS — N2581 Secondary hyperparathyroidism of renal origin: Secondary | ICD-10-CM | POA: Diagnosis not present

## 2019-05-16 DIAGNOSIS — D631 Anemia in chronic kidney disease: Secondary | ICD-10-CM | POA: Diagnosis not present

## 2019-05-16 DIAGNOSIS — D509 Iron deficiency anemia, unspecified: Secondary | ICD-10-CM | POA: Diagnosis not present

## 2019-05-19 DIAGNOSIS — N186 End stage renal disease: Secondary | ICD-10-CM | POA: Diagnosis not present

## 2019-05-19 DIAGNOSIS — N2581 Secondary hyperparathyroidism of renal origin: Secondary | ICD-10-CM | POA: Diagnosis not present

## 2019-05-19 DIAGNOSIS — D509 Iron deficiency anemia, unspecified: Secondary | ICD-10-CM | POA: Diagnosis not present

## 2019-05-19 DIAGNOSIS — D631 Anemia in chronic kidney disease: Secondary | ICD-10-CM | POA: Diagnosis not present

## 2019-05-21 DIAGNOSIS — N2581 Secondary hyperparathyroidism of renal origin: Secondary | ICD-10-CM | POA: Diagnosis not present

## 2019-05-21 DIAGNOSIS — D509 Iron deficiency anemia, unspecified: Secondary | ICD-10-CM | POA: Diagnosis not present

## 2019-05-21 DIAGNOSIS — N186 End stage renal disease: Secondary | ICD-10-CM | POA: Diagnosis not present

## 2019-05-21 DIAGNOSIS — D631 Anemia in chronic kidney disease: Secondary | ICD-10-CM | POA: Diagnosis not present

## 2019-05-23 DIAGNOSIS — D631 Anemia in chronic kidney disease: Secondary | ICD-10-CM | POA: Diagnosis not present

## 2019-05-23 DIAGNOSIS — N186 End stage renal disease: Secondary | ICD-10-CM | POA: Diagnosis not present

## 2019-05-23 DIAGNOSIS — D509 Iron deficiency anemia, unspecified: Secondary | ICD-10-CM | POA: Diagnosis not present

## 2019-05-23 DIAGNOSIS — N2581 Secondary hyperparathyroidism of renal origin: Secondary | ICD-10-CM | POA: Diagnosis not present

## 2019-05-26 DIAGNOSIS — D509 Iron deficiency anemia, unspecified: Secondary | ICD-10-CM | POA: Diagnosis not present

## 2019-05-26 DIAGNOSIS — N186 End stage renal disease: Secondary | ICD-10-CM | POA: Diagnosis not present

## 2019-05-26 DIAGNOSIS — D631 Anemia in chronic kidney disease: Secondary | ICD-10-CM | POA: Diagnosis not present

## 2019-05-26 DIAGNOSIS — N2581 Secondary hyperparathyroidism of renal origin: Secondary | ICD-10-CM | POA: Diagnosis not present

## 2019-05-28 DIAGNOSIS — N2581 Secondary hyperparathyroidism of renal origin: Secondary | ICD-10-CM | POA: Diagnosis not present

## 2019-05-28 DIAGNOSIS — D631 Anemia in chronic kidney disease: Secondary | ICD-10-CM | POA: Diagnosis not present

## 2019-05-28 DIAGNOSIS — N186 End stage renal disease: Secondary | ICD-10-CM | POA: Diagnosis not present

## 2019-05-28 DIAGNOSIS — D509 Iron deficiency anemia, unspecified: Secondary | ICD-10-CM | POA: Diagnosis not present

## 2019-05-30 DIAGNOSIS — D631 Anemia in chronic kidney disease: Secondary | ICD-10-CM | POA: Diagnosis not present

## 2019-05-30 DIAGNOSIS — D509 Iron deficiency anemia, unspecified: Secondary | ICD-10-CM | POA: Diagnosis not present

## 2019-05-30 DIAGNOSIS — N186 End stage renal disease: Secondary | ICD-10-CM | POA: Diagnosis not present

## 2019-05-30 DIAGNOSIS — N2581 Secondary hyperparathyroidism of renal origin: Secondary | ICD-10-CM | POA: Diagnosis not present

## 2019-06-02 DIAGNOSIS — D631 Anemia in chronic kidney disease: Secondary | ICD-10-CM | POA: Diagnosis not present

## 2019-06-02 DIAGNOSIS — D509 Iron deficiency anemia, unspecified: Secondary | ICD-10-CM | POA: Diagnosis not present

## 2019-06-02 DIAGNOSIS — N2581 Secondary hyperparathyroidism of renal origin: Secondary | ICD-10-CM | POA: Diagnosis not present

## 2019-06-02 DIAGNOSIS — N186 End stage renal disease: Secondary | ICD-10-CM | POA: Diagnosis not present

## 2019-06-04 DIAGNOSIS — N186 End stage renal disease: Secondary | ICD-10-CM | POA: Diagnosis not present

## 2019-06-04 DIAGNOSIS — N2581 Secondary hyperparathyroidism of renal origin: Secondary | ICD-10-CM | POA: Diagnosis not present

## 2019-06-04 DIAGNOSIS — D509 Iron deficiency anemia, unspecified: Secondary | ICD-10-CM | POA: Diagnosis not present

## 2019-06-04 DIAGNOSIS — D631 Anemia in chronic kidney disease: Secondary | ICD-10-CM | POA: Diagnosis not present

## 2019-06-05 DIAGNOSIS — C61 Malignant neoplasm of prostate: Secondary | ICD-10-CM | POA: Diagnosis not present

## 2019-06-06 DIAGNOSIS — N2581 Secondary hyperparathyroidism of renal origin: Secondary | ICD-10-CM | POA: Diagnosis not present

## 2019-06-06 DIAGNOSIS — D509 Iron deficiency anemia, unspecified: Secondary | ICD-10-CM | POA: Diagnosis not present

## 2019-06-06 DIAGNOSIS — N186 End stage renal disease: Secondary | ICD-10-CM | POA: Diagnosis not present

## 2019-06-06 DIAGNOSIS — D631 Anemia in chronic kidney disease: Secondary | ICD-10-CM | POA: Diagnosis not present

## 2019-06-07 DIAGNOSIS — N186 End stage renal disease: Secondary | ICD-10-CM | POA: Diagnosis not present

## 2019-06-07 DIAGNOSIS — Z992 Dependence on renal dialysis: Secondary | ICD-10-CM | POA: Diagnosis not present

## 2019-06-07 DIAGNOSIS — I158 Other secondary hypertension: Secondary | ICD-10-CM | POA: Diagnosis not present

## 2019-06-09 DIAGNOSIS — Z992 Dependence on renal dialysis: Secondary | ICD-10-CM | POA: Diagnosis not present

## 2019-06-09 DIAGNOSIS — D509 Iron deficiency anemia, unspecified: Secondary | ICD-10-CM | POA: Diagnosis not present

## 2019-06-09 DIAGNOSIS — N186 End stage renal disease: Secondary | ICD-10-CM | POA: Diagnosis not present

## 2019-06-09 DIAGNOSIS — N2581 Secondary hyperparathyroidism of renal origin: Secondary | ICD-10-CM | POA: Diagnosis not present

## 2019-06-11 DIAGNOSIS — Z992 Dependence on renal dialysis: Secondary | ICD-10-CM | POA: Diagnosis not present

## 2019-06-11 DIAGNOSIS — N186 End stage renal disease: Secondary | ICD-10-CM | POA: Diagnosis not present

## 2019-06-11 DIAGNOSIS — N2581 Secondary hyperparathyroidism of renal origin: Secondary | ICD-10-CM | POA: Diagnosis not present

## 2019-06-11 DIAGNOSIS — D509 Iron deficiency anemia, unspecified: Secondary | ICD-10-CM | POA: Diagnosis not present

## 2019-06-12 DIAGNOSIS — R9721 Rising PSA following treatment for malignant neoplasm of prostate: Secondary | ICD-10-CM | POA: Diagnosis not present

## 2019-06-12 DIAGNOSIS — C61 Malignant neoplasm of prostate: Secondary | ICD-10-CM | POA: Diagnosis not present

## 2019-06-12 DIAGNOSIS — E291 Testicular hypofunction: Secondary | ICD-10-CM | POA: Diagnosis not present

## 2019-06-13 DIAGNOSIS — N186 End stage renal disease: Secondary | ICD-10-CM | POA: Diagnosis not present

## 2019-06-13 DIAGNOSIS — N2581 Secondary hyperparathyroidism of renal origin: Secondary | ICD-10-CM | POA: Diagnosis not present

## 2019-06-13 DIAGNOSIS — D509 Iron deficiency anemia, unspecified: Secondary | ICD-10-CM | POA: Diagnosis not present

## 2019-06-13 DIAGNOSIS — Z992 Dependence on renal dialysis: Secondary | ICD-10-CM | POA: Diagnosis not present

## 2019-06-16 DIAGNOSIS — Z992 Dependence on renal dialysis: Secondary | ICD-10-CM | POA: Diagnosis not present

## 2019-06-16 DIAGNOSIS — N2581 Secondary hyperparathyroidism of renal origin: Secondary | ICD-10-CM | POA: Diagnosis not present

## 2019-06-16 DIAGNOSIS — D509 Iron deficiency anemia, unspecified: Secondary | ICD-10-CM | POA: Diagnosis not present

## 2019-06-16 DIAGNOSIS — N186 End stage renal disease: Secondary | ICD-10-CM | POA: Diagnosis not present

## 2019-06-18 DIAGNOSIS — D509 Iron deficiency anemia, unspecified: Secondary | ICD-10-CM | POA: Diagnosis not present

## 2019-06-18 DIAGNOSIS — N2581 Secondary hyperparathyroidism of renal origin: Secondary | ICD-10-CM | POA: Diagnosis not present

## 2019-06-18 DIAGNOSIS — Z992 Dependence on renal dialysis: Secondary | ICD-10-CM | POA: Diagnosis not present

## 2019-06-18 DIAGNOSIS — N186 End stage renal disease: Secondary | ICD-10-CM | POA: Diagnosis not present

## 2019-06-20 DIAGNOSIS — N2581 Secondary hyperparathyroidism of renal origin: Secondary | ICD-10-CM | POA: Diagnosis not present

## 2019-06-20 DIAGNOSIS — Z992 Dependence on renal dialysis: Secondary | ICD-10-CM | POA: Diagnosis not present

## 2019-06-20 DIAGNOSIS — D509 Iron deficiency anemia, unspecified: Secondary | ICD-10-CM | POA: Diagnosis not present

## 2019-06-20 DIAGNOSIS — N186 End stage renal disease: Secondary | ICD-10-CM | POA: Diagnosis not present

## 2019-06-23 DIAGNOSIS — N2581 Secondary hyperparathyroidism of renal origin: Secondary | ICD-10-CM | POA: Diagnosis not present

## 2019-06-23 DIAGNOSIS — D509 Iron deficiency anemia, unspecified: Secondary | ICD-10-CM | POA: Diagnosis not present

## 2019-06-23 DIAGNOSIS — Z992 Dependence on renal dialysis: Secondary | ICD-10-CM | POA: Diagnosis not present

## 2019-06-23 DIAGNOSIS — N186 End stage renal disease: Secondary | ICD-10-CM | POA: Diagnosis not present

## 2019-06-25 DIAGNOSIS — N2581 Secondary hyperparathyroidism of renal origin: Secondary | ICD-10-CM | POA: Diagnosis not present

## 2019-06-25 DIAGNOSIS — D509 Iron deficiency anemia, unspecified: Secondary | ICD-10-CM | POA: Diagnosis not present

## 2019-06-25 DIAGNOSIS — Z992 Dependence on renal dialysis: Secondary | ICD-10-CM | POA: Diagnosis not present

## 2019-06-25 DIAGNOSIS — N186 End stage renal disease: Secondary | ICD-10-CM | POA: Diagnosis not present

## 2019-06-27 DIAGNOSIS — Z992 Dependence on renal dialysis: Secondary | ICD-10-CM | POA: Diagnosis not present

## 2019-06-27 DIAGNOSIS — D509 Iron deficiency anemia, unspecified: Secondary | ICD-10-CM | POA: Diagnosis not present

## 2019-06-27 DIAGNOSIS — N186 End stage renal disease: Secondary | ICD-10-CM | POA: Diagnosis not present

## 2019-06-27 DIAGNOSIS — N2581 Secondary hyperparathyroidism of renal origin: Secondary | ICD-10-CM | POA: Diagnosis not present

## 2019-06-30 DIAGNOSIS — N186 End stage renal disease: Secondary | ICD-10-CM | POA: Diagnosis not present

## 2019-06-30 DIAGNOSIS — N2581 Secondary hyperparathyroidism of renal origin: Secondary | ICD-10-CM | POA: Diagnosis not present

## 2019-06-30 DIAGNOSIS — Z992 Dependence on renal dialysis: Secondary | ICD-10-CM | POA: Diagnosis not present

## 2019-06-30 DIAGNOSIS — D509 Iron deficiency anemia, unspecified: Secondary | ICD-10-CM | POA: Diagnosis not present

## 2019-07-02 DIAGNOSIS — N186 End stage renal disease: Secondary | ICD-10-CM | POA: Diagnosis not present

## 2019-07-02 DIAGNOSIS — N2581 Secondary hyperparathyroidism of renal origin: Secondary | ICD-10-CM | POA: Diagnosis not present

## 2019-07-02 DIAGNOSIS — D509 Iron deficiency anemia, unspecified: Secondary | ICD-10-CM | POA: Diagnosis not present

## 2019-07-02 DIAGNOSIS — Z992 Dependence on renal dialysis: Secondary | ICD-10-CM | POA: Diagnosis not present

## 2019-07-04 DIAGNOSIS — N2581 Secondary hyperparathyroidism of renal origin: Secondary | ICD-10-CM | POA: Diagnosis not present

## 2019-07-04 DIAGNOSIS — Z992 Dependence on renal dialysis: Secondary | ICD-10-CM | POA: Diagnosis not present

## 2019-07-04 DIAGNOSIS — N186 End stage renal disease: Secondary | ICD-10-CM | POA: Diagnosis not present

## 2019-07-04 DIAGNOSIS — D509 Iron deficiency anemia, unspecified: Secondary | ICD-10-CM | POA: Diagnosis not present

## 2019-07-07 DIAGNOSIS — N186 End stage renal disease: Secondary | ICD-10-CM | POA: Diagnosis not present

## 2019-07-07 DIAGNOSIS — N2581 Secondary hyperparathyroidism of renal origin: Secondary | ICD-10-CM | POA: Diagnosis not present

## 2019-07-07 DIAGNOSIS — D509 Iron deficiency anemia, unspecified: Secondary | ICD-10-CM | POA: Diagnosis not present

## 2019-07-07 DIAGNOSIS — Z992 Dependence on renal dialysis: Secondary | ICD-10-CM | POA: Diagnosis not present

## 2019-07-08 DIAGNOSIS — N186 End stage renal disease: Secondary | ICD-10-CM | POA: Diagnosis not present

## 2019-07-08 DIAGNOSIS — Z992 Dependence on renal dialysis: Secondary | ICD-10-CM | POA: Diagnosis not present

## 2019-07-08 DIAGNOSIS — I158 Other secondary hypertension: Secondary | ICD-10-CM | POA: Diagnosis not present

## 2019-07-09 DIAGNOSIS — N186 End stage renal disease: Secondary | ICD-10-CM | POA: Diagnosis not present

## 2019-07-09 DIAGNOSIS — Z992 Dependence on renal dialysis: Secondary | ICD-10-CM | POA: Diagnosis not present

## 2019-07-09 DIAGNOSIS — N2581 Secondary hyperparathyroidism of renal origin: Secondary | ICD-10-CM | POA: Diagnosis not present

## 2019-07-09 DIAGNOSIS — D631 Anemia in chronic kidney disease: Secondary | ICD-10-CM | POA: Diagnosis not present

## 2019-07-11 DIAGNOSIS — N186 End stage renal disease: Secondary | ICD-10-CM | POA: Diagnosis not present

## 2019-07-11 DIAGNOSIS — D631 Anemia in chronic kidney disease: Secondary | ICD-10-CM | POA: Diagnosis not present

## 2019-07-11 DIAGNOSIS — N2581 Secondary hyperparathyroidism of renal origin: Secondary | ICD-10-CM | POA: Diagnosis not present

## 2019-07-11 DIAGNOSIS — Z992 Dependence on renal dialysis: Secondary | ICD-10-CM | POA: Diagnosis not present

## 2019-07-14 DIAGNOSIS — N2581 Secondary hyperparathyroidism of renal origin: Secondary | ICD-10-CM | POA: Diagnosis not present

## 2019-07-14 DIAGNOSIS — N186 End stage renal disease: Secondary | ICD-10-CM | POA: Diagnosis not present

## 2019-07-14 DIAGNOSIS — D631 Anemia in chronic kidney disease: Secondary | ICD-10-CM | POA: Diagnosis not present

## 2019-07-14 DIAGNOSIS — Z992 Dependence on renal dialysis: Secondary | ICD-10-CM | POA: Diagnosis not present

## 2019-07-16 DIAGNOSIS — N2581 Secondary hyperparathyroidism of renal origin: Secondary | ICD-10-CM | POA: Diagnosis not present

## 2019-07-16 DIAGNOSIS — Z992 Dependence on renal dialysis: Secondary | ICD-10-CM | POA: Diagnosis not present

## 2019-07-16 DIAGNOSIS — D631 Anemia in chronic kidney disease: Secondary | ICD-10-CM | POA: Diagnosis not present

## 2019-07-16 DIAGNOSIS — N186 End stage renal disease: Secondary | ICD-10-CM | POA: Diagnosis not present

## 2019-07-18 DIAGNOSIS — N2581 Secondary hyperparathyroidism of renal origin: Secondary | ICD-10-CM | POA: Diagnosis not present

## 2019-07-18 DIAGNOSIS — Z992 Dependence on renal dialysis: Secondary | ICD-10-CM | POA: Diagnosis not present

## 2019-07-18 DIAGNOSIS — D631 Anemia in chronic kidney disease: Secondary | ICD-10-CM | POA: Diagnosis not present

## 2019-07-18 DIAGNOSIS — N186 End stage renal disease: Secondary | ICD-10-CM | POA: Diagnosis not present

## 2019-07-21 ENCOUNTER — Other Ambulatory Visit: Payer: Self-pay | Admitting: Internal Medicine

## 2019-07-21 DIAGNOSIS — D631 Anemia in chronic kidney disease: Secondary | ICD-10-CM | POA: Diagnosis not present

## 2019-07-21 DIAGNOSIS — N2581 Secondary hyperparathyroidism of renal origin: Secondary | ICD-10-CM | POA: Diagnosis not present

## 2019-07-21 DIAGNOSIS — N186 End stage renal disease: Secondary | ICD-10-CM | POA: Diagnosis not present

## 2019-07-21 DIAGNOSIS — Z992 Dependence on renal dialysis: Secondary | ICD-10-CM | POA: Diagnosis not present

## 2019-07-23 DIAGNOSIS — Z992 Dependence on renal dialysis: Secondary | ICD-10-CM | POA: Diagnosis not present

## 2019-07-23 DIAGNOSIS — D631 Anemia in chronic kidney disease: Secondary | ICD-10-CM | POA: Diagnosis not present

## 2019-07-23 DIAGNOSIS — N2581 Secondary hyperparathyroidism of renal origin: Secondary | ICD-10-CM | POA: Diagnosis not present

## 2019-07-23 DIAGNOSIS — N186 End stage renal disease: Secondary | ICD-10-CM | POA: Diagnosis not present

## 2019-07-25 DIAGNOSIS — N2581 Secondary hyperparathyroidism of renal origin: Secondary | ICD-10-CM | POA: Diagnosis not present

## 2019-07-25 DIAGNOSIS — N186 End stage renal disease: Secondary | ICD-10-CM | POA: Diagnosis not present

## 2019-07-25 DIAGNOSIS — D631 Anemia in chronic kidney disease: Secondary | ICD-10-CM | POA: Diagnosis not present

## 2019-07-25 DIAGNOSIS — Z992 Dependence on renal dialysis: Secondary | ICD-10-CM | POA: Diagnosis not present

## 2019-07-28 DIAGNOSIS — Z992 Dependence on renal dialysis: Secondary | ICD-10-CM | POA: Diagnosis not present

## 2019-07-28 DIAGNOSIS — N186 End stage renal disease: Secondary | ICD-10-CM | POA: Diagnosis not present

## 2019-07-28 DIAGNOSIS — N2581 Secondary hyperparathyroidism of renal origin: Secondary | ICD-10-CM | POA: Diagnosis not present

## 2019-07-28 DIAGNOSIS — D631 Anemia in chronic kidney disease: Secondary | ICD-10-CM | POA: Diagnosis not present

## 2019-07-30 DIAGNOSIS — D631 Anemia in chronic kidney disease: Secondary | ICD-10-CM | POA: Diagnosis not present

## 2019-07-30 DIAGNOSIS — Z992 Dependence on renal dialysis: Secondary | ICD-10-CM | POA: Diagnosis not present

## 2019-07-30 DIAGNOSIS — N186 End stage renal disease: Secondary | ICD-10-CM | POA: Diagnosis not present

## 2019-07-30 DIAGNOSIS — N2581 Secondary hyperparathyroidism of renal origin: Secondary | ICD-10-CM | POA: Diagnosis not present

## 2019-08-01 DIAGNOSIS — D631 Anemia in chronic kidney disease: Secondary | ICD-10-CM | POA: Diagnosis not present

## 2019-08-01 DIAGNOSIS — N2581 Secondary hyperparathyroidism of renal origin: Secondary | ICD-10-CM | POA: Diagnosis not present

## 2019-08-01 DIAGNOSIS — Z992 Dependence on renal dialysis: Secondary | ICD-10-CM | POA: Diagnosis not present

## 2019-08-01 DIAGNOSIS — N186 End stage renal disease: Secondary | ICD-10-CM | POA: Diagnosis not present

## 2019-08-04 DIAGNOSIS — N186 End stage renal disease: Secondary | ICD-10-CM | POA: Diagnosis not present

## 2019-08-04 DIAGNOSIS — D631 Anemia in chronic kidney disease: Secondary | ICD-10-CM | POA: Diagnosis not present

## 2019-08-04 DIAGNOSIS — N2581 Secondary hyperparathyroidism of renal origin: Secondary | ICD-10-CM | POA: Diagnosis not present

## 2019-08-04 DIAGNOSIS — Z992 Dependence on renal dialysis: Secondary | ICD-10-CM | POA: Diagnosis not present

## 2019-08-05 DIAGNOSIS — H6123 Impacted cerumen, bilateral: Secondary | ICD-10-CM | POA: Diagnosis not present

## 2019-08-05 DIAGNOSIS — H7101 Cholesteatoma of attic, right ear: Secondary | ICD-10-CM | POA: Diagnosis not present

## 2019-08-05 DIAGNOSIS — H903 Sensorineural hearing loss, bilateral: Secondary | ICD-10-CM | POA: Diagnosis not present

## 2019-08-05 DIAGNOSIS — H838X3 Other specified diseases of inner ear, bilateral: Secondary | ICD-10-CM | POA: Diagnosis not present

## 2019-08-06 DIAGNOSIS — N186 End stage renal disease: Secondary | ICD-10-CM | POA: Diagnosis not present

## 2019-08-06 DIAGNOSIS — D631 Anemia in chronic kidney disease: Secondary | ICD-10-CM | POA: Diagnosis not present

## 2019-08-06 DIAGNOSIS — Z992 Dependence on renal dialysis: Secondary | ICD-10-CM | POA: Diagnosis not present

## 2019-08-06 DIAGNOSIS — N2581 Secondary hyperparathyroidism of renal origin: Secondary | ICD-10-CM | POA: Diagnosis not present

## 2019-08-07 DIAGNOSIS — Z992 Dependence on renal dialysis: Secondary | ICD-10-CM | POA: Diagnosis not present

## 2019-08-07 DIAGNOSIS — N186 End stage renal disease: Secondary | ICD-10-CM | POA: Diagnosis not present

## 2019-08-07 DIAGNOSIS — I158 Other secondary hypertension: Secondary | ICD-10-CM | POA: Diagnosis not present

## 2019-08-08 ENCOUNTER — Other Ambulatory Visit: Payer: Self-pay

## 2019-08-08 ENCOUNTER — Emergency Department (HOSPITAL_COMMUNITY)
Admission: EM | Admit: 2019-08-08 | Discharge: 2019-08-08 | Disposition: A | Discharge status: Home / Self Care | Payer: Medicare Other | Attending: Emergency Medicine | Admitting: Emergency Medicine

## 2019-08-08 DIAGNOSIS — N2581 Secondary hyperparathyroidism of renal origin: Secondary | ICD-10-CM | POA: Diagnosis not present

## 2019-08-08 DIAGNOSIS — Z8546 Personal history of malignant neoplasm of prostate: Secondary | ICD-10-CM | POA: Diagnosis not present

## 2019-08-08 DIAGNOSIS — Y92531 Health care provider office as the place of occurrence of the external cause: Secondary | ICD-10-CM | POA: Diagnosis not present

## 2019-08-08 DIAGNOSIS — Z96642 Presence of left artificial hip joint: Secondary | ICD-10-CM | POA: Diagnosis not present

## 2019-08-08 DIAGNOSIS — R58 Hemorrhage, not elsewhere classified: Secondary | ICD-10-CM | POA: Diagnosis not present

## 2019-08-08 DIAGNOSIS — S41112A Laceration without foreign body of left upper arm, initial encounter: Secondary | ICD-10-CM | POA: Diagnosis not present

## 2019-08-08 DIAGNOSIS — Z79899 Other long term (current) drug therapy: Secondary | ICD-10-CM | POA: Insufficient documentation

## 2019-08-08 DIAGNOSIS — Z87891 Personal history of nicotine dependence: Secondary | ICD-10-CM | POA: Diagnosis not present

## 2019-08-08 DIAGNOSIS — I10 Essential (primary) hypertension: Secondary | ICD-10-CM | POA: Diagnosis not present

## 2019-08-08 DIAGNOSIS — I120 Hypertensive chronic kidney disease with stage 5 chronic kidney disease or end stage renal disease: Secondary | ICD-10-CM | POA: Diagnosis not present

## 2019-08-08 DIAGNOSIS — N186 End stage renal disease: Secondary | ICD-10-CM | POA: Insufficient documentation

## 2019-08-08 DIAGNOSIS — Z992 Dependence on renal dialysis: Secondary | ICD-10-CM | POA: Diagnosis not present

## 2019-08-08 DIAGNOSIS — Z7982 Long term (current) use of aspirin: Secondary | ICD-10-CM | POA: Insufficient documentation

## 2019-08-08 DIAGNOSIS — D631 Anemia in chronic kidney disease: Secondary | ICD-10-CM | POA: Diagnosis not present

## 2019-08-08 DIAGNOSIS — Y732 Prosthetic and other implants, materials and accessory gastroenterology and urology devices associated with adverse incidents: Secondary | ICD-10-CM | POA: Diagnosis not present

## 2019-08-08 DIAGNOSIS — X58XXXA Exposure to other specified factors, initial encounter: Secondary | ICD-10-CM | POA: Diagnosis not present

## 2019-08-08 DIAGNOSIS — T82838A Hemorrhage of vascular prosthetic devices, implants and grafts, initial encounter: Secondary | ICD-10-CM | POA: Diagnosis not present

## 2019-08-08 DIAGNOSIS — Y9389 Activity, other specified: Secondary | ICD-10-CM | POA: Diagnosis not present

## 2019-08-08 DIAGNOSIS — Y999 Unspecified external cause status: Secondary | ICD-10-CM | POA: Insufficient documentation

## 2019-08-08 DIAGNOSIS — T8249XA Other complication of vascular dialysis catheter, initial encounter: Secondary | ICD-10-CM | POA: Diagnosis present

## 2019-08-08 MED ORDER — LIDOCAINE-EPINEPHRINE (PF) 2 %-1:200000 IJ SOLN
INTRAMUSCULAR | Status: AC
  Administered 2019-08-08: 13:00:00
  Filled 2019-08-08: qty 20

## 2019-08-08 NOTE — ED Provider Notes (Signed)
[facility] EMERGENCY DEPARTMENT Provider Note   CSN: [id number] Arrival date & time: 08/08/19  1156     History   Chief Complaint Chief Complaint  Patient presents with  . Vascular Access Problem    HPI [name] is a 85 y.o. male.     85 yo M with a chief complaint of bleeding from his left AV fistula.  The patient states that he has been having some after dialysis from that side for about a week or so.  Today the bleeding was much more brisk than it had been.  He was evaluated by nurses at the dialysis center and they placed a clamp and then sent him here.  He thinks that was about an hour ago.  Denies any injury to the area denies blood thinner use.  Denies feeling lightheaded or dizzy.  The history is provided by the patient.  Illness Severity:  Mild Onset quality:  Gradual Duration:  1 hour Timing:  Constant Progression:  Unchanged Chronicity:  Recurrent Associated symptoms: no abdominal pain, no chest pain, no congestion, no diarrhea, no fever, no headaches, no myalgias, no rash, no shortness of breath and no vomiting     Past Medical History:  Diagnosis Date  . ANEMIA-IRON DEFICIENCY 03/09/2008  . Arthritis    cervical spine.   . Complex renal cyst 06/19/2011  . Depression 03/26/2014  . DIVERTICULOSIS, COLON 03/09/2008  . ESRD (end stage renal disease) ([place]) 03/09/2008  . Flu 12/08/2016  . GOUT 03/09/2008  . Hemorrhoids 08/2009   internal.   . History of blood transfusion   . HYPERLIPIDEMIA 03/09/2008  . Hyperparathyroidism ([place])   . HYPERTENSION 03/09/2008  . Prostate cancer [facility]) 2002   Completed external beam radiation 2003.per HPI  . PROSTATE CANCER, HX OF 03/09/2008  . Radiation cystitis 2010.  Marland Kitchen Stroke [facility])     Patient Active Problem List   Diagnosis Date Noted  . Bilateral hearing loss due to cerumen impaction 03/17/2019  . Irregular heart rhythm 10/10/2018  . Left flank pain 01/10/2018  . Influenza A 12/08/2016  . Elevated  troponin 12/08/2016  . Anemia in chronic kidney disease (CKD) 10/04/2016  . Orthostatic hypotension 09/07/2016  . Mass of skin of finger of left hand 10/11/2015  . ESRD on dialysis ([place]) 09/09/2015  . Cerebrovascular accident (CVA) due to thrombosis of right middle cerebral artery ([place]) 09/09/2015  . History of GI bleed   . HLD (hyperlipidemia)   . Benign neoplasm of descending colon   . Gastritis   . Syncope 06/15/2015  . Diverticulosis of colon with hemorrhage   . Low back pain 03/26/2014  . Depression 03/26/2014  . Complex renal cyst 06/19/2011  . Preventative health care 06/17/2011  . SHOULDER PAIN, LEFT 06/03/2010  . Gout 03/09/2008  . Iron deficiency anemia 03/09/2008  . Essential hypertension 03/09/2008  . FATIGUE 03/09/2008  . PROSTATE CANCER, HX OF 03/09/2008    Past Surgical History:  Procedure Laterality Date  . AV FISTULA PLACEMENT Left 01/24/2013   Procedure: INSERTION OF ARTERIOVENOUS (AV) GORE-TEX GRAFT ARM;  Surgeon: Rosetta Posner, MD;  Location: [place];  Service: Vascular;  Laterality: Left;  . COLONOSCOPY N/A 06/17/2015   Procedure: COLONOSCOPY;  Surgeon: Gatha Mayer, MD;  Location: [place];  Service: Endoscopy;  Laterality: N/A;  . ESOPHAGOGASTRODUODENOSCOPY N/A 06/17/2015   Procedure: ESOPHAGOGASTRODUODENOSCOPY (EGD);  Surgeon: Gatha Mayer, MD;  Location[facility] ENDOSCOPY;  Service: Endoscopy;  Laterality: N/A;  . FLEXIBLE SIGMOIDOSCOPY  N/A 06/15/2015   Procedure: FLEXIBLE SIGMOIDOSCOPY;  Surgeon: Gatha Mayer, MD;  [facility] END[facility]COPY;  Service: Endoscopy;  Laterality: N/A;  . GIVENS CAPSULE STUDY N/A 07/06/2015   Procedure: GIVENS CAPSULE STUDY;  Surgeon: Milus Banister, MD;  Location: [place];  Service: Endoscopy;  Laterality: N/A;  . JOINT REPLACEMENT     HIP  . rotator cuff repair right  4/08  . s/p left hip replacement  2007   Dr. Percell Miller ortho  . TONSILLECTOMY          Home Medications    Prior to Admission medications   Medication  Sig Start Date End Date Taking? Authorizing Provider  allopurinol (ZYLOPRIM) 100 MG tablet Take 1 tablet (100 mg total) by mouth daily. 12/30/18   [name], MD  aspirin EC 81 MG tablet Take 1 tablet (81 mg total) by mouth daily. 12/12/16   Rosalin Hawking, MD  AURYXIA 1 GM 210 MG(Fe) tablet TAKE 2 TABLETS BY MOUTH THREE TIMES A DAY WITH MEALS.   SWALLOW WHOLE, DO NOT CHEW OR CRUSH MEDICATION 04/30/18   [provider]  citalopram (CELEXA) 10 MG tablet TAKE 1 TABLET BY MOUTH EVERY DAY 07/21/19   [name], MD  latanoprost (XALATAN) 0.005 % ophthalmic solution INSTILL 1 DROP BY OPHTHALMIC ROUTE EVERY DAY INTO BOTH EYE(S) IN THE EVENING 05/05/18   [provider]  lidocaine-prilocaine (EMLA) cream APPLY SMALL AMOUNT TO ACCESS SITE (AVF) 1 HOUR BEFORE DIALYSIS. COVER WITH OCCLUSIVE DRESSING (SARAN WRAP) 06/07/18   [provider]  midodrine (PROAMATINE) 10 MG tablet Take 1 tablet (10 mg total) by mouth 3 (three) times daily. Take in the morning, lunch time and 4pm. Do not take after 4pm. 06/20/18   Frann Rider, NP  Multiple Vitamin (MULTIVITAMIN WITH MINERALS) TABS tablet Take 1 tablet by mouth daily.    [provider]  nystatin (MYCOSTATIN/NYSTOP) powder Use as directed twice per day Patient taking differently: Apply 1 g topically 2 (two) times daily as needed (rash).  07/06/16   [name], MD  rosuvastatin (CRESTOR) 20 MG tablet Take 1 tablet (20 mg total) by mouth at bedtime. 01/15/19   [name], MD    Family History Family History  Problem Relation Age of Onset  . Hypertension Father   . Diabetes Father   . Cancer Father        prostate cancer  . Hypertension Brother   . Cancer Brother     Social History Social History   Tobacco Use  . Smoking status: Former Smoker    Years: 5.00    Types: Cigarettes    Quit date: 09/17/1982    Years since quitting: 36.9  . Smokeless tobacco: Never Used  Substance Use Topics  . Alcohol use: No     Alcohol/week: 0.0 standard drinks    Comment: rare  . Drug use: No     Allergies   Patient has no known allergies.   Review of Systems Review of Systems  Constitutional: Negative for chills and fever.  HENT: Negative for congestion and facial swelling.   Eyes: Negative for discharge and visual disturbance.  Respiratory: Negative for shortness of breath.   Cardiovascular: Negative for chest pain and palpitations.  Gastrointestinal: Negative for abdominal pain, diarrhea and vomiting.  Musculoskeletal: Negative for arthralgias and myalgias.  Skin: Positive for wound. Negative for color change and rash.  Neurological: Negative for tremors, syncope and headaches.  Psychiatric/Behavioral: Negative for confusion and dysphoric mood.  Physical Exam Updated Vital Signs BP (!) 164/74   Pulse 71   Temp (!) 97.4 F (36.3 C) (Oral)   Resp 16   SpO2 99%   Physical Exam Vitals signs and nursing note reviewed.  Constitutional:      Appearance: He is well-developed.  HENT:     Head: Normocephalic and atraumatic.  Eyes:     Pupils: Pupils are equal, round, and reactive to light.  Neck:     Musculoskeletal: Normal range of motion and neck supple.     Vascular: No JVD.  Cardiovascular:     Rate and Rhythm: Normal rate and regular rhythm.     Heart sounds: No murmur. No friction rub. No gallop.   Pulmonary:     Effort: No respiratory distress.     Breath sounds: No wheezing.  Abdominal:     General: There is no distension.     Tenderness: There is no abdominal tenderness. There is no guarding or rebound.  Musculoskeletal: Normal range of motion.     Comments: Pulsatile bleeding from L av fistula.   Skin:    Coloration: Skin is not pale.     Findings: No rash.  Neurological:     Mental Status: He is alert and oriented to person, place, and time.  Psychiatric:        Behavior: Behavior normal.      ED Treatments / Results  Labs (all labs ordered are listed, but only  abnormal results are displayed) Labs Reviewed - No data to display  EKG None  Radiology No results found.  Procedures .Marland KitchenLaceration Repair  Date/Time: 08/08/2019 1:17 PM Performed by: Deno Etienne, DO Authorized by: Deno Etienne, DO   Consent:    Consent obtained:  Verbal   Consent given by:  Patient   Risks discussed:  Pain   Alternatives discussed:  No treatment, delayed treatment and observation Anesthesia (see MAR for exact dosages):    Anesthesia method:  Local infiltration   Local anesthetic:  Lidocaine 1% WITH epi Laceration details:    Location:  Shoulder/arm   Shoulder/arm location:  L upper arm Repair type:    Repair type:  Simple Pre-procedure details:    Preparation:  Patient was prepped and draped in usual sterile fashion Exploration:    Hemostasis achieved with:  Direct pressure and epinephrine (figure of 8 stitch)   Wound extent: vascular damage (fistula bleeding)     Contaminated: no   Treatment:    Wound cleansed with: Chlorhexidine.   Amount of cleaning:  Standard Skin repair:    Repair method:  Sutures   Suture size:  3-0   Suture material:  Nylon   Suture technique:  Figure eight   Number of sutures:  1 Approximation:    Approximation:  Close Post-procedure details:    Dressing:  Open (no dressing)   Patient tolerance of procedure:  Tolerated well, no immediate complications   (including critical care time)  Medications Ordered in ED Medications  lidocaine-EPINEPHrine (XYLOCAINE W/EPI) 2 %-1:200000 (PF) injection (  Given by Other 08/08/19 1236)     Initial Impression / Assessment and Plan / ED Course  I have reviewed the triage vital signs and the nursing notes.  Pertinent labs & imaging results that were available during my care of the patient were reviewed by me and considered in my medical decision making (see chart for details).        85 yo M with a chief complaints of bleeding through his  left AV fistula site.  He has been having  some oozing over the past week but today the bleeding was significantly worse.  The patient's bleeding was pulsatile.  Pressure was placed on the area for 15 minutes with no significant improvement.  Has this was pulsatile I have made the decision to suture the puncture site closed.  I used a figure-of-eight stitch which stopped the bleeding.  I also applied a quick clot impregnated dressing on top of the wound and closely wrapped it with Kerlix.  We will have the patient keep the bandage in place until he has dialysis in 2 days time.  1:41 PM:  I have discussed the diagnosis/risks/treatment options with the patient and family and believe the pt to be eligible for discharge home to follow-up with PCP, nephrology. We also discussed returning to the ED immediately if new or worsening sx occur. We discussed the sx which are most concerning (e.g., sudden worsening pain, fever, inability to tolerate by mouth) that necessitate immediate return. Medications administered to the patient during their visit and any new prescriptions provided to the patient are listed below.  Medications given during this visit Medications  lidocaine-EPINEPHrine (XYLOCAINE W/EPI) 2 %-1:200000 (PF) injection (  Given by Other 08/08/19 1236)     The patient appears reasonably screen and/or stabilized for discharge and I doubt any other medical condition or other [facility] requiring further screening, evaluation, or treatment in the ED at this time prior to discharge.    Final Clinical Impressions(s) / ED Diagnoses   Final diagnoses:  Bleeding from dialysis shunt, initial encounter [facility])    ED Discharge Orders    None       Deno Etienne, DO 08/08/19 1341

## 2019-08-08 NOTE — ED Triage Notes (Signed)
Pt here from dialysis, where he completed his whole tx today but his fistula did not stop bleeding for an hour after completion of tx. Pt denies pain. Arrives with clamp in place on L forearm, no bleeding through the dressing.

## 2019-08-08 NOTE — Discharge Instructions (Addendum)
Keep this dressing on the wound until you see the dialysis center in 2 days time.  Please return if you bleed through this dressing.

## 2019-08-11 DIAGNOSIS — N2581 Secondary hyperparathyroidism of renal origin: Secondary | ICD-10-CM | POA: Diagnosis not present

## 2019-08-11 DIAGNOSIS — D631 Anemia in chronic kidney disease: Secondary | ICD-10-CM | POA: Diagnosis not present

## 2019-08-11 DIAGNOSIS — N186 End stage renal disease: Secondary | ICD-10-CM | POA: Diagnosis not present

## 2019-08-11 DIAGNOSIS — Z992 Dependence on renal dialysis: Secondary | ICD-10-CM | POA: Diagnosis not present

## 2019-08-12 ENCOUNTER — Other Ambulatory Visit: Payer: Self-pay | Admitting: Otolaryngology

## 2019-08-12 DIAGNOSIS — H9011 Conductive hearing loss, unilateral, right ear, with unrestricted hearing on the contralateral side: Secondary | ICD-10-CM

## 2019-08-13 DIAGNOSIS — N186 End stage renal disease: Secondary | ICD-10-CM | POA: Diagnosis not present

## 2019-08-13 DIAGNOSIS — D631 Anemia in chronic kidney disease: Secondary | ICD-10-CM | POA: Diagnosis not present

## 2019-08-13 DIAGNOSIS — N2581 Secondary hyperparathyroidism of renal origin: Secondary | ICD-10-CM | POA: Diagnosis not present

## 2019-08-13 DIAGNOSIS — Z992 Dependence on renal dialysis: Secondary | ICD-10-CM | POA: Diagnosis not present

## 2019-08-15 DIAGNOSIS — N186 End stage renal disease: Secondary | ICD-10-CM | POA: Diagnosis not present

## 2019-08-15 DIAGNOSIS — N2581 Secondary hyperparathyroidism of renal origin: Secondary | ICD-10-CM | POA: Diagnosis not present

## 2019-08-15 DIAGNOSIS — D631 Anemia in chronic kidney disease: Secondary | ICD-10-CM | POA: Diagnosis not present

## 2019-08-15 DIAGNOSIS — Z992 Dependence on renal dialysis: Secondary | ICD-10-CM | POA: Diagnosis not present

## 2019-08-18 DIAGNOSIS — D631 Anemia in chronic kidney disease: Secondary | ICD-10-CM | POA: Diagnosis not present

## 2019-08-18 DIAGNOSIS — N2581 Secondary hyperparathyroidism of renal origin: Secondary | ICD-10-CM | POA: Diagnosis not present

## 2019-08-18 DIAGNOSIS — Z992 Dependence on renal dialysis: Secondary | ICD-10-CM | POA: Diagnosis not present

## 2019-08-18 DIAGNOSIS — N186 End stage renal disease: Secondary | ICD-10-CM | POA: Diagnosis not present

## 2019-08-19 ENCOUNTER — Ambulatory Visit
Admission: RE | Admit: 2019-08-19 | Discharge: 2019-08-19 | Disposition: A | Discharge status: Home / Self Care | Payer: Medicare Other | Source: Ambulatory Visit | Attending: Otolaryngology | Admitting: Otolaryngology

## 2019-08-19 ENCOUNTER — Other Ambulatory Visit: Payer: Self-pay | Admitting: Nephrology

## 2019-08-19 DIAGNOSIS — N186 End stage renal disease: Secondary | ICD-10-CM

## 2019-08-19 DIAGNOSIS — H93291 Other abnormal auditory perceptions, right ear: Secondary | ICD-10-CM | POA: Diagnosis not present

## 2019-08-19 DIAGNOSIS — Z8546 Personal history of malignant neoplasm of prostate: Secondary | ICD-10-CM

## 2019-08-19 DIAGNOSIS — H9011 Conductive hearing loss, unilateral, right ear, with unrestricted hearing on the contralateral side: Secondary | ICD-10-CM

## 2019-08-20 DIAGNOSIS — N186 End stage renal disease: Secondary | ICD-10-CM | POA: Diagnosis not present

## 2019-08-20 DIAGNOSIS — Z992 Dependence on renal dialysis: Secondary | ICD-10-CM | POA: Diagnosis not present

## 2019-08-20 DIAGNOSIS — N2581 Secondary hyperparathyroidism of renal origin: Secondary | ICD-10-CM | POA: Diagnosis not present

## 2019-08-20 DIAGNOSIS — D631 Anemia in chronic kidney disease: Secondary | ICD-10-CM | POA: Diagnosis not present

## 2019-08-22 ENCOUNTER — Other Ambulatory Visit: Payer: Medicare Other

## 2019-08-22 DIAGNOSIS — N2581 Secondary hyperparathyroidism of renal origin: Secondary | ICD-10-CM | POA: Diagnosis not present

## 2019-08-22 DIAGNOSIS — N186 End stage renal disease: Secondary | ICD-10-CM | POA: Diagnosis not present

## 2019-08-22 DIAGNOSIS — D631 Anemia in chronic kidney disease: Secondary | ICD-10-CM | POA: Diagnosis not present

## 2019-08-22 DIAGNOSIS — Z992 Dependence on renal dialysis: Secondary | ICD-10-CM | POA: Diagnosis not present

## 2019-08-25 DIAGNOSIS — D631 Anemia in chronic kidney disease: Secondary | ICD-10-CM | POA: Diagnosis not present

## 2019-08-25 DIAGNOSIS — N186 End stage renal disease: Secondary | ICD-10-CM | POA: Diagnosis not present

## 2019-08-25 DIAGNOSIS — N2581 Secondary hyperparathyroidism of renal origin: Secondary | ICD-10-CM | POA: Diagnosis not present

## 2019-08-25 DIAGNOSIS — Z992 Dependence on renal dialysis: Secondary | ICD-10-CM | POA: Diagnosis not present

## 2019-08-26 ENCOUNTER — Other Ambulatory Visit: Payer: Medicare Other

## 2019-08-27 DIAGNOSIS — Z992 Dependence on renal dialysis: Secondary | ICD-10-CM | POA: Diagnosis not present

## 2019-08-27 DIAGNOSIS — N2581 Secondary hyperparathyroidism of renal origin: Secondary | ICD-10-CM | POA: Diagnosis not present

## 2019-08-27 DIAGNOSIS — D631 Anemia in chronic kidney disease: Secondary | ICD-10-CM | POA: Diagnosis not present

## 2019-08-27 DIAGNOSIS — N186 End stage renal disease: Secondary | ICD-10-CM | POA: Diagnosis not present

## 2019-08-29 DIAGNOSIS — D631 Anemia in chronic kidney disease: Secondary | ICD-10-CM | POA: Diagnosis not present

## 2019-08-29 DIAGNOSIS — N2581 Secondary hyperparathyroidism of renal origin: Secondary | ICD-10-CM | POA: Diagnosis not present

## 2019-08-29 DIAGNOSIS — Z992 Dependence on renal dialysis: Secondary | ICD-10-CM | POA: Diagnosis not present

## 2019-08-29 DIAGNOSIS — N186 End stage renal disease: Secondary | ICD-10-CM | POA: Diagnosis not present

## 2019-09-01 DIAGNOSIS — N186 End stage renal disease: Secondary | ICD-10-CM | POA: Diagnosis not present

## 2019-09-01 DIAGNOSIS — D631 Anemia in chronic kidney disease: Secondary | ICD-10-CM | POA: Diagnosis not present

## 2019-09-01 DIAGNOSIS — Z992 Dependence on renal dialysis: Secondary | ICD-10-CM | POA: Diagnosis not present

## 2019-09-01 DIAGNOSIS — N2581 Secondary hyperparathyroidism of renal origin: Secondary | ICD-10-CM | POA: Diagnosis not present

## 2019-09-02 ENCOUNTER — Ambulatory Visit
Admission: RE | Admit: 2019-09-02 | Discharge: 2019-09-02 | Disposition: A | Discharge status: Home / Self Care | Payer: Medicare Other | Source: Ambulatory Visit | Attending: Nephrology | Admitting: Nephrology

## 2019-09-02 DIAGNOSIS — Z8546 Personal history of malignant neoplasm of prostate: Secondary | ICD-10-CM

## 2019-09-02 DIAGNOSIS — N281 Cyst of kidney, acquired: Secondary | ICD-10-CM | POA: Diagnosis not present

## 2019-09-02 DIAGNOSIS — N186 End stage renal disease: Secondary | ICD-10-CM

## 2019-09-03 DIAGNOSIS — D631 Anemia in chronic kidney disease: Secondary | ICD-10-CM | POA: Diagnosis not present

## 2019-09-03 DIAGNOSIS — N186 End stage renal disease: Secondary | ICD-10-CM | POA: Diagnosis not present

## 2019-09-03 DIAGNOSIS — Z992 Dependence on renal dialysis: Secondary | ICD-10-CM | POA: Diagnosis not present

## 2019-09-03 DIAGNOSIS — N2581 Secondary hyperparathyroidism of renal origin: Secondary | ICD-10-CM | POA: Diagnosis not present

## 2019-09-05 DIAGNOSIS — D631 Anemia in chronic kidney disease: Secondary | ICD-10-CM | POA: Diagnosis not present

## 2019-09-05 DIAGNOSIS — Z992 Dependence on renal dialysis: Secondary | ICD-10-CM | POA: Diagnosis not present

## 2019-09-05 DIAGNOSIS — N2581 Secondary hyperparathyroidism of renal origin: Secondary | ICD-10-CM | POA: Diagnosis not present

## 2019-09-05 DIAGNOSIS — N186 End stage renal disease: Secondary | ICD-10-CM | POA: Diagnosis not present

## 2019-09-07 DIAGNOSIS — I158 Other secondary hypertension: Secondary | ICD-10-CM | POA: Diagnosis not present

## 2019-09-07 DIAGNOSIS — Z992 Dependence on renal dialysis: Secondary | ICD-10-CM | POA: Diagnosis not present

## 2019-09-07 DIAGNOSIS — N186 End stage renal disease: Secondary | ICD-10-CM | POA: Diagnosis not present

## 2019-09-08 DIAGNOSIS — N2581 Secondary hyperparathyroidism of renal origin: Secondary | ICD-10-CM | POA: Diagnosis not present

## 2019-09-08 DIAGNOSIS — Z992 Dependence on renal dialysis: Secondary | ICD-10-CM | POA: Diagnosis not present

## 2019-09-08 DIAGNOSIS — D631 Anemia in chronic kidney disease: Secondary | ICD-10-CM | POA: Diagnosis not present

## 2019-09-08 DIAGNOSIS — N186 End stage renal disease: Secondary | ICD-10-CM | POA: Diagnosis not present

## 2019-09-10 DIAGNOSIS — Z992 Dependence on renal dialysis: Secondary | ICD-10-CM | POA: Diagnosis not present

## 2019-09-10 DIAGNOSIS — N186 End stage renal disease: Secondary | ICD-10-CM | POA: Diagnosis not present

## 2019-09-10 DIAGNOSIS — N2581 Secondary hyperparathyroidism of renal origin: Secondary | ICD-10-CM | POA: Diagnosis not present

## 2019-09-10 DIAGNOSIS — C61 Malignant neoplasm of prostate: Secondary | ICD-10-CM | POA: Diagnosis not present

## 2019-09-10 DIAGNOSIS — D631 Anemia in chronic kidney disease: Secondary | ICD-10-CM | POA: Diagnosis not present

## 2019-09-12 DIAGNOSIS — D631 Anemia in chronic kidney disease: Secondary | ICD-10-CM | POA: Diagnosis not present

## 2019-09-12 DIAGNOSIS — N2581 Secondary hyperparathyroidism of renal origin: Secondary | ICD-10-CM | POA: Diagnosis not present

## 2019-09-12 DIAGNOSIS — Z992 Dependence on renal dialysis: Secondary | ICD-10-CM | POA: Diagnosis not present

## 2019-09-12 DIAGNOSIS — N186 End stage renal disease: Secondary | ICD-10-CM | POA: Diagnosis not present

## 2019-09-15 DIAGNOSIS — D631 Anemia in chronic kidney disease: Secondary | ICD-10-CM | POA: Diagnosis not present

## 2019-09-15 DIAGNOSIS — N186 End stage renal disease: Secondary | ICD-10-CM | POA: Diagnosis not present

## 2019-09-15 DIAGNOSIS — Z992 Dependence on renal dialysis: Secondary | ICD-10-CM | POA: Diagnosis not present

## 2019-09-15 DIAGNOSIS — N2581 Secondary hyperparathyroidism of renal origin: Secondary | ICD-10-CM | POA: Diagnosis not present

## 2019-09-17 DIAGNOSIS — N2581 Secondary hyperparathyroidism of renal origin: Secondary | ICD-10-CM | POA: Diagnosis not present

## 2019-09-17 DIAGNOSIS — D631 Anemia in chronic kidney disease: Secondary | ICD-10-CM | POA: Diagnosis not present

## 2019-09-17 DIAGNOSIS — N186 End stage renal disease: Secondary | ICD-10-CM | POA: Diagnosis not present

## 2019-09-17 DIAGNOSIS — Z992 Dependence on renal dialysis: Secondary | ICD-10-CM | POA: Diagnosis not present

## 2019-09-19 DIAGNOSIS — Z992 Dependence on renal dialysis: Secondary | ICD-10-CM | POA: Diagnosis not present

## 2019-09-19 DIAGNOSIS — N186 End stage renal disease: Secondary | ICD-10-CM | POA: Diagnosis not present

## 2019-09-19 DIAGNOSIS — D631 Anemia in chronic kidney disease: Secondary | ICD-10-CM | POA: Diagnosis not present

## 2019-09-19 DIAGNOSIS — N2581 Secondary hyperparathyroidism of renal origin: Secondary | ICD-10-CM | POA: Diagnosis not present

## 2019-09-22 DIAGNOSIS — N2581 Secondary hyperparathyroidism of renal origin: Secondary | ICD-10-CM | POA: Diagnosis not present

## 2019-09-22 DIAGNOSIS — N186 End stage renal disease: Secondary | ICD-10-CM | POA: Diagnosis not present

## 2019-09-22 DIAGNOSIS — Z992 Dependence on renal dialysis: Secondary | ICD-10-CM | POA: Diagnosis not present

## 2019-09-22 DIAGNOSIS — D631 Anemia in chronic kidney disease: Secondary | ICD-10-CM | POA: Diagnosis not present

## 2019-09-24 DIAGNOSIS — D631 Anemia in chronic kidney disease: Secondary | ICD-10-CM | POA: Diagnosis not present

## 2019-09-24 DIAGNOSIS — N186 End stage renal disease: Secondary | ICD-10-CM | POA: Diagnosis not present

## 2019-09-24 DIAGNOSIS — N2581 Secondary hyperparathyroidism of renal origin: Secondary | ICD-10-CM | POA: Diagnosis not present

## 2019-09-24 DIAGNOSIS — Z992 Dependence on renal dialysis: Secondary | ICD-10-CM | POA: Diagnosis not present

## 2019-09-26 DIAGNOSIS — D631 Anemia in chronic kidney disease: Secondary | ICD-10-CM | POA: Diagnosis not present

## 2019-09-26 DIAGNOSIS — N186 End stage renal disease: Secondary | ICD-10-CM | POA: Diagnosis not present

## 2019-09-26 DIAGNOSIS — N2581 Secondary hyperparathyroidism of renal origin: Secondary | ICD-10-CM | POA: Diagnosis not present

## 2019-09-26 DIAGNOSIS — Z992 Dependence on renal dialysis: Secondary | ICD-10-CM | POA: Diagnosis not present

## 2019-09-28 DIAGNOSIS — Z992 Dependence on renal dialysis: Secondary | ICD-10-CM | POA: Diagnosis not present

## 2019-09-28 DIAGNOSIS — N186 End stage renal disease: Secondary | ICD-10-CM | POA: Diagnosis not present

## 2019-09-28 DIAGNOSIS — N2581 Secondary hyperparathyroidism of renal origin: Secondary | ICD-10-CM | POA: Diagnosis not present

## 2019-09-28 DIAGNOSIS — D631 Anemia in chronic kidney disease: Secondary | ICD-10-CM | POA: Diagnosis not present

## 2019-09-30 DIAGNOSIS — Z992 Dependence on renal dialysis: Secondary | ICD-10-CM | POA: Diagnosis not present

## 2019-09-30 DIAGNOSIS — D631 Anemia in chronic kidney disease: Secondary | ICD-10-CM | POA: Diagnosis not present

## 2019-09-30 DIAGNOSIS — N2581 Secondary hyperparathyroidism of renal origin: Secondary | ICD-10-CM | POA: Diagnosis not present

## 2019-09-30 DIAGNOSIS — N186 End stage renal disease: Secondary | ICD-10-CM | POA: Diagnosis not present

## 2019-10-03 DIAGNOSIS — N186 End stage renal disease: Secondary | ICD-10-CM | POA: Diagnosis not present

## 2019-10-03 DIAGNOSIS — N2581 Secondary hyperparathyroidism of renal origin: Secondary | ICD-10-CM | POA: Diagnosis not present

## 2019-10-03 DIAGNOSIS — D631 Anemia in chronic kidney disease: Secondary | ICD-10-CM | POA: Diagnosis not present

## 2019-10-03 DIAGNOSIS — Z992 Dependence on renal dialysis: Secondary | ICD-10-CM | POA: Diagnosis not present

## 2019-10-06 DIAGNOSIS — D631 Anemia in chronic kidney disease: Secondary | ICD-10-CM | POA: Diagnosis not present

## 2019-10-06 DIAGNOSIS — N2581 Secondary hyperparathyroidism of renal origin: Secondary | ICD-10-CM | POA: Diagnosis not present

## 2019-10-06 DIAGNOSIS — N186 End stage renal disease: Secondary | ICD-10-CM | POA: Diagnosis not present

## 2019-10-06 DIAGNOSIS — Z992 Dependence on renal dialysis: Secondary | ICD-10-CM | POA: Diagnosis not present

## 2019-10-14 ENCOUNTER — Ambulatory Visit: Payer: Medicare Other | Admitting: Internal Medicine

## 2019-11-07 DIAGNOSIS — N186 End stage renal disease: Secondary | ICD-10-CM | POA: Diagnosis not present

## 2019-11-07 DIAGNOSIS — Z992 Dependence on renal dialysis: Secondary | ICD-10-CM | POA: Diagnosis not present

## 2019-11-07 DIAGNOSIS — I158 Other secondary hypertension: Secondary | ICD-10-CM | POA: Diagnosis not present

## 2019-11-08 DIAGNOSIS — Z992 Dependence on renal dialysis: Secondary | ICD-10-CM | POA: Diagnosis not present

## 2019-11-08 DIAGNOSIS — N186 End stage renal disease: Secondary | ICD-10-CM | POA: Diagnosis not present

## 2019-11-08 DIAGNOSIS — D631 Anemia in chronic kidney disease: Secondary | ICD-10-CM | POA: Diagnosis not present

## 2019-11-08 DIAGNOSIS — N2581 Secondary hyperparathyroidism of renal origin: Secondary | ICD-10-CM | POA: Diagnosis not present

## 2019-11-10 DIAGNOSIS — N2581 Secondary hyperparathyroidism of renal origin: Secondary | ICD-10-CM | POA: Diagnosis not present

## 2019-11-10 DIAGNOSIS — D631 Anemia in chronic kidney disease: Secondary | ICD-10-CM | POA: Diagnosis not present

## 2019-11-10 DIAGNOSIS — D3502 Benign neoplasm of left adrenal gland: Secondary | ICD-10-CM | POA: Diagnosis not present

## 2019-11-10 DIAGNOSIS — N186 End stage renal disease: Secondary | ICD-10-CM | POA: Diagnosis not present

## 2019-11-10 DIAGNOSIS — Z992 Dependence on renal dialysis: Secondary | ICD-10-CM | POA: Diagnosis not present

## 2019-11-12 DIAGNOSIS — N2581 Secondary hyperparathyroidism of renal origin: Secondary | ICD-10-CM | POA: Diagnosis not present

## 2019-11-12 DIAGNOSIS — N186 End stage renal disease: Secondary | ICD-10-CM | POA: Diagnosis not present

## 2019-11-12 DIAGNOSIS — Z992 Dependence on renal dialysis: Secondary | ICD-10-CM | POA: Diagnosis not present

## 2019-11-12 DIAGNOSIS — D631 Anemia in chronic kidney disease: Secondary | ICD-10-CM | POA: Diagnosis not present

## 2019-11-14 DIAGNOSIS — Z992 Dependence on renal dialysis: Secondary | ICD-10-CM | POA: Diagnosis not present

## 2019-11-14 DIAGNOSIS — N2581 Secondary hyperparathyroidism of renal origin: Secondary | ICD-10-CM | POA: Diagnosis not present

## 2019-11-14 DIAGNOSIS — D631 Anemia in chronic kidney disease: Secondary | ICD-10-CM | POA: Diagnosis not present

## 2019-11-14 DIAGNOSIS — N186 End stage renal disease: Secondary | ICD-10-CM | POA: Diagnosis not present

## 2019-11-17 DIAGNOSIS — Z992 Dependence on renal dialysis: Secondary | ICD-10-CM | POA: Diagnosis not present

## 2019-11-17 DIAGNOSIS — N186 End stage renal disease: Secondary | ICD-10-CM | POA: Diagnosis not present

## 2019-11-17 DIAGNOSIS — D631 Anemia in chronic kidney disease: Secondary | ICD-10-CM | POA: Diagnosis not present

## 2019-11-17 DIAGNOSIS — N2581 Secondary hyperparathyroidism of renal origin: Secondary | ICD-10-CM | POA: Diagnosis not present

## 2019-11-19 DIAGNOSIS — N186 End stage renal disease: Secondary | ICD-10-CM | POA: Diagnosis not present

## 2019-11-19 DIAGNOSIS — D631 Anemia in chronic kidney disease: Secondary | ICD-10-CM | POA: Diagnosis not present

## 2019-11-19 DIAGNOSIS — N2581 Secondary hyperparathyroidism of renal origin: Secondary | ICD-10-CM | POA: Diagnosis not present

## 2019-11-19 DIAGNOSIS — Z992 Dependence on renal dialysis: Secondary | ICD-10-CM | POA: Diagnosis not present

## 2019-11-21 DIAGNOSIS — Z992 Dependence on renal dialysis: Secondary | ICD-10-CM | POA: Diagnosis not present

## 2019-11-21 DIAGNOSIS — N2581 Secondary hyperparathyroidism of renal origin: Secondary | ICD-10-CM | POA: Diagnosis not present

## 2019-11-21 DIAGNOSIS — N186 End stage renal disease: Secondary | ICD-10-CM | POA: Diagnosis not present

## 2019-11-21 DIAGNOSIS — D631 Anemia in chronic kidney disease: Secondary | ICD-10-CM | POA: Diagnosis not present

## 2019-11-24 DIAGNOSIS — N2581 Secondary hyperparathyroidism of renal origin: Secondary | ICD-10-CM | POA: Diagnosis not present

## 2019-11-24 DIAGNOSIS — N186 End stage renal disease: Secondary | ICD-10-CM | POA: Diagnosis not present

## 2019-11-24 DIAGNOSIS — D631 Anemia in chronic kidney disease: Secondary | ICD-10-CM | POA: Diagnosis not present

## 2019-11-24 DIAGNOSIS — Z992 Dependence on renal dialysis: Secondary | ICD-10-CM | POA: Diagnosis not present

## 2019-11-26 DIAGNOSIS — Z992 Dependence on renal dialysis: Secondary | ICD-10-CM | POA: Diagnosis not present

## 2019-11-26 DIAGNOSIS — D631 Anemia in chronic kidney disease: Secondary | ICD-10-CM | POA: Diagnosis not present

## 2019-11-26 DIAGNOSIS — N186 End stage renal disease: Secondary | ICD-10-CM | POA: Diagnosis not present

## 2019-11-26 DIAGNOSIS — N2581 Secondary hyperparathyroidism of renal origin: Secondary | ICD-10-CM | POA: Diagnosis not present

## 2019-11-27 ENCOUNTER — Other Ambulatory Visit: Payer: Self-pay

## 2019-11-27 ENCOUNTER — Ambulatory Visit: Payer: Medicare Other | Admitting: Internal Medicine

## 2019-11-27 ENCOUNTER — Emergency Department (HOSPITAL_COMMUNITY)
Admission: EM | Admit: 2019-11-27 | Discharge: 2019-11-27 | Disposition: A | Discharge status: Home / Self Care | Payer: Medicare Other | Attending: Emergency Medicine | Admitting: Emergency Medicine

## 2019-11-27 DIAGNOSIS — T82838A Hemorrhage of vascular prosthetic devices, implants and grafts, initial encounter: Secondary | ICD-10-CM | POA: Diagnosis not present

## 2019-11-27 DIAGNOSIS — Z992 Dependence on renal dialysis: Secondary | ICD-10-CM | POA: Diagnosis not present

## 2019-11-27 DIAGNOSIS — Y712 Prosthetic and other implants, materials and accessory cardiovascular devices associated with adverse incidents: Secondary | ICD-10-CM | POA: Insufficient documentation

## 2019-11-27 DIAGNOSIS — I120 Hypertensive chronic kidney disease with stage 5 chronic kidney disease or end stage renal disease: Secondary | ICD-10-CM | POA: Diagnosis not present

## 2019-11-27 DIAGNOSIS — R0689 Other abnormalities of breathing: Secondary | ICD-10-CM | POA: Diagnosis not present

## 2019-11-27 DIAGNOSIS — Z8546 Personal history of malignant neoplasm of prostate: Secondary | ICD-10-CM | POA: Insufficient documentation

## 2019-11-27 DIAGNOSIS — R404 Transient alteration of awareness: Secondary | ICD-10-CM | POA: Diagnosis not present

## 2019-11-27 DIAGNOSIS — R4020 Unspecified coma: Secondary | ICD-10-CM | POA: Diagnosis not present

## 2019-11-27 DIAGNOSIS — Z7982 Long term (current) use of aspirin: Secondary | ICD-10-CM | POA: Insufficient documentation

## 2019-11-27 DIAGNOSIS — N186 End stage renal disease: Secondary | ICD-10-CM | POA: Insufficient documentation

## 2019-11-27 DIAGNOSIS — Z79899 Other long term (current) drug therapy: Secondary | ICD-10-CM | POA: Insufficient documentation

## 2019-11-27 DIAGNOSIS — I959 Hypotension, unspecified: Secondary | ICD-10-CM | POA: Diagnosis not present

## 2019-11-27 DIAGNOSIS — R55 Syncope and collapse: Secondary | ICD-10-CM | POA: Diagnosis not present

## 2019-11-27 LAB — I-STAT CHEM 8, ED
BUN: 17 mg/dL (ref 8–23)
Calcium, Ion: 0.97 mmol/L — ABNORMAL LOW (ref 1.15–1.40)
Chloride: 95 mmol/L — ABNORMAL LOW (ref 98–111)
Creatinine, Ser: 6.3 mg/dL — ABNORMAL HIGH (ref 0.61–1.24)
Glucose, Bld: 156 mg/dL — ABNORMAL HIGH (ref 70–99)
HCT: 30 % — ABNORMAL LOW (ref 39.0–52.0)
Hemoglobin: 10.2 g/dL — ABNORMAL LOW (ref 13.0–17.0)
Potassium: 4.6 mmol/L (ref 3.5–5.1)
Sodium: 135 mmol/L (ref 135–145)
TCO2: 30 mmol/L (ref 22–32)

## 2019-11-27 LAB — CBC
HCT: 30.4 % — ABNORMAL LOW (ref 39.0–52.0)
Hemoglobin: 9.3 g/dL — ABNORMAL LOW (ref 13.0–17.0)
MCH: 31.3 pg (ref 26.0–34.0)
MCHC: 30.6 g/dL (ref 30.0–36.0)
MCV: 102.4 fL — ABNORMAL HIGH (ref 80.0–100.0)
Platelets: 246 10*3/uL (ref 150–400)
RBC: 2.97 MIL/uL — ABNORMAL LOW (ref 4.22–5.81)
RDW: 17.4 % — ABNORMAL HIGH (ref 11.5–15.5)
WBC: 9.8 10*3/uL (ref 4.0–10.5)
nRBC: 0 % (ref 0.0–0.2)

## 2019-11-27 LAB — CBG MONITORING, ED: Glucose-Capillary: 145 mg/dL — ABNORMAL HIGH (ref 70–99)

## 2019-11-27 LAB — PROTIME-INR
INR: 1.1 (ref 0.8–1.2)
Prothrombin Time: 14.5 seconds (ref 11.4–15.2)

## 2019-11-27 MED ORDER — LIDOCAINE-EPINEPHRINE 2 %-1:100000 IJ SOLN
20.0000 mL | Freq: Once | INTRAMUSCULAR | Status: DC
  Filled 2019-11-27: qty 20

## 2019-11-27 NOTE — ED Triage Notes (Signed)
Pt here from home after his L arm dialysis fistula started bleeding this morning, spontaneously, no injury. Pt lost 400-600 ml blood, had syncopal episode and fell, starting to come around on EMS arrival. Pt does not remember any events of this morning, confused. 200 NS bolus given PTA.  Denies pain, dizziness, or lightheadedness. Had full dialysis tx yesterday without complication.

## 2019-11-27 NOTE — ED Provider Notes (Signed)
Medical screening examination/treatment/procedure(s) were conducted as a shared visit with non-physician practitioner(s) and myself.  I personally evaluated the patient during the encounter.    Patient removed his dressing from his dialysis fistula this morning and had profuse bleeding.  He also had a near syncopal episode.  Patient is alert and interactive.  He is in no acute distress.  He does not have active bleeding at this time.  See attached image.  I agree with plan and management.       Charlesetta Shanks, MD 11/27/19 1101

## 2019-11-27 NOTE — ED Provider Notes (Signed)
[facility] EMERGENCY DEPARTMENT Provider Note   CSN: [id number] Arrival date & time: 11/27/19  1003     History Chief Complaint  Patient presents with  . Vascular Access Problem    [name] is a 86 y.o. male.  The history is provided by the patient, the spouse and the EMS personnel. No language interpreter was used.     86 year old male with history of end-stage renal disease currently receiving dialysis through his left AV fistula presenting with a bleeding graft.  History obtained through patient's wife, and son via the phone.  Pt has dialysis on Monday Wednesday Friday.  Last dialysis session was yesterday which he received a full session.  A dressing was placed over the AV fistula site.  This morning, while in the bathroom, patient removed the dressing subsequently it started to bleed.  His son and his wife are said to help control the bleeding and during that time patient syncopized, was able to guide down to the floor without any significant injury according to his wife.  But continues to oozing a steady stream, moderate amount and thus EMS was called.  Patient became more alert once EMS arrived.  The estimated blood loss of approximately 400 to 600 mL.  Dressing was placed and patient brought here.  At this time patient denies having any active pain dizziness lightheadedness but appears to be confused and could not recall exactly what happened.  He denies any headache, neck pain, chest pain, trouble breathing, abdominal pain, focal numbness or focal weakness.  He has had blood transfusion in the past.  He does have history of iron deficiency anemia.  History is limited from patient.    Past Medical History:  Diagnosis Date  . ANEMIA-IRON DEFICIENCY 03/09/2008  . Arthritis    cervical spine.   . Complex renal cyst 06/19/2011  . Depression 03/26/2014  . DIVERTICULOSIS, COLON 03/09/2008  . ESRD (end stage renal disease) ([place]) 03/09/2008  . Flu 12/08/2016  . GOUT  03/09/2008  . Hemorrhoids 08/2009   internal.   . History of blood transfusion   . HYPERLIPIDEMIA 03/09/2008  . Hyperparathyroidism ([place])   . HYPERTENSION 03/09/2008  . Prostate cancer [facility]) 2002   Completed external beam radiation 2003.per HPI  . PROSTATE CANCER, HX OF 03/09/2008  . Radiation cystitis 2010.  Marland Kitchen Stroke [facility])     Patient Active Problem List   Diagnosis Date Noted  . Bilateral hearing loss due to cerumen impaction 03/17/2019  . Irregular heart rhythm 10/10/2018  . Left flank pain 01/10/2018  . Influenza A 12/08/2016  . Elevated troponin 12/08/2016  . Anemia in chronic kidney disease (CKD) 10/04/2016  . Orthostatic hypotension 09/07/2016  . Mass of skin of finger of left hand 10/11/2015  . ESRD on dialysis ([place]) 09/09/2015  . Cerebrovascular accident (CVA) due to thrombosis of right middle cerebral artery ([place]) 09/09/2015  . History of GI bleed   . HLD (hyperlipidemia)   . Benign neoplasm of descending colon   . Gastritis   . Syncope 06/15/2015  . Diverticulosis of colon with hemorrhage   . Low back pain 03/26/2014  . Depression 03/26/2014  . Complex renal cyst 06/19/2011  . Preventative health care 06/17/2011  . SHOULDER PAIN, LEFT 06/03/2010  . Gout 03/09/2008  . Iron deficiency anemia 03/09/2008  . Essential hypertension 03/09/2008  . FATIGUE 03/09/2008  . PROSTATE CANCER, HX OF 03/09/2008    Past Surgical History:  Procedure Laterality Date  . AV FISTULA  PLACEMENT Left 01/24/2013   Procedure: INSERTION OF ARTERIOVENOUS (AV) GORE-TEX GRAFT ARM;  Surgeon: Rosetta Posner, MD;  Location: [place];  Service: Vascular;  Laterality: Left;  . COLONOSCOPY N/A 06/17/2015   Procedure: COLONOSCOPY;  Surgeon: Gatha Mayer, MD;  Location: [place];  Service: Endoscopy;  Laterality: N/A;  . ESOPHAGOGASTRODUODENOSCOPY N/A 06/17/2015   Procedure: ESOPHAGOGASTRODUODENOSCOPY (EGD);  Surgeon: Gatha Mayer, MD;  Location: [facility] ENDOSCOPY;  Service: Endoscopy;  Laterality: N/A;  .  FLEXIBLE SIGMOIDOSCOPY N/A 06/15/2015   Procedure: FLEXIBLE SIGMOIDOSCOPY;  Surgeon: Gatha Mayer, MD;  Location: [place];  Service: Endoscopy;  Laterality: N/A;  . GIVENS CAPSULE STUDY N/A 07/06/2015   Procedure: GIVENS CAPSULE STUDY;  Surgeon: Milus Banister, MD;  Location: [place];  Service: Endoscopy;  Laterality: N/A;  . JOINT REPLACEMENT     HIP  . rotator cuff repair right  4/08  . s/p left hip replacement  2007   Dr. Percell Miller ortho  . TONSILLECTOMY         Family History  Problem Relation Age of Onset  . Hypertension Father   . Diabetes Father   . Cancer Father        prostate cancer  . Hypertension Brother   . Cancer Brother     Social History   Tobacco Use  . Smoking status: Former Smoker    Years: 5.00    Types: Cigarettes    Quit date: 09/17/1982    Years since quitting: 37.2  . Smokeless tobacco: Never Used  Substance Use Topics  . Alcohol use: No    Alcohol/week: 0.0 standard drinks    Comment: rare  . Drug use: No    Home Medications Prior to Admission medications   Medication Sig Start Date End Date Taking? Authorizing Provider  allopurinol (ZYLOPRIM) 100 MG tablet Take 1 tablet (100 mg total) by mouth daily. 12/30/18   [name], MD  aspirin EC 81 MG tablet Take 1 tablet (81 mg total) by mouth daily. 12/12/16   Rosalin Hawking, MD  AURYXIA 1 GM 210 MG(Fe) tablet TAKE 2 TABLETS BY MOUTH THREE TIMES A DAY WITH MEALS.   SWALLOW WHOLE, DO NOT CHEW OR CRUSH MEDICATION 04/30/18   [provider]  citalopram (CELEXA) 10 MG tablet TAKE 1 TABLET BY MOUTH EVERY DAY 07/21/19   [name], MD  latanoprost (XALATAN) 0.005 % ophthalmic solution INSTILL 1 DROP BY OPHTHALMIC ROUTE EVERY DAY INTO BOTH EYE(S) IN THE EVENING 05/05/18   [provider]  lidocaine-prilocaine (EMLA) cream APPLY SMALL AMOUNT TO ACCESS SITE (AVF) 1 HOUR BEFORE DIALYSIS. COVER WITH OCCLUSIVE DRESSING (SARAN WRAP) 06/07/18   [provider]  midodrine (PROAMATINE)  10 MG tablet Take 1 tablet (10 mg total) by mouth 3 (three) times daily. Take in the morning, lunch time and 4pm. Do not take after 4pm. 06/20/18   Frann Rider, NP  Multiple Vitamin (MULTIVITAMIN WITH MINERALS) TABS tablet Take 1 tablet by mouth daily.    [provider]  nystatin (MYCOSTATIN/NYSTOP) powder Use as directed twice per day Patient taking differently: Apply 1 g topically 2 (two) times daily as needed (rash).  07/06/16   [name], MD  rosuvastatin (CRESTOR) 20 MG tablet Take 1 tablet (20 mg total) by mouth at bedtime. 01/15/19   [name], MD    Allergies    Patient has no known allergies.  Review of Systems   Review of Systems  All other systems reviewed and  are negative.   Physical Exam Updated Vital Signs BP (!) 119/59   Pulse 80   Temp (!) 97.5 F (36.4 C) (Oral)   Resp 19   SpO2 100%   Physical Exam Vitals and nursing note reviewed.  Constitutional:      General: He is not in acute distress.    Appearance: He is well-developed.  HENT:     Head: Normocephalic and atraumatic.  Eyes:     Extraocular Movements: Extraocular movements intact.     Conjunctiva/sclera: Conjunctivae normal.     Pupils: Pupils are equal, round, and reactive to light.  Cardiovascular:     Rate and Rhythm: Normal rate and regular rhythm.     Pulses: Normal pulses.     Heart sounds: Normal heart sounds.  Pulmonary:     Effort: Pulmonary effort is normal.     Breath sounds: Normal breath sounds.  Abdominal:     Palpations: Abdomen is soft.  Musculoskeletal:     Cervical back: Normal range of motion and neck supple.  Skin:    Findings: No rash.     Comments: Left arm: Open AV fistula wound, oozing a small amount of blood from the wound.  Proximally AV fistula with palpable thrills at the brachiocephalic region.  Radial pulse 2+, forearm compartment soft.  No evidence of infection.  Neurological:     Mental Status: He is alert.     GCS: GCS eye subscore is 4. GCS  verbal subscore is 5. GCS motor subscore is 6.     Comments: Alert and oriented x2.     ED Results / Procedures / Treatments   Labs (all labs ordered are listed, but only abnormal results are displayed) Labs Reviewed  CBC - Abnormal; Notable for the following components:      Result Value   RBC 2.97 (*)    Hemoglobin 9.3 (*)    HCT 30.4 (*)    MCV 102.4 (*)    RDW 17.4 (*)    All other components within normal limits  I-STAT CHEM 8, ED - Abnormal; Notable for the following components:   Chloride 95 (*)    Creatinine, Ser 6.30 (*)    Glucose, Bld 156 (*)    Calcium, Ion 0.97 (*)    Hemoglobin 10.2 (*)    HCT 30.0 (*)    All other components within normal limits  CBG MONITORING, ED - Abnormal; Notable for the following components:   Glucose-Capillary 145 (*)    All other components within normal limits  PROTIME-INR    EKG None  ED ECG REPORT   Date: 11/27/2019  Rate: 74  Rhythm: normal sinus rhythm  QRS Axis: left  Intervals: normal  ST/T Wave abnormalities: nonspecific T wave changes  Conduction Disutrbances:none  Narrative Interpretation: old inferior and anterior infarct  Old EKG Reviewed: unchanged  I have personally reviewed the EKG tracing and agree with the computerized printout as noted.   Radiology No results found.  Procedures Procedures (including critical care time)  Medications Ordered in ED Medications  lidocaine-EPINEPHrine (XYLOCAINE W/EPI) 2 %-1:100000 (with pres) injection 20 mL (20 mLs Infiltration Not Given 11/27/19 1232)    ED Course  I have reviewed the triage vital signs and the nursing notes.  Pertinent labs & imaging results that were available during my care of the patient were reviewed by me and considered in my medical decision making (see chart for details).    MDM Rules/Calculators/A&P  BP 130/69   Pulse 80   Temp (!) 97.5 F (36.4 C) (Oral)   Resp 19   SpO2 100%   Final Clinical Impression(s) /  ED Diagnoses Final diagnoses:  Bleeding from dialysis shunt, initial encounter ([place])  Vasovagal syncope    Rx / DC Orders ED Discharge Orders    None     11:00 AM Patient had a full course of dialysis yesterday however today with upon removal of his dressing from his left AV fistula, it begins to bleed a profuse amount.  He had a syncopal episode during this incident and was able to get down to the floor by his wife without any significant injury.  He was reported that he may have lost a moderate amount of blood.  On examination he does have a puncture wound to the left AV fistula oozing a small amount of blood.  Wound was cleaned.  Quick clot applied as well as pressure dressing wrapped with Coban.  Will monitor closely.  Hemoglobin is 10.2 near baseline.  CBG 145.  1:12 PM Pt have been monitored for the past 3 hrs without active bleeding.  Recommend keeping pressure dressing on until tomorrow.  Encourage rest and f/u for dialysis tomorrow as scheduled.  Pt able to ambulate and stable for discharge. EKG without concerning arrhythmia or acute ischemic changes.  Pt does report dizziness with ambulation.  No focal neuro deficit.  I suspect pt may have been overdiurese during his previous dialysis session causing weakness.  Will check orthostatic vital sign.   1:36 PM Positive orthostatic vital sign and patient was symptomatic.  Suspect this is consistent with patient being over dialyzed from previous session causing dehydration and contributing to his syncopal episode.  Encourage rest at home, hydrate appropriately and assessed further by PCP.  Return precaution discussed.  Care discussed with Dr. Johnney Killian.    Domenic Moras, PA-C 11/27/19 1339    Charlesetta Shanks, MD 11/28/19 [phone]

## 2019-11-27 NOTE — Discharge Instructions (Addendum)
You have been evaluated for your syncopal episode and your bleeding dialysis site.  Please keep pressure dressing until tomorrow when you follow up with your dialysis session. You may have been overdiurese from your last dialysis session causing your passing out episode. This is likely due to too much fluid taking off from your dialysis session causing dehydration. Please rest today, eat and drink to replenish your energy.  Return to the ER if you have any concerns.

## 2019-11-29 DIAGNOSIS — N2581 Secondary hyperparathyroidism of renal origin: Secondary | ICD-10-CM | POA: Diagnosis not present

## 2019-11-29 DIAGNOSIS — Z992 Dependence on renal dialysis: Secondary | ICD-10-CM | POA: Diagnosis not present

## 2019-11-29 DIAGNOSIS — D631 Anemia in chronic kidney disease: Secondary | ICD-10-CM | POA: Diagnosis not present

## 2019-11-29 DIAGNOSIS — N186 End stage renal disease: Secondary | ICD-10-CM | POA: Diagnosis not present

## 2019-12-02 DIAGNOSIS — D631 Anemia in chronic kidney disease: Secondary | ICD-10-CM | POA: Diagnosis not present

## 2019-12-02 DIAGNOSIS — N2581 Secondary hyperparathyroidism of renal origin: Secondary | ICD-10-CM | POA: Diagnosis not present

## 2019-12-02 DIAGNOSIS — N186 End stage renal disease: Secondary | ICD-10-CM | POA: Diagnosis not present

## 2019-12-02 DIAGNOSIS — Z992 Dependence on renal dialysis: Secondary | ICD-10-CM | POA: Diagnosis not present

## 2019-12-02 DIAGNOSIS — I871 Compression of vein: Secondary | ICD-10-CM | POA: Diagnosis not present

## 2019-12-02 DIAGNOSIS — T82868A Thrombosis of vascular prosthetic devices, implants and grafts, initial encounter: Secondary | ICD-10-CM | POA: Diagnosis not present

## 2019-12-03 DIAGNOSIS — N2581 Secondary hyperparathyroidism of renal origin: Secondary | ICD-10-CM | POA: Diagnosis not present

## 2019-12-03 DIAGNOSIS — D631 Anemia in chronic kidney disease: Secondary | ICD-10-CM | POA: Diagnosis not present

## 2019-12-03 DIAGNOSIS — N186 End stage renal disease: Secondary | ICD-10-CM | POA: Diagnosis not present

## 2019-12-03 DIAGNOSIS — Z992 Dependence on renal dialysis: Secondary | ICD-10-CM | POA: Diagnosis not present

## 2019-12-04 ENCOUNTER — Other Ambulatory Visit: Payer: Self-pay | Admitting: Internal Medicine

## 2019-12-04 ENCOUNTER — Telehealth: Payer: Self-pay

## 2019-12-04 ENCOUNTER — Other Ambulatory Visit: Payer: Self-pay

## 2019-12-04 ENCOUNTER — Encounter: Payer: Self-pay | Admitting: Internal Medicine

## 2019-12-04 ENCOUNTER — Ambulatory Visit (INDEPENDENT_AMBULATORY_CARE_PROVIDER_SITE_OTHER): Payer: Medicare Other | Admitting: Internal Medicine

## 2019-12-04 VITALS — BP 144/72 | HR 83 | Temp 97.9°F | Ht 69.0 in | Wt 146.0 lb

## 2019-12-04 DIAGNOSIS — Z992 Dependence on renal dialysis: Secondary | ICD-10-CM | POA: Diagnosis not present

## 2019-12-04 DIAGNOSIS — F329 Major depressive disorder, single episode, unspecified: Secondary | ICD-10-CM | POA: Diagnosis not present

## 2019-12-04 DIAGNOSIS — Z0000 Encounter for general adult medical examination without abnormal findings: Secondary | ICD-10-CM

## 2019-12-04 DIAGNOSIS — E559 Vitamin D deficiency, unspecified: Secondary | ICD-10-CM | POA: Diagnosis not present

## 2019-12-04 DIAGNOSIS — E538 Deficiency of other specified B group vitamins: Secondary | ICD-10-CM | POA: Diagnosis not present

## 2019-12-04 DIAGNOSIS — D509 Iron deficiency anemia, unspecified: Secondary | ICD-10-CM

## 2019-12-04 DIAGNOSIS — F32A Depression, unspecified: Secondary | ICD-10-CM

## 2019-12-04 DIAGNOSIS — M1030 Gout due to renal impairment, unspecified site: Secondary | ICD-10-CM | POA: Diagnosis not present

## 2019-12-04 DIAGNOSIS — I10 Essential (primary) hypertension: Secondary | ICD-10-CM | POA: Diagnosis not present

## 2019-12-04 DIAGNOSIS — Z23 Encounter for immunization: Secondary | ICD-10-CM | POA: Diagnosis not present

## 2019-12-04 DIAGNOSIS — D631 Anemia in chronic kidney disease: Secondary | ICD-10-CM

## 2019-12-04 DIAGNOSIS — E785 Hyperlipidemia, unspecified: Secondary | ICD-10-CM

## 2019-12-04 DIAGNOSIS — N186 End stage renal disease: Secondary | ICD-10-CM

## 2019-12-04 DIAGNOSIS — F411 Generalized anxiety disorder: Secondary | ICD-10-CM

## 2019-12-04 LAB — IBC PANEL
Iron: 42 ug/dL (ref 42–165)
Saturation Ratios: 17.6 % — ABNORMAL LOW (ref 20.0–50.0)
Transferrin: 170 mg/dL — ABNORMAL LOW (ref 212.0–360.0)

## 2019-12-04 LAB — CBC WITH DIFFERENTIAL/PLATELET
Basophils Absolute: 0.1 10*3/uL (ref 0.0–0.1)
Basophils Relative: 1 % (ref 0.0–3.0)
Eosinophils Absolute: 0.1 10*3/uL (ref 0.0–0.7)
Eosinophils Relative: 2 % (ref 0.0–5.0)
HCT: 24.9 % — ABNORMAL LOW (ref 39.0–52.0)
Hemoglobin: 8.3 g/dL — ABNORMAL LOW (ref 13.0–17.0)
Lymphocytes Relative: 10.1 % — ABNORMAL LOW (ref 12.0–46.0)
Lymphs Abs: 0.7 10*3/uL (ref 0.7–4.0)
MCHC: 33.3 g/dL (ref 30.0–36.0)
MCV: 96.4 fl (ref 78.0–100.0)
Monocytes Absolute: 1.1 10*3/uL — ABNORMAL HIGH (ref 0.1–1.0)
Monocytes Relative: 14.6 % — ABNORMAL HIGH (ref 3.0–12.0)
Neutro Abs: 5.3 10*3/uL (ref 1.4–7.7)
Neutrophils Relative %: 72.3 % (ref 43.0–77.0)
Platelets: 208 10*3/uL (ref 150.0–400.0)
RBC: 2.58 Mil/uL — ABNORMAL LOW (ref 4.22–5.81)
RDW: 18.3 % — ABNORMAL HIGH (ref 11.5–15.5)
WBC: 7.3 10*3/uL (ref 4.0–10.5)

## 2019-12-04 LAB — BASIC METABOLIC PANEL
BUN: 20 mg/dL (ref 6–23)
CO2: 33 mEq/L — ABNORMAL HIGH (ref 19–32)
Calcium: 9.5 mg/dL (ref 8.4–10.5)
Chloride: 92 mEq/L — ABNORMAL LOW (ref 96–112)
Creatinine, Ser: 6.81 mg/dL (ref 0.40–1.50)
GFR: 9.32 mL/min — CL (ref >60.00)
Glucose, Bld: 90 mg/dL (ref 70–99)
Potassium: 5 mEq/L (ref 3.5–5.1)
Sodium: 137 mEq/L (ref 135–145)

## 2019-12-04 LAB — LIPID PANEL
Cholesterol: 213 mg/dL — ABNORMAL HIGH (ref 0–200)
HDL: 77.5 mg/dL (ref >39.00)
LDL Cholesterol: 120 mg/dL — ABNORMAL HIGH (ref 0–99)
NonHDL: 135.38
Total CHOL/HDL Ratio: 3
Triglycerides: 75 mg/dL (ref 0.0–149.0)
VLDL: 15 mg/dL (ref 0.0–40.0)

## 2019-12-04 LAB — HEPATIC FUNCTION PANEL
ALT: 10 U/L (ref 0–53)
AST: 16 U/L (ref 0–37)
Albumin: 4.1 g/dL (ref 3.5–5.2)
Alkaline Phosphatase: 58 U/L (ref 39–117)
Bilirubin, Direct: 0.1 mg/dL (ref 0.0–0.3)
Total Bilirubin: 0.6 mg/dL (ref 0.2–1.2)
Total Protein: 7 g/dL (ref 6.0–8.3)

## 2019-12-04 LAB — TSH: TSH: 1.61 u[IU]/mL (ref 0.35–4.50)

## 2019-12-04 LAB — VITAMIN D 25 HYDROXY (VIT D DEFICIENCY, FRACTURES): VITD: 15.76 ng/mL — ABNORMAL LOW (ref 30.00–100.00)

## 2019-12-04 LAB — VITAMIN B12: Vitamin B-12: 272 pg/mL (ref 211–911)

## 2019-12-04 MED ORDER — VITAMIN D (ERGOCALCIFEROL) 1.25 MG (50000 UNIT) PO CAPS: 50000.0000 [IU] | ORAL_CAPSULE | ORAL | 0 refills | Status: AC

## 2019-12-04 NOTE — Patient Instructions (Addendum)
You had the flu shot and Tdap tetanus shots today  You are signed up on the [facility] Covid Vaccine Wait List  Please continue all other medications as before, and refills have been done if requested.  Please have the pharmacy call with any other refills you may need.  Please continue your efforts at being more active, low cholesterol diet, and weight control.  You are otherwise up to date with prevention measures today.  Please keep your appointments with your specialists as you may have planned  Please go to the LAB at the blood drawing area for the tests to be done  You will be contacted by phone if any changes need to be made immediately.  Otherwise, you will receive a letter about your results with an explanation, but please check with MyChart first.  Please remember to sign up for MyChart if you have not done so, as this will be important to you in the future with finding out test results, communicating by private email, and scheduling acute appointments online when needed.  Please make an Appointment to return in 6 months, or sooner if needed

## 2019-12-04 NOTE — Telephone Encounter (Signed)
Received a call from [place] me of critical labs on Creatinine-low at 6.81, GFR-High at 9.31

## 2019-12-04 NOTE — Progress Notes (Signed)
Subjective:    Patient ID: [name], male    DOB: July 13, 1929, 86 y.o.   MRN: [id number]  HPI   Here for yearly f/u;  Overall doing ok;  Pt denies Chest pain, worsening SOB, DOE, wheezing, orthopnea, PND, worsening LE edema, palpitations, dizziness or syncope.  Pt denies neurological change such as new headache, facial or extremity weakness.  Pt denies polydipsia, polyuria, or low sugar symptoms. Pt states overall good compliance with treatment and medications, good tolerability, and has been trying to follow appropriate diet.  Pt denies worsening depressive symptoms, suicidal ideation or panic. No fever, night sweats, wt loss, loss of appetite, or other constitutional symptoms.  Pt states good ability with ADL's, has low fall risk, home safety reviewed and adequate, no other significant changes in hearing or vision, also for HD M-W-F. No new complaints, due to Tdap and flu shots Past Medical History:  Diagnosis Date   ANEMIA-IRON DEFICIENCY 03/09/2008   Arthritis    cervical spine.    Complex renal cyst 06/19/2011   Depression 03/26/2014   DIVERTICULOSIS, COLON 03/09/2008   ESRD (end stage renal disease) ([place]) 03/09/2008   Flu 12/08/2016   GOUT 03/09/2008   Hemorrhoids 08/2009   internal.    History of blood transfusion    HYPERLIPIDEMIA 03/09/2008   Hyperparathyroidism ([place])    HYPERTENSION 03/09/2008   Prostate cancer ([place]) 2002   Completed external beam radiation 2003.per HPI   PROSTATE CANCER, HX OF 03/09/2008   Radiation cystitis 2010.   Stroke [facility])    Past Surgical History:  Procedure Laterality Date   AV FISTULA PLACEMENT Left 01/24/2013   Procedure: INSERTION OF ARTERIOVENOUS (AV) GORE-TEX GRAFT ARM;  Surgeon: Rosetta Posner, MD;  Location: [place];  Service: Vascular;  Laterality: Left;   COLONOSCOPY N/A 06/17/2015   Procedure: COLONOSCOPY;  Surgeon: Gatha Mayer, MD;  Location: [place];  Service: Endoscopy;  Laterality: N/A;   ESOPHAGOGASTRODUODENOSCOPY N/A  06/17/2015   Procedure: ESOPHAGOGASTRODUODENOSCOPY (EGD);  Surgeon: Gatha Mayer, MD;  Location[facility] ENDOSCOPY;  Service: Endoscopy;  Laterality: N/A;   FLEXIBLE SIGMOIDOSCOPY N/A 06/15/2015   Procedure: FLEXIBLE SIGMOIDOSCOPY;  Surgeon: Gatha Mayer, MD;  Location: [place];  Service: Endoscopy;  Laterality: N/A;   GIVENS CAPSULE STUDY N/A 07/06/2015   Procedure: GIVENS CAPSULE STUDY;  Surgeon: Milus Banister, MD;  Location: [place];  Service: Endoscopy;  Laterality: N/A;   JOINT REPLACEMENT     HIP   rotator cuff repair right  4/08   s/p left hip replacement  2007   Dr. Percell Miller ortho   TONSILLECTOMY      reports that he quit smoking about 37 years ago. His smoking use included cigarettes. He quit after 5.00 years of use. He has never used smokeless tobacco. He reports that he does not drink alcohol or use drugs. family history includes Cancer in his brother and father; Diabetes in his father; Hypertension in his brother and father. No Known Allergies Current Outpatient Medications on File Prior to Visit  Medication Sig Dispense Refill   allopurinol (ZYLOPRIM) 100 MG tablet Take 1 tablet (100 mg total) by mouth daily. 90 tablet 3   aspirin EC 81 MG tablet Take 1 tablet (81 mg total) by mouth daily.     AURYXIA 1 GM 210 MG(Fe) tablet TAKE 2 TABLETS BY MOUTH THREE TIMES A DAY WITH MEALS.   SWALLOW WHOLE, DO NOT CHEW OR CRUSH MEDICATION  10   citalopram (CELEXA) 10 MG tablet  TAKE 1 TABLET BY MOUTH EVERY DAY 90 tablet 1   latanoprost (XALATAN) 0.005 % ophthalmic solution INSTILL 1 DROP BY OPHTHALMIC ROUTE EVERY DAY INTO BOTH EYE(S) IN THE EVENING  5   lidocaine-prilocaine (EMLA) cream APPLY SMALL AMOUNT TO ACCESS SITE (AVF) 1 HOUR BEFORE DIALYSIS. COVER WITH OCCLUSIVE DRESSING (SARAN WRAP)  11   midodrine (PROAMATINE) 10 MG tablet Take 1 tablet (10 mg total) by mouth 3 (three) times daily. Take in the morning, lunch time and 4pm. Do not take after 4pm. 270 tablet 3    Multiple Vitamin (MULTIVITAMIN WITH MINERALS) TABS tablet Take 1 tablet by mouth daily.     nystatin (MYCOSTATIN/NYSTOP) powder Use as directed twice per day (Patient taking differently: Apply 1 g topically 2 (two) times daily as needed (rash). ) 45 g 1   rosuvastatin (CRESTOR) 20 MG tablet Take 1 tablet (20 mg total) by mouth at bedtime. 90 tablet 3   No current facility-administered medications on file prior to visit.   All otherwise neg per pt  Review of Systems All otherwise neg per pt     Objective:   Physical Exam BP (!) 144/72    Pulse 83    Temp 97.9 F (36.6 C)    Ht 5\' 9"  (1.753 m)    Wt 146 lb (66.2 kg)    SpO2 98%    BMI 21.56 kg/m  VS noted,  Constitutional: Pt appears in NAD HENT: Head: NCAT.  Right Ear: External ear normal.  Left Ear: External ear normal.  Eyes: . Pupils are equal, round, and reactive to light. Conjunctivae and EOM are normal Nose: without d/c or deformity Neck: Neck supple. Gross normal ROM Cardiovascular: Normal rate and regular rhythm.   Pulmonary/Chest: Effort normal and breath sounds without rales or wheezing.  Abd:  Soft, NT, ND, + BS, no organomegaly Neurological: Pt is alert. At baseline orientation, motor grossly intact Skin: Skin is warm. No rashes, other new lesions, no LE edema Psychiatric: Pt behavior is normal without agitation  All otherwise neg per pt Lab Results  Component Value Date   WBC 9.8 11/27/2019   HGB 10.2 (L) 11/27/2019   HCT 30.0 (L) 11/27/2019   PLT 246 11/27/2019   GLUCOSE 156 (H) 11/27/2019   CHOL 223 (H) 03/17/2019   TRIG 57.0 03/17/2019   HDL 89.40 03/17/2019   LDLCALC 123 (H) 03/17/2019   ALT 10 03/17/2019   AST 16 03/17/2019   NA 135 11/27/2019   K 4.6 11/27/2019   CL 95 (L) 11/27/2019   CREATININE 6.30 (H) 11/27/2019   BUN 17 11/27/2019   CO2 36 (H) 03/17/2019   TSH 0.83 10/10/2018   INR 1.1 11/27/2019   HGBA1C 4.9 07/05/2015  '      Assessment & Plan:

## 2019-12-04 NOTE — Assessment & Plan Note (Signed)
For lab f/u  I spent 40 minutes preparing to see the patient by review of recent labs, imaging and procedures, obtaining and reviewing separately obtained history, communicating with the patient and family or caregiver, ordering medications, tests or procedures, and documenting clinical information in the EHR including the differential Dx, treatment, and any further evaluation and other management of iron def anemia,  HLD, gout HTN, depression, anemia with CKD

## 2019-12-05 DIAGNOSIS — Z992 Dependence on renal dialysis: Secondary | ICD-10-CM | POA: Diagnosis not present

## 2019-12-05 DIAGNOSIS — D631 Anemia in chronic kidney disease: Secondary | ICD-10-CM | POA: Diagnosis not present

## 2019-12-05 DIAGNOSIS — N2581 Secondary hyperparathyroidism of renal origin: Secondary | ICD-10-CM | POA: Diagnosis not present

## 2019-12-05 DIAGNOSIS — N186 End stage renal disease: Secondary | ICD-10-CM | POA: Diagnosis not present

## 2019-12-08 DIAGNOSIS — N186 End stage renal disease: Secondary | ICD-10-CM | POA: Diagnosis not present

## 2019-12-08 DIAGNOSIS — N2581 Secondary hyperparathyroidism of renal origin: Secondary | ICD-10-CM | POA: Diagnosis not present

## 2019-12-08 DIAGNOSIS — D509 Iron deficiency anemia, unspecified: Secondary | ICD-10-CM | POA: Diagnosis not present

## 2019-12-08 DIAGNOSIS — I158 Other secondary hypertension: Secondary | ICD-10-CM | POA: Diagnosis not present

## 2019-12-08 DIAGNOSIS — A499 Bacterial infection, unspecified: Secondary | ICD-10-CM | POA: Diagnosis not present

## 2019-12-08 DIAGNOSIS — D631 Anemia in chronic kidney disease: Secondary | ICD-10-CM | POA: Diagnosis not present

## 2019-12-08 DIAGNOSIS — Z992 Dependence on renal dialysis: Secondary | ICD-10-CM | POA: Diagnosis not present

## 2019-12-10 DIAGNOSIS — Z992 Dependence on renal dialysis: Secondary | ICD-10-CM | POA: Diagnosis not present

## 2019-12-10 DIAGNOSIS — N2581 Secondary hyperparathyroidism of renal origin: Secondary | ICD-10-CM | POA: Diagnosis not present

## 2019-12-10 DIAGNOSIS — D509 Iron deficiency anemia, unspecified: Secondary | ICD-10-CM | POA: Diagnosis not present

## 2019-12-10 DIAGNOSIS — N186 End stage renal disease: Secondary | ICD-10-CM | POA: Diagnosis not present

## 2019-12-10 DIAGNOSIS — D631 Anemia in chronic kidney disease: Secondary | ICD-10-CM | POA: Diagnosis not present

## 2019-12-10 DIAGNOSIS — A499 Bacterial infection, unspecified: Secondary | ICD-10-CM | POA: Diagnosis not present

## 2019-12-12 DIAGNOSIS — Z992 Dependence on renal dialysis: Secondary | ICD-10-CM | POA: Diagnosis not present

## 2019-12-12 DIAGNOSIS — A499 Bacterial infection, unspecified: Secondary | ICD-10-CM | POA: Diagnosis not present

## 2019-12-12 DIAGNOSIS — D631 Anemia in chronic kidney disease: Secondary | ICD-10-CM | POA: Diagnosis not present

## 2019-12-12 DIAGNOSIS — D509 Iron deficiency anemia, unspecified: Secondary | ICD-10-CM | POA: Diagnosis not present

## 2019-12-12 DIAGNOSIS — N2581 Secondary hyperparathyroidism of renal origin: Secondary | ICD-10-CM | POA: Diagnosis not present

## 2019-12-12 DIAGNOSIS — N186 End stage renal disease: Secondary | ICD-10-CM | POA: Diagnosis not present

## 2019-12-15 ENCOUNTER — Telehealth: Payer: Self-pay

## 2019-12-15 DIAGNOSIS — N2581 Secondary hyperparathyroidism of renal origin: Secondary | ICD-10-CM | POA: Diagnosis not present

## 2019-12-15 DIAGNOSIS — I951 Orthostatic hypotension: Secondary | ICD-10-CM

## 2019-12-15 DIAGNOSIS — A499 Bacterial infection, unspecified: Secondary | ICD-10-CM | POA: Diagnosis not present

## 2019-12-15 DIAGNOSIS — D509 Iron deficiency anemia, unspecified: Secondary | ICD-10-CM | POA: Diagnosis not present

## 2019-12-15 DIAGNOSIS — D631 Anemia in chronic kidney disease: Secondary | ICD-10-CM | POA: Diagnosis not present

## 2019-12-15 DIAGNOSIS — Z992 Dependence on renal dialysis: Secondary | ICD-10-CM | POA: Diagnosis not present

## 2019-12-15 DIAGNOSIS — N186 End stage renal disease: Secondary | ICD-10-CM | POA: Diagnosis not present

## 2019-12-15 MED ORDER — MIDODRINE HCL 10 MG PO TABS: 10.0000 mg | ORAL_TABLET | Freq: Three times a day (TID) | ORAL | 3 refills | Status: AC

## 2019-12-15 MED ORDER — ROSUVASTATIN CALCIUM 20 MG PO TABS: 20.0000 mg | ORAL_TABLET | Freq: Every day | ORAL | 3 refills | Status: AC

## 2019-12-15 NOTE — Telephone Encounter (Signed)
  Medication Requested:   Is medication on med list (if no, inform pt they may need an appointment):   midodrine (PROAMATINE) 10 MG tablet   rosuvastatin (CRESTOR) 20 MG tablet  Is medication a controled (yes = last OV with PCP): No  Is the OV > than 4 months (yes = schedule an appt if one is not already made):   Pharmacy (Name [place]): [facility] [address]

## 2019-12-17 ENCOUNTER — Telehealth: Payer: Self-pay

## 2019-12-17 DIAGNOSIS — A499 Bacterial infection, unspecified: Secondary | ICD-10-CM | POA: Diagnosis not present

## 2019-12-17 DIAGNOSIS — Z992 Dependence on renal dialysis: Secondary | ICD-10-CM | POA: Diagnosis not present

## 2019-12-17 DIAGNOSIS — D631 Anemia in chronic kidney disease: Secondary | ICD-10-CM | POA: Diagnosis not present

## 2019-12-17 DIAGNOSIS — D509 Iron deficiency anemia, unspecified: Secondary | ICD-10-CM | POA: Diagnosis not present

## 2019-12-17 DIAGNOSIS — N2581 Secondary hyperparathyroidism of renal origin: Secondary | ICD-10-CM | POA: Diagnosis not present

## 2019-12-17 DIAGNOSIS — N186 End stage renal disease: Secondary | ICD-10-CM | POA: Diagnosis not present

## 2019-12-17 NOTE — Telephone Encounter (Signed)
New message   Patient needs medication called in today.   1. Which medications need to be refilled? (please list name of each medication and dose if known) AURYXIA 1 GM 210 MG(Fe) tablet  2. Which pharmacy/location (including street and city if local pharmacy) is medication to be sent to? [facility] [address]    3. Do they need a 30 day or 90 day supply? 30 days supply

## 2019-12-17 NOTE — Telephone Encounter (Signed)
This is a medication I have not prescribed, I suspect maybe a renal physician?

## 2019-12-17 NOTE — Telephone Encounter (Signed)
Pt is requesting refill for Auryxia 1 gm 210 MG(Fe) tablet. Please advise.  PCP has never prescribed.

## 2019-12-18 NOTE — Telephone Encounter (Signed)
Spoke with patient's wife this morning and info given.

## 2019-12-19 DIAGNOSIS — N2581 Secondary hyperparathyroidism of renal origin: Secondary | ICD-10-CM | POA: Diagnosis not present

## 2019-12-19 DIAGNOSIS — D631 Anemia in chronic kidney disease: Secondary | ICD-10-CM | POA: Diagnosis not present

## 2019-12-19 DIAGNOSIS — N186 End stage renal disease: Secondary | ICD-10-CM | POA: Diagnosis not present

## 2019-12-19 DIAGNOSIS — Z992 Dependence on renal dialysis: Secondary | ICD-10-CM | POA: Diagnosis not present

## 2019-12-19 DIAGNOSIS — A499 Bacterial infection, unspecified: Secondary | ICD-10-CM | POA: Diagnosis not present

## 2019-12-19 DIAGNOSIS — D509 Iron deficiency anemia, unspecified: Secondary | ICD-10-CM | POA: Diagnosis not present

## 2019-12-22 DIAGNOSIS — A499 Bacterial infection, unspecified: Secondary | ICD-10-CM | POA: Diagnosis not present

## 2019-12-22 DIAGNOSIS — N2581 Secondary hyperparathyroidism of renal origin: Secondary | ICD-10-CM | POA: Diagnosis not present

## 2019-12-22 DIAGNOSIS — D509 Iron deficiency anemia, unspecified: Secondary | ICD-10-CM | POA: Diagnosis not present

## 2019-12-22 DIAGNOSIS — N186 End stage renal disease: Secondary | ICD-10-CM | POA: Diagnosis not present

## 2019-12-22 DIAGNOSIS — Z992 Dependence on renal dialysis: Secondary | ICD-10-CM | POA: Diagnosis not present

## 2019-12-22 DIAGNOSIS — D631 Anemia in chronic kidney disease: Secondary | ICD-10-CM | POA: Diagnosis not present

## 2019-12-24 DIAGNOSIS — D631 Anemia in chronic kidney disease: Secondary | ICD-10-CM | POA: Diagnosis not present

## 2019-12-24 DIAGNOSIS — N2581 Secondary hyperparathyroidism of renal origin: Secondary | ICD-10-CM | POA: Diagnosis not present

## 2019-12-24 DIAGNOSIS — Z992 Dependence on renal dialysis: Secondary | ICD-10-CM | POA: Diagnosis not present

## 2019-12-24 DIAGNOSIS — N186 End stage renal disease: Secondary | ICD-10-CM | POA: Diagnosis not present

## 2019-12-24 DIAGNOSIS — A499 Bacterial infection, unspecified: Secondary | ICD-10-CM | POA: Diagnosis not present

## 2019-12-24 DIAGNOSIS — D509 Iron deficiency anemia, unspecified: Secondary | ICD-10-CM | POA: Diagnosis not present

## 2019-12-25 ENCOUNTER — Ambulatory Visit: Payer: Medicare Other

## 2019-12-26 DIAGNOSIS — D509 Iron deficiency anemia, unspecified: Secondary | ICD-10-CM | POA: Diagnosis not present

## 2019-12-26 DIAGNOSIS — Z992 Dependence on renal dialysis: Secondary | ICD-10-CM | POA: Diagnosis not present

## 2019-12-26 DIAGNOSIS — N186 End stage renal disease: Secondary | ICD-10-CM | POA: Diagnosis not present

## 2019-12-26 DIAGNOSIS — D631 Anemia in chronic kidney disease: Secondary | ICD-10-CM | POA: Diagnosis not present

## 2019-12-26 DIAGNOSIS — A499 Bacterial infection, unspecified: Secondary | ICD-10-CM | POA: Diagnosis not present

## 2019-12-26 DIAGNOSIS — N2581 Secondary hyperparathyroidism of renal origin: Secondary | ICD-10-CM | POA: Diagnosis not present

## 2019-12-29 ENCOUNTER — Ambulatory Visit: Payer: Medicare Other

## 2019-12-29 DIAGNOSIS — D509 Iron deficiency anemia, unspecified: Secondary | ICD-10-CM | POA: Diagnosis not present

## 2019-12-29 DIAGNOSIS — D631 Anemia in chronic kidney disease: Secondary | ICD-10-CM | POA: Diagnosis not present

## 2019-12-29 DIAGNOSIS — N2581 Secondary hyperparathyroidism of renal origin: Secondary | ICD-10-CM | POA: Diagnosis not present

## 2019-12-29 DIAGNOSIS — N186 End stage renal disease: Secondary | ICD-10-CM | POA: Diagnosis not present

## 2019-12-29 DIAGNOSIS — Z992 Dependence on renal dialysis: Secondary | ICD-10-CM | POA: Diagnosis not present

## 2019-12-29 DIAGNOSIS — A499 Bacterial infection, unspecified: Secondary | ICD-10-CM | POA: Diagnosis not present

## 2019-12-31 DIAGNOSIS — Z992 Dependence on renal dialysis: Secondary | ICD-10-CM | POA: Diagnosis not present

## 2019-12-31 DIAGNOSIS — D509 Iron deficiency anemia, unspecified: Secondary | ICD-10-CM | POA: Diagnosis not present

## 2019-12-31 DIAGNOSIS — N2581 Secondary hyperparathyroidism of renal origin: Secondary | ICD-10-CM | POA: Diagnosis not present

## 2019-12-31 DIAGNOSIS — D631 Anemia in chronic kidney disease: Secondary | ICD-10-CM | POA: Diagnosis not present

## 2019-12-31 DIAGNOSIS — A499 Bacterial infection, unspecified: Secondary | ICD-10-CM | POA: Diagnosis not present

## 2019-12-31 DIAGNOSIS — N186 End stage renal disease: Secondary | ICD-10-CM | POA: Diagnosis not present

## 2020-01-02 DIAGNOSIS — D631 Anemia in chronic kidney disease: Secondary | ICD-10-CM | POA: Diagnosis not present

## 2020-01-02 DIAGNOSIS — N2581 Secondary hyperparathyroidism of renal origin: Secondary | ICD-10-CM | POA: Diagnosis not present

## 2020-01-02 DIAGNOSIS — D509 Iron deficiency anemia, unspecified: Secondary | ICD-10-CM | POA: Diagnosis not present

## 2020-01-02 DIAGNOSIS — A499 Bacterial infection, unspecified: Secondary | ICD-10-CM | POA: Diagnosis not present

## 2020-01-02 DIAGNOSIS — Z992 Dependence on renal dialysis: Secondary | ICD-10-CM | POA: Diagnosis not present

## 2020-01-02 DIAGNOSIS — N186 End stage renal disease: Secondary | ICD-10-CM | POA: Diagnosis not present

## 2020-01-05 DIAGNOSIS — E875 Hyperkalemia: Secondary | ICD-10-CM | POA: Diagnosis not present

## 2020-01-05 DIAGNOSIS — Z992 Dependence on renal dialysis: Secondary | ICD-10-CM | POA: Diagnosis not present

## 2020-01-05 DIAGNOSIS — D509 Iron deficiency anemia, unspecified: Secondary | ICD-10-CM | POA: Diagnosis not present

## 2020-01-05 DIAGNOSIS — N2581 Secondary hyperparathyroidism of renal origin: Secondary | ICD-10-CM | POA: Diagnosis not present

## 2020-01-05 DIAGNOSIS — N186 End stage renal disease: Secondary | ICD-10-CM | POA: Diagnosis not present

## 2020-01-05 DIAGNOSIS — I158 Other secondary hypertension: Secondary | ICD-10-CM | POA: Diagnosis not present

## 2020-01-07 DIAGNOSIS — Z992 Dependence on renal dialysis: Secondary | ICD-10-CM | POA: Diagnosis not present

## 2020-01-07 DIAGNOSIS — D509 Iron deficiency anemia, unspecified: Secondary | ICD-10-CM | POA: Diagnosis not present

## 2020-01-07 DIAGNOSIS — N2581 Secondary hyperparathyroidism of renal origin: Secondary | ICD-10-CM | POA: Diagnosis not present

## 2020-01-07 DIAGNOSIS — E875 Hyperkalemia: Secondary | ICD-10-CM | POA: Diagnosis not present

## 2020-01-07 DIAGNOSIS — N186 End stage renal disease: Secondary | ICD-10-CM | POA: Diagnosis not present

## 2020-01-08 ENCOUNTER — Telehealth (HOSPITAL_COMMUNITY): Payer: Self-pay

## 2020-01-08 ENCOUNTER — Ambulatory Visit: Payer: Medicare Other | Attending: Family

## 2020-01-08 ENCOUNTER — Encounter: Payer: Self-pay | Admitting: Internal Medicine

## 2020-01-08 ENCOUNTER — Ambulatory Visit (INDEPENDENT_AMBULATORY_CARE_PROVIDER_SITE_OTHER): Payer: Medicare Other | Admitting: Internal Medicine

## 2020-01-08 ENCOUNTER — Other Ambulatory Visit: Payer: Self-pay

## 2020-01-08 VITALS — BP 160/76 | HR 72 | Temp 98.9°F | Ht 69.0 in | Wt 150.2 lb

## 2020-01-08 DIAGNOSIS — Z992 Dependence on renal dialysis: Secondary | ICD-10-CM

## 2020-01-08 DIAGNOSIS — D294 Benign neoplasm of scrotum: Secondary | ICD-10-CM

## 2020-01-08 DIAGNOSIS — D509 Iron deficiency anemia, unspecified: Secondary | ICD-10-CM | POA: Diagnosis not present

## 2020-01-08 DIAGNOSIS — D631 Anemia in chronic kidney disease: Secondary | ICD-10-CM | POA: Diagnosis not present

## 2020-01-08 DIAGNOSIS — E559 Vitamin D deficiency, unspecified: Secondary | ICD-10-CM | POA: Diagnosis not present

## 2020-01-08 DIAGNOSIS — I10 Essential (primary) hypertension: Secondary | ICD-10-CM | POA: Diagnosis not present

## 2020-01-08 DIAGNOSIS — L989 Disorder of the skin and subcutaneous tissue, unspecified: Secondary | ICD-10-CM | POA: Insufficient documentation

## 2020-01-08 DIAGNOSIS — Z23 Encounter for immunization: Secondary | ICD-10-CM

## 2020-01-08 DIAGNOSIS — N186 End stage renal disease: Secondary | ICD-10-CM

## 2020-01-08 LAB — CBC WITH DIFFERENTIAL/PLATELET
Basophils Absolute: 0.1 10*3/uL (ref 0.0–0.1)
Basophils Relative: 0.8 % (ref 0.0–3.0)
Eosinophils Absolute: 0.4 10*3/uL (ref 0.0–0.7)
Eosinophils Relative: 5.6 % — ABNORMAL HIGH (ref 0.0–5.0)
HCT: 35.8 % — ABNORMAL LOW (ref 39.0–52.0)
Hemoglobin: 11.6 g/dL — ABNORMAL LOW (ref 13.0–17.0)
Lymphocytes Relative: 13.6 % (ref 12.0–46.0)
Lymphs Abs: 1.1 10*3/uL (ref 0.7–4.0)
MCHC: 32.4 g/dL (ref 30.0–36.0)
MCV: 102.1 fl — ABNORMAL HIGH (ref 78.0–100.0)
Monocytes Absolute: 1.3 10*3/uL — ABNORMAL HIGH (ref 0.1–1.0)
Monocytes Relative: 16.8 % — ABNORMAL HIGH (ref 3.0–12.0)
Neutro Abs: 5 10*3/uL (ref 1.4–7.7)
Neutrophils Relative %: 63.2 % (ref 43.0–77.0)
Platelets: 205 10*3/uL (ref 150.0–400.0)
RBC: 3.51 Mil/uL — ABNORMAL LOW (ref 4.22–5.81)
RDW: 19.5 % — ABNORMAL HIGH (ref 11.5–15.5)
WBC: 7.9 10*3/uL (ref 4.0–10.5)

## 2020-01-08 LAB — FERRITIN: Ferritin: >1500 ng/mL — ABNORMAL HIGH (ref 22.0–322.0)

## 2020-01-08 LAB — IBC PANEL
Iron: 90 ug/dL (ref 42–165)
Saturation Ratios: 37.6 % (ref 20.0–50.0)
Transferrin: 171 mg/dL — ABNORMAL LOW (ref 212.0–360.0)

## 2020-01-08 NOTE — Progress Notes (Signed)
   Covid-19 Vaccination Clinic  Name:  [name]    MRN: [id number] DOB: [birth year]  01/08/2020  [name] was observed post Covid-19 immunization for 15 minutes without incident. He was provided with Vaccine Information Sheet and instruction to access the V-Safe system.   [name] was instructed to call 911 with any severe reactions post vaccine: Marland Kitchen Difficulty breathing  . Swelling of face and throat  . A fast heartbeat  . A bad rash all over body  . Dizziness and weakness   Immunizations Administered    Name Date Dose VIS Date Route   Moderna COVID-19 Vaccine 01/08/2020 12:49 PM 0.5 mL 10/07/2019 Intramuscular   Manufacturer: Moderna   Lot: [id number]   [place]: [id number]

## 2020-01-08 NOTE — Progress Notes (Signed)
Subjective:    Patient ID: [name], male    DOB: Jul 10, 1929, 86 y.o.   MRN: [id number]  HPI  Here to f/u; overall doing ok,  Pt denies chest pain, increasing sob or doe, wheezing, orthopnea, PND, increased LE swelling, palpitations, dizziness or syncope.  Pt denies new neurological symptoms such as new headache, or facial or extremity weakness or numbness.  Pt denies polydipsia, polyuria, or low sugar episode.  Pt states overall good compliance with meds, mostly trying to follow appropriate diet, with wt overall stable, states BP at home < 140/90.  Has a rather large skin lesion to right scrotum.   Past Medical History:  Diagnosis Date  . ANEMIA-IRON DEFICIENCY 03/09/2008  . Arthritis    cervical spine.   . Complex renal cyst 06/19/2011  . Depression 03/26/2014  . DIVERTICULOSIS, COLON 03/09/2008  . ESRD (end stage renal disease) ([place]) 03/09/2008  . Flu 12/08/2016  . GOUT 03/09/2008  . Hemorrhoids 08/2009   internal.   . History of blood transfusion   . HYPERLIPIDEMIA 03/09/2008  . Hyperparathyroidism ([place])   . HYPERTENSION 03/09/2008  . Prostate cancer [facility]) 2002   Completed external beam radiation 2003.per HPI  . PROSTATE CANCER, HX OF 03/09/2008  . Radiation cystitis 2010.  Marland Kitchen Stroke [facility])    Past Surgical History:  Procedure Laterality Date  . AV FISTULA PLACEMENT Left 01/24/2013   Procedure: INSERTION OF ARTERIOVENOUS (AV) GORE-TEX GRAFT ARM;  Surgeon: Rosetta Posner, MD;  Location: [place];  Service: Vascular;  Laterality: Left;  . COLONOSCOPY N/A 06/17/2015   Procedure: COLONOSCOPY;  Surgeon: Gatha Mayer, MD;  Location: [place];  Service: Endoscopy;  Laterality: N/A;  . ESOPHAGOGASTRODUODENOSCOPY N/A 06/17/2015   Procedure: ESOPHAGOGASTRODUODENOSCOPY (EGD);  Surgeon: Gatha Mayer, MD;  Location: [facility] ENDOSCOPY;  Service: Endoscopy;  Laterality: N/A;  . FLEXIBLE SIGMOIDOSCOPY N/A 06/15/2015   Procedure: FLEXIBLE SIGMOIDOSCOPY;  Surgeon: Gatha Mayer, MD;  Location: [place];   Service: Endoscopy;  Laterality: N/A;  . GIVENS CAPSULE STUDY N/A 07/06/2015   Procedure: GIVENS CAPSULE STUDY;  Surgeon: Milus Banister, MD;  Location: [place];  Service: Endoscopy;  Laterality: N/A;  . JOINT REPLACEMENT     HIP  . rotator cuff repair right  4/08  . s/p left hip replacement  2007   Dr. Percell Miller ortho  . TONSILLECTOMY      reports that he quit smoking about 37 years ago. His smoking use included cigarettes. He quit after 5.00 years of use. He has never used smokeless tobacco. He reports that he does not drink alcohol or use drugs. family history includes Cancer in his brother and father; Diabetes in his father; Hypertension in his brother and father. No Known Allergies Current Outpatient Medications on File Prior to Visit  Medication Sig Dispense Refill  . allopurinol (ZYLOPRIM) 100 MG tablet Take 1 tablet (100 mg total) by mouth daily. 90 tablet 3  . aspirin EC 81 MG tablet Take 1 tablet (81 mg total) by mouth daily.    Lorin Picket 1 GM 210 MG(Fe) tablet TAKE 2 TABLETS BY MOUTH THREE TIMES A DAY WITH MEALS.   SWALLOW WHOLE, DO NOT CHEW OR CRUSH MEDICATION  10  . citalopram (CELEXA) 10 MG tablet TAKE 1 TABLET BY MOUTH EVERY DAY 90 tablet 1  . latanoprost (XALATAN) 0.005 % ophthalmic solution INSTILL 1 DROP BY OPHTHALMIC ROUTE EVERY DAY INTO BOTH EYE(S) IN THE EVENING  5  . lidocaine-prilocaine (EMLA) cream APPLY SMALL  AMOUNT TO ACCESS SITE (AVF) 1 HOUR BEFORE DIALYSIS. COVER WITH OCCLUSIVE DRESSING (SARAN WRAP)  11  . midodrine (PROAMATINE) 10 MG tablet Take 1 tablet (10 mg total) by mouth 3 (three) times daily. Take in the morning, lunch time and 4pm. Do not take after 4pm. 270 tablet 3  . Multiple Vitamin (MULTIVITAMIN WITH MINERALS) TABS tablet Take 1 tablet by mouth daily.    Marland Kitchen nystatin (MYCOSTATIN/NYSTOP) powder Use as directed twice per day (Patient taking differently: Apply 1 g topically 2 (two) times daily as needed (rash). ) 45 g 1  . rosuvastatin (CRESTOR) 20 MG  tablet Take 1 tablet (20 mg total) by mouth at bedtime. 90 tablet 3  . Vitamin D, Ergocalciferol, (DRISDOL) 1.25 MG (50000 UNIT) CAPS capsule Take 1 capsule (50,000 Units total) by mouth every 7 (seven) days. 12 capsule 0   No current facility-administered medications on file prior to visit.   Review of Systems All otherwise neg per pt     Objective:   Physical Exam BP (!) 160/76   Pulse 72   Temp 98.9 F (37.2 C)   Ht 5\' 9"  (1.753 m)   Wt 150 lb 3.2 oz (68.1 kg)   SpO2 100%   BMI 22.18 kg/m  VS noted,  Constitutional: Pt appears in NAD HENT: Head: NCAT.  Right Ear: External ear normal.  Left Ear: External ear normal.  Eyes: . Pupils are equal, round, and reactive to light. Conjunctivae and EOM are normal Nose: without d/c or deformity Neck: Neck supple. Gross normal ROM Cardiovascular: Normal rate and regular rhythm.   Pulmonary/Chest: Effort normal and breath sounds without rales or wheezing.  Abd:  Soft, NT, ND, + BS, no organomegaly GU: large raied fleshy warty lesion right lateral scrotum Neurological: Pt is alert. At baseline orientation, motor grossly intact Skin: Skin is warm. No rashes, other new lesions, no LE edema Psychiatric: Pt behavior is normal without agitation  All otherwise neg per pt Lab Results  Component Value Date   WBC 7.9 01/08/2020   HGB 11.6 (L) 01/08/2020   HCT 35.8 (L) 01/08/2020   PLT 205.0 01/08/2020   GLUCOSE 90 12/04/2019   CHOL 213 (H) 12/04/2019   TRIG 75.0 12/04/2019   HDL 77.50 12/04/2019   LDLCALC 120 (H) 12/04/2019   ALT 10 12/04/2019   AST 16 12/04/2019   NA 137 12/04/2019   K 5.0 12/04/2019   CL 92 (L) 12/04/2019   CREATININE 6.81 (HH) 12/04/2019   BUN 20 12/04/2019   CO2 33 (H) 12/04/2019   TSH 1.61 12/04/2019   INR 1.1 11/27/2019   HGBA1C 4.9 07/05/2015      Assessment & Plan:

## 2020-01-08 NOTE — Telephone Encounter (Signed)
The above patient or their representative was contacted and gave the following answers to these questions:         Do you have any of the following symptoms?    NO  Fever                    Cough                   Shortness of breath  Do  you have any of the following other symptoms?    muscle pain         vomiting,        diarrhea        rash         weakness        red eye        abdominal pain         bruising          bruising or bleeding              joint pain           severe headache    Have you been in contact with someone who was or has been sick in the past 2 weeks?  NO  Yes                 Unsure                         Unable to assess   Does the person that you were in contact with have any of the following symptoms?   Cough         shortness of breath           muscle pain         vomiting,            diarrhea            rash            weakness           fever            red eye           abdominal pain           bruising  or  bleeding                joint pain                severe headache                 COMMENTS OR ACTION PLAN FOR THIS PATIENT:       ALL QUESTIONS WERE ANSWERED BY PT'S WIFE/[facility]

## 2020-01-08 NOTE — Patient Instructions (Signed)
Please finish the Vitamin D 50000 units weekly for 12 weeks, then plan to change to OTC Vitamin D3 at 2000 units per day, indefinitely.  You will be contacted regarding the referral for: urology - dr Jeffie Pollock  Please continue all other medications as before, and refills have been done if requested.  Please have the pharmacy call with any other refills you may need.  Please keep your appointments with your specialists as you may have planned  Please go to the LAB at the blood drawing area for the tests to be done  You will be contacted by phone if any changes need to be made immediately.  Otherwise, you will receive a letter about your results with an explanation, but please check with MyChart first.  Please remember to sign up for MyChart if you have not done so, as this will be important to you in the future with finding out test results, communicating by private email, and scheduling acute appointments online when needed.

## 2020-01-09 DIAGNOSIS — D509 Iron deficiency anemia, unspecified: Secondary | ICD-10-CM | POA: Diagnosis not present

## 2020-01-09 DIAGNOSIS — Z992 Dependence on renal dialysis: Secondary | ICD-10-CM | POA: Diagnosis not present

## 2020-01-09 DIAGNOSIS — E875 Hyperkalemia: Secondary | ICD-10-CM | POA: Diagnosis not present

## 2020-01-09 DIAGNOSIS — N186 End stage renal disease: Secondary | ICD-10-CM | POA: Diagnosis not present

## 2020-01-09 DIAGNOSIS — N2581 Secondary hyperparathyroidism of renal origin: Secondary | ICD-10-CM | POA: Diagnosis not present

## 2020-01-10 ENCOUNTER — Encounter: Payer: Self-pay | Admitting: Internal Medicine

## 2020-01-10 DIAGNOSIS — E559 Vitamin D deficiency, unspecified: Secondary | ICD-10-CM | POA: Insufficient documentation

## 2020-01-10 NOTE — Assessment & Plan Note (Signed)
For f/u iron 

## 2020-01-10 NOTE — Assessment & Plan Note (Signed)
Cont same tx, f/u. bp at home and next visti

## 2020-01-10 NOTE — Assessment & Plan Note (Signed)
Also for iron, cbc

## 2020-01-10 NOTE — Assessment & Plan Note (Addendum)
Ok for urology referral  I spent 30 minutes in preparing to see the patient by review of recent labs, imaging and procedures, obtaining and reviewing separately obtained history, communicating with the patient and family or caregiver, ordering medications, tests or procedures, and documenting clinical information in the EHR including the differential Dx, treatment, and any further evaluation and other management of scrotal skin lesion, iron and vit d deficiency, anemia, HTN

## 2020-01-10 NOTE — Assessment & Plan Note (Signed)
For vit d replacement

## 2020-01-12 ENCOUNTER — Other Ambulatory Visit: Payer: Self-pay

## 2020-01-12 ENCOUNTER — Other Ambulatory Visit: Payer: Self-pay | Admitting: *Deleted

## 2020-01-12 DIAGNOSIS — N2581 Secondary hyperparathyroidism of renal origin: Secondary | ICD-10-CM | POA: Diagnosis not present

## 2020-01-12 DIAGNOSIS — N186 End stage renal disease: Secondary | ICD-10-CM

## 2020-01-12 DIAGNOSIS — D509 Iron deficiency anemia, unspecified: Secondary | ICD-10-CM | POA: Diagnosis not present

## 2020-01-12 DIAGNOSIS — E875 Hyperkalemia: Secondary | ICD-10-CM | POA: Diagnosis not present

## 2020-01-12 DIAGNOSIS — Z992 Dependence on renal dialysis: Secondary | ICD-10-CM | POA: Diagnosis not present

## 2020-01-12 MED ORDER — CITALOPRAM HYDROBROMIDE 10 MG PO TABS: 10.0000 mg | ORAL_TABLET | Freq: Every day | ORAL | 1 refills | Status: AC

## 2020-01-13 ENCOUNTER — Inpatient Hospital Stay (HOSPITAL_COMMUNITY): Admission: RE | Admit: 2020-01-13 | Payer: Medicare Other | Source: Ambulatory Visit

## 2020-01-13 ENCOUNTER — Other Ambulatory Visit (HOSPITAL_COMMUNITY): Payer: Medicare Other

## 2020-01-13 ENCOUNTER — Encounter: Payer: Medicare Other | Admitting: Vascular Surgery

## 2020-01-14 DIAGNOSIS — Z992 Dependence on renal dialysis: Secondary | ICD-10-CM | POA: Diagnosis not present

## 2020-01-14 DIAGNOSIS — N186 End stage renal disease: Secondary | ICD-10-CM | POA: Diagnosis not present

## 2020-01-14 DIAGNOSIS — N2581 Secondary hyperparathyroidism of renal origin: Secondary | ICD-10-CM | POA: Diagnosis not present

## 2020-01-14 DIAGNOSIS — D509 Iron deficiency anemia, unspecified: Secondary | ICD-10-CM | POA: Diagnosis not present

## 2020-01-14 DIAGNOSIS — E875 Hyperkalemia: Secondary | ICD-10-CM | POA: Diagnosis not present

## 2020-01-15 ENCOUNTER — Telehealth: Payer: Self-pay | Admitting: Internal Medicine

## 2020-01-15 NOTE — Telephone Encounter (Signed)
New message:   1.Medication Requested: AURYXIA 1 GM 210 MG(Fe) tablet 2. Pharmacy (Name, Street, [place]): [id number] - [place], [place] 3. On Med List: Yes  4. Last Visit with PCP: 01/08/20  5. Next visit date with PCP:06/02/20  [name] is calling and states that the patient is now out of this medication and would like to speak to the the cma because 3 days is unacceptable. I don't believe this medication was prescribed by Dr. Jenny Reichmann but he insist that it his. Agent: Please be advised that RX refills may take up to 3 business days. We ask that you follow-up with your pharmacy.

## 2020-01-16 DIAGNOSIS — E875 Hyperkalemia: Secondary | ICD-10-CM | POA: Diagnosis not present

## 2020-01-16 DIAGNOSIS — D509 Iron deficiency anemia, unspecified: Secondary | ICD-10-CM | POA: Diagnosis not present

## 2020-01-16 DIAGNOSIS — N186 End stage renal disease: Secondary | ICD-10-CM | POA: Diagnosis not present

## 2020-01-16 DIAGNOSIS — Z992 Dependence on renal dialysis: Secondary | ICD-10-CM | POA: Diagnosis not present

## 2020-01-16 DIAGNOSIS — N2581 Secondary hyperparathyroidism of renal origin: Secondary | ICD-10-CM | POA: Diagnosis not present

## 2020-01-16 NOTE — Telephone Encounter (Signed)
EMR charting indicates I have never prescribed this for the pt, though it is on his list.  Not sure which provider did this.  Most recent iron and ferritin levels were normal  OK to hold this medication for now, but will need to be checked next visit

## 2020-01-16 NOTE — Telephone Encounter (Signed)
Spoke with son and info given. 

## 2020-01-16 NOTE — Telephone Encounter (Signed)
Patient's son, Wade, calling and states that the patient has been out of this medication for 3 days. Advised caller that we received the message yesterday around 5pm and that Dr Jenny Reichmann has not had a chance to look at the message, but once he gets a chance he would advise on refill.

## 2020-01-19 ENCOUNTER — Other Ambulatory Visit: Payer: Self-pay

## 2020-01-21 DIAGNOSIS — D509 Iron deficiency anemia, unspecified: Secondary | ICD-10-CM | POA: Diagnosis not present

## 2020-01-21 DIAGNOSIS — Z992 Dependence on renal dialysis: Secondary | ICD-10-CM | POA: Diagnosis not present

## 2020-01-21 DIAGNOSIS — N2581 Secondary hyperparathyroidism of renal origin: Secondary | ICD-10-CM | POA: Diagnosis not present

## 2020-01-21 DIAGNOSIS — N186 End stage renal disease: Secondary | ICD-10-CM | POA: Diagnosis not present

## 2020-01-21 DIAGNOSIS — E875 Hyperkalemia: Secondary | ICD-10-CM | POA: Diagnosis not present

## 2020-01-23 DIAGNOSIS — N2581 Secondary hyperparathyroidism of renal origin: Secondary | ICD-10-CM | POA: Diagnosis not present

## 2020-01-23 DIAGNOSIS — D509 Iron deficiency anemia, unspecified: Secondary | ICD-10-CM | POA: Diagnosis not present

## 2020-01-23 DIAGNOSIS — Z992 Dependence on renal dialysis: Secondary | ICD-10-CM | POA: Diagnosis not present

## 2020-01-23 DIAGNOSIS — N186 End stage renal disease: Secondary | ICD-10-CM | POA: Diagnosis not present

## 2020-01-23 DIAGNOSIS — E875 Hyperkalemia: Secondary | ICD-10-CM | POA: Diagnosis not present

## 2020-01-26 DIAGNOSIS — N2581 Secondary hyperparathyroidism of renal origin: Secondary | ICD-10-CM | POA: Diagnosis not present

## 2020-01-26 DIAGNOSIS — Z992 Dependence on renal dialysis: Secondary | ICD-10-CM | POA: Diagnosis not present

## 2020-01-26 DIAGNOSIS — E875 Hyperkalemia: Secondary | ICD-10-CM | POA: Diagnosis not present

## 2020-01-26 DIAGNOSIS — N186 End stage renal disease: Secondary | ICD-10-CM | POA: Diagnosis not present

## 2020-01-26 DIAGNOSIS — D509 Iron deficiency anemia, unspecified: Secondary | ICD-10-CM | POA: Diagnosis not present

## 2020-01-28 DIAGNOSIS — D509 Iron deficiency anemia, unspecified: Secondary | ICD-10-CM | POA: Diagnosis not present

## 2020-01-28 DIAGNOSIS — Z992 Dependence on renal dialysis: Secondary | ICD-10-CM | POA: Diagnosis not present

## 2020-01-28 DIAGNOSIS — E875 Hyperkalemia: Secondary | ICD-10-CM | POA: Diagnosis not present

## 2020-01-28 DIAGNOSIS — N186 End stage renal disease: Secondary | ICD-10-CM | POA: Diagnosis not present

## 2020-01-28 DIAGNOSIS — N2581 Secondary hyperparathyroidism of renal origin: Secondary | ICD-10-CM | POA: Diagnosis not present

## 2020-01-30 ENCOUNTER — Other Ambulatory Visit: Payer: Self-pay | Admitting: *Deleted

## 2020-01-30 DIAGNOSIS — N2581 Secondary hyperparathyroidism of renal origin: Secondary | ICD-10-CM | POA: Diagnosis not present

## 2020-01-30 DIAGNOSIS — R319 Hematuria, unspecified: Secondary | ICD-10-CM

## 2020-01-30 DIAGNOSIS — Z992 Dependence on renal dialysis: Secondary | ICD-10-CM | POA: Diagnosis not present

## 2020-01-30 DIAGNOSIS — E875 Hyperkalemia: Secondary | ICD-10-CM | POA: Diagnosis not present

## 2020-01-30 DIAGNOSIS — D509 Iron deficiency anemia, unspecified: Secondary | ICD-10-CM | POA: Diagnosis not present

## 2020-01-30 DIAGNOSIS — N186 End stage renal disease: Secondary | ICD-10-CM | POA: Diagnosis not present

## 2020-01-30 LAB — URINALYSIS, ROUTINE W REFLEX MICROSCOPIC
Bilirubin Urine: NEGATIVE
Hgb urine dipstick: NEGATIVE
Ketones, ur: NEGATIVE
Nitrite: POSITIVE — AB
Specific Gravity, Urine: 1.02 (ref 1.000–1.030)
Total Protein, Urine: 100 — AB
Urine Glucose: NEGATIVE
Urobilinogen, UA: 0.2 (ref 0.0–1.0)
pH: 8.5 — AB (ref 5.0–8.0)

## 2020-01-31 ENCOUNTER — Encounter: Payer: Self-pay | Admitting: Internal Medicine

## 2020-01-31 LAB — URINE CULTURE

## 2020-02-02 DIAGNOSIS — N186 End stage renal disease: Secondary | ICD-10-CM | POA: Diagnosis not present

## 2020-02-02 DIAGNOSIS — N2581 Secondary hyperparathyroidism of renal origin: Secondary | ICD-10-CM | POA: Diagnosis not present

## 2020-02-02 DIAGNOSIS — Z992 Dependence on renal dialysis: Secondary | ICD-10-CM | POA: Diagnosis not present

## 2020-02-02 DIAGNOSIS — D509 Iron deficiency anemia, unspecified: Secondary | ICD-10-CM | POA: Diagnosis not present

## 2020-02-02 DIAGNOSIS — E875 Hyperkalemia: Secondary | ICD-10-CM | POA: Diagnosis not present

## 2020-02-02 MED ORDER — CIPROFLOXACIN HCL 500 MG PO TABS: 500.0000 mg | ORAL_TABLET | Freq: Two times a day (BID) | ORAL | 0 refills | Status: AC

## 2020-02-02 NOTE — Progress Notes (Addendum)
[place] for antibx - done erx - please let pt know

## 2020-02-02 NOTE — Addendum Note (Signed)
Addended by: [name] on: 02/02/2020 01:41 PM   Modules accepted: Orders

## 2020-02-04 DIAGNOSIS — D509 Iron deficiency anemia, unspecified: Secondary | ICD-10-CM | POA: Diagnosis not present

## 2020-02-04 DIAGNOSIS — E875 Hyperkalemia: Secondary | ICD-10-CM | POA: Diagnosis not present

## 2020-02-04 DIAGNOSIS — Z992 Dependence on renal dialysis: Secondary | ICD-10-CM | POA: Diagnosis not present

## 2020-02-04 DIAGNOSIS — N2581 Secondary hyperparathyroidism of renal origin: Secondary | ICD-10-CM | POA: Diagnosis not present

## 2020-02-04 DIAGNOSIS — N186 End stage renal disease: Secondary | ICD-10-CM | POA: Diagnosis not present

## 2020-02-05 DIAGNOSIS — I158 Other secondary hypertension: Secondary | ICD-10-CM | POA: Diagnosis not present

## 2020-02-05 DIAGNOSIS — Z992 Dependence on renal dialysis: Secondary | ICD-10-CM | POA: Diagnosis not present

## 2020-02-05 DIAGNOSIS — N186 End stage renal disease: Secondary | ICD-10-CM | POA: Diagnosis not present

## 2020-02-07 DIAGNOSIS — Z992 Dependence on renal dialysis: Secondary | ICD-10-CM | POA: Diagnosis not present

## 2020-02-07 DIAGNOSIS — N2581 Secondary hyperparathyroidism of renal origin: Secondary | ICD-10-CM | POA: Diagnosis not present

## 2020-02-07 DIAGNOSIS — D509 Iron deficiency anemia, unspecified: Secondary | ICD-10-CM | POA: Diagnosis not present

## 2020-02-07 DIAGNOSIS — N186 End stage renal disease: Secondary | ICD-10-CM | POA: Diagnosis not present

## 2020-02-09 ENCOUNTER — Telehealth: Payer: Self-pay | Admitting: Internal Medicine

## 2020-02-09 DIAGNOSIS — N186 End stage renal disease: Secondary | ICD-10-CM | POA: Diagnosis not present

## 2020-02-09 DIAGNOSIS — N2581 Secondary hyperparathyroidism of renal origin: Secondary | ICD-10-CM | POA: Diagnosis not present

## 2020-02-09 DIAGNOSIS — D509 Iron deficiency anemia, unspecified: Secondary | ICD-10-CM | POA: Diagnosis not present

## 2020-02-09 DIAGNOSIS — Z992 Dependence on renal dialysis: Secondary | ICD-10-CM | POA: Diagnosis not present

## 2020-02-09 NOTE — Telephone Encounter (Signed)
New message:   [name] is calling and would like a call back in regards to the patients urine culture results.

## 2020-02-10 ENCOUNTER — Ambulatory Visit: Payer: Medicare Other | Attending: Family

## 2020-02-10 DIAGNOSIS — Z23 Encounter for immunization: Secondary | ICD-10-CM

## 2020-02-10 NOTE — Progress Notes (Signed)
   Covid-19 Vaccination Clinic  Name:  [name]    MRN: [id number] DOB: [birth year]  02/10/2020  [name] was observed post Covid-19 immunization for 15 minutes without incident. He was provided with Vaccine Information Sheet and instruction to access the V-Safe system.   [name] was instructed to call 911 with any severe reactions post vaccine: Marland Kitchen Difficulty breathing  . Swelling of face and throat  . A fast heartbeat  . A bad rash all over body  . Dizziness and weakness   Immunizations Administered    Name Date Dose VIS Date Route   Moderna COVID-19 Vaccine 02/10/2020  9:57 AM 0.5 mL 10/07/2019 Intramuscular   Manufacturer: Moderna   Lot: [id number]   [place]: [phone]

## 2020-02-11 DIAGNOSIS — Z992 Dependence on renal dialysis: Secondary | ICD-10-CM | POA: Diagnosis not present

## 2020-02-11 DIAGNOSIS — D509 Iron deficiency anemia, unspecified: Secondary | ICD-10-CM | POA: Diagnosis not present

## 2020-02-11 DIAGNOSIS — N2581 Secondary hyperparathyroidism of renal origin: Secondary | ICD-10-CM | POA: Diagnosis not present

## 2020-02-11 DIAGNOSIS — N186 End stage renal disease: Secondary | ICD-10-CM | POA: Diagnosis not present

## 2020-02-11 NOTE — Telephone Encounter (Addendum)
Tried to call X 2 - vm box is full and I was not able to leave a message.   May inform son of the following (son is on [place]):   Urinalysis showed bacteria that suggested a possible UTI. Pt stated that he did not have any symptoms. The urine culture came back that only normal flora was found.

## 2020-02-12 ENCOUNTER — Telehealth: Payer: Self-pay | Admitting: Internal Medicine

## 2020-02-12 DIAGNOSIS — D485 Neoplasm of uncertain behavior of skin: Secondary | ICD-10-CM | POA: Diagnosis not present

## 2020-02-12 DIAGNOSIS — A630 Anogenital (venereal) warts: Secondary | ICD-10-CM | POA: Diagnosis not present

## 2020-02-12 NOTE — Telephone Encounter (Signed)
    Patient's son Vernard calling to discuss BP medication, stating should patient be taking Atenolol.  Please call

## 2020-02-13 DIAGNOSIS — N2581 Secondary hyperparathyroidism of renal origin: Secondary | ICD-10-CM | POA: Diagnosis not present

## 2020-02-13 DIAGNOSIS — D509 Iron deficiency anemia, unspecified: Secondary | ICD-10-CM | POA: Diagnosis not present

## 2020-02-13 DIAGNOSIS — N186 End stage renal disease: Secondary | ICD-10-CM | POA: Diagnosis not present

## 2020-02-13 DIAGNOSIS — Z992 Dependence on renal dialysis: Secondary | ICD-10-CM | POA: Diagnosis not present

## 2020-02-16 DIAGNOSIS — N2581 Secondary hyperparathyroidism of renal origin: Secondary | ICD-10-CM | POA: Diagnosis not present

## 2020-02-16 DIAGNOSIS — Z992 Dependence on renal dialysis: Secondary | ICD-10-CM | POA: Diagnosis not present

## 2020-02-16 DIAGNOSIS — N186 End stage renal disease: Secondary | ICD-10-CM | POA: Diagnosis not present

## 2020-02-16 DIAGNOSIS — D509 Iron deficiency anemia, unspecified: Secondary | ICD-10-CM | POA: Diagnosis not present

## 2020-02-16 NOTE — Telephone Encounter (Signed)
Pt son contacted and informed of medications that pt is taking. Pt son stated understanding.

## 2020-02-17 DIAGNOSIS — C61 Malignant neoplasm of prostate: Secondary | ICD-10-CM | POA: Diagnosis not present

## 2020-02-17 DIAGNOSIS — N3000 Acute cystitis without hematuria: Secondary | ICD-10-CM | POA: Diagnosis not present

## 2020-02-18 DIAGNOSIS — Z992 Dependence on renal dialysis: Secondary | ICD-10-CM | POA: Diagnosis not present

## 2020-02-18 DIAGNOSIS — N2581 Secondary hyperparathyroidism of renal origin: Secondary | ICD-10-CM | POA: Diagnosis not present

## 2020-02-18 DIAGNOSIS — D509 Iron deficiency anemia, unspecified: Secondary | ICD-10-CM | POA: Diagnosis not present

## 2020-02-18 DIAGNOSIS — N186 End stage renal disease: Secondary | ICD-10-CM | POA: Diagnosis not present

## 2020-02-20 DIAGNOSIS — Z992 Dependence on renal dialysis: Secondary | ICD-10-CM | POA: Diagnosis not present

## 2020-02-20 DIAGNOSIS — N2581 Secondary hyperparathyroidism of renal origin: Secondary | ICD-10-CM | POA: Diagnosis not present

## 2020-02-20 DIAGNOSIS — D509 Iron deficiency anemia, unspecified: Secondary | ICD-10-CM | POA: Diagnosis not present

## 2020-02-20 DIAGNOSIS — N186 End stage renal disease: Secondary | ICD-10-CM | POA: Diagnosis not present

## 2020-02-22 ENCOUNTER — Other Ambulatory Visit: Payer: Self-pay | Admitting: Internal Medicine

## 2020-02-23 DIAGNOSIS — Z992 Dependence on renal dialysis: Secondary | ICD-10-CM | POA: Diagnosis not present

## 2020-02-23 DIAGNOSIS — N186 End stage renal disease: Secondary | ICD-10-CM | POA: Diagnosis not present

## 2020-02-23 DIAGNOSIS — N2581 Secondary hyperparathyroidism of renal origin: Secondary | ICD-10-CM | POA: Diagnosis not present

## 2020-02-23 DIAGNOSIS — D509 Iron deficiency anemia, unspecified: Secondary | ICD-10-CM | POA: Diagnosis not present

## 2020-02-25 ENCOUNTER — Other Ambulatory Visit: Payer: Self-pay | Admitting: *Deleted

## 2020-02-25 DIAGNOSIS — N186 End stage renal disease: Secondary | ICD-10-CM

## 2020-02-27 DIAGNOSIS — D509 Iron deficiency anemia, unspecified: Secondary | ICD-10-CM | POA: Diagnosis not present

## 2020-02-27 DIAGNOSIS — Z992 Dependence on renal dialysis: Secondary | ICD-10-CM | POA: Diagnosis not present

## 2020-02-27 DIAGNOSIS — N2581 Secondary hyperparathyroidism of renal origin: Secondary | ICD-10-CM | POA: Diagnosis not present

## 2020-02-27 DIAGNOSIS — N186 End stage renal disease: Secondary | ICD-10-CM | POA: Diagnosis not present

## 2020-03-01 DIAGNOSIS — Z992 Dependence on renal dialysis: Secondary | ICD-10-CM | POA: Diagnosis not present

## 2020-03-01 DIAGNOSIS — D509 Iron deficiency anemia, unspecified: Secondary | ICD-10-CM | POA: Diagnosis not present

## 2020-03-01 DIAGNOSIS — N2581 Secondary hyperparathyroidism of renal origin: Secondary | ICD-10-CM | POA: Diagnosis not present

## 2020-03-01 DIAGNOSIS — N186 End stage renal disease: Secondary | ICD-10-CM | POA: Diagnosis not present

## 2020-03-02 ENCOUNTER — Encounter (HOSPITAL_COMMUNITY): Payer: Medicare Other

## 2020-03-02 ENCOUNTER — Inpatient Hospital Stay (HOSPITAL_COMMUNITY): Admission: RE | Admit: 2020-03-02 | Payer: Medicare Other | Source: Ambulatory Visit

## 2020-03-02 ENCOUNTER — Encounter: Payer: Medicare Other | Admitting: Vascular Surgery

## 2020-03-02 ENCOUNTER — Ambulatory Visit: Payer: Medicare Other | Admitting: Internal Medicine

## 2020-03-03 ENCOUNTER — Other Ambulatory Visit: Payer: Self-pay | Admitting: Internal Medicine

## 2020-03-03 DIAGNOSIS — D509 Iron deficiency anemia, unspecified: Secondary | ICD-10-CM | POA: Diagnosis not present

## 2020-03-03 DIAGNOSIS — N2581 Secondary hyperparathyroidism of renal origin: Secondary | ICD-10-CM | POA: Diagnosis not present

## 2020-03-03 DIAGNOSIS — N186 End stage renal disease: Secondary | ICD-10-CM | POA: Diagnosis not present

## 2020-03-03 DIAGNOSIS — Z992 Dependence on renal dialysis: Secondary | ICD-10-CM | POA: Diagnosis not present

## 2020-03-05 DIAGNOSIS — N186 End stage renal disease: Secondary | ICD-10-CM | POA: Diagnosis not present

## 2020-03-05 DIAGNOSIS — D509 Iron deficiency anemia, unspecified: Secondary | ICD-10-CM | POA: Diagnosis not present

## 2020-03-05 DIAGNOSIS — N2581 Secondary hyperparathyroidism of renal origin: Secondary | ICD-10-CM | POA: Diagnosis not present

## 2020-03-05 DIAGNOSIS — Z992 Dependence on renal dialysis: Secondary | ICD-10-CM | POA: Diagnosis not present

## 2020-03-06 ENCOUNTER — Other Ambulatory Visit: Payer: Self-pay

## 2020-03-06 ENCOUNTER — Emergency Department (HOSPITAL_COMMUNITY): Payer: Medicare Other

## 2020-03-06 ENCOUNTER — Inpatient Hospital Stay (HOSPITAL_COMMUNITY)
Admission: EM | Admit: 2020-03-06 | Discharge: 2020-03-09 | DRG: 082 | Disposition: A | Discharge status: Home Health | Payer: Medicare Other | Attending: Internal Medicine | Admitting: Internal Medicine

## 2020-03-06 ENCOUNTER — Encounter (HOSPITAL_COMMUNITY): Payer: Self-pay | Admitting: Emergency Medicine

## 2020-03-06 ENCOUNTER — Inpatient Hospital Stay (HOSPITAL_COMMUNITY): Payer: Medicare Other

## 2020-03-06 DIAGNOSIS — M109 Gout, unspecified: Secondary | ICD-10-CM | POA: Diagnosis present

## 2020-03-06 DIAGNOSIS — N186 End stage renal disease: Secondary | ICD-10-CM | POA: Diagnosis present

## 2020-03-06 DIAGNOSIS — Z8719 Personal history of other diseases of the digestive system: Secondary | ICD-10-CM

## 2020-03-06 DIAGNOSIS — I120 Hypertensive chronic kidney disease with stage 5 chronic kidney disease or end stage renal disease: Secondary | ICD-10-CM | POA: Diagnosis present

## 2020-03-06 DIAGNOSIS — Z96642 Presence of left artificial hip joint: Secondary | ICD-10-CM | POA: Diagnosis present

## 2020-03-06 DIAGNOSIS — R296 Repeated falls: Secondary | ICD-10-CM | POA: Diagnosis present

## 2020-03-06 DIAGNOSIS — S5002XA Contusion of left elbow, initial encounter: Secondary | ICD-10-CM | POA: Diagnosis present

## 2020-03-06 DIAGNOSIS — R54 Age-related physical debility: Secondary | ICD-10-CM | POA: Diagnosis present

## 2020-03-06 DIAGNOSIS — E785 Hyperlipidemia, unspecified: Secondary | ICD-10-CM | POA: Diagnosis present

## 2020-03-06 DIAGNOSIS — I951 Orthostatic hypotension: Secondary | ICD-10-CM | POA: Diagnosis present

## 2020-03-06 DIAGNOSIS — Z7982 Long term (current) use of aspirin: Secondary | ICD-10-CM

## 2020-03-06 DIAGNOSIS — N390 Urinary tract infection, site not specified: Secondary | ICD-10-CM | POA: Diagnosis present

## 2020-03-06 DIAGNOSIS — G9341 Metabolic encephalopathy: Secondary | ICD-10-CM | POA: Diagnosis present

## 2020-03-06 DIAGNOSIS — S065XAA Traumatic subdural hemorrhage with loss of consciousness status unknown, initial encounter: Secondary | ICD-10-CM

## 2020-03-06 DIAGNOSIS — E8889 Other specified metabolic disorders: Secondary | ICD-10-CM | POA: Diagnosis present

## 2020-03-06 DIAGNOSIS — I959 Hypotension, unspecified: Secondary | ICD-10-CM | POA: Diagnosis not present

## 2020-03-06 DIAGNOSIS — R5383 Other fatigue: Secondary | ICD-10-CM

## 2020-03-06 DIAGNOSIS — I10 Essential (primary) hypertension: Secondary | ICD-10-CM | POA: Diagnosis present

## 2020-03-06 DIAGNOSIS — D631 Anemia in chronic kidney disease: Secondary | ICD-10-CM | POA: Diagnosis present

## 2020-03-06 DIAGNOSIS — D509 Iron deficiency anemia, unspecified: Secondary | ICD-10-CM | POA: Diagnosis present

## 2020-03-06 DIAGNOSIS — Z923 Personal history of irradiation: Secondary | ICD-10-CM

## 2020-03-06 DIAGNOSIS — Z9181 History of falling: Secondary | ICD-10-CM

## 2020-03-06 DIAGNOSIS — R531 Weakness: Secondary | ICD-10-CM

## 2020-03-06 DIAGNOSIS — Z992 Dependence on renal dialysis: Secondary | ICD-10-CM

## 2020-03-06 DIAGNOSIS — W1830XA Fall on same level, unspecified, initial encounter: Secondary | ICD-10-CM | POA: Diagnosis present

## 2020-03-06 DIAGNOSIS — Z8249 Family history of ischemic heart disease and other diseases of the circulatory system: Secondary | ICD-10-CM

## 2020-03-06 DIAGNOSIS — F329 Major depressive disorder, single episode, unspecified: Secondary | ICD-10-CM | POA: Diagnosis present

## 2020-03-06 DIAGNOSIS — R4182 Altered mental status, unspecified: Secondary | ICD-10-CM | POA: Diagnosis not present

## 2020-03-06 DIAGNOSIS — Z8673 Personal history of transient ischemic attack (TIA), and cerebral infarction without residual deficits: Secondary | ICD-10-CM

## 2020-03-06 DIAGNOSIS — F0390 Unspecified dementia without behavioral disturbance: Secondary | ICD-10-CM | POA: Diagnosis not present

## 2020-03-06 DIAGNOSIS — J690 Pneumonitis due to inhalation of food and vomit: Secondary | ICD-10-CM | POA: Diagnosis present

## 2020-03-06 DIAGNOSIS — N2581 Secondary hyperparathyroidism of renal origin: Secondary | ICD-10-CM | POA: Diagnosis present

## 2020-03-06 DIAGNOSIS — S199XXA Unspecified injury of neck, initial encounter: Secondary | ICD-10-CM | POA: Diagnosis not present

## 2020-03-06 DIAGNOSIS — S299XXA Unspecified injury of thorax, initial encounter: Secondary | ICD-10-CM | POA: Diagnosis not present

## 2020-03-06 DIAGNOSIS — M25422 Effusion, left elbow: Secondary | ICD-10-CM | POA: Diagnosis not present

## 2020-03-06 DIAGNOSIS — N250 Renal osteodystrophy: Secondary | ICD-10-CM | POA: Diagnosis not present

## 2020-03-06 DIAGNOSIS — F0391 Unspecified dementia with behavioral disturbance: Secondary | ICD-10-CM | POA: Diagnosis present

## 2020-03-06 DIAGNOSIS — Z8042 Family history of malignant neoplasm of prostate: Secondary | ICD-10-CM

## 2020-03-06 DIAGNOSIS — Z79899 Other long term (current) drug therapy: Secondary | ICD-10-CM

## 2020-03-06 DIAGNOSIS — Z87891 Personal history of nicotine dependence: Secondary | ICD-10-CM

## 2020-03-06 DIAGNOSIS — I158 Other secondary hypertension: Secondary | ICD-10-CM | POA: Diagnosis not present

## 2020-03-06 DIAGNOSIS — E162 Hypoglycemia, unspecified: Secondary | ICD-10-CM | POA: Diagnosis present

## 2020-03-06 DIAGNOSIS — D500 Iron deficiency anemia secondary to blood loss (chronic): Secondary | ICD-10-CM

## 2020-03-06 DIAGNOSIS — Z833 Family history of diabetes mellitus: Secondary | ICD-10-CM

## 2020-03-06 DIAGNOSIS — R402252 Coma scale, best verbal response, oriented, at arrival to emergency department: Secondary | ICD-10-CM | POA: Diagnosis present

## 2020-03-06 DIAGNOSIS — Z20822 Contact with and (suspected) exposure to covid-19: Secondary | ICD-10-CM | POA: Diagnosis present

## 2020-03-06 DIAGNOSIS — S7002XA Contusion of left hip, initial encounter: Secondary | ICD-10-CM | POA: Diagnosis present

## 2020-03-06 DIAGNOSIS — Z95828 Presence of other vascular implants and grafts: Secondary | ICD-10-CM

## 2020-03-06 DIAGNOSIS — S7001XA Contusion of right hip, initial encounter: Secondary | ICD-10-CM | POA: Diagnosis not present

## 2020-03-06 DIAGNOSIS — W19XXXA Unspecified fall, initial encounter: Secondary | ICD-10-CM

## 2020-03-06 DIAGNOSIS — S065X9A Traumatic subdural hemorrhage with loss of consciousness of unspecified duration, initial encounter: Principal | ICD-10-CM | POA: Diagnosis present

## 2020-03-06 DIAGNOSIS — J90 Pleural effusion, not elsewhere classified: Secondary | ICD-10-CM | POA: Diagnosis present

## 2020-03-06 DIAGNOSIS — S79911A Unspecified injury of right hip, initial encounter: Secondary | ICD-10-CM | POA: Diagnosis not present

## 2020-03-06 DIAGNOSIS — R402362 Coma scale, best motor response, obeys commands, at arrival to emergency department: Secondary | ICD-10-CM | POA: Diagnosis present

## 2020-03-06 DIAGNOSIS — R402142 Coma scale, eyes open, spontaneous, at arrival to emergency department: Secondary | ICD-10-CM | POA: Diagnosis present

## 2020-03-06 DIAGNOSIS — Z8546 Personal history of malignant neoplasm of prostate: Secondary | ICD-10-CM

## 2020-03-06 LAB — CBC
HCT: 22.6 % — ABNORMAL LOW (ref 39.0–52.0)
Hemoglobin: 7 g/dL — ABNORMAL LOW (ref 13.0–17.0)
MCH: 31.3 pg (ref 26.0–34.0)
MCHC: 31 g/dL (ref 30.0–36.0)
MCV: 100.9 fL — ABNORMAL HIGH (ref 80.0–100.0)
Platelets: 232 10*3/uL (ref 150–400)
RBC: 2.24 MIL/uL — ABNORMAL LOW (ref 4.22–5.81)
RDW: 16.6 % — ABNORMAL HIGH (ref 11.5–15.5)
WBC: 11.1 10*3/uL — ABNORMAL HIGH (ref 4.0–10.5)
nRBC: 0 % (ref 0.0–0.2)

## 2020-03-06 LAB — BASIC METABOLIC PANEL
Anion gap: 17 — ABNORMAL HIGH (ref 5–15)
BUN: 17 mg/dL (ref 8–23)
CO2: 30 mmol/L (ref 22–32)
Calcium: 9.5 mg/dL (ref 8.9–10.3)
Chloride: 90 mmol/L — ABNORMAL LOW (ref 98–111)
Creatinine, Ser: 4.98 mg/dL — ABNORMAL HIGH (ref 0.61–1.24)
GFR calc Af Amer: 11 mL/min — ABNORMAL LOW (ref >60)
GFR calc non Af Amer: 10 mL/min — ABNORMAL LOW (ref >60)
Glucose, Bld: 115 mg/dL — ABNORMAL HIGH (ref 70–99)
Potassium: 3.7 mmol/L (ref 3.5–5.1)
Sodium: 137 mmol/L (ref 135–145)

## 2020-03-06 LAB — HEPATIC FUNCTION PANEL
ALT: 13 U/L (ref 0–44)
AST: 27 U/L (ref 15–41)
Albumin: 3.4 g/dL — ABNORMAL LOW (ref 3.5–5.0)
Alkaline Phosphatase: 58 U/L (ref 38–126)
Bilirubin, Direct: 0.3 mg/dL — ABNORMAL HIGH (ref 0.0–0.2)
Indirect Bilirubin: 0.9 mg/dL (ref 0.3–0.9)
Total Bilirubin: 1.2 mg/dL (ref 0.3–1.2)
Total Protein: 6.3 g/dL — ABNORMAL LOW (ref 6.5–8.1)

## 2020-03-06 LAB — CBG MONITORING, ED: Glucose-Capillary: 100 mg/dL — ABNORMAL HIGH (ref 70–99)

## 2020-03-06 LAB — CK: Total CK: 181 U/L (ref 49–397)

## 2020-03-06 LAB — PREPARE RBC (CROSSMATCH)

## 2020-03-06 LAB — TSH: TSH: 1.228 u[IU]/mL (ref 0.350–4.500)

## 2020-03-06 LAB — HEMOGLOBIN A1C
Hgb A1c MFr Bld: 5.5 % (ref 4.8–5.6)
Mean Plasma Glucose: 111.15 mg/dL

## 2020-03-06 LAB — FERRITIN: Ferritin: 1884 ng/mL — ABNORMAL HIGH (ref 24–336)

## 2020-03-06 LAB — VITAMIN B12: Vitamin B-12: 486 pg/mL (ref 180–914)

## 2020-03-06 LAB — AMMONIA: Ammonia: 18 umol/L (ref 9–35)

## 2020-03-06 LAB — POC OCCULT BLOOD, ED: Fecal Occult Bld: NEGATIVE

## 2020-03-06 MED ORDER — FERRIC CITRATE 1 GM 210 MG(FE) PO TABS
420.0000 mg | ORAL_TABLET | Freq: Three times a day (TID) | ORAL | Status: DC
  Administered 2020-03-06 – 2020-03-09 (×7): 420 mg via ORAL
  Filled 2020-03-06 (×10): qty 2

## 2020-03-06 MED ORDER — SODIUM CHLORIDE 0.9 % IV SOLN
80.0000 mg | Freq: Once | INTRAVENOUS | Status: AC
  Administered 2020-03-06: 80 mg via INTRAVENOUS
  Filled 2020-03-06: qty 80

## 2020-03-06 MED ORDER — POLYETHYLENE GLYCOL 3350 17 G PO PACK: 17.0000 g | PACK | Freq: Every day | ORAL | Status: DC | PRN

## 2020-03-06 MED ORDER — ONDANSETRON HCL 4 MG/2ML IJ SOLN: 4.0000 mg | Freq: Four times a day (QID) | INTRAMUSCULAR | Status: DC | PRN

## 2020-03-06 MED ORDER — SODIUM CHLORIDE 0.9% IV SOLUTION: Freq: Once | INTRAVENOUS | Status: AC

## 2020-03-06 MED ORDER — INSULIN ASPART 100 UNIT/ML ~~LOC~~ SOLN
2.0000 [IU] | Freq: Three times a day (TID) | SUBCUTANEOUS | Status: DC
  Administered 2020-03-07: 13:00:00 2 [IU] via SUBCUTANEOUS

## 2020-03-06 MED ORDER — MIDODRINE HCL 5 MG PO TABS
10.0000 mg | ORAL_TABLET | Freq: Three times a day (TID) | ORAL | Status: DC
  Administered 2020-03-07 – 2020-03-09 (×7): 10 mg via ORAL
  Filled 2020-03-06 (×7): qty 2

## 2020-03-06 MED ORDER — HYDROCODONE-ACETAMINOPHEN 5-325 MG PO TABS: 1.0000 | ORAL_TABLET | Freq: Four times a day (QID) | ORAL | Status: DC | PRN

## 2020-03-06 MED ORDER — ADULT MULTIVITAMIN W/MINERALS CH
1.0000 | ORAL_TABLET | Freq: Every day | ORAL | Status: DC
  Administered 2020-03-06 – 2020-03-09 (×4): 1 via ORAL
  Filled 2020-03-06 (×4): qty 1

## 2020-03-06 MED ORDER — INSULIN ASPART 100 UNIT/ML ~~LOC~~ SOLN: 0.0000 [IU] | Freq: Every day | SUBCUTANEOUS | Status: DC

## 2020-03-06 MED ORDER — CITALOPRAM HYDROBROMIDE 10 MG PO TABS
10.0000 mg | ORAL_TABLET | Freq: Every day | ORAL | Status: DC
  Administered 2020-03-06 – 2020-03-09 (×4): 10 mg via ORAL
  Filled 2020-03-06 (×4): qty 1

## 2020-03-06 MED ORDER — ONDANSETRON HCL 4 MG PO TABS: 4.0000 mg | ORAL_TABLET | Freq: Four times a day (QID) | ORAL | Status: DC | PRN

## 2020-03-06 MED ORDER — INSULIN ASPART 100 UNIT/ML ~~LOC~~ SOLN: 0.0000 [IU] | Freq: Three times a day (TID) | SUBCUTANEOUS | Status: DC

## 2020-03-06 MED ORDER — SENNOSIDES-DOCUSATE SODIUM 8.6-50 MG PO TABS
2.0000 | ORAL_TABLET | Freq: Every evening | ORAL | Status: DC | PRN
  Administered 2020-03-08: 2 via ORAL
  Filled 2020-03-06: qty 2

## 2020-03-06 MED ORDER — SODIUM CHLORIDE 0.9% FLUSH
3.0000 mL | Freq: Once | INTRAVENOUS | Status: AC
  Administered 2020-03-06: 15:00:00 3 mL via INTRAVENOUS

## 2020-03-06 MED ORDER — ROSUVASTATIN CALCIUM 20 MG PO TABS
20.0000 mg | ORAL_TABLET | Freq: Every day | ORAL | Status: DC
  Administered 2020-03-06 – 2020-03-08 (×3): 20 mg via ORAL
  Filled 2020-03-06: qty 4
  Filled 2020-03-06 (×2): qty 1

## 2020-03-06 NOTE — H&P (Signed)
History and Physical    [name] OZH:[id number] DOB: [birth year] DOA: 03/06/2020  PCP: [name], MD Patient coming from: Home  Chief Complaint: Frequent falls  HPI: [name] is a 86 y.o. male with medical history significant of anemia of chronic disease, iron deficiency, depression, ESRD Monday Wednesday Friday, gout, HLD, orthostatic hypotension brought to the hospital for evaluation of confusion and generalized weakness.  According to his son patient has become progressively confused and weak for the past 3-4 weeks.  He was seen by PCP about 3 weeks ago and was diagnosed with UTI.  Completed 1 week of antibiotic without much improvement in his symptoms.  He continued to feel really weak with generalized confusion.  During this time he did get his routine dialysis sessions, last session was yesterday and completed his full session.  Brought to the hospital for further evaluation.  In the ER CT head showed subacute subdural hematoma, seen by neurosurgery no further intervention besides monitoring.  CT cervical spine showed chronic changes, x-ray of the hip and elbow were negative for acute pathology.  Chest x-ray showed bilateral pleural effusion.  Hemoglobin was down to 7.0 from baseline of around 10.0.  No obvious evidence of bleeding.  Hemoccult in the ER 1 sample was negative.  Hospital team requested to admit the patient.  During my visit patient was alert to name and place but confused about the date.  Slightly slow to respond but overall no focal neuro deficits.  Son was at bedside who confirmed this.  Social history Denies any tobacco alcohol or illicit drug use Patient is a full code.   Review of Systems: As per HPI otherwise 10 point review of systems negative.  Review of Systems Otherwise negative except as per HPI, including: General: Denies fever, chills, night sweats or unintended weight loss. Resp: Denies cough, wheezing, shortness of breath. Cardiac: Denies  chest pain, palpitations, orthopnea, paroxysmal nocturnal dyspnea. GI: Denies abdominal pain, nausea, vomiting, diarrhea or constipation GU: Denies dysuria, frequency, hesitancy or incontinence MS: Denies muscle aches, joint pain or swelling Neuro: Denies headache, neurologic deficits (focal weakness, numbness, tingling), abnormal gait Psych: Denies anxiety, depression, SI/HI/AVH Skin: Denies new rashes or lesions ID: Denies sick contacts, exotic exposures, travel  Past Medical History:  Diagnosis Date  . ANEMIA-IRON DEFICIENCY 03/09/2008  . Arthritis    cervical spine.   . Complex renal cyst 06/19/2011  . Depression 03/26/2014  . DIVERTICULOSIS, COLON 03/09/2008  . ESRD (end stage renal disease) ([place]) 03/09/2008  . Flu 12/08/2016  . GOUT 03/09/2008  . Hemorrhoids 08/2009   internal.   . History of blood transfusion   . HYPERLIPIDEMIA 03/09/2008  . Hyperparathyroidism ([place])   . HYPERTENSION 03/09/2008  . Prostate cancer [facility]) 2002   Completed external beam radiation 2003.per HPI  . PROSTATE CANCER, HX OF 03/09/2008  . Radiation cystitis 2010.  Marland Kitchen Stroke [facility])     Past Surgical History:  Procedure Laterality Date  . AV FISTULA PLACEMENT Left 01/24/2013   Procedure: INSERTION OF ARTERIOVENOUS (AV) GORE-TEX GRAFT ARM;  Surgeon: Rosetta Posner, MD;  Location: [place];  Service: Vascular;  Laterality: Left;  . COLONOSCOPY N/A 06/17/2015   Procedure: COLONOSCOPY;  Surgeon: Gatha Mayer, MD;  Location: [place];  Service: Endoscopy;  Laterality: N/A;  . ESOPHAGOGASTRODUODENOSCOPY N/A 06/17/2015   Procedure: ESOPHAGOGASTRODUODENOSCOPY (EGD);  Surgeon: Gatha Mayer, MD;  Location: [facility] ENDOSCOPY;  Service: Endoscopy;  Laterality: N/A;  . FLEXIBLE SIGMOIDOSCOPY N/A 06/15/2015  Procedure: FLEXIBLE SIGMOIDOSCOPY;  Surgeon: Gatha Mayer, MD;  Location[facility] ENDOSCOPY;  Service: Endoscopy;  Laterality: N/A;  . GIVENS CAPSULE STUDY N/A 07/06/2015   Procedure: GIVENS CAPSULE STUDY;  Surgeon: Milus Banister,  MD;  Location: [place];  Service: Endoscopy;  Laterality: N/A;  . JOINT REPLACEMENT     HIP  . rotator cuff repair right  4/08  . s/p left hip replacement  2007   Dr. Percell Miller ortho  . TONSILLECTOMY      SOCIAL HISTORY:  reports that he quit smoking about 37 years ago. His smoking use included cigarettes. He quit after 5.00 years of use. He has never used smokeless tobacco. He reports that he does not drink alcohol or use drugs.  No Known Allergies  FAMILY HISTORY: Family History  Problem Relation Age of Onset  . Hypertension Father   . Diabetes Father   . Cancer Father        prostate cancer  . Hypertension Brother   . Cancer Brother      Prior to Admission medications   Medication Sig Start Date End Date Taking? Authorizing Provider  allopurinol (ZYLOPRIM) 100 MG tablet TAKE 1 TABLET BY MOUTH EVERY DAY Patient taking differently: Take 100 mg by mouth daily.  03/03/20  Yes [name], MD  aspirin EC 81 MG tablet Take 1 tablet (81 mg total) by mouth daily. 12/12/16  Yes Rosalin Hawking, MD  AURYXIA 1 GM 210 MG(Fe) tablet Take 420 mg by mouth 3 (three) times daily with meals.  04/30/18  Yes [provider]  citalopram (CELEXA) 10 MG tablet Take 1 tablet (10 mg total) by mouth daily. 01/12/20  Yes [name], MD  latanoprost (XALATAN) 0.005 % ophthalmic solution Place 1 drop into both eyes at bedtime.  05/05/18  Yes [provider]  lidocaine-prilocaine (EMLA) cream Apply 1 application topically See admin instructions. Apply to access site 1 hour before dialysis. Cover with saran wrap 06/07/18  Yes [provider]  midodrine (PROAMATINE) 10 MG tablet Take 1 tablet (10 mg total) by mouth 3 (three) times daily. Take in the morning, lunch time and 4pm. Do not take after 4pm. 12/15/19  Yes [name], MD  Multiple Vitamin (MULTIVITAMIN WITH MINERALS) TABS tablet Take 1 tablet by mouth daily.   Yes [provider]  nystatin (MYCOSTATIN/NYSTOP) powder Use  as directed twice per day Patient taking differently: Apply 1 g topically 2 (two) times daily as needed (rash).  07/06/16  Yes [name], MD  rosuvastatin (CRESTOR) 20 MG tablet Take 1 tablet (20 mg total) by mouth at bedtime. 12/15/19  Yes [name], MD  Vitamin D, Ergocalciferol, (DRISDOL) 1.25 MG (50000 UNIT) CAPS capsule Take 1 capsule (50,000 Units total) by mouth every 7 (seven) days. 12/04/19  Yes [name], MD    Physical Exam: Vitals:   03/06/20 1315 03/06/20 1430 03/06/20 1500 03/06/20 1700  BP: (!) 128/59  (!) 149/77 (!) 155/80  Pulse:  87 73 85  Resp: 19 11 19 13   Temp:      TempSrc:      SpO2:  99% 93% 93%  Weight:      Height:          Constitutional: Elderly frail-appearing Eyes: PERRL, lids and conjunctivae normal ENMT: Mucous membranes are moist. Posterior pharynx clear of any exudate or lesions.Normal dentition.  Neck: normal, supple, no masses, no thyromegaly Respiratory: Bibasilar crackles Cardiovascular: Regular rate and rhythm, no  murmurs / rubs / gallops. No extremity edema. 2+ pedal pulses. No carotid bruits.  Abdomen: no tenderness, no masses palpated. No hepatosplenomegaly. Bowel sounds positive.  Musculoskeletal: no clubbing / cyanosis. No joint deformity upper and lower extremities. Good ROM, no contractures. Normal muscle tone.  Skin: no rashes, lesions, ulcers. No induration Neurologic: Overall his exam is nonfocal. Psychiatric: Poor judgment and insight  Right upper chest dialysis catheter noted without any evidence of bleeding or infection.   Labs on Admission: I have personally reviewed following labs and imaging studies  CBC: Recent Labs  Lab 03/06/20 1229  WBC 11.1*  HGB 7.0*  HCT 22.6*  MCV 100.9*  PLT 063   Basic Metabolic Panel: Recent Labs  Lab 03/06/20 1229  NA 137  K 3.7  CL 90*  CO2 30  GLUCOSE 115*  BUN 17  CREATININE 4.98*  CALCIUM 9.5   GFR: Estimated Creatinine Clearance: 9.9 mL/min (A) (by C-G formula  based on SCr of 4.98 mg/dL (H)). Liver Function Tests: Recent Labs  Lab 03/06/20 1437  AST 27  ALT 13  ALKPHOS 58  BILITOT 1.2  PROT 6.3*  ALBUMIN 3.4*   No results for input(s): LIPASE, AMYLASE in the last 168 hours. Recent Labs  Lab 03/06/20 1437  AMMONIA 18   Coagulation Profile: No results for input(s): INR, PROTIME in the last 168 hours. Cardiac Enzymes: No results for input(s): CKTOTAL, CKMB, CKMBINDEX, TROPONINI in the last 168 hours. BNP (last 3 results) No results for input(s): PROBNP in the last 8760 hours. HbA1C: No results for input(s): HGBA1C in the last 72 hours. CBG: No results for input(s): GLUCAP in the last 168 hours. Lipid Profile: No results for input(s): CHOL, HDL, LDLCALC, TRIG, CHOLHDL, LDLDIRECT in the last 72 hours. Thyroid Function Tests: Recent Labs    03/06/20 1437  TSH 1.228   Anemia Panel: No results for input(s): VITAMINB12, FOLATE, FERRITIN, TIBC, IRON, RETICCTPCT in the last 72 hours. Urine analysis:    Component Value Date/Time   COLORURINE YELLOW 01/30/2020 1518   APPEARANCEUR CLEAR 01/30/2020 1518   LABSPEC 1.020 01/30/2020 1518   PHURINE 8.5 (A) 01/30/2020 1518   GLUCOSEU NEGATIVE 01/30/2020 1518   HGBUR NEGATIVE 01/30/2020 1518   BILIRUBINUR NEGATIVE 01/30/2020 1518   KETONESUR NEGATIVE 01/30/2020 1518   PROTEINUR 100 (A) 10/04/2016 1344   UROBILINOGEN 0.2 01/30/2020 1518   NITRITE POSITIVE (A) 01/30/2020 1518   LEUKOCYTESUR MODERATE (A) 01/30/2020 1518   Sepsis Labs: !!!!!!!!!!!!!!!!!!!!!!!!!!!!!!!!!!!!!!!!!!!! @LABRCNTIP (procalcitonin:4,lacticidven:4) )No results found for this or any previous visit (from the past 240 hour(s)).   Radiological Exams on Admission: DG Chest 2 View  Result Date: 03/06/2020 CLINICAL DATA:  Falls over the past week.  Altered mental status. EXAM: CHEST - 2 VIEW COMPARISON:  12/08/2016 FINDINGS: Right IJ central venous catheter with tip over the SVC. Vascular stent projects over the left  subclavian region and also over the medial left upper arm. Lungs are adequately inflated with minimal layering bilateral pleural fluid. No definite focal airspace process. No pneumothorax. Borderline stable cardiomegaly. No acute fracture. IMPRESSION: 1.  Small bilateral layering pleural effusions. 2. Stable borderline cardiomegaly. Right IJ central venous catheter with tip over the SVC. Electronically Signed   By: Marin Olp M.D.   On: 03/06/2020 14:54   DG Elbow Complete Left  Result Date: 03/06/2020 CLINICAL DATA:  Several falls over the past week with left elbow pain. EXAM: LEFT ELBOW - COMPLETE 3+ VIEW COMPARISON:  None. FINDINGS: Minimal degenerative changes over the elbow.  Displaced anterior fat pad likely joint effusion. No definite fracture visualized. Partial visualization of a vascular stent over the medial upper arm. Surgical clips over the soft tissues of the elbow. IMPRESSION: Joint effusion.  No definite fracture visualized. Electronically Signed   By: Marin Olp M.D.   On: 03/06/2020 14:57   CT Head Wo Contrast  Result Date: 03/06/2020 CLINICAL DATA:  Altered mental status. Increasing phones. EXAM: CT HEAD WITHOUT CONTRAST TECHNIQUE: Contiguous axial images were obtained from the base of the skull through the vertex without intravenous contrast. COMPARISON:  MRI brain from 07/05/2015 FINDINGS: Brain: Small chronic lacunar infarct in the head of the right caudate nucleus. Periventricular white matter and corona radiata hypodensities favor chronic ischemic microvascular white matter disease. New right frontoparietal temporal subacute subdural hematoma with lower density component anteriorly and higher density component posteriorly. Anterior components up to about 8 mm in thickness. No significant degree of midline shift. No appreciable acute CVA or mass lesion. Vascular: There is atherosclerotic calcification of the cavernous carotid arteries bilaterally. Skull: Unremarkable Sinuses/Orbits:  As on the prior temporal bone CT of 08/09/2019, there is abnormal thickening of the osseous portion of the external auditory canal with some bony erosions. Other: No supplemental non-categorized findings. IMPRESSION: 1. Subacute right frontoparietotemporal subdural hematoma, 8 mm in thickness. No significant degree of midline shift. 2. Periventricular white matter and corona radiata hypodensities favor chronic ischemic microvascular white matter disease. 3. Small chronic lacunar infarct in the head of the right caudate nucleus. 4. Abnormal thickening of the osseous portion of the right external auditory canal with some bony erosions, query external auditory canal cholesteatoma. This is similar to that shown of the temporal bone CT of 08/19/2019. Electronically Signed   By: Van Clines M.D.   On: 03/06/2020 16:06   CT Cervical Spine Wo Contrast  Result Date: 03/06/2020 CLINICAL DATA:  Fall, altered mental status. EXAM: CT CERVICAL SPINE WITHOUT CONTRAST TECHNIQUE: Multidetector CT imaging of the cervical spine was performed without intravenous contrast. Multiplanar CT image reconstructions were also generated. COMPARISON:  CT cervical spine 08/17/2017 FINDINGS: Alignment: Continued abnormal predental space, with 5 mm of space between the anterior arch of C1 in the odontoid, previously 4.5 mm back on 08/17/2017 by my measurements. The appearance suggests some degree of atlantoaxial instability, but clearly seems to be a chronic finding. There is chronic grade 1 mild posterior subluxation at C3-4. Skull base and vertebrae: Acquired interbody fusions likely through fused spurring at C3-4, C4-5, C5-6, and potentially C7-T1. Fused facet joints on the left at C4-5 and C5-6. No acute fracture is identified. Severe spondylosis noted. Soft tissues and spinal canal: There is likely at least mild central narrowing of the thecal sac at the C1 level due to the atlantoaxial subluxation and a small amount of pannus  posterior to the odontoid. Right central line noted. Bilateral common carotid atherosclerotic calcification. Disc levels: Uncinate and facet spurring causes right foraminal impingement at C2-3, C3-4, C6-7, and potentially C7-T1; and left foraminal impingement at C3-4, C4-5, and C7-T1. Upper chest: Branch vessel atherosclerotic vascular calcification. Suspected right pleural effusion. Other: No supplemental non-categorized findings. IMPRESSION: 1. No acute cervical spine findings. 2. Continued abnormal predental space, with 5 mm of space between the anterior arch of C1 in the odontoid, previously 4.5 mm back on 08/17/2017 by my measurements. The appearance suggests chronic atlantoaxial instability/subluxation, but clearly seems to be a chronic finding. This is likely contributing to central narrowing of the thecal sac at the C1 level.  3. Severe spondylosis causing multilevel impingement. 4. Suspected right pleural effusion. 5. Atherosclerosis. Critical Value/emergent results were called by telephone at the time of interpretation on 03/06/2020 at 4:14 pm to provider Dr, Eugenio Hoes, who verbally acknowledged these results. In particular we discussed the cervical spine findings and also the intracranial findings including the presence of the subacute right subdural hematoma. Electronically Signed   By: Van Clines M.D.   On: 03/06/2020 16:14   DG Hips Bilat W or Wo Pelvis 5 Views  Result Date: 03/06/2020 CLINICAL DATA:  Recent falls with right hip pain. EXAM: DG HIP (WITH OR WITHOUT PELVIS) 5+V BILAT COMPARISON:  02/23/2015 FINDINGS: Left hip arthroplasty intact and unchanged. Mild degenerative changes of the right hip. Degenerative change of the spine and sacroiliac joints. No acute fracture or dislocation. IMPRESSION: No acute findings. Electronically Signed   By: Marin Olp M.D.   On: 03/06/2020 14:55     All images have been reviewed by me personally.  EKG: Independently reviewed.  Normal sinus  rhythm  Assessment/Plan Principal Problem:   Subdural hematoma (HCC) Active Problems:   Gout   Iron deficiency anemia   Essential hypertension   HLD (hyperlipidemia)   ESRD on dialysis (HCC)   Orthostatic hypotension   Falls   General weakness    Multiple falls with generalized weakness Right-sided frontal parietal subacute hematoma without midline shift or mass-effect -Admit to hospital for further care and management. -Continue neurochecks.  Seen by neurosurgery, no surgical indication.  Closely monitor. -PT/OT, vitamin D -Suspicion for underlying infection, cultures and UA ordered. -Supportive care. -Holding aspirin -CT head-subacute subdural hematoma without midline shift.  CT cervical spine-chronic changes.  Chest x-ray-some pleural effusion bilaterally, x-ray of hip and left elbow-negative for any acute pathology.  Acute on chronic iron deficiency anemia, also microcytic -Hemoccult negative.  Unclear etiology GI or other trauma given history of multiple falls. -Check iron studies, B12, folate, TSH -Transfuse 1 unit PRBC -CT chest abdomen pelvis without contrast to look for any obvious signs of bleeding given multiple falls  Acute metabolic encephalopathy -Exam is mostly nonfocal.  Currently alert awake oriented X 2.  Suspect underlying infection.  Ongoing work-up.  ESRD hemodialysis Monday Wednesday Friday -We will consult nephrology tomorrow.  No urgent indication for dialysis at this time.  Orthostatic hypotension -On midodrine  Hyperlipidemia -Statin  History of dementia with occasional behavioral disturbance -On Celexa.  Supportive care.  Gout -On allopurinol, currently on hold   DVT prophylaxis: SCDs Code Status: Full code Family Communication: Son at bedside Consults called: Neurosurgery Admission status: Inpatient admission  Status is: Inpatient  Remains inpatient appropriate because:Hemodynamically unstable, altered mental status   Dispo:  The patient is from: Home              Anticipated d/c is to: Home              Anticipated d/c date is: 2 days              Patient currently is not medically stable to d/c.        Time Spent: 65 minutes.  >50% of the time was devoted to discussing the patients care, assessment, plan and disposition with other care givers along with counseling the patient about the risks and benefits of treatment.    Special Ranes Arsenio Loader MD [facility]  If 7PM-7AM, please contact night-coverage   03/06/2020, 5:24 PM

## 2020-03-06 NOTE — Consult Note (Signed)
Neurosurgery Consultation  Reason for Consult: Subdural hematoma Referring Physician: Reesa Chew  CC: Falls  HPI: This is a 86 y.o. man that presents with frequent falls in the setting of multiple medical problems including orthostatic hypotension and ESRD. His son was present and helped with obtaining history. He has bene diffusely weak for roughly 3-4 weeks after diagnosis of a UTI. He has had a number of falls with overall global decline in function, so his son brought him to the hospital. He denies any new focal weakness, numbness, or parasthesias, no recent change in bowel or bladder function. No headaches, nausea, or vomiting.   ROS: A 14 point ROS was performed and is negative except as noted in the HPI.   PMHx:  Past Medical History:  Diagnosis Date  . ANEMIA-IRON DEFICIENCY 03/09/2008  . Arthritis    cervical spine.   . Complex renal cyst 06/19/2011  . Depression 03/26/2014  . DIVERTICULOSIS, COLON 03/09/2008  . ESRD (end stage renal disease) ([place]) 03/09/2008  . Flu 12/08/2016  . GOUT 03/09/2008  . Hemorrhoids 08/2009   internal.   . History of blood transfusion   . HYPERLIPIDEMIA 03/09/2008  . Hyperparathyroidism ([place])   . HYPERTENSION 03/09/2008  . Prostate cancer [facility]) 2002   Completed external beam radiation 2003.per HPI  . PROSTATE CANCER, HX OF 03/09/2008  . Radiation cystitis 2010.  Marland Kitchen Stroke [facility])    FamHx:  Family History  Problem Relation Age of Onset  . Hypertension Father   . Diabetes Father   . Cancer Father        prostate cancer  . Hypertension Brother   . Cancer Brother    SocHx:  reports that he quit smoking about 37 years ago. His smoking use included cigarettes. He quit after 5.00 years of use. He has never used smokeless tobacco. He reports that he does not drink alcohol or use drugs.  Exam: Vital signs in last 24 hours: Temp:  [98.1 F (36.7 C)-98.9 F (37.2 C)] 98.1 F (36.7 C) (05/01 1220) Pulse Rate:  [48-89] 86 (05/01 2030) Resp:  [11-22] 16 (05/01  2030) BP: (128-155)/(58-80) 149/60 (05/01 2030) SpO2:  [90 %-100 %] 98 % (05/01 2030) Weight:  [68 kg] 68 kg (05/01 1159) General: Awake, alert, cooperative, lying in bed in NAD Head: Normocephalic and atruamatic HEENT: Neck supple Pulmonary: breathing room air comfortably, no evidence of increased work of breathing Cardiac: RRR Abdomen: S NT ND Extremities: Warm and well perfused x4, fistula present in RUE Neuro: AOx3, PERRL with cataracts OU, EOMI, FS Strength 5/5 x4, SILTx4, no hoffman's   Assessment and Plan: 86 y.o. man w/ multiple falls, PMHx includes ESRD. [place] personally reviewed, which shows right subacute subdural hematoma without midline shift, some possible layering hyperdense blood products posteriorly or on the tentorium.   -No neurosurgical intervention indicated, no need for scheduled repeat imaging, findings likely due to a recent fall and do not appear to be the cause of his falls.  Judith Part, MD 03/06/20 8:39 PM [facility]

## 2020-03-06 NOTE — ED Notes (Signed)
Pt transported to XR.  

## 2020-03-06 NOTE — ED Triage Notes (Addendum)
Family reports increased falls over the last week.  Reports swelling to L arm and bruise to R hip.  No arm drift.  Reports generalized weakness.

## 2020-03-06 NOTE — ED Provider Notes (Signed)
[place] EMERGENCY DEPARTMENT Provider Note   CSN: [id number] Arrival date & time: 03/06/20  1146     History Chief Complaint  Patient presents with  . Fall    Sahith Nurse Mastropietro is a 86 y.o. male.  The history is provided by the patient, a relative and medical records. The history is limited by the condition of the patient.  Fall This is a new problem. The current episode started more than 2 days ago. The problem occurs every several days. The problem has not changed since onset.Pertinent negatives include no chest pain, no abdominal pain, no headaches and no shortness of breath. Nothing aggravates the symptoms. Nothing relieves the symptoms. He has tried nothing for the symptoms. The treatment provided no relief.       Past Medical History:  Diagnosis Date  . ANEMIA-IRON DEFICIENCY 03/09/2008  . Arthritis    cervical spine.   . Complex renal cyst 06/19/2011  . Depression 03/26/2014  . DIVERTICULOSIS, COLON 03/09/2008  . ESRD (end stage renal disease) ([place]) 03/09/2008  . Flu 12/08/2016  . GOUT 03/09/2008  . Hemorrhoids 08/2009   internal.   . History of blood transfusion   . HYPERLIPIDEMIA 03/09/2008  . Hyperparathyroidism ([place])   . HYPERTENSION 03/09/2008  . Prostate cancer [facility]) 2002   Completed external beam radiation 2003.per HPI  . PROSTATE CANCER, HX OF 03/09/2008  . Radiation cystitis 2010.  Marland Kitchen Stroke [facility])     Patient Active Problem List   Diagnosis Date Noted  . Vitamin D deficiency 01/10/2020  . Benign neoplasm of scrotum 01/08/2020  . Bilateral hearing loss due to cerumen impaction 03/17/2019  . Irregular heart rhythm 10/10/2018  . Left flank pain 01/10/2018  . Influenza A 12/08/2016  . Elevated troponin 12/08/2016  . Anemia in chronic kidney disease (CKD) 10/04/2016  . Orthostatic hypotension 09/07/2016  . Mass of skin of finger of left hand 10/11/2015  . ESRD on dialysis ([place]) 09/09/2015  . Cerebrovascular accident (CVA) due to thrombosis of  right middle cerebral artery ([place]) 09/09/2015  . History of GI bleed   . HLD (hyperlipidemia)   . Benign neoplasm of descending colon   . Gastritis   . Syncope 06/15/2015  . Diverticulosis of colon with hemorrhage   . Low back pain 03/26/2014  . Depression 03/26/2014  . Complex renal cyst 06/19/2011  . Preventative health care 06/17/2011  . SHOULDER PAIN, LEFT 06/03/2010  . Gout 03/09/2008  . Iron deficiency anemia 03/09/2008  . Essential hypertension 03/09/2008  . FATIGUE 03/09/2008  . PROSTATE CANCER, HX OF 03/09/2008    Past Surgical History:  Procedure Laterality Date  . AV FISTULA PLACEMENT Left 01/24/2013   Procedure: INSERTION OF ARTERIOVENOUS (AV) GORE-TEX GRAFT ARM;  Surgeon: Rosetta Posner, MD;  Location: [place];  Service: Vascular;  Laterality: Left;  . COLONOSCOPY N/A 06/17/2015   Procedure: COLONOSCOPY;  Surgeon: Gatha Mayer, MD;  Location: [place];  Service: Endoscopy;  Laterality: N/A;  . ESOPHAGOGASTRODUODENOSCOPY N/A 06/17/2015   Procedure: ESOPHAGOGASTRODUODENOSCOPY (EGD);  Surgeon: Gatha Mayer, MD;  Location: [facility] ENDOSCOPY;  Service: Endoscopy;  Laterality: N/A;  . FLEXIBLE SIGMOIDOSCOPY N/A 06/15/2015   Procedure: FLEXIBLE SIGMOIDOSCOPY;  Surgeon: Gatha Mayer, MD;  Location: [place];  Service: Endoscopy;  Laterality: N/A;  . GIVENS CAPSULE STUDY N/A 07/06/2015   Procedure: GIVENS CAPSULE STUDY;  Surgeon: Milus Banister, MD;  Location: [place];  Service: Endoscopy;  Laterality: N/A;  . JOINT REPLACEMENT  HIP  . rotator cuff repair right  4/08  . s/p left hip replacement  2007   Dr. Percell Miller ortho  . TONSILLECTOMY         Family History  Problem Relation Age of Onset  . Hypertension Father   . Diabetes Father   . Cancer Father        prostate cancer  . Hypertension Brother   . Cancer Brother     Social History   Tobacco Use  . Smoking status: Former Smoker    Years: 5.00    Types: Cigarettes    Quit date: 09/17/1982    Years  since quitting: 37.4  . Smokeless tobacco: Never Used  Substance Use Topics  . Alcohol use: No    Alcohol/week: 0.0 standard drinks    Comment: rare  . Drug use: No    Home Medications Prior to Admission medications   Medication Sig Start Date End Date Taking? Authorizing Provider  allopurinol (ZYLOPRIM) 100 MG tablet TAKE 1 TABLET BY MOUTH EVERY DAY 03/03/20   [name], MD  aspirin EC 81 MG tablet Take 1 tablet (81 mg total) by mouth daily. 12/12/16   Rosalin Hawking, MD  AURYXIA 1 GM 210 MG(Fe) tablet TAKE 2 TABLETS BY MOUTH THREE TIMES A DAY WITH MEALS.   SWALLOW WHOLE, DO NOT CHEW OR CRUSH MEDICATION 04/30/18   [provider]  citalopram (CELEXA) 10 MG tablet Take 1 tablet (10 mg total) by mouth daily. 01/12/20   [name], MD  latanoprost (XALATAN) 0.005 % ophthalmic solution INSTILL 1 DROP BY OPHTHALMIC ROUTE EVERY DAY INTO BOTH EYE(S) IN THE EVENING 05/05/18   [provider]  lidocaine-prilocaine (EMLA) cream APPLY SMALL AMOUNT TO ACCESS SITE (AVF) 1 HOUR BEFORE DIALYSIS. COVER WITH OCCLUSIVE DRESSING (SARAN WRAP) 06/07/18   [provider]  midodrine (PROAMATINE) 10 MG tablet Take 1 tablet (10 mg total) by mouth 3 (three) times daily. Take in the morning, lunch time and 4pm. Do not take after 4pm. 12/15/19   [name], MD  Multiple Vitamin (MULTIVITAMIN WITH MINERALS) TABS tablet Take 1 tablet by mouth daily.    [provider]  nystatin (MYCOSTATIN/NYSTOP) powder Use as directed twice per day Patient taking differently: Apply 1 g topically 2 (two) times daily as needed (rash).  07/06/16   [name], MD  rosuvastatin (CRESTOR) 20 MG tablet Take 1 tablet (20 mg total) by mouth at bedtime. 12/15/19   [name], MD  Vitamin D, Ergocalciferol, (DRISDOL) 1.25 MG (50000 UNIT) CAPS capsule Take 1 capsule (50,000 Units total) by mouth every 7 (seven) days. 12/04/19   [name], MD    Allergies    Patient has no known allergies.  Review of  Systems   Review of Systems  Constitutional: Positive for appetite change and fatigue.  HENT: Negative for congestion.   Eyes: Negative for visual disturbance.  Respiratory: Negative for cough, choking, chest tightness, shortness of breath and wheezing.   Cardiovascular: Negative for chest pain, palpitations and leg swelling.  Gastrointestinal: Negative for abdominal pain, constipation, diarrhea, nausea and vomiting.  Genitourinary: Positive for dysuria. Negative for flank pain and frequency.  Musculoskeletal: Negative for back pain, neck pain and neck stiffness.  Skin: Positive for color change (bruising). Negative for rash and wound.  Neurological: Positive for light-headedness. Negative for dizziness, facial asymmetry, weakness, numbness and headaches.  Psychiatric/Behavioral: Positive for confusion.  All other systems reviewed and are negative.   Physical  Exam Updated Vital Signs BP (!) 142/66 (BP Location: Right Arm)   Pulse (!) 48   Temp 98.9 F (37.2 C) (Oral)   Resp 18   Ht 5\' 9"  (1.753 m)   Wt 68 kg   SpO2 90%   BMI 22.15 kg/m   Physical Exam Vitals and nursing note reviewed.  Constitutional:      General: He is not in acute distress.    Appearance: He is not ill-appearing, toxic-appearing or diaphoretic.  HENT:     Head: Normocephalic.     Nose: Nose normal. No congestion or rhinorrhea.     Mouth/Throat:     Mouth: Mucous membranes are moist.     Pharynx: No oropharyngeal exudate or posterior oropharyngeal erythema.  Eyes:     Extraocular Movements: Extraocular movements intact.     Conjunctiva/sclera: Conjunctivae normal.     Pupils: Pupils are equal, round, and reactive to light.  Cardiovascular:     Rate and Rhythm: Normal rate.     Pulses: Normal pulses.     Heart sounds: Murmur present.  Pulmonary:     Effort: Pulmonary effort is normal. No respiratory distress.     Breath sounds: No wheezing, rhonchi or rales.  Chest:     Chest wall: No tenderness.    Abdominal:     General: Abdomen is flat. There is no distension.     Tenderness: There is no abdominal tenderness. There is no right CVA tenderness, left CVA tenderness, guarding or rebound.  Musculoskeletal:        General: Swelling and tenderness present.     Left elbow: Swelling present. Tenderness present.     Right lower leg: Edema present.     Left lower leg: Edema present.       Legs:     Comments: Bruising and tenderness and swelling of L elbow area. Good grip strength, sensation , and pulses.    tendernes in both hips. bruoising on R hip. Good sensation, strength and pulses in legs.   Skin:    Capillary Refill: Capillary refill takes less than 2 seconds.     Findings: Bruising present. No erythema.  Neurological:     General: No focal deficit present.     Mental Status: He is alert. He is disoriented.     GCS: GCS eye subscore is 4. GCS verbal subscore is 5. GCS motor subscore is 6.     Cranial Nerves: No dysarthria or facial asymmetry.     Sensory: No sensory deficit.     Motor: No weakness or tremor.     Comments: Gait initially deferred 2/2 falls recurring.   Psychiatric:        Mood and Affect: Mood normal.     ED Results / Procedures / Treatments   Labs (all labs ordered are listed, but only abnormal results are displayed) Labs Reviewed  BASIC METABOLIC PANEL - Abnormal; Notable for the following components:      Result Value   Chloride 90 (*)    Glucose, Bld 115 (*)    Creatinine, Ser 4.98 (*)    GFR calc non Af Amer 10 (*)    GFR calc Af Amer 11 (*)    Anion gap 17 (*)    All other components within normal limits  CBC - Abnormal; Notable for the following components:   WBC 11.1 (*)    RBC 2.24 (*)    Hemoglobin 7.0 (*)    HCT 22.6 (*)  MCV 100.9 (*)    RDW 16.6 (*)    All other components within normal limits  HEPATIC FUNCTION PANEL - Abnormal; Notable for the following components:   Total Protein 6.3 (*)    Albumin 3.4 (*)    Bilirubin,  Direct 0.3 (*)    All other components within normal limits  FERRITIN - Abnormal; Notable for the following components:   Ferritin 1,884 (*)    All other components within normal limits  URINE CULTURE  CULTURE, BLOOD (ROUTINE X 2)  CULTURE, BLOOD (ROUTINE X 2)  TSH  AMMONIA  VITAMIN B12  CK  HEMOGLOBIN A1C  URINALYSIS, ROUTINE W REFLEX MICROSCOPIC  FOLATE RBC  CBC  MAGNESIUM  COMPREHENSIVE METABOLIC PANEL  URINALYSIS, ROUTINE W REFLEX MICROSCOPIC  POC OCCULT BLOOD, ED  TYPE AND SCREEN  PREPARE RBC (CROSSMATCH)    EKG EKG Interpretation  Date/Time:  Saturday Mar 06 2020 12:09:10 EDT Ventricular Rate:  90 PR Interval:    QRS Duration: 122 QT Interval:  428 QTC Calculation: 524 R Axis:   -46 Text Interpretation: Sinus rhythm Multiform ventricular premature complexes Left bundle branch block When compared to prior, more PVC and longer QTc. No STEMI Confirmed by Antony Blackbird [phone]) on 03/06/2020 12:17:36 PM   Radiology CT ABDOMEN PELVIS WO CONTRAST  Result Date: 03/06/2020 CLINICAL DATA:  Increasing falls. Swelling to the left arm and bruising over the right hip. Generalized weakness. EXAM: CT CHEST, ABDOMEN AND PELVIS WITHOUT CONTRAST TECHNIQUE: Multidetector CT imaging of the chest, abdomen and pelvis was performed following the standard protocol without IV contrast. COMPARISON:  November 10, 2019 FINDINGS: CT CHEST FINDINGS Cardiovascular: The heart is enlarged. There is no significant pericardial effusion. The intracardiac blood pool is hypodense relative to the adjacent myocardium consistent with anemia. There are atherosclerotic changes of the thoracic aorta without evidence for an aneurysm. There is a well-positioned tunneled dialysis catheter with tip terminating in the high right atrium. Mediastinum/Nodes: --No mediastinal or hilar lymphadenopathy. --No axillary lymphadenopathy. --No supraclavicular lymphadenopathy. --Normal thyroid gland. --The esophagus is unremarkable  Lungs/Pleura: There are moderate-sized bilateral pleural effusions, right greater than left. There is interlobular septal thickening bilaterally. There is a ground-glass airspace opacity in the right upper lobe (axial series 5, image 59). There is no pneumothorax. There is some bronchial wall thickening and mucus plugging at the lung bases bilaterally. The trachea is unremarkable. Musculoskeletal: There are healing left-sided rib fractures. CT ABDOMEN PELVIS FINDINGS Hepatobiliary: There is a large cyst in the right hepatic lobe which is stable from prior study. Normal gallbladder.There is no biliary ductal dilation. Pancreas: Normal contours without ductal dilatation. No peripancreatic fluid collection. Spleen: Unremarkable. Adrenals/Urinary Tract: --Adrenal glands: There is a stable benign lipid rich left adrenal adenoma. --Right kidney/ureter: The right kidney is atrophic. --Left kidney/ureter: The left kidney is atrophic. --Urinary bladder: Unremarkable. Stomach/Bowel: --Stomach/Duodenum: No hiatal hernia or other gastric abnormality. Normal duodenal course and caliber. --Small bowel: Unremarkable. --Colon: There is scattered colonic diverticula without CT evidence for diverticulitis. --Appendix: Normal. Vascular/Lymphatic: Atherosclerotic calcification is present within the non-aneurysmal abdominal aorta, without hemodynamically significant stenosis. --No retroperitoneal lymphadenopathy. --No mesenteric lymphadenopathy. --No pelvic or inguinal lymphadenopathy. Reproductive: Unremarkable Other: There is a small amount of free fluid in the patient's abdomen and pelvis. There is no free air. Musculoskeletal. There is a hematoma overlying the proximal lateral left hip measuring approximately 5.8 x 3.7 cm. The patient is status post total hip arthroplasty on the left. The hardware appears intact where visualized. There is  a chronic appearing fracture of the superior pubic ramus on the left. IMPRESSION: 1.  Moderate-sized bilateral pleural effusions, right greater than left, with interlobular septal thickening, suggestive of interstitial pulmonary edema. 2. Ground-glass airspace opacity in the right upper lobe, concerning for pneumonia. 3. Status post left total hip arthroplasty. There is a 5.8 x 3.7 cm hematoma overlying the proximal lateral left hip. 4. Atrophic kidneys. 5. Colonic diverticulosis without CT evidence for diverticulitis. Aortic Atherosclerosis (ICD10-I70.0). Electronically Signed   By: Constance Hol[place] M.D.   On: 03/06/2020 19:20   DG Chest 2 View  Result Date: 03/06/2020 CLINICAL DATA:  Falls over the past week.  Altered mental status. EXAM: CHEST - 2 VIEW COMPARISON:  12/08/2016 FINDINGS: Right IJ central venous catheter with tip over the SVC. Vascular stent projects over the left subclavian region and also over the medial left upper arm. Lungs are adequately inflated with minimal layering bilateral pleural fluid. No definite focal airspace process. No pneumothorax. Borderline stable cardiomegaly. No acute fracture. IMPRESSION: 1.  Small bilateral layering pleural effusions. 2. Stable borderline cardiomegaly. Right IJ central venous catheter with tip over the SVC. Electronically Signed   By: Marin Olp M.D.   On: 03/06/2020 14:54   DG Elbow Complete Left  Result Date: 03/06/2020 CLINICAL DATA:  Several falls over the past week with left elbow pain. EXAM: LEFT ELBOW - COMPLETE 3+ VIEW COMPARISON:  None. FINDINGS: Minimal degenerative changes over the elbow. Displaced anterior fat pad likely joint effusion. No definite fracture visualized. Partial visualization of a vascular stent over the medial upper arm. Surgical clips over the soft tissues of the elbow. IMPRESSION: Joint effusion.  No definite fracture visualized. Electronically Signed   By: Marin Olp M.D.   On: 03/06/2020 14:57   CT Head Wo Contrast  Result Date: 03/06/2020 CLINICAL DATA:  Altered mental status. Increasing  phones. EXAM: CT HEAD WITHOUT CONTRAST TECHNIQUE: Contiguous axial images were obtained from the base of the skull through the vertex without intravenous contrast. COMPARISON:  MRI brain from 07/05/2015 FINDINGS: Brain: Small chronic lacunar infarct in the head of the right caudate nucleus. Periventricular white matter and corona radiata hypodensities favor chronic ischemic microvascular white matter disease. New right frontoparietal temporal subacute subdural hematoma with lower density component anteriorly and higher density component posteriorly. Anterior components up to about 8 mm in thickness. No significant degree of midline shift. No appreciable acute CVA or mass lesion. Vascular: There is atherosclerotic calcification of the cavernous carotid arteries bilaterally. Skull: Unremarkable Sinuses/Orbits: As on the prior temporal bone CT of 08/09/2019, there is abnormal thickening of the osseous portion of the external auditory canal with some bony erosions. Other: No supplemental non-categorized findings. IMPRESSION: 1. Subacute right frontoparietotemporal subdural hematoma, 8 mm in thickness. No significant degree of midline shift. 2. Periventricular white matter and corona radiata hypodensities favor chronic ischemic microvascular white matter disease. 3. Small chronic lacunar infarct in the head of the right caudate nucleus. 4. Abnormal thickening of the osseous portion of the right external auditory canal with some bony erosions, query external auditory canal cholesteatoma. This is similar to that shown of the temporal bone CT of 08/19/2019. Electronically Signed   By: Van Clines M.D.   On: 03/06/2020 16:06   CT CHEST WO CONTRAST  Result Date: 03/06/2020 CLINICAL DATA:  Increasing falls. Swelling to the left arm and bruising over the right hip. Generalized weakness. EXAM: CT CHEST, ABDOMEN AND PELVIS WITHOUT CONTRAST TECHNIQUE: Multidetector CT imaging of the chest, abdomen  and pelvis was  performed following the standard protocol without IV contrast. COMPARISON:  November 10, 2019 FINDINGS: CT CHEST FINDINGS Cardiovascular: The heart is enlarged. There is no significant pericardial effusion. The intracardiac blood pool is hypodense relative to the adjacent myocardium consistent with anemia. There are atherosclerotic changes of the thoracic aorta without evidence for an aneurysm. There is a well-positioned tunneled dialysis catheter with tip terminating in the high right atrium. Mediastinum/Nodes: --No mediastinal or hilar lymphadenopathy. --No axillary lymphadenopathy. --No supraclavicular lymphadenopathy. --Normal thyroid gland. --The esophagus is unremarkable Lungs/Pleura: There are moderate-sized bilateral pleural effusions, right greater than left. There is interlobular septal thickening bilaterally. There is a ground-glass airspace opacity in the right upper lobe (axial series 5, image 59). There is no pneumothorax. There is some bronchial wall thickening and mucus plugging at the lung bases bilaterally. The trachea is unremarkable. Musculoskeletal: There are healing left-sided rib fractures. CT ABDOMEN PELVIS FINDINGS Hepatobiliary: There is a large cyst in the right hepatic lobe which is stable from prior study. Normal gallbladder.There is no biliary ductal dilation. Pancreas: Normal contours without ductal dilatation. No peripancreatic fluid collection. Spleen: Unremarkable. Adrenals/Urinary Tract: --Adrenal glands: There is a stable benign lipid rich left adrenal adenoma. --Right kidney/ureter: The right kidney is atrophic. --Left kidney/ureter: The left kidney is atrophic. --Urinary bladder: Unremarkable. Stomach/Bowel: --Stomach/Duodenum: No hiatal hernia or other gastric abnormality. Normal duodenal course and caliber. --Small bowel: Unremarkable. --Colon: There is scattered colonic diverticula without CT evidence for diverticulitis. --Appendix: Normal. Vascular/Lymphatic: Atherosclerotic  calcification is present within the non-aneurysmal abdominal aorta, without hemodynamically significant stenosis. --No retroperitoneal lymphadenopathy. --No mesenteric lymphadenopathy. --No pelvic or inguinal lymphadenopathy. Reproductive: Unremarkable Other: There is a small amount of free fluid in the patient's abdomen and pelvis. There is no free air. Musculoskeletal. There is a hematoma overlying the proximal lateral left hip measuring approximately 5.8 x 3.7 cm. The patient is status post total hip arthroplasty on the left. The hardware appears intact where visualized. There is a chronic appearing fracture of the superior pubic ramus on the left. IMPRESSION: 1. Moderate-sized bilateral pleural effusions, right greater than left, with interlobular septal thickening, suggestive of interstitial pulmonary edema. 2. Ground-glass airspace opacity in the right upper lobe, concerning for pneumonia. 3. Status post left total hip arthroplasty. There is a 5.8 x 3.7 cm hematoma overlying the proximal lateral left hip. 4. Atrophic kidneys. 5. Colonic diverticulosis without CT evidence for diverticulitis. Aortic Atherosclerosis (ICD10-I70.0). Electronically Signed   By: Constance [place] M.D.   On: 03/06/2020 19:20   CT Cervical Spine Wo Contrast  Result Date: 03/06/2020 CLINICAL DATA:  Fall, altered mental status. EXAM: CT CERVICAL SPINE WITHOUT CONTRAST TECHNIQUE: Multidetector CT imaging of the cervical spine was performed without intravenous contrast. Multiplanar CT image reconstructions were also generated. COMPARISON:  CT cervical spine 08/17/2017 FINDINGS: Alignment: Continued abnormal predental space, with 5 mm of space between the anterior arch of C1 in the odontoid, previously 4.5 mm back on 08/17/2017 by my measurements. The appearance suggests some degree of atlantoaxial instability, but clearly seems to be a chronic finding. There is chronic grade 1 mild posterior subluxation at C3-4. Skull base and  vertebrae: Acquired interbody fusions likely through fused spurring at C3-4, C4-5, C5-6, and potentially C7-T1. Fused facet joints on the left at C4-5 and C5-6. No acute fracture is identified. Severe spondylosis noted. Soft tissues and spinal canal: There is likely at least mild central narrowing of the thecal sac at the C1 level due to the atlantoaxial subluxation  and a small amount of pannus posterior to the odontoid. Right central line noted. Bilateral common carotid atherosclerotic calcification. Disc levels: Uncinate and facet spurring causes right foraminal impingement at C2-3, C3-4, C6-7, and potentially C7-T1; and left foraminal impingement at C3-4, C4-5, and C7-T1. Upper chest: Branch vessel atherosclerotic vascular calcification. Suspected right pleural effusion. Other: No supplemental non-categorized findings. IMPRESSION: 1. No acute cervical spine findings. 2. Continued abnormal predental space, with 5 mm of space between the anterior arch of C1 in the odontoid, previously 4.5 mm back on 08/17/2017 by my measurements. The appearance suggests chronic atlantoaxial instability/subluxation, but clearly seems to be a chronic finding. This is likely contributing to central narrowing of the thecal sac at the C1 level. 3. Severe spondylosis causing multilevel impingement. 4. Suspected right pleural effusion. 5. Atherosclerosis. Critical Value/emergent results were called by telephone at the time of interpretation on 03/06/2020 at 4:14 pm to provider Dr, Eugenio Hoes, who verbally acknowledged these results. In particular we discussed the cervical spine findings and also the intracranial findings including the presence of the subacute right subdural hematoma. Electronically Signed   By: Van Clines M.D.   On: 03/06/2020 16:14   DG Hips Bilat W or Wo Pelvis 5 Views  Result Date: 03/06/2020 CLINICAL DATA:  Recent falls with right hip pain. EXAM: DG HIP (WITH OR WITHOUT PELVIS) 5+V BILAT COMPARISON:  02/23/2015  FINDINGS: Left hip arthroplasty intact and unchanged. Mild degenerative changes of the right hip. Degenerative change of the spine and sacroiliac joints. No acute fracture or dislocation. IMPRESSION: No acute findings. Electronically Signed   By: Marin Olp M.D.   On: 03/06/2020 14:55    Procedures Procedures (including critical care time)  CRITICAL CARE Performed by: Gwenyth Allegra Larell Baney Total critical care time: 35 minutes Critical care time was exclusive of separately billable procedures and treating other patients. Critical care was necessary to treat or prevent imminent or life-threatening deterioration. Critical care was time spent personally by me on the following activities: development of treatment plan with patient and/or surrogate as well as nursing, discussions with consultants, evaluation of patient's response to treatment, examination of patient, obtaining history from patient or surrogate, ordering and performing treatments and interventions, ordering and review of laboratory studies, ordering and review of radiographic studies, pulse oximetry and re-evaluation of patient's condition.   Medications Ordered in ED Medications  0.9 %  sodium chloride infusion (Manually program via Guardrails IV Fluids) (has no administration in time range)  polyethylene glycol (MIRALAX / GLYCOLAX) packet 17 g (has no administration in time range)  senna-docusate (Senokot-S) tablet 2 tablet (has no administration in time range)  midodrine (PROAMATINE) tablet 10 mg (has no administration in time range)  rosuvastatin (CRESTOR) tablet 20 mg (has no administration in time range)  citalopram (CELEXA) tablet 10 mg (10 mg Oral Given 03/06/20 1742)  ferric citrate (AURYXIA) tablet 420 mg (has no administration in time range)  multivitamin with minerals tablet 1 tablet (1 tablet Oral Given 03/06/20 1742)  HYDROcodone-acetaminophen (NORCO/VICODIN) 5-325 MG per tablet 1-2 tablet (has no administration in time  range)  ondansetron (ZOFRAN) tablet 4 mg (has no administration in time range)    Or  ondansetron (ZOFRAN) injection 4 mg (has no administration in time range)  insulin aspart (novoLOG) injection 0-5 Units (has no administration in time range)  insulin aspart (novoLOG) injection 2 Units (has no administration in time range)  insulin aspart (novoLOG) injection 0-6 Units (has no administration in time range)  sodium chloride flush (  NS) 0.9 % injection 3 mL (3 mLs Intravenous Given 03/06/20 1440)  pantoprazole (PROTONIX) 80 mg in sodium chloride 0.9 % 100 mL IVPB (0 mg Intravenous Stopped 03/06/20 1857)    ED Course  I have reviewed the triage vital signs and the nursing notes.  Pertinent labs & imaging results that were available during my care of the patient were reviewed by me and considered in my medical decision making (see chart for details).    MDM Rules/Calculators/A&P                      Traeton Bordas Newcombe is a 86 y.o. male with a past medical history significant for hypertension, prior GI bleed with diverticulitis, ESRD on dialysis MWF, hyperlipidemia, prior stroke, prior prostate cancer and recent urinary tract infection 3 weeks ago status post antibiotic course who presents with multiple falls, left elbow pain, left hip pain, generalized fatigue, weakness, and altered mental status. According to patient, he is just feeling tired and has had multiple falls. The son is with him and reports that the patient had a UTI 3 weeks ago and completed course of antibiotics but has still not quite gotten back to his normal self. He reports that he has had approximately 3 falls this week and has significant bruising and pounding to his left arm. Is also had pain in his left hip where he has had previous hip surgery. He has been able to walk some with a walker but even that is getting worse. Family reports has not been eating or drinking as much and is getting dehydrated. They report he is still confused and  not acting like his normal self. He is more disoriented. They report he does not have much cough chest pain or shortness of breath and he denies palpitations. He denies any nausea, vomiting, abdominal pain.  On exam, lungs are clear and chest is nontender. Abdomen is nontender. Patient does have pain and tenderness to his left elbow and significant bruising along the elbow. Also some pain in his left hip but has good sensation and strength and pulses in lower extremities. Palpable radial pulses in both arms. Symmetric grip strength and sensation in the arms. No evidence of head trauma or neck trauma seen on my exam.  Given the patient's falls, we will Do imaging of his head and neck. We will also get x-rays of the chest, elbow, and hip/pelvis. We will get screening labs to look for what is causing him to have the altered mental status and recurrent falls. Will get urinalysis to look for persistent UTI which may be contributing to the altered mental status and generalized weakness/fatigue.  Work-up began to return and CBC shows new anemia of 7.0. We will get a fecal occult to look for GI bleed although patient denies any dark stools or rectal bleeding. Patient does have a history of GI bleed in the chart.  Anticipate admission for symptomatic anemia leading to altered mental status and falls once work-up is completed.  Care transferred to Dr. Wilson Singer while waiting for diagnostic testing results to return.  Anticipate admission for likely symptomatic anemia leading to the falls, altered mental status, and fatigue.   Final Clinical Impression(s) / ED Diagnoses Final diagnoses:  Multiple falls  Fatigue, unspecified type   Clinical Impression: 1. Multiple falls   2. Fatigue, unspecified type     Disposition: Admit  This note was prepared with assistance of Dragon voice recognition software. Occasional wrong-word or sound-a-like  substitutions may have occurred due to the inherent limitations of  voice recognition software.      Kynlie Jane, Gwenyth Allegra, MD 03/06/20 2026

## 2020-03-07 LAB — CBC
HCT: 24.5 % — ABNORMAL LOW (ref 39.0–52.0)
Hemoglobin: 7.8 g/dL — ABNORMAL LOW (ref 13.0–17.0)
MCH: 30.5 pg (ref 26.0–34.0)
MCHC: 31.8 g/dL (ref 30.0–36.0)
MCV: 95.7 fL (ref 80.0–100.0)
Platelets: 206 10*3/uL (ref 150–400)
RBC: 2.56 MIL/uL — ABNORMAL LOW (ref 4.22–5.81)
RDW: 16.7 % — ABNORMAL HIGH (ref 11.5–15.5)
WBC: 10.8 10*3/uL — ABNORMAL HIGH (ref 4.0–10.5)
nRBC: 0 % (ref 0.0–0.2)

## 2020-03-07 LAB — COMPREHENSIVE METABOLIC PANEL
ALT: 13 U/L (ref 0–44)
AST: 27 U/L (ref 15–41)
Albumin: 3.3 g/dL — ABNORMAL LOW (ref 3.5–5.0)
Alkaline Phosphatase: 57 U/L (ref 38–126)
Anion gap: 14 (ref 5–15)
BUN: 23 mg/dL (ref 8–23)
CO2: 29 mmol/L (ref 22–32)
Calcium: 9.3 mg/dL (ref 8.9–10.3)
Chloride: 95 mmol/L — ABNORMAL LOW (ref 98–111)
Creatinine, Ser: 5.77 mg/dL — ABNORMAL HIGH (ref 0.61–1.24)
GFR calc Af Amer: 9 mL/min — ABNORMAL LOW (ref >60)
GFR calc non Af Amer: 8 mL/min — ABNORMAL LOW (ref >60)
Glucose, Bld: 101 mg/dL — ABNORMAL HIGH (ref 70–99)
Potassium: 3.7 mmol/L (ref 3.5–5.1)
Sodium: 138 mmol/L (ref 135–145)
Total Bilirubin: 1.5 mg/dL — ABNORMAL HIGH (ref 0.3–1.2)
Total Protein: 6 g/dL — ABNORMAL LOW (ref 6.5–8.1)

## 2020-03-07 LAB — URINALYSIS, ROUTINE W REFLEX MICROSCOPIC
Bilirubin Urine: NEGATIVE
Glucose, UA: NEGATIVE mg/dL
Ketones, ur: NEGATIVE mg/dL
Nitrite: NEGATIVE
Protein, ur: 100 mg/dL — AB
Specific Gravity, Urine: 1.008 (ref 1.005–1.030)
pH: 9 — ABNORMAL HIGH (ref 5.0–8.0)

## 2020-03-07 LAB — GLUCOSE, CAPILLARY
Glucose-Capillary: 69 mg/dL — ABNORMAL LOW (ref 70–99)
Glucose-Capillary: 79 mg/dL (ref 70–99)
Glucose-Capillary: 95 mg/dL (ref 70–99)
Glucose-Capillary: 51 mg/dL — ABNORMAL LOW (ref 70–99)
Glucose-Capillary: 52 mg/dL — ABNORMAL LOW (ref 70–99)
Glucose-Capillary: 53 mg/dL — ABNORMAL LOW (ref 70–99)
Glucose-Capillary: 59 mg/dL — ABNORMAL LOW (ref 70–99)
Glucose-Capillary: 84 mg/dL (ref 70–99)
Glucose-Capillary: 107 mg/dL — ABNORMAL HIGH (ref 70–99)

## 2020-03-07 LAB — RESPIRATORY PANEL BY RT PCR (FLU A&B, COVID)
Influenza A by PCR: NEGATIVE
Influenza B by PCR: NEGATIVE
SARS Coronavirus 2 by RT PCR: NEGATIVE

## 2020-03-07 LAB — MAGNESIUM: Magnesium: 2.1 mg/dL (ref 1.7–2.4)

## 2020-03-07 LAB — PROCALCITONIN: Procalcitonin: 1.11 ng/mL

## 2020-03-07 MED ORDER — AMOXICILLIN-POT CLAVULANATE 500-125 MG PO TABS
1.0000 | ORAL_TABLET | Freq: Once | ORAL | Status: AC
  Administered 2020-03-07: 13:00:00 500 mg via ORAL
  Filled 2020-03-07: qty 1

## 2020-03-07 MED ORDER — DEXTROSE 50 % IV SOLN: 1.0000 | Freq: Once | INTRAVENOUS | Status: DC

## 2020-03-07 MED ORDER — AMOXICILLIN-POT CLAVULANATE 500-125 MG PO TABS: 500.0000 mg | ORAL_TABLET | Freq: Every day | ORAL | Status: DC

## 2020-03-07 MED ORDER — CHLORHEXIDINE GLUCONATE CLOTH 2 % EX PADS
6.0000 | MEDICATED_PAD | Freq: Every day | CUTANEOUS | Status: DC
  Administered 2020-03-07 – 2020-03-09 (×3): 6 via TOPICAL

## 2020-03-07 MED ORDER — AMOXICILLIN-POT CLAVULANATE 500-125 MG PO TABS
500.0000 mg | ORAL_TABLET | Freq: Every day | ORAL | Status: DC
  Filled 2020-03-07 (×2): qty 1

## 2020-03-07 MED ORDER — CINACALCET HCL 30 MG PO TABS
30.0000 mg | ORAL_TABLET | ORAL | Status: DC
  Filled 2020-03-07: qty 1

## 2020-03-07 MED ORDER — CALCITRIOL 0.25 MCG PO CAPS: 1.5000 ug | ORAL_CAPSULE | ORAL | Status: DC

## 2020-03-07 MED ORDER — AMOXICILLIN-POT CLAVULANATE 875-125 MG PO TABS: 1.0000 | ORAL_TABLET | Freq: Two times a day (BID) | ORAL | Status: DC

## 2020-03-07 MED ORDER — DEXTROSE 50 % IV SOLN
INTRAVENOUS | Status: AC
  Filled 2020-03-07: qty 50

## 2020-03-07 MED ORDER — IPRATROPIUM-ALBUTEROL 0.5-2.5 (3) MG/3ML IN SOLN: 3.0000 mL | RESPIRATORY_TRACT | Status: DC | PRN

## 2020-03-07 MED ORDER — IPRATROPIUM-ALBUTEROL 0.5-2.5 (3) MG/3ML IN SOLN
3.0000 mL | Freq: Three times a day (TID) | RESPIRATORY_TRACT | Status: DC
  Administered 2020-03-07: 3 mL via RESPIRATORY_TRACT
  Filled 2020-03-07: qty 3

## 2020-03-07 NOTE — Progress Notes (Addendum)
PROGRESS NOTE    [name]  JSE:[id number] DOB: 06-28-1929 DOA: 03/06/2020 PCP: [name], MD   Brief Narrative:   86 y.o. male with medical history significant of anemia of chronic disease, iron deficiency, depression, ESRD Monday Wednesday Friday, gout, HLD, orthostatic hypotension brought to the hospital for evaluation of confusion and generalized weakness.  Also his hemoglobin was noted to be 7.0, down from baseline of 10.0 with unclear etiology.   Assessment & Plan:   Principal Problem:   Subdural hematoma (HCC) Active Problems:   Gout   Iron deficiency anemia   Essential hypertension   HLD (hyperlipidemia)   ESRD on dialysis (HCC)   Orthostatic hypotension   Falls   General weakness  Multiple falls with generalized weakness Right-sided frontal parietal subacute hematoma without midline shift or mass-effect -Admit to hospital for further care and management. -Continue neurochecks.  Seen by neurosurgery, no surgical indication.  Closely monitor. -PT/OT-pending -Blood cultures negative.  UA-pending -Supportive care. -Holding aspirin -CT head-subacute subdural hematoma without midline shift.  CT cervical spine-chronic changes.  Chest x-ray-some pleural effusion bilaterally, x-ray of hip and left elbow-negative for any acute pathology.  Acute on chronic iron deficiency anemia, also microcytic Left hip Hematoma. 5.8x 3.7 cm -Unclear etiology, Hemoccult negative. -Hemoglobin 7.8 status post 1 PRBC transfusion. -Iron studies and B12 normal.  TSH normal.  Folate-pending -Transfuse 1 unit PRBC -CT chest abdomen pelvis without contrast -right upper lobe lung opacity, small left hip hematoma. -Discussed with Dr Alvan Dame, orthopedic regarding hip hematoma.  Appears to be consistent due to falls.  Continue monitoring for now.  Acute metabolic encephalopathy; improved -Exam is mostly nonfocal.  Currently alert awake oriented X 2.  Suspect underlying infection.  Ongoing  work-up.  Right Upper Lobe opacity concerning for aspiration pneumonia versus pneumonitis Concerns for aspiration. Nebs Prn. S&S eval.  Check procalcitonin -Empiric Augmentin for 5 days  ESRD hemodialysis Monday Wednesday Friday -Nephro consulted.   No urgent indication for dialysis at this time.  Orthostatic hypotension -On midodrine  Hyperlipidemia -Statin  History of dementia with occasional behavioral disturbance -On Celexa.  Supportive care.  Gout -On allopurinol, currently on hold   DVT prophylaxis: SCDs Code Status: Full  Family Communication:  Spoke with his Son  Status is: Inpatient  Remains inpatient appropriate because:Persistent severe electrolyte disturbances.  Worried about ongoing blood loss and his confusion.  In the meantime he needs speech and swallow evaluation, PT/OT.   Dispo: The patient is from: Home              Anticipated d/c is to: Home              Anticipated d/c date is: 2 days              Patient currently is not medically stable to d/c.  Subjective: Patient seems more awake this morning but he still confused about the place.  Answers basic questions and follows all the commands.  Review of Systems Otherwise negative except as per HPI, including: General: Denies fever, chills, night sweats or unintended weight loss. Resp: Denies cough, wheezing, shortness of breath. Cardiac: Denies chest pain, palpitations, orthopnea, paroxysmal nocturnal dyspnea. GI: Denies abdominal pain, nausea, vomiting, diarrhea or constipation GU: Denies dysuria, frequency, hesitancy or incontinence MS: Denies muscle aches, joint pain or swelling Neuro: Denies headache, neurologic deficits (focal weakness, numbness, tingling), abnormal gait Psych: Denies anxiety, depression, SI/HI/AVH Skin: Denies new rashes or lesions ID: Denies sick contacts, exotic exposures, travel  Examination:  General exam: Appears calm and comfortable  Respiratory system:  Bibasilar crackles Cardiovascular system: S1 & S2 heard, RRR. No JVD, murmurs, rubs, gallops or clicks. No pedal edema. Gastrointestinal system: Abdomen is nondistended, soft and nontender. No organomegaly or masses felt. Normal bowel sounds heard. Central nervous system: Alert and oriented. No focal neurological deficits. Extremities: Symmetric 4 x 5 power. Skin: No rashes, lesions or ulcers Psychiatry: Poor judgment and insight, alert to name only   Objective: Vitals:   03/07/20 0008 03/07/20 0524 03/07/20 0754 03/07/20 0814  BP: (!) 145/75 (!) 150/76  (!) 148/68  Pulse: 85 86  88  Resp: 18 18  18   Temp: 97.8 F (36.6 C) 97.6 F (36.4 C)  97.8 F (36.6 C)  TempSrc: Oral Oral  Oral  SpO2: 95% 93% 95% (!) 88%  Weight: 77.1 kg     Height: 5\' 9"  (1.753 m)       Intake/Output Summary (Last 24 hours) at 03/07/2020 0941 Last data filed at 03/06/2020 1857 Gross per 24 hour  Intake 108.58 ml  Output --  Net 108.58 ml   Filed Weights   03/06/20 1159 03/07/20 0008  Weight: 68 kg 77.1 kg     Data Reviewed:   CBC: Recent Labs  Lab 03/06/20 1229 03/07/20 0358  WBC 11.1* 10.8*  HGB 7.0* 7.8*  HCT 22.6* 24.5*  MCV 100.9* 95.7  PLT 232 166   Basic Metabolic Panel: Recent Labs  Lab 03/06/20 1229 03/07/20 0358  NA 137 138  K 3.7 3.7  CL 90* 95*  CO2 30 29  GLUCOSE 115* 101*  BUN 17 23  CREATININE 4.98* 5.77*  CALCIUM 9.5 9.3  MG  --  2.1   GFR: Estimated Creatinine Clearance: 8.8 mL/min (A) (by C-G formula based on SCr of 5.77 mg/dL (H)). Liver Function Tests: Recent Labs  Lab 03/06/20 1437 03/07/20 0358  AST 27 27  ALT 13 13  ALKPHOS 58 57  BILITOT 1.2 1.5*  PROT 6.3* 6.0*  ALBUMIN 3.4* 3.3*   No results for input(s): LIPASE, AMYLASE in the last 168 hours. Recent Labs  Lab 03/06/20 1437  AMMONIA 18   Coagulation Profile: No results for input(s): INR, PROTIME in the last 168 hours. Cardiac Enzymes: Recent Labs  Lab 03/06/20 1745  CKTOTAL 181    BNP (last 3 results) No results for input(s): PROBNP in the last 8760 hours. HbA1C: Recent Labs    03/06/20 1745  HGBA1C 5.5   CBG: Recent Labs  Lab 03/06/20 2143 03/07/20 0642 03/07/20 0658  GLUCAP 100* 69* 79   Lipid Profile: No results for input(s): CHOL, HDL, LDLCALC, TRIG, CHOLHDL, LDLDIRECT in the last 72 hours. Thyroid Function Tests: Recent Labs    03/06/20 1437  TSH 1.228   Anemia Panel: Recent Labs    03/06/20 1745  VITAMINB12 486  FERRITIN 1,884*   Sepsis Labs: No results for input(s): PROCALCITON, LATICACIDVEN in the last 168 hours.  Recent Results (from the past 240 hour(s))  Blood culture (routine x 2)     Status: None (Preliminary result)   Collection Time: 03/06/20  2:37 PM   Specimen: BLOOD RIGHT FOREARM  Result Value Ref Range Status   Specimen Description BLOOD RIGHT FOREARM  Final   Special Requests   Final    BOTTLES DRAWN AEROBIC AND ANAEROBIC Blood Culture results may not be optimal due to an inadequate volume of blood received in culture bottles   Culture   Final    NO  GROWTH < 24 HOURS Performed at [facility], [place] [address], [place], [place] [address]    Report Status PENDING  Incomplete  Blood culture (routine x 2)     Status: None (Preliminary result)   Collection Time: 03/06/20  3:15 PM   Specimen: BLOOD  Result Value Ref Range Status   Specimen Description BLOOD RIGHT ANTECUBITAL  Final   Special Requests   Final    BOTTLES DRAWN AEROBIC AND ANAEROBIC Blood Culture results may not be optimal due to an inadequate volume of blood received in culture bottles   Culture   Final    NO GROWTH < 24 HOURS Performed at [facility], [place] [address] [place], [place] [address]    Report Status PENDING  Incomplete  Respiratory Panel by RT PCR (Flu A&B, Covid) - Nasopharyngeal Swab     Status: None   Collection Time: 03/06/20 11:15 PM   Specimen: Nasopharyngeal Swab  Result Value Ref Range Status   SARS Coronavirus 2 by  RT PCR NEGATIVE NEGATIVE Final    Comment: (NOTE) SARS-CoV-2 target nucleic acids are NOT DETECTED. The SARS-CoV-2 RNA is generally detectable in upper respiratoy specimens during the acute phase of infection. The lowest concentration of SARS-CoV-2 viral copies this assay can detect is 131 copies/mL. A negative result does not preclude SARS-Cov-2 infection and should not be used as the sole basis for treatment or other patient management decisions. A negative result may occur with  improper specimen collection/handling, submission of specimen other than nasopharyngeal swab, presence of viral mutation(s) within the areas targeted by this assay, and inadequate number of viral copies (<131 copies/mL). A negative result must be combined with clinical observations, patient history, and epidemiological information. The expected result is Negative. Fact Sheet for Patients:  PinkCheek.be Fact Sheet for Healthcare Providers:  GravelBags.it This test is not yet ap proved or cleared by the [place] FDA and  has been authorized for detection and/or diagnosis of SARS-CoV-2 by FDA under an Emergency Use Authorization (EUA). This EUA will remain  in effect (meaning this test can be used) for the duration of the COVID-19 declaration under Section 564(b)(1) of the Act, 21 U.S.C. section 360bbb-3(b)(1), unless the authorization is terminated or revoked sooner.    Influenza A by PCR NEGATIVE NEGATIVE Final   Influenza B by PCR NEGATIVE NEGATIVE Final    Comment: (NOTE) The Xpert Xpress SARS-CoV-2/FLU/RSV assay is intended as an aid in  the diagnosis of influenza from Nasopharyngeal swab specimens and  should not be used as a sole basis for treatment. Nasal washings and  aspirates are unacceptable for Xpert Xpress SARS-CoV-2/FLU/RSV  testing. Fact Sheet for Patients: PinkCheek.be Fact Sheet for Healthcare  Providers: GravelBags.it This test is not yet approved or cleared by the [place] FDA and  has been authorized for detection and/or diagnosis of SARS-CoV-2 by  FDA under an Emergency Use Authorization (EUA). This EUA will remain  in effect (meaning this test can be used) for the duration of the  Covid-19 declaration under Section 564(b)(1) of the Act, 21  U.S.C. section 360bbb-3(b)(1), unless the authorization is  terminated or revoked. Performed at [facility], [place] [address] [place], [place] [address]          Radiology Studies: CT ABDOMEN PELVIS WO CONTRAST  Result Date: 03/06/2020 CLINICAL DATA:  Increasing falls. Swelling to the left arm and bruising over the right hip. Generalized weakness. EXAM: CT CHEST, ABDOMEN AND PELVIS WITHOUT CONTRAST  TECHNIQUE: Multidetector CT imaging of the chest, abdomen and pelvis was performed following the standard protocol without IV contrast. COMPARISON:  November 10, 2019 FINDINGS: CT CHEST FINDINGS Cardiovascular: The heart is enlarged. There is no significant pericardial effusion. The intracardiac blood pool is hypodense relative to the adjacent myocardium consistent with anemia. There are atherosclerotic changes of the thoracic aorta without evidence for an aneurysm. There is a well-positioned tunneled dialysis catheter with tip terminating in the high right atrium. Mediastinum/Nodes: --No mediastinal or hilar lymphadenopathy. --No axillary lymphadenopathy. --No supraclavicular lymphadenopathy. --Normal thyroid gland. --The esophagus is unremarkable Lungs/Pleura: There are moderate-sized bilateral pleural effusions, right greater than left. There is interlobular septal thickening bilaterally. There is a ground-glass airspace opacity in the right upper lobe (axial series 5, image 59). There is no pneumothorax. There is some bronchial wall thickening and mucus plugging at the lung bases bilaterally. The trachea is  unremarkable. Musculoskeletal: There are healing left-sided rib fractures. CT ABDOMEN PELVIS FINDINGS Hepatobiliary: There is a large cyst in the right hepatic lobe which is stable from prior study. Normal gallbladder.There is no biliary ductal dilation. Pancreas: Normal contours without ductal dilatation. No peripancreatic fluid collection. Spleen: Unremarkable. Adrenals/Urinary Tract: --Adrenal glands: There is a stable benign lipid rich left adrenal adenoma. --Right kidney/ureter: The right kidney is atrophic. --Left kidney/ureter: The left kidney is atrophic. --Urinary bladder: Unremarkable. Stomach/Bowel: --Stomach/Duodenum: No hiatal hernia or other gastric abnormality. Normal duodenal course and caliber. --Small bowel: Unremarkable. --Colon: There is scattered colonic diverticula without CT evidence for diverticulitis. --Appendix: Normal. Vascular/Lymphatic: Atherosclerotic calcification is present within the non-aneurysmal abdominal aorta, without hemodynamically significant stenosis. --No retroperitoneal lymphadenopathy. --No mesenteric lymphadenopathy. --No pelvic or inguinal lymphadenopathy. Reproductive: Unremarkable Other: There is a small amount of free fluid in the patient's abdomen and pelvis. There is no free air. Musculoskeletal. There is a hematoma overlying the proximal lateral left hip measuring approximately 5.8 x 3.7 cm. The patient is status post total hip arthroplasty on the left. The hardware appears intact where visualized. There is a chronic appearing fracture of the superior pubic ramus on the left. IMPRESSION: 1. Moderate-sized bilateral pleural effusions, right greater than left, with interlobular septal thickening, suggestive of interstitial pulmonary edema. 2. Ground-glass airspace opacity in the right upper lobe, concerning for pneumonia. 3. Status post left total hip arthroplasty. There is a 5.8 x 3.7 cm hematoma overlying the proximal lateral left hip. 4. Atrophic kidneys. 5.  Colonic diverticulosis without CT evidence for diverticulitis. Aortic Atherosclerosis (ICD10-I70.0). Electronically Signed   By: Constance Holster M.D.   On: 03/06/2020 19:20   DG Chest 2 View  Result Date: 03/06/2020 CLINICAL DATA:  Falls over the past week.  Altered mental status. EXAM: CHEST - 2 VIEW COMPARISON:  12/08/2016 FINDINGS: Right IJ central venous catheter with tip over the SVC. Vascular stent projects over the left subclavian region and also over the medial left upper arm. Lungs are adequately inflated with minimal layering bilateral pleural fluid. No definite focal airspace process. No pneumothorax. Borderline stable cardiomegaly. No acute fracture. IMPRESSION: 1.  Small bilateral layering pleural effusions. 2. Stable borderline cardiomegaly. Right IJ central venous catheter with tip over the SVC. Electronically Signed   By: Marin Olp M.D.   On: 03/06/2020 14:54   DG Elbow Complete Left  Result Date: 03/06/2020 CLINICAL DATA:  Several falls over the past week with left elbow pain. EXAM: LEFT ELBOW - COMPLETE 3+ VIEW COMPARISON:  None. FINDINGS: Minimal degenerative changes over the elbow. Displaced anterior fat pad  likely joint effusion. No definite fracture visualized. Partial visualization of a vascular stent over the medial upper arm. Surgical clips over the soft tissues of the elbow. IMPRESSION: Joint effusion.  No definite fracture visualized. Electronically Signed   By: Marin Olp M.D.   On: 03/06/2020 14:57   CT Head Wo Contrast  Result Date: 03/06/2020 CLINICAL DATA:  Altered mental status. Increasing phones. EXAM: CT HEAD WITHOUT CONTRAST TECHNIQUE: Contiguous axial images were obtained from the base of the skull through the vertex without intravenous contrast. COMPARISON:  MRI brain from 07/05/2015 FINDINGS: Brain: Small chronic lacunar infarct in the head of the right caudate nucleus. Periventricular white matter and corona radiata hypodensities favor chronic ischemic  microvascular white matter disease. New right frontoparietal temporal subacute subdural hematoma with lower density component anteriorly and higher density component posteriorly. Anterior components up to about 8 mm in thickness. No significant degree of midline shift. No appreciable acute CVA or mass lesion. Vascular: There is atherosclerotic calcification of the cavernous carotid arteries bilaterally. Skull: Unremarkable Sinuses/Orbits: As on the prior temporal bone CT of 08/09/2019, there is abnormal thickening of the osseous portion of the external auditory canal with some bony erosions. Other: No supplemental non-categorized findings. IMPRESSION: 1. Subacute right frontoparietotemporal subdural hematoma, 8 mm in thickness. No significant degree of midline shift. 2. Periventricular white matter and corona radiata hypodensities favor chronic ischemic microvascular white matter disease. 3. Small chronic lacunar infarct in the head of the right caudate nucleus. 4. Abnormal thickening of the osseous portion of the right external auditory canal with some bony erosions, query external auditory canal cholesteatoma. This is similar to that shown of the temporal bone CT of 08/19/2019. Electronically Signed   By: Van Clines M.D.   On: 03/06/2020 16:06   CT CHEST WO CONTRAST  Result Date: 03/06/2020 CLINICAL DATA:  Increasing falls. Swelling to the left arm and bruising over the right hip. Generalized weakness. EXAM: CT CHEST, ABDOMEN AND PELVIS WITHOUT CONTRAST TECHNIQUE: Multidetector CT imaging of the chest, abdomen and pelvis was performed following the standard protocol without IV contrast. COMPARISON:  November 10, 2019 FINDINGS: CT CHEST FINDINGS Cardiovascular: The heart is enlarged. There is no significant pericardial effusion. The intracardiac blood pool is hypodense relative to the adjacent myocardium consistent with anemia. There are atherosclerotic changes of the thoracic aorta without evidence for an  aneurysm. There is a well-positioned tunneled dialysis catheter with tip terminating in the high right atrium. Mediastinum/Nodes: --No mediastinal or hilar lymphadenopathy. --No axillary lymphadenopathy. --No supraclavicular lymphadenopathy. --Normal thyroid gland. --The esophagus is unremarkable Lungs/Pleura: There are moderate-sized bilateral pleural effusions, right greater than left. There is interlobular septal thickening bilaterally. There is a ground-glass airspace opacity in the right upper lobe (axial series 5, image 59). There is no pneumothorax. There is some bronchial wall thickening and mucus plugging at the lung bases bilaterally. The trachea is unremarkable. Musculoskeletal: There are healing left-sided rib fractures. CT ABDOMEN PELVIS FINDINGS Hepatobiliary: There is a large cyst in the right hepatic lobe which is stable from prior study. Normal gallbladder.There is no biliary ductal dilation. Pancreas: Normal contours without ductal dilatation. No peripancreatic fluid collection. Spleen: Unremarkable. Adrenals/Urinary Tract: --Adrenal glands: There is a stable benign lipid rich left adrenal adenoma. --Right kidney/ureter: The right kidney is atrophic. --Left kidney/ureter: The left kidney is atrophic. --Urinary bladder: Unremarkable. Stomach/Bowel: --Stomach/Duodenum: No hiatal hernia or other gastric abnormality. Normal duodenal course and caliber. --Small bowel: Unremarkable. --Colon: There is scattered colonic diverticula without CT evidence for  diverticulitis. --Appendix: Normal. Vascular/Lymphatic: Atherosclerotic calcification is present within the non-aneurysmal abdominal aorta, without hemodynamically significant stenosis. --No retroperitoneal lymphadenopathy. --No mesenteric lymphadenopathy. --No pelvic or inguinal lymphadenopathy. Reproductive: Unremarkable Other: There is a small amount of free fluid in the patient's abdomen and pelvis. There is no free air. Musculoskeletal. There is a  hematoma overlying the proximal lateral left hip measuring approximately 5.8 x 3.7 cm. The patient is status post total hip arthroplasty on the left. The hardware appears intact where visualized. There is a chronic appearing fracture of the superior pubic ramus on the left. IMPRESSION: 1. Moderate-sized bilateral pleural effusions, right greater than left, with interlobular septal thickening, suggestive of interstitial pulmonary edema. 2. Ground-glass airspace opacity in the right upper lobe, concerning for pneumonia. 3. Status post left total hip arthroplasty. There is a 5.8 x 3.7 cm hematoma overlying the proximal lateral left hip. 4. Atrophic kidneys. 5. Colonic diverticulosis without CT evidence for diverticulitis. Aortic Atherosclerosis (ICD10-I70.0). Electronically Signed   By: Constance Holster M.D.   On: 03/06/2020 19:20   CT Cervical Spine Wo Contrast  Result Date: 03/06/2020 CLINICAL DATA:  Fall, altered mental status. EXAM: CT CERVICAL SPINE WITHOUT CONTRAST TECHNIQUE: Multidetector CT imaging of the cervical spine was performed without intravenous contrast. Multiplanar CT image reconstructions were also generated. COMPARISON:  CT cervical spine 08/17/2017 FINDINGS: Alignment: Continued abnormal predental space, with 5 mm of space between the anterior arch of C1 in the odontoid, previously 4.5 mm back on 08/17/2017 by my measurements. The appearance suggests some degree of atlantoaxial instability, but clearly seems to be a chronic finding. There is chronic grade 1 mild posterior subluxation at C3-4. Skull base and vertebrae: Acquired interbody fusions likely through fused spurring at C3-4, C4-5, C5-6, and potentially C7-T1. Fused facet joints on the left at C4-5 and C5-6. No acute fracture is identified. Severe spondylosis noted. Soft tissues and spinal canal: There is likely at least mild central narrowing of the thecal sac at the C1 level due to the atlantoaxial subluxation and a small amount of  pannus posterior to the odontoid. Right central line noted. Bilateral common carotid atherosclerotic calcification. Disc levels: Uncinate and facet spurring causes right foraminal impingement at C2-3, C3-4, C6-7, and potentially C7-T1; and left foraminal impingement at C3-4, C4-5, and C7-T1. Upper chest: Branch vessel atherosclerotic vascular calcification. Suspected right pleural effusion. Other: No supplemental non-categorized findings. IMPRESSION: 1. No acute cervical spine findings. 2. Continued abnormal predental space, with 5 mm of space between the anterior arch of C1 in the odontoid, previously 4.5 mm back on 08/17/2017 by my measurements. The appearance suggests chronic atlantoaxial instability/subluxation, but clearly seems to be a chronic finding. This is likely contributing to central narrowing of the thecal sac at the C1 level. 3. Severe spondylosis causing multilevel impingement. 4. Suspected right pleural effusion. 5. Atherosclerosis. Critical Value/emergent results were called by telephone at the time of interpretation on 03/06/2020 at 4:14 pm to provider Dr, Eugenio Hoes, who verbally acknowledged these results. In particular we discussed the cervical spine findings and also the intracranial findings including the presence of the subacute right subdural hematoma. Electronically Signed   By: Van Clines M.D.   On: 03/06/2020 16:14   DG Hips Bilat W or Wo Pelvis 5 Views  Result Date: 03/06/2020 CLINICAL DATA:  Recent falls with right hip pain. EXAM: DG HIP (WITH OR WITHOUT PELVIS) 5+V BILAT COMPARISON:  02/23/2015 FINDINGS: Left hip arthroplasty intact and unchanged. Mild degenerative changes of the right hip. Degenerative change  of the spine and sacroiliac joints. No acute fracture or dislocation. IMPRESSION: No acute findings. Electronically Signed   By: Marin Olp M.D.   On: 03/06/2020 14:55        Scheduled Meds: . amoxicillin-clavulanate  1 tablet Oral Q12H  . citalopram  10 mg  Oral Daily  . ferric citrate  420 mg Oral TID WC  . insulin aspart  0-5 Units Subcutaneous QHS  . insulin aspart  0-6 Units Subcutaneous TID WC  . insulin aspart  2 Units Subcutaneous TID WC  . midodrine  10 mg Oral TID WC  . multivitamin with minerals  1 tablet Oral Daily  . rosuvastatin  20 mg Oral QHS   Continuous Infusions:   LOS: 1 day   Time spent= 35 mins    Charlee Squibb Arsenio Loader, MD [facility]  If 7PM-7AM, please contact night-coverage  03/07/2020, 9:41 AM

## 2020-03-07 NOTE — Consult Note (Signed)
[facility] Renal Consultation Note    Indication for Consultation:  Management of ESRD/hemodialysis, anemia, hypertension/volume, and secondary hyperparathyroidism.  HPI: [name] is a 86 y.o. male with PMH including ESRD on dialysis, dementia, orthostatic hypotension, and gout who presented to the ED on 03/06/20 with confusion and falls. Patient's son is at bedside and provides most of the history today. Reports patient has had increasing confusion worse than his baseline for about 1.5 weeks. Reports he saw him Wednesday and noticed bruising on his right elbow and hip and thinks he fell Wednesay morning, though patient has no memory of this. He reportedly saw his PCP about 3 weeks ago and was treated for a UTI but had persistent confusion after this. He did complete dialysis as scheduled this week without any reported complications.  In the ED, CT head showed subacute subdural hematoma. Neurology was consulted and recommended no surgical intervention. CXR showed small bilateral pleural effusions. Hgb 7.0, hemoccult negative, and CT of the abdomen showed R upper lobe lung opacity and L hip hematoma. Patient received 1 unit PRBC. K+ 3.7, Cr 4.98, BUN 17, Co2 30, Ca 9.5 and Alb 3.4. He was started on augmentin for suspected aspiration pneumonia.   On exam, patient reports he is feeling fairly well. He does report right elbow pain and swelling. He denies SOB, orthopnea, cough, CP, palpitations, headache, blurry vision, dizziness, abdominal pain, N/V/D, melena and hematochezia. Reports he makes a small amount of urine and has not had any dysuria or flank pain.   Past Medical History:  Diagnosis Date  . ANEMIA-IRON DEFICIENCY 03/09/2008  . Arthritis    cervical spine.   . Complex renal cyst 06/19/2011  . Depression 03/26/2014  . DIVERTICULOSIS, COLON 03/09/2008  . ESRD (end stage renal disease) ([place]) 03/09/2008  . Flu 12/08/2016  . GOUT 03/09/2008  . Hemorrhoids 08/2009   internal.   .  History of blood transfusion   . HYPERLIPIDEMIA 03/09/2008  . Hyperparathyroidism ([place])   . HYPERTENSION 03/09/2008  . Prostate cancer [facility]) 2002   Completed external beam radiation 2003.per HPI  . PROSTATE CANCER, HX OF 03/09/2008  . Radiation cystitis 2010.  Mar[facility] Kitchen Stroke [facility])    Past Surgical History:  Procedure Laterality Date  . AV FISTULA PLACEMENT Left 01/24/2013   Procedure: INSERTION OF ARTERIOVENOUS (AV) GORE-TEX GRAFT ARM;  Surgeon: Rosetta Posner, MD;  Location: [place];  Service: Vascular;  Laterality: Left;  . COLONOSCOPY N/A 06/17/2015   Procedure: COLONOSCOPY;  Surgeon: Gatha Mayer, MD;  Location: [place];  Service: Endoscopy;  Laterality: N/A;  . ESOPHAGOGASTRODUODENOSCOPY N/A 06/17/2015   Procedure: ESOPHAGOGASTRODUODENOSCOPY (EGD);  Surgeon: Gatha Mayer, MD;  Location: [facility] ENDOSCOPY;  Service: Endoscopy;  Laterality: N/A;  . FLEXIBLE SIGMOIDOSCOPY N/A 06/15/2015   Procedure: FLEXIBLE SIGMOIDOSCOPY;  Surgeon: Gatha Mayer, MD;  Location: [place];  Service: Endoscopy;  Laterality: N/A;  . GIVENS CAPSULE STUDY N/A 07/06/2015   Procedure: GIVENS CAPSULE STUDY;  Surgeon: Milus Banister, MD;  Location: [place];  Service: Endoscopy;  Laterality: N/A;  . JOINT REPLACEMENT     HIP  . rotator cuff repair right  4/08  . s/p left hip replacement  2007   Dr. Percell Miller ortho  . TONSILLECTOMY     Family History  Problem Relation Age of Onset  . Hypertension Father   . Diabetes Father   . Cancer Father        prostate cancer  . Hypertension Brother   .  Cancer Brother    Social History:  reports that he quit smoking about 37 years ago. His smoking use included cigarettes. He quit after 5.00 years of use. He has never used smokeless tobacco. He reports that he does not drink alcohol or use drugs.  ROS: As per HPI otherwise negative.   Physical Exam: Vitals:   03/07/20 0008 03/07/20 0524 03/07/20 0754 03/07/20 0814  BP: (!) 145/75 (!) 150/76  (!) 148/68  Pulse: 85 86   88  Resp: 18 18  18   Temp: 97.8 F (36.6 C) 97.6 F (36.4 C)  97.8 F (36.6 C)  TempSrc: Oral Oral  Oral  SpO2: 95% 93% 95% (!) 88%  Weight: 77.1 kg     Height: 5\' 9"  (1.753 m)        General: Well developed, well nourished male, in no acute distress. Head: Normocephalic, atraumatic, sclera non-icteric, mucus membranes are moist. Neck:JVD not elevated. Lungs: Clear bilaterally to auscultation without wheezes, rales, or rhonchi. Breathing is unlabored. Heart: RRR with normal S1, S2. No murmurs, rubs, or gallops appreciated. Abdomen: Soft, non-tender, non-distended with normoactive bowel sounds. No rebound/guarding. No obvious abdominal masses. Musculoskeletal:  Strength and tone appear normal for age. + hematoma noted r forearm/elbow with localized edema. Lower extremities: No edema bilateral lower extremities Neuro: Alert, answers most questions appropriately but requires some redirection Psych:  Pleasant and calm Dialysis Access: R IJ TDC, LUE AVG with no thrill/bruit  No Known Allergies Prior to Admission medications   Medication Sig Start Date End Date Taking? Authorizing Provider  allopurinol (ZYLOPRIM) 100 MG tablet TAKE 1 TABLET BY MOUTH EVERY DAY Patient taking differently: Take 100 mg by mouth daily.  03/03/20  Yes [name], MD  aspirin EC 81 MG tablet Take 1 tablet (81 mg total) by mouth daily. 12/12/16  Yes Rosalin Hawking, MD  AURYXIA 1 GM 210 MG(Fe) tablet Take 420 mg by mouth 3 (three) times daily with meals.  04/30/18  Yes [provider]  citalopram (CELEXA) 10 MG tablet Take 1 tablet (10 mg total) by mouth daily. 01/12/20  Yes [name], MD  latanoprost (XALATAN) 0.005 % ophthalmic solution Place 1 drop into both eyes at bedtime.  05/05/18  Yes [provider]  lidocaine-prilocaine (EMLA) cream Apply 1 application topically See admin instructions. Apply to access site 1 hour before dialysis. Cover with saran wrap 06/07/18  Yes [provider]   midodrine (PROAMATINE) 10 MG tablet Take 1 tablet (10 mg total) by mouth 3 (three) times daily. Take in the morning, lunch time and 4pm. Do not take after 4pm. 12/15/19  Yes [name], MD  Multiple Vitamin (MULTIVITAMIN WITH MINERALS) TABS tablet Take 1 tablet by mouth daily.   Yes [provider]  nystatin (MYCOSTATIN/NYSTOP) powder Use as directed twice per day Patient taking differently: Apply 1 g topically 2 (two) times daily as needed (rash).  07/06/16  Yes [name], MD  rosuvastatin (CRESTOR) 20 MG tablet Take 1 tablet (20 mg total) by mouth at bedtime. 12/15/19  Yes [name], MD  Vitamin D, Ergocalciferol, (DRISDOL) 1.25 MG (50000 UNIT) CAPS capsule Take 1 capsule (50,000 Units total) by mouth every 7 (seven) days. 12/04/19  Yes [name], MD   Current Facility-Administered Medications  Medication Dose Route Frequency Provider Last Rate Last Admin  . amoxicillin-clavulanate (AUGMENTIN) 875-125 MG per tablet 1 tablet  1 tablet Oral Q12H Amin, Jeanella Flattery, MD      .  citalopram (CELEXA) tablet 10 mg  10 mg Oral Daily Amin, Ankit Chirag, MD   10 mg at 03/07/20 0907  . ferric citrate (AURYXIA) tablet 420 mg  420 mg Oral TID WC Amin, Ankit Chirag, MD   420 mg at 03/07/20 0908  . HYDROcodone-acetaminophen (NORCO/VICODIN) 5-325 MG per tablet 1-2 tablet  1-2 tablet Oral Q6H PRN Amin, Ankit Chirag, MD      . insulin aspart (novoLOG) injection 0-5 Units  0-5 Units Subcutaneous QHS Amin, Ankit Chirag, MD      . insulin aspart (novoLOG) injection 0-6 Units  0-6 Units Subcutaneous TID WC Amin, Ankit Chirag, MD      . insulin aspart (novoLOG) injection 2 Units  2 Units Subcutaneous TID WC Amin, Ankit Chirag, MD      . ipratropium-albuterol (DUONEB) 0.5-2.5 (3) MG/3ML nebulizer solution 3 mL  3 mL Nebulization Q4H PRN Amin, Ankit Chirag, MD      . midodrine (PROAMATINE) tablet 10 mg  10 mg Oral TID WC Amin, Ankit Chirag, MD   10 mg at 03/07/20 0906  . multivitamin with minerals  tablet 1 tablet  1 tablet Oral Daily Damita Lack, MD   1 tablet at 03/07/20 0907  . ondansetron (ZOFRAN) tablet 4 mg  4 mg Oral Q6H PRN Amin, Ankit Chirag, MD       Or  . ondansetron (ZOFRAN) injection 4 mg  4 mg Intravenous Q6H PRN Amin, Ankit Chirag, MD      . polyethylene glycol (MIRALAX / GLYCOLAX) packet 17 g  17 g Oral Daily PRN Amin, Ankit Chirag, MD      . rosuvastatin (CRESTOR) tablet 20 mg  20 mg Oral QHS Amin, Ankit Chirag, MD   20 mg at 03/06/20 2243  . senna-docusate (Senokot-S) tablet 2 tablet  2 tablet Oral QHS PRN Damita Lack, MD       Labs: Basic Metabolic Panel: Recent Labs  Lab 03/06/20 1229 03/07/20 0358  NA 137 138  K 3.7 3.7  CL 90* 95*  CO2 30 29  GLUCOSE 115* 101*  BUN 17 23  CREATININE 4.98* 5.77*  CALCIUM 9.5 9.3   Liver Function Tests: Recent Labs  Lab 03/06/20 1437 03/07/20 0358  AST 27 27  ALT 13 13  ALKPHOS 58 57  BILITOT 1.2 1.5*  PROT 6.3* 6.0*  ALBUMIN 3.4* 3.3*   No results for input(s): LIPASE, AMYLASE in the last 168 hours. Recent Labs  Lab 03/06/20 1437  AMMONIA 18   CBC: Recent Labs  Lab 03/06/20 1229 03/07/20 0358  WBC 11.1* 10.8*  HGB 7.0* 7.8*  HCT 22.6* 24.5*  MCV 100.9* 95.7  PLT 232 206   Cardiac Enzymes: Recent Labs  Lab 03/06/20 1745  CKTOTAL 181   CBG: Recent Labs  Lab 03/06/20 2143 03/07/20 0642 03/07/20 0658  GLUCAP 100* 69* 79   Iron Studies:  Recent Labs    03/06/20 1745  FERRITIN 1,884*   Studies/Results: CT ABDOMEN PELVIS WO CONTRAST  Result Date: 03/06/2020 CLINICAL DATA:  Increasing falls. Swelling to the left arm and bruising over the right hip. Generalized weakness. EXAM: CT CHEST, ABDOMEN AND PELVIS WITHOUT CONTRAST TECHNIQUE: Multidetector CT imaging of the chest, abdomen and pelvis was performed following the standard protocol without IV contrast. COMPARISON:  November 10, 2019 FINDINGS: CT CHEST FINDINGS Cardiovascular: The heart is enlarged. There is no significant  pericardial effusion. The intracardiac blood pool is hypodense relative to the adjacent myocardium consistent with anemia. There are atherosclerotic changes  of the thoracic aorta without evidence for an aneurysm. There is a well-positioned tunneled dialysis catheter with tip terminating in the high right atrium. Mediastinum/Nodes: --No mediastinal or hilar lymphadenopathy. --No axillary lymphadenopathy. --No supraclavicular lymphadenopathy. --Normal thyroid gland. --The esophagus is unremarkable Lungs/Pleura: There are moderate-sized bilateral pleural effusions, right greater than left. There is interlobular septal thickening bilaterally. There is a ground-glass airspace opacity in the right upper lobe (axial series 5, image 59). There is no pneumothorax. There is some bronchial wall thickening and mucus plugging at the lung bases bilaterally. The trachea is unremarkable. Musculoskeletal: There are healing left-sided rib fractures. CT ABDOMEN PELVIS FINDINGS Hepatobiliary: There is a large cyst in the right hepatic lobe which is stable from prior study. Normal gallbladder.There is no biliary ductal dilation. Pancreas: Normal contours without ductal dilatation. No peripancreatic fluid collection. Spleen: Unremarkable. Adrenals/Urinary Tract: --Adrenal glands: There is a stable benign lipid rich left adrenal adenoma. --Right kidney/ureter: The right kidney is atrophic. --Left kidney/ureter: The left kidney is atrophic. --Urinary bladder: Unremarkable. Stomach/Bowel: --Stomach/Duodenum: No hiatal hernia or other gastric abnormality. Normal duodenal course and caliber. --Small bowel: Unremarkable. --Colon: There is scattered colonic diverticula without CT evidence for diverticulitis. --Appendix: Normal. Vascular/Lymphatic: Atherosclerotic calcification is present within the non-aneurysmal abdominal aorta, without hemodynamically significant stenosis. --No retroperitoneal lymphadenopathy. --No mesenteric lymphadenopathy.  --No pelvic or inguinal lymphadenopathy. Reproductive: Unremarkable Other: There is a small amount of free fluid in the patient's abdomen and pelvis. There is no free air. Musculoskeletal. There is a hematoma overlying the proximal lateral left hip measuring approximately 5.8 x 3.7 cm. The patient is status post total hip arthroplasty on the left. The hardware appears intact where visualized. There is a chronic appearing fracture of the superior pubic ramus on the left. IMPRESSION: 1. Moderate-sized bilateral pleural effusions, right greater than left, with interlobular septal thickening, suggestive of interstitial pulmonary edema. 2. Ground-glass airspace opacity in the right upper lobe, concerning for pneumonia. 3. Status post left total hip arthroplasty. There is a 5.8 x 3.7 cm hematoma overlying the proximal lateral left hip. 4. Atrophic kidneys. 5. Colonic diverticulosis without CT evidence for diverticulitis. Aortic Atherosclerosis (ICD10-I70.0). Electronically Signed   By: Constance Holster M.D.   On: 03/06/2020 19:20   DG Chest 2 View  Result Date: 03/06/2020 CLINICAL DATA:  Falls over the past week.  Altered mental status. EXAM: CHEST - 2 VIEW COMPARISON:  12/08/2016 FINDINGS: Right IJ central venous catheter with tip over the SVC. Vascular stent projects over the left subclavian region and also over the medial left upper arm. Lungs are adequately inflated with minimal layering bilateral pleural fluid. No definite focal airspace process. No pneumothorax. Borderline stable cardiomegaly. No acute fracture. IMPRESSION: 1.  Small bilateral layering pleural effusions. 2. Stable borderline cardiomegaly. Right IJ central venous catheter with tip over the SVC. Electronically Signed   By: Marin Olp M.D.   On: 03/06/2020 14:54   DG Elbow Complete Left  Result Date: 03/06/2020 CLINICAL DATA:  Several falls over the past week with left elbow pain. EXAM: LEFT ELBOW - COMPLETE 3+ VIEW COMPARISON:  None.  FINDINGS: Minimal degenerative changes over the elbow. Displaced anterior fat pad likely joint effusion. No definite fracture visualized. Partial visualization of a vascular stent over the medial upper arm. Surgical clips over the soft tissues of the elbow. IMPRESSION: Joint effusion.  No definite fracture visualized. Electronically Signed   By: Marin Olp M.D.   On: 03/06/2020 14:57   CT Head Wo Contrast  Result  Date: 03/06/2020 CLINICAL DATA:  Altered mental status. Increasing phones. EXAM: CT HEAD WITHOUT CONTRAST TECHNIQUE: Contiguous axial images were obtained from the base of the skull through the vertex without intravenous contrast. COMPARISON:  MRI brain from 07/05/2015 FINDINGS: Brain: Small chronic lacunar infarct in the head of the right caudate nucleus. Periventricular white matter and corona radiata hypodensities favor chronic ischemic microvascular white matter disease. New right frontoparietal temporal subacute subdural hematoma with lower density component anteriorly and higher density component posteriorly. Anterior components up to about 8 mm in thickness. No significant degree of midline shift. No appreciable acute CVA or mass lesion. Vascular: There is atherosclerotic calcification of the cavernous carotid arteries bilaterally. Skull: Unremarkable Sinuses/Orbits: As on the prior temporal bone CT of 08/09/2019, there is abnormal thickening of the osseous portion of the external auditory canal with some bony erosions. Other: No supplemental non-categorized findings. IMPRESSION: 1. Subacute right frontoparietotemporal subdural hematoma, 8 mm in thickness. No significant degree of midline shift. 2. Periventricular white matter and corona radiata hypodensities favor chronic ischemic microvascular white matter disease. 3. Small chronic lacunar infarct in the head of the right caudate nucleus. 4. Abnormal thickening of the osseous portion of the right external auditory canal with some bony  erosions, query external auditory canal cholesteatoma. This is similar to that shown of the temporal bone CT of 08/19/2019. Electronically Signed   By: Van Clines M.D.   On: 03/06/2020 16:06   CT CHEST WO CONTRAST  Result Date: 03/06/2020 CLINICAL DATA:  Increasing falls. Swelling to the left arm and bruising over the right hip. Generalized weakness. EXAM: CT CHEST, ABDOMEN AND PELVIS WITHOUT CONTRAST TECHNIQUE: Multidetector CT imaging of the chest, abdomen and pelvis was performed following the standard protocol without IV contrast. COMPARISON:  November 10, 2019 FINDINGS: CT CHEST FINDINGS Cardiovascular: The heart is enlarged. There is no significant pericardial effusion. The intracardiac blood pool is hypodense relative to the adjacent myocardium consistent with anemia. There are atherosclerotic changes of the thoracic aorta without evidence for an aneurysm. There is a well-positioned tunneled dialysis catheter with tip terminating in the high right atrium. Mediastinum/Nodes: --No mediastinal or hilar lymphadenopathy. --No axillary lymphadenopathy. --No supraclavicular lymphadenopathy. --Normal thyroid gland. --The esophagus is unremarkable Lungs/Pleura: There are moderate-sized bilateral pleural effusions, right greater than left. There is interlobular septal thickening bilaterally. There is a ground-glass airspace opacity in the right upper lobe (axial series 5, image 59). There is no pneumothorax. There is some bronchial wall thickening and mucus plugging at the lung bases bilaterally. The trachea is unremarkable. Musculoskeletal: There are healing left-sided rib fractures. CT ABDOMEN PELVIS FINDINGS Hepatobiliary: There is a large cyst in the right hepatic lobe which is stable from prior study. Normal gallbladder.There is no biliary ductal dilation. Pancreas: Normal contours without ductal dilatation. No peripancreatic fluid collection. Spleen: Unremarkable. Adrenals/Urinary Tract: --Adrenal  glands: There is a stable benign lipid rich left adrenal adenoma. --Right kidney/ureter: The right kidney is atrophic. --Left kidney/ureter: The left kidney is atrophic. --Urinary bladder: Unremarkable. Stomach/Bowel: --Stomach/Duodenum: No hiatal hernia or other gastric abnormality. Normal duodenal course and caliber. --Small bowel: Unremarkable. --Colon: There is scattered colonic diverticula without CT evidence for diverticulitis. --Appendix: Normal. Vascular/Lymphatic: Atherosclerotic calcification is present within the non-aneurysmal abdominal aorta, without hemodynamically significant stenosis. --No retroperitoneal lymphadenopathy. --No mesenteric lymphadenopathy. --No pelvic or inguinal lymphadenopathy. Reproductive: Unremarkable Other: There is a small amount of free fluid in the patient's abdomen and pelvis. There is no free air. Musculoskeletal. There is a hematoma overlying the  proximal lateral left hip measuring approximately 5.8 x 3.7 cm. The patient is status post total hip arthroplasty on the left. The hardware appears intact where visualized. There is a chronic appearing fracture of the superior pubic ramus on the left. IMPRESSION: 1. Moderate-sized bilateral pleural effusions, right greater than left, with interlobular septal thickening, suggestive of interstitial pulmonary edema. 2. Ground-glass airspace opacity in the right upper lobe, concerning for pneumonia. 3. Status post left total hip arthroplasty. There is a 5.8 x 3.7 cm hematoma overlying the proximal lateral left hip. 4. Atrophic kidneys. 5. Colonic diverticulosis without CT evidence for diverticulitis. Aortic Atherosclerosis (ICD10-I70.0). Electronically Signed   By: Constance Holster M.D.   On: 03/06/2020 19:20   CT Cervical Spine Wo Contrast  Result Date: 03/06/2020 CLINICAL DATA:  Fall, altered mental status. EXAM: CT CERVICAL SPINE WITHOUT CONTRAST TECHNIQUE: Multidetector CT imaging of the cervical spine was performed without  intravenous contrast. Multiplanar CT image reconstructions were also generated. COMPARISON:  CT cervical spine 08/17/2017 FINDINGS: Alignment: Continued abnormal predental space, with 5 mm of space between the anterior arch of C1 in the odontoid, previously 4.5 mm back on 08/17/2017 by my measurements. The appearance suggests some degree of atlantoaxial instability, but clearly seems to be a chronic finding. There is chronic grade 1 mild posterior subluxation at C3-4. Skull base and vertebrae: Acquired interbody fusions likely through fused spurring at C3-4, C4-5, C5-6, and potentially C7-T1. Fused facet joints on the left at C4-5 and C5-6. No acute fracture is identified. Severe spondylosis noted. Soft tissues and spinal canal: There is likely at least mild central narrowing of the thecal sac at the C1 level due to the atlantoaxial subluxation and a small amount of pannus posterior to the odontoid. Right central line noted. Bilateral common carotid atherosclerotic calcification. Disc levels: Uncinate and facet spurring causes right foraminal impingement at C2-3, C3-4, C6-7, and potentially C7-T1; and left foraminal impingement at C3-4, C4-5, and C7-T1. Upper chest: Branch vessel atherosclerotic vascular calcification. Suspected right pleural effusion. Other: No supplemental non-categorized findings. IMPRESSION: 1. No acute cervical spine findings. 2. Continued abnormal predental space, with 5 mm of space between the anterior arch of C1 in the odontoid, previously 4.5 mm back on 08/17/2017 by my measurements. The appearance suggests chronic atlantoaxial instability/subluxation, but clearly seems to be a chronic finding. This is likely contributing to central narrowing of the thecal sac at the C1 level. 3. Severe spondylosis causing multilevel impingement. 4. Suspected right pleural effusion. 5. Atherosclerosis. Critical Value/emergent results were called by telephone at the time of interpretation on 03/06/2020 at 4:14  pm to provider Dr, Eugenio Hoes, who verbally acknowledged these results. In particular we discussed the cervical spine findings and also the intracranial findings including the presence of the subacute right subdural hematoma. Electronically Signed   By: Van Clines M.D.   On: 03/06/2020 16:14   DG Hips Bilat W or Wo Pelvis 5 Views  Result Date: 03/06/2020 CLINICAL DATA:  Recent falls with right hip pain. EXAM: DG HIP (WITH OR WITHOUT PELVIS) 5+V BILAT COMPARISON:  02/23/2015 FINDINGS: Left hip arthroplasty intact and unchanged. Mild degenerative changes of the right hip. Degenerative change of the spine and sacroiliac joints. No acute fracture or dislocation. IMPRESSION: No acute findings. Electronically Signed   By: Marin Olp M.D.   On: 03/06/2020 14:55    Dialysis Orders: Center: [facility] on MWF. Time: 4 hours, 180NRe, BFR 400, DFR Auto 1.5, EDW 66kg, TDC (AVG currently unusable) Sensipar  30mg  PO q HD Heparin 2000 unit bolus Calcitriol 1.5 mcg PO q HD  Midodrine 2.5mg  PO TID Auryxia 1 tab PO TID with meals  Assessment/Plan: 1.  Confusion/recent falls/subdural hematoma: Patient has dementia at baseline but more confused over the past several days and fell on 4/28. CT head showed subacute subdural hematoma. Neuro recommending conservative management. UA and culture ordered to investigate underlying source of encephalopathy. On augmentin for suspected aspiration pneumonia. Will renally adjust augmentin dose to 500mg /125mg  q 24 hours. 2.  ESRD:  Dialyzes on MWF schedule. No urgent indication for HD today. Will plan next HD 5/3 per regular schedule. No heparin with dialysis.  3.  Hypertension/volume: Euvolemic on exam, over EDW by bed weights. BP moderately elevated. History of orthostatic hypotension and does have recent falls. Outpatient med list reports midodrine as 2.5mg  TID however patient was not what dose he was actually taking. Will leave midodrine dose as is for  now and follow BP.  4.  Anemia: Hgb 7.8, decreased from 9.4 on 4/28. Recently finished course of venofer. Will start aranesp with HD.  5.  Metabolic bone disease: Calcium 9.9. Will continue sensipar and calcitriol. No phos reported yet. Continue home binder, will add phos level to AM labs.  6.  Nutrition:  Renal diet with fluid restriciton.   Anice Paganini, PA-C 03/07/2020, 10:07 AM   [facility] Pager: [phone]

## 2020-03-07 NOTE — Progress Notes (Signed)
Neurosurgery Service Progress Note  Subjective: No acute events overnight, subjectively seems brighter today   Objective: Vitals:   03/07/20 0008 03/07/20 0524 03/07/20 0754 03/07/20 0814  BP: (!) 145/75 (!) 150/76  (!) 148/68  Pulse: 85 86  88  Resp: 18 18  18   Temp: 97.8 F (36.6 C) 97.6 F (36.4 C)  97.8 F (36.6 C)  TempSrc: Oral Oral  Oral  SpO2: 95% 93% 95% (!) 88%  Weight: 77.1 kg     Height: 5\' 9"  (1.753 m)      Temp (24hrs), Avg:98.3 F (36.8 C), Min:97.6 F (36.4 C), Max:98.9 F (37.2 C)  CBC Latest Ref Rng & Units 03/07/2020 03/06/2020 01/08/2020  WBC 4.0 - 10.5 K/uL 10.8(H) 11.1(H) 7.9  Hemoglobin 13.0 - 17.0 g/dL 7.8(L) 7.0(L) 11.6(L)  Hematocrit 39.0 - 52.0 % 24.5(L) 22.6(L) 35.8(L)  Platelets 150 - 400 K/uL 206 232 205.0   BMP Latest Ref Rng & Units 03/07/2020 03/06/2020 12/04/2019  Glucose 70 - 99 mg/dL 101(H) 115(H) 90  BUN 8 - 23 mg/dL 23 17 20   Creatinine 0.61 - 1.24 mg/dL 5.77(H) 4.98(H) 6.81(HH)  Sodium 135 - 145 mmol/L 138 137 137  Potassium 3.5 - 5.1 mmol/L 3.7 3.7 5.0  Chloride 98 - 111 mmol/L 95(L) 90(L) 92(L)  CO2 22 - 32 mmol/L 29 30 33(H)  Calcium 8.9 - 10.3 mg/dL 9.3 9.5 9.5    Intake/Output Summary (Last 24 hours) at 03/07/2020 1113 Last data filed at 03/06/2020 1857 Gross per 24 hour  Intake 108.58 ml  Output --  Net 108.58 ml    Current Facility-Administered Medications:  .  amoxicillin-clavulanate (AUGMENTIN) 500-125 MG per tablet 500 mg, 1 tablet, Oral, Once, Rolla Flatten, [facility] .  [START ON 03/08/2020] amoxicillin-clavulanate (AUGMENTIN) 500-125 MG per tablet 500 mg, 500 mg, Oral, QHS, Rolla Flatten, [facility] .  [START ON 03/08/2020] calcitRIOL (ROCALTROL) capsule 1.5 mcg, 1.5 mcg, Oral, Q M,W,F-HD, Collins, [employer], PA-C .  Chlorhexidine Gluconate Cloth 2 % PADS 6 each, 6 each, Topical, Q0600, Collins, Samantha G, PA-C .  [START ON 03/08/2020] cinacalcet (SENSIPAR) tablet 30 mg, 30 mg, Oral, Q M,W,F-HD, Collins, Samantha G, PA-C .   citalopram (CELEXA) tablet 10 mg, 10 mg, Oral, Daily, Amin, Ankit Chirag, MD, 10 mg at 03/07/20 0907 .  ferric citrate (AURYXIA) tablet 420 mg, 420 mg, Oral, TID WC, Amin, Ankit Chirag, MD, 420 mg at 03/07/20 0908 .  HYDROcodone-acetaminophen (NORCO/VICODIN) 5-325 MG per tablet 1-2 tablet, 1-2 tablet, Oral, Q6H PRN, Amin, Ankit Chirag, MD .  insulin aspart (novoLOG) injection 0-5 Units, 0-5 Units, Subcutaneous, QHS, Amin, Ankit Chirag, MD .  insulin aspart (novoLOG) injection 0-6 Units, 0-6 Units, Subcutaneous, TID WC, Amin, Ankit Chirag, MD .  insulin aspart (novoLOG) injection 2 Units, 2 Units, Subcutaneous, TID WC, Amin, Ankit Chirag, MD .  ipratropium-albuterol (DUONEB) 0.5-2.5 (3) MG/3ML nebulizer solution 3 mL, 3 mL, Nebulization, Q4H PRN, Amin, Ankit Chirag, MD .  midodrine (PROAMATINE) tablet 10 mg, 10 mg, Oral, TID WC, Amin, Ankit Chirag, MD, 10 mg at 03/07/20 0906 .  multivitamin with minerals tablet 1 tablet, 1 tablet, Oral, Daily, Amin, Ankit Chirag, MD, 1 tablet at 03/07/20 0907 .  ondansetron (ZOFRAN) tablet 4 mg, 4 mg, Oral, Q6H PRN **OR** ondansetron (ZOFRAN) injection 4 mg, 4 mg, Intravenous, Q6H PRN, Amin, Ankit Chirag, MD .  polyethylene glycol (MIRALAX / GLYCOLAX) packet 17 g, 17 g, Oral, Daily PRN, Amin, Ankit Chirag, MD .  rosuvastatin (CRESTOR) tablet 20 mg, 20  mg, Oral, QHS, Amin, Ankit Chirag, MD, 20 mg at 03/06/20 2243 .  senna-docusate (Senokot-S) tablet 2 tablet, 2 tablet, Oral, QHS PRN, Amin, Jeanella Flattery, MD   Physical Exam: AOx3 and interactive, strength 5/5 x4, SILTx4  Assessment & Plan: 86 y.o. man s/p multiple falls with SDH, stable exam.  -no change in neurosurgical plan of care, okay for DVT prophylaxis starting tomorrow from my standpoint  Judith Part  03/07/20 11:13 AM

## 2020-03-07 NOTE — Progress Notes (Signed)
Hypoglycemic Event  CBG: 51 then treated with grape juice 4 oz and rechecked and was 53 then treated again with grape juice 8 oz then recheck was 59 then treated with 8 oz of ensure and juice and recheck was  52.  I then paged the doctor, as pt is able to and willing to take po per protocol but blood sugars are remaining in the 50s.  Then the doctor gave orders but after treating the BS of 52 with 8 oz of juice, BS did go up to 84.  Dr who was notified is Dr Reesa Chew.   Asencion Partridge

## 2020-03-07 NOTE — Evaluation (Signed)
Physical Therapy Evaluation Patient Details Name: [name] MRN: [id number] DOB: November 27, 1928 Today's Date: 03/07/2020   History of Present Illness  86 y.o. male with medical history significant of dementia, depression, left hip replacement, rt rotator cuff repair, ESRD Monday Wednesday Friday, gout, HLD, orthostatic hypotension brought to the hospital for evaluation of confusion and generalized weakness.  According to his son patient has become progressively confused and weak for the past 3-4 weeks. In the ER CT head showed subacute rt frontotemporal subdural hematoma, was seen by neurosurgery wtih no further intervention planned. +Lt hip hematoma; xray of hip and lt elbow negative  Clinical Impression   Pt admitted with above diagnosis. Patient unable to provide a reliable prior functional status or home set-up due to dementia. He was able to get OOB with moderate assist and then walk in room with min assist and rolling walker. RW seemed very familiar to patient. Will need to confirm pt's prior status with family and if they can provide 24/7 assist anticipate pt can discharge home. Pt currently with functional limitations due to the deficits listed below (see PT Problem List). Pt will benefit from skilled PT to increase their independence and safety with mobility to allow discharge to the venue listed below.       Follow Up Recommendations Home health PT;Supervision/Assistance - 24 hour(if family can provide 24/7 & pt able to enter/exit for HD)    Equipment Recommendations  Other (comment)(TBD when family can confirm)    Recommendations for Other Services OT consult     Precautions / Restrictions Precautions Precautions: Fall      Mobility  Bed Mobility Overal bed mobility: Needs Assistance Bed Mobility: Supine to Sit     Supine to sit: Mod assist     General bed mobility comments: once in sitting, pt able to scoot out to EOB with minguard assist  Transfers Overall transfer  level: Needs assistance Equipment used: Rolling walker (2 wheeled) Transfers: Sit to/from Stand Sit to Stand: Min assist         General transfer comment: from EOB x 2; less posterior lean 2nd attempt  Ambulation/Gait Ambulation/Gait assistance: Min assist Gait Distance (Feet): 12 Feet Assistive device: Rolling walker (2 wheeled) Gait Pattern/deviations: Step-through pattern;Decreased stride length;Decreased weight shift to left     General Gait Details: slight trendelenburg in WB on LLE resulting in short Rt step length  Stairs            Wheelchair Mobility    Modified Rankin (Stroke Patients Only) Modified Rankin (Stroke Patients Only) Pre-Morbid Rankin Score: (unknown; pt with dementia) Modified Rankin: Moderately severe disability     Balance Overall balance assessment: Needs assistance Sitting-balance support: No upper extremity supported;Feet supported Sitting balance-Leahy Scale: Fair     Standing balance support: Bilateral upper extremity supported Standing balance-Leahy Scale: Poor Standing balance comment: posterior lean, but progressed to minguard assist                             Pertinent Vitals/Pain Pain Assessment: Faces Faces Pain Scale: Hurts a little bit Pain Location: left elbow Pain Descriptors / Indicators: Aching Pain Intervention(s): Limited activity within patient's tolerance;Monitored during session    Home Living Family/patient expects to be discharged to:: Private residence Living Arrangements: Spouse/significant other               Additional Comments: no family present; pt with confusion    Prior Function  Comments: reports he no longer drives; wife or son drives him to dialysis      Hand Dominance   Dominant Hand: Right    Extremity/Trunk Assessment   Upper Extremity Assessment Upper Extremity Assessment: Defer to OT evaluation    Lower Extremity Assessment Lower Extremity  Assessment: Generalized weakness;Difficult to assess due to impaired cognition(LLE appears weaker during gait)    Cervical / Trunk Assessment Cervical / Trunk Assessment: Kyphotic  Communication   Communication: No difficulties  Cognition Arousal/Alertness: Awake/alert Behavior During Therapy: WFL for tasks assessed/performed Overall Cognitive Status: No family/caregiver present to determine baseline cognitive functioning                                 General Comments: h/o dementia; repeats statements/questions       General Comments      Exercises     Assessment/Plan    PT Assessment Patient needs continued PT services  PT Problem List Decreased strength;Decreased activity tolerance;Decreased balance;Decreased mobility;Decreased cognition;Decreased knowledge of use of DME;Decreased safety awareness;Decreased knowledge of precautions       PT Treatment Interventions DME instruction;Gait training;Stair training;Functional mobility training;Therapeutic activities;Therapeutic exercise;Balance training;Neuromuscular re-education;Cognitive remediation;Patient/family education    PT Goals (Current goals can be found in the Care Plan section)  Acute Rehab PT Goals Patient Stated Goal: wants to go home PT Goal Formulation: Patient unable to participate in goal setting Time For Goal Achievement: 03/21/20 Potential to Achieve Goals: Good    Frequency Min 3X/week   Barriers to discharge        Co-evaluation               AM-PAC PT "6 Clicks" Mobility  Outcome Measure Help needed turning from your back to your side while in a flat bed without using bedrails?: A Little Help needed moving from lying on your back to sitting on the side of a flat bed without using bedrails?: A Little Help needed moving to and from a bed to a chair (including a wheelchair)?: A Little Help needed standing up from a chair using your arms (e.g., wheelchair or bedside chair)?: A  Little Help needed to walk in hospital room?: A Little Help needed climbing 3-5 steps with a railing? : A Lot 6 Click Score: 17    End of Session Equipment Utilized During Treatment: Gait belt Activity Tolerance: Patient tolerated treatment well Patient left: in chair;with call bell/phone within reach;with chair alarm set Nurse Communication: Mobility status PT Visit Diagnosis: Other abnormalities of gait and mobility (R26.89);Muscle weakness (generalized) (M62.81);Other symptoms and signs involving the nervous system (R29.898)    Time: 8756-4332 PT Time Calculation (min) (ACUTE ONLY): 24 min   Charges:   PT Evaluation $PT Eval Low Complexity: 1 Low PT Treatments $Therapeutic Activity: 8-22 mins         Arby Barrette, PT Pager [phone]   Rexanne Mano 03/07/2020, 3:40 PM

## 2020-03-07 NOTE — Progress Notes (Signed)
PHARMACY NOTE:  ANTIMICROBIAL RENAL DOSAGE ADJUSTMENT  Current antimicrobial regimen includes a mismatch between antimicrobial dosage and estimated renal function.  As per policy approved by the Pharmacy & Therapeutics and Medical Executive Committees, the antimicrobial dosage will be adjusted accordingly.  Current antimicrobial dosage:  Augmentin 875 mg bid  Indication: Aspiration PNA  Renal Function:  Estimated Creatinine Clearance: 8.8 mL/min (A) (by C-G formula based on SCr of 5.77 mg/dL (H)). [x]      On intermittent HD, scheduled: []      On CRRT    Antimicrobial dosage has been changed to:  Augmentin 500 mg x 1 now then daily at bedtime  Additional comments:   Thank you for allowing pharmacy to be a part of this patient's care.  Alycia Rossetti, PharmD, BCPS Clinical Pharmacist Clinical phone for 03/07/2020: [phone] 03/07/2020 10:31 AM   **Pharmacist phone directory can now be found on [email or web address] ([email or web address]).  Listed under [place].

## 2020-03-08 LAB — CBC
HCT: 22.3 % — ABNORMAL LOW (ref 39.0–52.0)
Hemoglobin: 7.3 g/dL — ABNORMAL LOW (ref 13.0–17.0)
MCH: 31.1 pg (ref 26.0–34.0)
MCHC: 32.7 g/dL (ref 30.0–36.0)
MCV: 94.9 fL (ref 80.0–100.0)
Platelets: 219 10*3/uL (ref 150–400)
RBC: 2.35 MIL/uL — ABNORMAL LOW (ref 4.22–5.81)
RDW: 16.8 % — ABNORMAL HIGH (ref 11.5–15.5)
WBC: 13.4 10*3/uL — ABNORMAL HIGH (ref 4.0–10.5)
nRBC: 0.1 % (ref 0.0–0.2)

## 2020-03-08 LAB — COMPREHENSIVE METABOLIC PANEL
ALT: 12 U/L (ref 0–44)
AST: 25 U/L (ref 15–41)
Albumin: 3.2 g/dL — ABNORMAL LOW (ref 3.5–5.0)
Alkaline Phosphatase: 56 U/L (ref 38–126)
Anion gap: 12 (ref 5–15)
BUN: 33 mg/dL — ABNORMAL HIGH (ref 8–23)
CO2: 31 mmol/L (ref 22–32)
Calcium: 9.4 mg/dL (ref 8.9–10.3)
Chloride: 93 mmol/L — ABNORMAL LOW (ref 98–111)
Creatinine, Ser: 7.37 mg/dL — ABNORMAL HIGH (ref 0.61–1.24)
GFR calc Af Amer: 7 mL/min — ABNORMAL LOW (ref >60)
GFR calc non Af Amer: 6 mL/min — ABNORMAL LOW (ref >60)
Glucose, Bld: 96 mg/dL (ref 70–99)
Potassium: 3.9 mmol/L (ref 3.5–5.1)
Sodium: 136 mmol/L (ref 135–145)
Total Bilirubin: 1.4 mg/dL — ABNORMAL HIGH (ref 0.3–1.2)
Total Protein: 5.8 g/dL — ABNORMAL LOW (ref 6.5–8.1)

## 2020-03-08 LAB — GLUCOSE, CAPILLARY
Glucose-Capillary: 74 mg/dL (ref 70–99)
Glucose-Capillary: 25 mg/dL — CL (ref 70–99)
Glucose-Capillary: 41 mg/dL — CL (ref 70–99)
Glucose-Capillary: 61 mg/dL — ABNORMAL LOW (ref 70–99)
Glucose-Capillary: 41 mg/dL — CL (ref 70–99)
Glucose-Capillary: <10 mg/dL — CL (ref 70–99)
Glucose-Capillary: 90 mg/dL (ref 70–99)
Glucose-Capillary: 108 mg/dL — ABNORMAL HIGH (ref 70–99)
Glucose-Capillary: 83 mg/dL (ref 70–99)
Glucose-Capillary: 95 mg/dL (ref 70–99)

## 2020-03-08 LAB — MAGNESIUM: Magnesium: 2.1 mg/dL (ref 1.7–2.4)

## 2020-03-08 LAB — URINE CULTURE: Culture: NO GROWTH

## 2020-03-08 LAB — PREPARE RBC (CROSSMATCH)

## 2020-03-08 LAB — GLUCOSE, RANDOM: Glucose, Bld: 134 mg/dL — ABNORMAL HIGH (ref 70–99)

## 2020-03-08 LAB — PHOSPHORUS: Phosphorus: 3.2 mg/dL (ref 2.5–4.6)

## 2020-03-08 MED ORDER — HEPARIN SODIUM (PORCINE) 1000 UNIT/ML IJ SOLN
INTRAMUSCULAR | Status: AC
  Administered 2020-03-08: 1900 [IU]
  Filled 2020-03-08: qty 2

## 2020-03-08 MED ORDER — RESOURCE THICKENUP CLEAR PO POWD
ORAL | Status: DC | PRN
  Filled 2020-03-08: qty 125

## 2020-03-08 MED ORDER — MIDODRINE HCL 5 MG PO TABS
ORAL_TABLET | ORAL | Status: AC
  Filled 2020-03-08: qty 2

## 2020-03-08 MED ORDER — SODIUM CHLORIDE 0.9% IV SOLUTION: Freq: Once | INTRAVENOUS | Status: DC

## 2020-03-08 MED ORDER — CINACALCET HCL 30 MG PO TABS
ORAL_TABLET | ORAL | Status: AC
  Administered 2020-03-08: 30 mg via ORAL
  Filled 2020-03-08: qty 1

## 2020-03-08 MED ORDER — DEXTROSE 50 % IV SOLN
INTRAVENOUS | Status: AC
  Administered 2020-03-08: 50 mL
  Filled 2020-03-08: qty 50

## 2020-03-08 MED ORDER — CALCITRIOL 0.5 MCG PO CAPS
ORAL_CAPSULE | ORAL | Status: AC
  Administered 2020-03-08: 1.5 ug via ORAL
  Filled 2020-03-08: qty 3

## 2020-03-08 MED ORDER — STARCH (THICKENING) PO POWD
ORAL | Status: DC | PRN
  Filled 2020-03-08: qty 227

## 2020-03-08 MED ORDER — HEPARIN SODIUM (PORCINE) 1000 UNIT/ML IJ SOLN
INTRAMUSCULAR | Status: AC
  Administered 2020-03-08: 3800 [IU]
  Filled 2020-03-08: qty 4

## 2020-03-08 MED ORDER — HEPARIN SODIUM (PORCINE) 1000 UNIT/ML IJ SOLN
INTRAMUSCULAR | Status: AC
  Filled 2020-03-08: qty 2

## 2020-03-08 MED ORDER — GLUCOSE 40 % PO GEL
2.0000 | ORAL | Status: AC
  Administered 2020-03-08: 75 g via ORAL

## 2020-03-08 MED ORDER — DARBEPOETIN ALFA 60 MCG/0.3ML IJ SOSY: 60.0000 ug | PREFILLED_SYRINGE | INTRAMUSCULAR | Status: DC

## 2020-03-08 MED ORDER — GLUCOSE 40 % PO GEL
ORAL | Status: AC
  Filled 2020-03-08: qty 1

## 2020-03-08 MED ORDER — DARBEPOETIN ALFA 60 MCG/0.3ML IJ SOSY
PREFILLED_SYRINGE | INTRAMUSCULAR | Status: AC
  Administered 2020-03-08: 60 ug via INTRAVENOUS
  Filled 2020-03-08: qty 0.3

## 2020-03-08 NOTE — Significant Event (Signed)
Hypoglycemic Event  CBG:61 @1142   Treatment: 4 oz juice/soda  Symptoms: None  Follow-up CBG: Time:@1204  CBG Result:41  Possible Reasons for Event: Inadequate meal intake  Comments/MD notified:Dr.Schertz (nephrology)  Pt requiring multiple interventions, pt blood glucose ranging from 25 to <10. Dr.Schertz aware, rapid response nurse contacted, pt asymptomatic, alert and verbally responsive. Stat glucose sent and pending. Last CBG at 1335-90. Pt's primary nurse made aware.   [name]

## 2020-03-08 NOTE — Evaluation (Signed)
Clinical/Bedside Swallow Evaluation Patient Details  Name: [name] MRN: [id number] Date of Birth: 09/27/1929  Today's Date: 03/08/2020 Time: SLP Start Time (ACUTE ONLY): 6295 SLP Stop Time (ACUTE ONLY): 1509 SLP Time Calculation (min) (ACUTE ONLY): 27 min  Past Medical History:  Past Medical History:  Diagnosis Date  . ANEMIA-IRON DEFICIENCY 03/09/2008  . Arthritis    cervical spine.   . Complex renal cyst 06/19/2011  . Depression 03/26/2014  . DIVERTICULOSIS, COLON 03/09/2008  . ESRD (end stage renal disease) ([place]) 03/09/2008  . Flu 12/08/2016  . GOUT 03/09/2008  . Hemorrhoids 08/2009   internal.   . History of blood transfusion   . HYPERLIPIDEMIA 03/09/2008  . Hyperparathyroidism ([place])   . HYPERTENSION 03/09/2008  . Prostate cancer [facility]) 2002   Completed external beam radiation 2003.per HPI  . PROSTATE CANCER, HX OF 03/09/2008  . Radiation cystitis 2010.  Marland Kitchen Stroke [facility])    Past Surgical History:  Past Surgical History:  Procedure Laterality Date  . AV FISTULA PLACEMENT Left 01/24/2013   Procedure: INSERTION OF ARTERIOVENOUS (AV) GORE-TEX GRAFT ARM;  Surgeon: Rosetta Posner, MD;  Location: [place];  Service: Vascular;  Laterality: Left;  . COLONOSCOPY N/A 06/17/2015   Procedure: COLONOSCOPY;  Surgeon: Gatha Mayer, MD;  Location: [place];  Service: Endoscopy;  Laterality: N/A;  . ESOPHAGOGASTRODUODENOSCOPY N/A 06/17/2015   Procedure: ESOPHAGOGASTRODUODENOSCOPY (EGD);  Surgeon: Gatha Mayer, MD;  [facility] ENDOSCOPY;  Service: Endoscopy;  Laterality: N/A;  . FLEXIBLE SIGMOIDOSCOPY N/A 06/15/2015   Procedure: FLEXIBLE SIGMOIDOSCOPY;  Surgeon: Gatha Mayer, MD;  Location: [place];  Service: Endoscopy;  Laterality: N/A;  . GIVENS CAPSULE STUDY N/A 07/06/2015   Procedure: GIVENS CAPSULE STUDY;  Surgeon: Milus Banister, MD;  Location: [place];  Service: Endoscopy;  Laterality: N/A;  . JOINT REPLACEMENT     HIP  . rotator cuff repair right  4/08  . s/p left hip  replacement  2007   Dr. Percell Miller ortho  . TONSILLECTOMY     HPI:  Pt is an 86 yo male presenting with AMS and weakness with recent falls. CT Head showed subacte R frontotemporal SDH. CT Chest concerning for RUL PNA. PMH nicludes: dementia, CVA, ESRD, depression, L hip replacement, gout, HLD   Assessment / Plan / Recommendation Clinical Impression  Pt's mastication is currently impacted by missing lower dentures. Per son, these were last seen in the ED (RN made aware, investigating).  Mild oral residue was noted with soft solids that cleared readily with liquid wash. Inadequate mastication combined with cognitive status could pose a risk for aspiration, but there are no overt s/s of aspiration across intake today. Discussed options for softer diet with son, but he has already started selecting softer food options from the menu for his meals. Educated him about the importance of checking for oral residue. Given the above as well as CT findings, will f/u briefly for safety.   SLP Visit Diagnosis: Dysphagia, oral phase (R13.11)    Aspiration Risk  Mild aspiration risk    Diet Recommendation Regular;Thin liquid   Liquid Administration via: Cup;Straw Medication Administration: Whole meds with liquid Supervision: Staff to assist with self feeding Compensations: Slow rate;Small sips/bites;Minimize environmental distractions;Follow solids with liquid Postural Changes: Seated upright at 90 degrees    Other  Recommendations Oral Care Recommendations: Oral care BID   Follow up Recommendations 24 hour supervision/assistance      Frequency and Duration min 1 x/week  1 week  Prognosis Prognosis for Safe Diet Advancement: Good Barriers to Reach Goals: Cognitive deficits;Other (Comment)(availability of dentures)      Swallow Study   General HPI: Pt is an 86 yo male presenting with AMS and weakness with recent falls. CT Head showed subacte R frontotemporal SDH. CT Chest concerning for RUL  PNA. PMH nicludes: dementia, CVA, ESRD, depression, L hip replacement, gout, HLD Type of Study: Bedside Swallow Evaluation Previous Swallow Assessment: none in chart Diet Prior to this Study: Regular;Thin liquids Temperature Spikes Noted: No Respiratory Status: Room air History of Recent Intubation: No Behavior/Cognition: Alert;Cooperative;Pleasant mood Oral Cavity Assessment: Other (comment)(black coating on tongue, ? from Iron supplement?) Oral Care Completed by SLP: No Oral Cavity - Dentition: Dentures, top;Dentures, not available(lower dentition missing; RN made aware) Vision: Functional for self-feeding Self-Feeding Abilities: Needs assist Patient Positioning: Upright in bed Baseline Vocal Quality: Normal Volitional Swallow: Unable to elicit    Oral/Motor/Sensory Function Overall Oral Motor/Sensory Function: Within functional limits   Ice Chips Ice chips: Within functional limits Presentation: Spoon   Thin Liquid Thin Liquid: Within functional limits Presentation: Self Fed;Cup;Straw    Nectar Thick Nectar Thick Liquid: Not tested   Honey Thick Honey Thick Liquid: Not tested   Puree Puree: Within functional limits Presentation: Spoon   Solid     Solid: Impaired Oral Phase Impairments: Impaired mastication Oral Phase Functional Implications: Oral residue      Osie Bond., M.A. [place] Acute Rehabilitation Services Pager [phone] Office [phone]  03/08/2020,3:18 PM

## 2020-03-08 NOTE — Progress Notes (Signed)
OT Cancellation Note  Patient Details Name: [name] MRN: [id number] DOB: [birth year]   Cancelled Treatment:    Reason Eval/Treat Not Completed: Other (comment). Pt in middle of eating lunch.  Golden Circle, OTR/L Acute Rehab Services Pager [phone] Office [phone]     Almon Register 03/08/2020, 4:03 PM

## 2020-03-08 NOTE — TOC Initial Note (Signed)
Transition of Care [facility]) - Initial/Assessment Note    Patient Details  Name: [name] MRN: [id number] Date of Birth: 06/22/1929  Transition of Care [facility]) CM/SW Contact:    Pollie Friar, RN Phone Number: 03/08/2020, 4:15 PM  Clinical Narrative:   Denies issues with home medications or transportation.               CM met with the patient and his son. Son did the talking and pt was quiet. CM provided choice for [facility] agencies and son selected [facility]. Butch Penny with [facility] accepted the referral.  CM following for further d/c needs.   Expected Discharge Plan: [place] Barriers to Discharge: Continued Medical Work up   Patient Goals and CMS Choice   CMS Medicare.gov Compare Post Acute Care list provided to:: Patient Represenative (must comment) Choice offered to / list presented to : Adult Children  Expected Discharge Plan and Services Expected Discharge Plan: [place]   Discharge Planning Services: CM Consult Post Acute Care Choice: [place] arrangements for the past 2 months: [place]: [place] ([place]) Date [place]: 03/08/20   Representative spoke with at [place]: Butch Penny  Prior Living Arrangements/Services Living arrangements for the past 2 months: [place] Lives with:: Adult Children Patient language and need for interpreter reviewed:: Yes Do you feel safe going back to the place where you live?: Yes      Need for Family Participation in Patient Care: Yes (Comment) Care giver support system in place?: Yes (comment)   Criminal Activity/Legal Involvement Pertinent to Current Situation/Hospitalization: No - Comment as needed  Activities of Daily Living   ADL Screening (condition at time of admission) Patient's cognitive ability adequate to safely complete daily activities?: Yes Is the patient deaf or have difficulty  hearing?: Yes Does the patient have difficulty seeing, even when wearing glasses/contacts?: No Does the patient have difficulty concentrating, remembering, or making decisions?: Yes Patient able to express need for assistance with ADLs?: Yes Does the patient have difficulty dressing or bathing?: No Independently performs ADLs?: Yes (appropriate for developmental age) Does the patient have difficulty walking or climbing stairs?: Yes Weakness of Legs: Both Weakness of Arms/Hands: None  Permission Sought/Granted                  Emotional Assessment Appearance:: Appears stated age         Psych Involvement: No (comment)  Admission diagnosis:  Subdural hematoma ([place]) [S06.5X9A] Multiple falls [R29.6] Fatigue, unspecified type [R53.83] Patient Active Problem List   Diagnosis Date Noted  . Falls 03/06/2020  . General weakness 03/06/2020  . Subdural hematoma ([place]) 03/06/2020  . Vitamin D deficiency 01/10/2020  . Benign neoplasm of scrotum 01/08/2020  . Bilateral hearing loss due to cerumen impaction 03/17/2019  . Irregular heart rhythm 10/10/2018  . Left flank pain 01/10/2018  . Influenza A 12/08/2016  . Elevated troponin 12/08/2016  . Anemia in chronic kidney disease (CKD) 10/04/2016  . Orthostatic hypotension 09/07/2016  . Mass of skin of finger of left hand 10/11/2015  . ESRD on dialysis ([place]) 09/09/2015  . Cerebrovascular accident (CVA) due to thrombosis of right middle cerebral artery ([place]) 09/09/2015  . History of GI bleed   . HLD (hyperlipidemia)   .  Benign neoplasm of descending colon   . Gastritis   . Syncope 06/15/2015  . Diverticulosis of colon with hemorrhage   . Low back pain 03/26/2014  . Depression 03/26/2014  . Complex renal cyst 06/19/2011  . Preventative health care 06/17/2011  . SHOULDER PAIN, LEFT 06/03/2010  . Gout 03/09/2008  . Iron deficiency anemia 03/09/2008  . Essential hypertension 03/09/2008  . FATIGUE 03/09/2008  . PROSTATE CANCER, HX  OF 03/09/2008   PCP:  [name], MD Pharmacy:   [id number] [place] [id number] [id number]: [phone]: [phone]    Social Determinants of Health (SDOH) Interventions    Readmission Risk Interventions No flowsheet data found.

## 2020-03-08 NOTE — Progress Notes (Signed)
OT Cancellation Note  Patient Details Name: [name] MRN: [id number] DOB: [birth year]   Cancelled Treatment:    Reason Eval/Treat Not Completed: Patient at procedure or test/ unavailable Pt currently in dialysis.  Golden Circle, OTR/L Acute Rehab Services Pager [phone] Office [phone]     Al[place] Register 03/08/2020, 7:40 AM

## 2020-03-08 NOTE — Evaluation (Signed)
Occupational Therapy Evaluation Patient Details Name: [name] MRN: [id number] DOB: 20-Jan-1929 Today's Date: 03/08/2020    History of Present Illness 86 y.o. male with medical history significant of dementia, depression, left hip replacement, rt rotator cuff repair, ESRD Monday Wednesday Friday, gout, HLD, orthostatic hypotension brought to the hospital for evaluation of confusion and generalized weakness.  According to his son patient has become progressively confused and weak for the past 3-4 weeks. In the ER CT head showed subacute rt frontotemporal subdural hematoma, was seen by neurosurgery wtih no further intervention planned. +Lt hip hematoma; xray of hip and lt elbow negative   Clinical Impression   This 86 yo male admitted with above presents to acute OT being seen this PM after dialysis this morning and needing more A (Mod A for all bed mobility, sit<>stand and ambulation; total A for toileting with Mod A for sit<>stand and maintain standing, Max A for self feeding post set up). He will benefit from acute OT with follow up at [facility] v. SNF depending on how much A family can provide.    Follow Up Recommendations  Home health OT(v. SNF (depends on how much A family can provide))    Equipment Recommendations  3 in 1 bedside commode;Hospital bed       Precautions / Restrictions Precautions Precautions: Fall Restrictions Weight Bearing Restrictions: No      Mobility Bed Mobility Overal bed mobility: Needs Assistance Bed Mobility: Supine to Sit     Supine to sit: Mod assist;HOB elevated     General bed mobility comments: once in sitting, pt could not scoot himself forward needed Mod A as well for this  Transfers Overall transfer level: Needs assistance Equipment used: Rolling walker (2 wheeled) Transfers: Sit to/from Stand Sit to Stand: Mod assist         General transfer comment: posterior lean on all 3 sit<> trials    Balance Overall balance assessment: Needs  assistance Sitting-balance support: Feet supported;No upper extremity supported Sitting balance-Leahy Scale: Fair     Standing balance support: Bilateral upper extremity supported Standing balance-Leahy Scale: Poor Standing balance comment: posterior lean, said at a Mod A level with short shuffling steps with RW and A to [employer] RW                           ADL either performed or assessed with clinical judgement   ADL Overall ADL's : Needs assistance/impaired Eating/Feeding: Maximal assistance;Sitting Eating/Feeding Details (indicate cue type and reason): in recliner Grooming: Maximal assistance;Sitting Grooming Details (indicate cue type and reason): in recliner Upper Body Bathing: Maximal assistance;Sitting Upper Body Bathing Details (indicate cue type and reason): in recliner Lower Body Bathing: Total assistance Lower Body Bathing Details (indicate cue type and reason): Mod A sit<>stand with posterior lean Upper Body Dressing : Total assistance;Sitting Upper Body Dressing Details (indicate cue type and reason): in recliner Lower Body Dressing: Total assistance Lower Body Dressing Details (indicate cue type and reason): Mod A sit<>stand with posterior lean Toilet Transfer: Moderate assistance;Ambulation;RW Toilet Transfer Details (indicate cue type and reason): bed around to recliner on other side Toileting- Clothing Manipulation and Hygiene: Total assistance Toileting - Clothing Manipulation Details (indicate cue type and reason): Mod A sit<>stand with posterior lean             Vision Baseline Vision/History: Wears glasses Wears Glasses: At all times Patient Visual Report: No change from baseline  Pertinent Vitals/Pain Pain Assessment: No/denies pain     Hand Dominance Right   Extremity/Trunk Assessment Upper Extremity Assessment Upper Extremity Assessment: Generalized weakness           Communication Communication Communication:  HOH   Cognition Arousal/Alertness: Awake/alert Behavior During Therapy: Flat affect Overall Cognitive Status: No family/caregiver present to determine baseline cognitive functioning                                 General Comments: h/o dementia; repeats statements/questions, pleasant and thanking me alot              Home Living Family/patient expects to be discharged to:: Private residence Living Arrangements: Spouse/significant other                               Additional Comments: no family present; pt with confusion               OT Problem List: Decreased strength;Impaired balance (sitting and/or standing);Decreased cognition;Decreased safety awareness      OT Treatment/Interventions: Self-care/ADL training;DME and/or AE instruction;Patient/family education;Balance training;Therapeutic activities    OT Goals(Current goals can be found in the care plan section) Acute Rehab OT Goals Patient Stated Goal: to go home with son (son not in room, but pt reported "my son is coming to get me today to take me home" OT Goal Formulation: Patient unable to participate in goal setting Time For Goal Achievement: 03/22/20 Potential to Achieve Goals: Fair  OT Frequency: Min 2X/week              AM-PAC OT "6 Clicks" Daily Activity     Outcome Measure Help from another person eating meals?: A Lot Help from another person taking care of personal grooming?: A Lot Help from another person toileting, which includes using toliet, bedpan, or urinal?: Total Help from another person bathing (including washing, rinsing, drying)?: A Lot Help from another person to put on and taking off regular upper body clothing?: A Lot Help from another person to put on and taking off regular lower body clothing?: Total 6 Click Score: 10   End of Session Equipment Utilized During Treatment: Gait belt;Rolling walker  Activity Tolerance: Patient tolerated treatment  well Patient left: in chair;with call bell/phone within reach(chair posey alarm belt applied that was in room)  OT Visit Diagnosis: Unsteadiness on feet (R26.81);Other abnormalities of gait and mobility (R26.89);Muscle weakness (generalized) (M62.81);Other symptoms and signs involving cognitive function;Cognitive communication deficit (R41.841) Symptoms and signs involving cognitive functions: (demenita)                Time: 4034-7425 OT Time Calculation (min): 33 min Charges:  OT General Charges $OT Visit: 1 Visit OT Evaluation $OT Eval Moderate Complexity: 1 Mod OT Treatments $Self Care/Home Management : 8-22 mins  [address], OTR/L Acute [employer] Pager [phone] Office [phone]     [name] 03/08/2020, 5:39 PM

## 2020-03-08 NOTE — Progress Notes (Signed)
PROGRESS NOTE    [name]  TFT:[id number] DOB: [birth year] DOA: 03/06/2020 PCP: [name], MD   Brief Narrative:   86 y.o. male with medical history significant of anemia of chronic disease, iron deficiency, depression, ESRD Monday Wednesday Friday, gout, HLD, orthostatic hypotension brought to the hospital for evaluation of confusion and generalized weakness.  Also his hemoglobin was noted to be 7.0, down from baseline of 10.0 with unclear etiology.  CT showed left hip hematoma.  Concerns of aspiration pneumonia along with urinary tract infection.  On Augmentin empirically.  Hospital course complicated by intermittent recurrent hypoglycemia.   Assessment & Plan:   Principal Problem:   Subdural hematoma (HCC) Active Problems:   Gout   Iron deficiency anemia   Essential hypertension   HLD (hyperlipidemia)   ESRD on dialysis (HCC)   Orthostatic hypotension   Falls   General weakness  Multiple falls with generalized weakness Right-sided frontal parietal subacute hematoma without midline shift or mass-effect -Continue neurochecks.  Seen by neurosurgery, no surgical indication.  Closely monitor. -PT/OT-home health -Blood cultures negative.  UA-UTI -Supportive care. -Holding aspirin -CT head-subacute subdural hematoma without midline shift.  CT cervical spine-chronic changes.  Chest x-ray-some pleural effusion bilaterally, x-ray of hip and left elbow-negative for any acute pathology.  Acute on chronic iron deficiency anemia, also microcytic Left hip Hematoma. 5.8x 3.7 cm -Unclear etiology, Hemoccult negative.  Suspect from hip hematoma -Hemoglobin 7.8 status post 1 PRBC transfusion. Will order 1 more Unit.  -Iron studies and B12 normal.  TSH normal.  Folate-pending -Status post transfusion-5/1.  1 more unit ordered today. -CT chest abdomen pelvis without contrast -right upper lobe lung opacity, small left hip hematoma. -Discussed with Dr Alvan Dame on 5/1, orthopedic regarding  hip hematoma.  Appears to be consistent due to falls.  Continue monitoring for now.  Acute metabolic encephalopathy; improved Urinary tract infection without hematuria -Exam is mostly nonfocal.  Currently alert awake oriented X 2.  Suspect underlying infection.  Ongoing work-up. -Culture pending, on Augmentin empirically  Recurrent hypoglycemia Encouraging oral intake.  Amp of D50 as necessary.  Right Upper Lobe opacity concerning for aspiration pneumonia versus pneumonitis Concerns for aspiration. Nebs Prn. S&S eval.  Check procalcitonin -Empiric Augmentin for 5 days, last day 5/5  ESRD hemodialysis Monday Wednesday Friday -Nephro following, HD today  Orthostatic hypotension -On midodrine  Hyperlipidemia -Statin  History of dementia with occasional behavioral disturbance -On Celexa.  Supportive care.  Gout -On allopurinol, currently on hold  Recurrent asymptomatic hypoglycemia -Discontinued premeal insulin.  Sensitive sliding scale only.   DVT prophylaxis: SCDs Code Status: Full  Family Communication: Spoke with son at bedside  Status is: Inpatient  Remains inpatient appropriate because:Persistent severe electrolyte disturbances.  Worried about ongoing blood loss.  Getting 1 unit PRBC transfusion.  Keep him in the hospital today to ensure hemoglobin remained stable in the meantime getting speech and swallow evaluation and dialysis today.   Dispo: The patient is from: Home              Anticipated d/c is to: Home              Anticipated d/c date is: 2 days              Patient currently is not medically stable to d/c.  Subjective: Recurrent hypoglycemia in HD. No other complaints.   Review of Systems Otherwise negative except as per HPI, including: General: Denies fever, chills, night sweats or unintended weight loss. Resp:  Denies cough, wheezing, shortness of breath. Cardiac: Denies chest pain, palpitations, orthopnea, paroxysmal nocturnal dyspnea. GI:  Denies abdominal pain, nausea, vomiting, diarrhea or constipation GU: Denies dysuria, frequency, hesitancy or incontinence MS: Denies muscle aches, joint pain or swelling Neuro: Denies headache, neurologic deficits (focal weakness, numbness, tingling), abnormal gait Psych: Denies anxiety, depression, SI/HI/AVH Skin: Denies new rashes or lesions ID: Denies sick contacts, exotic exposures, travel  Examination: Constitutional: Not in acute distress Respiratory: Clear to auscultation bilaterally Cardiovascular: Normal sinus rhythm, no rubs Abdomen: Nontender nondistended good bowel sounds Musculoskeletal: No edema noted Skin: No rashes seen Neurologic: CN 2-12 grossly intact.  And nonfocal Psychiatric: Normal judgment and insight. Alert and oriented x 3. Normal mood.    Objective: Vitals:   03/07/20 1600 03/07/20 1951 03/08/20 0006 03/08/20 0418  BP: (!) 181/93 131/63 (!) 143/68 134/63  Pulse: 91 98 86 92  Resp: 18 18 18 18   Temp: (!) 97.5 F (36.4 C) 97.7 F (36.5 C) 98 F (36.7 C) 97.6 F (36.4 C)  TempSrc: Oral Oral Oral Oral  SpO2: 95% 100% 94%   Weight:      Height:        Intake/Output Summary (Last 24 hours) at 03/08/2020 0723 Last data filed at 03/07/2020 1930 Gross per 24 hour  Intake 600 ml  Output --  Net 600 ml   Filed Weights   03/06/20 1159 03/07/20 0008  Weight: 68 kg 77.1 kg     Data Reviewed:   CBC: Recent Labs  Lab 03/06/20 1229 03/07/20 0358 03/08/20 0303  WBC 11.1* 10.8* 13.4*  HGB 7.0* 7.8* 7.3*  HCT 22.6* 24.5* 22.3*  MCV 100.9* 95.7 94.9  PLT 232 206 962   Basic Metabolic Panel: Recent Labs  Lab 03/06/20 1229 03/07/20 0358 03/08/20 0303  NA 137 138 136  K 3.7 3.7 3.9  CL 90* 95* 93*  CO2 30 29 31   GLUCOSE 115* 101* 96  BUN 17 23 33*  CREATININE 4.98* 5.77* 7.37*  CALCIUM 9.5 9.3 9.4  MG  --  2.1 2.1  PHOS  --   --  3.2   GFR: Estimated Creatinine Clearance: 6.9 mL/min (A) (by C-G formula based on SCr of 7.37 mg/dL  (H)). Liver Function Tests: Recent Labs  Lab 03/06/20 1437 03/07/20 0358 03/08/20 0303  AST 27 27 25   ALT 13 13 12   ALKPHOS 58 57 56  BILITOT 1.2 1.5* 1.4*  PROT 6.3* 6.0* 5.8*  ALBUMIN 3.4* 3.3* 3.2*   No results for input(s): LIPASE, AMYLASE in the last 168 hours. Recent Labs  Lab 03/06/20 1437  AMMONIA 18   Coagulation Profile: No results for input(s): INR, PROTIME in the last 168 hours. Cardiac Enzymes: Recent Labs  Lab 03/06/20 1745  CKTOTAL 181   BNP (last 3 results) No results for input(s): PROBNP in the last 8760 hours. HbA1C: Recent Labs    03/06/20 1745  HGBA1C 5.5   CBG: Recent Labs  Lab 03/07/20 1759 03/07/20 1813 03/07/20 1832 03/07/20 2117 03/08/20 0601  GLUCAP 59* 52* 84 107* 74   Lipid Profile: No results for input(s): CHOL, HDL, LDLCALC, TRIG, CHOLHDL, LDLDIRECT in the last 72 hours. Thyroid Function Tests: Recent Labs    03/06/20 1437  TSH 1.228   Anemia Panel: Recent Labs    03/06/20 1745  VITAMINB12 486  FERRITIN 1,884*   Sepsis Labs: Recent Labs  Lab 03/07/20 0900  PROCALCITON 1.11    Recent Results (from the past 240 hour(s))  Blood culture (routine  x 2)     Status: None (Preliminary result)   Collection Time: 03/06/20  2:37 PM   Specimen: BLOOD RIGHT FOREARM  Result Value Ref Range Status   Specimen Description BLOOD RIGHT FOREARM  Final   Special Requests   Final    BOTTLES DRAWN AEROBIC AND ANAEROBIC Blood Culture results may not be optimal due to an inadequate volume of blood received in culture bottles   Culture   Final    NO GROWTH < 24 HOURS Performed at [facility], [address], [place], [place] [address]    Report Status PENDING  Incomplete  Blood culture (routine x 2)     Status: None (Preliminary result)   Collection Time: 03/06/20  3:15 PM   Specimen: BLOOD  Result Value Ref Range Status   Specimen Description BLOOD RIGHT ANTECUBITAL  Final   Special Requests   Final    BOTTLES DRAWN  AEROBIC AND ANAEROBIC Blood Culture results may not be optimal due to an inadequate volume of blood received in culture bottles   Culture   Final    NO GROWTH < 24 HOURS Performed at [facility], [place] [address], [place], [place] [address]    Report Status PENDING  Incomplete  Respiratory Panel by RT PCR (Flu A&B, Covid) - Nasopharyngeal Swab     Status: None   Collection Time: 03/06/20 11:15 PM   Specimen: Nasopharyngeal Swab  Result Value Ref Range Status   SARS Coronavirus 2 by RT PCR NEGATIVE NEGATIVE Final    Comment: (NOTE) SARS-CoV-2 target nucleic acids are NOT DETECTED. The SARS-CoV-2 RNA is generally detectable in upper respiratoy specimens during the acute phase of infection. The lowest concentration of SARS-CoV-2 viral copies this assay can detect is 131 copies/mL. A negative result does not preclude SARS-Cov-2 infection and should not be used as the sole basis for treatment or other patient management decisions. A negative result may occur with  improper specimen collection/handling, submission of specimen other than nasopharyngeal swab, presence of viral mutation(s) within the areas targeted by this assay, and inadequate number of viral copies (<131 copies/mL). A negative result must be combined with clinical observations, patient history, and epidemiological information. The expected result is Negative. Fact Sheet for Patients:  PinkCheek.be Fact Sheet for Healthcare Providers:  GravelBags.it This test is not yet ap proved or cleared by the [place] FDA and  has been authorized for detection and/or diagnosis of SARS-CoV-2 by FDA under an Emergency Use Authorization (EUA). This EUA will remain  in effect (meaning this test can be used) for the duration of the COVID-19 declaration under Section 564(b)(1) of the Act, 21 U.S.C. section 360bbb-3(b)(1), unless the authorization is terminated or revoked  sooner.    Influenza A by PCR NEGATIVE NEGATIVE Final   Influenza B by PCR NEGATIVE NEGATIVE Final    Comment: (NOTE) The Xpert Xpress SARS-CoV-2/FLU/RSV assay is intended as an aid in  the diagnosis of influenza from Nasopharyngeal swab specimens and  should not be used as a sole basis for treatment. Nasal washings and  aspirates are unacceptable for Xpert Xpress SARS-CoV-2/FLU/RSV  testing. Fact Sheet for Patients: PinkCheek.be Fact Sheet for Healthcare Providers: GravelBags.it This test is not yet approved or cleared by the [place] FDA and  has been authorized for detection and/or diagnosis of SARS-CoV-2 by  FDA under an Emergency Use Authorization (EUA). This EUA will remain  in effect (meaning this test can be used) for the duration of  the  Covid-19 declaration under Section 564(b)(1) of the Act, 21  U.S.C. section 360bbb-3(b)(1), unless the authorization is  terminated or revoked. Performed at [facility], [place] [address], [place], [place] [address]          Radiology Studies: CT ABDOMEN PELVIS WO CONTRAST  Result Date: 03/06/2020 CLINICAL DATA:  Increasing falls. Swelling to the left arm and bruising over the right hip. Generalized weakness. EXAM: CT CHEST, ABDOMEN AND PELVIS WITHOUT CONTRAST TECHNIQUE: Multidetector CT imaging of the chest, abdomen and pelvis was performed following the standard protocol without IV contrast. COMPARISON:  November 10, 2019 FINDINGS: CT CHEST FINDINGS Cardiovascular: The heart is enlarged. There is no significant pericardial effusion. The intracardiac blood pool is hypodense relative to the adjacent myocardium consistent with anemia. There are atherosclerotic changes of the thoracic aorta without evidence for an aneurysm. There is a well-positioned tunneled dialysis catheter with tip terminating in the high right atrium. Mediastinum/Nodes: --No mediastinal or hilar  lymphadenopathy. --No axillary lymphadenopathy. --No supraclavicular lymphadenopathy. --Normal thyroid gland. --The esophagus is unremarkable Lungs/Pleura: There are moderate-sized bilateral pleural effusions, right greater than left. There is interlobular septal thickening bilaterally. There is a ground-glass airspace opacity in the right upper lobe (axial series 5, image 59). There is no pneumothorax. There is some bronchial wall thickening and mucus plugging at the lung bases bilaterally. The trachea is unremarkable. Musculoskeletal: There are healing left-sided rib fractures. CT ABDOMEN PELVIS FINDINGS Hepatobiliary: There is a large cyst in the right hepatic lobe which is stable from prior study. Normal gallbladder.There is no biliary ductal dilation. Pancreas: Normal contours without ductal dilatation. No peripancreatic fluid collection. Spleen: Unremarkable. Adrenals/Urinary Tract: --Adrenal glands: There is a stable benign lipid rich left adrenal adenoma. --Right kidney/ureter: The right kidney is atrophic. --Left kidney/ureter: The left kidney is atrophic. --Urinary bladder: Unremarkable. Stomach/Bowel: --Stomach/Duodenum: No hiatal hernia or other gastric abnormality. Normal duodenal course and caliber. --Small bowel: Unremarkable. --Colon: There is scattered colonic diverticula without CT evidence for diverticulitis. --Appendix: Normal. Vascular/Lymphatic: Atherosclerotic calcification is present within the non-aneurysmal abdominal aorta, without hemodynamically significant stenosis. --No retroperitoneal lymphadenopathy. --No mesenteric lymphadenopathy. --No pelvic or inguinal lymphadenopathy. Reproductive: Unremarkable Other: There is a small amount of free fluid in the patient's abdomen and pelvis. There is no free air. Musculoskeletal. There is a hematoma overlying the proximal lateral left hip measuring approximately 5.8 x 3.7 cm. The patient is status post total hip arthroplasty on the left. The  hardware appears intact where visualized. There is a chronic appearing fracture of the superior pubic ramus on the left. IMPRESSION: 1. Moderate-sized bilateral pleural effusions, right greater than left, with interlobular septal thickening, suggestive of interstitial pulmonary edema. 2. Ground-glass airspace opacity in the right upper lobe, concerning for pneumonia. 3. Status post left total hip arthroplasty. There is a 5.8 x 3.7 cm hematoma overlying the proximal lateral left hip. 4. Atrophic kidneys. 5. Colonic diverticulosis without CT evidence for diverticulitis. Aortic Atherosclerosis (ICD10-I70.0). Electronically Signed   By: Constance Holster M.D.   On: 03/06/2020 19:20   DG Chest 2 View  Result Date: 03/06/2020 CLINICAL DATA:  Falls over the past week.  Altered mental status. EXAM: CHEST - 2 VIEW COMPARISON:  12/08/2016 FINDINGS: Right IJ central venous catheter with tip over the SVC. Vascular stent projects over the left subclavian region and also over the medial left upper arm. Lungs are adequately inflated with minimal layering bilateral pleural fluid. No definite focal airspace process. No pneumothorax. Borderline stable cardiomegaly. No  acute fracture. IMPRESSION: 1.  Small bilateral layering pleural effusions. 2. Stable borderline cardiomegaly. Right IJ central venous catheter with tip over the SVC. Electronically Signed   By: Marin Olp M.D.   On: 03/06/2020 14:54   DG Elbow Complete Left  Result Date: 03/06/2020 CLINICAL DATA:  Several falls over the past week with left elbow pain. EXAM: LEFT ELBOW - COMPLETE 3+ VIEW COMPARISON:  None. FINDINGS: Minimal degenerative changes over the elbow. Displaced anterior fat pad likely joint effusion. No definite fracture visualized. Partial visualization of a vascular stent over the medial upper arm. Surgical clips over the soft tissues of the elbow. IMPRESSION: Joint effusion.  No definite fracture visualized. Electronically Signed   By: Marin Olp M.D.   On: 03/06/2020 14:57   CT Head Wo Contrast  Result Date: 03/06/2020 CLINICAL DATA:  Altered mental status. Increasing phones. EXAM: CT HEAD WITHOUT CONTRAST TECHNIQUE: Contiguous axial images were obtained from the base of the skull through the vertex without intravenous contrast. COMPARISON:  MRI brain from 07/05/2015 FINDINGS: Brain: Small chronic lacunar infarct in the head of the right caudate nucleus. Periventricular white matter and corona radiata hypodensities favor chronic ischemic microvascular white matter disease. New right frontoparietal temporal subacute subdural hematoma with lower density component anteriorly and higher density component posteriorly. Anterior components up to about 8 mm in thickness. No significant degree of midline shift. No appreciable acute CVA or mass lesion. Vascular: There is atherosclerotic calcification of the cavernous carotid arteries bilaterally. Skull: Unremarkable Sinuses/Orbits: As on the prior temporal bone CT of 08/09/2019, there is abnormal thickening of the osseous portion of the external auditory canal with some bony erosions. Other: No supplemental non-categorized findings. IMPRESSION: 1. Subacute right frontoparietotemporal subdural hematoma, 8 mm in thickness. No significant degree of midline shift. 2. Periventricular white matter and corona radiata hypodensities favor chronic ischemic microvascular white matter disease. 3. Small chronic lacunar infarct in the head of the right caudate nucleus. 4. Abnormal thickening of the osseous portion of the right external auditory canal with some bony erosions, query external auditory canal cholesteatoma. This is similar to that shown of the temporal bone CT of 08/19/2019. Electronically Signed   By: Van Clines M.D.   On: 03/06/2020 16:06   CT CHEST WO CONTRAST  Result Date: 03/06/2020 CLINICAL DATA:  Increasing falls. Swelling to the left arm and bruising over the right hip. Generalized weakness.  EXAM: CT CHEST, ABDOMEN AND PELVIS WITHOUT CONTRAST TECHNIQUE: Multidetector CT imaging of the chest, abdomen and pelvis was performed following the standard protocol without IV contrast. COMPARISON:  November 10, 2019 FINDINGS: CT CHEST FINDINGS Cardiovascular: The heart is enlarged. There is no significant pericardial effusion. The intracardiac blood pool is hypodense relative to the adjacent myocardium consistent with anemia. There are atherosclerotic changes of the thoracic aorta without evidence for an aneurysm. There is a well-positioned tunneled dialysis catheter with tip terminating in the high right atrium. Mediastinum/Nodes: --No mediastinal or hilar lymphadenopathy. --No axillary lymphadenopathy. --No supraclavicular lymphadenopathy. --Normal thyroid gland. --The esophagus is unremarkable Lungs/Pleura: There are moderate-sized bilateral pleural effusions, right greater than left. There is interlobular septal thickening bilaterally. There is a ground-glass airspace opacity in the right upper lobe (axial series 5, image 59). There is no pneumothorax. There is some bronchial wall thickening and mucus plugging at the lung bases bilaterally. The trachea is unremarkable. Musculoskeletal: There are healing left-sided rib fractures. CT ABDOMEN PELVIS FINDINGS Hepatobiliary: There is a large cyst in the right hepatic lobe which  is stable from prior study. Normal gallbladder.There is no biliary ductal dilation. Pancreas: Normal contours without ductal dilatation. No peripancreatic fluid collection. Spleen: Unremarkable. Adrenals/Urinary Tract: --Adrenal glands: There is a stable benign lipid rich left adrenal adenoma. --Right kidney/ureter: The right kidney is atrophic. --Left kidney/ureter: The left kidney is atrophic. --Urinary bladder: Unremarkable. Stomach/Bowel: --Stomach/Duodenum: No hiatal hernia or other gastric abnormality. Normal duodenal course and caliber. --Small bowel: Unremarkable. --Colon: There is  scattered colonic diverticula without CT evidence for diverticulitis. --Appendix: Normal. Vascular/Lymphatic: Atherosclerotic calcification is present within the non-aneurysmal abdominal aorta, without hemodynamically significant stenosis. --No retroperitoneal lymphadenopathy. --No mesenteric lymphadenopathy. --No pelvic or inguinal lymphadenopathy. Reproductive: Unremarkable Other: There is a small amount of free fluid in the patient's abdomen and pelvis. There is no free air. Musculoskeletal. There is a hematoma overlying the proximal lateral left hip measuring approximately 5.8 x 3.7 cm. The patient is status post total hip arthroplasty on the left. The hardware appears intact where visualized. There is a chronic appearing fracture of the superior pubic ramus on the left. IMPRESSION: 1. Moderate-sized bilateral pleural effusions, right greater than left, with interlobular septal thickening, suggestive of interstitial pulmonary edema. 2. Ground-glass airspace opacity in the right upper lobe, concerning for pneumonia. 3. Status post left total hip arthroplasty. There is a 5.8 x 3.7 cm hematoma overlying the proximal lateral left hip. 4. Atrophic kidneys. 5. Colonic diverticulosis without CT evidence for diverticulitis. Aortic Atherosclerosis (ICD10-I70.0). Electronically Signed   By: Constance Holster M.D.   On: 03/06/2020 19:20   CT Cervical Spine Wo Contrast  Result Date: 03/06/2020 CLINICAL DATA:  Fall, altered mental status. EXAM: CT CERVICAL SPINE WITHOUT CONTRAST TECHNIQUE: Multidetector CT imaging of the cervical spine was performed without intravenous contrast. Multiplanar CT image reconstructions were also generated. COMPARISON:  CT cervical spine 08/17/2017 FINDINGS: Alignment: Continued abnormal predental space, with 5 mm of space between the anterior arch of C1 in the odontoid, previously 4.5 mm back on 08/17/2017 by my measurements. The appearance suggests some degree of atlantoaxial instability,  but clearly seems to be a chronic finding. There is chronic grade 1 mild posterior subluxation at C3-4. Skull base and vertebrae: Acquired interbody fusions likely through fused spurring at C3-4, C4-5, C5-6, and potentially C7-T1. Fused facet joints on the left at C4-5 and C5-6. No acute fracture is identified. Severe spondylosis noted. Soft tissues and spinal canal: There is likely at least mild central narrowing of the thecal sac at the C1 level due to the atlantoaxial subluxation and a small amount of pannus posterior to the odontoid. Right central line noted. Bilateral common carotid atherosclerotic calcification. Disc levels: Uncinate and facet spurring causes right foraminal impingement at C2-3, C3-4, C6-7, and potentially C7-T1; and left foraminal impingement at C3-4, C4-5, and C7-T1. Upper chest: Branch vessel atherosclerotic vascular calcification. Suspected right pleural effusion. Other: No supplemental non-categorized findings. IMPRESSION: 1. No acute cervical spine findings. 2. Continued abnormal predental space, with 5 mm of space between the anterior arch of C1 in the odontoid, previously 4.5 mm back on 08/17/2017 by my measurements. The appearance suggests chronic atlantoaxial instability/subluxation, but clearly seems to be a chronic finding. This is likely contributing to central narrowing of the thecal sac at the C1 level. 3. Severe spondylosis causing multilevel impingement. 4. Suspected right pleural effusion. 5. Atherosclerosis. Critical Value/emergent results were called by telephone at the time of interpretation on 03/06/2020 at 4:14 pm to provider Dr, Eugenio Hoes, who verbally acknowledged these results. In particular we discussed the cervical  spine findings and also the intracranial findings including the presence of the subacute right subdural hematoma. Electronically Signed   By: Van Clines M.D.   On: 03/06/2020 16:14   DG Hips Bilat W or Wo Pelvis 5 Views  Result Date:  03/06/2020 CLINICAL DATA:  Recent falls with right hip pain. EXAM: DG HIP (WITH OR WITHOUT PELVIS) 5+V BILAT COMPARISON:  02/23/2015 FINDINGS: Left hip arthroplasty intact and unchanged. Mild degenerative changes of the right hip. Degenerative change of the spine and sacroiliac joints. No acute fracture or dislocation. IMPRESSION: No acute findings. Electronically Signed   By: Marin Olp M.D.   On: 03/06/2020 14:55        Scheduled Meds: . sodium chloride   Intravenous Once  . amoxicillin-clavulanate  500 mg Oral QHS  . calcitRIOL  1.5 mcg Oral Q M,W,F-HD  . Chlorhexidine Gluconate Cloth  6 each Topical Q0600  . cinacalcet  30 mg Oral Q M,W,F-HD  . citalopram  10 mg Oral Daily  . dextrose  1 ampule Intravenous Once  . ferric citrate  420 mg Oral TID WC  . insulin aspart  0-5 Units Subcutaneous QHS  . insulin aspart  0-6 Units Subcutaneous TID WC  . midodrine  10 mg Oral TID WC  . multivitamin with minerals  1 tablet Oral Daily  . rosuvastatin  20 mg Oral QHS   Continuous Infusions:   LOS: 2 days   Time spent= 35 mins    Darlys Buis Arsenio Loader, MD [facility]  If 7PM-7AM, please contact night-coverage  03/08/2020, 7:23 AM

## 2020-03-08 NOTE — Progress Notes (Signed)
[facility] Progress Note   Subjective:   Patient seen and examined at bedside in dialysis. Reports tolerating dialysis well so far.  Only complaint is swelling and pain in R elbow. States "there is something wrong with my arm." Denies SOB, CP, palpitations, n/v/d, abdominal pain, hip pain and headaches.   Objective Vitals:   03/08/20 0849 03/08/20 0900 03/08/20 0917 03/08/20 0930  BP: (!) 143/77 136/63 (!) 146/67 138/73  Pulse: 85 79 90 91  Resp: 20  (!) 25 (!) 25  Temp: 98.4 F (36.9 C)  98.4 F (36.9 C) 99 F (37.2 C)  TempSrc: Oral  Oral Oral  SpO2: 93%  94%   Weight:      Height:       Physical Exam General:NAD, pleasant, well appearing male Heart:regular rate, irregular rhythm Lungs:CTAB anterolaterally  Abdomen:soft, NTND Extremities:no LE edema Dialysis Access: TDC in use, LU AVG no b/t  Filed Weights   03/06/20 1159 03/07/20 0008 03/08/20 0720  Weight: 68 kg 77.1 kg 83.5 kg    Intake/Output Summary (Last 24 hours) at 03/08/2020 0950 Last data filed at 03/08/2020 0917 Gross per 24 hour  Intake 623.33 ml  Output --  Net 623.33 ml    Additional Objective Labs: Basic Metabolic Panel: Recent Labs  Lab 03/06/20 1229 03/07/20 0358 03/08/20 0303  NA 137 138 136  K 3.7 3.7 3.9  CL 90* 95* 93*  CO2 30 29 31   GLUCOSE 115* 101* 96  BUN 17 23 33*  CREATININE 4.98* 5.77* 7.37*  CALCIUM 9.5 9.3 9.4  PHOS  --   --  3.2   Liver Function Tests: Recent Labs  Lab 03/06/20 1437 03/07/20 0358 03/08/20 0303  AST 27 27 25   ALT 13 13 12   ALKPHOS 58 57 56  BILITOT 1.2 1.5* 1.4*  PROT 6.3* 6.0* 5.8*  ALBUMIN 3.4* 3.3* 3.2*   CBC: Recent Labs  Lab 03/06/20 1229 03/07/20 0358 03/08/20 0303  WBC 11.1* 10.8* 13.4*  HGB 7.0* 7.8* 7.3*  HCT 22.6* 24.5* 22.3*  MCV 100.9* 95.7 94.9  PLT 232 206 219   Blood Culture    Component Value Date/Time   SDES BLOOD RIGHT ANTECUBITAL 03/06/2020 1515   SPECREQUEST  03/06/2020 1515    BOTTLES DRAWN  AEROBIC AND ANAEROBIC Blood Culture results may not be optimal due to an inadequate volume of blood received in culture bottles   CULT  03/06/2020 1515    NO GROWTH 2 DAYS Performed at [facility], [address], [place], [place] [address]    REPTSTATUS PENDING 03/06/2020 1515    Cardiac Enzymes: Recent Labs  Lab 03/06/20 1745  CKTOTAL 181   CBG: Recent Labs  Lab 03/07/20 1759 03/07/20 1813 03/07/20 1832 03/07/20 2117 03/08/20 0601  GLUCAP 59* 52* 84 107* 74   Iron Studies:  Recent Labs    03/06/20 1745  FERRITIN 1,884*   Lab Results  Component Value Date   INR 1.1 11/27/2019   INR 1.24 10/05/2016   INR 1.36 12/06/2015   Studies/Results: CT ABDOMEN PELVIS WO CONTRAST  Result Date: 03/06/2020 CLINICAL DATA:  Increasing falls. Swelling to the left arm and bruising over the right hip. Generalized weakness. EXAM: CT CHEST, ABDOMEN AND PELVIS WITHOUT CONTRAST TECHNIQUE: Multidetector CT imaging of the chest, abdomen and pelvis was performed following the standard protocol without IV contrast. COMPARISON:  November 10, 2019 FINDINGS: CT CHEST FINDINGS Cardiovascular: The heart is enlarged. There is no significant pericardial effusion. The intracardiac blood  pool is hypodense relative to the adjacent myocardium consistent with anemia. There are atherosclerotic changes of the thoracic aorta without evidence for an aneurysm. There is a well-positioned tunneled dialysis catheter with tip terminating in the high right atrium. Mediastinum/Nodes: --No mediastinal or hilar lymphadenopathy. --No axillary lymphadenopathy. --No supraclavicular lymphadenopathy. --Normal thyroid gland. --The esophagus is unremarkable Lungs/Pleura: There are moderate-sized bilateral pleural effusions, right greater than left. There is interlobular septal thickening bilaterally. There is a ground-glass airspace opacity in the right upper lobe (axial series 5, image 59). There is no pneumothorax. There is some  bronchial wall thickening and mucus plugging at the lung bases bilaterally. The trachea is unremarkable. Musculoskeletal: There are healing left-sided rib fractures. CT ABDOMEN PELVIS FINDINGS Hepatobiliary: There is a large cyst in the right hepatic lobe which is stable from prior study. Normal gallbladder.There is no biliary ductal dilation. Pancreas: Normal contours without ductal dilatation. No peripancreatic fluid collection. Spleen: Unremarkable. Adrenals/Urinary Tract: --Adrenal glands: There is a stable benign lipid rich left adrenal adenoma. --Right kidney/ureter: The right kidney is atrophic. --Left kidney/ureter: The left kidney is atrophic. --Urinary bladder: Unremarkable. Stomach/Bowel: --Stomach/Duodenum: No hiatal hernia or other gastric abnormality. Normal duodenal course and caliber. --Small bowel: Unremarkable. --Colon: There is scattered colonic diverticula without CT evidence for diverticulitis. --Appendix: Normal. Vascular/Lymphatic: Atherosclerotic calcification is present within the non-aneurysmal abdominal aorta, without hemodynamically significant stenosis. --No retroperitoneal lymphadenopathy. --No mesenteric lymphadenopathy. --No pelvic or inguinal lymphadenopathy. Reproductive: Unremarkable Other: There is a small amount of free fluid in the patient's abdomen and pelvis. There is no free air. Musculoskeletal. There is a hematoma overlying the proximal lateral left hip measuring approximately 5.8 x 3.7 cm. The patient is status post total hip arthroplasty on the left. The hardware appears intact where visualized. There is a chronic appearing fracture of the superior pubic ramus on the left. IMPRESSION: 1. Moderate-sized bilateral pleural effusions, right greater than left, with interlobular septal thickening, suggestive of interstitial pulmonary edema. 2. Ground-glass airspace opacity in the right upper lobe, concerning for pneumonia. 3. Status post left total hip arthroplasty. There is a  5.8 x 3.7 cm hematoma overlying the proximal lateral left hip. 4. Atrophic kidneys. 5. Colonic diverticulosis without CT evidence for diverticulitis. Aortic Atherosclerosis (ICD10-I70.0). Electronically Signed   By: Constance Holster M.D.   On: 03/06/2020 19:20   DG Chest 2 View  Result Date: 03/06/2020 CLINICAL DATA:  Falls over the past week.  Altered mental status. EXAM: CHEST - 2 VIEW COMPARISON:  12/08/2016 FINDINGS: Right IJ central venous catheter with tip over the SVC. Vascular stent projects over the left subclavian region and also over the medial left upper arm. Lungs are adequately inflated with minimal layering bilateral pleural fluid. No definite focal airspace process. No pneumothorax. Borderline stable cardiomegaly. No acute fracture. IMPRESSION: 1.  Small bilateral layering pleural effusions. 2. Stable borderline cardiomegaly. Right IJ central venous catheter with tip over the SVC. Electronically Signed   By: Marin Olp M.D.   On: 03/06/2020 14:54   DG Elbow Complete Left  Result Date: 03/06/2020 CLINICAL DATA:  Several falls over the past week with left elbow pain. EXAM: LEFT ELBOW - COMPLETE 3+ VIEW COMPARISON:  None. FINDINGS: Minimal degenerative changes over the elbow. Displaced anterior fat pad likely joint effusion. No definite fracture visualized. Partial visualization of a vascular stent over the medial upper arm. Surgical clips over the soft tissues of the elbow. IMPRESSION: Joint effusion.  No definite fracture visualized. Electronically Signed   By: Quillian Quince  Derrel Nip M.D.   On: 03/06/2020 14:57   CT Head Wo Contrast  Result Date: 03/06/2020 CLINICAL DATA:  Altered mental status. Increasing phones. EXAM: CT HEAD WITHOUT CONTRAST TECHNIQUE: Contiguous axial images were obtained from the base of the skull through the vertex without intravenous contrast. COMPARISON:  MRI brain from 07/05/2015 FINDINGS: Brain: Small chronic lacunar infarct in the head of the right caudate nucleus.  Periventricular white matter and corona radiata hypodensities favor chronic ischemic microvascular white matter disease. New right frontoparietal temporal subacute subdural hematoma with lower density component anteriorly and higher density component posteriorly. Anterior components up to about 8 mm in thickness. No significant degree of midline shift. No appreciable acute CVA or mass lesion. Vascular: There is atherosclerotic calcification of the cavernous carotid arteries bilaterally. Skull: Unremarkable Sinuses/Orbits: As on the prior temporal bone CT of 08/09/2019, there is abnormal thickening of the osseous portion of the external auditory canal with some bony erosions. Other: No supplemental non-categorized findings. IMPRESSION: 1. Subacute right frontoparietotemporal subdural hematoma, 8 mm in thickness. No significant degree of midline shift. 2. Periventricular white matter and corona radiata hypodensities favor chronic ischemic microvascular white matter disease. 3. Small chronic lacunar infarct in the head of the right caudate nucleus. 4. Abnormal thickening of the osseous portion of the right external auditory canal with some bony erosions, query external auditory canal cholesteatoma. This is similar to that shown of the temporal bone CT of 08/19/2019. Electronically Signed   By: Van Clines M.D.   On: 03/06/2020 16:06   CT CHEST WO CONTRAST  Result Date: 03/06/2020 CLINICAL DATA:  Increasing falls. Swelling to the left arm and bruising over the right hip. Generalized weakness. EXAM: CT CHEST, ABDOMEN AND PELVIS WITHOUT CONTRAST TECHNIQUE: Multidetector CT imaging of the chest, abdomen and pelvis was performed following the standard protocol without IV contrast. COMPARISON:  November 10, 2019 FINDINGS: CT CHEST FINDINGS Cardiovascular: The heart is enlarged. There is no significant pericardial effusion. The intracardiac blood pool is hypodense relative to the adjacent myocardium consistent with  anemia. There are atherosclerotic changes of the thoracic aorta without evidence for an aneurysm. There is a well-positioned tunneled dialysis catheter with tip terminating in the high right atrium. Mediastinum/Nodes: --No mediastinal or hilar lymphadenopathy. --No axillary lymphadenopathy. --No supraclavicular lymphadenopathy. --Normal thyroid gland. --The esophagus is unremarkable Lungs/Pleura: There are moderate-sized bilateral pleural effusions, right greater than left. There is interlobular septal thickening bilaterally. There is a ground-glass airspace opacity in the right upper lobe (axial series 5, image 59). There is no pneumothorax. There is some bronchial wall thickening and mucus plugging at the lung bases bilaterally. The trachea is unremarkable. Musculoskeletal: There are healing left-sided rib fractures. CT ABDOMEN PELVIS FINDINGS Hepatobiliary: There is a large cyst in the right hepatic lobe which is stable from prior study. Normal gallbladder.There is no biliary ductal dilation. Pancreas: Normal contours without ductal dilatation. No peripancreatic fluid collection. Spleen: Unremarkable. Adrenals/Urinary Tract: --Adrenal glands: There is a stable benign lipid rich left adrenal adenoma. --Right kidney/ureter: The right kidney is atrophic. --Left kidney/ureter: The left kidney is atrophic. --Urinary bladder: Unremarkable. Stomach/Bowel: --Stomach/Duodenum: No hiatal hernia or other gastric abnormality. Normal duodenal course and caliber. --Small bowel: Unremarkable. --Colon: There is scattered colonic diverticula without CT evidence for diverticulitis. --Appendix: Normal. Vascular/Lymphatic: Atherosclerotic calcification is present within the non-aneurysmal abdominal aorta, without hemodynamically significant stenosis. --No retroperitoneal lymphadenopathy. --No mesenteric lymphadenopathy. --No pelvic or inguinal lymphadenopathy. Reproductive: Unremarkable Other: There is a small amount of free fluid  in the  patient's abdomen and pelvis. There is no free air. Musculoskeletal. There is a hematoma overlying the proximal lateral left hip measuring approximately 5.8 x 3.7 cm. The patient is status post total hip arthroplasty on the left. The hardware appears intact where visualized. There is a chronic appearing fracture of the superior pubic ramus on the left. IMPRESSION: 1. Moderate-sized bilateral pleural effusions, right greater than left, with interlobular septal thickening, suggestive of interstitial pulmonary edema. 2. Ground-glass airspace opacity in the right upper lobe, concerning for pneumonia. 3. Status post left total hip arthroplasty. There is a 5.8 x 3.7 cm hematoma overlying the proximal lateral left hip. 4. Atrophic kidneys. 5. Colonic diverticulosis without CT evidence for diverticulitis. Aortic Atherosclerosis (ICD10-I70.0). Electronically Signed   By: Constance Holster M.D.   On: 03/06/2020 19:20   CT Cervical Spine Wo Contrast  Result Date: 03/06/2020 CLINICAL DATA:  Fall, altered mental status. EXAM: CT CERVICAL SPINE WITHOUT CONTRAST TECHNIQUE: Multidetector CT imaging of the cervical spine was performed without intravenous contrast. Multiplanar CT image reconstructions were also generated. COMPARISON:  CT cervical spine 08/17/2017 FINDINGS: Alignment: Continued abnormal predental space, with 5 mm of space between the anterior arch of C1 in the odontoid, previously 4.5 mm back on 08/17/2017 by my measurements. The appearance suggests some degree of atlantoaxial instability, but clearly seems to be a chronic finding. There is chronic grade 1 mild posterior subluxation at C3-4. Skull base and vertebrae: Acquired interbody fusions likely through fused spurring at C3-4, C4-5, C5-6, and potentially C7-T1. Fused facet joints on the left at C4-5 and C5-6. No acute fracture is identified. Severe spondylosis noted. Soft tissues and spinal canal: There is likely at least mild central narrowing of the  thecal sac at the C1 level due to the atlantoaxial subluxation and a small amount of pannus posterior to the odontoid. Right central line noted. Bilateral common carotid atherosclerotic calcification. Disc levels: Uncinate and facet spurring causes right foraminal impingement at C2-3, C3-4, C6-7, and potentially C7-T1; and left foraminal impingement at C3-4, C4-5, and C7-T1. Upper chest: Branch vessel atherosclerotic vascular calcification. Suspected right pleural effusion. Other: No supplemental non-categorized findings. IMPRESSION: 1. No acute cervical spine findings. 2. Continued abnormal predental space, with 5 mm of space between the anterior arch of C1 in the odontoid, previously 4.5 mm back on 08/17/2017 by my measurements. The appearance suggests chronic atlantoaxial instability/subluxation, but clearly seems to be a chronic finding. This is likely contributing to central narrowing of the thecal sac at the C1 level. 3. Severe spondylosis causing multilevel impingement. 4. Suspected right pleural effusion. 5. Atherosclerosis. Critical Value/emergent results were called by telephone at the time of interpretation on 03/06/2020 at 4:14 pm to provider Dr, Eugenio Hoes, who verbally acknowledged these results. In particular we discussed the cervical spine findings and also the intracranial findings including the presence of the subacute right subdural hematoma. Electronically Signed   By: Van Clines M.D.   On: 03/06/2020 16:14   DG Hips Bilat W or Wo Pelvis 5 Views  Result Date: 03/06/2020 CLINICAL DATA:  Recent falls with right hip pain. EXAM: DG HIP (WITH OR WITHOUT PELVIS) 5+V BILAT COMPARISON:  02/23/2015 FINDINGS: Left hip arthroplasty intact and unchanged. Mild degenerative changes of the right hip. Degenerative change of the spine and sacroiliac joints. No acute fracture or dislocation. IMPRESSION: No acute findings. Electronically Signed   By: Marin Olp M.D.   On: 03/06/2020 14:55     Medications:  . sodium chloride   Intravenous Once  .  amoxicillin-clavulanate  500 mg Oral QHS  . calcitRIOL  1.5 mcg Oral Q M,W,F-HD  . Chlorhexidine Gluconate Cloth  6 each Topical Q0600  . cinacalcet  30 mg Oral Q M,W,F-HD  . citalopram  10 mg Oral Daily  . dextrose  1 ampule Intravenous Once  . ferric citrate  420 mg Oral TID WC  . insulin aspart  0-5 Units Subcutaneous QHS  . insulin aspart  0-6 Units Subcutaneous TID WC  . midodrine  10 mg Oral TID WC  . multivitamin with minerals  1 tablet Oral Daily  . rosuvastatin  20 mg Oral QHS    Dialysis Orders: [facility] on MWF. Time: 4 hours, 180NRe, BFR 400, DFR Auto 1.5, EDW 66kg, TDC (AVG currently unusable) Sensipar 30mg  PO q HD Heparin 2000 unit bolus Calcitriol 1.5 mcg PO q HD  Midodrine 2.5mg  PO TID Auryxia 1 tab PO TID with meals  Assessment/Plan: 1. Confusion/weakness: Baseline dementia w/worsened confusion over last few days.  BC negative, Resp panel neg, UC pending. Augmentin started for suspected aspiration PNA.     2. Recent falls/subdural hematoma: Mechanical fall 4/28. CT head/abd 5/1:subacute subdural hematoma, L hip hematoma. Conservative care per neuro. Monitor hip per ortho. NO Heparin..  3.  ESRD:  HD MWF.  HD today per regular schedule.  NO HEPARIN. K 3.9.  4.  Hypotension/volume: Appears euvolemic on exam, weights all over the place, varying by 20kg.  Net UFG 2L. BP mildly elevated this AM.  History of orthostatic hypotension w/ recent falls.  On midodrine as OP, hold if BP remains elevated.   5.  Anemia: Hgb 7.3 s/p 2 units pRBC, 1 given after labs collected.   Decreased from 9.4 on 4/28. Recently finished course of venofer. Aranesp 6mcg qwk ordered.  6.  Metabolic bone disease: Calcium 9.9, phos 3.2. Continue sensipar, VDRA and binders.  7.  Nutrition:  Renal diet with fluid restriciton.   Jen Mow, PA-C [facility] Pager: [phone] 03/08/2020,9:50 AM   LOS: 2 days

## 2020-03-09 ENCOUNTER — Ambulatory Visit: Payer: Medicare Other | Admitting: Internal Medicine

## 2020-03-09 LAB — COMPREHENSIVE METABOLIC PANEL
ALT: 13 U/L (ref 0–44)
AST: 26 U/L (ref 15–41)
Albumin: 3 g/dL — ABNORMAL LOW (ref 3.5–5.0)
Alkaline Phosphatase: 54 U/L (ref 38–126)
Anion gap: 14 (ref 5–15)
BUN: 17 mg/dL (ref 8–23)
CO2: 28 mmol/L (ref 22–32)
Calcium: 9.1 mg/dL (ref 8.9–10.3)
Chloride: 92 mmol/L — ABNORMAL LOW (ref 98–111)
Creatinine, Ser: 4.57 mg/dL — ABNORMAL HIGH (ref 0.61–1.24)
GFR calc Af Amer: 12 mL/min — ABNORMAL LOW (ref >60)
GFR calc non Af Amer: 11 mL/min — ABNORMAL LOW (ref >60)
Glucose, Bld: 101 mg/dL — ABNORMAL HIGH (ref 70–99)
Potassium: 3.9 mmol/L (ref 3.5–5.1)
Sodium: 134 mmol/L — ABNORMAL LOW (ref 135–145)
Total Bilirubin: 1.4 mg/dL — ABNORMAL HIGH (ref 0.3–1.2)
Total Protein: 5.8 g/dL — ABNORMAL LOW (ref 6.5–8.1)

## 2020-03-09 LAB — TYPE AND SCREEN
ABO/RH(D): O POS
Antibody Screen: NEGATIVE
Unit division: 0
Unit division: 0

## 2020-03-09 LAB — BPAM RBC
Blood Product Expiration Date: 202105292359
Blood Product Expiration Date: 202105292359
ISSUE DATE / TIME: 202105012150
ISSUE DATE / TIME: 202105030854
Unit Type and Rh: 5100
Unit Type and Rh: 5100

## 2020-03-09 LAB — MAGNESIUM: Magnesium: 2 mg/dL (ref 1.7–2.4)

## 2020-03-09 LAB — CBC
HCT: 26.4 % — ABNORMAL LOW (ref 39.0–52.0)
Hemoglobin: 8.9 g/dL — ABNORMAL LOW (ref 13.0–17.0)
MCH: 31.7 pg (ref 26.0–34.0)
MCHC: 33.7 g/dL (ref 30.0–36.0)
MCV: 94 fL (ref 80.0–100.0)
Platelets: 225 10*3/uL (ref 150–400)
RBC: 2.81 MIL/uL — ABNORMAL LOW (ref 4.22–5.81)
RDW: 17.1 % — ABNORMAL HIGH (ref 11.5–15.5)
WBC: 12.6 10*3/uL — ABNORMAL HIGH (ref 4.0–10.5)
nRBC: 0.2 % (ref 0.0–0.2)

## 2020-03-09 LAB — GLUCOSE, CAPILLARY
Glucose-Capillary: 84 mg/dL (ref 70–99)
Glucose-Capillary: 90 mg/dL (ref 70–99)
Glucose-Capillary: 98 mg/dL (ref 70–99)

## 2020-03-09 MED ORDER — DARBEPOETIN ALFA 60 MCG/0.3ML IJ SOSY: 60.0000 ug | PREFILLED_SYRINGE | INTRAMUSCULAR | Status: AC

## 2020-03-09 MED ORDER — CINACALCET HCL 30 MG PO TABS: 30.0000 mg | ORAL_TABLET | ORAL | 0 refills | Status: AC

## 2020-03-09 MED ORDER — AMOXICILLIN-POT CLAVULANATE 500-125 MG PO TABS: 500.0000 mg | ORAL_TABLET | Freq: Every day | ORAL | 0 refills | Status: DC

## 2020-03-09 MED ORDER — CALCITRIOL 0.5 MCG PO CAPS: 1.5000 ug | ORAL_CAPSULE | ORAL | 0 refills | Status: AC

## 2020-03-09 NOTE — Progress Notes (Signed)
[facility] Progress Note   Subjective:   Seen and examined in room.  Sitting in bedside chair.  Says today is a bad day.  Complains of elbow and hip pain. Denies SOB, CP, abdominal pain, and n/v/d.  Objective Vitals:   03/08/20 1937 03/08/20 2323 03/09/20 0345 03/09/20 0752  BP: (!) 150/78 137/87 (!) 156/75 (!) 151/75  Pulse: 87 85 (!) 50 90  Resp: 16 17 19 18   Temp: 98.5 F (36.9 C) 98.5 F (36.9 C) 98 F (36.7 C) 97.8 F (36.6 C)  TempSrc: Oral Oral Oral Oral  SpO2: 96% 97% 93% 99%  Weight:      Height:       Physical Exam General: pleasant elderly male in NAD, sitting in chair Heart:RR, IR Lungs:CTAB Abdomen:soft, NTND Extremities:no LE edema Dialysis Access: TDC, LU AVG no b/t   Filed Weights   03/07/20 0008 03/08/20 0720 03/08/20 1130  Weight: 77.1 kg 83.5 kg 82.6 kg    Intake/Output Summary (Last 24 hours) at 03/09/2020 1128 Last data filed at 03/09/2020 0175 Gross per 24 hour  Intake 360 ml  Output 2001 ml  Net -1641 ml    Additional Objective Labs: Basic Metabolic Panel: Recent Labs  Lab 03/07/20 0358 03/07/20 0358 03/08/20 0303 03/08/20 1303 03/09/20 0403  NA 138  --  136  --  134*  K 3.7  --  3.9  --  3.9  CL 95*  --  93*  --  92*  CO2 29  --  31  --  28  GLUCOSE 101*   < > 96 134* 101*  BUN 23  --  33*  --  17  CREATININE 5.77*  --  7.37*  --  4.57*  CALCIUM 9.3  --  9.4  --  9.1  PHOS  --   --  3.2  --   --    < > = values in this interval not displayed.   Liver Function Tests: Recent Labs  Lab 03/07/20 0358 03/08/20 0303 03/09/20 0403  AST 27 25 26   ALT 13 12 13   ALKPHOS 57 56 54  BILITOT 1.5* 1.4* 1.4*  PROT 6.0* 5.8* 5.8*  ALBUMIN 3.3* 3.2* 3.0*   No results for input(s): LIPASE, AMYLASE in the last 168 hours. CBC: Recent Labs  Lab 03/06/20 1229 03/06/20 1229 03/07/20 0358 03/08/20 0303 03/09/20 0403  WBC 11.1*   < > 10.8* 13.4* 12.6*  HGB 7.0*   < > 7.8* 7.3* 8.9*  HCT 22.6*   < > 24.5* 22.3* 26.4*   MCV 100.9*  --  95.7 94.9 94.0  PLT 232   < > 206 219 225   < > = values in this interval not displayed.   Blood Culture    Component Value Date/Time   SDES BLOOD RIGHT ANTECUBITAL 03/06/2020 1515   SPECREQUEST  03/06/2020 1515    BOTTLES DRAWN AEROBIC AND ANAEROBIC Blood Culture results may not be optimal due to an inadequate volume of blood received in culture bottles   CULT  03/06/2020 1515    NO GROWTH 3 DAYS Performed at [facility], [place] [address], [place], [place] [address]    REPTSTATUS PENDING 03/06/2020 1515    Cardiac Enzymes: Recent Labs  Lab 03/06/20 1745  CKTOTAL 181   CBG: Recent Labs  Lab 03/08/20 1535 03/08/20 1618 03/08/20 2118 03/09/20 0624 03/09/20 1011  GLUCAP 83 108* 95 84 90   Iron Studies:  Recent Labs  03/06/20 1745  FERRITIN 1,884*   Lab Results  Component Value Date   INR 1.1 11/27/2019   INR 1.24 10/05/2016   INR 1.36 12/06/2015   Studies/Results: No results found.  Medications:  . sodium chloride   Intravenous Once  . amoxicillin-clavulanate  500 mg Oral QHS  . calcitRIOL  1.5 mcg Oral Q M,W,F-HD  . Chlorhexidine Gluconate Cloth  6 each Topical Q0600  . cinacalcet  30 mg Oral Q M,W,F-HD  . citalopram  10 mg Oral Daily  . darbepoetin (ARANESP) injection - DIALYSIS  60 mcg Intravenous Q Mon-HD  . dextrose  1 ampule Intravenous Once  . ferric citrate  420 mg Oral TID WC  . midodrine  10 mg Oral TID WC  . multivitamin with minerals  1 tablet Oral Daily  . rosuvastatin  20 mg Oral QHS    [facility] Time: 4 hours, 180NRe, BFR 400, DFR Auto 1.5, EDW 66kg, TDC (AVG currentlyunusable) Sensipar 30mg  PO q HD Heparin 2000 unit bolus Calcitriol 1.5 mcg PO q HD  Midodrine 2.5mg  PO TID Auryxia 1 tab PO TID with meals  Assessment/Plan: 1. Confusion/weakness:Baseline dementia w/worsened confusion over last few days.  BC negative, Resp panel neg, UC pending. Augmentin  started for suspected aspiration PNA 2. ?Aspiration PNA - to d/c home on Augmentin x5d to complete 7 d course 3. Recent falls/subdural hematoma: Mechanical fall 4/28. CT head/abd 5/1:subacute subdural hematoma, L hip hematoma. Conservative care per neuro. Monitor hip per ortho. NO Heparin..  4. ESRD:HD MWF.  HD tomorrow at OP clinic.  If still here will write orders for HD.  NO HEPARIN. K 3.9.  5. Hypotension/volume:Appears euvolemic on exam, weights all over the place, varying by 20kg.  Net UF removed yesterday 2L. BP mildly elevated this AM.  History of orthostatic hypotension w/ recent falls.  On midodrine as OP, hold if BP remains elevated.   6. Anemia:Hgb 8.9 s/p 2 units pRBC.   Decreased from 9.4 on 4/28. Recently finished course of venofer. Aranesp 11mcg given 5/3. 7. Metabolic bone disease:Calcium 9.9, phos 3.2. Continue sensipar, VDRA and binders.  8. Nutrition:Renal diet with fluid restriciton. 9. Dispo - to d/c home today  Jen Mow, PA-C [facility] Pager: [phone] 03/09/2020,11:28 AM  LOS: 3 days

## 2020-03-09 NOTE — Progress Notes (Signed)
D/C instructions provided to patient, denies questions/concerns at this time. Patient transported to front entrance via WC, tol well. 

## 2020-03-09 NOTE — TOC Transition Note (Signed)
Transition of Care [facility]) - CM/SW Discharge Note   Patient Details  Name: [name] MRN: [id number] Date of Birth: 08/02/1929  [facility] CM/SW Contact:  Pollie Friar, RN Phone Number: 03/09/2020, 1:37 PM   Clinical Narrative:    Pt discharging home with family. [facility] aware of d/c.  3 in 1 delivered to the room per [employer]. Son has declined the hospital bed for home recommended per OT.  Pt has supervision at home per son. Pt has transportation home today.    Final next level of care: Home w Home Health Services Barriers to Discharge: No Barriers Identified   Patient Goals and CMS Choice   CMS Medicare.gov Compare Post Acute Care list provided to:: Patient Represenative (must comment) Choice offered to / list presented to : Adult Children  Discharge Placement                       Discharge Plan and Services   Discharge Planning Services: CM Consult Post Acute Care Choice: Home Health                    HH Arranged: PT, OT [facility] Agency: [place] ([facility]) Date [place]: 03/08/20   Representative spoke with at [place]: [place] ([place]) Interventions     Readmission Risk Interventions No flowsheet data found.

## 2020-03-09 NOTE — Plan of Care (Signed)
Patient stable, discussed POC with patient, agreeable with plan, denies question/concerns at this time.   Problem: Education: Goal: Knowledge of General Education information will improve Description: Including pain rating scale, medication(s)/side effects and non-pharmacologic comfort measures Outcome: Adequate for Discharge   Problem: Health Behavior/Discharge Planning: Goal: Ability to manage health-related needs will improve Outcome: Adequate for Discharge   Problem: Clinical Measurements: Goal: Ability to maintain clinical measurements within normal limits will improve Outcome: Adequate for Discharge Goal: Will remain [name] from infection Outcome: Adequate for Discharge Goal: Diagnostic test results will improve Outcome: Adequate for Discharge Goal: Respiratory complications will improve Outcome: Adequate for Discharge Goal: Cardiovascular complication will be avoided Outcome: Adequate for Discharge   Problem: Activity: Goal: Risk for activity intolerance will decrease Outcome: Adequate for Discharge   Problem: Nutrition: Goal: Adequate nutrition will be maintained Outcome: Adequate for Discharge   Problem: Coping: Goal: Level of anxiety will decrease Outcome: Adequate for Discharge   Problem: Elimination: Goal: Will not experience complications related to bowel motility Outcome: Adequate for Discharge Goal: Will not experience complications related to urinary retention Outcome: Adequate for Discharge   Problem: Pain Managment: Goal: General experience of comfort will improve Outcome: Adequate for Discharge   Problem: Safety: Goal: Ability to remain [name] from injury will improve Outcome: Adequate for Discharge   Problem: Skin Integrity: Goal: Risk for impaired skin integrity will decrease Outcome: Adequate for Discharge   

## 2020-03-09 NOTE — Discharge Summary (Signed)
Physician Discharge Summary  Siraj Dermody Deboer ZOX:[id number] DOB: 16-Jul-1929 DOA: 03/06/2020  PCP: [name], MD  Admit date: 03/06/2020 Discharge date: 03/09/2020  Admitted From: Home Disposition: Home  Recommendations for Outpatient Follow-up:  1. Follow up with PCP in 1-2 weeks 2. CBC in 1 week with PCP 3. Oral Augmentin for 5 days 4. Calcitriol and cinacalcet prescribed  Home Health: PT/OT Equipment/Devices: Hospital bed? Discharge Condition: Stable CODE STATUS: Full code Diet recommendation: Renal  Brief/Interim Summary: 86 y.o.malewith medical history significant ofanemia of chronic disease, iron deficiency, depression, ESRD Monday Wednesday Friday, gout, HLD, orthostatic hypotension brought to the hospital for evaluation of confusion and generalized weakness.  Also his hemoglobin was noted to be 7.0, down from baseline of 10.0 with unclear etiology.  CT showed left hip hematoma.  Concerns of aspiration pneumonia along with urinary tract infection.  On Augmentin empirically.  Hospital course complicated by intermittent recurrent hypoglycemia.  This resolved as his appetite improved.  UA was suggestive of urinary tract infection, cultures were negative.  There was concern for possible aspiration pneumonia as well.  Will be discharged on Augmentin for 5 more days to complete 7-day course. PT recommended home health therefore arrangements made.  Speech and swallow recommended aspiration precaution otherwise continuing oral diet. Stable for discharge.   Assessment & Plan:   Principal Problem:   Subdural hematoma (HCC) Active Problems:   Gout   Iron deficiency anemia   Essential hypertension   HLD (hyperlipidemia)   ESRD on dialysis (HCC)   Orthostatic hypotension   Falls   General weakness  Multiple falls with generalized weakness Right-sided frontal parietal subacute hematoma without midline shift or mass-effect -Continue neurochecks. Seen by neurosurgery, no  surgical indication. Closely monitor. -PT/OT-home health -Blood cultures negative.  UA-UTI -CT head-subacute subdural hematoma without midline shift.  Discussed with neurosurgery, no need for repeat CT. CT cervical spine-chronic changes. Chest x-ray-some pleural effusion bilaterally, x-ray of hip and left elbow-negative for any acute pathology.  Acute on chronic iron deficiency anemia, also microcytic, stable Left hip Hematoma. 5.8x 3.7 cm Suspect from hip hematoma.  No signs of active bleeding.  Hemoglobin stable status post 2 units PRBC transfusion. -Iron studies and B12 normal.  TSH normal.  -Status post transfusion-5/1, 5/3 -CT chest abdomen pelvis without contrast -right upper lobe lung opacity, small left hip hematoma. -Discussed with Dr Alvan Dame on 5/1, orthopedic regarding hip hematoma.  Appears to be consistent due to falls.  Continue monitoring for now. -Outpatient CBC in 1 week and follow-up with PCP.  Acute metabolic encephalopathy; improved Urinary tract infection without hematuria -Exam is mostly nonfocal. Currently alert awake oriented X 2.Suspect underlying infection. Ongoing work-up. -Culture negative, on Augmentin empirically for 5 more days  Recurrent hypoglycemia Resolved, now antihypertensive products at home.  Right Upper Lobe opacity concerning for aspiration pneumonia versus pneumonitis Concerns for aspiration. Nebs Prn. S&S eval.  Recommend aspiration precaution.  Appreciate speech and swallow evaluation.  Antibiotics as mentioned above.  ESRD hemodialysis Monday Wednesday Friday -Nephro following, resume outpatient HD  Orthostatic hypotension -On midodrine  Hyperlipidemia -Statin  History of dementia with occasional behavioral disturbance -On Celexa. Supportive care.  Gout -Resume home meds     Discharge Diagnoses:  Principal Problem:   Subdural hematoma (HCC) Active Problems:   Gout   Iron deficiency anemia   Essential  hypertension   HLD (hyperlipidemia)   ESRD on dialysis ([place])   Orthostatic hypotension   Falls   General weakness    Consultations:  Nephrology  Subjective: Sitting up in the chair no complaints feels great overall.  Mentation at baseline.  Discharge Exam: Vitals:   03/09/20 0345 03/09/20 0752  BP: (!) 156/75 (!) 151/75  Pulse: (!) 50 90  Resp: 19 18  Temp: 98 F (36.7 C) 97.8 F (36.6 C)  SpO2: 93% 99%   Vitals:   03/08/20 1937 03/08/20 2323 03/09/20 0345 03/09/20 0752  BP: (!) 150/78 137/87 (!) 156/75 (!) 151/75  Pulse: 87 85 (!) 50 90  Resp: 16 17 19 18   Temp: 98.5 F (36.9 C) 98.5 F (36.9 C) 98 F (36.7 C) 97.8 F (36.6 C)  TempSrc: Oral Oral Oral Oral  SpO2: 96% 97% 93% 99%  Weight:      Height:        General: Pt is alert, awake, not in acute distress, elderly frail. Cardiovascular: RRR, S1/S2 +, no rubs, no gallops Respiratory: CTA bilaterally, no wheezing, no rhonchi Abdominal: Soft, NT, ND, bowel sounds + Extremities: no edema, no cyanosis Right chest wall dialysis catheter noted.  Discharge Instructions   Allergies as of 03/09/2020   No Known Allergies     Medication List    TAKE these medications   allopurinol 100 MG tablet Commonly known as: ZYLOPRIM TAKE 1 TABLET BY MOUTH EVERY DAY   amoxicillin-clavulanate 500-125 MG tablet Commonly known as: AUGMENTIN Take 1 tablet (500 mg total) by mouth at bedtime for 5 days.   aspirin EC 81 MG tablet Take 1 tablet (81 mg total) by mouth daily.   Auryxia 1 GM 210 MG(Fe) tablet Generic drug: ferric citrate Take 420 mg by mouth 3 (three) times daily with meals.   calcitRIOL 0.5 MCG capsule Commonly known as: ROCALTROL Take 3 capsules (1.5 mcg total) by mouth every Monday, Wednesday, and Friday with hemodialysis. Start taking on: Mar 10, 2020   cinacalcet 30 MG tablet Commonly known as: SENSIPAR Take 1 tablet (30 mg total) by mouth every Monday, Wednesday, and Friday with  hemodialysis. Start taking on: Mar 10, 2020   citalopram 10 MG tablet Commonly known as: CELEXA Take 1 tablet (10 mg total) by mouth daily.   Darbepoetin Alfa 60 MCG/0.3ML Sosy injection Commonly known as: ARANESP Inject 0.3 mLs (60 mcg total) into the vein every Monday with hemodialysis. Start taking on: Mar 15, 2020   latanoprost 0.005 % ophthalmic solution Commonly known as: XALATAN Place 1 drop into both eyes at bedtime.   lidocaine-prilocaine cream Commonly known as: EMLA Apply 1 application topically See admin instructions. Apply to access site 1 hour before dialysis. Cover with saran wrap   midodrine 10 MG tablet Commonly known as: PROAMATINE Take 1 tablet (10 mg total) by mouth 3 (three) times daily. Take in the morning, lunch time and 4pm. Do not take after 4pm.   multivitamin with minerals Tabs tablet Take 1 tablet by mouth daily.   nystatin powder Commonly known as: MYCOSTATIN/NYSTOP Use as directed twice per day What changed:   how much to take  how to take this  when to take this  reasons to take this  additional instructions   rosuvastatin 20 MG tablet Commonly known as: CRESTOR Take 1 tablet (20 mg total) by mouth at bedtime.   Vitamin D (Ergocalciferol) 1.25 MG (50000 UNIT) Caps capsule Commonly known as: DRISDOL Take 1 capsule (50,000 Units total) by mouth every 7 (seven) days.            Durable Medical Equipment  (From admission, onward)  Start     Ordered   03/09/20 0901  For home use only DME 3 n 1  Once     03/09/20 0900         Follow-up Information    [name], MD. Schedule an appointment as soon as possible for a visit in 1 week(s).   Specialties: Internal Medicine, Radiology Contact information: [place] [address] [phone]          No Known Allergies  You were cared for by a hospitalist during your hospital stay. If you have any questions about your discharge medications or  the care you received while you were in the hospital after you are discharged, you can call the unit and asked to speak with the hospitalist on call if the hospitalist that took care of you is not available. Once you are discharged, your primary care physician will handle any further medical issues. Please note that no refills for any discharge medications will be authorized once you are discharged, as it is imperative that you return to your primary care physician (or establish a relationship with a primary care physician if you do not have one) for your aftercare needs so that they can reassess your need for medications and monitor your lab values.   Procedures/Studies: CT ABDOMEN PELVIS WO CONTRAST  Result Date: 03/06/2020 CLINICAL DATA:  Increasing falls. Swelling to the left arm and bruising over the right hip. Generalized weakness. EXAM: CT CHEST, ABDOMEN AND PELVIS WITHOUT CONTRAST TECHNIQUE: Multidetector CT imaging of the chest, abdomen and pelvis was performed following the standard protocol without IV contrast. COMPARISON:  November 10, 2019 FINDINGS: CT CHEST FINDINGS Cardiovascular: The heart is enlarged. There is no significant pericardial effusion. The intracardiac blood pool is hypodense relative to the adjacent myocardium consistent with anemia. There are atherosclerotic changes of the thoracic aorta without evidence for an aneurysm. There is a well-positioned tunneled dialysis catheter with tip terminating in the high right atrium. Mediastinum/Nodes: --No mediastinal or hilar lymphadenopathy. --No axillary lymphadenopathy. --No supraclavicular lymphadenopathy. --Normal thyroid gland. --The esophagus is unremarkable Lungs/Pleura: There are moderate-sized bilateral pleural effusions, right greater than left. There is interlobular septal thickening bilaterally. There is a ground-glass airspace opacity in the right upper lobe (axial series 5, image 59). There is no pneumothorax. There is some  bronchial wall thickening and mucus plugging at the lung bases bilaterally. The trachea is unremarkable. Musculoskeletal: There are healing left-sided rib fractures. CT ABDOMEN PELVIS FINDINGS Hepatobiliary: There is a large cyst in the right hepatic lobe which is stable from prior study. Normal gallbladder.There is no biliary ductal dilation. Pancreas: Normal contours without ductal dilatation. No peripancreatic fluid collection. Spleen: Unremarkable. Adrenals/Urinary Tract: --Adrenal glands: There is a stable benign lipid rich left adrenal adenoma. --Right kidney/ureter: The right kidney is atrophic. --Left kidney/ureter: The left kidney is atrophic. --Urinary bladder: Unremarkable. Stomach/Bowel: --Stomach/Duodenum: No hiatal hernia or other gastric abnormality. Normal duodenal course and caliber. --Small bowel: Unremarkable. --Colon: There is scattered colonic diverticula without CT evidence for diverticulitis. --Appendix: Normal. Vascular/Lymphatic: Atherosclerotic calcification is present within the non-aneurysmal abdominal aorta, without hemodynamically significant stenosis. --No retroperitoneal lymphadenopathy. --No mesenteric lymphadenopathy. --No pelvic or inguinal lymphadenopathy. Reproductive: Unremarkable Other: There is a small amount of free fluid in the patient's abdomen and pelvis. There is no free air. Musculoskeletal. There is a hematoma overlying the proximal lateral left hip measuring approximately 5.8 x 3.7 cm. The patient is status post total hip arthroplasty on the left. The  hardware appears intact where visualized. There is a chronic appearing fracture of the superior pubic ramus on the left. IMPRESSION: 1. Moderate-sized bilateral pleural effusions, right greater than left, with interlobular septal thickening, suggestive of interstitial pulmonary edema. 2. Ground-glass airspace opacity in the right upper lobe, concerning for pneumonia. 3. Status post left total hip arthroplasty. There is a  5.8 x 3.7 cm hematoma overlying the proximal lateral left hip. 4. Atrophic kidneys. 5. Colonic diverticulosis without CT evidence for diverticulitis. Aortic Atherosclerosis (ICD10-I70.0). Electronically Signed   By: Constance Holster M.D.   On: 03/06/2020 19:20   DG Chest 2 View  Result Date: 03/06/2020 CLINICAL DATA:  Falls over the past week.  Altered mental status. EXAM: CHEST - 2 VIEW COMPARISON:  12/08/2016 FINDINGS: Right IJ central venous catheter with tip over the SVC. Vascular stent projects over the left subclavian region and also over the medial left upper arm. Lungs are adequately inflated with minimal layering bilateral pleural fluid. No definite focal airspace process. No pneumothorax. Borderline stable cardiomegaly. No acute fracture. IMPRESSION: 1.  Small bilateral layering pleural effusions. 2. Stable borderline cardiomegaly. Right IJ central venous catheter with tip over the SVC. Electronically Signed   By: Marin Olp M.D.   On: 03/06/2020 14:54   DG Elbow Complete Left  Result Date: 03/06/2020 CLINICAL DATA:  Several falls over the past week with left elbow pain. EXAM: LEFT ELBOW - COMPLETE 3+ VIEW COMPARISON:  None. FINDINGS: Minimal degenerative changes over the elbow. Displaced anterior fat pad likely joint effusion. No definite fracture visualized. Partial visualization of a vascular stent over the medial upper arm. Surgical clips over the soft tissues of the elbow. IMPRESSION: Joint effusion.  No definite fracture visualized. Electronically Signed   By: Marin Olp M.D.   On: 03/06/2020 14:57   CT Head Wo Contrast  Result Date: 03/06/2020 CLINICAL DATA:  Altered mental status. Increasing phones. EXAM: CT HEAD WITHOUT CONTRAST TECHNIQUE: Contiguous axial images were obtained from the base of the skull through the vertex without intravenous contrast. COMPARISON:  MRI brain from 07/05/2015 FINDINGS: Brain: Small chronic lacunar infarct in the head of the right caudate nucleus.  Periventricular white matter and corona radiata hypodensities favor chronic ischemic microvascular white matter disease. New right frontoparietal temporal subacute subdural hematoma with lower density component anteriorly and higher density component posteriorly. Anterior components up to about 8 mm in thickness. No significant degree of midline shift. No appreciable acute CVA or mass lesion. Vascular: There is atherosclerotic calcification of the cavernous carotid arteries bilaterally. Skull: Unremarkable Sinuses/Orbits: As on the prior temporal bone CT of 08/09/2019, there is abnormal thickening of the osseous portion of the external auditory canal with some bony erosions. Other: No supplemental non-categorized findings. IMPRESSION: 1. Subacute right frontoparietotemporal subdural hematoma, 8 mm in thickness. No significant degree of midline shift. 2. Periventricular white matter and corona radiata hypodensities favor chronic ischemic microvascular white matter disease. 3. Small chronic lacunar infarct in the head of the right caudate nucleus. 4. Abnormal thickening of the osseous portion of the right external auditory canal with some bony erosions, query external auditory canal cholesteatoma. This is similar to that shown of the temporal bone CT of 08/19/2019. Electronically Signed   By: Van Clines M.D.   On: 03/06/2020 16:06   CT CHEST WO CONTRAST  Result Date: 03/06/2020 CLINICAL DATA:  Increasing falls. Swelling to the left arm and bruising over the right hip. Generalized weakness. EXAM: CT CHEST, ABDOMEN AND PELVIS WITHOUT CONTRAST TECHNIQUE:  Multidetector CT imaging of the chest, abdomen and pelvis was performed following the standard protocol without IV contrast. COMPARISON:  November 10, 2019 FINDINGS: CT CHEST FINDINGS Cardiovascular: The heart is enlarged. There is no significant pericardial effusion. The intracardiac blood pool is hypodense relative to the adjacent myocardium consistent with  anemia. There are atherosclerotic changes of the thoracic aorta without evidence for an aneurysm. There is a well-positioned tunneled dialysis catheter with tip terminating in the high right atrium. Mediastinum/Nodes: --No mediastinal or hilar lymphadenopathy. --No axillary lymphadenopathy. --No supraclavicular lymphadenopathy. --Normal thyroid gland. --The esophagus is unremarkable Lungs/Pleura: There are moderate-sized bilateral pleural effusions, right greater than left. There is interlobular septal thickening bilaterally. There is a ground-glass airspace opacity in the right upper lobe (axial series 5, image 59). There is no pneumothorax. There is some bronchial wall thickening and mucus plugging at the lung bases bilaterally. The trachea is unremarkable. Musculoskeletal: There are healing left-sided rib fractures. CT ABDOMEN PELVIS FINDINGS Hepatobiliary: There is a large cyst in the right hepatic lobe which is stable from prior study. Normal gallbladder.There is no biliary ductal dilation. Pancreas: Normal contours without ductal dilatation. No peripancreatic fluid collection. Spleen: Unremarkable. Adrenals/Urinary Tract: --Adrenal glands: There is a stable benign lipid rich left adrenal adenoma. --Right kidney/ureter: The right kidney is atrophic. --Left kidney/ureter: The left kidney is atrophic. --Urinary bladder: Unremarkable. Stomach/Bowel: --Stomach/Duodenum: No hiatal hernia or other gastric abnormality. Normal duodenal course and caliber. --Small bowel: Unremarkable. --Colon: There is scattered colonic diverticula without CT evidence for diverticulitis. --Appendix: Normal. Vascular/Lymphatic: Atherosclerotic calcification is present within the non-aneurysmal abdominal aorta, without hemodynamically significant stenosis. --No retroperitoneal lymphadenopathy. --No mesenteric lymphadenopathy. --No pelvic or inguinal lymphadenopathy. Reproductive: Unremarkable Other: There is a small amount of free fluid  in the patient's abdomen and pelvis. There is no free air. Musculoskeletal. There is a hematoma overlying the proximal lateral left hip measuring approximately 5.8 x 3.7 cm. The patient is status post total hip arthroplasty on the left. The hardware appears intact where visualized. There is a chronic appearing fracture of the superior pubic ramus on the left. IMPRESSION: 1. Moderate-sized bilateral pleural effusions, right greater than left, with interlobular septal thickening, suggestive of interstitial pulmonary edema. 2. Ground-glass airspace opacity in the right upper lobe, concerning for pneumonia. 3. Status post left total hip arthroplasty. There is a 5.8 x 3.7 cm hematoma overlying the proximal lateral left hip. 4. Atrophic kidneys. 5. Colonic diverticulosis without CT evidence for diverticulitis. Aortic Atherosclerosis (ICD10-I70.0). Electronically Signed   By: Constance Holster M.D.   On: 03/06/2020 19:20   CT Cervical Spine Wo Contrast  Result Date: 03/06/2020 CLINICAL DATA:  Fall, altered mental status. EXAM: CT CERVICAL SPINE WITHOUT CONTRAST TECHNIQUE: Multidetector CT imaging of the cervical spine was performed without intravenous contrast. Multiplanar CT image reconstructions were also generated. COMPARISON:  CT cervical spine 08/17/2017 FINDINGS: Alignment: Continued abnormal predental space, with 5 mm of space between the anterior arch of C1 in the odontoid, previously 4.5 mm back on 08/17/2017 by my measurements. The appearance suggests some degree of atlantoaxial instability, but clearly seems to be a chronic finding. There is chronic grade 1 mild posterior subluxation at C3-4. Skull base and vertebrae: Acquired interbody fusions likely through fused spurring at C3-4, C4-5, C5-6, and potentially C7-T1. Fused facet joints on the left at C4-5 and C5-6. No acute fracture is identified. Severe spondylosis noted. Soft tissues and spinal canal: There is likely at least mild central narrowing of the  thecal sac at the  C1 level due to the atlantoaxial subluxation and a small amount of pannus posterior to the odontoid. Right central line noted. Bilateral common carotid atherosclerotic calcification. Disc levels: Uncinate and facet spurring causes right foraminal impingement at C2-3, C3-4, C6-7, and potentially C7-T1; and left foraminal impingement at C3-4, C4-5, and C7-T1. Upper chest: Branch vessel atherosclerotic vascular calcification. Suspected right pleural effusion. Other: No supplemental non-categorized findings. IMPRESSION: 1. No acute cervical spine findings. 2. Continued abnormal predental space, with 5 mm of space between the anterior arch of C1 in the odontoid, previously 4.5 mm back on 08/17/2017 by my measurements. The appearance suggests chronic atlantoaxial instability/subluxation, but clearly seems to be a chronic finding. This is likely contributing to central narrowing of the thecal sac at the C1 level. 3. Severe spondylosis causing multilevel impingement. 4. Suspected right pleural effusion. 5. Atherosclerosis. Critical Value/emergent results were called by telephone at the time of interpretation on 03/06/2020 at 4:14 pm to provider Dr, Eugenio Hoes, who verbally acknowledged these results. In particular we discussed the cervical spine findings and also the intracranial findings including the presence of the subacute right subdural hematoma. Electronically Signed   By: Van Clines M.D.   On: 03/06/2020 16:14   DG Hips Bilat W or Wo Pelvis 5 Views  Result Date: 03/06/2020 CLINICAL DATA:  Recent falls with right hip pain. EXAM: DG HIP (WITH OR WITHOUT PELVIS) 5+V BILAT COMPARISON:  02/23/2015 FINDINGS: Left hip arthroplasty intact and unchanged. Mild degenerative changes of the right hip. Degenerative change of the spine and sacroiliac joints. No acute fracture or dislocation. IMPRESSION: No acute findings. Electronically Signed   By: Marin Olp M.D.   On: 03/06/2020 14:55      The  results of significant diagnostics from this hospitalization (including imaging, microbiology, ancillary and laboratory) are listed below for reference.     Microbiology: Recent Results (from the past 240 hour(s))  Urine culture     Status: None   Collection Time: 03/06/20  1:11 PM   Specimen: Urine, Random  Result Value Ref Range Status   Specimen Description URINE, RANDOM  Final   Special Requests NONE  Final   Culture   Final    NO GROWTH Performed at [facility], [address] [place], [place] [address]    Report Status 03/08/2020 FINAL  Final  Blood culture (routine x 2)     Status: None (Preliminary result)   Collection Time: 03/06/20  2:37 PM   Specimen: BLOOD RIGHT FOREARM  Result Value Ref Range Status   Specimen Description BLOOD RIGHT FOREARM  Final   Special Requests   Final    BOTTLES DRAWN AEROBIC AND ANAEROBIC Blood Culture results may not be optimal due to an inadequate volume of blood received in culture bottles   Culture   Final    NO GROWTH 3 DAYS Performed at [facility], [place] [address] [place], [place] [address]    Report Status PENDING  Incomplete  Blood culture (routine x 2)     Status: None (Preliminary result)   Collection Time: 03/06/20  3:15 PM   Specimen: BLOOD  Result Value Ref Range Status   Specimen Description BLOOD RIGHT ANTECUBITAL  Final   Special Requests   Final    BOTTLES DRAWN AEROBIC AND ANAEROBIC Blood Culture results may not be optimal due to an inadequate volume of blood received in culture bottles   Culture   Final    NO GROWTH 3 DAYS Performed  at [facility], [place] [address], [place], [place] [address]    Report Status PENDING  Incomplete  Respiratory Panel by RT PCR (Flu A&B, Covid) - Nasopharyngeal Swab     Status: None   Collection Time: 03/06/20 11:15 PM   Specimen: Nasopharyngeal Swab  Result Value Ref Range Status   SARS Coronavirus 2 by RT PCR NEGATIVE NEGATIVE Final    Comment: (NOTE) SARS-CoV-2  target nucleic acids are NOT DETECTED. The SARS-CoV-2 RNA is generally detectable in upper respiratoy specimens during the acute phase of infection. The lowest concentration of SARS-CoV-2 viral copies this assay can detect is 131 copies/mL. A negative result does not preclude SARS-Cov-2 infection and should not be used as the sole basis for treatment or other patient management decisions. A negative result may occur with  improper specimen collection/handling, submission of specimen other than nasopharyngeal swab, presence of viral mutation(s) within the areas targeted by this assay, and inadequate number of viral copies (<131 copies/mL). A negative result must be combined with clinical observations, patient history, and epidemiological information. The expected result is Negative. Fact Sheet for Patients:  PinkCheek.be Fact Sheet for Healthcare Providers:  GravelBags.it This test is not yet ap proved or cleared by the [place] FDA and  has been authorized for detection and/or diagnosis of SARS-CoV-2 by FDA under an Emergency Use Authorization (EUA). This EUA will remain  in effect (meaning this test can be used) for the duration of the COVID-19 declaration under Section 564(b)(1) of the Act, 21 U.S.C. section 360bbb-3(b)(1), unless the authorization is terminated or revoked sooner.    Influenza A by PCR NEGATIVE NEGATIVE Final   Influenza B by PCR NEGATIVE NEGATIVE Final    Comment: (NOTE) The Xpert Xpress SARS-CoV-2/FLU/RSV assay is intended as an aid in  the diagnosis of influenza from Nasopharyngeal swab specimens and  should not be used as a sole basis for treatment. Nasal washings and  aspirates are unacceptable for Xpert Xpress SARS-CoV-2/FLU/RSV  testing. Fact Sheet for Patients: PinkCheek.be Fact Sheet for Healthcare Providers: GravelBags.it This test is  not yet approved or cleared by the [place] FDA and  has been authorized for detection and/or diagnosis of SARS-CoV-2 by  FDA under an Emergency Use Authorization (EUA). This EUA will remain  in effect (meaning this test can be used) for the duration of the  Covid-19 declaration under Section 564(b)(1) of the Act, 21  U.S.C. section 360bbb-3(b)(1), unless the authorization is  terminated or revoked. Performed at [facility], [place] [address], [place], [place] [address]      Labs: BNP (last 3 results) No results for input(s): BNP in the last 8760 hours. Basic Metabolic Panel: Recent Labs  Lab 03/06/20 1229 03/07/20 0358 03/08/20 0303 03/08/20 1303 03/09/20 0403  NA 137 138 136  --  134*  K 3.7 3.7 3.9  --  3.9  CL 90* 95* 93*  --  92*  CO2 30 29 31   --  28  GLUCOSE 115* 101* 96 134* 101*  BUN 17 23 33*  --  17  CREATININE 4.98* 5.77* 7.37*  --  4.57*  CALCIUM 9.5 9.3 9.4  --  9.1  MG  --  2.1 2.1  --  2.0  PHOS  --   --  3.2  --   --    Liver Function Tests: Recent Labs  Lab 03/06/20 1437 03/07/20 0358 03/08/20 0303 03/09/20 0403  AST 27 27 25 26   ALT 13 13  12 13  ALKPHOS 58 57 56 54  BILITOT 1.2 1.5* 1.4* 1.4*  PROT 6.3* 6.0* 5.8* 5.8*  ALBUMIN 3.4* 3.3* 3.2* 3.0*   No results for input(s): LIPASE, AMYLASE in the last 168 hours. Recent Labs  Lab 03/06/20 1437  AMMONIA 18   CBC: Recent Labs  Lab 03/06/20 1229 03/07/20 0358 03/08/20 0303 03/09/20 0403  WBC 11.1* 10.8* 13.4* 12.6*  HGB 7.0* 7.8* 7.3* 8.9*  HCT 22.6* 24.5* 22.3* 26.4*  MCV 100.9* 95.7 94.9 94.0  PLT 232 206 219 225   Cardiac Enzymes: Recent Labs  Lab 03/06/20 1745  CKTOTAL 181   BNP: Invalid input(s): POCBNP CBG: Recent Labs  Lab 03/08/20 1335 03/08/20 1535 03/08/20 1618 03/08/20 2118 03/09/20 0624  GLUCAP 90 83 108* 95 84   D-Dimer No results for input(s): DDIMER in the last 72 hours. Hgb A1c Recent Labs    03/06/20 1745  HGBA1C 5.5   Lipid  Profile No results for input(s): CHOL, HDL, LDLCALC, TRIG, CHOLHDL, LDLDIRECT in the last 72 hours. Thyroid function studies Recent Labs    03/06/20 1437  TSH 1.228   Anemia work up Recent Labs    03/06/20 1745  VITAMINB12 486  FERRITIN 1,884*   Urinalysis    Component Value Date/Time   COLORURINE YELLOW 03/07/2020 1128   APPEARANCEUR HAZY (A) 03/07/2020 1128   LABSPEC 1.008 03/07/2020 1128   PHURINE 9.0 (H) 03/07/2020 1128   GLUCOSEU NEGATIVE 03/07/2020 1128   GLUCOSEU NEGATIVE 01/30/2020 1518   HGBUR SMALL (A) 03/07/2020 1128   BILIRUBINUR NEGATIVE 03/07/2020 1128   KETONESUR NEGATIVE 03/07/2020 1128   PROTEINUR 100 (A) 03/07/2020 1128   UROBILINOGEN 0.2 01/30/2020 1518   NITRITE NEGATIVE 03/07/2020 1128   LEUKOCYTESUR LARGE (A) 03/07/2020 1128   Sepsis Labs Invalid input(s): PROCALCITONIN,  WBC,  LACTICIDVEN Microbiology Recent Results (from the past 240 hour(s))  Urine culture     Status: None   Collection Time: 03/06/20  1:11 PM   Specimen: Urine, Random  Result Value Ref Range Status   Specimen Description URINE, RANDOM  Final   Special Requests NONE  Final   Culture   Final    NO GROWTH Performed at [facility], [place] [address], [place], [place] [address]    Report Status 03/08/2020 FINAL  Final  Blood culture (routine x 2)     Status: None (Preliminary result)   Collection Time: 03/06/20  2:37 PM   Specimen: BLOOD RIGHT FOREARM  Result Value Ref Range Status   Specimen Description BLOOD RIGHT FOREARM  Final   Special Requests   Final    BOTTLES DRAWN AEROBIC AND ANAEROBIC Blood Culture results may not be optimal due to an inadequate volume of blood received in culture bottles   Culture   Final    NO GROWTH 3 DAYS Performed at [facility], [place] [address] [place],  [address]    Report Status PENDING  Incomplete  Blood culture (routine x 2)     Status: None (Preliminary result)   Collection Time: 03/06/20  3:15 PM   Specimen:  BLOOD  Result Value Ref Range Status   Specimen Description BLOOD RIGHT ANTECUBITAL  Final   Special Requests   Final    BOTTLES DRAWN AEROBIC AND ANAEROBIC Blood Culture results may not be optimal due to an inadequate volume of blood received in culture bottles   Culture   Final    NO GROWTH 3 DAYS Performed at [facility], [address] [place], [place] [address]    Report Status PENDING  Incomplete  Respiratory Panel by RT PCR (Flu A&B, Covid) - Nasopharyngeal Swab     Status: None   Collection Time: 03/06/20 11:15 PM   Specimen: Nasopharyngeal Swab  Result Value Ref Range Status   SARS Coronavirus 2 by RT PCR NEGATIVE NEGATIVE Final    Comment: (NOTE) SARS-CoV-2 target nucleic acids are NOT DETECTED. The SARS-CoV-2 RNA is generally detectable in upper respiratoy specimens during the acute phase of infection. The lowest concentration of SARS-CoV-2 viral copies this assay can detect is 131 copies/mL. A negative result does not preclude SARS-Cov-2 infection and should not be used as the sole basis for treatment or other patient management decisions. A negative result may occur with  improper specimen collection/handling, submission of specimen other than nasopharyngeal swab, presence of viral mutation(s) within the areas targeted by this assay, and inadequate number of viral copies (<131 copies/mL). A negative result must be combined with clinical observations, patient history, and epidemiological information. The expected result is Negative. Fact Sheet for Patients:  PinkCheek.be Fact Sheet for Healthcare Providers:  GravelBags.it This test is not yet ap proved or cleared by the [place] FDA and  has been authorized for detection and/or diagnosis of SARS-CoV-2 by FDA under an Emergency Use Authorization (EUA). This EUA will remain  in effect (meaning this test can be used) for the duration of the COVID-19  declaration under Section 564(b)(1) of the Act, 21 U.S.C. section 360bbb-3(b)(1), unless the authorization is terminated or revoked sooner.    Influenza A by PCR NEGATIVE NEGATIVE Final   Influenza B by PCR NEGATIVE NEGATIVE Final    Comment: (NOTE) The Xpert Xpress SARS-CoV-2/FLU/RSV assay is intended as an aid in  the diagnosis of influenza from Nasopharyngeal swab specimens and  should not be used as a sole basis for treatment. Nasal washings and  aspirates are unacceptable for Xpert Xpress SARS-CoV-2/FLU/RSV  testing. Fact Sheet for Patients: PinkCheek.be Fact Sheet for Healthcare Providers: GravelBags.it This test is not yet approved or cleared by the [place] FDA and  has been authorized for detection and/or diagnosis of SARS-CoV-2 by  FDA under an Emergency Use Authorization (EUA). This EUA will remain  in effect (meaning this test can be used) for the duration of the  Covid-19 declaration under Section 564(b)(1) of the Act, 21  U.S.C. section 360bbb-3(b)(1), unless the authorization is  terminated or revoked. Performed at [facility], [place] [address], [place], [place] [address]      Time coordinating discharge:  I have spent 35 minutes face to face with the patient and on the ward discussing the patients care, assessment, plan and disposition with other care givers. >50% of the time was devoted counseling the patient about the risks and benefits of treatment/Discharge disposition and coordinating care.   SIGNED:   Damita Lack, MD  [facility] 03/09/2020, 9:54 AM   If 7PM-7AM, please contact night-coverage

## 2020-03-09 NOTE — Plan of Care (Signed)

## 2020-03-09 NOTE — Progress Notes (Addendum)
Physical Therapy Treatment Patient Details Name: [name] MRN: [id number] DOB: 28-Aug-1929 Today's Date: 03/09/2020    History of Present Illness 86 y.o. male with medical history significant of dementia, depression, left hip replacement, rt rotator cuff repair, ESRD Monday Wednesday Friday, gout, HLD, orthostatic hypotension brought to the hospital for evaluation of confusion and generalized weakness.  According to his son patient has become progressively confused and weak for the past 3-4 weeks. In the ER CT head showed subacute rt frontotemporal subdural hematoma, was seen by neurosurgery wtih no further intervention planned. +Lt hip hematoma; xray of hip and lt elbow negative    PT Comments    Patient able to ambulate 70 ft with RW and min-mod A this session. Mod A required for sit to stand due to posterior bias. Pt oriented to self. PT will continue to follow acutely.   Follow Up Recommendations  Home health PT;Supervision/Assistance - 24 hour(if family can provide 24/7 & pt able to enter/exit for HD)     Equipment Recommendations  Other (comment)(TBD when family can confirm equipment at home)    Recommendations for Other Services       Precautions / Restrictions Precautions Precautions: Fall Restrictions Weight Bearing Restrictions: No    Mobility  Bed Mobility               General bed mobility comments: pt OOB in chair upon arrival   Transfers Overall transfer level: Needs assistance Equipment used: Rolling walker (2 wheeled) Transfers: Sit to/from Stand Sit to Stand: Mod assist         General transfer comment: assist to power up into standing; most assist needed once in standing as pt has heavy posterior bias; multimodal cues to weight shift anteriorly  Ambulation/Gait Ambulation/Gait assistance: Min assist;Mod assist Gait Distance (Feet): 70 Feet Assistive device: Rolling walker (2 wheeled) Gait Pattern/deviations: Step-through pattern;Decreased  stride length;Shuffle;Trunk flexed     General Gait Details: cues for safe use of AD and increased bilat step lengths; pt able to increase steps briefly and then shuffles; assist to steady and manage RW especially with turning   Stairs             Wheelchair Mobility    Modified Rankin (Stroke Patients Only) Modified Rankin (Stroke Patients Only) Pre-Morbid Rankin Score: (unknown; pt with dementia) Modified Rankin: Moderately severe disability     Balance Overall balance assessment: Needs assistance Sitting-balance support: Feet supported;No upper extremity supported Sitting balance-Leahy Scale: Fair     Standing balance support: Bilateral upper extremity supported Standing balance-Leahy Scale: Poor                              Cognition Arousal/Alertness: Awake/alert Behavior During Therapy: WFL for tasks assessed/performed Overall Cognitive Status: No family/caregiver present to determine baseline cognitive functioning                                 General Comments: h/o dementia; oriented to self; able to state name with cues; repeats statements/questions, pleasant and thanking me alot      Exercises      General Comments        Pertinent Vitals/Pain Pain Assessment: No/denies pain    Home Living                      Prior Function  PT Goals (current goals can now be found in the care plan section) Progress towards PT goals: Progressing toward goals    Frequency    Min 3X/week      PT Plan Current plan remains appropriate    Co-evaluation              AM-PAC PT "6 Clicks" Mobility   Outcome Measure  Help needed turning from your back to your side while in a flat bed without using bedrails?: A Little Help needed moving from lying on your back to sitting on the side of a flat bed without using bedrails?: A Little Help needed moving to and from a bed to a chair (including a wheelchair)?:  A Little Help needed standing up from a chair using your arms (e.g., wheelchair or bedside chair)?: A Little Help needed to walk in hospital room?: A Little Help needed climbing 3-5 steps with a railing? : A Lot 6 Click Score: 17    End of Session Equipment Utilized During Treatment: Gait belt Activity Tolerance: Patient tolerated treatment well Patient left: in chair;with call bell/phone within reach;with chair alarm set Nurse Communication: Mobility status PT Visit Diagnosis: Other abnormalities of gait and mobility (R26.89);Muscle weakness (generalized) (M62.81);Other symptoms and signs involving the nervous system (R29.898)     Time: 5809-9833 PT Time Calculation (min) (ACUTE ONLY): 16 min  Charges:  $Gait Training: 8-22 mins                     Earney Navy, PTA Acute Rehabilitation Services Pager: [phone] Office: [phone]     Darliss Cheney 03/09/2020, 11:25 AM

## 2020-03-10 ENCOUNTER — Emergency Department (HOSPITAL_COMMUNITY): Payer: Medicare Other

## 2020-03-10 ENCOUNTER — Inpatient Hospital Stay (HOSPITAL_COMMUNITY)
Admission: EM | Admit: 2020-03-10 | Discharge: 2020-03-22 | DRG: 640 | Disposition: A | Discharge status: Skilled Nursing Facility | Payer: Medicare Other | Source: Other Acute Inpatient Hospital | Attending: Internal Medicine | Admitting: Internal Medicine

## 2020-03-10 ENCOUNTER — Other Ambulatory Visit: Payer: Self-pay

## 2020-03-10 ENCOUNTER — Observation Stay (HOSPITAL_COMMUNITY): Payer: Medicare Other

## 2020-03-10 DIAGNOSIS — Z992 Dependence on renal dialysis: Secondary | ICD-10-CM

## 2020-03-10 DIAGNOSIS — J690 Pneumonitis due to inhalation of food and vomit: Secondary | ICD-10-CM | POA: Diagnosis not present

## 2020-03-10 DIAGNOSIS — Z8249 Family history of ischemic heart disease and other diseases of the circulatory system: Secondary | ICD-10-CM

## 2020-03-10 DIAGNOSIS — J189 Pneumonia, unspecified organism: Secondary | ICD-10-CM

## 2020-03-10 DIAGNOSIS — S5002XD Contusion of left elbow, subsequent encounter: Secondary | ICD-10-CM

## 2020-03-10 DIAGNOSIS — S065X9A Traumatic subdural hemorrhage with loss of consciousness of unspecified duration, initial encounter: Secondary | ICD-10-CM | POA: Diagnosis not present

## 2020-03-10 DIAGNOSIS — W19XXXD Unspecified fall, subsequent encounter: Secondary | ICD-10-CM | POA: Diagnosis present

## 2020-03-10 DIAGNOSIS — R404 Transient alteration of awareness: Secondary | ICD-10-CM | POA: Diagnosis not present

## 2020-03-10 DIAGNOSIS — F329 Major depressive disorder, single episode, unspecified: Secondary | ICD-10-CM | POA: Diagnosis not present

## 2020-03-10 DIAGNOSIS — J90 Pleural effusion, not elsewhere classified: Secondary | ICD-10-CM

## 2020-03-10 DIAGNOSIS — R0902 Hypoxemia: Secondary | ICD-10-CM | POA: Diagnosis not present

## 2020-03-10 DIAGNOSIS — F32A Depression, unspecified: Secondary | ICD-10-CM | POA: Diagnosis present

## 2020-03-10 DIAGNOSIS — R296 Repeated falls: Secondary | ICD-10-CM | POA: Diagnosis present

## 2020-03-10 DIAGNOSIS — D638 Anemia in other chronic diseases classified elsewhere: Secondary | ICD-10-CM | POA: Diagnosis present

## 2020-03-10 DIAGNOSIS — R4182 Altered mental status, unspecified: Secondary | ICD-10-CM | POA: Diagnosis not present

## 2020-03-10 DIAGNOSIS — W19XXXA Unspecified fall, initial encounter: Secondary | ICD-10-CM | POA: Diagnosis not present

## 2020-03-10 DIAGNOSIS — D62 Acute posthemorrhagic anemia: Secondary | ICD-10-CM | POA: Diagnosis present

## 2020-03-10 DIAGNOSIS — Z20822 Contact with and (suspected) exposure to covid-19: Secondary | ICD-10-CM | POA: Diagnosis not present

## 2020-03-10 DIAGNOSIS — Z833 Family history of diabetes mellitus: Secondary | ICD-10-CM

## 2020-03-10 DIAGNOSIS — N186 End stage renal disease: Secondary | ICD-10-CM | POA: Diagnosis not present

## 2020-03-10 DIAGNOSIS — Z87891 Personal history of nicotine dependence: Secondary | ICD-10-CM

## 2020-03-10 DIAGNOSIS — K5730 Diverticulosis of large intestine without perforation or abscess without bleeding: Secondary | ICD-10-CM | POA: Diagnosis present

## 2020-03-10 DIAGNOSIS — I1311 Hypertensive heart and chronic kidney disease without heart failure, with stage 5 chronic kidney disease, or end stage renal disease: Secondary | ICD-10-CM | POA: Diagnosis not present

## 2020-03-10 DIAGNOSIS — I10 Essential (primary) hypertension: Secondary | ICD-10-CM | POA: Diagnosis not present

## 2020-03-10 DIAGNOSIS — R58 Hemorrhage, not elsewhere classified: Secondary | ICD-10-CM | POA: Diagnosis not present

## 2020-03-10 DIAGNOSIS — I951 Orthostatic hypotension: Secondary | ICD-10-CM | POA: Diagnosis not present

## 2020-03-10 DIAGNOSIS — D631 Anemia in chronic kidney disease: Secondary | ICD-10-CM | POA: Diagnosis present

## 2020-03-10 DIAGNOSIS — E162 Hypoglycemia, unspecified: Secondary | ICD-10-CM | POA: Diagnosis not present

## 2020-03-10 DIAGNOSIS — R0602 Shortness of breath: Secondary | ICD-10-CM

## 2020-03-10 DIAGNOSIS — S7002XD Contusion of left hip, subsequent encounter: Secondary | ICD-10-CM

## 2020-03-10 DIAGNOSIS — D509 Iron deficiency anemia, unspecified: Secondary | ICD-10-CM | POA: Diagnosis present

## 2020-03-10 DIAGNOSIS — F0390 Unspecified dementia without behavioral disturbance: Secondary | ICD-10-CM | POA: Diagnosis present

## 2020-03-10 DIAGNOSIS — R627 Adult failure to thrive: Secondary | ICD-10-CM | POA: Diagnosis present

## 2020-03-10 DIAGNOSIS — Z96642 Presence of left artificial hip joint: Secondary | ICD-10-CM | POA: Diagnosis present

## 2020-03-10 DIAGNOSIS — Z03818 Encounter for observation for suspected exposure to other biological agents ruled out: Secondary | ICD-10-CM | POA: Diagnosis not present

## 2020-03-10 DIAGNOSIS — R1310 Dysphagia, unspecified: Secondary | ICD-10-CM | POA: Diagnosis not present

## 2020-03-10 DIAGNOSIS — G9341 Metabolic encephalopathy: Secondary | ICD-10-CM | POA: Diagnosis present

## 2020-03-10 DIAGNOSIS — N390 Urinary tract infection, site not specified: Secondary | ICD-10-CM | POA: Diagnosis not present

## 2020-03-10 DIAGNOSIS — Z6822 Body mass index (BMI) 22.0-22.9, adult: Secondary | ICD-10-CM

## 2020-03-10 DIAGNOSIS — S065XAA Traumatic subdural hemorrhage with loss of consciousness status unknown, initial encounter: Secondary | ICD-10-CM | POA: Diagnosis present

## 2020-03-10 DIAGNOSIS — Z8546 Personal history of malignant neoplasm of prostate: Secondary | ICD-10-CM

## 2020-03-10 DIAGNOSIS — E440 Moderate protein-calorie malnutrition: Secondary | ICD-10-CM | POA: Diagnosis present

## 2020-03-10 DIAGNOSIS — L89322 Pressure ulcer of left buttock, stage 2: Secondary | ICD-10-CM | POA: Diagnosis present

## 2020-03-10 DIAGNOSIS — M109 Gout, unspecified: Secondary | ICD-10-CM | POA: Diagnosis present

## 2020-03-10 DIAGNOSIS — Z8673 Personal history of transient ischemic attack (TIA), and cerebral infarction without residual deficits: Secondary | ICD-10-CM

## 2020-03-10 DIAGNOSIS — R918 Other nonspecific abnormal finding of lung field: Secondary | ICD-10-CM | POA: Diagnosis not present

## 2020-03-10 DIAGNOSIS — Z7982 Long term (current) use of aspirin: Secondary | ICD-10-CM

## 2020-03-10 DIAGNOSIS — N2581 Secondary hyperparathyroidism of renal origin: Secondary | ICD-10-CM | POA: Diagnosis present

## 2020-03-10 DIAGNOSIS — I6200 Nontraumatic subdural hemorrhage, unspecified: Secondary | ICD-10-CM | POA: Diagnosis not present

## 2020-03-10 DIAGNOSIS — S065X9D Traumatic subdural hemorrhage with loss of consciousness of unspecified duration, subsequent encounter: Secondary | ICD-10-CM

## 2020-03-10 DIAGNOSIS — E785 Hyperlipidemia, unspecified: Secondary | ICD-10-CM | POA: Diagnosis present

## 2020-03-10 DIAGNOSIS — Z9889 Other specified postprocedural states: Secondary | ICD-10-CM

## 2020-03-10 DIAGNOSIS — Z8042 Family history of malignant neoplasm of prostate: Secondary | ICD-10-CM

## 2020-03-10 LAB — CBC
HCT: 30.7 % — ABNORMAL LOW (ref 39.0–52.0)
Hemoglobin: 9.9 g/dL — ABNORMAL LOW (ref 13.0–17.0)
MCH: 31 pg (ref 26.0–34.0)
MCHC: 32.2 g/dL (ref 30.0–36.0)
MCV: 96.2 fL (ref 80.0–100.0)
Platelets: 281 10*3/uL (ref 150–400)
RBC: 3.19 MIL/uL — ABNORMAL LOW (ref 4.22–5.81)
RDW: 17 % — ABNORMAL HIGH (ref 11.5–15.5)
WBC: 11.3 10*3/uL — ABNORMAL HIGH (ref 4.0–10.5)
nRBC: 0 % (ref 0.0–0.2)

## 2020-03-10 LAB — RENAL FUNCTION PANEL
Albumin: 3.4 g/dL — ABNORMAL LOW (ref 3.5–5.0)
Anion gap: 14 (ref 5–15)
BUN: 31 mg/dL — ABNORMAL HIGH (ref 8–23)
CO2: 28 mmol/L (ref 22–32)
Calcium: 9.9 mg/dL (ref 8.9–10.3)
Chloride: 93 mmol/L — ABNORMAL LOW (ref 98–111)
Creatinine, Ser: 6.63 mg/dL — ABNORMAL HIGH (ref 0.61–1.24)
GFR calc Af Amer: 8 mL/min — ABNORMAL LOW (ref >60)
GFR calc non Af Amer: 7 mL/min — ABNORMAL LOW (ref >60)
Glucose, Bld: 95 mg/dL (ref 70–99)
Phosphorus: 2.8 mg/dL (ref 2.5–4.6)
Potassium: 4.4 mmol/L (ref 3.5–5.1)
Sodium: 135 mmol/L (ref 135–145)

## 2020-03-10 LAB — SARS CORONAVIRUS 2 (TAT 6-24 HRS): SARS Coronavirus 2: NEGATIVE

## 2020-03-10 LAB — CBG MONITORING, ED
Glucose-Capillary: 47 mg/dL — ABNORMAL LOW (ref 70–99)
Glucose-Capillary: 34 mg/dL — CL (ref 70–99)
Glucose-Capillary: 124 mg/dL — ABNORMAL HIGH (ref 70–99)
Glucose-Capillary: 90 mg/dL (ref 70–99)
Glucose-Capillary: 85 mg/dL (ref 70–99)
Glucose-Capillary: 111 mg/dL — ABNORMAL HIGH (ref 70–99)
Glucose-Capillary: 109 mg/dL — ABNORMAL HIGH (ref 70–99)

## 2020-03-10 LAB — FOLATE RBC
Folate, Hemolysate: 214 ng/mL
Folate, RBC: 995 ng/mL (ref >498)
Hematocrit: 21.5 % — ABNORMAL LOW (ref 37.5–51.0)

## 2020-03-10 MED ORDER — ALLOPURINOL 100 MG PO TABS
100.0000 mg | ORAL_TABLET | Freq: Every day | ORAL | Status: DC
  Administered 2020-03-10 – 2020-03-22 (×13): 100 mg via ORAL
  Filled 2020-03-10 (×13): qty 1

## 2020-03-10 MED ORDER — ADULT MULTIVITAMIN W/MINERALS CH
1.0000 | ORAL_TABLET | Freq: Every day | ORAL | Status: DC
  Administered 2020-03-10 – 2020-03-12 (×3): 1 via ORAL
  Filled 2020-03-10 (×3): qty 1

## 2020-03-10 MED ORDER — CINACALCET HCL 30 MG PO TABS
30.0000 mg | ORAL_TABLET | ORAL | Status: DC
  Administered 2020-03-15 – 2020-03-22 (×3): 30 mg via ORAL
  Filled 2020-03-10 (×2): qty 1

## 2020-03-10 MED ORDER — DEXTROSE 50 % IV SOLN
1.0000 | Freq: Once | INTRAVENOUS | Status: AC
  Administered 2020-03-10: 50 mL via INTRAVENOUS
  Filled 2020-03-10: qty 50

## 2020-03-10 MED ORDER — DARBEPOETIN ALFA 60 MCG/0.3ML IJ SOSY
60.0000 ug | PREFILLED_SYRINGE | INTRAMUSCULAR | Status: DC
  Administered 2020-03-11: 60 ug via INTRAVENOUS
  Filled 2020-03-10 (×2): qty 0.3

## 2020-03-10 MED ORDER — DEXTROSE 5 % IV SOLN: INTRAVENOUS | Status: DC

## 2020-03-10 MED ORDER — ROSUVASTATIN CALCIUM 20 MG PO TABS
20.0000 mg | ORAL_TABLET | Freq: Every day | ORAL | Status: DC
  Administered 2020-03-11 – 2020-03-21 (×11): 20 mg via ORAL
  Filled 2020-03-10 (×11): qty 1

## 2020-03-10 MED ORDER — CHLORHEXIDINE GLUCONATE CLOTH 2 % EX PADS
6.0000 | MEDICATED_PAD | Freq: Every day | CUTANEOUS | Status: DC
  Administered 2020-03-11: 6 via TOPICAL

## 2020-03-10 MED ORDER — CITALOPRAM HYDROBROMIDE 20 MG PO TABS
10.0000 mg | ORAL_TABLET | Freq: Every day | ORAL | Status: DC
  Administered 2020-03-10 – 2020-03-22 (×13): 10 mg via ORAL
  Filled 2020-03-10 (×13): qty 1

## 2020-03-10 MED ORDER — SODIUM CHLORIDE 0.9 % IV SOLN
2.0000 g | INTRAVENOUS | Status: DC
  Administered 2020-03-10 – 2020-03-12 (×3): 2 g via INTRAVENOUS
  Filled 2020-03-10 (×3): qty 20

## 2020-03-10 MED ORDER — ACETAMINOPHEN 650 MG RE SUPP: 650.0000 mg | Freq: Four times a day (QID) | RECTAL | Status: DC | PRN

## 2020-03-10 MED ORDER — CALCITRIOL 0.5 MCG PO CAPS
1.5000 ug | ORAL_CAPSULE | ORAL | Status: DC
  Administered 2020-03-12: 1.5 ug via ORAL
  Filled 2020-03-10: qty 3

## 2020-03-10 MED ORDER — LIDOCAINE-PRILOCAINE 2.5-2.5 % EX CREA
1.0000 "application " | TOPICAL_CREAM | Freq: Every day | CUTANEOUS | Status: DC | PRN
  Filled 2020-03-10: qty 5

## 2020-03-10 MED ORDER — BIOGAIA PROBIOTIC PO LIQD
5.0000 [drp] | Freq: Every day | ORAL | Status: DC
  Administered 2020-03-11 – 2020-03-21 (×11): 5 [drp] via ORAL
  Filled 2020-03-10: qty 5

## 2020-03-10 MED ORDER — FERRIC CITRATE 1 GM 210 MG(FE) PO TABS
420.0000 mg | ORAL_TABLET | Freq: Three times a day (TID) | ORAL | Status: DC
  Administered 2020-03-10 – 2020-03-11 (×3): 420 mg via ORAL
  Filled 2020-03-10 (×5): qty 2

## 2020-03-10 MED ORDER — NYSTATIN 100000 UNIT/GM EX POWD
1.0000 g | Freq: Two times a day (BID) | CUTANEOUS | Status: DC | PRN
  Administered 2020-03-19: 1 via TOPICAL
  Filled 2020-03-10: qty 15

## 2020-03-10 MED ORDER — ALBUTEROL SULFATE (2.5 MG/3ML) 0.083% IN NEBU: 2.5000 mg | INHALATION_SOLUTION | Freq: Four times a day (QID) | RESPIRATORY_TRACT | Status: DC | PRN

## 2020-03-10 MED ORDER — ACETAMINOPHEN 325 MG PO TABS: 650.0000 mg | ORAL_TABLET | Freq: Four times a day (QID) | ORAL | Status: DC | PRN

## 2020-03-10 MED ORDER — MIDODRINE HCL 5 MG PO TABS
10.0000 mg | ORAL_TABLET | Freq: Three times a day (TID) | ORAL | Status: DC
  Administered 2020-03-11 – 2020-03-22 (×32): 10 mg via ORAL
  Filled 2020-03-10 (×32): qty 2

## 2020-03-10 MED ORDER — LATANOPROST 0.005 % OP SOLN
1.0000 [drp] | Freq: Every day | OPHTHALMIC | Status: DC
  Administered 2020-03-11 – 2020-03-21 (×11): 1 [drp] via OPHTHALMIC
  Filled 2020-03-10: qty 2.5

## 2020-03-10 MED ORDER — AMOXICILLIN-POT CLAVULANATE 500-125 MG PO TABS
500.0000 mg | ORAL_TABLET | Freq: Every day | ORAL | Status: DC
  Filled 2020-03-10: qty 1

## 2020-03-10 NOTE — ED Provider Notes (Signed)
[place] EMERGENCY DEPARTMENT Provider Note  CSN: [id number] Arrival date & time: 03/10/20  1109     History Chief Complaint  Patient presents with  . Altered Mental Status    Gabriel Dorton Garcilazo Sr. is a 86 y.o. male w/ hx of dementia, anemia of chronic disease, iron defiency, ESRD on MWF dialysis, HLD, who was discharged from the hospital yesterday (03/09/20) after 3 day monitoring and management for subdural hematoma, presenting to the ED with AMS.  Per epic notes and discharge summary, the patient was discharged home with family yesterday evening after 3-day stay in the hospital for subdural bleed.  The bleed was felt to be stable by neurosurgery at the time of discharge.  Patient reported for dialysis today and there were concerns with dialysis that he was "confused" and oriented only to self.  Paramedics picked up and brought him to the ED.  On arrival the patient is pleasantly demented.  He reports he is not in pain.  He cannot provide any information to me but denies headache.  His hospital discharge also shows that he was discharged home on Augmentin to cover for possible aspiration pneumonia.  Blood cultures in the hospital were negative.  He was noted to be AAO x 2 in the hospital.  He was also noted to have recurrent episodes of hypoglycemia while in the hospital, reportedly resolved at the time of discharge.  I am unable to initially reach his family by phone to confer.  Patient is demented and cannot provide any additional history.  HPI     Past Medical History:  Diagnosis Date  . ANEMIA-IRON DEFICIENCY 03/09/2008  . Arthritis    cervical spine.   . Complex renal cyst 06/19/2011  . Depression 03/26/2014  . DIVERTICULOSIS, COLON 03/09/2008  . ESRD (end stage renal disease) ([place]) 03/09/2008  . Flu 12/08/2016  . GOUT 03/09/2008  . Hemorrhoids 08/2009   internal.   . History of blood transfusion   . HYPERLIPIDEMIA 03/09/2008  . Hyperparathyroidism ([place])   .  HYPERTENSION 03/09/2008  . Prostate cancer [facility]) 2002   Completed external beam radiation 2003.per HPI  . PROSTATE CANCER, HX OF 03/09/2008  . Radiation cystitis 2010.  Marland Kitchen Stroke [facility])     Patient Active Problem List   Diagnosis Date Noted  . Hypoglycemia 03/10/2020  . Falls 03/06/2020  . General weakness 03/06/2020  . Subdural hematoma ([place]) 03/06/2020  . Vitamin D deficiency 01/10/2020  . Benign neoplasm of scrotum 01/08/2020  . Bilateral hearing loss due to cerumen impaction 03/17/2019  . Irregular heart rhythm 10/10/2018  . Left flank pain 01/10/2018  . Influenza A 12/08/2016  . Elevated troponin 12/08/2016  . Anemia in chronic kidney disease (CKD) 10/04/2016  . Orthostatic hypotension 09/07/2016  . Mass of skin of finger of left hand 10/11/2015  . ESRD on dialysis ([place]) 09/09/2015  . Cerebrovascular accident (CVA) due to thrombosis of right middle cerebral artery ([place]) 09/09/2015  . History of GI bleed   . HLD (hyperlipidemia)   . Benign neoplasm of descending colon   . Gastritis   . Syncope 06/15/2015  . Diverticulosis of colon with hemorrhage   . Low back pain 03/26/2014  . Depression 03/26/2014  . Complex renal cyst 06/19/2011  . Preventative health care 06/17/2011  . SHOULDER PAIN, LEFT 06/03/2010  . Gout 03/09/2008  . Iron deficiency anemia 03/09/2008  . Essential hypertension 03/09/2008  . FATIGUE 03/09/2008  . PROSTATE CANCER, HX OF 03/09/2008  Past Surgical History:  Procedure Laterality Date  . AV FISTULA PLACEMENT Left 01/24/2013   Procedure: INSERTION OF ARTERIOVENOUS (AV) GORE-TEX GRAFT ARM;  Surgeon: Rosetta Posner, MD;  Location: [facility];  Service: Vascular;  Laterality: Left;  . COLONOSCOPY N/A 06/17/2015   Procedure: COLONOSCOPY;  Surgeon: Gatha Mayer, MD;  Location: [place];  Service: Endoscopy;  Laterality: N/A;  . ESOPHAGOGASTRODUODENOSCOPY N/A 06/17/2015   Procedure: ESOPHAGOGASTRODUODENOSCOPY (EGD);  Surgeon: Gatha Mayer, MD;  Location: [facility]  ENDOSCOPY;  Service: Endoscopy;  Laterality: N/A;  . FLEXIBLE SIGMOIDOSCOPY N/A 06/15/2015   Procedure: FLEXIBLE SIGMOIDOSCOPY;  Surgeon: Gatha Mayer, MD;  Location: [place];  Service: Endoscopy;  Laterality: N/A;  . GIVENS CAPSULE STUDY N/A 07/06/2015   Procedure: GIVENS CAPSULE STUDY;  Surgeon: Milus Banister, MD;  Location: [place];  Service: Endoscopy;  Laterality: N/A;  . JOINT REPLACEMENT     HIP  . rotator cuff repair right  4/08  . s/p left hip replacement  2007   Dr. Percell Miller ortho  . TONSILLECTOMY         Family History  Problem Relation Age of Onset  . Hypertension Father   . Diabetes Father   . Cancer Father        prostate cancer  . Hypertension Brother   . Cancer Brother     Social History   Tobacco Use  . Smoking status: Former Smoker    Years: 5.00    Types: Cigarettes    Quit date: 09/17/1982    Years since quitting: 37.5  . Smokeless tobacco: Never Used  Substance Use Topics  . Alcohol use: No    Alcohol/week: 0.0 standard drinks    Comment: rare  . Drug use: No    Home Medications Prior to Admission medications   Medication Sig Start Date End Date Taking? Authorizing Provider  allopurinol (ZYLOPRIM) 100 MG tablet TAKE 1 TABLET BY MOUTH EVERY DAY Patient taking differently: Take 100 mg by mouth daily.  03/03/20   [name], MD  amoxicillin-clavulanate (AUGMENTIN) 500-125 MG tablet Take 1 tablet (500 mg total) by mouth at bedtime for 5 days. 03/09/20 03/14/20  Damita Lack, MD  aspirin EC 81 MG tablet Take 1 tablet (81 mg total) by mouth daily. 12/12/16   Rosalin Hawking, MD  AURYXIA 1 GM 210 MG(Fe) tablet Take 420 mg by mouth 3 (three) times daily with meals.  04/30/18   [provider]  calcitRIOL (ROCALTROL) 0.5 MCG capsule Take 3 capsules (1.5 mcg total) by mouth every Monday, Wednesday, and Friday with hemodialysis. 03/10/20 04/09/20  Damita Lack, MD  cinacalcet (SENSIPAR) 30 MG tablet Take 1 tablet (30 mg total) by mouth  every Monday, Wednesday, and Friday with hemodialysis. 03/10/20 04/09/20  Amin, Jeanella Flattery, MD  citalopram (CELEXA) 10 MG tablet Take 1 tablet (10 mg total) by mouth daily. 01/12/20   [name], MD  Darbepoetin Alfa (ARANESP) 60 MCG/0.3ML SOSY injection Inject 0.3 mLs (60 mcg total) into the vein every Monday with hemodialysis. 03/15/20   Amin, Ankit Chirag, MD  latanoprost (XALATAN) 0.005 % ophthalmic solution Place 1 drop into both eyes at bedtime.  05/05/18   [provider]  lidocaine-prilocaine (EMLA) cream Apply 1 application topically See admin instructions. Apply to access site 1 hour before dialysis. Cover with saran wrap 06/07/18   [provider]  midodrine (PROAMATINE) 10 MG tablet Take 1 tablet (10 mg total) by mouth 3 (three) times daily. Take in  the morning, lunch time and 4pm. Do not take after 4pm. 12/15/19   [name], MD  Multiple Vitamin (MULTIVITAMIN WITH MINERALS) TABS tablet Take 1 tablet by mouth daily.    [provider]  nystatin (MYCOSTATIN/NYSTOP) powder Use as directed twice per day Patient taking differently: Apply 1 g topically 2 (two) times daily as needed (rash).  07/06/16   [name], MD  rosuvastatin (CRESTOR) 20 MG tablet Take 1 tablet (20 mg total) by mouth at bedtime. 12/15/19   [name], MD  Vitamin D, Ergocalciferol, (DRISDOL) 1.25 MG (50000 UNIT) CAPS capsule Take 1 capsule (50,000 Units total) by mouth every 7 (seven) days. 12/04/19   [name], MD    Allergies    Patient has no known allergies.  Review of Systems   Review of Systems  Unable to perform ROS: Dementia (level 5 caveat)    Physical Exam Updated Vital Signs BP (!) 152/80   Pulse 69   Temp 98.3 F (36.8 C) (Oral)   Resp 18   Ht 5\' 9"  (1.753 m)   Wt 82.6 kg   SpO2 98%   BMI 26.89 kg/m   Physical Exam Vitals and nursing note reviewed.  Constitutional:      Appearance: He is well-developed.  HENT:     Head: Normocephalic.     Nose: Nose normal.      Mouth/Throat:     Mouth: Mucous membranes are moist.  Eyes:     Conjunctiva/sclera: Conjunctivae normal.  Cardiovascular:     Rate and Rhythm: Normal rate and regular rhythm.     Pulses: Normal pulses.  Pulmonary:     Effort: Pulmonary effort is normal. No respiratory distress.  Abdominal:     Palpations: Abdomen is soft.     Tenderness: There is no abdominal tenderness.  Musculoskeletal:     Cervical back: Neck supple.  Skin:    General: Skin is warm and dry.  Neurological:     Mental Status: He is alert.     ED Results / Procedures / Treatments   Labs (all labs ordered are listed, but only abnormal results are displayed) Labs Reviewed  RENAL FUNCTION PANEL - Abnormal; Notable for the following components:      Result Value   Chloride 93 (*)    BUN 31 (*)    Creatinine, Ser 6.63 (*)    Albumin 3.4 (*)    GFR calc non Af Amer 7 (*)    GFR calc Af Amer 8 (*)    All other components within normal limits  CBC - Abnormal; Notable for the following components:   WBC 11.3 (*)    RBC 3.19 (*)    Hemoglobin 9.9 (*)    HCT 30.7 (*)    RDW 17.0 (*)    All other components within normal limits  CBG MONITORING, ED - Abnormal; Notable for the following components:   Glucose-Capillary 47 (*)    All other components within normal limits  CBG MONITORING, ED - Abnormal; Notable for the following components:   Glucose-Capillary 34 (*)    All other components within normal limits  CBG MONITORING, ED - Abnormal; Notable for the following components:   Glucose-Capillary 124 (*)    All other components within normal limits  SARS CORONAVIRUS 2 (TAT 6-24 HRS)  URINE CULTURE  CBC  RENAL FUNCTION PANEL  URINALYSIS, ROUTINE W REFLEX MICROSCOPIC  C-REACTIVE PROTEIN  CBG MONITORING, ED  CBG MONITORING, ED  CBG  MONITORING, ED  CBG MONITORING, ED    EKG EKG Interpretation  Date/Time:  Wednesday Mar 10 2020 11:23:41 EDT Ventricular Rate:  88 PR Interval:    QRS  Duration: 102 QT Interval:  399 QTC Calculation: 483 R Axis:   -60 Text Interpretation: Sinus rhythm Atrial premature complex Probable left atrial enlargement Inferior infarct, old Anterior infarct, old No STEMI Confirmed by Octaviano Glow [id number]) on 03/10/2020 11:41:54 AM   Radiology CT Head Wo Contrast  Result Date: 03/10/2020 CLINICAL DATA:  Evaluate right subdural hematoma EXAM: CT HEAD WITHOUT CONTRAST TECHNIQUE: Contiguous axial images were obtained from the base of the skull through the vertex without intravenous contrast. COMPARISON:  03/06/2020 FINDINGS: Technical note: Examination degraded by motion artifact. Brain: Redemonstration of a slightly hyperdense extra-axial collection overlying the right cerebral convexity, which appears minimally decreased in size compared to previous exam. Collection measures up to 8 mm in maximal thickness (series 10, image 25), previously measured 10 mm when remeasured at a similar location. No significant shift of the midline structures. No evidence of new large territory infarction. No hydrocephalus. Extensive low-density changes within the periventricular and subcortical white matter compatible with chronic microvascular ischemic change. Moderate diffuse cerebral volume loss. Vascular: Atherosclerotic calcifications involving the large vessels of the skull base. No unexpected hyperdense vessel. Skull: Negative for calvarial fracture. Sinuses/Orbits: Paranasal sinuses are clear. Chronic soft tissue thickening and erosion of the external auditory canal. Other: None. IMPRESSION: 1. Redemonstration of a right-sided subdural hematoma, which appears minimally decreased in size compared to previous exam. No significant shift of the midline structures. 2. Extensive low-density changes within the periventricular and subcortical white matter compatible with chronic microvascular ischemic change. 3. Moderate diffuse cerebral volume loss. 4. Soft tissue thickening with bony  erosion at the right external auditory canal, similar in appearance to temporal bone CT of 08/19/2019. Electronically Signed   By: Davina Poke D.O.   On: 03/10/2020 13:19   DG CHEST PORT 1 VIEW  Result Date: 03/10/2020 CLINICAL DATA:  Altered mental status. EXAM: PORTABLE CHEST 1 VIEW COMPARISON:  Mar 06, 2020. FINDINGS: Stable cardiomegaly. Right internal jugular dialysis catheter is unchanged in position. No pneumothorax is noted. Mild bibasilar opacities are noted concerning for atelectasis or edema with associated pleural effusions. Bony thorax is unremarkable. Atherosclerosis of thoracic aorta is noted. IMPRESSION: Mild bibasilar opacities are noted concerning for atelectasis or edema with associated pleural effusions. Aortic Atherosclerosis (ICD10-I70.0). Electronically Signed   By: Marijo Conception M.D.   On: 03/10/2020 18:17    Procedures .Critical Care Performed by: Wyvonnia Dusky, MD Authorized by: Wyvonnia Dusky, MD   Critical care provider statement:    Critical care time (minutes):  35   Critical care was necessary to treat or prevent imminent or life-threatening deterioration of the following conditions:  Metabolic crisis   Critical care was time spent personally by me on the following activities:  Discussions with consultants, evaluation of patient's response to treatment, examination of patient, ordering and performing treatments and interventions, ordering and review of laboratory studies, ordering and review of radiographic studies, pulse oximetry, re-evaluation of patient's condition, obtaining history from patient or surrogate and review of old charts Comments:     Symptomatic hypoglycemia requiring IV dextrose   (including critical care time)  Medications Ordered in ED Medications  multivitamin with minerals tablet 1 tablet (1 tablet Oral Given 03/10/20 1744)  latanoprost (XALATAN) 0.005 % ophthalmic solution 1 drop (has no administration in time range)  lidocaine-prilocaine (EMLA) cream 1 application (has no administration in time range)  nystatin (MYCOSTATIN/NYSTOP) topical powder 1 application (has no administration in time range)  ferric citrate (AURYXIA) tablet 420 mg (420 mg Oral Given 03/10/20 1744)  calcitRIOL (ROCALTROL) capsule 1.5 mcg (has no administration in time range)  cinacalcet (SENSIPAR) tablet 30 mg (has no administration in time range)  allopurinol (ZYLOPRIM) tablet 100 mg (100 mg Oral Given 03/10/20 1744)  midodrine (PROAMATINE) tablet 10 mg (5 mg Oral Not Given 03/10/20 1745)  rosuvastatin (CRESTOR) tablet 20 mg (has no administration in time range)  citalopram (CELEXA) tablet 10 mg (10 mg Oral Given 03/10/20 1749)  acetaminophen (TYLENOL) tablet 650 mg (has no administration in time range)    Or  acetaminophen (TYLENOL) suppository 650 mg (has no administration in time range)  dextrose 5 % solution ( Intravenous New Bag/Given 03/10/20 1558)  albuterol (PROVENTIL) (2.5 MG/3ML) 0.083% nebulizer solution 2.5 mg (has no administration in time range)  cefTRIAXone (ROCEPHIN) 2 g in sodium chloride 0.9 % 100 mL IVPB (has no administration in time range)  lactobacillus rheuteri (BIOGAIA/GERBER SOOTHE) probiotic liquid (has no administration in time range)  dextrose 50 % solution 50 mL (50 mLs Intravenous Given 03/10/20 1314)    ED Course  I have reviewed the triage vital signs and the nursing notes.  Pertinent labs & imaging results that were available during my care of the patient were reviewed by me and considered in my medical decision making (see chart for details).  86 yo male presenting with possible worsening confusion since his discharge from the hospital yesterday  It's not clear what his baseline mental status is since his discharge.  He is noted to have dementia and AAO x 2 per the chart.  Here his presentation is similar.  He has a pleasant demeanor and does not appear to be in distress.  He has a hx of subdural bleed  monitored in the hospital.  [facility] here shows minimal improvement of his bleed, but no evidence of worsening bleed or herniation.  I suspect this is stable.  While in the ED he had persistent hypoglycemia, with BS in the 40's on arrival, and after eating and drinking, his BS had dropped to the 30's.  He was given IV dextrose with improvement.  It's not clear what the culprit is here - he is not on hypoglycemic agents or insulin at baseline.  BP is stable, and his electrolytes are not suggestive of adrenal insufficiency here.  Doubtful of infection at the moment.  Does not appear septic  Hgb is stable here from his discharge.  Doubtful of symptomatic anemia  Clinical Course as of Mar 11 1819  Wed Mar 10, 2020  1226 Glucose-Capillary(!): 47 [MT]  1226 Advised nurse to give food and drink.  He has had issues with hypogylcemia while in the hospital   [MT]  1309 Continues to be hypogylcemic despite eating crackers and drinking juice.  BG now 34.  Advised nurse to give 1 amp d50, continue checking q 30 min   [MT]  1324  IMPRESSION: 1. Redemonstration of a right-sided subdural hematoma, which appears minimally decreased in size compared to previous exam. No significant shift of the midline structures. 2. Extensive low-density changes within the periventricular and subcortical white matter compatible with chronic microvascular ischemic change. 3. Moderate diffuse cerebral volume loss. 4. Soft tissue thickening with bony erosion at the right external auditory canal, similar in appearance to temporal bone CT of 08/19/2019.   [MT]  1438 Admitted to hospitalist for hypoglycemia   [MT]    Clinical Course User Index [MT] Diania Co, Carola Rhine, MD    Final Clinical Impression(s) / ED Diagnoses Final diagnoses:  Hypoglycemia    Rx / DC Orders ED Discharge Orders    None       Christien Frankl, Carola Rhine, MD 03/10/20 [facility]

## 2020-03-10 NOTE — H&P (Signed)
History and Physical    Gabriel Partin Whitmyer Sr. YIR:[id number] DOB: [birth year] DOA: 03/10/2020  Referring MD/NP/PA: Octaviano Glow, MD PCP: [name], MD  Patient coming from: Dialysis via  EMS  Chief Complaint: Confusion  I have personally briefly reviewed patient's old medical records in [place]   HPI: Gabriel Yerger Belote Sr. is a 86 y.o. male with medical history significant of ESRD on HD(M/W/F) anemia of chronic disease, HLD, orthostatic hypotension, depression, and gout presented to the hospital with confusion.  He had apparently been at dialysis and was noted to be acutely altered and sent into the hospital for further evaluation.  Son notes that at baseline he was fairly with it and only had intermittent issues with memory.  However, over the last 3 weeks he has noticed a significant decline in that he has been more confused.  He felt that his dad should not have been discharged from the hospital when he was because he was not back to his baseline.  Patient had just recently been hospitalized from 5/1-5/4 after presenting with multiple falls found to have a right sided subdural hematoma without midline shift or mass-effect.  He had been evaluated by neurosurgery, but deemed not to be a surgical candidate.  Patient also found to have acute on chronic iron deficiency anemia with with a left hip hematoma noted to be 5.8 x 3.7 cm acute drop in hemoglobin down to 7 requiring 2 units of packed red blood cells.  During his hospitalization it appears he had episodes of hypoglycemia which were thought possibly secondary to urinary tract infection and/or aspiration pneumonia.  He had swallow evaluation switched recommended aspiration precautions and continue on a regular diet.  Review of records note blood cultures and urine cultures were negative during his last hospitalization.      ED Course: Upon admission into the emergency department patient was noted to be afebrile, blood pressure elevated  up to 175/94, and O2 saturation maintained on room air.  Labs significant for WBC 11.3, hemoglobin 9.9, BUN 31, and creatinine 6.63.  While in the ED blood sugars dropped as low as 34.  Patient was given an amp of D50.  TRH called to admit.  CT scan and redemonstrated a right subdural hematoma which appeared to be minimally decreased in size  Review of Systems  Unable to perform ROS: Dementia    Past Medical History:  Diagnosis Date  . ANEMIA-IRON DEFICIENCY 03/09/2008  . Arthritis    cervical spine.   . Complex renal cyst 06/19/2011  . Depression 03/26/2014  . DIVERTICULOSIS, COLON 03/09/2008  . ESRD (end stage renal disease) ([place]) 03/09/2008  . Flu 12/08/2016  . GOUT 03/09/2008  . Hemorrhoids 08/2009   internal.   . History of blood transfusion   . HYPERLIPIDEMIA 03/09/2008  . Hyperparathyroidism ([place])   . HYPERTENSION 03/09/2008  . Prostate cancer [facility]) 2002   Completed external beam radiation 2003.per HPI  . PROSTATE CANCER, HX OF 03/09/2008  . Radiation cystitis 2010.  Marland Kitchen Stroke [facility])     Past Surgical History:  Procedure Laterality Date  . AV FISTULA PLACEMENT Left 01/24/2013   Procedure: INSERTION OF ARTERIOVENOUS (AV) GORE-TEX GRAFT ARM;  Surgeon: Rosetta Posner, MD;  Location: [place];  Service: Vascular;  Laterality: Left;  . COLONOSCOPY N/A 06/17/2015   Procedure: COLONOSCOPY;  Surgeon: Gatha Mayer, MD;  Location: [place];  Service: Endoscopy;  Laterality: N/A;  . ESOPHAGOGASTRODUODENOSCOPY N/A 06/17/2015   Procedure: ESOPHAGOGASTRODUODENOSCOPY (EGD);  Surgeon: Gatha Mayer, MD;  Location[facility] ENDOSCOPY;  Service: Endoscopy;  Laterality: N/A;  . FLEXIBLE SIGMOIDOSCOPY N/A 06/15/2015   Procedure: FLEXIBLE SIGMOIDOSCOPY;  Surgeon: Gatha Mayer, MD;  Location: [place];  Service: Endoscopy;  Laterality: N/A;  . GIVENS CAPSULE STUDY N/A 07/06/2015   Procedure: GIVENS CAPSULE STUDY;  Surgeon: Milus Banister, MD;  Location: [place];  Service: Endoscopy;  Laterality: N/A;  .  JOINT REPLACEMENT     HIP  . rotator cuff repair right  4/08  . s/p left hip replacement  2007   Dr. Percell Miller ortho  . TONSILLECTOMY       reports that he quit smoking about 37 years ago. His smoking use included cigarettes. He quit after 5.00 years of use. He has never used smokeless tobacco. He reports that he does not drink alcohol or use drugs.  No Known Allergies  Family History  Problem Relation Age of Onset  . Hypertension Father   . Diabetes Father   . Cancer Father        prostate cancer  . Hypertension Brother   . Cancer Brother     Prior to Admission medications   Medication Sig Start Date End Date Taking? Authorizing Provider  allopurinol (ZYLOPRIM) 100 MG tablet TAKE 1 TABLET BY MOUTH EVERY DAY Patient taking differently: Take 100 mg by mouth daily.  03/03/20   [name], MD  amoxicillin-clavulanate (AUGMENTIN) 500-125 MG tablet Take 1 tablet (500 mg total) by mouth at bedtime for 5 days. 03/09/20 03/14/20  Damita Lack, MD  aspirin EC 81 MG tablet Take 1 tablet (81 mg total) by mouth daily. 12/12/16   Rosalin Hawking, MD  AURYXIA 1 GM 210 MG(Fe) tablet Take 420 mg by mouth 3 (three) times daily with meals.  04/30/18   [provider]  calcitRIOL (ROCALTROL) 0.5 MCG capsule Take 3 capsules (1.5 mcg total) by mouth every Monday, Wednesday, and Friday with hemodialysis. 03/10/20 04/09/20  Damita Lack, MD  cinacalcet (SENSIPAR) 30 MG tablet Take 1 tablet (30 mg total) by mouth every Monday, Wednesday, and Friday with hemodialysis. 03/10/20 04/09/20  Amin, Jeanella Flattery, MD  citalopram (CELEXA) 10 MG tablet Take 1 tablet (10 mg total) by mouth daily. 01/12/20   [name], MD  Darbepoetin Alfa (ARANESP) 60 MCG/0.3ML SOSY injection Inject 0.3 mLs (60 mcg total) into the vein every Monday with hemodialysis. 03/15/20   Amin, Ankit Chirag, MD  latanoprost (XALATAN) 0.005 % ophthalmic solution Place 1 drop into both eyes at bedtime.  05/05/18   [provider]    lidocaine-prilocaine (EMLA) cream Apply 1 application topically See admin instructions. Apply to access site 1 hour before dialysis. Cover with saran wrap 06/07/18   [provider]  midodrine (PROAMATINE) 10 MG tablet Take 1 tablet (10 mg total) by mouth 3 (three) times daily. Take in the morning, lunch time and 4pm. Do not take after 4pm. 12/15/19   [name], MD  Multiple Vitamin (MULTIVITAMIN WITH MINERALS) TABS tablet Take 1 tablet by mouth daily.    [provider]  nystatin (MYCOSTATIN/NYSTOP) powder Use as directed twice per day Patient taking differently: Apply 1 g topically 2 (two) times daily as needed (rash).  07/06/16   [name], MD  rosuvastatin (CRESTOR) 20 MG tablet Take 1 tablet (20 mg total) by mouth at bedtime. 12/15/19   [name], MD  Vitamin D, Ergocalciferol, (DRISDOL) 1.25 MG (50000 UNIT) CAPS capsule Take 1  capsule (50,000 Units total) by mouth every 7 (seven) days. 12/04/19   [name], MD    Physical Exam:  Constitutional: Elderly male that appears to be in no acute distress at this time Vitals:   03/10/20 1132 03/10/20 1133 03/10/20 1223 03/10/20 1416  BP: (!) 165/95  (!) 175/94 (!) 150/105  Pulse: 85  (!) 57   Resp: (!) 22  18 (!) 23  Temp: 98.3 F (36.8 C)     TempSrc: Oral     SpO2: 96%  95%   Weight:  82.6 kg    Height:  5\' 9"  (1.753 m)     Eyes: PERRL, lids and conjunctivae normal ENMT: Mucous membranes are dry. Posterior pharynx clear of any exudate or lesions.  Neck: normal, supple, no masses, no thyromegaly Respiratory: Bibasilar crackles appreciated patient mildly tachypneic.  O2 saturations currently maintained on room air at this time. Cardiovascular: Regular rate and rhythm, no murmurs / rubs / gallops. No extremity edema. 2+ pedal pulses. No carotid bruits.  Abdomen: no tenderness, no masses palpated. No hepatosplenomegaly. Bowel sounds positive.  Musculoskeletal: no clubbing / cyanosis. No joint deformity upper and  lower extremities. Good ROM, no contractures. Normal muscle tone.  Skin: no rashes, lesions, ulcers. No induration Neurologic: CN 2-12 grossly intact. Sensation intact, DTR normal. Strength 5/5 in all 4.  Psychiatric: Alert and oriented to person only.  Not place and time.    Labs on Admission: I have personally reviewed following labs and imaging studies  CBC: Recent Labs  Lab 03/06/20 1229 03/07/20 0358 03/08/20 0303 03/09/20 0403 03/10/20 1204  WBC 11.1* 10.8* 13.4* 12.6* 11.3*  HGB 7.0* 7.8* 7.3* 8.9* 9.9*  HCT 22.6* 24.5* 22.3* 26.4* 30.7*  MCV 100.9* 95.7 94.9 94.0 96.2  PLT 232 206 219 225 407   Basic Metabolic Panel: Recent Labs  Lab 03/06/20 1229 03/06/20 1229 03/07/20 0358 03/08/20 0303 03/08/20 1303 03/09/20 0403 03/10/20 1204  NA 137  --  138 136  --  134* 135  K 3.7  --  3.7 3.9  --  3.9 4.4  CL 90*  --  95* 93*  --  92* 93*  CO2 30  --  29 31  --  28 28  GLUCOSE 115*   < > 101* 96 134* 101* 95  BUN 17  --  23 33*  --  17 31*  CREATININE 4.98*  --  5.77* 7.37*  --  4.57* 6.63*  CALCIUM 9.5  --  9.3 9.4  --  9.1 9.9  MG  --   --  2.1 2.1  --  2.0  --   PHOS  --   --   --  3.2  --   --  2.8   < > = values in this interval not displayed.   GFR: Estimated Creatinine Clearance: 7.7 mL/min (A) (by C-G formula based on SCr of 6.63 mg/dL (H)). Liver Function Tests: Recent Labs  Lab 03/06/20 1437 03/07/20 0358 03/08/20 0303 03/09/20 0403 03/10/20 1204  AST 27 27 25 26   --   ALT 13 13 12 13   --   ALKPHOS 58 57 56 54  --   BILITOT 1.2 1.5* 1.4* 1.4*  --   PROT 6.3* 6.0* 5.8* 5.8*  --   ALBUMIN 3.4* 3.3* 3.2* 3.0* 3.4*   No results for input(s): LIPASE, AMYLASE in the last 168 hours. Recent Labs  Lab 03/06/20 1437  AMMONIA 18   Coagulation Profile: No results for  input(s): INR, PROTIME in the last 168 hours. Cardiac Enzymes: Recent Labs  Lab 03/06/20 1745  CKTOTAL 181   BNP (last 3 results) No results for input(s): PROBNP in the last 8760  hours. HbA1C: No results for input(s): HGBA1C in the last 72 hours. CBG: Recent Labs  Lab 03/09/20 1011 03/09/20 1218 03/10/20 1210 03/10/20 1307 03/10/20 1345  GLUCAP 90 98 47* 34* 124*   Lipid Profile: No results for input(s): CHOL, HDL, LDLCALC, TRIG, CHOLHDL, LDLDIRECT in the last 72 hours. Thyroid Function Tests: No results for input(s): TSH, T4TOTAL, FREET4, T3FREE, THYROIDAB in the last 72 hours. Anemia Panel: No results for input(s): VITAMINB12, FOLATE, FERRITIN, TIBC, IRON, RETICCTPCT in the last 72 hours. Urine analysis:    Component Value Date/Time   COLORURINE YELLOW 03/07/2020 1128   APPEARANCEUR HAZY (A) 03/07/2020 1128   LABSPEC 1.008 03/07/2020 1128   PHURINE 9.0 (H) 03/07/2020 1128   GLUCOSEU NEGATIVE 03/07/2020 1128   GLUCOSEU NEGATIVE 01/30/2020 1518   HGBUR SMALL (A) 03/07/2020 1128   BILIRUBINUR NEGATIVE 03/07/2020 1128   KETONESUR NEGATIVE 03/07/2020 1128   PROTEINUR 100 (A) 03/07/2020 1128   UROBILINOGEN 0.2 01/30/2020 1518   NITRITE NEGATIVE 03/07/2020 1128   LEUKOCYTESUR LARGE (A) 03/07/2020 1128   Sepsis Labs: Recent Results (from the past 240 hour(s))  Urine culture     Status: None   Collection Time: 03/06/20  1:11 PM   Specimen: Urine, Random  Result Value Ref Range Status   Specimen Description URINE, RANDOM  Final   Special Requests NONE  Final   Culture   Final    NO GROWTH Performed at [facility], [place] [address], [place], [place] [address]    Report Status 03/08/2020 FINAL  Final  Blood culture (routine x 2)     Status: None (Preliminary result)   Collection Time: 03/06/20  2:37 PM   Specimen: BLOOD RIGHT FOREARM  Result Value Ref Range Status   Specimen Description BLOOD RIGHT FOREARM  Final   Special Requests   Final    BOTTLES DRAWN AEROBIC AND ANAEROBIC Blood Culture results may not be optimal due to an inadequate volume of blood received in culture bottles   Culture   Final    NO GROWTH 4 DAYS Performed at [facility], [place] [address], [place], [place] [address]    Report Status PENDING  Incomplete  Blood culture (routine x 2)     Status: None (Preliminary result)   Collection Time: 03/06/20  3:15 PM   Specimen: BLOOD  Result Value Ref Range Status   Specimen Description BLOOD RIGHT ANTECUBITAL  Final   Special Requests   Final    BOTTLES DRAWN AEROBIC AND ANAEROBIC Blood Culture results may not be optimal due to an inadequate volume of blood received in culture bottles   Culture   Final    NO GROWTH 4 DAYS Performed at [facility], [place] [address], [place], [place] [address]    Report Status PENDING  Incomplete  Respiratory Panel by RT PCR (Flu A&B, Covid) - Nasopharyngeal Swab     Status: None   Collection Time: 03/06/20 11:15 PM   Specimen: Nasopharyngeal Swab  Result Value Ref Range Status   SARS Coronavirus 2 by RT PCR NEGATIVE NEGATIVE Final    Comment: (NOTE) SARS-CoV-2 target nucleic acids are NOT DETECTED. The SARS-CoV-2 RNA is generally detectable in upper respiratoy specimens during the acute phase of infection. The lowest concentration of SARS-CoV-2 viral  copies this assay can detect is 131 copies/mL. A negative result does not preclude SARS-Cov-2 infection and should not be used as the sole basis for treatment or other patient management decisions. A negative result may occur with  improper specimen collection/handling, submission of specimen other than nasopharyngeal swab, presence of viral mutation(s) within the areas targeted by this assay, and inadequate number of viral copies (<131 copies/mL). A negative result must be combined with clinical observations, patient history, and epidemiological information. The expected result is Negative. Fact Sheet for Patients:  PinkCheek.be Fact Sheet for Healthcare Providers:  GravelBags.it This test is not yet ap proved or cleared by the [place] FDA and  has  been authorized for detection and/or diagnosis of SARS-CoV-2 by FDA under an Emergency Use Authorization (EUA). This EUA will remain  in effect (meaning this test can be used) for the duration of the COVID-19 declaration under Section 564(b)(1) of the Act, 21 U.S.C. section 360bbb-3(b)(1), unless the authorization is terminated or revoked sooner.    Influenza A by PCR NEGATIVE NEGATIVE Final   Influenza B by PCR NEGATIVE NEGATIVE Final    Comment: (NOTE) The Xpert Xpress SARS-CoV-2/FLU/RSV assay is intended as an aid in  the diagnosis of influenza from Nasopharyngeal swab specimens and  should not be used as a sole basis for treatment. Nasal washings and  aspirates are unacceptable for Xpert Xpress SARS-CoV-2/FLU/RSV  testing. Fact Sheet for Patients: PinkCheek.be Fact Sheet for Healthcare Providers: GravelBags.it This test is not yet approved or cleared by the [place] FDA and  has been authorized for detection and/or diagnosis of SARS-CoV-2 by  FDA under an Emergency Use Authorization (EUA). This EUA will remain  in effect (meaning this test can be used) for the duration of the  Covid-19 declaration under Section 564(b)(1) of the Act, 21  U.S.C. section 360bbb-3(b)(1), unless the authorization is  terminated or revoked. Performed at [facility], [place] [address] [place], [place] [address]      Radiological Exams on Admission: CT Head Wo Contrast  Result Date: 03/10/2020 CLINICAL DATA:  Evaluate right subdural hematoma EXAM: CT HEAD WITHOUT CONTRAST TECHNIQUE: Contiguous axial images were obtained from the base of the skull through the vertex without intravenous contrast. COMPARISON:  03/06/2020 FINDINGS: Technical note: Examination degraded by motion artifact. Brain: Redemonstration of a slightly hyperdense extra-axial collection overlying the right cerebral convexity, which appears minimally decreased in size  compared to previous exam. Collection measures up to 8 mm in maximal thickness (series 10, image 25), previously measured 10 mm when remeasured at a similar location. No significant shift of the midline structures. No evidence of new large territory infarction. No hydrocephalus. Extensive low-density changes within the periventricular and subcortical white matter compatible with chronic microvascular ischemic change. Moderate diffuse cerebral volume loss. Vascular: Atherosclerotic calcifications involving the large vessels of the skull base. No unexpected hyperdense vessel. Skull: Negative for calvarial fracture. Sinuses/Orbits: Paranasal sinuses are clear. Chronic soft tissue thickening and erosion of the external auditory canal. Other: None. IMPRESSION: 1. Redemonstration of a right-sided subdural hematoma, which appears minimally decreased in size compared to previous exam. No significant shift of the midline structures. 2. Extensive low-density changes within the periventricular and subcortical white matter compatible with chronic microvascular ischemic change. 3. Moderate diffuse cerebral volume loss. 4. Soft tissue thickening with bony erosion at the right external auditory canal, similar in appearance to temporal bone CT of 08/19/2019. Electronically Signed   By: Davina Poke D.O.   On:  03/10/2020 13:19    EKG: Independently reviewed.  Sinus rhythm at 88 bpm with premature atrial complexes  Assessment/Plan Hypoglycemia: Acute.  Patient presented and was noted to have blood sugars as low as 34.  He was given an amp of D50 with improvement of blood sugars temporarily.  Not on any oral hypoglycemic agents or insulins.  Patient has no history of diabetes.  -Admit to a medical telemetry bed -Hypoglycemic protocol -CBGs every 4 hours  -D5 W IV fluids at 50 mL/h  -Consider need of further studies episodes of hypoglycemia continue to occur  Acute metabolic encephalopathy: Son reports at baseline  patient had been normally alert and oriented x3 with only mild intermittent issues with memory.  Question the possibility of underlying infection. -Neuro checks  Leukocytosis: Acute.  WBC still elevated at 11.3 but appears to be trending down.  Urine and blood cultures were noted to be negative.  Patient had been sent home to complete a 5-day course of Augmentin. -Check CRP -Recheck chest x-ray  Recent UTI/pneumonia: During his previous hospitalization poorly treated for possible pneumonia and urinary tract infection.  Patient had been discharged home to complete a 5-day course of Augmentin. -Follow-up repeat urinalysis and urine culture -Empiric antibiotics of Rocephin IV  ESRD on HD: Patient had been at dialysis today, but had not received treatment due to symptoms.  Labs noted potassium 4.4, BUN 31, creatinine 6.63. -Continue current medication regimen -Nephrology consulted and will likely be dialyzed on 5/6  Subdural hematoma/left hip hematoma: Patient  noted to have a subdural hematoma as well as left hip hematoma on last admission.  Repeat CT imaging of the brain performed today showed subdural hematoma being minimally decreased in size.  Blood counts appear to be stable.  Anemia of chronic disease: Hemoglobin 9.9 on admission which appears improved from previous discharge of 8.9 on 5/4. -Continue  Orthostatic hypotension -Continue midodrine  Depression -Continue Celexa   DVT prophylaxis: SCD Code Status: Full Family Communication: Discussed plan of care with the patient's son present at bedside Disposition Plan: To be determined Consults called: non  Admission status: observation   Norval Morton MD [facility] Pager [phone]   If 7PM-7AM, please contact night-coverage [email or web address] Password [email or web address]  03/10/2020, 2:26 PM

## 2020-03-10 NOTE — ED Notes (Signed)
MD Trifan made aware of CBG 47

## 2020-03-10 NOTE — ED Notes (Signed)
The pt wants the lights off then back on again

## 2020-03-10 NOTE — ED Notes (Signed)
DR Langston Masker notified of CBG 34

## 2020-03-10 NOTE — ED Notes (Signed)
PT received 8 oz apple juice and several bites of a [place] sandwich and apple sauce.

## 2020-03-10 NOTE — ED Notes (Signed)
Pt transported to CT ?

## 2020-03-10 NOTE — ED Notes (Signed)
Tele Dinner ordered 

## 2020-03-10 NOTE — ED Triage Notes (Signed)
PT BIB [facility] from dialysis related to c/o AMS. Report given to [facility] upon arrival was pt has hx of subarachnoid bleed x 1 week requiring 2 units of blood. PT was alert to only self at dialysis facility. Upon arrival to hospital PT is A & O to self and place.

## 2020-03-10 NOTE — Progress Notes (Signed)
I was informed that patient has been admitted to the hospital again for confusion following hospitalization from 5/1-5/4.    His last dialysis was here Monday 5/3-  He is due today  There are no indications for HD this evening.  Will plan on HD tomorrow off schedule- orders written.    His OP HD orders are as follows [facility]-  MWF  4 hours, 400 BFR via TDC  EDW 66-  2 K, 2 calc bath.  No heparin  Calcitriol 1.5 mcg and sensipar 30 q HD, mircera 30  Currently admitted under observation.  Full formal consult will be done tomorrow if changed to inpatient status  Louis Meckel

## 2020-03-11 DIAGNOSIS — M109 Gout, unspecified: Secondary | ICD-10-CM | POA: Diagnosis present

## 2020-03-11 DIAGNOSIS — I120 Hypertensive chronic kidney disease with stage 5 chronic kidney disease or end stage renal disease: Secondary | ICD-10-CM | POA: Diagnosis not present

## 2020-03-11 DIAGNOSIS — D3502 Benign neoplasm of left adrenal gland: Secondary | ICD-10-CM | POA: Diagnosis not present

## 2020-03-11 DIAGNOSIS — E440 Moderate protein-calorie malnutrition: Secondary | ICD-10-CM | POA: Diagnosis present

## 2020-03-11 DIAGNOSIS — R0602 Shortness of breath: Secondary | ICD-10-CM | POA: Diagnosis not present

## 2020-03-11 DIAGNOSIS — M6281 Muscle weakness (generalized): Secondary | ICD-10-CM | POA: Diagnosis not present

## 2020-03-11 DIAGNOSIS — F329 Major depressive disorder, single episode, unspecified: Secondary | ICD-10-CM | POA: Diagnosis present

## 2020-03-11 DIAGNOSIS — R1310 Dysphagia, unspecified: Secondary | ICD-10-CM | POA: Diagnosis not present

## 2020-03-11 DIAGNOSIS — J948 Other specified pleural conditions: Secondary | ICD-10-CM | POA: Diagnosis not present

## 2020-03-11 DIAGNOSIS — J690 Pneumonitis due to inhalation of food and vomit: Secondary | ICD-10-CM | POA: Diagnosis present

## 2020-03-11 DIAGNOSIS — L89322 Pressure ulcer of left buttock, stage 2: Secondary | ICD-10-CM | POA: Diagnosis present

## 2020-03-11 DIAGNOSIS — R262 Difficulty in walking, not elsewhere classified: Secondary | ICD-10-CM | POA: Diagnosis not present

## 2020-03-11 DIAGNOSIS — Z992 Dependence on renal dialysis: Secondary | ICD-10-CM | POA: Diagnosis not present

## 2020-03-11 DIAGNOSIS — N250 Renal osteodystrophy: Secondary | ICD-10-CM | POA: Diagnosis not present

## 2020-03-11 DIAGNOSIS — I1311 Hypertensive heart and chronic kidney disease without heart failure, with stage 5 chronic kidney disease, or end stage renal disease: Secondary | ICD-10-CM | POA: Diagnosis present

## 2020-03-11 DIAGNOSIS — N2581 Secondary hyperparathyroidism of renal origin: Secondary | ICD-10-CM | POA: Diagnosis present

## 2020-03-11 DIAGNOSIS — D62 Acute posthemorrhagic anemia: Secondary | ICD-10-CM | POA: Diagnosis present

## 2020-03-11 DIAGNOSIS — N281 Cyst of kidney, acquired: Secondary | ICD-10-CM | POA: Diagnosis not present

## 2020-03-11 DIAGNOSIS — D638 Anemia in other chronic diseases classified elsewhere: Secondary | ICD-10-CM

## 2020-03-11 DIAGNOSIS — R4182 Altered mental status, unspecified: Secondary | ICD-10-CM | POA: Diagnosis not present

## 2020-03-11 DIAGNOSIS — Z7401 Bed confinement status: Secondary | ICD-10-CM | POA: Diagnosis not present

## 2020-03-11 DIAGNOSIS — R531 Weakness: Secondary | ICD-10-CM | POA: Diagnosis not present

## 2020-03-11 DIAGNOSIS — S5002XD Contusion of left elbow, subsequent encounter: Secondary | ICD-10-CM | POA: Diagnosis not present

## 2020-03-11 DIAGNOSIS — R278 Other lack of coordination: Secondary | ICD-10-CM | POA: Diagnosis not present

## 2020-03-11 DIAGNOSIS — W19XXXD Unspecified fall, subsequent encounter: Secondary | ICD-10-CM | POA: Diagnosis present

## 2020-03-11 DIAGNOSIS — F0390 Unspecified dementia without behavioral disturbance: Secondary | ICD-10-CM | POA: Diagnosis present

## 2020-03-11 DIAGNOSIS — R1312 Dysphagia, oropharyngeal phase: Secondary | ICD-10-CM | POA: Diagnosis not present

## 2020-03-11 DIAGNOSIS — G9341 Metabolic encephalopathy: Secondary | ICD-10-CM | POA: Diagnosis present

## 2020-03-11 DIAGNOSIS — Z20822 Contact with and (suspected) exposure to covid-19: Secondary | ICD-10-CM | POA: Diagnosis present

## 2020-03-11 DIAGNOSIS — R41841 Cognitive communication deficit: Secondary | ICD-10-CM | POA: Diagnosis not present

## 2020-03-11 DIAGNOSIS — I951 Orthostatic hypotension: Secondary | ICD-10-CM | POA: Diagnosis present

## 2020-03-11 DIAGNOSIS — R2689 Other abnormalities of gait and mobility: Secondary | ICD-10-CM | POA: Diagnosis not present

## 2020-03-11 DIAGNOSIS — N390 Urinary tract infection, site not specified: Secondary | ICD-10-CM | POA: Diagnosis present

## 2020-03-11 DIAGNOSIS — K5730 Diverticulosis of large intestine without perforation or abscess without bleeding: Secondary | ICD-10-CM | POA: Diagnosis present

## 2020-03-11 DIAGNOSIS — D631 Anemia in chronic kidney disease: Secondary | ICD-10-CM | POA: Diagnosis present

## 2020-03-11 DIAGNOSIS — R627 Adult failure to thrive: Secondary | ICD-10-CM | POA: Diagnosis present

## 2020-03-11 DIAGNOSIS — M2550 Pain in unspecified joint: Secondary | ICD-10-CM | POA: Diagnosis not present

## 2020-03-11 DIAGNOSIS — E785 Hyperlipidemia, unspecified: Secondary | ICD-10-CM | POA: Diagnosis present

## 2020-03-11 DIAGNOSIS — I10 Essential (primary) hypertension: Secondary | ICD-10-CM | POA: Diagnosis not present

## 2020-03-11 DIAGNOSIS — E162 Hypoglycemia, unspecified: Secondary | ICD-10-CM | POA: Diagnosis present

## 2020-03-11 DIAGNOSIS — J90 Pleural effusion, not elsewhere classified: Secondary | ICD-10-CM | POA: Diagnosis not present

## 2020-03-11 DIAGNOSIS — J9811 Atelectasis: Secondary | ICD-10-CM | POA: Diagnosis not present

## 2020-03-11 DIAGNOSIS — R2681 Unsteadiness on feet: Secondary | ICD-10-CM | POA: Diagnosis not present

## 2020-03-11 DIAGNOSIS — N186 End stage renal disease: Secondary | ICD-10-CM | POA: Diagnosis present

## 2020-03-11 DIAGNOSIS — Z6822 Body mass index (BMI) 22.0-22.9, adult: Secondary | ICD-10-CM | POA: Diagnosis not present

## 2020-03-11 LAB — CBC
HCT: 29.3 % — ABNORMAL LOW (ref 39.0–52.0)
Hemoglobin: 9.4 g/dL — ABNORMAL LOW (ref 13.0–17.0)
MCH: 30.8 pg (ref 26.0–34.0)
MCHC: 32.1 g/dL (ref 30.0–36.0)
MCV: 96.1 fL (ref 80.0–100.0)
Platelets: 265 10*3/uL (ref 150–400)
RBC: 3.05 MIL/uL — ABNORMAL LOW (ref 4.22–5.81)
RDW: 16.9 % — ABNORMAL HIGH (ref 11.5–15.5)
WBC: 10.9 10*3/uL — ABNORMAL HIGH (ref 4.0–10.5)
nRBC: 0 % (ref 0.0–0.2)

## 2020-03-11 LAB — RENAL FUNCTION PANEL
Albumin: 3.1 g/dL — ABNORMAL LOW (ref 3.5–5.0)
Anion gap: 15 (ref 5–15)
BUN: 34 mg/dL — ABNORMAL HIGH (ref 8–23)
CO2: 28 mmol/L (ref 22–32)
Calcium: 9.5 mg/dL (ref 8.9–10.3)
Chloride: 88 mmol/L — ABNORMAL LOW (ref 98–111)
Creatinine, Ser: 7.38 mg/dL — ABNORMAL HIGH (ref 0.61–1.24)
GFR calc Af Amer: 7 mL/min — ABNORMAL LOW (ref >60)
GFR calc non Af Amer: 6 mL/min — ABNORMAL LOW (ref >60)
Glucose, Bld: 90 mg/dL (ref 70–99)
Phosphorus: 2.9 mg/dL (ref 2.5–4.6)
Potassium: 4.2 mmol/L (ref 3.5–5.1)
Sodium: 131 mmol/L — ABNORMAL LOW (ref 135–145)

## 2020-03-11 LAB — GLUCOSE, CAPILLARY
Glucose-Capillary: 52 mg/dL — ABNORMAL LOW (ref 70–99)
Glucose-Capillary: 68 mg/dL — ABNORMAL LOW (ref 70–99)
Glucose-Capillary: 133 mg/dL — ABNORMAL HIGH (ref 70–99)
Glucose-Capillary: 51 mg/dL — ABNORMAL LOW (ref 70–99)
Glucose-Capillary: 87 mg/dL (ref 70–99)
Glucose-Capillary: 112 mg/dL — ABNORMAL HIGH (ref 70–99)
Glucose-Capillary: 69 mg/dL — ABNORMAL LOW (ref 70–99)
Glucose-Capillary: 85 mg/dL (ref 70–99)
Glucose-Capillary: 85 mg/dL (ref 70–99)

## 2020-03-11 LAB — CULTURE, BLOOD (ROUTINE X 2)
Culture: NO GROWTH
Culture: NO GROWTH

## 2020-03-11 LAB — CBG MONITORING, ED: Glucose-Capillary: 90 mg/dL (ref 70–99)

## 2020-03-11 LAB — C-REACTIVE PROTEIN: CRP: 11.1 mg/dL — ABNORMAL HIGH (ref <1.0)

## 2020-03-11 LAB — MRSA PCR SCREENING: MRSA by PCR: NEGATIVE

## 2020-03-11 MED ORDER — MIDODRINE HCL 5 MG PO TABS
ORAL_TABLET | ORAL | Status: AC
  Filled 2020-03-11: qty 2

## 2020-03-11 MED ORDER — HEPARIN SODIUM (PORCINE) 1000 UNIT/ML IJ SOLN
INTRAMUSCULAR | Status: AC
  Administered 2020-03-11: 3800 [IU] via INTRAVENOUS_CENTRAL
  Filled 2020-03-11: qty 4

## 2020-03-11 MED ORDER — DEXTROSE 50 % IV SOLN
INTRAVENOUS | Status: AC
  Administered 2020-03-11: 50 mL
  Filled 2020-03-11: qty 50

## 2020-03-11 MED ORDER — HEPARIN SODIUM (PORCINE) 1000 UNIT/ML DIALYSIS: 1000.0000 [IU] | INTRAMUSCULAR | Status: DC | PRN

## 2020-03-11 MED ORDER — SODIUM CHLORIDE 0.9 % IV SOLN: 100.0000 mL | INTRAVENOUS | Status: DC | PRN

## 2020-03-11 MED ORDER — LIDOCAINE HCL (PF) 1 % IJ SOLN: 5.0000 mL | INTRAMUSCULAR | Status: DC | PRN

## 2020-03-11 MED ORDER — PENTAFLUOROPROP-TETRAFLUOROETH EX AERO: 1.0000 "application " | INHALATION_SPRAY | CUTANEOUS | Status: DC | PRN

## 2020-03-11 MED ORDER — GLUCOSE 40 % PO GEL
1.0000 | ORAL | Status: AC
  Administered 2020-03-11: 37.5 g via ORAL
  Filled 2020-03-11: qty 1

## 2020-03-11 MED ORDER — DEXTROSE 50 % IV SOLN: 25.0000 g | INTRAVENOUS | Status: AC

## 2020-03-11 MED ORDER — LIDOCAINE-PRILOCAINE 2.5-2.5 % EX CREA: 1.0000 "application " | TOPICAL_CREAM | CUTANEOUS | Status: DC | PRN

## 2020-03-11 MED ORDER — DARBEPOETIN ALFA 60 MCG/0.3ML IJ SOSY
PREFILLED_SYRINGE | INTRAMUSCULAR | Status: AC
  Filled 2020-03-11: qty 0.3

## 2020-03-11 MED ORDER — ALTEPLASE 2 MG IJ SOLR: 2.0000 mg | Freq: Once | INTRAMUSCULAR | Status: DC | PRN

## 2020-03-11 MED ORDER — CHLORHEXIDINE GLUCONATE CLOTH 2 % EX PADS
6.0000 | MEDICATED_PAD | Freq: Every day | CUTANEOUS | Status: DC
  Administered 2020-03-12 – 2020-03-21 (×8): 6 via TOPICAL

## 2020-03-11 NOTE — ED Notes (Signed)
Not keepiong cover on  And he gates cold

## 2020-03-11 NOTE — Progress Notes (Signed)
Hypoglycemic Event  CBG: 51  Treatment: D50 50 mL (25 gm)  Symptoms: None  Follow-up CBG: [id number] CBG Result:133  Possible Reasons for Event: Unknown  Comments/MD notified:per hypoglycemia protocol    Zavannah Deblois Joselita

## 2020-03-11 NOTE — Progress Notes (Signed)
New Admission Note:   Arrival Method: Arrived from ED via stretcher Mental Orientation: Alert and oriented to person Telemetry: Box #9 Assessment: Completed Skin: See dic flowsheet IV: Rt FA-D5W at 50 cc/hr infusing Pain: 0/10 Tubes: N/A Safety Measures: Safety Fall Prevention Plan has been discussed.  Admission: Completed 5MW Orientation: Patient has been orientated to the room, unit and staff.  Family: None at bedside  Orders have been reviewed and implemented. Will continue to monitor the patient. Call light has been placed within reach and bed alarm has been activated.   Ebenezer Mccaskey [employer], RN-BC Phone number: [phone]

## 2020-03-11 NOTE — ED Notes (Signed)
Pt mumbling to himslef

## 2020-03-11 NOTE — Progress Notes (Signed)
[facility] Progress Note   Subjective:   Recent admission from 5/1-5/4 for worsened confusion/weakness, UTI, possible aspiration PNA, as well as subacute subdural hematoma and L hip hematoma following fall.    Sent from dialysis 1 day post discharge for increased confusion.   Pertinent findings during this admission include hypoglycemia, CXR w/mild bibsailar opacities concerning for edema and pleural effusions and CT head with slightly decreased size of subdural.   Patient seen and examined in room with family at bedside.  Reports "I feel just fine."  Denies complaints. Family reports confusion comes and goes.  Objective Vitals:   03/11/20 1100 03/11/20 1125 03/11/20 1126 03/11/20 1145  BP: (!) 148/93 (!) 144/88 (!) 141/87   Pulse: 87 82 84   Resp:   17   Temp:   97.8 F (36.6 C)   TempSrc:   Oral   SpO2:   98%   Weight:    70.3 kg  Height:       Physical Exam General:NAD, chronically ill appearing elderly male Heart:regular rate, irregular rhythm Lungs:mostly CTAB Abdomen:soft, NTND Extremities:no LE edema, bruising on L hip/thigh, +tenderness to palpation of L hip; improvement in LUE swelling and bruising Dialysis Access: TDC, LU AVG no b/t   Filed Weights   03/10/20 1133 03/11/20 0700 03/11/20 1145  Weight: 82.6 kg 82.6 kg 70.3 kg    Intake/Output Summary (Last 24 hours) at 03/11/2020 1520 Last data filed at 03/11/2020 1145 Gross per 24 hour  Intake 454.91 ml  Output 3503 ml  Net -3048.09 ml    Additional Objective Labs: Basic Metabolic Panel: Recent Labs  Lab 03/08/20 0303 03/08/20 1303 03/09/20 0403 03/10/20 1204 03/11/20 0500  NA 136  --  134* 135 131*  K 3.9  --  3.9 4.4 4.2  CL 93*  --  92* 93* 88*  CO2 31  --  28 28 28   GLUCOSE 96   < > 101* 95 90  BUN 33*  --  17 31* 34*  CREATININE 7.37*  --  4.57* 6.63* 7.38*  CALCIUM 9.4  --  9.1 9.9 9.5  PHOS 3.2  --   --  2.8 2.9   < > = values in this interval not displayed.   Liver  Function Tests: Recent Labs  Lab 03/07/20 0358 03/07/20 0358 03/08/20 0303 03/08/20 0303 03/09/20 0403 03/10/20 1204 03/11/20 0500  AST 27  --  25  --  26  --   --   ALT 13  --  12  --  13  --   --   ALKPHOS 57  --  56  --  54  --   --   BILITOT 1.5*  --  1.4*  --  1.4*  --   --   PROT 6.0*  --  5.8*  --  5.8*  --   --   ALBUMIN 3.3*   < > 3.2*   < > 3.0* 3.4* 3.1*   < > = values in this interval not displayed.   CBC: Recent Labs  Lab 03/07/20 0358 03/07/20 0358 03/08/20 0303 03/08/20 0303 03/09/20 0403 03/10/20 1204 03/11/20 0500  WBC 10.8*   < > 13.4*   < > 12.6* 11.3* 10.9*  HGB 7.8*   < > 7.3*   < > 8.9* 9.9* 9.4*  HCT 24.5*   < > 22.3*   < > 26.4* 30.7* 29.3*  MCV 95.7  --  94.9  --  94.0 96.2 96.1  PLT  206   < > 219   < > 225 281 265   < > = values in this interval not displayed.   Blood Culture    Component Value Date/Time   SDES BLOOD RIGHT ANTECUBITAL 03/06/2020 1515   SPECREQUEST  03/06/2020 1515    BOTTLES DRAWN AEROBIC AND ANAEROBIC Blood Culture results may not be optimal due to an inadequate volume of blood received in culture bottles   CULT  03/06/2020 1515    NO GROWTH 5 DAYS Performed at [facility], [place] [address], [place],  [address]    REPTSTATUS 03/11/2020 FINAL 03/06/2020 1515    Cardiac Enzymes: Recent Labs  Lab 03/06/20 1745  CKTOTAL 181   CBG: Recent Labs  Lab 03/11/20 0342 03/11/20 0418 03/11/20 1003 03/11/20 1234 03/11/20 1258  GLUCAP 51* 133* 87 69* 112*   Iron Studies: No results for input(s): IRON, TIBC, TRANSFERRIN, FERRITIN in the last 72 hours. Lab Results  Component Value Date   INR 1.1 11/27/2019   INR 1.24 10/05/2016   INR 1.36 12/06/2015   Studies/Results: CT Head Wo Contrast  Result Date: 03/10/2020 CLINICAL DATA:  Evaluate right subdural hematoma EXAM: CT HEAD WITHOUT CONTRAST TECHNIQUE: Contiguous axial images were obtained from the base of the skull through the vertex without intravenous  contrast. COMPARISON:  03/06/2020 FINDINGS: Technical note: Examination degraded by motion artifact. Brain: Redemonstration of a slightly hyperdense extra-axial collection overlying the right cerebral convexity, which appears minimally decreased in size compared to previous exam. Collection measures up to 8 mm in maximal thickness (series 10, image 25), previously measured 10 mm when remeasured at a similar location. No significant shift of the midline structures. No evidence of new large territory infarction. No hydrocephalus. Extensive low-density changes within the periventricular and subcortical white matter compatible with chronic microvascular ischemic change. Moderate diffuse cerebral volume loss. Vascular: Atherosclerotic calcifications involving the large vessels of the skull base. No unexpected hyperdense vessel. Skull: Negative for calvarial fracture. Sinuses/Orbits: Paranasal sinuses are clear. Chronic soft tissue thickening and erosion of the external auditory canal. Other: None. IMPRESSION: 1. Redemonstration of a right-sided subdural hematoma, which appears minimally decreased in size compared to previous exam. No significant shift of the midline structures. 2. Extensive low-density changes within the periventricular and subcortical white matter compatible with chronic microvascular ischemic change. 3. Moderate diffuse cerebral volume loss. 4. Soft tissue thickening with bony erosion at the right external auditory canal, similar in appearance to temporal bone CT of 08/19/2019. Electronically Signed   By: Davina Poke D.O.   On: 03/10/2020 13:19   DG CHEST PORT 1 VIEW  Result Date: 03/10/2020 CLINICAL DATA:  Altered mental status. EXAM: PORTABLE CHEST 1 VIEW COMPARISON:  Mar 06, 2020. FINDINGS: Stable cardiomegaly. Right internal jugular dialysis catheter is unchanged in position. No pneumothorax is noted. Mild bibasilar opacities are noted concerning for atelectasis or edema with associated  pleural effusions. Bony thorax is unremarkable. Atherosclerosis of thoracic aorta is noted. IMPRESSION: Mild bibasilar opacities are noted concerning for atelectasis or edema with associated pleural effusions. Aortic Atherosclerosis (ICD10-I70.0). Electronically Signed   By: Marijo Conception M.D.   On: 03/10/2020 18:17    Medications: . cefTRIAXone (ROCEPHIN)  IV Stopped (03/10/20 1858)  . dextrose 50 mL/hr at 03/11/20 1251   . allopurinol  100 mg Oral Daily  . [START ON 03/12/2020] calcitRIOL  1.5 mcg Oral Q M,W,F-HD  . Chlorhexidine Gluconate Cloth  6 each Topical Q0600  . [START ON 03/12/2020] cinacalcet  30 mg Oral Q M,W,F-HD  . citalopram  10 mg Oral Daily  . darbepoetin (ARANESP) injection - DIALYSIS  60 mcg Intravenous Q Thu-HD  . ferric citrate  420 mg Oral TID WC  . BioGaia Probiotic  5 drop Oral Q2000  . latanoprost  1 drop Both Eyes QHS  . midodrine  10 mg Oral TID  . multivitamin with minerals  1 tablet Oral Daily  . rosuvastatin  20 mg Oral QHS    Dialysis Orders: [facility]-  MWF  4 hours, 400 BFR via TDC  EDW 66-  2 K, 2 calc bath.  No heparin  Calcitriol 1.5 mcg and sensipar 30 q HD, mircera 30  Assessment/Plan: 1. Confusion - waxing and waning, baseline dementia, hypoglycemic on arrival, +UTI, recent SDH. Rocephin given.  2. Hypoglycemia - per primary  3. ESRD -on HD MWF.  HD today off schedule due to missed treatment yesterday.  Orders written for HD tomorrow per regular schedule. No Heparin 4. Anemia of CKD- Hgb 9.4.  ESA dosed on 5/3 5. Secondary hyperparathyroidism - Ca and phos at goal.  Continue sensipar, VDRA and binders 6. HTN/volume - Blood pressure mildly elevated.  Does not appear volume overloaded. Titrate down volume as tolerated.  Request standing or hoyer weight to better assess edw, difference of 12kg recorded earlier today.  7. Nutrition - Renal diet w/fluid restrictions.   Jen Mow, PA-C [facility] Pager:  [phone] 03/11/2020,3:20 PM  LOS: 0 days

## 2020-03-11 NOTE — Progress Notes (Signed)
PT Cancellation Note  Patient Details Name: Gabriel Halls [place] Sr. MRN: [id number] DOB: 1929-03-30   Cancelled Treatment:    Reason Eval/Treat Not Completed: Patient at procedure or test/unavailable Pt currently in HD. Will follow up as schedule allows.   Lou Miner, DPT  Acute Rehabilitation Services  Pager: [phone] Office: [phone]    Rudean Hitt 03/11/2020, 10:32 AM

## 2020-03-11 NOTE — ED Notes (Signed)
Confusion.  Words spoke is not in comversation with questions asked

## 2020-03-11 NOTE — ED Notes (Signed)
Report  Given to rn on 5w

## 2020-03-11 NOTE — Consult Note (Signed)
   [facility] CM Inpatient Consult   03/11/2020  Gabriel Romer Graeff Sr. 01/09/1929 [id number]   [facility] ACO Patient:  Medicare NextGen  1 day readmission  Patient screened for high risk score for unplanned readmission and for re- hospitalizations. Chart reivewed  to check for [employer] service needs and barriers of care.  Review of patient's medical record from History and physical which includes but not limited to that reveals the patient is admitted with hypoglycemia [patient with no history of diabetes] and acute metabolic encephalopathy per Dr. Fuller Plan HPI 03/10/2020 notes.  Primary Care Provider is [name], MD of [facility], this provided is listed to the transition of care follow up.  Plan:  Follow up with inpatient [facility] team for barriers and potential needs and awating PT/OT evaluation as well.  Continue to follow progress and disposition to assess for post hospital care management needs.    Please place a [facility] Care Management consult as appropriate and for questions contact:   Natividad Brood, RN BSN [facility] Liaison  [phone] business mobile phone Toll free office [phone]  Fax number: [phone] [email or web address]

## 2020-03-11 NOTE — Progress Notes (Signed)
Hypoglycemic Event  CBG: 68  Treatment: 4 oz juice/soda  Symptoms: None  Follow-up CBG: Time:0315 CBG Result:52  Possible Reasons for Event: Unknown  Comments/MD notified:per hypoglycemia protocol    [name] [name]

## 2020-03-11 NOTE — Progress Notes (Addendum)
PROGRESS NOTE        PATIENT DETAILS Name: Gabriel Garringer Donalson Sr. Age: 86 y.o. Sex: male Date of Birth: 1929/07/23 Admit Date: 03/10/2020 Admitting Physician Evalee Mutton Kristeen Mans, MD V[facility], Hunt Oris, MD  Brief Narrative: Patient is a 86 y.o. male with history of ESRD on HD MWF, HLD, anemia of chronic disease, orthostatic hypotension, depression who was found to be confused at HD and sent to the hospital for further evaluation.  See below for further details.  Significant events: 5/5>> admit to [facility] for evaluation of encephalopathy/hypoglycemia. 5/1-5/4>> admit to [facility]-frequent falls-found to have SDH, left hip hematoma, acute blood loss anemia requiring 2 units of PRBC  Antimicrobial therapy: Rocephin: 5/5>>  Microbiology data: None  Procedures : None  Consults: Nephrology  DVT Prophylaxis : SCD's  Subjective: Pleasantly confused-numerous hypoglycemic episode overnight.  Assessment/Plan: Hypoglycemia: Etiology uncertain-not sure if he has family members with diabetes and may have inadvertently ingested medications.  Spoke to wife on the phone-she is really not much of help in providing further history, have left message for patient's son.  For now-continue D5 infusion and supportive care-follow CBGs-if hypoglycemia persists-we can obtain further work-up.  Acute metabolic encephalopathy: Still pleasantly confused-suspect he probably has some amount of dementia at baseline-from H&P-patient is not at baseline.  Suspect etiology due to a combination of hypoglycemia, recent SDH, ongoing UTI.  CT head on admission-showed decreased size of SDH.  Continue supportive care-with underlying causes-follow-if mentation does not improve-we will commence further work-up.  Complicated UTI: Thought to have UTI last admission-and discharged on Augmentin-continue Rocephin for a few more days.  ESRD on HD MWF: Nephrology following and directing care  Recent SDH: Following a  mechanical fall (last admission)-repeat CT head this admission shows decreased size of SDH.  Recent left hip hematoma: Following a mechanical fall (last admission)-supportive care.  Hemoglobin currently stable-Did require PRBC transfusion last admission.  Anemia: Related to ESRD-hemoglobin stable with no need for transfusion.  Orthostatic hypotension: Continue midodrine-follow.  Depression: Continue Celexa  History of dementia: Stable-although has dementia-current mental status not at baseline.  Gout: No flare-continue allopurinol  Diet: Diet Order            Diet renal with fluid restriction Fluid restriction: 1200 mL Fluid; Room service appropriate? Yes; Fluid consistency: Thin  Diet effective now               Code Status: Full code  Family Communication: Spouse over the [place] voicemail for son  Disposition Plan: Status is: Inpatient  Remains inpatient appropriate because:Altered mental status   Dispo: The patient is from: Home              Anticipated d/c is to: Home              Anticipated d/c date is: 2 days              Patient currently is not medically stable to d/c.   Barriers to Discharge: Hypoglycemia/ongoing encephalopathy   Antimicrobial agents: Anti-infectives (From admission, onward)   Start     Dose/Rate Route Frequency Ordered Stop   03/10/20 2200  amoxicillin-clavulanate (AUGMENTIN) 500-125 MG per tablet 500 mg  Status:  Discontinued     500 mg Oral Daily at bedtime 03/10/20 1500 03/10/20 1752   03/10/20 1800  cefTRIAXone (ROCEPHIN) 2 g in sodium chloride 0.9 %  100 mL IVPB     2 g 200 mL/hr over 30 Minutes Intravenous Every 24 hours 03/10/20 1752         Time spent: 25 minutes-Greater than 50% of this time was spent in counseling, explanation of diagnosis, planning of further management, and coordination of care.  MEDICATIONS: Scheduled Meds: . allopurinol  100 mg Oral Daily  . [START ON 03/12/2020] calcitRIOL  1.5 mcg Oral Q  M,W,F-HD  . Chlorhexidine Gluconate Cloth  6 each Topical Q0600  . [START ON 03/12/2020] cinacalcet  30 mg Oral Q M,W,F-HD  . citalopram  10 mg Oral Daily  . darbepoetin (ARANESP) injection - DIALYSIS  60 mcg Intravenous Q Thu-HD  . ferric citrate  420 mg Oral TID WC  . BioGaia Probiotic  5 drop Oral Q2000  . latanoprost  1 drop Both Eyes QHS  . midodrine      . midodrine  10 mg Oral TID  . multivitamin with minerals  1 tablet Oral Daily  . rosuvastatin  20 mg Oral QHS   Continuous Infusions: . sodium chloride    . sodium chloride    . cefTRIAXone (ROCEPHIN)  IV Stopped (03/10/20 1858)  . dextrose 50 mL/hr at 03/10/20 1558   PRN Meds:.sodium chloride, sodium chloride, acetaminophen **OR** acetaminophen, albuterol, alteplase, heparin, lidocaine (PF), lidocaine-prilocaine, lidocaine-prilocaine, nystatin, pentafluoroprop-tetrafluoroeth   PHYSICAL EXAM: Vital signs: Vitals:   03/11/20 0800 03/11/20 0830 03/11/20 0900 03/11/20 0930  BP: (!) 104/53 (!) 104/47 138/67 (!) (P) 158/60  Pulse: (!) 58 82 89 (P) 91  Resp:      Temp:      TempSrc:      SpO2:      Weight:      Height:       Filed Weights   03/10/20 1133 03/11/20 0700  Weight: 82.6 kg 82.6 kg   Body mass index is 26.89 kg/m.   Gen Exam: Confused-pleasantly HEENT:atraumatic, normocephalic Chest: B/L clear to auscultation anteriorly CVS:S1S2 regular Abdomen:soft non tender, non distended Extremities:no edema Neurology: Moves all 4 extremities- Skin: no rash  I have personally reviewed following labs and imaging studies  LABORATORY DATA: CBC: Recent Labs  Lab 03/07/20 0358 03/08/20 0303 03/09/20 0403 03/10/20 1204 03/11/20 0500  WBC 10.8* 13.4* 12.6* 11.3* 10.9*  HGB 7.8* 7.3* 8.9* 9.9* 9.4*  HCT 24.5* 22.3* 26.4* 30.7* 29.3*  MCV 95.7 94.9 94.0 96.2 96.1  PLT 206 219 225 281 229    Basic Metabolic Panel: Recent Labs  Lab 03/07/20 0358 03/07/20 0358 03/08/20 0303 03/08/20 1303 03/09/20 0403  03/10/20 1204 03/11/20 0500  NA 138  --  136  --  134* 135 131*  K 3.7  --  3.9  --  3.9 4.4 4.2  CL 95*  --  93*  --  92* 93* 88*  CO2 29  --  31  --  28 28 28   GLUCOSE 101*   < > 96 134* 101* 95 90  BUN 23  --  33*  --  17 31* 34*  CREATININE 5.77*  --  7.37*  --  4.57* 6.63* 7.38*  CALCIUM 9.3  --  9.4  --  9.1 9.9 9.5  MG 2.1  --  2.1  --  2.0  --   --   PHOS  --   --  3.2  --   --  2.8 2.9   < > = values in this interval not displayed.    GFR: Estimated Creatinine Clearance: 6.9  mL/min (A) (by C-G formula based on SCr of 7.38 mg/dL (H)).  Liver Function Tests: Recent Labs  Lab 03/06/20 1437 03/06/20 1437 03/07/20 0358 03/08/20 0303 03/09/20 0403 03/10/20 1204 03/11/20 0500  AST 27  --  27 25 26   --   --   ALT 13  --  13 12 13   --   --   ALKPHOS 58  --  57 56 54  --   --   BILITOT 1.2  --  1.5* 1.4* 1.4*  --   --   PROT 6.3*  --  6.0* 5.8* 5.8*  --   --   ALBUMIN 3.4*   < > 3.3* 3.2* 3.0* 3.4* 3.1*   < > = values in this interval not displayed.   No results for input(s): LIPASE, AMYLASE in the last 168 hours. Recent Labs  Lab 03/06/20 1437  AMMONIA 18    Coagulation Profile: No results for input(s): INR, PROTIME in the last 168 hours.  Cardiac Enzymes: Recent Labs  Lab 03/06/20 1745  CKTOTAL 181    BNP (last 3 results) No results for input(s): PROBNP in the last 8760 hours.  Lipid Profile: No results for input(s): CHOL, HDL, LDLCALC, TRIG, CHOLHDL, LDLDIRECT in the last 72 hours.  Thyroid Function Tests: No results for input(s): TSH, T4TOTAL, FREET4, T3FREE, THYROIDAB in the last 72 hours.  Anemia Panel: No results for input(s): VITAMINB12, FOLATE, FERRITIN, TIBC, IRON, RETICCTPCT in the last 72 hours.  Urine analysis:    Component Value Date/Time   COLORURINE YELLOW 03/07/2020 1128   APPEARANCEUR HAZY (A) 03/07/2020 1128   LABSPEC 1.008 03/07/2020 1128   PHURINE 9.0 (H) 03/07/2020 1128   GLUCOSEU NEGATIVE 03/07/2020 1128   GLUCOSEU  NEGATIVE 01/30/2020 1518   HGBUR SMALL (A) 03/07/2020 1128   BILIRUBINUR NEGATIVE 03/07/2020 1128   KETONESUR NEGATIVE 03/07/2020 1128   PROTEINUR 100 (A) 03/07/2020 1128   UROBILINOGEN 0.2 01/30/2020 1518   NITRITE NEGATIVE 03/07/2020 1128   LEUKOCYTESUR LARGE (A) 03/07/2020 1128    Sepsis Labs: Lactic Acid, Venous    Component Value Date/Time   LATICACIDVEN 0.66 12/08/2016 0605    MICROBIOLOGY: Recent Results (from the past 240 hour(s))  Urine culture     Status: None   Collection Time: 03/06/20  1:11 PM   Specimen: Urine, Random  Result Value Ref Range Status   Specimen Description URINE, RANDOM  Final   Special Requests NONE  Final   Culture   Final    NO GROWTH Performed at [facility], [place] [address] [place], [place] [address]    Report Status 03/08/2020 FINAL  Final  Blood culture (routine x 2)     Status: None   Collection Time: 03/06/20  2:37 PM   Specimen: BLOOD RIGHT FOREARM  Result Value Ref Range Status   Specimen Description BLOOD RIGHT FOREARM  Final   Special Requests   Final    BOTTLES DRAWN AEROBIC AND ANAEROBIC Blood Culture results may not be optimal due to an inadequate volume of blood received in culture bottles   Culture   Final    NO GROWTH 5 DAYS Performed at [facility], [place] [address] [place], [place] [address]    Report Status 03/11/2020 FINAL  Final  Blood culture (routine x 2)     Status: None   Collection Time: 03/06/20  3:15 PM   Specimen: BLOOD  Result Value Ref Range Status   Specimen Description BLOOD RIGHT ANTECUBITAL  Final   Special Requests   Final    BOTTLES DRAWN AEROBIC AND ANAEROBIC Blood Culture results may not be optimal due to an inadequate volume of blood received in culture bottles   Culture   Final    NO GROWTH 5 DAYS Performed at [facility], [place] [address] [place], [place] [address]    Report Status 03/11/2020 FINAL  Final  Respiratory Panel by RT PCR (Flu A&B, Covid) - Nasopharyngeal Swab      Status: None   Collection Time: 03/06/20 11:15 PM   Specimen: Nasopharyngeal Swab  Result Value Ref Range Status   SARS Coronavirus 2 by RT PCR NEGATIVE NEGATIVE Final    Comment: (NOTE) SARS-CoV-2 target nucleic acids are NOT DETECTED. The SARS-CoV-2 RNA is generally detectable in upper respiratoy specimens during the acute phase of infection. The lowest concentration of SARS-CoV-2 viral copies this assay can detect is 131 copies/mL. A negative result does not preclude SARS-Cov-2 infection and should not be used as the sole basis for treatment or other patient management decisions. A negative result may occur with  improper specimen collection/handling, submission of specimen other than nasopharyngeal swab, presence of viral mutation(s) within the areas targeted by this assay, and inadequate number of viral copies (<131 copies/mL). A negative result must be combined with clinical observations, patient history, and epidemiological information. The expected result is Negative. Fact Sheet for Patients:  PinkCheek.be Fact Sheet for Healthcare Providers:  GravelBags.it This test is not yet ap proved or cleared by the [place] FDA and  has been authorized for detection and/or diagnosis of SARS-CoV-2 by FDA under an Emergency Use Authorization (EUA). This EUA will remain  in effect (meaning this test can be used) for the duration of the COVID-19 declaration under Section 564(b)(1) of the Act, 21 U.S.C. section 360bbb-3(b)(1), unless the authorization is terminated or revoked sooner.    Influenza A by PCR NEGATIVE NEGATIVE Final   Influenza B by PCR NEGATIVE NEGATIVE Final    Comment: (NOTE) The Xpert Xpress SARS-CoV-2/FLU/RSV assay is intended as an aid in  the diagnosis of influenza from Nasopharyngeal swab specimens and  should not be used as a sole basis for treatment. Nasal washings and  aspirates are unacceptable  for Xpert Xpress SARS-CoV-2/FLU/RSV  testing. Fact Sheet for Patients: PinkCheek.be Fact Sheet for Healthcare Providers: GravelBags.it This test is not yet approved or cleared by the [place] FDA and  has been authorized for detection and/or diagnosis of SARS-CoV-2 by  FDA under an Emergency Use Authorization (EUA). This EUA will remain  in effect (meaning this test can be used) for the duration of the  Covid-19 declaration under Section 564(b)(1) of the Act, 21  U.S.C. section 360bbb-3(b)(1), unless the authorization is  terminated or revoked. Performed at [facility], [place] [address], [place], [place] [address]   SARS CORONAVIRUS 2 (TAT 6-24 HRS) Nasopharyngeal Nasopharyngeal Swab     Status: None   Collection Time: 03/10/20  1:20 PM   Specimen: Nasopharyngeal Swab  Result Value Ref Range Status   SARS Coronavirus 2 NEGATIVE NEGATIVE Final    Comment: (NOTE) SARS-CoV-2 target nucleic acids are NOT DETECTED. The SARS-CoV-2 RNA is generally detectable in upper and lower respiratory specimens during the acute phase of infection. Negative results do not preclude SARS-CoV-2 infection, do not rule out co-infections with other pathogens, and should not be used as the sole basis for treatment or other patient management decisions. Negative results must be combined with  clinical observations, patient history, and epidemiological information. The expected result is Negative. Fact Sheet for Patients: SugarRoll.be Fact Sheet for Healthcare Providers: https://www.woods-mathews.com/ This test is not yet approved or cleared by the [place] FDA and  has been authorized for detection and/or diagnosis of SARS-CoV-2 by FDA under an Emergency Use Authorization (EUA). This EUA will remain  in effect (meaning this test can be used) for the duration of the COVID-19 declaration under  Section 56 4(b)(1) of the Act, 21 U.S.C. section 360bbb-3(b)(1), unless the authorization is terminated or revoked sooner. Performed at [facility], [place] [address] [place], [place] [address]   MRSA PCR Screening     Status: None   Collection Time: 03/11/20  3:06 AM   Specimen: Nasal Mucosa; Nasopharyngeal  Result Value Ref Range Status   MRSA by PCR NEGATIVE NEGATIVE Final    Comment:        The GeneXpert MRSA Assay (FDA approved for NASAL specimens only), is one component of a comprehensive MRSA colonization surveillance program. It is not intended to diagnose MRSA infection nor to guide or monitor treatment for MRSA infections. Performed at [facility], [place] [address] [place], [place] [address]     RADIOLOGY STUDIES/RESULTS: CT Head Wo Contrast  Result Date: 03/10/2020 CLINICAL DATA:  Evaluate right subdural hematoma EXAM: CT HEAD WITHOUT CONTRAST TECHNIQUE: Contiguous axial images were obtained from the base of the skull through the vertex without intravenous contrast. COMPARISON:  03/06/2020 FINDINGS: Technical note: Examination degraded by motion artifact. Brain: Redemonstration of a slightly hyperdense extra-axial collection overlying the right cerebral convexity, which appears minimally decreased in size compared to previous exam. Collection measures up to 8 mm in maximal thickness (series 10, image 25), previously measured 10 mm when remeasured at a similar location. No significant shift of the midline structures. No evidence of new large territory infarction. No hydrocephalus. Extensive low-density changes within the periventricular and subcortical white matter compatible with chronic microvascular ischemic change. Moderate diffuse cerebral volume loss. Vascular: Atherosclerotic calcifications involving the large vessels of the skull base. No unexpected hyperdense vessel. Skull: Negative for calvarial fracture. Sinuses/Orbits: Paranasal sinuses are clear. Chronic  soft tissue thickening and erosion of the external auditory canal. Other: None. IMPRESSION: 1. Redemonstration of a right-sided subdural hematoma, which appears minimally decreased in size compared to previous exam. No significant shift of the midline structures. 2. Extensive low-density changes within the periventricular and subcortical white matter compatible with chronic microvascular ischemic change. 3. Moderate diffuse cerebral volume loss. 4. Soft tissue thickening with bony erosion at the right external auditory canal, similar in appearance to temporal bone CT of 08/19/2019. Electronically Signed   By: Davina Poke D.O.   On: 03/10/2020 13:19   DG CHEST PORT 1 VIEW  Result Date: 03/10/2020 CLINICAL DATA:  Altered mental status. EXAM: PORTABLE CHEST 1 VIEW COMPARISON:  Mar 06, 2020. FINDINGS: Stable cardiomegaly. Right internal jugular dialysis catheter is unchanged in position. No pneumothorax is noted. Mild bibasilar opacities are noted concerning for atelectasis or edema with associated pleural effusions. Bony thorax is unremarkable. Atherosclerosis of thoracic aorta is noted. IMPRESSION: Mild bibasilar opacities are noted concerning for atelectasis or edema with associated pleural effusions. Aortic Atherosclerosis (ICD10-I70.0). Electronically Signed   By: Marijo Conception M.D.   On: 03/10/2020 18:17     LOS: 0 days   Oren Binet, MD  [facility]    To contact the attending provider between 7A-7P or the covering provider during after  hours 7P-7A, please log into the web site [email or web address] and access using universal [place] password for that web site. If you do not have the password, please call the hospital operator.  03/11/2020, 9:54 AM

## 2020-03-11 NOTE — Progress Notes (Signed)
Hypoglycemic Event  CBG: 52  Treatment: 1 tube glucose gel  Symptoms: None  Follow-up CBG: Time:0342 CBG Result:51  Possible Reasons for Event: Unknown  Comments/MD notified:per hypoglycemia protocol    [name] [name]

## 2020-03-12 ENCOUNTER — Inpatient Hospital Stay (HOSPITAL_COMMUNITY): Payer: Medicare Other

## 2020-03-12 LAB — GLUCOSE, CAPILLARY
Glucose-Capillary: 119 mg/dL — ABNORMAL HIGH (ref 70–99)
Glucose-Capillary: 16 mg/dL — CL (ref 70–99)
Glucose-Capillary: 17 mg/dL — CL (ref 70–99)
Glucose-Capillary: 43 mg/dL — CL (ref 70–99)
Glucose-Capillary: 52 mg/dL — ABNORMAL LOW (ref 70–99)
Glucose-Capillary: 60 mg/dL — ABNORMAL LOW (ref 70–99)
Glucose-Capillary: 60 mg/dL — ABNORMAL LOW (ref 70–99)
Glucose-Capillary: 76 mg/dL (ref 70–99)
Glucose-Capillary: 81 mg/dL (ref 70–99)
Glucose-Capillary: 85 mg/dL (ref 70–99)
Glucose-Capillary: 96 mg/dL (ref 70–99)
Glucose-Capillary: 98 mg/dL (ref 70–99)
Glucose-Capillary: <10 mg/dL — CL (ref 70–99)

## 2020-03-12 LAB — RENAL FUNCTION PANEL
Albumin: 2.9 g/dL — ABNORMAL LOW (ref 3.5–5.0)
Anion gap: 8 (ref 5–15)
BUN: 11 mg/dL (ref 8–23)
CO2: 28 mmol/L (ref 22–32)
Calcium: 8.7 mg/dL — ABNORMAL LOW (ref 8.9–10.3)
Chloride: 97 mmol/L — ABNORMAL LOW (ref 98–111)
Creatinine, Ser: 4.34 mg/dL — ABNORMAL HIGH (ref 0.61–1.24)
GFR calc Af Amer: 13 mL/min — ABNORMAL LOW (ref >60)
GFR calc non Af Amer: 11 mL/min — ABNORMAL LOW (ref >60)
Glucose, Bld: 99 mg/dL (ref 70–99)
Phosphorus: 1.9 mg/dL — ABNORMAL LOW (ref 2.5–4.6)
Potassium: 3.3 mmol/L — ABNORMAL LOW (ref 3.5–5.1)
Sodium: 133 mmol/L — ABNORMAL LOW (ref 135–145)

## 2020-03-12 LAB — CBC
HCT: 30.3 % — ABNORMAL LOW (ref 39.0–52.0)
Hemoglobin: 9.6 g/dL — ABNORMAL LOW (ref 13.0–17.0)
MCH: 31.1 pg (ref 26.0–34.0)
MCHC: 31.7 g/dL (ref 30.0–36.0)
MCV: 98.1 fL (ref 80.0–100.0)
Platelets: 280 10*3/uL (ref 150–400)
RBC: 3.09 MIL/uL — ABNORMAL LOW (ref 4.22–5.81)
RDW: 17.3 % — ABNORMAL HIGH (ref 11.5–15.5)
WBC: 9.7 10*3/uL (ref 4.0–10.5)
nRBC: 0.2 % (ref 0.0–0.2)

## 2020-03-12 MED ORDER — MIDODRINE HCL 5 MG PO TABS
ORAL_TABLET | ORAL | Status: AC
  Administered 2020-03-12: 10 mg via ORAL
  Filled 2020-03-12: qty 2

## 2020-03-12 MED ORDER — RENA-VITE PO TABS
1.0000 | ORAL_TABLET | Freq: Every day | ORAL | Status: DC
  Administered 2020-03-12 – 2020-03-21 (×10): 1 via ORAL
  Filled 2020-03-12 (×10): qty 1

## 2020-03-12 MED ORDER — DEXTROSE 50 % IV SOLN
INTRAVENOUS | Status: AC
  Administered 2020-03-12: 50 mL
  Filled 2020-03-12: qty 50

## 2020-03-12 MED ORDER — SODIUM CHLORIDE 0.9% FLUSH: 10.0000 mL | INTRAVENOUS | Status: DC | PRN

## 2020-03-12 MED ORDER — POTASSIUM PHOSPHATES 15 MMOLE/5ML IV SOLN
10.0000 mmol | Freq: Once | INTRAVENOUS | Status: AC
  Administered 2020-03-12: 10 mmol via INTRAVENOUS
  Filled 2020-03-12: qty 3.33

## 2020-03-12 MED ORDER — CINACALCET HCL 30 MG PO TABS
ORAL_TABLET | ORAL | Status: AC
  Administered 2020-03-12: 30 mg via ORAL
  Filled 2020-03-12: qty 1

## 2020-03-12 MED ORDER — PRO-STAT SUGAR FREE PO LIQD
30.0000 mL | Freq: Two times a day (BID) | ORAL | Status: DC
  Administered 2020-03-12 – 2020-03-22 (×20): 30 mL via ORAL
  Filled 2020-03-12 (×20): qty 30

## 2020-03-12 MED ORDER — HEPARIN SODIUM (PORCINE) 1000 UNIT/ML IJ SOLN
INTRAMUSCULAR | Status: AC
  Administered 2020-03-12: 3800 [IU] via INTRAVENOUS_CENTRAL
  Filled 2020-03-12: qty 4

## 2020-03-12 MED ORDER — NEPRO/CARBSTEADY PO LIQD
237.0000 mL | Freq: Three times a day (TID) | ORAL | Status: DC
  Administered 2020-03-12 – 2020-03-16 (×12): 237 mL via ORAL

## 2020-03-12 NOTE — Progress Notes (Signed)
MEDICATION RELATED CONSULT NOTE - INITIAL   Pharmacy Consult for phosphorus replacement Indication: hypophosphatemia  No Known Allergies  Patient Measurements: Height: 5\' 9"  (175.3 cm) Weight: 62.7 kg (138 lb 3.7 oz) IBW/kg (Calculated) : 70.7 Adjusted Body Weight:   Vital Signs: Temp: 97.7 F (36.5 C) (05/07 1153) Temp Source: Oral (05/07 1153) BP: 162/106 (05/07 1153) Pulse Rate: 55 (05/07 1202) Intake/Output from previous day: 05/06 0701 - 05/07 0700 In: 1008.1 [P.O.:300; I.V.:608.1; IV Piggyback:100] Out: 3503  Intake/Output from this shift: Total I/O In: -  Out: 2000 [Other:2000]  Labs: Recent Labs    03/10/20 1204 03/11/20 0500 03/12/20 0814 03/12/20 0815  WBC 11.3* 10.9*  --  9.7  HGB 9.9* 9.4*  --  9.6*  HCT 30.7* 29.3*  --  30.3*  PLT 281 265  --  280  CREATININE 6.63* 7.38* 4.34*  --   PHOS 2.8 2.9 1.9*  --   ALBUMIN 3.4* 3.1* 2.9*  --    Estimated Creatinine Clearance: 10.4 mL/min (A) (by C-G formula based on SCr of 4.34 mg/dL (H)).   Microbiology: Recent Results (from the past 720 hour(s))  Urine culture     Status: None   Collection Time: 03/06/20  1:11 PM   Specimen: Urine, Random  Result Value Ref Range Status   Specimen Description URINE, RANDOM  Final   Special Requests NONE  Final   Culture   Final    NO GROWTH Performed at [facility], [address], [place], [place] [address]    Report Status 03/08/2020 FINAL  Final  Blood culture (routine x 2)     Status: None   Collection Time: 03/06/20  2:37 PM   Specimen: BLOOD RIGHT FOREARM  Result Value Ref Range Status   Specimen Description BLOOD RIGHT FOREARM  Final   Special Requests   Final    BOTTLES DRAWN AEROBIC AND ANAEROBIC Blood Culture results may not be optimal due to an inadequate volume of blood received in culture bottles   Culture   Final    NO GROWTH 5 DAYS Performed at [facility], [place] [address], [place], [place] [address]    Report Status 03/11/2020  FINAL  Final  Blood culture (routine x 2)     Status: None   Collection Time: 03/06/20  3:15 PM   Specimen: BLOOD  Result Value Ref Range Status   Specimen Description BLOOD RIGHT ANTECUBITAL  Final   Special Requests   Final    BOTTLES DRAWN AEROBIC AND ANAEROBIC Blood Culture results may not be optimal due to an inadequate volume of blood received in culture bottles   Culture   Final    NO GROWTH 5 DAYS Performed at [facility], [place] [address] [place], [place] [address]    Report Status 03/11/2020 FINAL  Final  Respiratory Panel by RT PCR (Flu A&B, Covid) - Nasopharyngeal Swab     Status: None   Collection Time: 03/06/20 11:15 PM   Specimen: Nasopharyngeal Swab  Result Value Ref Range Status   SARS Coronavirus 2 by RT PCR NEGATIVE NEGATIVE Final    Comment: (NOTE) SARS-CoV-2 target nucleic acids are NOT DETECTED. The SARS-CoV-2 RNA is generally detectable in upper respiratoy specimens during the acute phase of infection. The lowest concentration of SARS-CoV-2 viral copies this assay can detect is 131 copies/mL. A negative result does not preclude SARS-Cov-2 infection and should not be used as the sole basis for treatment or other patient management  decisions. A negative result may occur with  improper specimen collection/handling, submission of specimen other than nasopharyngeal swab, presence of viral mutation(s) within the areas targeted by this assay, and inadequate number of viral copies (<131 copies/mL). A negative result must be combined with clinical observations, patient history, and epidemiological information. The expected result is Negative. Fact Sheet for Patients:  PinkCheek.be Fact Sheet for Healthcare Providers:  GravelBags.it This test is not yet ap proved or cleared by the [place] FDA and  has been authorized for detection and/or diagnosis of SARS-CoV-2 by FDA under an Emergency Use  Authorization (EUA). This EUA will remain  in effect (meaning this test can be used) for the duration of the COVID-19 declaration under Section 564(b)(1) of the Act, 21 U.S.C. section 360bbb-3(b)(1), unless the authorization is terminated or revoked sooner.    Influenza A by PCR NEGATIVE NEGATIVE Final   Influenza B by PCR NEGATIVE NEGATIVE Final    Comment: (NOTE) The Xpert Xpress SARS-CoV-2/FLU/RSV assay is intended as an aid in  the diagnosis of influenza from Nasopharyngeal swab specimens and  should not be used as a sole basis for treatment. Nasal washings and  aspirates are unacceptable for Xpert Xpress SARS-CoV-2/FLU/RSV  testing. Fact Sheet for Patients: PinkCheek.be Fact Sheet for Healthcare Providers: GravelBags.it This test is not yet approved or cleared by the [place] FDA and  has been authorized for detection and/or diagnosis of SARS-CoV-2 by  FDA under an Emergency Use Authorization (EUA). This EUA will remain  in effect (meaning this test can be used) for the duration of the  Covid-19 declaration under Section 564(b)(1) of the Act, 21  U.S.C. section 360bbb-3(b)(1), unless the authorization is  terminated or revoked. Performed at [facility], [place] [address], [place], [place] [address]   SARS CORONAVIRUS 2 (TAT 6-24 HRS) Nasopharyngeal Nasopharyngeal Swab     Status: None   Collection Time: 03/10/20  1:20 PM   Specimen: Nasopharyngeal Swab  Result Value Ref Range Status   SARS Coronavirus 2 NEGATIVE NEGATIVE Final    Comment: (NOTE) SARS-CoV-2 target nucleic acids are NOT DETECTED. The SARS-CoV-2 RNA is generally detectable in upper and lower respiratory specimens during the acute phase of infection. Negative results do not preclude SARS-CoV-2 infection, do not rule out co-infections with other pathogens, and should not be used as the sole basis for treatment or other patient management  decisions. Negative results must be combined with clinical observations, patient history, and epidemiological information. The expected result is Negative. Fact Sheet for Patients: SugarRoll.be Fact Sheet for Healthcare Providers: https://www.woods-mathews.com/ This test is not yet approved or cleared by the [place] FDA and  has been authorized for detection and/or diagnosis of SARS-CoV-2 by FDA under an Emergency Use Authorization (EUA). This EUA will remain  in effect (meaning this test can be used) for the duration of the COVID-19 declaration under Section 56 4(b)(1) of the Act, 21 U.S.C. section 360bbb-3(b)(1), unless the authorization is terminated or revoked sooner. Performed at [facility], [place] [address] [place], [place] [address]   MRSA PCR Screening     Status: None   Collection Time: 03/11/20  3:06 AM   Specimen: Nasal Mucosa; Nasopharyngeal  Result Value Ref Range Status   MRSA by PCR NEGATIVE NEGATIVE Final    Comment:        The GeneXpert MRSA Assay (FDA approved for NASAL specimens only), is one component of a comprehensive MRSA colonization surveillance program. It is not intended  to diagnose MRSA infection nor to guide or monitor treatment for MRSA infections. Performed at [facility], [place] [address], [place], [place] [address]     Medical History: Past Medical History:  Diagnosis Date  . ANEMIA-IRON DEFICIENCY 03/09/2008  . Arthritis    cervical spine.   . Complex renal cyst 06/19/2011  . Depression 03/26/2014  . DIVERTICULOSIS, COLON 03/09/2008  . ESRD (end stage renal disease) ([place]) 03/09/2008  . Flu 12/08/2016  . GOUT 03/09/2008  . Hemorrhoids 08/2009   internal.   . History of blood transfusion   . HYPERLIPIDEMIA 03/09/2008  . Hyperparathyroidism ([place])   . HYPERTENSION 03/09/2008  . Prostate cancer [facility]) 2002   Completed external beam radiation 2003.per HPI  . PROSTATE CANCER, HX OF 03/09/2008   . Radiation cystitis 2010.  Marland Kitchen Stroke [facility])     Medications:  Medications Prior to Admission  Medication Sig Dispense Refill Last Dose  . allopurinol (ZYLOPRIM) 100 MG tablet TAKE 1 TABLET BY MOUTH EVERY DAY (Patient taking differently: Take 100 mg by mouth daily. ) 90 tablet 3   . amoxicillin-clavulanate (AUGMENTIN) 500-125 MG tablet Take 1 tablet (500 mg total) by mouth at bedtime for 5 days. 5 tablet 0   . aspirin EC 81 MG tablet Take 1 tablet (81 mg total) by mouth daily.     Lorin Picket 1 GM 210 MG(Fe) tablet Take 420 mg by mouth 3 (three) times daily with meals.   10   . calcitRIOL (ROCALTROL) 0.5 MCG capsule Take 3 capsules (1.5 mcg total) by mouth every Monday, Wednesday, and Friday with hemodialysis. 36 capsule 0   . cinacalcet (SENSIPAR) 30 MG tablet Take 1 tablet (30 mg total) by mouth every Monday, Wednesday, and Friday with hemodialysis. 12 tablet 0   . citalopram (CELEXA) 10 MG tablet Take 1 tablet (10 mg total) by mouth daily. 90 tablet 1   . [START ON 03/15/2020] Darbepoetin Alfa (ARANESP) 60 MCG/0.3ML SOSY injection Inject 0.3 mLs (60 mcg total) into the vein every Monday with hemodialysis. 4.2 mL    . latanoprost (XALATAN) 0.005 % ophthalmic solution Place 1 drop into both eyes at bedtime.   5   . lidocaine-prilocaine (EMLA) cream Apply 1 application topically See admin instructions. Apply to access site 1 hour before dialysis. Cover with saran wrap  11   . midodrine (PROAMATINE) 10 MG tablet Take 1 tablet (10 mg total) by mouth 3 (three) times daily. Take in the morning, lunch time and 4pm. Do not take after 4pm. 270 tablet 3   . Multiple Vitamin (MULTIVITAMIN WITH MINERALS) TABS tablet Take 1 tablet by mouth daily.     Marland Kitchen nystatin (MYCOSTATIN/NYSTOP) powder Use as directed twice per day (Patient taking differently: Apply 1 g topically 2 (two) times daily as needed (rash). ) 45 g 1   . rosuvastatin (CRESTOR) 20 MG tablet Take 1 tablet (20 mg total) by mouth at bedtime. 90 tablet 3    . Vitamin D, Ergocalciferol, (DRISDOL) 1.25 MG (50000 UNIT) CAPS capsule Take 1 capsule (50,000 Units total) by mouth every 7 (seven) days. 12 capsule 0    Scheduled:  . allopurinol  100 mg Oral Daily  . calcitRIOL  1.5 mcg Oral Q M,W,F-HD  . Chlorhexidine Gluconate Cloth  6 each Topical Q0600  . cinacalcet  30 mg Oral Q M,W,F-HD  . citalopram  10 mg Oral Daily  . darbepoetin (ARANESP) injection - DIALYSIS  60 mcg Intravenous Q Thu-HD  . BioGaia Probiotic  5 drop Oral Q2000  . latanoprost  1 drop Both Eyes QHS  . midodrine  10 mg Oral TID  . multivitamin with minerals  1 tablet Oral Daily  . rosuvastatin  20 mg Oral QHS    Assessment: Pt is an ESRD HD dependent patient that has a low phos level this AM at 1.9. His phos binder has been dced. Rx has bee asked to give some replacement. Since he is an ESRD patient, we will use a lower dose to replete his phos.  K+ 3.3 PO4 1.9 cCA 9.6  Plan:  Kphos 54mmol IV x1 Phos level in AM  Onnie Boer, PharmD, [place], AAHIVP, CPP Infectious Disease Pharmacist 03/12/2020 12:59 PM

## 2020-03-12 NOTE — Evaluation (Signed)
Physical Therapy Evaluation Patient Details Name: Gabriel Skalla [place] Sr. MRN: [id number] DOB: 11-10-1928 Today's Date: 03/12/2020   History of Present Illness  Pt is an 86 y/o male with PMH of ESRD on HD MWF, HLD, anemia of chronic disease, orthostatic hypotension, depression who was found to be confused at HD and sent to the hospital for further evaluation. Pt found to be hypoglycemic and with acute metabolic encephalopathy. Of note, pt with recent admission (5/1-5/4) secondary to falls with a SDH.    Clinical Impression  Pt presented supine in bed with HOB elevated, initially asleep but easily aroused and willing to participate in therapy session. Pt very confused throughout and unable to provide any reliable information regarding his home environment or PLOF. Pt initially stating that he lives with his wife and grandson, but then later reporting that he "lost his wife" last year. Attempted to call pt's son after session but no answer. At the time of evaluation, pt significantly limited with functional mobility secondary to confusion, lethargy and fatigue (pt had HD earlier today). Based on pt's current functional mobility and cognitive status as well as his history of recurrent falls, PT recommending pt d/c to SNF for further intensive therapy services prior to returning home with family support.     Follow Up Recommendations SNF    Equipment Recommendations  Other (comment)(defer to next venue of care)    Recommendations for Other Services       Precautions / Restrictions Precautions Precautions: Fall Restrictions Weight Bearing Restrictions: No      Mobility  Bed Mobility Overal bed mobility: Needs Assistance Bed Mobility: Supine to Sit;Sit to Supine     Supine to sit: Mod assist;HOB elevated Sit to supine: Min assist   General bed mobility comments: increased time and effort, multimodal cueing needed, min A to move bilateral LEs off of and back onto bed, mod A needed for trunk  elevation  Transfers                 General transfer comment: pt very lethargic and also wanting to lie back down in bed, only briefly tolerating sitting EOB (~30 seconds) before lying back down in bed, not wanting to attempt to stand  Ambulation/Gait                Stairs            Wheelchair Mobility    Modified Rankin (Stroke Patients Only)       Balance Overall balance assessment: Needs assistance;History of Falls Sitting-balance support: Feet supported;Bilateral upper extremity supported;Single extremity supported Sitting balance-Leahy Scale: Poor                                       Pertinent Vitals/Pain Pain Assessment: No/denies pain Pain Score: 0-No pain    Home Living Family/patient expects to be discharged to:: Unsure                 Additional Comments: no family present; pt with confusion and unable to answer any questions    Prior Function           Comments: Unsure - no family present; pt with confusion and unable to answer any questions     Hand Dominance        Extremity/Trunk Assessment   Upper Extremity Assessment Upper Extremity Assessment: Difficult to assess due to impaired cognition  Lower Extremity Assessment Lower Extremity Assessment: Difficult to assess due to impaired cognition       Communication   Communication: HOH  Cognition Arousal/Alertness: Lethargic Behavior During Therapy: Restless Overall Cognitive Status: No family/caregiver present to determine baseline cognitive functioning Area of Impairment: Orientation;Following commands;Problem solving                 Orientation Level: Disoriented to;Place;Time;Situation     Following Commands: Follows one step commands inconsistently     Problem Solving: Slow processing;Difficulty sequencing;Requires verbal cues;Requires tactile cues        General Comments      Exercises     Assessment/Plan    PT  Assessment Patient needs continued PT services  PT Problem List Decreased strength;Decreased range of motion;Decreased activity tolerance;Decreased balance;Decreased coordination;Decreased mobility;Decreased cognition;Decreased knowledge of use of DME;Decreased safety awareness;Decreased knowledge of precautions       PT Treatment Interventions DME instruction;Gait training;Stair training;Therapeutic activities;Functional mobility training;Therapeutic exercise;Balance training;Neuromuscular re-education;Cognitive remediation;Patient/family education    PT Goals (Current goals can be found in the Care Plan section)  Acute Rehab PT Goals Patient Stated Goal: to sleep PT Goal Formulation: Patient unable to participate in goal setting Time For Goal Achievement: 03/26/20 Potential to Achieve Goals: Good    Frequency Min 2X/week   Barriers to discharge        Co-evaluation               AM-PAC PT "6 Clicks" Mobility  Outcome Measure Help needed turning from your back to your side while in a flat bed without using bedrails?: A Little Help needed moving from lying on your back to sitting on the side of a flat bed without using bedrails?: A Lot Help needed moving to and from a bed to a chair (including a wheelchair)?: A Lot Help needed standing up from a chair using your arms (e.g., wheelchair or bedside chair)?: A Lot Help needed to walk in hospital room?: A Lot Help needed climbing 3-5 steps with a railing? : Total 6 Click Score: 12    End of Session   Activity Tolerance: Patient limited by lethargy;Patient limited by fatigue Patient left: in bed;with bed alarm set;with call bell/phone within reach Nurse Communication: Mobility status PT Visit Diagnosis: Other abnormalities of gait and mobility (R26.89);Repeated falls (R29.6)    Time: 1610-9604 PT Time Calculation (min) (ACUTE ONLY): 13 min   Charges:   PT Evaluation $PT Eval Moderate Complexity: 1 Mod           Eduard Clos, PT, DPT  Acute Rehabilitation Services Pager [phone] Office [place] 03/12/2020, 2:56 PM

## 2020-03-12 NOTE — Progress Notes (Signed)
Initial Nutrition Assessment  **RD working remotely**  DOCUMENTATION CODES:   Not applicable  INTERVENTION:  Nepro Shake po TID, each supplement provides 425 kcal and 19 grams protein  57ml Pro-stat BID, each supplement provides 100 kcal and 15 grams protein  Rena-vit daily  NUTRITION DIAGNOSIS:   Increased nutrient needs related to chronic illness(ESRD on HD) as evidenced by estimated needs.   GOAL:   Patient will meet greater than or equal to 90% of their needs   MONITOR:   PO intake, Supplement acceptance, Diet advancement, Weight trends, Labs, I & O's  REASON FOR ASSESSMENT:   Consult Assessment of nutrition requirement/status  ASSESSMENT:   Pt with a PMH significant for ESRD on HD MWF, HLD, anemia of chronic disease, orthostatic hypotension, depression with recent SDH who was found confused at HD. Pt admitted with hypoglycemia and acute metabolic encephalopathy  RD unable to reach pt via phone.   PO Intake 0-100% x4 recorded meals; however, 1 of the documented meals was just pudding and another was ice cream.   EDW 66 kg per nephrology documentation; however, nephrology noted to want to better assess EDW.  Current wt 62.7 kg  Malnutrition is suspected; however, unable to diagnose with detailed diet history and nutrition-focused physical exam.   Labs: Na 133 (L), K+ 3.3 (L), Phosphorus 1.9 (L) CBGs 60-96  Medications reviewed and include: Rocaltrol, Sensipar, Aranesp, Biogaia, MVI, D5 @ 29ml/hr, Potasium phosphate 10 mmol in D5   NUTRITION - FOCUSED PHYSICAL EXAM:  RD unable to perform at this time, working remotely.    Diet Order:   Diet Order            DIET - DYS 1 Room service appropriate? No; Fluid consistency: Thin  Diet effective now              EDUCATION NEEDS:   No education needs have been identified at this time  Skin:  Skin Assessment: Reviewed RN Assessment  Last BM:  03/07/20  Height:   Ht Readings from Last 1 Encounters:   03/10/20 5\' 9"  (1.753 m)    Weight:   Wt Readings from Last 10 Encounters:  03/12/20 62.7 kg  03/08/20 82.6 kg  01/08/20 68.1 kg  12/04/19 66.2 kg  03/17/19 64 kg  10/10/18 65.8 kg  06/20/18 67.1 kg  02/19/18 68.9 kg  01/10/18 68 kg  10/08/17 69.9 kg    BMI:  Body mass index is 20.41 kg/m.  Estimated Nutritional Needs:   Kcal:  1950-2150  Protein:  100-110 grams  Fluid:  1021ml + UOP    Larkin Ina, MS, RD, LDN RD pager number and weekend/on-call pager number located in [place].

## 2020-03-12 NOTE — Progress Notes (Signed)
PT Cancellation Note  Patient Details Name: Gabriel Broom [place] Sr. MRN: [id number] DOB: 03-01-1929   Cancelled Treatment:    Reason Eval/Treat Not Completed: Patient at procedure or test/unavailable. Pt at HD. PT will continue to f/u with pt as available.    [place] 03/12/2020, 9:54 AM

## 2020-03-12 NOTE — Progress Notes (Signed)
Hypoglycemic Event  CBG=17  Treatment: 50 % Dextrose, ensure,  Juice, Ice cream   Follow-up CBG: ZBCA:1683  CBG Result:<10  Possible Reasons for Event:patient did not eat enough.   Comments/MD notified:MD Ghimire see new orders Recheck=81  At [place]

## 2020-03-12 NOTE — Progress Notes (Signed)
OT Cancellation Note  Patient Details Name: Gabriel Sweda Klus Sr. MRN: [id number] DOB: [birth year]   Cancelled Treatment:    Reason Eval/Treat Not Completed: Patient at procedure or test/ unavailable--pt in HD.  [place], OTR/L Acute Rehab Services Pager [phone] Office [phone]     Almon Register 03/12/2020, 9:25 AM

## 2020-03-12 NOTE — Progress Notes (Signed)
[facility] Progress Note   Subjective:   Patient seen and examined at bedside in dialysis.  Per patient feeling well today.  No specific complaints. No acute events overnight.   Objective Vitals:   03/12/20 0930 03/12/20 1000 03/12/20 1030 03/12/20 1100  BP: 134/72 (!) 147/82 138/67 (!) 151/81  Pulse: (!) 56 63 63 66  Resp:      Temp:      TempSrc:      SpO2:      Weight:      Height:       Physical Exam General:pleasantlt demented, frail, elderly male in NAD Heart:RRR Lungs:CTAB anteriorly  Abdomen:soft, NTND Extremities:no LE edema, bruising on L thigh/hip, +tenderness to palpation L hip; improvement in LUE swelling and bruising Dialysis Access: TDC, LU AVG no b/t   Filed Weights   03/11/20 0700 03/11/20 1145 03/12/20 0724  Weight: 82.6 kg 70.3 kg 64.9 kg    Intake/Output Summary (Last 24 hours) at 03/12/2020 1114 Last data filed at 03/12/2020 0433 Gross per 24 hour  Intake 1008.08 ml  Output 3503 ml  Net -2494.92 ml    Additional Objective Labs: Basic Metabolic Panel: Recent Labs  Lab 03/10/20 1204 03/11/20 0500 03/12/20 0814  NA 135 131* 133*  K 4.4 4.2 3.3*  CL 93* 88* 97*  CO2 28 28 28   GLUCOSE 95 90 99  BUN 31* 34* 11  CREATININE 6.63* 7.38* 4.34*  CALCIUM 9.9 9.5 8.7*  PHOS 2.8 2.9 1.9*   Liver Function Tests: Recent Labs  Lab 03/07/20 0358 03/07/20 0358 03/08/20 0303 03/08/20 0303 03/09/20 0403 03/09/20 0403 03/10/20 1204 03/11/20 0500 03/12/20 0814  AST 27  --  25  --  26  --   --   --   --   ALT 13  --  12  --  13  --   --   --   --   ALKPHOS 57  --  56  --  54  --   --   --   --   BILITOT 1.5*  --  1.4*  --  1.4*  --   --   --   --   PROT 6.0*  --  5.8*  --  5.8*  --   --   --   --   ALBUMIN 3.3*   < > 3.2*   < > 3.0*   < > 3.4* 3.1* 2.9*   < > = values in this interval not displayed.   CBC: Recent Labs  Lab 03/08/20 0303 03/08/20 0303 03/09/20 0403 03/09/20 0403 03/10/20 1204 03/11/20 0500 03/12/20 0815   WBC 13.4*   < > 12.6*   < > 11.3* 10.9* 9.7  HGB 7.3*   < > 8.9*   < > 9.9* 9.4* 9.6*  HCT 22.3*   < > 26.4*   < > 30.7* 29.3* 30.3*  MCV 94.9  --  94.0  --  96.2 96.1 98.1  PLT 219   < > 225   < > 281 265 280   < > = values in this interval not displayed.   Cardiac Enzymes: Recent Labs  Lab 03/06/20 1745  CKTOTAL 181   CBG: Recent Labs  Lab 03/11/20 1258 03/11/20 1603 03/11/20 2018 03/12/20 0052 03/12/20 0411  GLUCAP 112* 85 85 96 85   Studies/Results: CT Head Wo Contrast  Result Date: 03/10/2020 CLINICAL DATA:  Evaluate right subdural hematoma EXAM: CT HEAD WITHOUT CONTRAST TECHNIQUE: Contiguous axial images were  obtained from the base of the skull through the vertex without intravenous contrast. COMPARISON:  03/06/2020 FINDINGS: Technical note: Examination degraded by motion artifact. Brain: Redemonstration of a slightly hyperdense extra-axial collection overlying the right cerebral convexity, which appears minimally decreased in size compared to previous exam. Collection measures up to 8 mm in maximal thickness (series 10, image 25), previously measured 10 mm when remeasured at a similar location. No significant shift of the midline structures. No evidence of new large territory infarction. No hydrocephalus. Extensive low-density changes within the periventricular and subcortical white matter compatible with chronic microvascular ischemic change. Moderate diffuse cerebral volume loss. Vascular: Atherosclerotic calcifications involving the large vessels of the skull base. No unexpected hyperdense vessel. Skull: Negative for calvarial fracture. Sinuses/Orbits: Paranasal sinuses are clear. Chronic soft tissue thickening and erosion of the external auditory canal. Other: None. IMPRESSION: 1. Redemonstration of a right-sided subdural hematoma, which appears minimally decreased in size compared to previous exam. No significant shift of the midline structures. 2. Extensive low-density changes  within the periventricular and subcortical white matter compatible with chronic microvascular ischemic change. 3. Moderate diffuse cerebral volume loss. 4. Soft tissue thickening with bony erosion at the right external auditory canal, similar in appearance to temporal bone CT of 08/19/2019. Electronically Signed   By: Davina Poke D.O.   On: 03/10/2020 13:19   DG CHEST PORT 1 VIEW  Result Date: 03/10/2020 CLINICAL DATA:  Altered mental status. EXAM: PORTABLE CHEST 1 VIEW COMPARISON:  Mar 06, 2020. FINDINGS: Stable cardiomegaly. Right internal jugular dialysis catheter is unchanged in position. No pneumothorax is noted. Mild bibasilar opacities are noted concerning for atelectasis or edema with associated pleural effusions. Bony thorax is unremarkable. Atherosclerosis of thoracic aorta is noted. IMPRESSION: Mild bibasilar opacities are noted concerning for atelectasis or edema with associated pleural effusions. Aortic Atherosclerosis (ICD10-I70.0). Electronically Signed   By: Marijo Conception M.D.   On: 03/10/2020 18:17    Medications: . cefTRIAXone (ROCEPHIN)  IV 2 g (03/11/20 1716)  . dextrose 50 mL/hr at 03/12/20 1107   . allopurinol  100 mg Oral Daily  . calcitRIOL  1.5 mcg Oral Q M,W,F-HD  . Chlorhexidine Gluconate Cloth  6 each Topical Q0600  . cinacalcet  30 mg Oral Q M,W,F-HD  . citalopram  10 mg Oral Daily  . darbepoetin (ARANESP) injection - DIALYSIS  60 mcg Intravenous Q Thu-HD  . ferric citrate  420 mg Oral TID WC  . BioGaia Probiotic  5 drop Oral Q2000  . latanoprost  1 drop Both Eyes QHS  . midodrine  10 mg Oral TID  . multivitamin with minerals  1 tablet Oral Daily  . rosuvastatin  20 mg Oral QHS    Dialysis Orders: [facility]- MWF 4 hours, 400 BFR via TDC EDW 66- 2 K, 2 calc bath. No heparin  Calcitriol 1.5 mcg and sensipar 30 q HD, mircera 30  Assessment/Plan: 1. Confusion - waxing and waning, baseline dementia, hypoglycemic on arrival, +UTI, recent SDH. Rocephin  given.  2. Hypoglycemia - per primary  3. ESRD -on HD MWF.  HD today per regular schedule. No Heparin. Next HD 5/10. K 3.3 today, use 4K bath.  4. Anemia of CKD- Hgb^ 9.6.  ESA dosed on 5/3 5. Secondary hyperparathyroidism - Ca at goal. Phos low, will replete.  Hold binders.  Continue sensipar and VDRA. 6. HTN/volume - Blood pressure mildly elevated.  Does not appear volume overloaded. Titrate down volume as tolerated.  Request standing or hoyer weight  to better assess edw, difference of almost 20kg recorded.  UF removed yesterday 3.5L.  7. Nutrition - Renal diet w/fluid restrictions.   Jen Mow, PA-C [facility] Pager: [phone] 03/12/2020,11:14 AM  LOS: 1 day

## 2020-03-12 NOTE — Evaluation (Signed)
Clinical/Bedside Swallow Evaluation Patient Details  Name: Gabriel Mcfarland Amason Sr. MRN: [id number] Date of Birth: 06-09-1929  Today's Date: 03/12/2020 Time: SLP Start Time (ACUTE ONLY): 1227 SLP Stop Time (ACUTE ONLY): 1244 SLP Time Calculation (min) (ACUTE ONLY): 17 min  Past Medical History:  Past Medical History:  Diagnosis Date  . ANEMIA-IRON DEFICIENCY 03/09/2008  . Arthritis    cervical spine.   . Complex renal cyst 06/19/2011  . Depression 03/26/2014  . DIVERTICULOSIS, COLON 03/09/2008  . ESRD (end stage renal disease) ([place]) 03/09/2008  . Flu 12/08/2016  . GOUT 03/09/2008  . Hemorrhoids 08/2009   internal.   . History of blood transfusion   . HYPERLIPIDEMIA 03/09/2008  . Hyperparathyroidism ([place])   . HYPERTENSION 03/09/2008  . Prostate cancer [facility]) 2002   Completed external beam radiation 2003.per HPI  . PROSTATE CANCER, HX OF 03/09/2008  . Radiation cystitis 2010.  [facility] Stroke [facility])    Past Surgical History:  Past Surgical History:  Procedure Laterality Date  . AV FISTULA PLACEMENT Left 01/24/2013   Procedure: INSERTION OF ARTERIOVENOUS (AV) GORE-TEX GRAFT ARM;  Surgeon: Rosetta Posner, MD;  Location: [place];  Service: Vascular;  Laterality: Left;  . COLONOSCOPY N/A 06/17/2015   Procedure: COLONOSCOPY;  Surgeon: Gatha Mayer, MD;  Location: [place];  Service: Endoscopy;  Laterality: N/A;  . ESOPHAGOGASTRODUODENOSCOPY N/A 06/17/2015   Procedure: ESOPHAGOGASTRODUODENOSCOPY (EGD);  Surgeon: Gatha Mayer, MD;  Location: [facility] ENDOSCOPY;  Service: Endoscopy;  Laterality: N/A;  . FLEXIBLE SIGMOIDOSCOPY N/A 06/15/2015   Procedure: FLEXIBLE SIGMOIDOSCOPY;  Surgeon: Gatha Mayer, MD;  Location: [place];  Service: Endoscopy;  Laterality: N/A;  . GIVENS CAPSULE STUDY N/A 07/06/2015   Procedure: GIVENS CAPSULE STUDY;  Surgeon: Milus Banister, MD;  Location: [place];  Service: Endoscopy;  Laterality: N/A;  . JOINT REPLACEMENT     HIP  . rotator cuff repair right  4/08  . s/p left hip  replacement  2007   Dr. Percell Miller ortho  . TONSILLECTOMY     HPI:  [name] Sr. is a 86 y.o. male with medical history significant of ESRD, stroke, prostate cancer, anemia of chronic disease, HLD, orthostatic hypotension, depression, and gout presented to the hospital with confusion. Per chart recent hospitalization 5/1-5/4 after presenting with multiple falls found to have a right sided subdural hematoma without midline shift or mass-effect. Found to be hypoglycemic, ongoing UTI. CT scan and redemonstrated a right subdural hematoma which appeared to be minimally decreased in size. CXR Mild bibasilar opacities are noted concerning for atelectasis or edema with associated pleural effusions. BSE 03/08/20 > missing lower dentures, no overt s/s aspiration   Assessment / Plan / Recommendation Clinical Impression  Pt encountered sitting with lunch tray on table in front of him. He exhibits inefficient mastication and clearance indicated by discovery of dissolved pill on anterior tongue and partially masticated [place] sandwich that was expectorated. Intermittent throat clearing post thin liquid trials. Pt required redirection and was distracted. Lower denture plate missing which were misplaced prior admission per ST note 03/08/20. Diet texture will be downgraded to puree (Dys 1), continue thin, crush pills (RN was unable to crush pill pt pocketed and will try to obtain liquid/IV form if applicable. ST will check for efficiency with puree and determine possible upgrade.       SLP Visit Diagnosis: Dysphagia, unspecified (R13.10)    Aspiration Risk  Moderate aspiration risk    Diet Recommendation Dysphagia 1 (Puree);Thin liquid   Liquid  Administration via: Cup;Straw Medication Administration: Crushed with puree Supervision: Staff to assist with self feeding Compensations: Slow rate;Small sips/bites;Minimize environmental distractions;Follow solids with liquid Postural Changes: Seated upright at 90 degrees     Other  Recommendations Oral Care Recommendations: Oral care BID   Follow up Recommendations 24 hour supervision/assistance      Frequency and Duration min 1 x/week  1 week       Prognosis Prognosis for Safe Diet Advancement: Fair Barriers to Reach Goals: Cognitive deficits      Swallow Study   General HPI: [name] Sr. is a 86 y.o. male with medical history significant of ESRD, stroke, prostate cancer, anemia of chronic disease, HLD, orthostatic hypotension, depression, and gout presented to the hospital with confusion. Per chart recent hospitalization 5/1-5/4 after presenting with multiple falls found to have a right sided subdural hematoma without midline shift or mass-effect. Found to be hypoglycemic, ongoing UTI. CT scan and redemonstrated a right subdural hematoma which appeared to be minimally decreased in size. CXR Mild bibasilar opacities are noted concerning for atelectasis or edema with associated pleural effusions. BSE 03/08/20 > missing lower dentures, no overt s/s aspiration Type of Study: Bedside Swallow Evaluation Previous Swallow Assessment: (see HPI) Diet Prior to this Study: Regular;Thin liquids Temperature Spikes Noted: No Respiratory Status: Room air History of Recent Intubation: No Behavior/Cognition: Alert;Requires cueing;Cooperative Oral Cavity Assessment: Within Functional Limits Oral Care Completed by SLP: No Oral Cavity - Dentition: Dentures, top;Dentures, not available Self-Feeding Abilities: Needs assist Patient Positioning: Upright in bed Baseline Vocal Quality: Normal    Oral/Motor/Sensory Function Overall Oral Motor/Sensory Function: Within functional limits   Ice Chips Ice chips: Not tested   Thin Liquid Thin Liquid: Impaired Presentation: Cup;Straw Pharyngeal  Phase Impairments: Throat Clearing - Delayed    Nectar Thick Nectar Thick Liquid: Not tested   Honey Thick Honey Thick Liquid: Not tested   Puree Puree: Within functional limits    Solid     Solid: Impaired Oral Phase Impairments: Impaired mastication Oral Phase Functional Implications: Oral residue      [name] 03/12/2020,1:43 PM  Orbie Pyo Colvin Caroli.Ed Risk analyst [phone] Office [phone]

## 2020-03-12 NOTE — Progress Notes (Signed)
PROGRESS NOTE        PATIENT DETAILS Name: Gabriel Shaker Rylander Sr. Age: 86 y.o. Sex: male Date of Birth: 09-11-1929 Admit Date: 03/10/2020 Admitting Physician Evalee Mutton Kristeen Mans, MD [id number]:[id number], Hunt Oris, MD  Brief Narrative: Patient is a 86 y.o. male with history of ESRD on HD MWF, HLD, anemia of chronic disease, orthostatic hypotension, depression who was found to be confused at HD and sent to the hospital for further evaluation.  See below for further details.  Significant events: 5/5>> admit to [facility] for evaluation of encephalopathy/hypoglycemia. 5/1-5/4>> admit to [facility]-frequent falls-found to have SDH, left hip hematoma, acute blood loss anemia requiring 2 units of PRBC  Antimicrobial therapy: Rocephin: 5/5>>  Microbiology data: None  Procedures : None  Consults: Nephrology  DVT Prophylaxis : SCD's  Subjective: Much more awake and alert compared to yesterday-answer most of my questions appropriately.  Assessment/Plan: Hypoglycemia: Etiology uncertain-but suspect this is multifactorial-mostly from poor oral intake-failure to thrive syndrome/NSA elderly patient with ESRD.  Spoke with son-patient's wife has DM-and is on oral medications-son is not sure if patient may have inadvertently taken his wife medications.  Encourage oral intake-continue gentle hydration with D5W-we will get nutrition evaluation to add supplements.continue to follow CBGs-and provide supportive care.   Recent Labs    03/12/20 1151 03/12/20 1240 03/12/20 1314  GLUCAP 60* 60* 76    Acute metabolic encephalopathy: Somewhat better than yesterday-given history of dementia-suspect will have some amount of delirium in the hospital.  He does appear to be a little bit more alert-answer most of my questions appropriately.  Suspect etiology of encephalopathy to be a combination of hypoglycemia, recent SDH/ongoing UTI/infection.  CT head on admission showed decreased size of the SDH.  Continue  supportive care-if mentation worsens or does not improve further-we can contemplate further work-up.    Complicated UTI/Asp PNA: Thought to have UTI/asp PNA last admission-and discharged on Augmentin-currently on Rocephin.  Reviewed chest x-ray done on admission-appears to have a pleural effusion-clinically he is afebrile-and really is not very symptomatic from this pleural effusion.  However he does have severe failure to thrive syndrome-wonder if a a brewing parapneumonic effusion may be contributing to his failure to thrive syndrome.  We will obtain a dedicated CT chest-if he has a large enough effusion to be tapped-we can consider thoracocentesis at some point.  Continue Rocephin-follow CT chest.  ESRD on HD MWF: Nephrology following and directing care  Recent SDH: Following a mechanical fall (last admission)-repeat CT head this admission shows decreased size of SDH.  Recent left hip hematoma: Following a mechanical fall (last admission)-supportive care.  Hemoglobin currently stable-Did require PRBC transfusion last admission.  Anemia: Related to ESRD-hemoglobin stable with no need for transfusion.  Orthostatic hypotension: Continue midodrine-follow.  Depression: Continue Celexa  History of dementia: Stable-although has dementia-current mental status not at baseline.  Gout: No flare-continue allopurinol  Diet: Diet Order            DIET - DYS 1 Room service appropriate? No; Fluid consistency: Thin  Diet effective now               Code Status: Full code  Family Communication: Spoke with patient's son over the phone on 5/7.  Disposition Plan: Status is: Inpatient  Remains inpatient appropriate because:Altered mental status   Dispo: The patient is from: Home  Anticipated d/c is to: Home              Anticipated d/c date is: 2 days              Patient currently is not medically stable to d/c.   Barriers to Discharge: Hypoglycemia/ongoing  encephalopathy   Antimicrobial agents: Anti-infectives (From admission, onward)   Start     Dose/Rate Route Frequency Ordered Stop   03/10/20 2200  amoxicillin-clavulanate (AUGMENTIN) 500-125 MG per tablet 500 mg  Status:  Discontinued     500 mg Oral Daily at bedtime 03/10/20 1500 03/10/20 1752   03/10/20 1800  cefTRIAXone (ROCEPHIN) 2 g in sodium chloride 0.9 % 100 mL IVPB     2 g 200 mL/hr over 30 Minutes Intravenous Every 24 hours 03/10/20 1752         Time spent: 25 minutes-Greater than 50% of this time was spent in counseling, explanation of diagnosis, planning of further management, and coordination of care.  MEDICATIONS: Scheduled Meds: . allopurinol  100 mg Oral Daily  . calcitRIOL  1.5 mcg Oral Q M,W,F-HD  . Chlorhexidine Gluconate Cloth  6 each Topical Q0600  . cinacalcet  30 mg Oral Q M,W,F-HD  . citalopram  10 mg Oral Daily  . darbepoetin (ARANESP) injection - DIALYSIS  60 mcg Intravenous Q Thu-HD  . BioGaia Probiotic  5 drop Oral Q2000  . latanoprost  1 drop Both Eyes QHS  . midodrine  10 mg Oral TID  . multivitamin with minerals  1 tablet Oral Daily  . rosuvastatin  20 mg Oral QHS   Continuous Infusions: . cefTRIAXone (ROCEPHIN)  IV 2 g (03/11/20 1716)  . dextrose 50 mL/hr at 03/12/20 1107  . potassium PHOSPHATE IVPB (in mmol)     PRN Meds:.acetaminophen **OR** acetaminophen, albuterol, nystatin   PHYSICAL EXAM: Vital signs: Vitals:   03/12/20 1106 03/12/20 1110 03/12/20 1153 03/12/20 1202  BP: (!) 153/79 (!) 155/90 (!) 162/106   Pulse: 69 78 (!) 54 (!) 55  Resp:  15 (!) 22   Temp:  97.7 F (36.5 C) 97.7 F (36.5 C)   TempSrc:  Oral Oral   SpO2:  93% 90% 92%  Weight:  62.7 kg    Height:       Filed Weights   03/11/20 1145 03/12/20 0724 03/12/20 1110  Weight: 70.3 kg 64.9 kg 62.7 kg   Body mass index is 20.41 kg/m.   Gen Exam: More awake-alert-compared to yesterday.  Not in any distress. HEENT:atraumatic, normocephalic Chest: B/L clear to  auscultation anteriorly CVS:S1S2 regular Abdomen:soft non tender, non distended Extremities:no edema Neurology: Moving all 4 extremities. Skin: no rash  I have personally reviewed following labs and imaging studies  LABORATORY DATA: CBC: Recent Labs  Lab 03/08/20 0303 03/09/20 0403 03/10/20 1204 03/11/20 0500 03/12/20 0815  WBC 13.4* 12.6* 11.3* 10.9* 9.7  HGB 7.3* 8.9* 9.9* 9.4* 9.6*  HCT 22.3* 26.4* 30.7* 29.3* 30.3*  MCV 94.9 94.0 96.2 96.1 98.1  PLT 219 225 281 265 960    Basic Metabolic Panel: Recent Labs  Lab 03/07/20 0358 03/07/20 0358 03/08/20 0303 03/08/20 0303 03/08/20 1303 03/09/20 0403 03/10/20 1204 03/11/20 0500 03/12/20 0814  NA 138   < > 136  --   --  134* 135 131* 133*  K 3.7   < > 3.9  --   --  3.9 4.4 4.2 3.3*  CL 95*   < > 93*  --   --  92* 93*  88* 97*  CO2 29   < > 31  --   --  28 28 28 28   GLUCOSE 101*   < > 96   < > 134* 101* 95 90 99  BUN 23   < > 33*  --   --  17 31* 34* 11  CREATININE 5.77*   < > 7.37*  --   --  4.57* 6.63* 7.38* 4.34*  CALCIUM 9.3   < > 9.4  --   --  9.1 9.9 9.5 8.7*  MG 2.1  --  2.1  --   --  2.0  --   --   --   PHOS  --   --  3.2  --   --   --  2.8 2.9 1.9*   < > = values in this interval not displayed.    GFR: Estimated Creatinine Clearance: 10.4 mL/min (A) (by C-G formula based on SCr of 4.34 mg/dL (H)).  Liver Function Tests: Recent Labs  Lab 03/06/20 1437 03/06/20 1437 03/07/20 0358 03/07/20 0358 03/08/20 0303 03/09/20 0403 03/10/20 1204 03/11/20 0500 03/12/20 0814  AST 27  --  27  --  25 26  --   --   --   ALT 13  --  13  --  12 13  --   --   --   ALKPHOS 58  --  57  --  56 54  --   --   --   BILITOT 1.2  --  1.5*  --  1.4* 1.4*  --   --   --   PROT 6.3*  --  6.0*  --  5.8* 5.8*  --   --   --   ALBUMIN 3.4*   < > 3.3*   < > 3.2* 3.0* 3.4* 3.1* 2.9*   < > = values in this interval not displayed.   No results for input(s): LIPASE, AMYLASE in the last 168 hours. Recent Labs  Lab 03/06/20 1437   AMMONIA 18    Coagulation Profile: No results for input(s): INR, PROTIME in the last 168 hours.  Cardiac Enzymes: Recent Labs  Lab 03/06/20 1745  CKTOTAL 181    BNP (last 3 results) No results for input(s): PROBNP in the last 8760 hours.  Lipid Profile: No results for input(s): CHOL, HDL, LDLCALC, TRIG, CHOLHDL, LDLDIRECT in the last 72 hours.  Thyroid Function Tests: No results for input(s): TSH, T4TOTAL, FREET4, T3FREE, THYROIDAB in the last 72 hours.  Anemia Panel: No results for input(s): VITAMINB12, FOLATE, FERRITIN, TIBC, IRON, RETICCTPCT in the last 72 hours.  Urine analysis:    Component Value Date/Time   COLORURINE YELLOW 03/07/2020 1128   APPEARANCEUR HAZY (A) 03/07/2020 1128   LABSPEC 1.008 03/07/2020 1128   PHURINE 9.0 (H) 03/07/2020 1128   GLUCOSEU NEGATIVE 03/07/2020 1128   GLUCOSEU NEGATIVE 01/30/2020 1518   HGBUR SMALL (A) 03/07/2020 1128   BILIRUBINUR NEGATIVE 03/07/2020 1128   KETONESUR NEGATIVE 03/07/2020 1128   PROTEINUR 100 (A) 03/07/2020 1128   UROBILINOGEN 0.2 01/30/2020 1518   NITRITE NEGATIVE 03/07/2020 1128   LEUKOCYTESUR LARGE (A) 03/07/2020 1128    Sepsis Labs: Lactic Acid, Venous    Component Value Date/Time   LATICACIDVEN 0.66 12/08/2016 0605    MICROBIOLOGY: Recent Results (from the past 240 hour(s))  Urine culture     Status: None   Collection Time: 03/06/20  1:11 PM   Specimen: Urine, Random  Result Value Ref Range Status  Specimen Description URINE, RANDOM  Final   Special Requests NONE  Final   Culture   Final    NO GROWTH Performed at [facility], [place] [address], [place], [place] [address]    Report Status 03/08/2020 FINAL  Final  Blood culture (routine x 2)     Status: None   Collection Time: 03/06/20  2:37 PM   Specimen: BLOOD RIGHT FOREARM  Result Value Ref Range Status   Specimen Description BLOOD RIGHT FOREARM  Final   Special Requests   Final    BOTTLES DRAWN AEROBIC AND ANAEROBIC Blood Culture  results may not be optimal due to an inadequate volume of blood received in culture bottles   Culture   Final    NO GROWTH 5 DAYS Performed at [facility], [place] [address], [place], [place] [address]    Report Status 03/11/2020 FINAL  Final  Blood culture (routine x 2)     Status: None   Collection Time: 03/06/20  3:15 PM   Specimen: BLOOD  Result Value Ref Range Status   Specimen Description BLOOD RIGHT ANTECUBITAL  Final   Special Requests   Final    BOTTLES DRAWN AEROBIC AND ANAEROBIC Blood Culture results may not be optimal due to an inadequate volume of blood received in culture bottles   Culture   Final    NO GROWTH 5 DAYS Performed at [facility], [place] [address], [place], [place] [address]    Report Status 03/11/2020 FINAL  Final  Respiratory Panel by RT PCR (Flu A&B, Covid) - Nasopharyngeal Swab     Status: None   Collection Time: 03/06/20 11:15 PM   Specimen: Nasopharyngeal Swab  Result Value Ref Range Status   SARS Coronavirus 2 by RT PCR NEGATIVE NEGATIVE Final    Comment: (NOTE) SARS-CoV-2 target nucleic acids are NOT DETECTED. The SARS-CoV-2 RNA is generally detectable in upper respiratoy specimens during the acute phase of infection. The lowest concentration of SARS-CoV-2 viral copies this assay can detect is 131 copies/mL. A negative result does not preclude SARS-Cov-2 infection and should not be used as the sole basis for treatment or other patient management decisions. A negative result may occur with  improper specimen collection/handling, submission of specimen other than nasopharyngeal swab, presence of viral mutation(s) within the areas targeted by this assay, and inadequate number of viral copies (<131 copies/mL). A negative result must be combined with clinical observations, patient history, and epidemiological information. The expected result is Negative. Fact Sheet for Patients:  PinkCheek.be Fact Sheet for  Healthcare Providers:  GravelBags.it This test is not yet ap proved or cleared by the [place] FDA and  has been authorized for detection and/or diagnosis of SARS-CoV-2 by FDA under an Emergency Use Authorization (EUA). This EUA will remain  in effect (meaning this test can be used) for the duration of the COVID-19 declaration under Section 564(b)(1) of the Act, 21 U.S.C. section 360bbb-3(b)(1), unless the authorization is terminated or revoked sooner.    Influenza A by PCR NEGATIVE NEGATIVE Final   Influenza B by PCR NEGATIVE NEGATIVE Final    Comment: (NOTE) The Xpert Xpress SARS-CoV-2/FLU/RSV assay is intended as an aid in  the diagnosis of influenza from Nasopharyngeal swab specimens and  should not be used as a sole basis for treatment. Nasal washings and  aspirates are unacceptable for Xpert Xpress SARS-CoV-2/FLU/RSV  testing. Fact Sheet for Patients: PinkCheek.be Fact Sheet for Healthcare Providers: GravelBags.it This test is not  yet approved or cleared by the [place] and  has been authorized for detection and/or diagnosis of SARS-CoV-2 by  FDA under an Emergency Use Authorization (EUA). This EUA will remain  in effect (meaning this test can be used) for the duration of the  Covid-19 declaration under Section 564(b)(1) of the Act, 21  U.S.C. section 360bbb-3(b)(1), unless the authorization is  terminated or revoked. Performed at [facility], [place] [address], [place], [place] [address]   SARS CORONAVIRUS 2 (TAT 6-24 HRS) Nasopharyngeal Nasopharyngeal Swab     Status: None   Collection Time: 03/10/20  1:20 PM   Specimen: Nasopharyngeal Swab  Result Value Ref Range Status   SARS Coronavirus 2 NEGATIVE NEGATIVE Final    Comment: (NOTE) SARS-CoV-2 target nucleic acids are NOT DETECTED. The SARS-CoV-2 RNA is generally detectable in upper and lower respiratory specimens  during the acute phase of infection. Negative results do not preclude SARS-CoV-2 infection, do not rule out co-infections with other pathogens, and should not be used as the sole basis for treatment or other patient management decisions. Negative results must be combined with clinical observations, patient history, and epidemiological information. The expected result is Negative. Fact Sheet for Patients: SugarRoll.be Fact Sheet for Healthcare Providers: https://www.woods-mathews.com/ This test is not yet approved or cleared by the [place] FDA and  has been authorized for detection and/or diagnosis of SARS-CoV-2 by FDA under an Emergency Use Authorization (EUA). This EUA will remain  in effect (meaning this test can be used) for the duration of the COVID-19 declaration under Section 56 4(b)(1) of the Act, 21 U.S.C. section 360bbb-3(b)(1), unless the authorization is terminated or revoked sooner. Performed at [facility], [place] [address] [place], [place] [address]   MRSA PCR Screening     Status: None   Collection Time: 03/11/20  3:06 AM   Specimen: Nasal Mucosa; Nasopharyngeal  Result Value Ref Range Status   MRSA by PCR NEGATIVE NEGATIVE Final    Comment:        The GeneXpert MRSA Assay (FDA approved for NASAL specimens only), is one component of a comprehensive MRSA colonization surveillance program. It is not intended to diagnose MRSA infection nor to guide or monitor treatment for MRSA infections. Performed at [facility], [place] [address], [place], [place] [address]     RADIOLOGY STUDIES/RESULTS: DG CHEST PORT 1 VIEW  Result Date: 03/10/2020 CLINICAL DATA:  Altered mental status. EXAM: PORTABLE CHEST 1 VIEW COMPARISON:  Mar 06, 2020. FINDINGS: Stable cardiomegaly. Right internal jugular dialysis catheter is unchanged in position. No pneumothorax is noted. Mild bibasilar opacities are noted concerning for  atelectasis or edema with associated pleural effusions. Bony thorax is unremarkable. Atherosclerosis of thoracic aorta is noted. IMPRESSION: Mild bibasilar opacities are noted concerning for atelectasis or edema with associated pleural effusions. Aortic Atherosclerosis (ICD10-I70.0). Electronically Signed   By: Marijo Conception M.D.   On: 03/10/2020 18:17     LOS: 1 day   Oren Binet, MD  [facility]    To contact the attending provider between 7A-7P or the covering provider during after hours 7P-7A, please log into the web site [email or web address] and access using universal [place] password for that web site. If you do not have the password, please call the hospital operator.  03/12/2020, 2:11 PM

## 2020-03-12 NOTE — Plan of Care (Signed)
  Problem: Safety: Goal: Ability to remain free from injury will improve Outcome: Progressing   

## 2020-03-12 NOTE — Progress Notes (Signed)
Hypoglycemic Event  CBG: 60   Treatment: juice   Symptoms: None  Follow-up CBG: Time:1240 CBG Result:60 recheck CBG 76 at 1314   Possible Reasons for Event:missed breakfast  Comments/MD notified yes  Gave 50% dextrose wil;l recheck and continue to monitor patient     [place]

## 2020-03-13 ENCOUNTER — Inpatient Hospital Stay (HOSPITAL_COMMUNITY): Payer: Medicare Other

## 2020-03-13 DIAGNOSIS — E162 Hypoglycemia, unspecified: Principal | ICD-10-CM

## 2020-03-13 LAB — BODY FLUID CELL COUNT WITH DIFFERENTIAL
Eos, Fluid: 0 %
Lymphs, Fluid: 56 %
Monocyte-Macrophage-Serous Fluid: 36 % — ABNORMAL LOW (ref 50–90)
Neutrophil Count, Fluid: 8 % (ref 0–25)
Total Nucleated Cell Count, Fluid: 270 cu mm (ref 0–1000)

## 2020-03-13 LAB — PROTEIN, TOTAL: Total Protein: 6.3 g/dL — ABNORMAL LOW (ref 6.5–8.1)

## 2020-03-13 LAB — BASIC METABOLIC PANEL
Anion gap: 13 (ref 5–15)
BUN: 16 mg/dL (ref 8–23)
CO2: 28 mmol/L (ref 22–32)
Calcium: 9.5 mg/dL (ref 8.9–10.3)
Chloride: 96 mmol/L — ABNORMAL LOW (ref 98–111)
Creatinine, Ser: 3.17 mg/dL — ABNORMAL HIGH (ref 0.61–1.24)
GFR calc Af Amer: 19 mL/min — ABNORMAL LOW (ref >60)
GFR calc non Af Amer: 17 mL/min — ABNORMAL LOW (ref >60)
Glucose, Bld: 100 mg/dL — ABNORMAL HIGH (ref 70–99)
Potassium: 3.8 mmol/L (ref 3.5–5.1)
Sodium: 137 mmol/L (ref 135–145)

## 2020-03-13 LAB — ALBUMIN, PLEURAL OR PERITONEAL FLUID: Albumin, Fluid: 1.8 g/dL

## 2020-03-13 LAB — LACTATE DEHYDROGENASE: LDH: 356 U/L — ABNORMAL HIGH (ref 98–192)

## 2020-03-13 LAB — GLUCOSE, CAPILLARY
Glucose-Capillary: 77 mg/dL (ref 70–99)
Glucose-Capillary: 74 mg/dL (ref 70–99)
Glucose-Capillary: 14 mg/dL — CL (ref 70–99)
Glucose-Capillary: 94 mg/dL (ref 70–99)
Glucose-Capillary: 99 mg/dL (ref 70–99)
Glucose-Capillary: 91 mg/dL (ref 70–99)

## 2020-03-13 LAB — PHOSPHORUS: Phosphorus: 2.2 mg/dL — ABNORMAL LOW (ref 2.5–4.6)

## 2020-03-13 MED ORDER — POTASSIUM CHLORIDE CRYS ER 20 MEQ PO TBCR
20.0000 meq | EXTENDED_RELEASE_TABLET | Freq: Once | ORAL | Status: AC
  Administered 2020-03-13: 20 meq via ORAL
  Filled 2020-03-13: qty 1

## 2020-03-13 MED ORDER — MAGNESIUM SULFATE 2 GM/50ML IV SOLN
2.0000 g | Freq: Once | INTRAVENOUS | Status: AC
  Administered 2020-03-13: 2 g via INTRAVENOUS
  Filled 2020-03-13: qty 50

## 2020-03-13 MED ORDER — COSYNTROPIN 0.25 MG IJ SOLR
0.2500 mg | Freq: Once | INTRAMUSCULAR | Status: AC
  Administered 2020-03-14: 0.25 mg via INTRAVENOUS
  Filled 2020-03-13: qty 0.25

## 2020-03-13 MED ORDER — K PHOS MONO-SOD PHOS DI & MONO 155-852-130 MG PO TABS
250.0000 mg | ORAL_TABLET | Freq: Once | ORAL | Status: AC
  Administered 2020-03-13: 250 mg via ORAL
  Filled 2020-03-13: qty 1

## 2020-03-13 NOTE — Consult Note (Addendum)
NAME:  Gabriel Laymon Funaro Sr., MRN:  [id number], DOB:  07-18-1929, LOS: 2 ADMISSION DATE:  03/10/2020, CONSULTATION DATE: 03/13/2020 REFERRING MD:  Adella Nissen MD, CHIEF COMPLAINT: Pleural effusion  Brief History   86 year old with end-stage renal disease, hyperlipidemia, depression/dementia admitted for encephalopathy, UTI with persistent right greater than left effusion.  PCCM consulted on 5/8 for thoracentesis.  Past Medical History    has a past medical history of ANEMIA-IRON DEFICIENCY (03/09/2008), Arthritis, Complex renal cyst (06/19/2011), Depression (03/26/2014), DIVERTICULOSIS, COLON (03/09/2008), ESRD (end stage renal disease) ([place]) (03/09/2008), Flu (12/08/2016), GOUT (03/09/2008), Hemorrhoids (08/2009), History of blood transfusion, HYPERLIPIDEMIA (03/09/2008), Hyperparathyroidism ([place]), HYPERTENSION (03/09/2008), Prostate cancer ([place]) (2002), PROSTATE CANCER, HX OF (03/09/2008), Radiation cystitis (2010.), and Stroke [facility]).  Significant Hospital Events   5/8 Admit  Consults:  PCCM  Procedures:  CT chest 03/13/2020-bilateral pleural effusions right greater than left with compressive atelectasis.  Overall increased since 5/1.  Significant Diagnostic Tests:    Micro Data:    Antimicrobials:    Interim history/subjective:    Objective   Blood pressure (!) 125/53, pulse 82, temperature 98 F (36.7 C), temperature source Oral, resp. rate 20, height 5\' 9"  (1.753 m), weight 62.7 kg, SpO2 100 %.        Intake/Output Summary (Last 24 hours) at 03/13/2020 1553 Last data filed at 03/13/2020 1346 Gross per 24 hour  Intake 2606.51 ml  Output 0 ml  Net 2606.51 ml   Filed Weights   03/11/20 1145 03/12/20 0724 03/12/20 1110  Weight: 70.3 kg 64.9 kg 62.7 kg    Examination: Gen:   Elderly man, pleasant, confused HEENT:  EOMI, sclera anicteric Neck:     No masses; no thyromegaly Lungs:    Diminished breath sounds at the base CV:         Regular rate and rhythm; no murmurs Abd:      + bowel  sounds; soft, non-tender; no palpable masses, no distension Ext:    No edema; adequate peripheral perfusion Skin:      Warm and dry; no rash Neuro: Awake, responds to commands.  Resolved Hospital Problem list     Assessment & Plan:  Bilateral pleural effusions Likely secondary to volume overload.  CT with no significant underlying consolidation Thoracentesis today on the right.  Discussed procedure including risk/benefit on the phone with his wife who has given consent. Send fluid for microbiology, cytology, LDH, protein, albumin  Labs   CBC: Recent Labs  Lab 03/08/20 0303 03/09/20 0403 03/10/20 1204 03/11/20 0500 03/12/20 0815  WBC 13.4* 12.6* 11.3* 10.9* 9.7  HGB 7.3* 8.9* 9.9* 9.4* 9.6*  HCT 22.3* 26.4* 30.7* 29.3* 30.3*  MCV 94.9 94.0 96.2 96.1 98.1  PLT 219 225 281 265 379    Basic Metabolic Panel: Recent Labs  Lab 03/07/20 0358 03/07/20 0358 03/08/20 0303 03/08/20 1303 03/09/20 0403 03/10/20 1204 03/11/20 0500 03/12/20 0814 03/13/20 0442  NA 138   < > 136  --  134* 135 131* 133* 137  K 3.7   < > 3.9  --  3.9 4.4 4.2 3.3* 3.8  CL 95*   < > 93*  --  92* 93* 88* 97* 96*  CO2 29   < > 31  --  28 28 28 28 28   GLUCOSE 101*   < > 96   < > 101* 95 90 99 100*  BUN 23   < > 33*  --  17 31* 34* 11 16  CREATININE 5.77*   < >  7.37*  --  4.57* 6.63* 7.38* 4.34* 3.17*  CALCIUM 9.3   < > 9.4  --  9.1 9.9 9.5 8.7* 9.5  MG 2.1  --  2.1  --  2.0  --   --   --   --   PHOS  --   --  3.2  --   --  2.8 2.9 1.9* 2.2*   < > = values in this interval not displayed.   GFR: Estimated Creatinine Clearance: 14.3 mL/min (A) (by C-G formula based on SCr of 3.17 mg/dL (H)). Recent Labs  Lab 03/07/20 0900 03/08/20 0303 03/09/20 0403 03/10/20 1204 03/11/20 0500 03/12/20 0815  PROCALCITON 1.11  --   --   --   --   --   WBC  --    < > 12.6* 11.3* 10.9* 9.7   < > = values in this interval not displayed.    Liver Function Tests: Recent Labs  Lab 03/07/20 0358 03/07/20 0358  03/08/20 0303 03/09/20 0403 03/10/20 1204 03/11/20 0500 03/12/20 0814  AST 27  --  25 26  --   --   --   ALT 13  --  12 13  --   --   --   ALKPHOS 57  --  56 54  --   --   --   BILITOT 1.5*  --  1.4* 1.4*  --   --   --   PROT 6.0*  --  5.8* 5.8*  --   --   --   ALBUMIN 3.3*   < > 3.2* 3.0* 3.4* 3.1* 2.9*   < > = values in this interval not displayed.   No results for input(s): LIPASE, AMYLASE in the last 168 hours. No results for input(s): AMMONIA in the last 168 hours.  ABG    Component Value Date/Time   TCO2 30 11/27/2019 1045     Coagulation Profile: No results for input(s): INR, PROTIME in the last 168 hours.  Cardiac Enzymes: Recent Labs  Lab 03/06/20 1745  CKTOTAL 181    HbA1C: Hgb A1c MFr Bld  Date/Time Value Ref Range Status  03/06/2020 05:45 PM 5.5 4.8 - 5.6 % Final    Comment:    (NOTE) Pre diabetes:          5.7%-6.4% Diabetes:              >6.4% Glycemic control for   <7.0% adults with diabetes   07/05/2015 05:40 AM 4.9 4.8 - 5.6 % Final    Comment:    (NOTE)         Pre-diabetes: 5.7 - 6.4         Diabetes: >6.4         Glycemic control for adults with diabetes: <7.0     CBG: Recent Labs  Lab 03/12/20 2331 03/13/20 0321 03/13/20 0739 03/13/20 1133 03/13/20 1148  GLUCAP 98 77 74 14* 94    Review of Systems:   Unable to obtain due to dementia  Past Medical History  He,  has a past medical history of ANEMIA-IRON DEFICIENCY (03/09/2008), Arthritis, Complex renal cyst (06/19/2011), Depression (03/26/2014), DIVERTICULOSIS, COLON (03/09/2008), ESRD (end stage renal disease) ([place]) (03/09/2008), Flu (12/08/2016), GOUT (03/09/2008), Hemorrhoids (08/2009), History of blood transfusion, HYPERLIPIDEMIA (03/09/2008), Hyperparathyroidism ([place]), HYPERTENSION (03/09/2008), Prostate cancer ([place]) (2002), PROSTATE CANCER, HX OF (03/09/2008), Radiation cystitis (2010.), and Stroke ([place]).   Surgical History    Past Surgical History:  Procedure Laterality Date  . AV  FISTULA PLACEMENT Left 01/24/2013   Procedure: INSERTION OF ARTERIOVENOUS (AV) GORE-TEX GRAFT ARM;  Surgeon: Rosetta Posner, MD;  Location: [place];  Service: Vascular;  Laterality: Left;  . COLONOSCOPY N/A 06/17/2015   Procedure: COLONOSCOPY;  Surgeon: Gatha Mayer, MD;  Location: [place];  Service: Endoscopy;  Laterality: N/A;  . ESOPHAGOGASTRODUODENOSCOPY N/A 06/17/2015   Procedure: ESOPHAGOGASTRODUODENOSCOPY (EGD);  Surgeon: Gatha Mayer, MD;  Location: [facility] ENDOSCOPY;  Service: Endoscopy;  Laterality: N/A;  . FLEXIBLE SIGMOIDOSCOPY N/A 06/15/2015   Procedure: FLEXIBLE SIGMOIDOSCOPY;  Surgeon: Gatha Mayer, MD;  Location: [place];  Service: Endoscopy;  Laterality: N/A;  . GIVENS CAPSULE STUDY N/A 07/06/2015   Procedure: GIVENS CAPSULE STUDY;  Surgeon: Milus Banister, MD;  Location: [place];  Service: Endoscopy;  Laterality: N/A;  . JOINT REPLACEMENT     HIP  . rotator cuff repair right  4/08  . s/p left hip replacement  2007   Dr. Percell Miller ortho  . TONSILLECTOMY       Social History   reports that he quit smoking about 37 years ago. His smoking use included cigarettes. He quit after 5.00 years of use. He has never used smokeless tobacco. He reports that he does not drink alcohol or use drugs.   Family History   His family history includes Cancer in his brother and father; Diabetes in his father; Hypertension in his brother and father.   Allergies No Known Allergies   Home Medications  Prior to Admission medications   Medication Sig Start Date End Date Taking? Authorizing Provider  allopurinol (ZYLOPRIM) 100 MG tablet TAKE 1 TABLET BY MOUTH EVERY DAY Patient taking differently: Take 100 mg by mouth daily.  03/03/20   [name], MD  amoxicillin-clavulanate (AUGMENTIN) 500-125 MG tablet Take 1 tablet (500 mg total) by mouth at bedtime for 5 days. 03/09/20 03/14/20  Damita Lack, MD  aspirin EC 81 MG tablet Take 1 tablet (81 mg total) by mouth daily. 12/12/16   Rosalin Hawking, MD  AURYXIA 1 GM 210 MG(Fe) tablet Take 420 mg by mouth 3 (three) times daily with meals.  04/30/18   [provider]  calcitRIOL (ROCALTROL) 0.5 MCG capsule Take 3 capsules (1.5 mcg total) by mouth every Monday, Wednesday, and Friday with hemodialysis. 03/10/20 04/09/20  Damita Lack, MD  cinacalcet (SENSIPAR) 30 MG tablet Take 1 tablet (30 mg total) by mouth every Monday, Wednesday, and Friday with hemodialysis. 03/10/20 04/09/20  Amin, Jeanella Flattery, MD  citalopram (CELEXA) 10 MG tablet Take 1 tablet (10 mg total) by mouth daily. 01/12/20   [name], MD  Darbepoetin Alfa (ARANESP) 60 MCG/0.3ML SOSY injection Inject 0.3 mLs (60 mcg total) into the vein every Monday with hemodialysis. 03/15/20   Amin, Ankit Chirag, MD  latanoprost (XALATAN) 0.005 % ophthalmic solution Place 1 drop into both eyes at bedtime.  05/05/18   [provider]  lidocaine-prilocaine (EMLA) cream Apply 1 application topically See admin instructions. Apply to access site 1 hour before dialysis. Cover with saran wrap 06/07/18   [provider]  midodrine (PROAMATINE) 10 MG tablet Take 1 tablet (10 mg total) by mouth 3 (three) times daily. Take in the morning, lunch time and 4pm. Do not take after 4pm. 12/15/19   [name], MD  Multiple Vitamin (MULTIVITAMIN WITH MINERALS) TABS tablet Take 1 tablet by mouth daily.    [provider]  nystatin (MYCOSTATIN/NYSTOP) powder Use as directed twice per day Patient taking  differently: Apply 1 g topically 2 (two) times daily as needed (rash).  07/06/16   [name], MD  rosuvastatin (CRESTOR) 20 MG tablet Take 1 tablet (20 mg total) by mouth at bedtime. 12/15/19   [name], MD  Vitamin D, Ergocalciferol, (DRISDOL) 1.25 MG (50000 UNIT) CAPS capsule Take 1 capsule (50,000 Units total) by mouth every 7 (seven) days. 12/04/19   [name], MD     Marshell Garfinkel MD [facility] Please see [email or web address] for pager details.    03/13/2020, 4:48 PM

## 2020-03-13 NOTE — Procedures (Addendum)
Right Thoracentesis Procedure Note  Pre-operative Diagnosis: Right pleural effusion  Post-operative Diagnosis: same  Indications: Dyspnea  Procedure Details  Consent: Informed consent was obtained. Risks of the procedure were discussed including: infection, bleeding, pain, pneumothorax.  The fluid was first localized with an ultrasound and marked.  Right greater than left pleural effusion noted  Rt effusion   Under sterile conditions the patient was positioned. Betadine solution and sterile drapes were utilized.  2% buffered lidocaine was used to anesthetize the right posterior rib space. Fluid was obtained without any difficulties and minimal blood loss.  A dressing was applied to the wound and wound care instructions were provided.   Findings 600 ml of cloudy straw colored pleural fluid was obtained. A sample was sent to Pathology for cytogenetics, flow, and cell counts, as well as for infection analysis.  Complications:  None; patient tolerated the procedure well.          Condition: stable    Plan A follow up chest x-ray was ordered. Bed Rest for 2 hours. Tylenol 650 mg. for pain.  Marshell Garfinkel MD [place] Please see [email or web address] for pager details.  03/13/2020, 4:52 PM

## 2020-03-13 NOTE — Progress Notes (Addendum)
[facility] Progress Note   Subjective:   Patient seen and examined at bedside.  States "I feel good".  Denies complaints.  Hypoglycemic events overnight, likely do to poor intake.   Objective Vitals:   03/12/20 1828 03/12/20 2010 03/13/20 0331 03/13/20 0927  BP:  100/66 (!) 147/68 (!) 125/53  Pulse:  68 60 82  Resp:  18 18 20   Temp: 97.8 F (36.6 C) 97.9 F (36.6 C) 98.5 F (36.9 C) 98 F (36.7 C)  TempSrc: Oral Oral Oral Oral  SpO2:  97% 97% 100%  Weight:      Height:       Physical Exam General:Pleasantly demented elderly male in NAD Heart:RRR Lungs:CTAB, nml WOB Abdomen:soft, NTND Extremities:no edema, bruising on L thigh/hip, +tenderness to palpation L hip, L elbow swelling & bruising Dialysis Access: [facility]   Filed Weights   03/11/20 1145 03/12/20 0724 03/12/20 1110  Weight: 70.3 kg 64.9 kg 62.7 kg    Intake/Output Summary (Last 24 hours) at 03/13/2020 1051 Last data filed at 03/13/2020 1013 Gross per 24 hour  Intake 1836.16 ml  Output 2000 ml  Net -163.84 ml    Additional Objective Labs: Basic Metabolic Panel: Recent Labs  Lab 03/11/20 0500 03/12/20 0814 03/13/20 0442  NA 131* 133* 137  K 4.2 3.3* 3.8  CL 88* 97* 96*  CO2 28 28 28   GLUCOSE 90 99 100*  BUN 34* 11 16  CREATININE 7.38* 4.34* 3.17*  CALCIUM 9.5 8.7* 9.5  PHOS 2.9 1.9* 2.2*   Liver Function Tests: Recent Labs  Lab 03/07/20 0358 03/07/20 0358 03/08/20 0303 03/08/20 0303 03/09/20 0403 03/09/20 0403 03/10/20 1204 03/11/20 0500 03/12/20 0814  AST 27  --  25  --  26  --   --   --   --   ALT 13  --  12  --  13  --   --   --   --   ALKPHOS 57  --  56  --  54  --   --   --   --   BILITOT 1.5*  --  1.4*  --  1.4*  --   --   --   --   PROT 6.0*  --  5.8*  --  5.8*  --   --   --   --   ALBUMIN 3.3*   < > 3.2*   < > 3.0*   < > 3.4* 3.1* 2.9*   < > = values in this interval not displayed.   CBC: Recent Labs  Lab 03/08/20 0303 03/08/20 0303 03/09/20 0403  03/09/20 0403 03/10/20 1204 03/11/20 0500 03/12/20 0815  WBC 13.4*   < > 12.6*   < > 11.3* 10.9* 9.7  HGB 7.3*   < > 8.9*   < > 9.9* 9.4* 9.6*  HCT 22.3*   < > 26.4*   < > 30.7* 29.3* 30.3*  MCV 94.9  --  94.0  --  96.2 96.1 98.1  PLT 219   < > 225   < > 281 265 280   < > = values in this interval not displayed.   Blood Culture    Component Value Date/Time   SDES BLOOD RIGHT ANTECUBITAL 03/06/2020 1515   SPECREQUEST  03/06/2020 1515    BOTTLES DRAWN AEROBIC AND ANAEROBIC Blood Culture results may not be optimal due to an inadequate volume of blood received in culture bottles   CULT  03/06/2020 1515    NO  GROWTH 5 DAYS Performed at [facility], [place] [address], [place], [place] [address]    REPTSTATUS 03/11/2020 FINAL 03/06/2020 1515    CBG: Recent Labs  Lab 03/12/20 1825 03/12/20 2045 03/12/20 2331 03/13/20 0321 03/13/20 0739  GLUCAP 81 119* 98 77 74   Studies/Results: CT CHEST WO CONTRAST  Result Date: 03/13/2020 CLINICAL DATA:  Bilateral pleural effusions, abnormal chest x-ray EXAM: CT CHEST WITHOUT CONTRAST TECHNIQUE: Multidetector CT imaging of the chest was performed following the standard protocol without IV contrast. COMPARISON:  03/06/2020 FINDINGS: Cardiovascular: Heart is stable without pericardial effusion. Extensive calcification of the mitral and aortic valves. Stable atherosclerosis of the thoracic aorta and coronary vessels. Mediastinum/Nodes: No enlarged mediastinal or axillary lymph nodes. Thyroid gland, trachea, and esophagus demonstrate no significant findings. Lungs/Pleura: There are small bilateral pleural effusions, right greater than left. Overall, slight increased since prior study, estimated less than 500 cc on the left and less than 1 L on the right. Dependent atelectasis within the bilateral lower lobes. No airspace disease or pneumothorax. The central airways are patent. Upper Abdomen: Stable appearance of the upper abdomen. Musculoskeletal: No  acute or destructive bony lesions. Reconstructed images demonstrate no additional findings. There is a right internal jugular catheter tip within the superior vena cava. IMPRESSION: 1. Slight enlargement of bilateral pleural effusions, right greater than left. 2. Compressive atelectasis bilateral lower lobes. 3.  Aortic Atherosclerosis (ICD10-I70.0). Electronically Signed   By: Randa Ngo M.D.   On: 03/13/2020 00:32    Medications: . cefTRIAXone (ROCEPHIN)  IV 2 g (03/12/20 1742)  . dextrose 50 mL/hr at 03/12/20 1107   . allopurinol  100 mg Oral Daily  . calcitRIOL  1.5 mcg Oral Q M,W,F-HD  . Chlorhexidine Gluconate Cloth  6 each Topical Q0600  . cinacalcet  30 mg Oral Q M,W,F-HD  . citalopram  10 mg Oral Daily  . [START ON 03/14/2020] cosyntropin  0.25 mg Intravenous Once  . darbepoetin (ARANESP) injection - DIALYSIS  60 mcg Intravenous Q Thu-HD  . feeding supplement (NEPRO CARB STEADY)  237 mL Oral TID BM  . feeding supplement (PRO-STAT SUGAR FREE 64)  30 mL Oral BID  . BioGaia Probiotic  5 drop Oral Q2000  . latanoprost  1 drop Both Eyes QHS  . midodrine  10 mg Oral TID  . multivitamin  1 tablet Oral QHS  . phosphorus  250 mg Oral Once  . rosuvastatin  20 mg Oral QHS    Dialysis Orders: [facility]- MWF 4 hours, 400 BFR via TDC EDW 66- 2 K, 2 calc bath. No heparin  Calcitriol 1.5 mcg and sensipar 30 q HD, mircera 30  Assessment/Plan: 1.Confusion- waxing and waning, baseline dementia, hypoglycemic on arrival, +UTI, recent SDH. Rocephin given.  2.Hypoglycemia- per primary  3. ESRD-on HD MWF. HD yesterday tolerated well. No Heparin. Next HD 5/10. K 3.8 today, use 4K bath.  4. Anemiaof CKD-last Hgb 9.6. ESA dosed on 5/3 5. Secondary hyperparathyroidism- Ca at goal. Phos improved to 2.2 s/p IV repletion. Hold binders. Continue sensipar and VDRA. 6.HTN/volume- Blood pressure controlled. Does not appear volume overloaded. Titrate down volume as tolerated. Request  standing or hoyer weight to better assess edw, difference of almost 20kg recorded.  UF removed yesterday 2L. If weights correct today, under EDW, likely need to lower at d/c.  7. Nutrition- Renal diet w/fluid restrictions.  Jen Mow, PA-C [facility] Pager: [phone] 03/13/2020,10:51 AM  LOS: 2 days    Nephrology attending: Seen  and examined.  Chart reviewed.  I agree with assessment and plan as outlined above. He had dialysis yesterday and tolerated well.  Plan for next HD on 5/10.  History of dementia and superimposed encephalopathy probably contributed by hypoglycemia and possible UTI. Continue ESA for anemia. Received phosphorus repletion for hypophosphatemia.  Off of binders.  Encourage oral intake.  Continue vitamin D. PT OT evaluation and possibly DC to SNF.  Katheran Dalessandro, MD [facility].

## 2020-03-13 NOTE — Progress Notes (Signed)
eLink Physician-Brief Progress Note Patient Name: Gabriel Seyler Jarrard Sr. DOB: [birth year] MRN: [id number]   Date of Service  03/13/2020  HPI/Events of Note  Requested to follow up CXR post-thoracentesis earlier today.   eICU Interventions  CXR reviewed. Improvement in right sided layering effusion seen on prior CXR from 3 days ago. No pneumothorax.     Intervention Category Minor Interventions: Other:  Charlott Rakes 03/13/2020, 7:26 PM

## 2020-03-13 NOTE — Plan of Care (Signed)
  Problem: Clinical Measurements: Goal: Respiratory complications will improve Outcome: Progressing   Problem: Nutrition: Goal: Adequate nutrition will be maintained Outcome: Progressing   

## 2020-03-13 NOTE — TOC Initial Note (Signed)
Transition of Care (TOC) - Initial/Assessment Note    Patient Details  Name: Gabriel Hausmann Duque Sr. MRN: [id number] Date of Birth: 26-Sep-1929  Transition of [facility] CM/SW Contact:    Benard Halsted, LCSW Phone Number: 03/13/2020, 11:56 AM  Clinical Narrative:                 CSW received consult for possible SNF placement at time of discharge. CSW spoke with patient's son regarding PT recommendation of SNF placement at time of discharge. Patient's son reported that patient resides with him and patient's wife and reports patient has had a recent decline. Family may currently be unable to care for patient at their home given patient's current physical needs and fall risk. Patient's son expressed understanding of PT recommendation and is agreeable to SNF placement at time of discharge if it is needed. He requests therapy continue to work with patient to mobilize him. Patient's son expressed being hopeful for rehab and to feel better soon. Will provide bed offers for SNFs able to transport patient to dialysis once available. No further questions reported at this time. CSW to continue to follow and assist with discharge planning needs.   Expected Discharge Plan: Skilled Nursing Facility Barriers to Discharge: Continued Medical Work up   Patient Goals and CMS Choice Patient states their goals for this hospitalization and ongoing recovery are:: Get stronger CMS Medicare.gov Compare Post Acute Care list provided to:: Patient Represenative (must comment)(Son) Choice offered to / list presented to : Adult Children  Expected Discharge Plan and Services Expected Discharge Plan: [place] In-house Referral: Clinical Social Work   Post Acute Care Choice: [place] Living arrangements for the past 2 months: [place]                                      Prior Living Arrangements/Services Living arrangements for the past 2 months: Single Family  Home Lives with:: Adult Children, Spouse Patient language and need for interpreter reviewed:: Yes Do you feel safe going back to the place where you live?: Yes      Need for Family Participation in Patient Care: Yes (Comment) Care giver support system in place?: Yes (comment) Current home services: DME Criminal Activity/Legal Involvement Pertinent to Current Situation/Hospitalization: No - Comment as needed  Activities of Daily Living      Permission Sought/Granted Permission sought to share information with : Facility Sport and exercise psychologist, Family Supports Permission granted to share information with : No  Share Information with NAME: Rain  Permission granted to share info w AGENCY: SNFs  Permission granted to share info w Relationship: Son  Permission granted to share info w Contact Information: [phone]  Emotional Assessment Appearance:: Appears stated age Attitude/Demeanor/Rapport: Unable to Assess Affect (typically observed): Unable to Assess Orientation: : Oriented to Self Alcohol / Substance Use: Not Applicable Psych Involvement: No (comment)  Admission diagnosis:  Hypoglycemia [E16.2] PNA (pneumonia) [J18.9] Multifocal pneumonia [J18.9] Patient Active Problem List   Diagnosis Date Noted  . Hypoglycemia 03/10/2020  . Acute lower UTI 03/10/2020  . Falls 03/06/2020  . General weakness 03/06/2020  . Subdural hematoma ([place]) 03/06/2020  . Vitamin D deficiency 01/10/2020  . Benign neoplasm of scrotum 01/08/2020  . Bilateral hearing loss due to cerumen impaction 03/17/2019  . Irregular heart rhythm 10/10/2018  . Left flank pain 01/10/2018  . Influenza A 12/08/2016  . Elevated troponin  12/08/2016  . Anemia of chronic disease 10/04/2016  . Orthostatic hypotension 09/07/2016  . Mass of skin of finger of left hand 10/11/2015  . ESRD on dialysis ([place]) 09/09/2015  . Cerebrovascular accident (CVA) due to thrombosis of right middle cerebral artery ([place]) 09/09/2015  .  History of GI bleed   . HLD (hyperlipidemia)   . Benign neoplasm of descending colon   . Gastritis   . Syncope 06/15/2015  . Diverticulosis of colon with hemorrhage   . Low back pain 03/26/2014  . Depression 03/26/2014  . Complex renal cyst 06/19/2011  . Preventative health care 06/17/2011  . SHOULDER PAIN, LEFT 06/03/2010  . Gout 03/09/2008  . Iron deficiency anemia 03/09/2008  . Essential hypertension 03/09/2008  . FATIGUE 03/09/2008  . PROSTATE CANCER, HX OF 03/09/2008   PCP:  [name], MD Pharmacy:   [id number] - [place], [place] [address] [place] [address] Phone: [phone] Fax: [phone]     Social Determinants of Health (SDOH) Interventions    Readmission Risk Interventions No flowsheet data found.

## 2020-03-13 NOTE — Progress Notes (Signed)
CSW left voicemail for patient's son regarding SNF recommendation.   Chrislynn Mosely LCSW

## 2020-03-13 NOTE — NC FL2 (Signed)
[place] LEVEL OF CARE SCREENING TOOL     IDENTIFICATION  Patient Name: Gabriel Frigon Carvell Sr. Birthdate: 06-04-1929 Sex: male Admission Date (Current Location): 03/10/2020  [facility] and [place] Number:  Herbalist and Address:  [facility] [place] [address] [place], [place] [address]      Provider Number: [id number]  Attending Physician Name and Address:  Jonetta Osgood, MD  Relative Name and Phone Number:  [name], spouse, [phone]/ Musab, son [phone]    Current Level of Care: Hospital Recommended Level of Care: [place] Prior Approval Number:    Date Approved/Denied:   PASRR Number: [id number]  Discharge Plan: SNF    Current Diagnoses: Patient Active Problem List   Diagnosis Date Noted  . Hypoglycemia 03/10/2020  . Acute lower UTI 03/10/2020  . Falls 03/06/2020  . General weakness 03/06/2020  . Subdural hematoma ([place]) 03/06/2020  . Vitamin D deficiency 01/10/2020  . Benign neoplasm of scrotum 01/08/2020  . Bilateral hearing loss due to cerumen impaction 03/17/2019  . Irregular heart rhythm 10/10/2018  . Left flank pain 01/10/2018  . Influenza A 12/08/2016  . Elevated troponin 12/08/2016  . Anemia of chronic disease 10/04/2016  . Orthostatic hypotension 09/07/2016  . Mass of skin of finger of left hand 10/11/2015  . ESRD on dialysis ([place]) 09/09/2015  . Cerebrovascular accident (CVA) due to thrombosis of right middle cerebral artery ([place]) 09/09/2015  . History of GI bleed   . HLD (hyperlipidemia)   . Benign neoplasm of descending colon   . Gastritis   . Syncope 06/15/2015  . Diverticulosis of colon with hemorrhage   . Low back pain 03/26/2014  . Depression 03/26/2014  . Complex renal cyst 06/19/2011  . Preventative health care 06/17/2011  . SHOULDER PAIN, LEFT 06/03/2010  . Gout 03/09/2008  . Iron deficiency anemia 03/09/2008  . Essential hypertension 03/09/2008  . FATIGUE 03/09/2008   . PROSTATE CANCER, HX OF 03/09/2008    Orientation RESPIRATION BLADDER Height & Weight     Self  Normal Continent Weight: 138 lb 3.7 oz (62.7 kg) Height:  5\' 9"  (175.3 cm)  BEHAVIORAL SYMPTOMS/MOOD NEUROLOGICAL BOWEL NUTRITION STATUS  (Dementia)   Incontinent Diet(Please see DC Summary)  AMBULATORY STATUS COMMUNICATION OF NEEDS Skin   Extensive Assist Verbally Normal                       Personal Care Assistance Level of Assistance  Bathing, Feeding, Dressing Bathing Assistance: Maximum assistance Feeding assistance: Independent Dressing Assistance: Maximum assistance     Functional Limitations Info  Sight, Hearing, Speech Sight Info: Impaired Hearing Info: Adequate Speech Info: Adequate    SPECIAL CARE FACTORS FREQUENCY  PT (By licensed PT), OT (By licensed OT)     PT Frequency: 5x/week OT Frequency: 5x/week            Contractures      Additional Factors Info  Code Status, Allergies, Psychotropic Code Status Info: Full Allergies Info: NKA Psychotropic Info: Celexa         Current Medications (03/13/2020):  This is the current hospital active medication list Current Facility-Administered Medications  Medication Dose Route Frequency Provider Last Rate Last Admin  . acetaminophen (TYLENOL) tablet 650 mg  650 mg Oral Q6H PRN Fuller Plan A, MD       Or  . acetaminophen (TYLENOL) suppository 650 mg  650 mg Rectal Q6H PRN Norval Morton, MD      .  albuterol (PROVENTIL) (2.5 MG/3ML) 0.083% nebulizer solution 2.5 mg  2.5 mg Nebulization Q6H PRN Smith, Rondell A, MD      . allopurinol (ZYLOPRIM) tablet 100 mg  100 mg Oral Daily Smith, Rondell A, MD   100 mg at 03/13/20 1001  . calcitRIOL (ROCALTROL) capsule 1.5 mcg  1.5 mcg Oral Q M,W,F-HD Fuller Plan A, MD   1.5 mcg at 03/12/20 1215  . cefTRIAXone (ROCEPHIN) 2 g in sodium chloride 0.9 % 100 mL IVPB  2 g Intravenous Q24H Smith, Rondell A, MD 200 mL/hr at 03/12/20 1742 2 g at 03/12/20 1742  .  Chlorhexidine Gluconate Cloth 2 % PADS 6 each  6 each Topical Q0600 Pen[place], [place], [place]   6 each at 03/13/20 [id number]  . cinacalcet (SENSIPAR) tablet 30 mg  30 mg Oral Q M,W,F-HD Fuller Plan A, MD   30 mg at 03/12/20 1008  . citalopram (CELEXA) tablet 10 mg  10 mg Oral Daily Tamala Julian, Rondell A, MD   10 mg at 03/13/20 1001  . [START ON 03/14/2020] cosyntropin (CORTROSYN) injection 0.25 mg  0.25 mg Intravenous Once Ghimire, Henreitta Leber, MD      . Darbepoetin Alfa (ARANESP) injection 60 mcg  60 mcg Intravenous Q Thu-HD Corliss Parish, MD   60 mcg at 03/11/20 1001  . dextrose 5 % solution   Intravenous Continuous Fuller Plan A, MD 50 mL/hr at 03/12/20 1107 New Bag at 03/12/20 1107  . feeding supplement (NEPRO CARB STEADY) liquid 237 mL  237 mL Oral TID BM Roney Jaffe, MD   237 mL at 03/13/20 1013  . feeding supplement (PRO-STAT SUGAR FREE 64) liquid 30 mL  30 mL Oral BID Jonetta Osgood, MD   30 mL at 03/13/20 1002  . lactobacillus rheuteri (BIOGAIA/GERBER SOOTHE) probiotic liquid  5 drop Oral Q2000 Fuller Plan A, MD   5 drop at 03/12/20 2207  . latanoprost (XALATAN) 0.005 % ophthalmic solution 1 drop  1 drop Both Eyes QHS Fuller Plan A, MD   1 drop at 03/12/20 2208  . midodrine (PROAMATINE) tablet 10 mg  10 mg Oral TID Fuller Plan A, MD   10 mg at 03/13/20 1002  . multivitamin (RENA-VIT) tablet 1 tablet  1 tablet Oral QHS Roney Jaffe, MD   1 tablet at 03/12/20 2208  . nystatin (MYCOSTATIN/NYSTOP) topical powder 1 application  1 g Topical BID PRN Smith, Rondell A, MD      . phosphorus (K PHOS NEUTRAL) tablet 250 mg  250 mg Oral Once Hammons, Theone Murdoch, RPH      . rosuvastatin (CRESTOR) tablet 20 mg  20 mg Oral QHS Smith, Rondell A, MD   20 mg at 03/12/20 2208  . sodium chloride flush (NS) 0.9 % injection 10-40 mL  10-40 mL Intracatheter PRN Ghimire, Henreitta Leber, MD         Discharge Medications: Please see discharge summary for a list of discharge medications.  Relevant  Imaging Results:  Relevant Lab Results:   Additional Information SSN: [id number]         COVID negative on 03/10/20;    Requires Dialysis transportation MWF at [facility], [place]

## 2020-03-13 NOTE — Progress Notes (Addendum)
PROGRESS NOTE        PATIENT DETAILS Name: Gabriel Allemand Madlock Sr. Age: 86 y.o. Sex: male Date of Birth: Apr 12, 1929 Admit Date: 03/10/2020 Admitting Physician Evalee Mutton Kristeen Mans, MD [id number], Hunt Oris, MD  Brief Narrative: Patient is a 86 y.o. male with history of ESRD on HD MWF, HLD, anemia of chronic disease, orthostatic hypotension, depression/dementia who was found to be confused (more than usual) at HD and sent to the hospital for further evaluation.  See below for further details.  Significant events: 5/5>> readmit to [facility] for evaluation of encephalopathy/hypoglycemia. 5/1-5/4>> admit to [facility]-frequent falls-found to have SDH, left hip hematoma, acute blood loss anemia requiring 2 units of PRBC  Antimicrobial therapy: Rocephin: 5/5>>5/8  Microbiology data: None  Procedures : None  Significant studies: 5/8>> CT chest: Slight enlargement of bilateral pleural effusions, compressive atelectasis bilateral lower lobes 5/8>> CT head: Minimal decrease in SDH  Consults: Nephrology PCCM  DVT Prophylaxis : SCD's  Subjective: Much more awake and alert compared to yesterday-answer most of my questions appropriately.  Assessment/Plan: Hypoglycemia: Continues to recur-etiology uncertain-I highly suspect that this is from frailty/poor oral intake/underlying ESRD.  But given recurrent hypoglycemia (was also occurring last admission)-have ordered a C-peptide/proinsulin/sulfonylureas level (pending).  Given failure to thrive syndrome/poor appetite-we will perform a ACTH stimulation test tomorrow morning.  TSH within normal limits.  Continue close monitoring-and gentle hydration with D5W.  Continue to encourage oral intake-have asked nursing staff to make sure patient is fed with assistance.   Recent Labs    03/13/20 0739 03/13/20 1133 03/13/20 1148  GLUCAP 74 14* 94    Acute metabolic encephalopathy: Does have underlying dementia-but mental status has improved  compared to on admission.  Suspect etiology of encephalopathy to be a combination of hypoglycemia, recent SDH/ongoing UTI/infection.  CT head on admission showed decreased size of the SDH.  Continue supportive care-if mentation worsens or does not improve further-we can contemplate further work-up.    Complicated UTI/Asp PNA with persistent bilateral pleural effusion (right>>left): Thought to have UTI/asp PNA last admission-and discharged on Augmentin-has been on Rocephin since admission-CT chest done yesterday shows mostly atelectasis-but still shows a pleural effusion.  He has not missed HD-volume status is stable-not sure if this is a parapneumonic effusion/exudative effusion-or a transudate related to hemodialysis etc.  Given his ongoing failure to thrive syndrome/hypoglycemia-and new pleural effusion (not present on imaging studies in 2020)-have asked PCCM to see if we could do a diagnostic thoracocentesis-to further evaluate.  Suspect can discontinue Rocephin today.  ESRD on HD MWF: Nephrology following and directing care  Recent SDH: Following a mechanical fall (last admission)-repeat CT head this admission shows decreased size of SDH.  Recent left hip hematoma: Following a mechanical fall (last admission)-supportive care.  Hemoglobin currently stable-Did require PRBC transfusion last admission.  Anemia: Related to ESRD-hemoglobin stable with no need for transfusion.  Orthostatic hypotension: Continue midodrine-follow.  Depression: Continue Celexa  History of dementia: Stable-encephalopathy improving-suspect mentation not far from usual baseline.  Gout: No flare-continue allopurinol  Diet: Diet Order            DIET - DYS 1 Room service appropriate? No; Fluid consistency: Thin  Diet effective now               Code Status: Full code  Family Communication: Left voicemail for patient's son ([phone]) on 5/8 x  2 (second voicemail left this afternoon after RN notified me that  the patient's son wanted to talk to me)  Disposition Plan: Status is: Inpatient  Remains inpatient appropriate because:Altered mental status  Dispo: The patient is from: Home              Anticipated d/c is to: Home              Anticipated d/c date is: 2 days              Patient currently is not medically stable to d/c.   Barriers to Discharge: Hypoglycemia/ongoing encephalopathy/recurrent pleural effusion   Antimicrobial agents: Anti-infectives (From admission, onward)   Start     Dose/Rate Route Frequency Ordered Stop   03/10/20 2200  amoxicillin-clavulanate (AUGMENTIN) 500-125 MG per tablet 500 mg  Status:  Discontinued     500 mg Oral Daily at bedtime 03/10/20 1500 03/10/20 1752   03/10/20 1800  cefTRIAXone (ROCEPHIN) 2 g in sodium chloride 0.9 % 100 mL IVPB     2 g 200 mL/hr over 30 Minutes Intravenous Every 24 hours 03/10/20 1752         Time spent: 25 minutes-Greater than 50% of this time was spent in counseling, explanation of diagnosis, planning of further management, and coordination of care.  MEDICATIONS: Scheduled Meds: . allopurinol  100 mg Oral Daily  . calcitRIOL  1.5 mcg Oral Q M,W,F-HD  . Chlorhexidine Gluconate Cloth  6 each Topical Q0600  . cinacalcet  30 mg Oral Q M,W,F-HD  . citalopram  10 mg Oral Daily  . [START ON 03/14/2020] cosyntropin  0.25 mg Intravenous Once  . darbepoetin (ARANESP) injection - DIALYSIS  60 mcg Intravenous Q Thu-HD  . feeding supplement (NEPRO CARB STEADY)  237 mL Oral TID BM  . feeding supplement (PRO-STAT SUGAR FREE 64)  30 mL Oral BID  . BioGaia Probiotic  5 drop Oral Q2000  . latanoprost  1 drop Both Eyes QHS  . midodrine  10 mg Oral TID  . multivitamin  1 tablet Oral QHS  . rosuvastatin  20 mg Oral QHS   Continuous Infusions: . cefTRIAXone (ROCEPHIN)  IV Stopped (03/12/20 1819)  . dextrose 50 mL/hr at 03/13/20 1346   PRN Meds:.acetaminophen **OR** acetaminophen, albuterol, nystatin, sodium chloride  flush   PHYSICAL EXAM: Vital signs: Vitals:   03/12/20 1828 03/12/20 2010 03/13/20 0331 03/13/20 0927  BP:  100/66 (!) 147/68 (!) 125/53  Pulse:  68 60 82  Resp:  18 18 20   Temp: 97.8 F (36.6 C) 97.9 F (36.6 C) 98.5 F (36.9 C) 98 F (36.7 C)  TempSrc: Oral Oral Oral Oral  SpO2:  97% 97% 100%  Weight:      Height:       Filed Weights   03/11/20 1145 03/12/20 0724 03/12/20 1110  Weight: 70.3 kg 64.9 kg 62.7 kg   Body mass index is 20.41 kg/m.   Gen Exam: Pleasantly confused but not in any distress. HEENT:atraumatic, normocephalic Chest: B/L clear to auscultation anteriorly CVS:S1S2 regular Abdomen:soft non tender, non distended Extremities:no edema Neurology: Non focal Skin: no rash  I have personally reviewed following labs and imaging studies  LABORATORY DATA: CBC: Recent Labs  Lab 03/08/20 0303 03/09/20 0403 03/10/20 1204 03/11/20 0500 03/12/20 0815  WBC 13.4* 12.6* 11.3* 10.9* 9.7  HGB 7.3* 8.9* 9.9* 9.4* 9.6*  HCT 22.3* 26.4* 30.7* 29.3* 30.3*  MCV 94.9 94.0 96.2 96.1 98.1  PLT 219 225 281 265 280  Basic Metabolic Panel: Recent Labs  Lab 03/07/20 0358 03/07/20 0358 03/08/20 0303 03/08/20 1303 03/09/20 0403 03/10/20 1204 03/11/20 0500 03/12/20 0814 03/13/20 0442  NA 138   < > 136  --  134* 135 131* 133* 137  K 3.7   < > 3.9  --  3.9 4.4 4.2 3.3* 3.8  CL 95*   < > 93*  --  92* 93* 88* 97* 96*  CO2 29   < > 31  --  28 28 28 28 28   GLUCOSE 101*   < > 96   < > 101* 95 90 99 100*  BUN 23   < > 33*  --  17 31* 34* 11 16  CREATININE 5.77*   < > 7.37*  --  4.57* 6.63* 7.38* 4.34* 3.17*  CALCIUM 9.3   < > 9.4  --  9.1 9.9 9.5 8.7* 9.5  MG 2.1  --  2.1  --  2.0  --   --   --   --   PHOS  --   --  3.2  --   --  2.8 2.9 1.9* 2.2*   < > = values in this interval not displayed.    GFR: Estimated Creatinine Clearance: 14.3 mL/min (A) (by C-G formula based on SCr of 3.17 mg/dL (H)).  Liver Function Tests: Recent Labs  Lab 03/06/20 1437  03/06/20 1437 03/07/20 0358 03/07/20 0358 03/08/20 0303 03/09/20 0403 03/10/20 1204 03/11/20 0500 03/12/20 0814  AST 27  --  27  --  25 26  --   --   --   ALT 13  --  13  --  12 13  --   --   --   ALKPHOS 58  --  57  --  56 54  --   --   --   BILITOT 1.2  --  1.5*  --  1.4* 1.4*  --   --   --   PROT 6.3*  --  6.0*  --  5.8* 5.8*  --   --   --   ALBUMIN 3.4*   < > 3.3*   < > 3.2* 3.0* 3.4* 3.1* 2.9*   < > = values in this interval not displayed.   No results for input(s): LIPASE, AMYLASE in the last 168 hours. Recent Labs  Lab 03/06/20 1437  AMMONIA 18    Coagulation Profile: No results for input(s): INR, PROTIME in the last 168 hours.  Cardiac Enzymes: Recent Labs  Lab 03/06/20 1745  CKTOTAL 181    BNP (last 3 results) No results for input(s): PROBNP in the last 8760 hours.  Lipid Profile: No results for input(s): CHOL, HDL, LDLCALC, TRIG, CHOLHDL, LDLDIRECT in the last 72 hours.  Thyroid Function Tests: No results for input(s): TSH, T4TOTAL, FREET4, T3FREE, THYROIDAB in the last 72 hours.  Anemia Panel: No results for input(s): VITAMINB12, FOLATE, FERRITIN, TIBC, IRON, RETICCTPCT in the last 72 hours.  Urine analysis:    Component Value Date/Time   COLORURINE YELLOW 03/07/2020 1128   APPEARANCEUR HAZY (A) 03/07/2020 1128   LABSPEC 1.008 03/07/2020 1128   PHURINE 9.0 (H) 03/07/2020 1128   GLUCOSEU NEGATIVE 03/07/2020 1128   GLUCOSEU NEGATIVE 01/30/2020 1518   HGBUR SMALL (A) 03/07/2020 1128   BILIRUBINUR NEGATIVE 03/07/2020 1128   KETONESUR NEGATIVE 03/07/2020 1128   PROTEINUR 100 (A) 03/07/2020 1128   UROBILINOGEN 0.2 01/30/2020 1518   NITRITE NEGATIVE 03/07/2020 1128   LEUKOCYTESUR LARGE (A) 03/07/2020 1128  Sepsis Labs: Lactic Acid, Venous    Component Value Date/Time   LATICACIDVEN 0.66 12/08/2016 0605    MICROBIOLOGY: Recent Results (from the past 240 hour(s))  Urine culture     Status: None   Collection Time: 03/06/20  1:11 PM    Specimen: Urine, Random  Result Value Ref Range Status   Specimen Description URINE, RANDOM  Final   Special Requests NONE  Final   Culture   Final    NO GROWTH Performed at [facility], [address] [place], [place] [address]    Report Status 03/08/2020 FINAL  Final  Blood culture (routine x 2)     Status: None   Collection Time: 03/06/20  2:37 PM   Specimen: BLOOD RIGHT FOREARM  Result Value Ref Range Status   Specimen Description BLOOD RIGHT FOREARM  Final   Special Requests   Final    BOTTLES DRAWN AEROBIC AND ANAEROBIC Blood Culture results may not be optimal due to an inadequate volume of blood received in culture bottles   Culture   Final    NO GROWTH 5 DAYS Performed at [facility], [place] [address] [place], [place] [address]    Report Status 03/11/2020 FINAL  Final  Blood culture (routine x 2)     Status: None   Collection Time: 03/06/20  3:15 PM   Specimen: BLOOD  Result Value Ref Range Status   Specimen Description BLOOD RIGHT ANTECUBITAL  Final   Special Requests   Final    BOTTLES DRAWN AEROBIC AND ANAEROBIC Blood Culture results may not be optimal due to an inadequate volume of blood received in culture bottles   Culture   Final    NO GROWTH 5 DAYS Performed at [facility], [place] [address], [place], [place] [address]    Report Status 03/11/2020 FINAL  Final  Respiratory Panel by RT PCR (Flu A&B, Covid) - Nasopharyngeal Swab     Status: None   Collection Time: 03/06/20 11:15 PM   Specimen: Nasopharyngeal Swab  Result Value Ref Range Status   SARS Coronavirus 2 by RT PCR NEGATIVE NEGATIVE Final    Comment: (NOTE) SARS-CoV-2 target nucleic acids are NOT DETECTED. The SARS-CoV-2 RNA is generally detectable in upper respiratoy specimens during the acute phase of infection. The lowest concentration of SARS-CoV-2 viral copies this assay can detect is 131 copies/mL. A negative result does not preclude SARS-Cov-2 infection and should not be used  as the sole basis for treatment or other patient management decisions. A negative result may occur with  improper specimen collection/handling, submission of specimen other than nasopharyngeal swab, presence of viral mutation(s) within the areas targeted by this assay, and inadequate number of viral copies (<131 copies/mL). A negative result must be combined with clinical observations, patient history, and epidemiological information. The expected result is Negative. Fact Sheet for Patients:  PinkCheek.be Fact Sheet for Healthcare Providers:  GravelBags.it This test is not yet ap proved or cleared by the [place] FDA and  has been authorized for detection and/or diagnosis of SARS-CoV-2 by FDA under an Emergency Use Authorization (EUA). This EUA will remain  in effect (meaning this test can be used) for the duration of the COVID-19 declaration under Section 564(b)(1) of the Act, 21 U.S.C. section 360bbb-3(b)(1), unless the authorization is terminated or revoked sooner.    Influenza A by PCR NEGATIVE NEGATIVE Final   Influenza B by PCR NEGATIVE NEGATIVE Final    Comment: (NOTE) The Xpert  Xpress SARS-CoV-2/FLU/RSV assay is intended as an aid in  the diagnosis of influenza from Nasopharyngeal swab specimens and  should not be used as a sole basis for treatment. Nasal washings and  aspirates are unacceptable for Xpert Xpress SARS-CoV-2/FLU/RSV  testing. Fact Sheet for Patients: PinkCheek.be Fact Sheet for Healthcare Providers: GravelBags.it This test is not yet approved or cleared by the [place] FDA and  has been authorized for detection and/or diagnosis of SARS-CoV-2 by  FDA under an Emergency Use Authorization (EUA). This EUA will remain  in effect (meaning this test can be used) for the duration of the  Covid-19 declaration under Section 564(b)(1) of the Act,  21  U.S.C. section 360bbb-3(b)(1), unless the authorization is  terminated or revoked. Performed at [facility], [place] [address], [place], [place] [address]   SARS CORONAVIRUS 2 (TAT 6-24 HRS) Nasopharyngeal Nasopharyngeal Swab     Status: None   Collection Time: 03/10/20  1:20 PM   Specimen: Nasopharyngeal Swab  Result Value Ref Range Status   SARS Coronavirus 2 NEGATIVE NEGATIVE Final    Comment: (NOTE) SARS-CoV-2 target nucleic acids are NOT DETECTED. The SARS-CoV-2 RNA is generally detectable in upper and lower respiratory specimens during the acute phase of infection. Negative results do not preclude SARS-CoV-2 infection, do not rule out co-infections with other pathogens, and should not be used as the sole basis for treatment or other patient management decisions. Negative results must be combined with clinical observations, patient history, and epidemiological information. The expected result is Negative. Fact Sheet for Patients: SugarRoll.be Fact Sheet for Healthcare Providers: https://www.woods-mathews.com/ This test is not yet approved or cleared by the [place] FDA and  has been authorized for detection and/or diagnosis of SARS-CoV-2 by FDA under an Emergency Use Authorization (EUA). This EUA will remain  in effect (meaning this test can be used) for the duration of the COVID-19 declaration under Section 56 4(b)(1) of the Act, 21 U.S.C. section 360bbb-3(b)(1), unless the authorization is terminated or revoked sooner. Performed at [facility], [place] [address] [place], [place] [address]   MRSA PCR Screening     Status: None   Collection Time: 03/11/20  3:06 AM   Specimen: Nasal Mucosa; Nasopharyngeal  Result Value Ref Range Status   MRSA by PCR NEGATIVE NEGATIVE Final    Comment:        The GeneXpert MRSA Assay (FDA approved for NASAL specimens only), is one component of a comprehensive MRSA  colonization surveillance program. It is not intended to diagnose MRSA infection nor to guide or monitor treatment for MRSA infections. Performed at [facility], [place] [address], [place], [place] [address]     RADIOLOGY STUDIES/RESULTS: CT CHEST WO CONTRAST  Result Date: 03/13/2020 CLINICAL DATA:  Bilateral pleural effusions, abnormal chest x-ray EXAM: CT CHEST WITHOUT CONTRAST TECHNIQUE: Multidetector CT imaging of the chest was performed following the standard protocol without IV contrast. COMPARISON:  03/06/2020 FINDINGS: Cardiovascular: Heart is stable without pericardial effusion. Extensive calcification of the mitral and aortic valves. Stable atherosclerosis of the thoracic aorta and coronary vessels. Mediastinum/Nodes: No enlarged mediastinal or axillary lymph nodes. Thyroid gland, trachea, and esophagus demonstrate no significant findings. Lungs/Pleura: There are small bilateral pleural effusions, right greater than left. Overall, slight increased since prior study, estimated less than 500 cc on the left and less than 1 L on the right. Dependent atelectasis within the bilateral lower lobes. No airspace disease or pneumothorax. The central airways are patent. Upper  Abdomen: Stable appearance of the upper abdomen. Musculoskeletal: No acute or destructive bony lesions. Reconstructed images demonstrate no additional findings. There is a right internal jugular catheter tip within the superior vena cava. IMPRESSION: 1. Slight enlargement of bilateral pleural effusions, right greater than left. 2. Compressive atelectasis bilateral lower lobes. 3.  Aortic Atherosclerosis (ICD10-I70.0). Electronically Signed   By: Randa Ngo M.D.   On: 03/13/2020 00:32     LOS: 2 days   Oren Binet, MD  [facility]    To contact the attending provider between 7A-7P or the covering provider during after hours 7P-7A, please log into the web site [email or web address] and access using universal [facility] password for that web site. If you do not have the password, please call the hospital operator.  03/13/2020, 2:02 PM

## 2020-03-13 NOTE — Progress Notes (Signed)
MEDICATION RELATED CONSULT NOTE - Follow-Up  Pharmacy Consult for phosphorus replacement Indication: hypophosphatemia  No Known Allergies  Patient Measurements: Height: 5\' 9"  (175.3 cm) Weight: 62.7 kg (138 lb 3.7 oz) IBW/kg (Calculated) : 70.7 Adjusted Body Weight:   Vital Signs: Temp: 98 F (36.7 C) (05/08 0927) Temp Source: Oral (05/08 0927) BP: 125/53 (05/08 0927) Pulse Rate: 82 (05/08 0927) Intake/Output from previous day: 05/07 0701 - 05/08 0700 In: 1601.8 [P.O.:600; I.V.:1001.8] Out: 2000  Intake/Output from this shift: Total I/O In: 360 [P.O.:360] Out: -   Labs: Recent Labs    03/10/20 1204 03/10/20 1204 03/11/20 0500 03/12/20 0814 03/12/20 0815 03/13/20 0442  WBC 11.3*  --  10.9*  --  9.7  --   HGB 9.9*  --  9.4*  --  9.6*  --   HCT 30.7*  --  29.3*  --  30.3*  --   PLT 281  --  265  --  280  --   CREATININE 6.63*   < > 7.38* 4.34*  --  3.17*  PHOS 2.8   < > 2.9 1.9*  --  2.2*  ALBUMIN 3.4*  --  3.1* 2.9*  --   --    < > = values in this interval not displayed.   Estimated Creatinine Clearance: 14.3 mL/min (A) (by C-G formula based on SCr of 3.17 mg/dL (H)).   Microb  Medical History: Past Medical History:  Diagnosis Date  . ANEMIA-IRON DEFICIENCY 03/09/2008  . Arthritis    cervical spine.   . Complex renal cyst 06/19/2011  . Depression 03/26/2014  . DIVERTICULOSIS, COLON 03/09/2008  . ESRD (end stage renal disease) ([place]) 03/09/2008  . Flu 12/08/2016  . GOUT 03/09/2008  . Hemorrhoids 08/2009   internal.   . History of blood transfusion   . HYPERLIPIDEMIA 03/09/2008  . Hyperparathyroidism ([place])   . HYPERTENSION 03/09/2008  . Prostate cancer [facility]) 2002   Completed external beam radiation 2003.per HPI  . PROSTATE CANCER, HX OF 03/09/2008  . Radiation cystitis 2010.  Marland Kitchen Stroke [facility])     Medications:  Medications Prior to Admission  Medication Sig Dispense Refill Last Dose  . allopurinol (ZYLOPRIM) 100 MG tablet TAKE 1 TABLET BY MOUTH EVERY DAY  (Patient taking differently: Take 100 mg by mouth daily. ) 90 tablet 3   . amoxicillin-clavulanate (AUGMENTIN) 500-125 MG tablet Take 1 tablet (500 mg total) by mouth at bedtime for 5 days. 5 tablet 0   . aspirin EC 81 MG tablet Take 1 tablet (81 mg total) by mouth daily.     Lorin Picket 1 GM 210 MG(Fe) tablet Take 420 mg by mouth 3 (three) times daily with meals.   10   . calcitRIOL (ROCALTROL) 0.5 MCG capsule Take 3 capsules (1.5 mcg total) by mouth every Monday, Wednesday, and Friday with hemodialysis. 36 capsule 0   . cinacalcet (SENSIPAR) 30 MG tablet Take 1 tablet (30 mg total) by mouth every Monday, Wednesday, and Friday with hemodialysis. 12 tablet 0   . citalopram (CELEXA) 10 MG tablet Take 1 tablet (10 mg total) by mouth daily. 90 tablet 1   . [START ON 03/15/2020] Darbepoetin Alfa (ARANESP) 60 MCG/0.3ML SOSY injection Inject 0.3 mLs (60 mcg total) into the vein every Monday with hemodialysis. 4.2 mL    . latanoprost (XALATAN) 0.005 % ophthalmic solution Place 1 drop into both eyes at bedtime.   5   . lidocaine-prilocaine (EMLA) cream Apply 1 application topically See admin instructions. Apply to  access site 1 hour before dialysis. Cover with saran wrap  11   . midodrine (PROAMATINE) 10 MG tablet Take 1 tablet (10 mg total) by mouth 3 (three) times daily. Take in the morning, lunch time and 4pm. Do not take after 4pm. 270 tablet 3   . Multiple Vitamin (MULTIVITAMIN WITH MINERALS) TABS tablet Take 1 tablet by mouth daily.     Marland Kitchen nystatin (MYCOSTATIN/NYSTOP) powder Use as directed twice per day (Patient taking differently: Apply 1 g topically 2 (two) times daily as needed (rash). ) 45 g 1   . rosuvastatin (CRESTOR) 20 MG tablet Take 1 tablet (20 mg total) by mouth at bedtime. 90 tablet 3   . Vitamin D, Ergocalciferol, (DRISDOL) 1.25 MG (50000 UNIT) CAPS capsule Take 1 capsule (50,000 Units total) by mouth every 7 (seven) days. 12 capsule 0    Scheduled:  . allopurinol  100 mg Oral Daily  .  calcitRIOL  1.5 mcg Oral Q M,W,F-HD  . Chlorhexidine Gluconate Cloth  6 each Topical Q0600  . cinacalcet  30 mg Oral Q M,W,F-HD  . citalopram  10 mg Oral Daily  . [START ON 03/14/2020] cosyntropin  0.25 mg Intravenous Once  . darbepoetin (ARANESP) injection - DIALYSIS  60 mcg Intravenous Q Thu-HD  . feeding supplement (NEPRO CARB STEADY)  237 mL Oral TID BM  . feeding supplement (PRO-STAT SUGAR FREE 64)  30 mL Oral BID  . BioGaia Probiotic  5 drop Oral Q2000  . latanoprost  1 drop Both Eyes QHS  . midodrine  10 mg Oral TID  . multivitamin  1 tablet Oral QHS  . rosuvastatin  20 mg Oral QHS    Assessment: Pt is an ESRD HD dependent patient that had a low phos level 1.9 on 5/7.  His phos binder was discontinued and 10 mmol replacement ordered. Phos level up to 2.2 (goal 2.5-4.5).    K+ 3.8 PO4 2.2 cCA 10.3  As patient is near goal will give small oral replacement dose this AM.  Plan:  KPhos Neutral 250mg  PO x1 (provides 47mmol phos, 54meq K) Rx will sign off.  [employer], Pharm.D., BCPS Clinical Pharmacist Clinical phone for 03/13/2020 from 8:30-4:00 is [phone].  **Pharmacist phone directory can be found on [email or web address] listed under [place].  03/13/2020 10:20 AM

## 2020-03-13 NOTE — Evaluation (Signed)
Occupational Therapy Evaluation Patient Details Name: Gabriel Beaumier Rinella Sr. MRN: [id number] DOB: 01-02-1929 Today's Date: 03/13/2020    History of Present Illness Pt is an 86 y/o male with PMH of ESRD on HD MWF, HLD, anemia of chronic disease, orthostatic hypotension, depression who was found to be confused at HD and sent to the hospital for further evaluation. Pt found to be hypoglycemic and with acute metabolic encephalopathy. Of note, pt with recent admission (5/1-5/4) secondary to falls with a SDH.   Clinical Impression   This 86 y/o male presents with the above. Pt with recent admit and d/c home. Pt now presenting with weakness, impaired cognition and decrease in mobility status impacting his functional performance. Pt inconsistently following commands today, often with mumbled speech. He required up to maxA for bed mobility, and only tolerating sitting EOB for short period of time (<5 min) prior to initiating return to supine. Pt currently totalA for ADL tasks (partly due to cognition). He will benefit from continued acute OT services and recommend follow up therapy services in SNF setting after discharge to maximize his overall safety and independence with ADL and mobility. Will follow.     Follow Up Recommendations  SNF;Supervision/Assistance - 24 hour    Equipment Recommendations  Other (comment)(TBD in next venue)           Precautions / Restrictions Precautions Precautions: Fall Restrictions Weight Bearing Restrictions: No      Mobility Bed Mobility Overal bed mobility: Needs Assistance Bed Mobility: Supine to Sit;Sit to Supine;Rolling Rolling: Mod assist;+2 for safety/equipment   Supine to sit: HOB elevated;Max assist Sit to supine: Min guard   General bed mobility comments: rolling to L/R for pericare after incontinent BM. pt requiring maxA for transition to sitting, intiating and returning himself to supine without assist   Transfers                 General  transfer comment: unabl, pt returning himself to supine (x2) with attempts to sit EOB    Balance Overall balance assessment: Needs assistance;History of Falls Sitting-balance support: Feet supported;Bilateral upper extremity supported;Single extremity supported Sitting balance-Leahy Scale: Poor                                     ADL either performed or assessed with clinical judgement   ADL Overall ADL's : Needs assistance/impaired                                       General ADL Comments: pt currently totalA, partly due to impaired cognition and difficulty with command following today                  Pertinent Vitals/Pain Pain Assessment: Faces Faces Pain Scale: No hurt Pain Intervention(s): Monitored during session     Hand Dominance Right   Extremity/Trunk Assessment Upper Extremity Assessment Upper Extremity Assessment: Generalized weakness;Difficult to assess due to impaired cognition   Lower Extremity Assessment Lower Extremity Assessment: Defer to PT evaluation       Communication Communication Communication: HOH   Cognition Arousal/Alertness: Lethargic Behavior During Therapy: Restless Overall Cognitive Status: No family/caregiver present to determine baseline cognitive functioning Area of Impairment: Orientation;Following commands;Problem solving                 Orientation Level: Disoriented to;Place;Time;Situation  Following Commands: Follows one step commands inconsistently     Problem Solving: Slow processing;Difficulty sequencing;Requires verbal cues;Requires tactile cues General Comments: hx of dementia per chart, mumbled/tangential speech which is difficult to understand. Pt did state "sometimes I get dizzy at home" end of session   General Comments       Exercises     Shoulder Instructions      Home Living Family/patient expects to be discharged to:: Unsure                                  Additional Comments: no family present; pt with confusion and unable to answer any questions      Prior Functioning/Environment          Comments: Unsure - no family present; pt with confusion and unable to answer any questions        OT Problem List: Decreased strength;Decreased range of motion;Decreased activity tolerance;Impaired balance (sitting and/or standing);Decreased cognition;Decreased safety awareness;Decreased knowledge of use of DME or AE      OT Treatment/Interventions: Self-care/ADL training;Therapeutic exercise;Energy conservation;DME and/or AE instruction;Therapeutic activities;Cognitive remediation/compensation;Patient/family education;Balance training    OT Goals(Current goals can be found in the care plan section) Acute Rehab OT Goals Patient Stated Goal: to sleep OT Goal Formulation: With patient Time For Goal Achievement: 03/27/20 Potential to Achieve Goals: Fair  OT Frequency: Min 2X/week   Barriers to D/C:            Co-evaluation              AM-PAC OT "6 Clicks" Daily Activity     Outcome Measure Help from another person eating meals?: A Lot Help from another person taking care of personal grooming?: A Lot Help from another person toileting, which includes using toliet, bedpan, or urinal?: Total Help from another person bathing (including washing, rinsing, drying)?: A Lot Help from another person to put on and taking off regular upper body clothing?: A Lot Help from another person to put on and taking off regular lower body clothing?: Total 6 Click Score: 10   End of Session Equipment Utilized During Treatment: Oxygen Nurse Communication: Mobility status  Activity Tolerance: Patient tolerated treatment well;Patient limited by fatigue Patient left: in bed;with call bell/phone within reach;with bed alarm set  OT Visit Diagnosis: Muscle weakness (generalized) (M62.81);Other symptoms and signs involving cognitive function                 Time: 1212-1235 OT Time Calculation (min): 23 min Charges:  OT General Charges $OT Visit: 1 Visit OT Evaluation $OT Eval Moderate Complexity: 1 Mod OT Treatments $Self Care/Home Management : 8-22 mins  Lou Cal, OT Acute Rehabilitation Services Pager [phone] Office [phone]  Raymondo Band 03/13/2020, 5:12 PM

## 2020-03-14 LAB — GLUCOSE, CAPILLARY
Glucose-Capillary: 79 mg/dL (ref 70–99)
Glucose-Capillary: 25 mg/dL — CL (ref 70–99)
Glucose-Capillary: 93 mg/dL (ref 70–99)
Glucose-Capillary: 50 mg/dL — ABNORMAL LOW (ref 70–99)
Glucose-Capillary: 37 mg/dL — CL (ref 70–99)
Glucose-Capillary: 54 mg/dL — ABNORMAL LOW (ref 70–99)
Glucose-Capillary: 103 mg/dL — ABNORMAL HIGH (ref 70–99)
Glucose-Capillary: 92 mg/dL (ref 70–99)
Glucose-Capillary: 115 mg/dL — ABNORMAL HIGH (ref 70–99)

## 2020-03-14 LAB — PROTEIN, TOTAL: Total Protein: 7.2 g/dL (ref 6.5–8.1)

## 2020-03-14 LAB — LACTATE DEHYDROGENASE: LDH: 424 U/L — ABNORMAL HIGH (ref 98–192)

## 2020-03-14 LAB — CBC
HCT: 37.7 % — ABNORMAL LOW (ref 39.0–52.0)
Hemoglobin: 11.6 g/dL — ABNORMAL LOW (ref 13.0–17.0)
MCH: 31.2 pg (ref 26.0–34.0)
MCHC: 30.8 g/dL (ref 30.0–36.0)
MCV: 101.3 fL — ABNORMAL HIGH (ref 80.0–100.0)
Platelets: 280 10*3/uL (ref 150–400)
RBC: 3.72 MIL/uL — ABNORMAL LOW (ref 4.22–5.81)
RDW: 19 % — ABNORMAL HIGH (ref 11.5–15.5)
WBC: 16 10*3/uL — ABNORMAL HIGH (ref 4.0–10.5)
nRBC: 1.1 % — ABNORMAL HIGH (ref 0.0–0.2)

## 2020-03-14 LAB — ACTH STIMULATION, 3 TIME POINTS
Cortisol, 30 Min: 21.7 ug/dL
Cortisol, 60 Min: 20.1 ug/dL
Cortisol, Base: 23.1 ug/dL

## 2020-03-14 LAB — RENAL FUNCTION PANEL
Albumin: 3.5 g/dL (ref 3.5–5.0)
Anion gap: 16 — ABNORMAL HIGH (ref 5–15)
BUN: 34 mg/dL — ABNORMAL HIGH (ref 8–23)
CO2: 25 mmol/L (ref 22–32)
Calcium: 10.5 mg/dL — ABNORMAL HIGH (ref 8.9–10.3)
Chloride: 95 mmol/L — ABNORMAL LOW (ref 98–111)
Creatinine, Ser: 4.92 mg/dL — ABNORMAL HIGH (ref 0.61–1.24)
GFR calc Af Amer: 11 mL/min — ABNORMAL LOW (ref >60)
GFR calc non Af Amer: 10 mL/min — ABNORMAL LOW (ref >60)
Glucose, Bld: 88 mg/dL (ref 70–99)
Phosphorus: 2.5 mg/dL (ref 2.5–4.6)
Potassium: 4.1 mmol/L (ref 3.5–5.1)
Sodium: 136 mmol/L (ref 135–145)

## 2020-03-14 LAB — INSULIN, RANDOM: Insulin: 21.8 u[IU]/mL (ref 2.6–24.9)

## 2020-03-14 LAB — MAGNESIUM: Magnesium: 2.4 mg/dL (ref 1.7–2.4)

## 2020-03-14 LAB — PROTEIN, PLEURAL OR PERITONEAL FLUID: Total protein, fluid: <3 g/dL

## 2020-03-14 LAB — LACTATE DEHYDROGENASE, PLEURAL OR PERITONEAL FLUID: LD, Fluid: 136 U/L — ABNORMAL HIGH (ref 3–23)

## 2020-03-14 LAB — TSH: TSH: 1.415 u[IU]/mL (ref 0.350–4.500)

## 2020-03-14 LAB — C-PEPTIDE: C-Peptide: 16.1 ng/mL — ABNORMAL HIGH (ref 1.1–4.4)

## 2020-03-14 LAB — PROCALCITONIN: Procalcitonin: 1.89 ng/mL

## 2020-03-14 MED ORDER — PREDNISONE 50 MG PO TABS
50.0000 mg | ORAL_TABLET | Freq: Every day | ORAL | Status: DC
  Administered 2020-03-14 – 2020-03-16 (×3): 50 mg via ORAL
  Filled 2020-03-14 (×3): qty 1

## 2020-03-14 MED ORDER — DEXTROSE 50 % IV SOLN
INTRAVENOUS | Status: AC
  Administered 2020-03-14: 50 mL
  Filled 2020-03-14: qty 50

## 2020-03-14 MED ORDER — GLUCOSE 40 % PO GEL
ORAL | Status: AC
  Filled 2020-03-14: qty 1

## 2020-03-14 MED ORDER — GLUCOSE 40 % PO GEL
ORAL | Status: AC
  Administered 2020-03-14: 75 g via ORAL
  Filled 2020-03-14: qty 1

## 2020-03-14 MED ORDER — GLUCOSE 40 % PO GEL: 2.0000 | ORAL | Status: AC

## 2020-03-14 MED ORDER — CHLORHEXIDINE GLUCONATE CLOTH 2 % EX PADS
6.0000 | MEDICATED_PAD | Freq: Every day | CUTANEOUS | Status: DC
  Administered 2020-03-14 – 2020-03-16 (×3): 6 via TOPICAL

## 2020-03-14 MED ORDER — ASPIRIN EC 81 MG PO TBEC
81.0000 mg | DELAYED_RELEASE_TABLET | Freq: Every day | ORAL | Status: DC
  Administered 2020-03-14 – 2020-03-22 (×9): 81 mg via ORAL
  Filled 2020-03-14 (×9): qty 1

## 2020-03-14 NOTE — Progress Notes (Signed)
Hypoglycemic Event   CBG: 25 Treatment: Glucose gel x 2 Symptoms:  None Follow-up CBG:  93 Possible Reasons for Event: Unknown

## 2020-03-14 NOTE — Progress Notes (Signed)
Brief Nutrition Note  RD consulted for calorie count. Orders placed, RD will follow-up with results on 5/10 and 5/11.   Calorie Count Instructions:  Please hang calorie count envelope on the patient's door. Document percent consumed for each item on the patient's meal tray ticket and keep in envelope. Also document percent of any supplement or snack pt consumes and keep documentation in envelope for RD to review.   Larkin Ina, MS, RD, LDN RD pager number and weekend/on-call pager number located in [place].

## 2020-03-14 NOTE — Progress Notes (Signed)
[facility] Progress Note   Subjective:   Patient seen and examined at bedside in room.  No specific complaints.  Denies SOB, CP, and n/v/d.  Dry cough during exam.  Thoracentesis completed yesterday with 642mL fluid removed.  Objective Vitals:   03/13/20 1700 03/13/20 2048 03/14/20 0616 03/14/20 1110  BP: 128/77 (!) 116/53 (!) 148/65 (!) 147/60  Pulse: 79 92 89 68  Resp: 18 18 16 18   Temp: 98.3 F (36.8 C) 98.2 F (36.8 C) 98.3 F (36.8 C) 98.2 F (36.8 C)  TempSrc: Oral Oral Oral Oral  SpO2: 95% 97% 97% (!) 76%  Weight:  66.2 kg    Height:       Physical Exam General:NAD, chronically ill appearing, frail, elderly male Heart:RRR Lungs:CTAB, BS decreased in bases, nml WOB Abdomen:soft, NTND, +BS Extremities:no LE edema Dialysis Access: [facility]   Filed Weights   03/12/20 0724 03/12/20 1110 03/13/20 2048  Weight: 64.9 kg 62.7 kg 66.2 kg    Intake/Output Summary (Last 24 hours) at 03/14/2020 1123 Last data filed at 03/14/2020 0946 Gross per 24 hour  Intake 2558.18 ml  Output 0 ml  Net 2558.18 ml    Additional Objective Labs: Basic Metabolic Panel: Recent Labs  Lab 03/12/20 0814 03/13/20 0442 03/14/20 0811  NA 133* 137 136  K 3.3* 3.8 4.1  CL 97* 96* 95*  CO2 28 28 25   GLUCOSE 99 100* 88  BUN 11 16 34*  CREATININE 4.34* 3.17* 4.92*  CALCIUM 8.7* 9.5 10.5*  PHOS 1.9* 2.2* 2.5   Liver Function Tests: Recent Labs  Lab 03/08/20 0303 03/08/20 0303 03/09/20 0403 03/10/20 1204 03/11/20 0500 03/12/20 0814 03/13/20 1715 03/14/20 0811  AST 25  --  26  --   --   --   --   --   ALT 12  --  13  --   --   --   --   --   ALKPHOS 56  --  54  --   --   --   --   --   BILITOT 1.4*  --  1.4*  --   --   --   --   --   PROT 5.8*   < > 5.8*  --   --   --  6.3* 7.2  ALBUMIN 3.2*   < > 3.0*   < > 3.1* 2.9*  --  3.5   < > = values in this interval not displayed.   CBC: Recent Labs  Lab 03/09/20 0403 03/09/20 0403 03/10/20 1204 03/10/20 1204  03/11/20 0500 03/12/20 0815 03/14/20 0811  WBC 12.6*   < > 11.3*   < > 10.9* 9.7 16.0*  HGB 8.9*   < > 9.9*   < > 9.4* 9.6* 11.6*  HCT 26.4*   < > 30.7*   < > 29.3* 30.3* 37.7*  MCV 94.0  --  96.2  --  96.1 98.1 101.3*  PLT 225   < > 281   < > 265 280 280   < > = values in this interval not displayed.   Blood Culture    Component Value Date/Time   SDES PLEURAL FLUID RIGHT 03/13/2020 1644   SPECREQUEST Normal 03/13/2020 1644   CULT  03/13/2020 1644    NO GROWTH < 24 HOURS Performed at [facility], [place] [address], [place], [place] [address]    REPTSTATUS PENDING 03/13/2020 [place]     Recent Labs  Lab 03/14/20 [phone] 03/14/20  1308 03/14/20 0854 03/14/20 0947 03/14/20 1040  GLUCAP 25* 93 50* 37* 54*   Studies/Results: CT CHEST WO CONTRAST  Result Date: 03/13/2020 CLINICAL DATA:  Bilateral pleural effusions, abnormal chest x-ray EXAM: CT CHEST WITHOUT CONTRAST TECHNIQUE: Multidetector CT imaging of the chest was performed following the standard protocol without IV contrast. COMPARISON:  03/06/2020 FINDINGS: Cardiovascular: Heart is stable without pericardial effusion. Extensive calcification of the mitral and aortic valves. Stable atherosclerosis of the thoracic aorta and coronary vessels. Mediastinum/Nodes: No enlarged mediastinal or axillary lymph nodes. Thyroid gland, trachea, and esophagus demonstrate no significant findings. Lungs/Pleura: There are small bilateral pleural effusions, right greater than left. Overall, slight increased since prior study, estimated less than 500 cc on the left and less than 1 L on the right. Dependent atelectasis within the bilateral lower lobes. No airspace disease or pneumothorax. The central airways are patent. Upper Abdomen: Stable appearance of the upper abdomen. Musculoskeletal: No acute or destructive bony lesions. Reconstructed images demonstrate no additional findings. There is a right internal jugular catheter tip within the superior vena  cava. IMPRESSION: 1. Slight enlargement of bilateral pleural effusions, right greater than left. 2. Compressive atelectasis bilateral lower lobes. 3.  Aortic Atherosclerosis (ICD10-I70.0). Electronically Signed   By: Randa Ngo M.D.   On: 03/13/2020 00:32   DG Chest Port 1 View  Result Date: 03/13/2020 CLINICAL DATA:  Status post thoracentesis EXAM: PORTABLE CHEST 1 VIEW COMPARISON:  Mar 10, 2020 FINDINGS: There is a well-positioned tunneled dialysis catheter. The right-sided pleural effusion has decreased in size since the prior study. There is no evidence for a right-sided pneumothorax. There may be a small left-sided pleural effusion. There is bibasilar atelectasis. The heart size remains enlarged. A vascular stent again projects over the left lung apex. Atherosclerotic changes are noted of the thoracic aorta. IMPRESSION: 1. No pneumothorax status post thoracentesis. 2. Bibasilar atelectasis. 3. Stable cardiomegaly. Electronically Signed   By: Constance Holster M.D.   On: 03/13/2020 18:19    Medications:  . allopurinol  100 mg Oral Daily  . aspirin EC  81 mg Oral Daily  . calcitRIOL  1.5 mcg Oral Q M,W,F-HD  . Chlorhexidine Gluconate Cloth  6 each Topical Q0600  . cinacalcet  30 mg Oral Q M,W,F-HD  . citalopram  10 mg Oral Daily  . darbepoetin (ARANESP) injection - DIALYSIS  60 mcg Intravenous Q Thu-HD  . feeding supplement (NEPRO CARB STEADY)  237 mL Oral TID BM  . feeding supplement (PRO-STAT SUGAR FREE 64)  30 mL Oral BID  . BioGaia Probiotic  5 drop Oral Q2000  . latanoprost  1 drop Both Eyes QHS  . midodrine  10 mg Oral TID  . multivitamin  1 tablet Oral QHS  . predniSONE  50 mg Oral Q breakfast  . rosuvastatin  20 mg Oral QHS    Dialysis Orders: [facility]- MWF 4 hours, 400 BFR via TDC EDW 66- 2 K, 2 calc bath. No heparin  Calcitriol 1.5 mcg and sensipar 30 q HD, mircera 30  Assessment/Plan: 1.Confusion- waxing and waning, baseline dementia, hypoglycemic on arrival,  +UTI, recent SDH. Rocephin given.  2.Hypoglycemia- per primary  3. ESRD-on HD MWF. No Heparin. Next HD 5/10. K 4.1 today, plan to use 4K bath. 4. Anemiaof CKD-Hgb 11.6. ESA dosed on 5/3 5. Secondary hyperparathyroidism- Ca a little elevated, if continues will decrease calcitriol.Phos improved to 2.5 s/p IV repletion.Hold binders.Continue sensipar and VDRA. 6.HTN/volume- Blood pressure mostly well controlled. Does not appear volume overloaded.  Titrate down volume as tolerated. Request standing or hoyer weight to better assess edw, difference ofalmost 20kg recorded. UF removed yesterday 2L.If weights correct today, under EDW, likely need to lower at d/c.  7. Nutrition- Renal diet w/fluid restrictions.   Jen Mow, PA-C [facility] Pager: [phone] 03/14/2020,11:23 AM  LOS: 3 days

## 2020-03-14 NOTE — Plan of Care (Signed)
  Problem: Clinical Measurements: Goal: Cardiovascular complication will be avoided Outcome: Progressing   Problem: Nutrition: Goal: Adequate nutrition will be maintained Outcome: Progressing   

## 2020-03-14 NOTE — Progress Notes (Signed)
Patient transferred to chair with 2 assists , noted to be weak and unable to makes steps or ambulate. Patient up on the chair for dinner. nurses'aide assisted patient with feeding. Tolerated being up on chair.

## 2020-03-14 NOTE — Progress Notes (Signed)
PROGRESS NOTE        PATIENT DETAILS Name: Gabriel Soules Reisner Sr. Age: 86 y.o. Sex: male Date of Birth: [birth year] Admit Date: 03/10/2020 Admitting Physician Evalee Mutton Kristeen Mans, MD MEQ:ASTM, Hunt Oris, MD  Brief Narrative: Patient is a 86 y.o. male with history of ESRD on HD MWF, HLD, anemia of chronic disease, orthostatic hypotension, depression/dementia who was found to be confused (more than usual) at HD and sent to the hospital for further evaluation.  See below for further details.  Significant events: 5/5>> readmit to [facility] for evaluation of encephalopathy/hypoglycemia. 5/1-5/4>> admit to [facility]-frequent falls-found to have SDH, left hip hematoma, acute blood loss anemia requiring 2 units of PRBC  Antimicrobial therapy: Rocephin: 5/5>>5/8  Microbiology data: None  Procedures :     CT -  1. Slight enlargement of bilateral pleural effusions, right greater than left. 2. Compressive atelectasis bilateral lower lobes. 3.  Aortic Atherosclerosis    5/8>> CT chest: Slight enlargement of bilateral pleural effusions, compressive atelectasis bilateral lower lobes 5/8>> CT head: Minimal decrease in SDH  Consults: Nephrology PCCM  DVT Prophylaxis : SCD's  Subjective:  Patient in bed, appears comfortable, denies any headache, no fever, no chest pain or pressure, no shortness of breath , no abdominal pain. No focal weakness.   Assessment/Plan:  Recurrent hypoglycemia: Continues to recur-etiology uncertain -  suspect that this is from frailty/poor oral intake/underlying ESRD.  But given recurrent hypoglycemia (was also occurring last admission)- pending C-peptide/proinsulin/sulfonylureas level (pending).  Given failure to thrive syndrome/poor appetite-we will perform a ACTH stimulation test seems unremarkable with first cortisol level being around 21, ACTH level added in today's lab, stable A1c along with stable TSH.  Stop IV fluids, encourage oral diet, for now  low-dose prednisone and monitor.   Recent Labs    03/14/20 0947 03/14/20 1040 03/14/20 1219  GLUCAP 37* 54* 103*    Acute metabolic encephalopathy: Does have underlying dementia-but mental status has improved compared to on admission.  Suspect etiology of encephalopathy to be a combination of hypoglycemia, recent SDH/ongoing UTI/infection.  CT head on admission showed decreased size of the SDH.  Continue supportive care.    Complicated UTI/Asp PNA with persistent bilateral pleural effusion (right>>left): Thought to have UTI/asp PNA last admission-and discharged on Augmentin-has been on Rocephin since admission-CT chest done yesterday shows mostly atelectasis-but still shows a pleural effusion.  He has not missed HD-volume status is stable-not sure if this is a parapneumonic effusion/exudative effusion-or a transudate related to hemodialysis etc.    Given his ongoing failure to thrive syndrome/hypoglycemia-and new pleural effusion (not present on imaging studies in 2020)-PCCM was consulted and patient underwent right-sided ultrasound-guided thoracentesis on 03/13/2020, prelim studies suggest transudate.  LDH pending.  Currently off of antibiotics will monitor closely.   ESRD on HD MWF: Nephrology following and directing care  Recent SDH: Following a mechanical fall (last admission)-repeat CT head this admission shows decreased size of SDH.  Recent left hip hematoma: Following a mechanical fall (last admission)-supportive care.  Hemoglobin currently stable-Did require PRBC transfusion last admission.  Anemia: Related to ESRD-hemoglobin stable with no need for transfusion.  Orthostatic hypotension: Continue midodrine-follow.  Depression: Continue Celexa  History of dementia: Stable-encephalopathy improving-suspect mentation not far from usual baseline.  Gout: No flare-continue allopurinol  Diet: Diet Order            DIET -  DYS 1 Room service appropriate? No; Fluid consistency: Thin   Diet effective now               Code Status: Full code  Family Communication: Patient's son ([id number]) on 03/14/20   Disposition Plan: Status is: Inpatient  Remains inpatient appropriate because:Altered mental status  Dispo: The patient is from: Home              Anticipated d/c is to: Home              Anticipated d/c date is: 2 days              Patient currently is not medically stable to d/c.   Barriers to Discharge: Hypoglycemia/ongoing encephalopathy/recurrent pleural effusion   Antimicrobial agents: Anti-infectives (From admission, onward)   Start     Dose/Rate Route Frequency Ordered Stop   03/10/20 2200  amoxicillin-clavulanate (AUGMENTIN) 500-125 MG per tablet 500 mg  Status:  Discontinued     500 mg Oral Daily at bedtime 03/10/20 1500 03/10/20 1752   03/10/20 1800  cefTRIAXone (ROCEPHIN) 2 g in sodium chloride 0.9 % 100 mL IVPB  Status:  Discontinued     2 g 200 mL/hr over 30 Minutes Intravenous Every 24 hours 03/10/20 1752 03/13/20 1421       Time spent: 25 minutes-Greater than 50% of this time was spent in counseling, explanation of diagnosis, planning of further management, and coordination of care.  MEDICATIONS: Scheduled Meds: . allopurinol  100 mg Oral Daily  . aspirin EC  81 mg Oral Daily  . calcitRIOL  1.5 mcg Oral Q M,W,F-HD  . Chlorhexidine Gluconate Cloth  6 each Topical Q0600  . Chlorhexidine Gluconate Cloth  6 each Topical Q0600  . cinacalcet  30 mg Oral Q M,W,F-HD  . citalopram  10 mg Oral Daily  . darbepoetin (ARANESP) injection - DIALYSIS  60 mcg Intravenous Q Thu-HD  . feeding supplement (NEPRO CARB STEADY)  237 mL Oral TID BM  . feeding supplement (PRO-STAT SUGAR FREE 64)  30 mL Oral BID  . BioGaia Probiotic  5 drop Oral Q2000  . latanoprost  1 drop Both Eyes QHS  . midodrine  10 mg Oral TID  . multivitamin  1 tablet Oral QHS  . predniSONE  50 mg Oral Q breakfast  . rosuvastatin  20 mg Oral QHS   Continuous Infusions:  PRN  Meds:.acetaminophen **OR** [DISCONTINUED] acetaminophen, albuterol, nystatin, sodium chloride flush   PHYSICAL EXAM: Vital signs: Vitals:   03/13/20 2048 03/14/20 0616 03/14/20 1110 03/14/20 1117  BP: (!) 116/53 (!) 148/65 (!) 147/60   Pulse: 92 89 68   Resp: 18 16 18    Temp: 98.2 F (36.8 C) 98.3 F (36.8 C) 98.2 F (36.8 C)   TempSrc: Oral Oral Oral   SpO2: 97% 97% (!) 76% (!) 82%  Weight: 66.2 kg     Height:       Filed Weights   03/12/20 0724 03/12/20 1110 03/13/20 2048  Weight: 64.9 kg 62.7 kg 66.2 kg   Body mass index is 21.56 kg/m.   EXAM  Awake Alert, No new F.N deficits,   [place].AT,PERRAL Supple Neck,No JVD, No cervical lymphadenopathy appriciated.  Symmetrical Chest wall movement, Good air movement bilaterally, CTAB RRR,No Gallops, Rubs or new Murmurs, No Parasternal Heave +ve B.Sounds, Abd Soft, No tenderness, No organomegaly appriciated, No rebound - guarding or rigidity. No Cyanosis, Clubbing or edema, No new Rash or bruise   I have personally reviewed following  labs and imaging studies  LABORATORY DATA: CBC: Recent Labs  Lab 03/09/20 0403 03/10/20 1204 03/11/20 0500 03/12/20 0815 03/14/20 0811  WBC 12.6* 11.3* 10.9* 9.7 16.0*  HGB 8.9* 9.9* 9.4* 9.6* 11.6*  HCT 26.4* 30.7* 29.3* 30.3* 37.7*  MCV 94.0 96.2 96.1 98.1 101.3*  PLT 225 281 265 280 789    Basic Metabolic Panel: Recent Labs  Lab 03/08/20 0303 03/08/20 1303 03/09/20 0403 03/09/20 0403 03/10/20 1204 03/11/20 0500 03/12/20 0814 03/13/20 0442 03/14/20 0811  NA 136  --  134*   < > 135 131* 133* 137 136  K 3.9  --  3.9   < > 4.4 4.2 3.3* 3.8 4.1  CL 93*  --  92*   < > 93* 88* 97* 96* 95*  CO2 31  --  28   < > 28 28 28 28 25   GLUCOSE 96   < > 101*   < > 95 90 99 100* 88  BUN 33*  --  17   < > 31* 34* 11 16 34*  CREATININE 7.37*  --  4.57*   < > 6.63* 7.38* 4.34* 3.17* 4.92*  CALCIUM 9.4  --  9.1   < > 9.9 9.5 8.7* 9.5 10.5*  MG 2.1  --  2.0  --   --   --   --   --  2.4  PHOS  3.2  --   --   --  2.8 2.9 1.9* 2.2* 2.5   < > = values in this interval not displayed.    GFR: Estimated Creatinine Clearance: 9.7 mL/min (A) (by C-G formula based on SCr of 4.92 mg/dL (H)).  Liver Function Tests: Recent Labs  Lab 03/08/20 0303 03/08/20 0303 03/09/20 0403 03/10/20 1204 03/11/20 0500 03/12/20 0814 03/13/20 1715 03/14/20 0811  AST 25  --  26  --   --   --   --   --   ALT 12  --  13  --   --   --   --   --   ALKPHOS 56  --  54  --   --   --   --   --   BILITOT 1.4*  --  1.4*  --   --   --   --   --   PROT 5.8*  --  5.8*  --   --   --  6.3* 7.2  ALBUMIN 3.2*   < > 3.0* 3.4* 3.1* 2.9*  --  3.5   < > = values in this interval not displayed.   No results for input(s): LIPASE, AMYLASE in the last 168 hours. No results for input(s): AMMONIA in the last 168 hours.  Coagulation Profile: No results for input(s): INR, PROTIME in the last 168 hours.  Cardiac Enzymes: No results for input(s): CKTOTAL, CKMB, CKMBINDEX, TROPONINI in the last 168 hours.  BNP (last 3 results) No results for input(s): PROBNP in the last 8760 hours.  Lipid Profile: No results for input(s): CHOL, HDL, LDLCALC, TRIG, CHOLHDL, LDLDIRECT in the last 72 hours.  Thyroid Function Tests: Recent Labs    03/14/20 0811  TSH 1.415    Anemia Panel: No results for input(s): VITAMINB12, FOLATE, FERRITIN, TIBC, IRON, RETICCTPCT in the last 72 hours.  Urine analysis:    Component Value Date/Time   COLORURINE YELLOW 03/07/2020 1128   APPEARANCEUR HAZY (A) 03/07/2020 1128   LABSPEC 1.008 03/07/2020 1128   PHURINE 9.0 (H) 03/07/2020 1128   GLUCOSEU  NEGATIVE 03/07/2020 1128   GLUCOSEU NEGATIVE 01/30/2020 1518   HGBUR SMALL (A) 03/07/2020 1128   BILIRUBINUR NEGATIVE 03/07/2020 1128   KETONESUR NEGATIVE 03/07/2020 1128   PROTEINUR 100 (A) 03/07/2020 1128   UROBILINOGEN 0.2 01/30/2020 1518   NITRITE NEGATIVE 03/07/2020 1128   LEUKOCYTESUR LARGE (A) 03/07/2020 1128    Sepsis Labs: Lactic  Acid, Venous    Component Value Date/Time   LATICACIDVEN 0.66 12/08/2016 0605    MICROBIOLOGY: Recent Results (from the past 240 hour(s))  Urine culture     Status: None   Collection Time: 03/06/20  1:11 PM   Specimen: Urine, Random  Result Value Ref Range Status   Specimen Description URINE, RANDOM  Final   Special Requests NONE  Final   Culture   Final    NO GROWTH Performed at [facility], [place] [address], [place], [place] [address]    Report Status 03/08/2020 FINAL  Final  Blood culture (routine x 2)     Status: None   Collection Time: 03/06/20  2:37 PM   Specimen: BLOOD RIGHT FOREARM  Result Value Ref Range Status   Specimen Description BLOOD RIGHT FOREARM  Final   Special Requests   Final    BOTTLES DRAWN AEROBIC AND ANAEROBIC Blood Culture results may not be optimal due to an inadequate volume of blood received in culture bottles   Culture   Final    NO GROWTH 5 DAYS Performed at [facility], [place] [address], [place], [place] [address]    Report Status 03/11/2020 FINAL  Final  Blood culture (routine x 2)     Status: None   Collection Time: 03/06/20  3:15 PM   Specimen: BLOOD  Result Value Ref Range Status   Specimen Description BLOOD RIGHT ANTECUBITAL  Final   Special Requests   Final    BOTTLES DRAWN AEROBIC AND ANAEROBIC Blood Culture results may not be optimal due to an inadequate volume of blood received in culture bottles   Culture   Final    NO GROWTH 5 DAYS Performed at [facility], [place] [address] [place], [place] [address]    Report Status 03/11/2020 FINAL  Final  Respiratory Panel by RT PCR (Flu A&B, Covid) - Nasopharyngeal Swab     Status: None   Collection Time: 03/06/20 11:15 PM   Specimen: Nasopharyngeal Swab  Result Value Ref Range Status   SARS Coronavirus 2 by RT PCR NEGATIVE NEGATIVE Final    Comment: (NOTE) SARS-CoV-2 target nucleic acids are NOT DETECTED. The SARS-CoV-2 RNA is generally detectable in upper  respiratoy specimens during the acute phase of infection. The lowest concentration of SARS-CoV-2 viral copies this assay can detect is 131 copies/mL. A negative result does not preclude SARS-Cov-2 infection and should not be used as the sole basis for treatment or other patient management decisions. A negative result may occur with  improper specimen collection/handling, submission of specimen other than nasopharyngeal swab, presence of viral mutation(s) within the areas targeted by this assay, and inadequate number of viral copies (<131 copies/mL). A negative result must be combined with clinical observations, patient history, and epidemiological information. The expected result is Negative. Fact Sheet for Patients:  PinkCheek.be Fact Sheet for Healthcare Providers:  GravelBags.it This test is not yet ap proved or cleared by the [place] FDA and  has been authorized for detection and/or diagnosis of SARS-CoV-2 by FDA under an Emergency Use Authorization (EUA). This EUA will remain  in effect (  meaning this test can be used) for the duration of the COVID-19 declaration under Section 564(b)(1) of the Act, 21 U.S.C. section 360bbb-3(b)(1), unless the authorization is terminated or revoked sooner.    Influenza A by PCR NEGATIVE NEGATIVE Final   Influenza B by PCR NEGATIVE NEGATIVE Final    Comment: (NOTE) The Xpert Xpress SARS-CoV-2/FLU/RSV assay is intended as an aid in  the diagnosis of influenza from Nasopharyngeal swab specimens and  should not be used as a sole basis for treatment. Nasal washings and  aspirates are unacceptable for Xpert Xpress SARS-CoV-2/FLU/RSV  testing. Fact Sheet for Patients: PinkCheek.be Fact Sheet for Healthcare Providers: GravelBags.it This test is not yet approved or cleared by the [place] FDA and  has been authorized for  detection and/or diagnosis of SARS-CoV-2 by  FDA under an Emergency Use Authorization (EUA). This EUA will remain  in effect (meaning this test can be used) for the duration of the  Covid-19 declaration under Section 564(b)(1) of the Act, 21  U.S.C. section 360bbb-3(b)(1), unless the authorization is  terminated or revoked. Performed at [facility], [place] [address], [place], [place] [address]   SARS CORONAVIRUS 2 (TAT 6-24 HRS) Nasopharyngeal Nasopharyngeal Swab     Status: None   Collection Time: 03/10/20  1:20 PM   Specimen: Nasopharyngeal Swab  Result Value Ref Range Status   SARS Coronavirus 2 NEGATIVE NEGATIVE Final    Comment: (NOTE) SARS-CoV-2 target nucleic acids are NOT DETECTED. The SARS-CoV-2 RNA is generally detectable in upper and lower respiratory specimens during the acute phase of infection. Negative results do not preclude SARS-CoV-2 infection, do not rule out co-infections with other pathogens, and should not be used as the sole basis for treatment or other patient management decisions. Negative results must be combined with clinical observations, patient history, and epidemiological information. The expected result is Negative. Fact Sheet for Patients: SugarRoll.be Fact Sheet for Healthcare Providers: https://www.woods-mathews.com/ This test is not yet approved or cleared by the [place] FDA and  has been authorized for detection and/or diagnosis of SARS-CoV-2 by FDA under an Emergency Use Authorization (EUA). This EUA will remain  in effect (meaning this test can be used) for the duration of the COVID-19 declaration under Section 56 4(b)(1) of the Act, 21 U.S.C. section 360bbb-3(b)(1), unless the authorization is terminated or revoked sooner. Performed at [facility], [place] [address] [place], [place] [address]   MRSA PCR Screening     Status: None   Collection Time: 03/11/20  3:06 AM   Specimen:  Nasal Mucosa; Nasopharyngeal  Result Value Ref Range Status   MRSA by PCR NEGATIVE NEGATIVE Final    Comment:        The GeneXpert MRSA Assay (FDA approved for NASAL specimens only), is one component of a comprehensive MRSA colonization surveillance program. It is not intended to diagnose MRSA infection nor to guide or monitor treatment for MRSA infections. Performed at [facility], [place] [address], [place], [place] [address]   Body fluid culture     Status: None (Preliminary result)   Collection Time: 03/13/20  4:44 PM   Specimen: Pleura; Body Fluid  Result Value Ref Range Status   Specimen Description PLEURAL FLUID RIGHT  Final   Special Requests Normal  Final   Gram Stain   Final    CYTOSPIN SMEAR WBC PRESENT, PREDOMINANTLY MONONUCLEAR NO ORGANISMS SEEN    Culture   Final    NO GROWTH < 24 HOURS  Performed at [facility], [place] [address], [place], [place] [address]    Report Status PENDING  Incomplete    RADIOLOGY STUDIES/RESULTS: CT CHEST WO CONTRAST  Result Date: 03/13/2020 CLINICAL DATA:  Bilateral pleural effusions, abnormal chest x-ray EXAM: CT CHEST WITHOUT CONTRAST TECHNIQUE: Multidetector CT imaging of the chest was performed following the standard protocol without IV contrast. COMPARISON:  03/06/2020 FINDINGS: Cardiovascular: Heart is stable without pericardial effusion. Extensive calcification of the mitral and aortic valves. Stable atherosclerosis of the thoracic aorta and coronary vessels. Mediastinum/Nodes: No enlarged mediastinal or axillary lymph nodes. Thyroid gland, trachea, and esophagus demonstrate no significant findings. Lungs/Pleura: There are small bilateral pleural effusions, right greater than left. Overall, slight increased since prior study, estimated less than 500 cc on the left and less than 1 L on the right. Dependent atelectasis within the bilateral lower lobes. No airspace disease or pneumothorax. The central airways are patent. Upper  Abdomen: Stable appearance of the upper abdomen. Musculoskeletal: No acute or destructive bony lesions. Reconstructed images demonstrate no additional findings. There is a right internal jugular catheter tip within the superior vena cava. IMPRESSION: 1. Slight enlargement of bilateral pleural effusions, right greater than left. 2. Compressive atelectasis bilateral lower lobes. 3.  Aortic Atherosclerosis (ICD10-I70.0). Electronically Signed   By: Randa Ngo M.D.   On: 03/13/2020 00:32   DG Chest Port 1 View  Result Date: 03/13/2020 CLINICAL DATA:  Status post thoracentesis EXAM: PORTABLE CHEST 1 VIEW COMPARISON:  Mar 10, 2020 FINDINGS: There is a well-positioned tunneled dialysis catheter. The right-sided pleural effusion has decreased in size since the prior study. There is no evidence for a right-sided pneumothorax. There may be a small left-sided pleural effusion. There is bibasilar atelectasis. The heart size remains enlarged. A vascular stent again projects over the left lung apex. Atherosclerotic changes are noted of the thoracic aorta. IMPRESSION: 1. No pneumothorax status post thoracentesis. 2. Bibasilar atelectasis. 3. Stable cardiomegaly. Electronically Signed   By: Constance Holster M.D.   On: 03/13/2020 18:19     LOS: 3 days   Signature  Lala Lund M.D on 03/14/2020 at 3:03 PM   -  To page go to [email or web address]

## 2020-03-15 ENCOUNTER — Inpatient Hospital Stay (HOSPITAL_COMMUNITY): Payer: Medicare Other

## 2020-03-15 LAB — GLUCOSE, CAPILLARY
Glucose-Capillary: 92 mg/dL (ref 70–99)
Glucose-Capillary: 76 mg/dL (ref 70–99)
Glucose-Capillary: 102 mg/dL — ABNORMAL HIGH (ref 70–99)
Glucose-Capillary: 97 mg/dL (ref 70–99)
Glucose-Capillary: 51 mg/dL — ABNORMAL LOW (ref 70–99)
Glucose-Capillary: 59 mg/dL — ABNORMAL LOW (ref 70–99)
Glucose-Capillary: 48 mg/dL — ABNORMAL LOW (ref 70–99)
Glucose-Capillary: 94 mg/dL (ref 70–99)
Glucose-Capillary: 91 mg/dL (ref 70–99)
Glucose-Capillary: 121 mg/dL — ABNORMAL HIGH (ref 70–99)

## 2020-03-15 LAB — CBC WITH DIFFERENTIAL/PLATELET
Abs Immature Granulocytes: 0.31 10*3/uL — ABNORMAL HIGH (ref 0.00–0.07)
Basophils Absolute: 0.1 10*3/uL (ref 0.0–0.1)
Basophils Relative: 0 %
Eosinophils Absolute: 0.3 10*3/uL (ref 0.0–0.5)
Eosinophils Relative: 2 %
HCT: 30 % — ABNORMAL LOW (ref 39.0–52.0)
Hemoglobin: 9.2 g/dL — ABNORMAL LOW (ref 13.0–17.0)
Immature Granulocytes: 2 %
Lymphocytes Relative: 8 %
Lymphs Abs: 1.2 10*3/uL (ref 0.7–4.0)
MCH: 31.2 pg (ref 26.0–34.0)
MCHC: 30.7 g/dL (ref 30.0–36.0)
MCV: 101.7 fL — ABNORMAL HIGH (ref 80.0–100.0)
Monocytes Absolute: 1.4 10*3/uL — ABNORMAL HIGH (ref 0.1–1.0)
Monocytes Relative: 9 %
Neutro Abs: 12.4 10*3/uL — ABNORMAL HIGH (ref 1.7–7.7)
Neutrophils Relative %: 79 %
Platelets: 292 10*3/uL (ref 150–400)
RBC: 2.95 MIL/uL — ABNORMAL LOW (ref 4.22–5.81)
RDW: 18.9 % — ABNORMAL HIGH (ref 11.5–15.5)
WBC: 15.7 10*3/uL — ABNORMAL HIGH (ref 4.0–10.5)
nRBC: 0.4 % — ABNORMAL HIGH (ref 0.0–0.2)

## 2020-03-15 LAB — COMPREHENSIVE METABOLIC PANEL
ALT: 28 U/L (ref 0–44)
AST: 47 U/L — ABNORMAL HIGH (ref 15–41)
Albumin: 3 g/dL — ABNORMAL LOW (ref 3.5–5.0)
Alkaline Phosphatase: 68 U/L (ref 38–126)
Anion gap: 10 (ref 5–15)
BUN: 49 mg/dL — ABNORMAL HIGH (ref 8–23)
CO2: 30 mmol/L (ref 22–32)
Calcium: 9.8 mg/dL (ref 8.9–10.3)
Chloride: 95 mmol/L — ABNORMAL LOW (ref 98–111)
Creatinine, Ser: 6.85 mg/dL — ABNORMAL HIGH (ref 0.61–1.24)
GFR calc Af Amer: 8 mL/min — ABNORMAL LOW (ref >60)
GFR calc non Af Amer: 7 mL/min — ABNORMAL LOW (ref >60)
Glucose, Bld: 101 mg/dL — ABNORMAL HIGH (ref 70–99)
Potassium: 4 mmol/L (ref 3.5–5.1)
Sodium: 135 mmol/L (ref 135–145)
Total Bilirubin: 0.7 mg/dL (ref 0.3–1.2)
Total Protein: 5.9 g/dL — ABNORMAL LOW (ref 6.5–8.1)

## 2020-03-15 LAB — MAGNESIUM: Magnesium: 2.6 mg/dL — ABNORMAL HIGH (ref 1.7–2.4)

## 2020-03-15 LAB — PROCALCITONIN: Procalcitonin: 1.55 ng/mL

## 2020-03-15 LAB — PH, BODY FLUID: pH, Body Fluid: 7.7

## 2020-03-15 LAB — CYTOLOGY - NON PAP

## 2020-03-15 LAB — BRAIN NATRIURETIC PEPTIDE: B Natriuretic Peptide: 1269.2 pg/mL — ABNORMAL HIGH (ref 0.0–100.0)

## 2020-03-15 MED ORDER — DEXTROSE 50 % IV SOLN
INTRAVENOUS | Status: AC
  Filled 2020-03-15: qty 50

## 2020-03-15 MED ORDER — IOHEXOL 300 MG/ML  SOLN
100.0000 mL | Freq: Once | INTRAMUSCULAR | Status: AC | PRN
  Administered 2020-03-15: 100 mL via INTRAVENOUS

## 2020-03-15 MED ORDER — HEPARIN SODIUM (PORCINE) 1000 UNIT/ML IJ SOLN
INTRAMUSCULAR | Status: AC
  Administered 2020-03-15: 3800 [IU]
  Filled 2020-03-15: qty 4

## 2020-03-15 MED ORDER — CALCITRIOL 0.5 MCG PO CAPS
1.0000 ug | ORAL_CAPSULE | ORAL | Status: DC
  Administered 2020-03-15 – 2020-03-19 (×3): 1 ug via ORAL
  Filled 2020-03-15 (×2): qty 2

## 2020-03-15 MED ORDER — MIDODRINE HCL 5 MG PO TABS
ORAL_TABLET | ORAL | Status: AC
  Filled 2020-03-15: qty 2

## 2020-03-15 MED ORDER — GLUCOSE 40 % PO GEL
ORAL | Status: AC
  Filled 2020-03-15: qty 1

## 2020-03-15 NOTE — Progress Notes (Signed)
PROGRESS NOTE        PATIENT DETAILS Name: Gabriel Deharo Divita Sr. Age: 86 y.o. Sex: male Date of Birth: 08/30/1929 Admit Date: 03/10/2020 Admitting Physician Evalee Mutton Kristeen Mans, MD RDE:YCXK, Hunt Oris, MD  Brief Narrative:  Patient is a 86 y.o. male with history of ESRD on HD MWF, HLD, anemia of chronic disease, orthostatic hypotension, depression/dementia who was found to be confused (more than usual) at HD and sent to the hospital for further evaluation.  See below for further details.  Significant events:  5/5>> readmit to [facility] for evaluation of encephalopathy/hypoglycemia. 5/1-5/4>> admit to [facility]-frequent falls-found to have SDH, left hip hematoma, acute blood loss anemia requiring 2 units of PRBC  Antimicrobial therapy:  Rocephin: 5/5>>5/8  Microbiology data: None  Procedures :  5/8 >>  Right-sided ultrasound-guided thoracentesis by pulmonary critical care, transudative fluid  5/8>> CT chest: Slight enlargement of bilateral pleural effusions, compressive atelectasis bilateral lower lobes 5/8>> CT head: Minimal decrease in SDH  Consults:  Nephrology PCCM  DVT Prophylaxis :  SCD's  Subjective:  Patient in bed undergoing dialysis denies any headache chest or abdominal pain.  Not short of breath.  Assessment/Plan:  Recurrent hypoglycemia: Continues to recur-etiology uncertain -  suspect that this is from frailty/poor oral intake/underlying ESRD.  But given recurrent hypoglycemia (was also occurring last admission) - pending  ACTH, C-peptide, proinsulin, sulfonylureas level. Stable A1c along with stable TSH, stable ACTH stimulation test.  C-peptide was quite high but likely he was on low-dose D5 drip at that time however it still quite significant, will get CT abdomen pelvis with and without contrast to rule out an obvious pancreatic lesion, discussed with endocrinologist Dr. [name] on 03/15/2020, continue oral prednisone for now with outpatient  endocrine follow-up.   Recent Labs    03/14/20 2035 03/15/20 0101 03/15/20 0436  GLUCAP 115* 92 76    Acute metabolic encephalopathy: Does have underlying dementia-but mental status has improved compared to on admission.  Suspect etiology of encephalopathy to be a combination of hypoglycemia, recent SDH/ongoing UTI/infection.  CT head on admission showed decreased size of the SDH.  Continue supportive care.    Complicated UTI/Asp PNA with persistent bilateral pleural effusion (right>>left): Thought to have UTI/asp PNA last admission-and discharged on Augmentin-has been on Rocephin since admission-CT chest done yesterday shows mostly atelectasis-but still shows a pleural effusion.  He has not missed HD-volume status is stable-not sure if this is a parapneumonic effusion/exudative effusion-or a transudate related to hemodialysis etc.    Given his ongoing failure to thrive syndrome/hypoglycemia-and new pleural effusion (not present on imaging studies in 2020)-PCCM was consulted and patient underwent right-sided ultrasound-guided thoracentesis on 03/13/2020, with transudative fluid removed.  Currently off of antibiotics will monitor closely.   ESRD on HD MWF: Nephrology following and directing care  Recent SDH: Following a mechanical fall (last admission)-repeat CT head this admission shows decreased size of SDH.  Recent left hip hematoma: Following a mechanical fall (last admission)-supportive care.  Hemoglobin currently stable-Did require PRBC transfusion last admission.  Anemia: Related to ESRD-hemoglobin stable with no need for transfusion.  Orthostatic hypotension: Continue midodrine-follow.  Depression: Continue Celexa  History of dementia: Stable-encephalopathy improving-suspect mentation not far from usual baseline.  Gout: No flare-continue allopurinol  Diet: Diet Order            DIET - DYS 1 Room  service appropriate? No; Fluid consistency: Thin  Diet effective now                 Code Status: Full code  Family Communication:  Patient's son Athen [phone]) on 03/14/20, 03/15/20   Disposition Plan: Status is: Inpatient  Remains inpatient appropriate because:Altered mental status  Dispo: The patient is from: Home              Anticipated d/c is to: Home              Anticipated d/c date is: 2 days              Patient currently is not medically stable to d/c.   Barriers to Discharge: Hypoglycemia/ongoing encephalopathy/recurrent pleural effusion   Antimicrobial agents: Anti-infectives (From admission, onward)   Start     Dose/Rate Route Frequency Ordered Stop   03/10/20 2200  amoxicillin-clavulanate (AUGMENTIN) 500-125 MG per tablet 500 mg  Status:  Discontinued     500 mg Oral Daily at bedtime 03/10/20 1500 03/10/20 1752   03/10/20 1800  cefTRIAXone (ROCEPHIN) 2 g in sodium chloride 0.9 % 100 mL IVPB  Status:  Discontinued     2 g 200 mL/hr over 30 Minutes Intravenous Every 24 hours 03/10/20 1752 03/13/20 1421       Time spent:  25 minutes-Greater than 50% of this time was spent in counseling, explanation of diagnosis, planning of further management, and coordination of care.  MEDICATIONS:  Scheduled Meds:  . heparin sodium (porcine)      . allopurinol  100 mg Oral Daily  . aspirin EC  81 mg Oral Daily  . calcitRIOL  1 mcg Oral Q M,W,F-HD  . Chlorhexidine Gluconate Cloth  6 each Topical Q0600  . Chlorhexidine Gluconate Cloth  6 each Topical Q0600  . cinacalcet  30 mg Oral Q M,W,F-HD  . citalopram  10 mg Oral Daily  . darbepoetin (ARANESP) injection - DIALYSIS  60 mcg Intravenous Q Thu-HD  . feeding supplement (NEPRO CARB STEADY)  237 mL Oral TID BM  . feeding supplement (PRO-STAT SUGAR FREE 64)  30 mL Oral BID  . BioGaia Probiotic  5 drop Oral Q2000  . latanoprost  1 drop Both Eyes QHS  . midodrine      . midodrine  10 mg Oral TID  . multivitamin  1 tablet Oral QHS  . predniSONE  50 mg Oral Q breakfast  . rosuvastatin  20 mg  Oral QHS   Continuous Infusions:  PRN Meds:.acetaminophen **OR** [DISCONTINUED] acetaminophen, albuterol, nystatin, sodium chloride flush   PHYSICAL EXAM: Vital signs: Vitals:   03/15/20 0800 03/15/20 0830 03/15/20 0900 03/15/20 0930  BP: (!) 119/45 (!) 115/58 (!) 136/49 125/67  Pulse: (!) 56 85 (!) 57 73  Resp:      Temp:      TempSrc:      SpO2:      Weight:      Height:       Filed Weights   03/12/20 1110 03/13/20 2048 03/15/20 0732  Weight: 62.7 kg 66.2 kg 74.8 kg   Body mass index is 24.37 kg/m.   EXAM  Awake Alert, No new F.N deficits, Normal affect [place].AT,PERRAL Supple Neck,No JVD, No cervical lymphadenopathy appriciated.  Symmetrical Chest wall movement, Good air movement bilaterally, CTAB RRR,No Gallops, Rubs or new Murmurs, No Parasternal Heave +ve B.Sounds, Abd Soft, No tenderness, No organomegaly appriciated, No rebound - guarding or rigidity. No Cyanosis, Clubbing or edema, No new  Rash or bruise    I have personally reviewed following labs and imaging studies  LABORATORY DATA: CBC: Recent Labs  Lab 03/10/20 1204 03/11/20 0500 03/12/20 0815 03/14/20 0811 03/15/20 0800  WBC 11.3* 10.9* 9.7 16.0* 15.7*  NEUTROABS  --   --   --   --  12.4*  HGB 9.9* 9.4* 9.6* 11.6* 9.2*  HCT 30.7* 29.3* 30.3* 37.7* 30.0*  MCV 96.2 96.1 98.1 101.3* 101.7*  PLT 281 265 280 280 638    Basic Metabolic Panel: Recent Labs  Lab 03/09/20 0403 03/09/20 0403 03/10/20 1204 03/10/20 1204 03/11/20 0500 03/12/20 0814 03/13/20 0442 03/14/20 0811 03/15/20 0800  NA 134*   < > 135   < > 131* 133* 137 136 135  K 3.9   < > 4.4   < > 4.2 3.3* 3.8 4.1 4.0  CL 92*   < > 93*   < > 88* 97* 96* 95* 95*  CO2 28   < > 28   < > 28 28 28 25 30   GLUCOSE 101*   < > 95   < > 90 99 100* 88 101*  BUN 17   < > 31*   < > 34* 11 16 34* 49*  CREATININE 4.57*   < > 6.63*   < > 7.38* 4.34* 3.17* 4.92* 6.85*  CALCIUM 9.1   < > 9.9   < > 9.5 8.7* 9.5 10.5* 9.8  MG 2.0  --   --   --   --    --   --  2.4 2.6*  PHOS  --   --  2.8  --  2.9 1.9* 2.2* 2.5  --    < > = values in this interval not displayed.    GFR: Estimated Creatinine Clearance: 7.5 mL/min (A) (by C-G formula based on SCr of 6.85 mg/dL (H)).  Liver Function Tests: Recent Labs  Lab 03/09/20 0403 03/09/20 0403 03/10/20 1204 03/11/20 0500 03/12/20 0814 03/13/20 1715 03/14/20 0811 03/15/20 0800  AST 26  --   --   --   --   --   --  47*  ALT 13  --   --   --   --   --   --  28  ALKPHOS 54  --   --   --   --   --   --  68  BILITOT 1.4*  --   --   --   --   --   --  0.7  PROT 5.8*  --   --   --   --  6.3* 7.2 5.9*  ALBUMIN 3.0*   < > 3.4* 3.1* 2.9*  --  3.5 3.0*   < > = values in this interval not displayed.   No results for input(s): LIPASE, AMYLASE in the last 168 hours. No results for input(s): AMMONIA in the last 168 hours.  Coagulation Profile: No results for input(s): INR, PROTIME in the last 168 hours.  Cardiac Enzymes: No results for input(s): CKTOTAL, CKMB, CKMBINDEX, TROPONINI in the last 168 hours.  BNP (last 3 results) No results for input(s): PROBNP in the last 8760 hours.  Lipid Profile: No results for input(s): CHOL, HDL, LDLCALC, TRIG, CHOLHDL, LDLDIRECT in the last 72 hours.  Thyroid Function Tests: Recent Labs    03/14/20 0811  TSH 1.415    Anemia Panel: No results for input(s): VITAMINB12, FOLATE, FERRITIN, TIBC, IRON, RETICCTPCT in the last 72 hours.  Urine analysis:  Component Value Date/Time   COLORURINE YELLOW 03/07/2020 1128   APPEARANCEUR HAZY (A) 03/07/2020 1128   LABSPEC 1.008 03/07/2020 1128   PHURINE 9.0 (H) 03/07/2020 1128   GLUCOSEU NEGATIVE 03/07/2020 1128   GLUCOSEU NEGATIVE 01/30/2020 1518   HGBUR SMALL (A) 03/07/2020 1128   BILIRUBINUR NEGATIVE 03/07/2020 1128   KETONESUR NEGATIVE 03/07/2020 1128   PROTEINUR 100 (A) 03/07/2020 1128   UROBILINOGEN 0.2 01/30/2020 1518   NITRITE NEGATIVE 03/07/2020 1128   LEUKOCYTESUR LARGE (A) 03/07/2020 1128     Sepsis Labs: Lactic Acid, Venous    Component Value Date/Time   LATICACIDVEN 0.66 12/08/2016 0605    MICROBIOLOGY: Recent Results (from the past 240 hour(s))  Urine culture     Status: None   Collection Time: 03/06/20  1:11 PM   Specimen: Urine, Random  Result Value Ref Range Status   Specimen Description URINE, RANDOM  Final   Special Requests NONE  Final   Culture   Final    NO GROWTH Performed at [facility], [place] [address] [place], [place] [address]    Report Status 03/08/2020 FINAL  Final  Blood culture (routine x 2)     Status: None   Collection Time: 03/06/20  2:37 PM   Specimen: BLOOD RIGHT FOREARM  Result Value Ref Range Status   Specimen Description BLOOD RIGHT FOREARM  Final   Special Requests   Final    BOTTLES DRAWN AEROBIC AND ANAEROBIC Blood Culture results may not be optimal due to an inadequate volume of blood received in culture bottles   Culture   Final    NO GROWTH 5 DAYS Performed at [facility], [place] [address], [place], [place] [address]    Report Status 03/11/2020 FINAL  Final  Blood culture (routine x 2)     Status: None   Collection Time: 03/06/20  3:15 PM   Specimen: BLOOD  Result Value Ref Range Status   Specimen Description BLOOD RIGHT ANTECUBITAL  Final   Special Requests   Final    BOTTLES DRAWN AEROBIC AND ANAEROBIC Blood Culture results may not be optimal due to an inadequate volume of blood received in culture bottles   Culture   Final    NO GROWTH 5 DAYS Performed at [facility], [place] [address] [place], [place] [address]    Report Status 03/11/2020 FINAL  Final  Respiratory Panel by RT PCR (Flu A&B, Covid) - Nasopharyngeal Swab     Status: None   Collection Time: 03/06/20 11:15 PM   Specimen: Nasopharyngeal Swab  Result Value Ref Range Status   SARS Coronavirus 2 by RT PCR NEGATIVE NEGATIVE Final    Comment: (NOTE) SARS-CoV-2 target nucleic acids are NOT DETECTED. The SARS-CoV-2 RNA is generally  detectable in upper respiratoy specimens during the acute phase of infection. The lowest concentration of SARS-CoV-2 viral copies this assay can detect is 131 copies/mL. A negative result does not preclude SARS-Cov-2 infection and should not be used as the sole basis for treatment or other patient management decisions. A negative result may occur with  improper specimen collection/handling, submission of specimen other than nasopharyngeal swab, presence of viral mutation(s) within the areas targeted by this assay, and inadequate number of viral copies (<131 copies/mL). A negative result must be combined with clinical observations, patient history, and epidemiological information. The expected result is Negative. Fact Sheet for Patients:  PinkCheek.be Fact Sheet for Healthcare Providers:  GravelBags.it This test is not yet ap proved or  cleared by the [place] and  has been authorized for detection and/or diagnosis of SARS-CoV-2 by FDA under an Emergency Use Authorization (EUA). This EUA will remain  in effect (meaning this test can be used) for the duration of the COVID-19 declaration under Section 564(b)(1) of the Act, 21 U.S.C. section 360bbb-3(b)(1), unless the authorization is terminated or revoked sooner.    Influenza A by PCR NEGATIVE NEGATIVE Final   Influenza B by PCR NEGATIVE NEGATIVE Final    Comment: (NOTE) The Xpert Xpress SARS-CoV-2/FLU/RSV assay is intended as an aid in  the diagnosis of influenza from Nasopharyngeal swab specimens and  should not be used as a sole basis for treatment. Nasal washings and  aspirates are unacceptable for Xpert Xpress SARS-CoV-2/FLU/RSV  testing. Fact Sheet for Patients: PinkCheek.be Fact Sheet for Healthcare Providers: GravelBags.it This test is not yet approved or cleared by the [place] FDA and  has been  authorized for detection and/or diagnosis of SARS-CoV-2 by  FDA under an Emergency Use Authorization (EUA). This EUA will remain  in effect (meaning this test can be used) for the duration of the  Covid-19 declaration under Section 564(b)(1) of the Act, 21  U.S.C. section 360bbb-3(b)(1), unless the authorization is  terminated or revoked. Performed at [facility], [place] [address], [place], [place] [address]   SARS CORONAVIRUS 2 (TAT 6-24 HRS) Nasopharyngeal Nasopharyngeal Swab     Status: None   Collection Time: 03/10/20  1:20 PM   Specimen: Nasopharyngeal Swab  Result Value Ref Range Status   SARS Coronavirus 2 NEGATIVE NEGATIVE Final    Comment: (NOTE) SARS-CoV-2 target nucleic acids are NOT DETECTED. The SARS-CoV-2 RNA is generally detectable in upper and lower respiratory specimens during the acute phase of infection. Negative results do not preclude SARS-CoV-2 infection, do not rule out co-infections with other pathogens, and should not be used as the sole basis for treatment or other patient management decisions. Negative results must be combined with clinical observations, patient history, and epidemiological information. The expected result is Negative. Fact Sheet for Patients: SugarRoll.be Fact Sheet for Healthcare Providers: https://www.woods-mathews.com/ This test is not yet approved or cleared by the [place] FDA and  has been authorized for detection and/or diagnosis of SARS-CoV-2 by FDA under an Emergency Use Authorization (EUA). This EUA will remain  in effect (meaning this test can be used) for the duration of the COVID-19 declaration under Section 56 4(b)(1) of the Act, 21 U.S.C. section 360bbb-3(b)(1), unless the authorization is terminated or revoked sooner. Performed at [facility], [place] [address], [place], [place] [address]   MRSA PCR Screening     Status: None   Collection Time: 03/11/20  3:06 AM    Specimen: Nasal Mucosa; Nasopharyngeal  Result Value Ref Range Status   MRSA by PCR NEGATIVE NEGATIVE Final    Comment:        The GeneXpert MRSA Assay (FDA approved for NASAL specimens only), is one component of a comprehensive MRSA colonization surveillance program. It is not intended to diagnose MRSA infection nor to guide or monitor treatment for MRSA infections. Performed at [facility], [place] [address], [place], [place] [address]   Body fluid culture     Status: None (Preliminary result)   Collection Time: 03/13/20  4:44 PM   Specimen: Pleura; Body Fluid  Result Value Ref Range Status   Specimen Description PLEURAL FLUID RIGHT  Final   Special Requests Normal  Final  Gram Stain   Final    CYTOSPIN SMEAR WBC PRESENT, PREDOMINANTLY MONONUCLEAR NO ORGANISMS SEEN    Culture   Final    NO GROWTH 2 DAYS Performed at [facility] [address] [place], [place] [address]    Report Status PENDING  Incomplete    RADIOLOGY STUDIES/RESULTS: DG Chest Port 1 View  Result Date: 03/15/2020 CLINICAL DATA:  86 year old male with history of shortness of breath. History of end-stage renal disease. EXAM: PORTABLE CHEST 1 VIEW COMPARISON:  Chest x-ray 03/13/2020. FINDINGS: Right internal jugular PermCath with tip terminating in the right atrium. Lung volumes are slightly low. Opacity at the left base obscuring the left hemidiaphragm which may reflect atelectasis and/or consolidation. No definite pleural effusions. No evidence of pulmonary edema. Mild cardiomegaly. Upper mediastinal contours are within normal limits. Aortic atherosclerosis. IMPRESSION: 1. Support apparatus, as above. 2. Atelectasis and/or consolidation in the left lower lobe. 3. Aortic atherosclerosis. Electronically Signed   By: Vinnie Langton M.D.   On: 03/15/2020 08:13   DG Chest Port 1 View  Result Date: 03/13/2020 CLINICAL DATA:  Status post thoracentesis EXAM: PORTABLE CHEST 1 VIEW COMPARISON:  Mar 10, 2020 FINDINGS: There is a well-positioned tunneled dialysis catheter. The right-sided pleural effusion has decreased in size since the prior study. There is no evidence for a right-sided pneumothorax. There may be a small left-sided pleural effusion. There is bibasilar atelectasis. The heart size remains enlarged. A vascular stent again projects over the left lung apex. Atherosclerotic changes are noted of the thoracic aorta. IMPRESSION: 1. No pneumothorax status post thoracentesis. 2. Bibasilar atelectasis. 3. Stable cardiomegaly. Electronically Signed   By: Constance Holster M.D.   On: 03/13/2020 18:19     LOS: 4 days   Signature  Lala Lund M.D on 03/15/2020 at 10:02 AM   -  To page go to [email or web address]

## 2020-03-15 NOTE — Progress Notes (Addendum)
[facility] Progress Note   Assessment/ Plan:   Dialysis Orders: [facility]- MWF 4 hours, 400 BFR via TDC EDW 66- 2 K, 2 calc bath. No heparin  Calcitriol 1.5 mcg and sensipar 30 q HD, mircera 30  Assessment/Plan: 1.Confusion- waxing and waning, baseline dementia, hypoglycemic on arrival, +UTI, recent SDH. Rocephin given.  2.Hypoglycemia- per primary--> possible insulinoma, CT scan planned per primary 3. ESRD-on HD MWF. No Heparin. Next HD today 5/10.  4. Anemiaof CKD-Hgb 11.6. ESA dosed on 5/3 5. Secondary hyperparathyroidism- Ca a little elevated, decrease VDRA.Phosimproved to 2.5 s/p IV repletion.Hold binders.Continue sensipar 6.HTN/volume- Blood pressure mostly wellcontrolled.Does not appear volume overloaded. Titrate down volume as tolerated. Request standing or hoyer weight to better assess edw, difference ofalmost 20kg recorded. UF removed yesterday2L.If weights correct today, under EDW, likely need to lower at d/c. 7. Nutrition- Renal diet w/fluid restrictions.  Subjective:    Seen on HD.  Sleepy but arousable to voice.  CT scan planned--> possible insulinoma   Objective:   BP (!) 136/49   Pulse (!) 57   Temp 97.7 F (36.5 C) (Axillary)   Resp 18   Ht 5\' 9"  (1.753 m)   Wt 74.8 kg   SpO2 99%   BMI 24.37 kg/m   Physical Exam: Gen: NAD, sleeping, lying in bed CVS: RRR no m/r/g Resp: clear anteriorly Abd: soft, nontender Ext: no LE edema ACCESS: R IJ TDC  Labs: BMET Recent Labs  Lab 03/08/20 1303 03/09/20 0403 03/10/20 1204 03/11/20 0500 03/12/20 0814 03/13/20 0442 03/14/20 0811  NA  --  134* 135 131* 133* 137 136  K  --  3.9 4.4 4.2 3.3* 3.8 4.1  CL  --  92* 93* 88* 97* 96* 95*  CO2  --  28 28 28 28 28 25   GLUCOSE 134* 101* 95 90 99 100* 88  BUN  --  17 31* 34* 11 16 34*  CREATININE  --  4.57* 6.63* 7.38* 4.34* 3.17* 4.92*  CALCIUM  --  9.1 9.9 9.5 8.7* 9.5 10.5*  PHOS  --   --  2.8 2.9 1.9* 2.2* 2.5    CBC Recent Labs  Lab 03/11/20 0500 03/12/20 0815 03/14/20 0811 03/15/20 0800  WBC 10.9* 9.7 16.0* 15.7*  NEUTROABS  --   --   --  12.4*  HGB 9.4* 9.6* 11.6* 9.2*  HCT 29.3* 30.3* 37.7* 30.0*  MCV 96.1 98.1 101.3* 101.7*  PLT 265 280 280 292      Medications:    . allopurinol  100 mg Oral Daily  . aspirin EC  81 mg Oral Daily  . calcitRIOL  1.5 mcg Oral Q M,W,F-HD  . Chlorhexidine Gluconate Cloth  6 each Topical Q0600  . Chlorhexidine Gluconate Cloth  6 each Topical Q0600  . cinacalcet  30 mg Oral Q M,W,F-HD  . citalopram  10 mg Oral Daily  . darbepoetin (ARANESP) injection - DIALYSIS  60 mcg Intravenous Q Thu-HD  . feeding supplement (NEPRO CARB STEADY)  237 mL Oral TID BM  . feeding supplement (PRO-STAT SUGAR FREE 64)  30 mL Oral BID  . BioGaia Probiotic  5 drop Oral Q2000  . latanoprost  1 drop Both Eyes QHS  . midodrine      . midodrine  10 mg Oral TID  . multivitamin  1 tablet Oral QHS  . predniSONE  50 mg Oral Q breakfast  . rosuvastatin  20 mg Oral QHS     Madelon Lips MD 03/15/2020, 9:08  AM  

## 2020-03-15 NOTE — TOC Progression Note (Signed)
Transition of Care (TOC) - Progression Note    Patient Details  Name: [name] Sr. MRN: [id number] Date of Birth: 11/20/1928  Transition of Care [facility]) CM/SW [place], LCSW Phone Number: 03/15/2020, 2:56 PM  Clinical Narrative:    CSW provided SNF bed offers to patient's son (texted as requested). He requested that medical team be certain patient is medically optimized prior to discharging to SNF rehab facility. He will research SNFs and let CSW know which is preference. CSW will then confirm bed availability and dialysis transportation.    Expected Discharge Plan: [place] Barriers to Discharge: Continued Medical Work up  Expected Discharge Plan and Services Expected Discharge Plan: [place] In-house Referral: Clinical Social Work   Post Acute Care Choice: [place] Living arrangements for the past 2 months: Single Family Home                                       Social Determinants of Health (SDOH) Interventions    Readmission Risk Interventions No flowsheet data found.

## 2020-03-15 NOTE — Plan of Care (Signed)
  Problem: Safety: Goal: Ability to remain free from injury will improve Outcome: Progressing   

## 2020-03-15 NOTE — Progress Notes (Signed)
SLP Cancellation Note  Patient Details Name: Gabriel Dovidio Youngers Sr. MRN: [id number] DOB: 19-Jun-1929   Cancelled treatment:       Reason Eval/Treat Not Completed: Patient at procedure or test/unavailable. Pt in HD   Florina Glas, Katherene Ponto 03/15/2020, 8:43 AM

## 2020-03-15 NOTE — Progress Notes (Signed)
Calorie Count Note  48 hour calorie count ordered.  Diet: dysphagia 1 with thin liquids Supplements: Nepro Shake po TID, each supplement provides 425 kcal and 19 grams protein; 30 ml Prostat BID, each supplement provides 100 kcals and 15 grams protein; renal MVI daily  Pt out of room at time of visit. No meal completion documented for breakfast this AM.   03/14/20 Dinner: 631 kcals, 34 grams protein Supplements: 1 Nepro supplements (425 kcals, 19 grams protein); 1 Prostat supplement (100 kcals, 15 grams protein)  Total intake: 1156 kcal (59% of minimum estimated needs)  68 grams protein (68% of minimum estimated needs)  Nutrition Dx: Increased nutrient needs related to chronic illness(ESRD on HD) as evidenced by estimated needs; ongoing  Goal: Patient will meet greater than or equal to 90% of their needs; progressing   Intervention:   -Continue 48 hour calorie count -Continue Nepro Shake po TID, each supplement provides 425 kcal and 19 grams protein -Continue 30 ml Pro-stat BID, each supplement provides 100 kcal and 15 grams protein  -Continue renal MVI daily  Loistine Chance, RD, LDN, [place] Registered Dietitian II Certified Diabetes Care and Education Specialist Please refer to [facility] for RD and/or RD on-call/weekend/after hours pager

## 2020-03-15 NOTE — Procedures (Signed)
Patient seen and examined on Hemodialysis. BP (!) 136/49   Pulse (!) 57   Temp 97.7 F (36.5 C) (Axillary)   Resp 18   Ht 5\' 9"  (1.753 m)   Wt 74.8 kg   SpO2 99%   BMI 24.37 kg/m   QB 400 mL/ min via TDC, UF goal 3L  Tolerating treatment without complaints at this time.   Madelon Lips MD [facility] pgr [phone] 9:11 AM

## 2020-03-16 DIAGNOSIS — J90 Pleural effusion, not elsewhere classified: Secondary | ICD-10-CM

## 2020-03-16 LAB — COMPREHENSIVE METABOLIC PANEL
ALT: 70 U/L — ABNORMAL HIGH (ref 0–44)
AST: 106 U/L — ABNORMAL HIGH (ref 15–41)
Albumin: 3.5 g/dL (ref 3.5–5.0)
Alkaline Phosphatase: 85 U/L (ref 38–126)
Anion gap: 14 (ref 5–15)
BUN: 29 mg/dL — ABNORMAL HIGH (ref 8–23)
CO2: 26 mmol/L (ref 22–32)
Calcium: 9.3 mg/dL (ref 8.9–10.3)
Chloride: 96 mmol/L — ABNORMAL LOW (ref 98–111)
Creatinine, Ser: 4 mg/dL — ABNORMAL HIGH (ref 0.61–1.24)
GFR calc Af Amer: 15 mL/min — ABNORMAL LOW (ref >60)
GFR calc non Af Amer: 13 mL/min — ABNORMAL LOW (ref >60)
Glucose, Bld: 105 mg/dL — ABNORMAL HIGH (ref 70–99)
Potassium: 4.3 mmol/L (ref 3.5–5.1)
Sodium: 136 mmol/L (ref 135–145)
Total Bilirubin: 0.8 mg/dL (ref 0.3–1.2)
Total Protein: 6.9 g/dL (ref 6.5–8.1)

## 2020-03-16 LAB — MAGNESIUM: Magnesium: 2.2 mg/dL (ref 1.7–2.4)

## 2020-03-16 LAB — CBC WITH DIFFERENTIAL/PLATELET
Abs Immature Granulocytes: 0.26 10*3/uL — ABNORMAL HIGH (ref 0.00–0.07)
Basophils Absolute: 0 10*3/uL (ref 0.0–0.1)
Basophils Relative: 0 %
Eosinophils Absolute: 0 10*3/uL (ref 0.0–0.5)
Eosinophils Relative: 0 %
HCT: 35.3 % — ABNORMAL LOW (ref 39.0–52.0)
Hemoglobin: 11 g/dL — ABNORMAL LOW (ref 13.0–17.0)
Immature Granulocytes: 2 %
Lymphocytes Relative: 5 %
Lymphs Abs: 0.7 10*3/uL (ref 0.7–4.0)
MCH: 31.7 pg (ref 26.0–34.0)
MCHC: 31.2 g/dL (ref 30.0–36.0)
MCV: 101.7 fL — ABNORMAL HIGH (ref 80.0–100.0)
Monocytes Absolute: 0.9 10*3/uL (ref 0.1–1.0)
Monocytes Relative: 6 %
Neutro Abs: 12.7 10*3/uL — ABNORMAL HIGH (ref 1.7–7.7)
Neutrophils Relative %: 87 %
Platelets: 308 10*3/uL (ref 150–400)
RBC: 3.47 MIL/uL — ABNORMAL LOW (ref 4.22–5.81)
RDW: 19.8 % — ABNORMAL HIGH (ref 11.5–15.5)
WBC: 14.5 10*3/uL — ABNORMAL HIGH (ref 4.0–10.5)
nRBC: 0.2 % (ref 0.0–0.2)

## 2020-03-16 LAB — GLUCOSE, CAPILLARY
Glucose-Capillary: 120 mg/dL — ABNORMAL HIGH (ref 70–99)
Glucose-Capillary: 128 mg/dL — ABNORMAL HIGH (ref 70–99)
Glucose-Capillary: 70 mg/dL (ref 70–99)
Glucose-Capillary: 78 mg/dL (ref 70–99)
Glucose-Capillary: 90 mg/dL (ref 70–99)
Glucose-Capillary: 90 mg/dL (ref 70–99)

## 2020-03-16 LAB — PROCALCITONIN: Procalcitonin: 1.37 ng/mL

## 2020-03-16 LAB — BODY FLUID CULTURE
Culture: NO GROWTH
Special Requests: NORMAL

## 2020-03-16 LAB — ACTH: C206 ACTH: 31.7 pg/mL (ref 7.2–63.3)

## 2020-03-16 LAB — BRAIN NATRIURETIC PEPTIDE: B Natriuretic Peptide: 1500.8 pg/mL — ABNORMAL HIGH (ref 0.0–100.0)

## 2020-03-16 MED ORDER — NEPRO/CARBSTEADY PO LIQD
237.0000 mL | Freq: Every day | ORAL | Status: DC
  Administered 2020-03-17 – 2020-03-21 (×5): 237 mL via ORAL

## 2020-03-16 MED ORDER — CHLORHEXIDINE GLUCONATE CLOTH 2 % EX PADS
6.0000 | MEDICATED_PAD | Freq: Every day | CUTANEOUS | Status: DC
  Administered 2020-03-17: 6 via TOPICAL

## 2020-03-16 NOTE — Progress Notes (Signed)
[facility] Progress Note   Subjective:   Patient seen and examined at bedside in room.  No complaints.  Denies CP, SOB, n/v/d, and abdominal pain.  Objective Vitals:   03/15/20 1145 03/15/20 2039 03/16/20 0403 03/16/20 0900  BP: 123/60 130/79 (!) 154/72 (!) 148/75  Pulse: 81 84 79 81  Resp: 17 20 18 17   Temp: 98 F (36.7 C) 98.8 F (37.1 C) 99.3 F (37.4 C)   TempSrc: Axillary Oral Oral   SpO2: 96% 96% 96% 95%  Weight: 70.5 kg 68.9 kg    Height:       Physical Exam General:chronically ill appearing, frail, elderly, pleasantly demented male in NAD Heart:RRR Lungs:CTAB anteriorly  Abdomen:soft, NTND Extremities:no LE edema Dialysis Access: [facility]   Filed Weights   03/15/20 0732 03/15/20 1145 03/15/20 2039  Weight: 74.8 kg 70.5 kg 68.9 kg    Intake/Output Summary (Last 24 hours) at 03/16/2020 1105 Last data filed at 03/16/2020 0525 Gross per 24 hour  Intake 760 ml  Output 3000 ml  Net -2240 ml    Additional Objective Labs: Basic Metabolic Panel: Recent Labs  Lab 03/12/20 0814 03/12/20 0814 03/13/20 0442 03/13/20 0442 03/14/20 0811 03/15/20 0800 03/16/20 0549  NA 133*   < > 137   < > 136 135 136  K 3.3*   < > 3.8   < > 4.1 4.0 4.3  CL 97*   < > 96*   < > 95* 95* 96*  CO2 28   < > 28   < > 25 30 26   GLUCOSE 99   < > 100*   < > 88 101* 105*  BUN 11   < > 16   < > 34* 49* 29*  CREATININE 4.34*   < > 3.17*   < > 4.92* 6.85* 4.00*  CALCIUM 8.7*   < > 9.5   < > 10.5* 9.8 9.3  PHOS 1.9*  --  2.2*  --  2.5  --   --    < > = values in this interval not displayed.   Liver Function Tests: Recent Labs  Lab 03/14/20 0811 03/15/20 0800 03/16/20 0549  AST  --  47* 106*  ALT  --  28 70*  ALKPHOS  --  68 85  BILITOT  --  0.7 0.8  PROT 7.2 5.9* 6.9  ALBUMIN 3.5 3.0* 3.5   CBC: Recent Labs  Lab 03/11/20 0500 03/11/20 0500 03/12/20 0815 03/12/20 0815 03/14/20 0811 03/15/20 0800 03/16/20 0549  WBC 10.9*   < > 9.7   < > 16.0* 15.7* 14.5*   NEUTROABS  --   --   --   --   --  12.4* 12.7*  HGB 9.4*   < > 9.6*   < > 11.6* 9.2* 11.0*  HCT 29.3*   < > 30.3*   < > 37.7* 30.0* 35.3*  MCV 96.1  --  98.1  --  101.3* 101.7* 101.7*  PLT 265   < > 280   < > 280 292 308   < > = values in this interval not displayed.   CBG: Recent Labs  Lab 03/15/20 1644 03/15/20 2040 03/16/20 0000 03/16/20 0404 03/16/20 0719  GLUCAP 91 121* 120* 90 70   Studies/Results: CT ABDOMEN PELVIS W WO CONTRAST  Result Date: 03/15/2020 CLINICAL DATA:  Question insulinoma, end-stage renal disease. EXAM: CT ABDOMEN AND PELVIS WITHOUT AND WITH CONTRAST TECHNIQUE: Multidetector CT imaging of the abdomen and pelvis was performed  following the standard protocol before and following the bolus administration of intravenous contrast. CONTRAST:  164mL OMNIPAQUE IOHEXOL 300 MG/ML  SOLN COMPARISON:  03/06/2020 FINDINGS: Lower chest: Small effusions, diminished on the RIGHT when compared to previous study from 03/06/2020. Basilar atelectasis. Mitral annular calcification. Heart size enlarged. Hepatobiliary: No focal hepatic lesion aside from a large hepatic cyst arising from the posterior RIGHT hepatic lobe. The portal vein is patent. No gross biliary ductal dilation. Pancreas: Assessment of the pancreas is limited by respiratory motion. No focal, enhancing hepatic lesion or sign of ductal dilation within the limitations of the exam. Spleen: Spleen is normal size without focal lesion. Adrenals/Urinary Tract: Low-density LEFT adrenal lesion could potentially even represent a LEFT adrenal cyst evaluation hampered by motion more likely a LEFT adrenal adenoma with variable fat density measuring 2 x 1.7 cm within 1 mm of previous imaging. RIGHT adrenal is normal. Low-density renal lesions compatible with cysts bilaterally more on the LEFT than the RIGHT. No hydronephrosis. No nephrolithiasis. Stomach/Bowel: Moderate to marked gastric distension. No acute small bowel process. The appendix  with small appendicular lith otherwise unremarkable. Colon is full of liquid stool and gas without abrupt transition. Scattered colonic diverticulosis. Vascular/Lymphatic: Calcified and noncalcified atheromatous plaque in the abdominal aorta with tortuous iliac vessels. Mild dilation of the LEFT common iliac artery at 1.5 cm. No retroperitoneal or upper abdominal lymphadenopathy. Reproductive: Post hysterectomy. Other: No signs of abdominal wall hernia or abdominal fluid collection. Musculoskeletal: Fluid collection over LEFT hip compatible with evolving hematoma / morell lavellee lesion. Signs of LEFT hip arthroplasty. Spinal degenerative changes. IMPRESSION: 1. No visible pancreatic lesion. Study limited by some motion artifact. 2. LEFT adrenal adenoma, evaluation hampered by motion. Similar to prior studies. 3. Small bilateral pleural effusions, diminished on the RIGHT when compared to previous study. Basilar atelectasis. 4. Moderate to marked gastric distension, new from the prior exam. 5. Fluid collection over LEFT hip compatible with evolving hematoma / morell lavellee lesion. 6. Bilateral renal cysts. 7. Hepatic cyst as before. 8. Aortic atherosclerosis. Aortic Atherosclerosis (ICD10-I70.0). Electronically Signed   By: Zetta Bills M.D.   On: 03/15/2020 19:28   DG Chest Port 1 View  Result Date: 03/15/2020 CLINICAL DATA:  86 year old male with history of shortness of breath. History of end-stage renal disease. EXAM: PORTABLE CHEST 1 VIEW COMPARISON:  Chest x-ray 03/13/2020. FINDINGS: Right internal jugular PermCath with tip terminating in the right atrium. Lung volumes are slightly low. Opacity at the left base obscuring the left hemidiaphragm which may reflect atelectasis and/or consolidation. No definite pleural effusions. No evidence of pulmonary edema. Mild cardiomegaly. Upper mediastinal contours are within normal limits. Aortic atherosclerosis. IMPRESSION: 1. Support apparatus, as above. 2.  Atelectasis and/or consolidation in the left lower lobe. 3. Aortic atherosclerosis. Electronically Signed   By: Vinnie Langton M.D.   On: 03/15/2020 08:13    Medications:  . allopurinol  100 mg Oral Daily  . aspirin EC  81 mg Oral Daily  . calcitRIOL  1 mcg Oral Q M,W,F-HD  . Chlorhexidine Gluconate Cloth  6 each Topical Q0600  . Chlorhexidine Gluconate Cloth  6 each Topical Q0600  . cinacalcet  30 mg Oral Q M,W,F-HD  . citalopram  10 mg Oral Daily  . darbepoetin (ARANESP) injection - DIALYSIS  60 mcg Intravenous Q Thu-HD  . feeding supplement (NEPRO CARB STEADY)  237 mL Oral TID BM  . feeding supplement (PRO-STAT SUGAR FREE 64)  30 mL Oral BID  . BioGaia Probiotic  5 drop Oral Q2000  . latanoprost  1 drop Both Eyes QHS  . midodrine  10 mg Oral TID  . multivitamin  1 tablet Oral QHS  . predniSONE  50 mg Oral Q breakfast  . rosuvastatin  20 mg Oral QHS    Dialysis Orders: [facility]- MWF 4 hours, 400 BFR via [facility]DC EDW 66- 2 K, 2 calc bath. No heparin  Calcitriol 1.5 mcg and sensipar 30 q HD, mircera 30  Assessment/Plan: 1.Confusion- waxing and waning, baseline dementia, hypoglycemic on arrival, +UTI, recent SDH. Rocephin given.  2.Hypoglycemia- per primary--> CT abd 5/10 with no obvious pancreatic lesion.  On PO prednisone and to f/u as OP w/Endocrine.  3. Bilateral pleural effusions - s/p thoracentesis.  CT abd showed no reoccurrence.  4. ESRD-on HD MWF. No Heparin. Orders written for HD tomorrow per regular schedule. K 4.3 5. Anemiaof CKD-Hgb 11.0. ESA dosed on 5/3 6. Secondary hyperparathyroidism- Caa little elevated, decrease VDRA.Phosimproved to 2.5s/p IV repletion.Hold binders.Continue sensipar 7.HTN/volume- Blood pressureslightly elevated this AM.Does not appear volume overloaded. If weights correct remains 2L over dry post HD yesterday netUF 3L removed.Titrate down volume as tolerated, try to get closer to dry.  Requesting hoyer weight to better  assess edw.  8. Nutrition- Renal diet w/fluid restrictions.  Jen Mow, PA-C [facility] Pager: [phone] 03/16/2020,11:05 AM  LOS: 5 days

## 2020-03-16 NOTE — Plan of Care (Signed)
  Problem: Safety: Goal: Ability to remain free from injury will improve Outcome: Progressing   

## 2020-03-16 NOTE — Progress Notes (Addendum)
  Speech Language Pathology Treatment: Dysphagia  Patient Details Name: Gabriel Silvera Vallery Sr. MRN: [id number] DOB: 13-Apr-1929 Today's Date: 03/16/2020 Time: 0973-5329 SLP Time Calculation (min) (ACUTE ONLY): 22 min  Assessment / Plan / Recommendation Clinical Impression  Pt seen during lunch with pureed solids and thin liquids via cup. Upper dentures noted to be ill-fitting. RN reports pt is unable to consistently demonstrate the ability to use a straw. Trials of Dys2 solids were provided, to assess appropriateness for advanced solids. Pt exhibits adequate oral clearing of saltine cracker, however, pt tends to pocket solids and demonstrate extended, poorly organized mastication of solid textures per RN. Pt exhibited intermittent cough during this meal, so will continue to follow to assess diet tolerance.   Recommend continuing with puree diet and thin liquids, as pt has demonstrated good tolerance and adequate intake of these consistencies. Pt may have dys 2 items intermittently (soft fruit, etc) with full supervision. Continue crushed meds. Recommend use of adhesive to maximize properly fitting dentures.   HPI HPI: Gabriel Mattie Watson Sr. is a 86 y.o. male with medical history significant of ESRD, stroke, prostate cancer, anemia of chronic disease, HLD, orthostatic hypotension, depression, and gout presented to the hospital with confusion. Per chart recent hospitalization 5/1-5/4 after presenting with multiple falls found to have a right sided subdural hematoma without midline shift or mass-effect. Found to be hypoglycemic, ongoing UTI. CT scan and redemonstrated a right subdural hematoma which appeared to be minimally decreased in size. CXR Mild bibasilar opacities are noted concerning for atelectasis or edema with associated pleural effusions. BSE 03/08/20; missing lower dentures, no overt s/s aspiration      SLP Plan  Continue with current plan of care       Recommendations  Diet recommendations:  Dysphagia 1 (puree);Thin liquid Liquids provided via: Cup(pt unable to demonstrate ability to use straw consistently) Medication Administration: Crushed with puree Supervision: Patient able to self feed;Staff to assist with self feeding;Full supervision/cueing for compensatory strategies Compensations: Slow rate;Small sips/bites;Minimize environmental distractions;Follow solids with liquid Postural Changes and/or Swallow Maneuvers: Upright 30-60 min after meal;Seated upright 90 degrees                Oral Care Recommendations: Oral care BID Follow up Recommendations: 24 hour supervision/assistance SLP Visit Diagnosis: Dysphagia, unspecified (R13.10) Plan: Continue with current plan of care       GO               Draedyn Weidinger B. Quentin Ore, [facility], [place] Speech Language Pathologist Office: [phone] Pager: [phone]  Shonna Chock 03/16/2020, 12:32 PM

## 2020-03-16 NOTE — Progress Notes (Signed)
PROGRESS NOTE        PATIENT DETAILS Name: Gabriel Raden Laredo Sr. Age: 86 y.o. Sex: male Date of Birth: January 30, 1929 Admit Date: 03/10/2020 Admitting Physician Evalee Mutton Kristeen Mans, MD JSE:GBTD, Hunt Oris, MD  Brief Narrative:  Patient is a 86 y.o. male with history of ESRD on HD MWF, HLD, anemia of chronic disease, orthostatic hypotension, depression/dementia who was found to be confused (more than usual) at HD and sent to the hospital for further evaluation.  See below for further details.  Significant events:  5/5>> readmit to [facility] for evaluation of encephalopathy/hypoglycemia. 5/1-5/4>> admit to [facility]-frequent falls-found to have SDH, left hip hematoma, acute blood loss anemia requiring 2 units of PRBC  Antimicrobial therapy:  Rocephin: 5/5>>5/8  Microbiology data: None  Procedures :  5/8 >>  Right-sided ultrasound-guided thoracentesis by pulmonary critical care, transudative fluid  5/8>> CT chest: Slight enlargement of bilateral pleural effusions, compressive atelectasis bilateral lower lobes 5/8>> CT head: Minimal decrease in SDH  Consults:  Nephrology PCCM  DVT Prophylaxis :  SCD's  Subjective:  Patient in bed, appears comfortable, denies any headache, no fever, no chest pain or pressure, no shortness of breath , no abdominal pain. No focal weakness.   Assessment/Plan:  Recurrent hypoglycemia: Continues to recur-etiology uncertain -  suspect that this is from frailty/poor oral intake/underlying ESRD.  But given recurrent hypoglycemia (was also occurring last admission) - pending  ACTH, C-peptide, proinsulin, sulfonylureas level. Stable A1c along with stable TSH, stable ACTH stimulation test.  C-peptide was quite high  although he was on low-dose D5 drip at that time however it still quite significant, stable CT abdomen pelvis with and without contrast to rules out an obvious pancreatic lesion, discussed with endocrinologist Dr. [name] on  03/15/2020, continue oral prednisone for now with outpatient endocrine follow-up.   Recent Labs    03/16/20 0000 03/16/20 0404 03/16/20 0719  GLUCAP 120* 90 70    Acute metabolic encephalopathy: Does have underlying dementia-but mental status has improved compared to on admission.  Suspect etiology of encephalopathy to be a combination of hypoglycemia, recent SDH/ongoing UTI/infection.  CT head on admission showed decreased size of the SDH.  Continue supportive care.    Complicated UTI/Asp PNA with persistent bilateral pleural effusion (right>>left): Thought to have UTI/asp PNA last admission-and discharged on Augmentin-has been on Rocephin since admission-CT chest done yesterday shows mostly atelectasis-but still shows a pleural effusion.  He has not missed HD-volume status is stable-not sure if this is a parapneumonic effusion/exudative effusion-or a transudate related to hemodialysis etc.    Given his ongoing failure to thrive syndrome/hypoglycemia-and new pleural effusion (not present on imaging studies in 2020) - PCCM was consulted and patient underwent right-sided ultrasound-guided thoracentesis on 03/13/2020, with transudative fluid removed.  Currently off of antibiotics will monitor closely.   ESRD on HD MWF: Nephrology following and directing care  Recent SDH: Following a mechanical fall (last admission)-repeat CT head this admission shows decreased size of SDH.  Recent left hip hematoma: Following a mechanical fall (last admission)-supportive care.  Hemoglobin currently stable-Did require PRBC transfusion last admission.  Anemia: Related to ESRD-hemoglobin stable with no need for transfusion.  Orthostatic hypotension: Continue midodrine-follow.  Depression: Continue Celexa  History of dementia: Stable-encephalopathy improving-suspect mentation not far from usual baseline.  Gout: No flare-continue allopurinol    Diet:  Diet Order  DIET - DYS 1 Room service  appropriate? No; Fluid consistency: Thin  Diet effective now               Code Status: Full code  Family Communication:  Patient's son Jia [phone]) on 03/14/20, 03/15/20   Disposition Plan: Status is: Inpatient  Remains inpatient appropriate because:Altered mental status  Dispo: The patient is from: Home              Anticipated d/c is to: Home              Anticipated d/c date is: As soon as SNF bed available              Patient currently is medically stable to d/c.   Barriers to Discharge: Hypoglycemia/ongoing encephalopathy/recurrent pleural effusion   Antimicrobial agents: Anti-infectives (From admission, onward)   Start     Dose/Rate Route Frequency Ordered Stop   03/10/20 2200  amoxicillin-clavulanate (AUGMENTIN) 500-125 MG per tablet 500 mg  Status:  Discontinued     500 mg Oral Daily at bedtime 03/10/20 1500 03/10/20 1752   03/10/20 1800  cefTRIAXone (ROCEPHIN) 2 g in sodium chloride 0.9 % 100 mL IVPB  Status:  Discontinued     2 g 200 mL/hr over 30 Minutes Intravenous Every 24 hours 03/10/20 1752 03/13/20 1421       Time spent:  25 minutes-Greater than 50% of this time was spent in counseling, explanation of diagnosis, planning of further management, and coordination of care.  MEDICATIONS:  Scheduled Meds:  . allopurinol  100 mg Oral Daily  . aspirin EC  81 mg Oral Daily  . calcitRIOL  1 mcg Oral Q M,W,F-HD  . Chlorhexidine Gluconate Cloth  6 each Topical Q0600  . Chlorhexidine Gluconate Cloth  6 each Topical Q0600  . cinacalcet  30 mg Oral Q M,W,F-HD  . citalopram  10 mg Oral Daily  . darbepoetin (ARANESP) injection - DIALYSIS  60 mcg Intravenous Q Thu-HD  . feeding supplement (NEPRO CARB STEADY)  237 mL Oral TID BM  . feeding supplement (PRO-STAT SUGAR FREE 64)  30 mL Oral BID  . BioGaia Probiotic  5 drop Oral Q2000  . latanoprost  1 drop Both Eyes QHS  . midodrine  10 mg Oral TID  . multivitamin  1 tablet Oral QHS  . predniSONE  50 mg  Oral Q breakfast  . rosuvastatin  20 mg Oral QHS   Continuous Infusions:  PRN Meds:.acetaminophen **OR** [DISCONTINUED] acetaminophen, albuterol, nystatin, sodium chloride flush   PHYSICAL EXAM: Vital signs: Vitals:   03/15/20 1145 03/15/20 2039 03/16/20 0403 03/16/20 0900  BP: 123/60 130/79 (!) 154/72 (!) 148/75  Pulse: 81 84 79 81  Resp: 17 20 18 17   Temp: 98 F (36.7 C) 98.8 F (37.1 C) 99.3 F (37.4 C)   TempSrc: Axillary Oral Oral   SpO2: 96% 96% 96% 95%  Weight: 70.5 kg 68.9 kg    Height:       Filed Weights   03/15/20 0732 03/15/20 1145 03/15/20 2039  Weight: 74.8 kg 70.5 kg 68.9 kg   Body mass index is 22.45 kg/m.   EXAM  Awake , mildly confused, No new F.N deficits,   [place].AT,PERRAL Supple Neck,No JVD, No cervical lymphadenopathy appriciated.  Symmetrical Chest wall movement, Good air movement bilaterally, CTAB RRR,No Gallops, Rubs or new Murmurs, No Parasternal Heave +ve B.Sounds, Abd Soft, No tenderness, No organomegaly appriciated, No rebound - guarding or rigidity. No Cyanosis, Clubbing or edema, No  new Rash or bruise   I have personally reviewed following labs and imaging studies  LABORATORY DATA: CBC: Recent Labs  Lab 03/11/20 0500 03/12/20 0815 03/14/20 0811 03/15/20 0800 03/16/20 0549  WBC 10.9* 9.7 16.0* 15.7* 14.5*  NEUTROABS  --   --   --  12.4* 12.7*  HGB 9.4* 9.6* 11.6* 9.2* 11.0*  HCT 29.3* 30.3* 37.7* 30.0* 35.3*  MCV 96.1 98.1 101.3* 101.7* 101.7*  PLT 265 280 280 292 258    Basic Metabolic Panel: Recent Labs  Lab 03/10/20 1204 03/10/20 1204 03/11/20 0500 03/11/20 0500 03/12/20 0814 03/13/20 0442 03/14/20 0811 03/15/20 0800 03/16/20 0549  NA 135   < > 131*   < > 133* 137 136 135 136  K 4.4   < > 4.2   < > 3.3* 3.8 4.1 4.0 4.3  CL 93*   < > 88*   < > 97* 96* 95* 95* 96*  CO2 28   < > 28   < > 28 28 25 30 26   GLUCOSE 95   < > 90   < > 99 100* 88 101* 105*  BUN 31*   < > 34*   < > 11 16 34* 49* 29*  CREATININE 6.63*    < > 7.38*   < > 4.34* 3.17* 4.92* 6.85* 4.00*  CALCIUM 9.9   < > 9.5   < > 8.7* 9.5 10.5* 9.8 9.3  MG  --   --   --   --   --   --  2.4 2.6* 2.2  PHOS 2.8  --  2.9  --  1.9* 2.2* 2.5  --   --    < > = values in this interval not displayed.    GFR: Estimated Creatinine Clearance: 12.4 mL/min (A) (by C-G formula based on SCr of 4 mg/dL (H)).  Liver Function Tests: Recent Labs  Lab 03/11/20 0500 03/12/20 0814 03/13/20 1715 03/14/20 0811 03/15/20 0800 03/16/20 0549  AST  --   --   --   --  47* 106*  ALT  --   --   --   --  28 70*  ALKPHOS  --   --   --   --  68 85  BILITOT  --   --   --   --  0.7 0.8  PROT  --   --  6.3* 7.2 5.9* 6.9  ALBUMIN 3.1* 2.9*  --  3.5 3.0* 3.5   No results for input(s): LIPASE, AMYLASE in the last 168 hours. No results for input(s): AMMONIA in the last 168 hours.  Coagulation Profile: No results for input(s): INR, PROTIME in the last 168 hours.  Cardiac Enzymes: No results for input(s): CKTOTAL, CKMB, CKMBINDEX, TROPONINI in the last 168 hours.  BNP (last 3 results) No results for input(s): PROBNP in the last 8760 hours.  Lipid Profile: No results for input(s): CHOL, HDL, LDLCALC, TRIG, CHOLHDL, LDLDIRECT in the last 72 hours.  Thyroid Function Tests: Recent Labs    03/14/20 0811  TSH 1.415    Anemia Panel: No results for input(s): VITAMINB12, FOLATE, FERRITIN, TIBC, IRON, RETICCTPCT in the last 72 hours.  Urine analysis:    Component Value Date/Time   COLORURINE YELLOW 03/07/2020 1128   APPEARANCEUR HAZY (A) 03/07/2020 1128   LABSPEC 1.008 03/07/2020 1128   PHURINE 9.0 (H) 03/07/2020 1128   GLUCOSEU NEGATIVE 03/07/2020 1128   GLUCOSEU NEGATIVE 01/30/2020 1518   HGBUR SMALL (A) 03/07/2020 1128  BILIRUBINUR NEGATIVE 03/07/2020 1128   KETONESUR NEGATIVE 03/07/2020 1128   PROTEINUR 100 (A) 03/07/2020 1128   UROBILINOGEN 0.2 01/30/2020 1518   NITRITE NEGATIVE 03/07/2020 1128   LEUKOCYTESUR LARGE (A) 03/07/2020 1128    Sepsis  Labs: Lactic Acid, Venous    Component Value Date/Time   LATICACIDVEN 0.66 12/08/2016 0605    MICROBIOLOGY: Recent Results (from the past 240 hour(s))  Urine culture     Status: None   Collection Time: 03/06/20  1:11 PM   Specimen: Urine, Random  Result Value Ref Range Status   Specimen Description URINE, RANDOM  Final   Special Requests NONE  Final   Culture   Final    NO GROWTH Performed at [facility], [place] [address], [place], [place] [address]    Report Status 03/08/2020 FINAL  Final  Blood culture (routine x 2)     Status: None   Collection Time: 03/06/20  2:37 PM   Specimen: BLOOD RIGHT FOREARM  Result Value Ref Range Status   Specimen Description BLOOD RIGHT FOREARM  Final   Special Requests   Final    BOTTLES DRAWN AEROBIC AND ANAEROBIC Blood Culture results may not be optimal due to an inadequate volume of blood received in culture bottles   Culture   Final    NO GROWTH 5 DAYS Performed at [facility], [place] [address], [place], [place] [address]    Report Status 03/11/2020 FINAL  Final  Blood culture (routine x 2)     Status: None   Collection Time: 03/06/20  3:15 PM   Specimen: BLOOD  Result Value Ref Range Status   Specimen Description BLOOD RIGHT ANTECUBITAL  Final   Special Requests   Final    BOTTLES DRAWN AEROBIC AND ANAEROBIC Blood Culture results may not be optimal due to an inadequate volume of blood received in culture bottles   Culture   Final    NO GROWTH 5 DAYS Performed at [facility], [place] [address], [place], [place] [address]    Report Status 03/11/2020 FINAL  Final  Respiratory Panel by RT PCR (Flu A&B, Covid) - Nasopharyngeal Swab     Status: None   Collection Time: 03/06/20 11:15 PM   Specimen: Nasopharyngeal Swab  Result Value Ref Range Status   SARS Coronavirus 2 by RT PCR NEGATIVE NEGATIVE Final    Comment: (NOTE) SARS-CoV-2 target nucleic acids are NOT DETECTED. The SARS-CoV-2 RNA is generally detectable in upper  respiratoy specimens during the acute phase of infection. The lowest concentration of SARS-CoV-2 viral copies this assay can detect is 131 copies/mL. A negative result does not preclude SARS-Cov-2 infection and should not be used as the sole basis for treatment or other patient management decisions. A negative result may occur with  improper specimen collection/handling, submission of specimen other than nasopharyngeal swab, presence of viral mutation(s) within the areas targeted by this assay, and inadequate number of viral copies (<131 copies/mL). A negative result must be combined with clinical observations, patient history, and epidemiological information. The expected result is Negative. Fact Sheet for Patients:  PinkCheek.be Fact Sheet for Healthcare Providers:  GravelBags.it This test is not yet ap proved or cleared by the [place] FDA and  has been authorized for detection and/or diagnosis of SARS-CoV-2 by FDA under an Emergency Use Authorization (EUA). This EUA will remain  in effect (meaning this test can be used) for the duration of the COVID-19 declaration under Section 564(b)(1) of the  Act, 21 U.S.C. section 360bbb-3(b)(1), unless the authorization is terminated or revoked sooner.    Influenza A by PCR NEGATIVE NEGATIVE Final   Influenza B by PCR NEGATIVE NEGATIVE Final    Comment: (NOTE) The Xpert Xpress SARS-CoV-2/FLU/RSV assay is intended as an aid in  the diagnosis of influenza from Nasopharyngeal swab specimens and  should not be used as a sole basis for treatment. Nasal washings and  aspirates are unacceptable for Xpert Xpress SARS-CoV-2/FLU/RSV  testing. Fact Sheet for Patients: PinkCheek.be Fact Sheet for Healthcare Providers: GravelBags.it This test is not yet approved or cleared by the [place] FDA and  has been authorized for  detection and/or diagnosis of SARS-CoV-2 by  FDA under an Emergency Use Authorization (EUA). This EUA will remain  in effect (meaning this test can be used) for the duration of the  Covid-19 declaration under Section 564(b)(1) of the Act, 21  U.S.C. section 360bbb-3(b)(1), unless the authorization is  terminated or revoked. Performed at [facility], [place] [address], [place], [place] [address]   SARS CORONAVIRUS 2 (TAT 6-24 HRS) Nasopharyngeal Nasopharyngeal Swab     Status: None   Collection Time: 03/10/20  1:20 PM   Specimen: Nasopharyngeal Swab  Result Value Ref Range Status   SARS Coronavirus 2 NEGATIVE NEGATIVE Final    Comment: (NOTE) SARS-CoV-2 target nucleic acids are NOT DETECTED. The SARS-CoV-2 RNA is generally detectable in upper and lower respiratory specimens during the acute phase of infection. Negative results do not preclude SARS-CoV-2 infection, do not rule out co-infections with other pathogens, and should not be used as the sole basis for treatment or other patient management decisions. Negative results must be combined with clinical observations, patient history, and epidemiological information. The expected result is Negative. Fact Sheet for Patients: SugarRoll.be Fact Sheet for Healthcare Providers: https://www.woods-mathews.com/ This test is not yet approved or cleared by the [place] FDA and  has been authorized for detection and/or diagnosis of SARS-CoV-2 by FDA under an Emergency Use Authorization (EUA). This EUA will remain  in effect (meaning this test can be used) for the duration of the COVID-19 declaration under Section 56 4(b)(1) of the Act, 21 U.S.C. section 360bbb-3(b)(1), unless the authorization is terminated or revoked sooner. Performed at [facility], [place] [address] [place], [place] [address]   MRSA PCR Screening     Status: None   Collection Time: 03/11/20  3:06 AM   Specimen:  Nasal Mucosa; Nasopharyngeal  Result Value Ref Range Status   MRSA by PCR NEGATIVE NEGATIVE Final    Comment:        The GeneXpert MRSA Assay (FDA approved for NASAL specimens only), is one component of a comprehensive MRSA colonization surveillance program. It is not intended to diagnose MRSA infection nor to guide or monitor treatment for MRSA infections. Performed at [facility], [place] [address], [place], [place] [address]   Body fluid culture     Status: None   Collection Time: 03/13/20  4:44 PM   Specimen: Pleura; Body Fluid  Result Value Ref Range Status   Specimen Description PLEURAL FLUID RIGHT  Final   Special Requests Normal  Final   Gram Stain   Final    CYTOSPIN SMEAR WBC PRESENT, PREDOMINANTLY MONONUCLEAR NO ORGANISMS SEEN    Culture   Final    NO GROWTH 3 DAYS Performed at [facility], [address], [place], [place] [address]    Report Status 03/16/2020 FINAL  Final    RADIOLOGY STUDIES/RESULTS: CT ABDOMEN PELVIS W WO CONTRAST  Result Date: 03/15/2020 CLINICAL DATA:  Question insulinoma, end-stage renal disease. EXAM: CT ABDOMEN AND PELVIS WITHOUT AND WITH CONTRAST TECHNIQUE: Multidetector CT imaging of the abdomen and pelvis was performed following the standard protocol before and following the bolus administration of intravenous contrast. CONTRAST:  178mL OMNIPAQUE IOHEXOL 300 MG/ML  SOLN COMPARISON:  03/06/2020 FINDINGS: Lower chest: Small effusions, diminished on the RIGHT when compared to previous study from 03/06/2020. Basilar atelectasis. Mitral annular calcification. Heart size enlarged. Hepatobiliary: No focal hepatic lesion aside from a large hepatic cyst arising from the posterior RIGHT hepatic lobe. The portal vein is patent. No gross biliary ductal dilation. Pancreas: Assessment of the pancreas is limited by respiratory motion. No focal, enhancing hepatic lesion or sign of ductal dilation within the limitations of the exam. Spleen: Spleen  is normal size without focal lesion. Adrenals/Urinary Tract: Low-density LEFT adrenal lesion could potentially even represent a LEFT adrenal cyst evaluation hampered by motion more likely a LEFT adrenal adenoma with variable fat density measuring 2 x 1.7 cm within 1 mm of previous imaging. RIGHT adrenal is normal. Low-density renal lesions compatible with cysts bilaterally more on the LEFT than the RIGHT. No hydronephrosis. No nephrolithiasis. Stomach/Bowel: Moderate to marked gastric distension. No acute small bowel process. The appendix with small appendicular lith otherwise unremarkable. Colon is full of liquid stool and gas without abrupt transition. Scattered colonic diverticulosis. Vascular/Lymphatic: Calcified and noncalcified atheromatous plaque in the abdominal aorta with tortuous iliac vessels. Mild dilation of the LEFT common iliac artery at 1.5 cm. No retroperitoneal or upper abdominal lymphadenopathy. Reproductive: Post hysterectomy. Other: No signs of abdominal wall hernia or abdominal fluid collection. Musculoskeletal: Fluid collection over LEFT hip compatible with evolving hematoma / morell lavellee lesion. Signs of LEFT hip arthroplasty. Spinal degenerative changes. IMPRESSION: 1. No visible pancreatic lesion. Study limited by some motion artifact. 2. LEFT adrenal adenoma, evaluation hampered by motion. Similar to prior studies. 3. Small bilateral pleural effusions, diminished on the RIGHT when compared to previous study. Basilar atelectasis. 4. Moderate to marked gastric distension, new from the prior exam. 5. Fluid collection over LEFT hip compatible with evolving hematoma / morell lavellee lesion. 6. Bilateral renal cysts. 7. Hepatic cyst as before. 8. Aortic atherosclerosis. Aortic Atherosclerosis (ICD10-I70.0). Electronically Signed   By: Zetta Bills M.D.   On: 03/15/2020 19:28   DG Chest Port 1 View  Result Date: 03/15/2020 CLINICAL DATA:  86 year old male with history of shortness of  breath. History of end-stage renal disease. EXAM: PORTABLE CHEST 1 VIEW COMPARISON:  Chest x-ray 03/13/2020. FINDINGS: Right internal jugular PermCath with tip terminating in the right atrium. Lung volumes are slightly low. Opacity at the left base obscuring the left hemidiaphragm which may reflect atelectasis and/or consolidation. No definite pleural effusions. No evidence of pulmonary edema. Mild cardiomegaly. Upper mediastinal contours are within normal limits. Aortic atherosclerosis. IMPRESSION: 1. Support apparatus, as above. 2. Atelectasis and/or consolidation in the left lower lobe. 3. Aortic atherosclerosis. Electronically Signed   By: Vinnie Langton M.D.   On: 03/15/2020 08:13     LOS: 5 days   Signature  Lala Lund M.D on 03/16/2020 at 9:37 AM   -  To page go to [email or web address]

## 2020-03-16 NOTE — Progress Notes (Signed)
   NAME:  Gabriel Guice Benecke Sr., MRN:  [id number], DOB:  [birth year], LOS: 5 ADMISSION DATE:  03/10/2020, CONSULTATION DATE: 03/13/2020 REFERRING MD:  Adella Nissen MD, CHIEF COMPLAINT: Pleural effusion  Brief History   86 year old with end-stage renal disease, hyperlipidemia, depression/dementia admitted for encephalopathy, UTI with persistent right greater than left effusion.  PCCM consulted on 5/8 for thoracentesis.  Past Medical History    has a past medical history of ANEMIA-IRON DEFICIENCY (03/09/2008), Arthritis, Complex renal cyst (06/19/2011), Depression (03/26/2014), DIVERTICULOSIS, COLON (03/09/2008), ESRD (end stage renal disease) ([place]) (03/09/2008), Flu (12/08/2016), GOUT (03/09/2008), Hemorrhoids (08/2009), History of blood transfusion, HYPERLIPIDEMIA (03/09/2008), Hyperparathyroidism ([place]), HYPERTENSION (03/09/2008), Prostate cancer ([place]) (2002), PROSTATE CANCER, HX OF (03/09/2008), Radiation cystitis (2010.), and Stroke [facility]).  Significant Hospital Events   5/8 Admit  Consults:  PCCM  Procedures:  CT chest 03/13/2020-bilateral pleural effusions right greater than left with compressive atelectasis.  Overall increased since 5/1. 5/8 RT thoracentesis >> 600 mL removed  Significant Diagnostic Tests:    Micro Data:    Antimicrobials:    Interim history/subjective:   No dyspnea or chest pain Afebrile  Objective   Blood pressure (!) 148/75, pulse 81, temperature 99.3 F (37.4 C), temperature source Oral, resp. rate 17, height 5\' 9"  (1.753 m), weight 68.9 kg, SpO2 95 %.        Intake/Output Summary (Last 24 hours) at 03/16/2020 1013 Last data filed at 03/16/2020 0525 Gross per 24 hour  Intake 760 ml  Output 3000 ml  Net -2240 ml   Filed Weights   03/15/20 0732 03/15/20 1145 03/15/20 2039  Weight: 74.8 kg 70.5 kg 68.9 kg    Examination: Gen:   Elderly man, pleasant, no distress HEENT:  EOMI, sclera anicteric Neck:     No masses; no thyromegaly Lungs:    Diminished breath sounds at the  base CV:         Regular rate and rhythm; no murmurs Abd:      + bowel sounds; soft, non-tender; no palpable masses, no distension Ext:    No edema; adequate peripheral perfusion Skin:      Warm and dry; no rash Neuro: Awake, responds to commands, not confused  Resolved Hospital Problem list     Assessment & Plan:  Bilateral pleural effusions Likely secondary to volume overload.  CT with no significant underlying consolidation  Reviewed pleural fluid results -Pleural fluid appears to be transudate, cytology negative -Repeat CT abdomen does not show any recurrence of effusions  PCCM available as needed  Kara Mead MD. FCCP. [facility]  If no response to pager , please call [phone]   03/16/2020

## 2020-03-16 NOTE — TOC Progression Note (Signed)
Transition of Care (TOC) - Progression Note    Patient Details  Name: Gabriel Santilli Vanosdol Sr. MRN: [id number] Date of Birth: 13-Sep-1929  Transition of Care [facility]) CM/SW [place], [place] Phone Number: 03/16/2020, 1:33 PM  Clinical Narrative:     CSW reached out to pts son [name] about bed offers. [name] stated that he has has not had an opportunity to talk to someone at each facility to make an educated guess. [name] states that he will reach out to facilities again today to get his questions answered. CSW informed [name] that he may discharge later this week. [name] stated he is going to reach out to the doctor to see if discharge can be pushed until Friday.   Expected Discharge Plan: [place] Barriers to Discharge: Continued Medical Work up  Expected Discharge Plan and Services Expected Discharge Plan: [place] In-house Referral: Clinical Social Work   Post Acute Care Choice: [place] Living arrangements for the past 2 months: Single Family Home                                       Social Determinants of Health (SDOH) Interventions    Readmission Risk Interventions No flowsheet data found.  [name], Latanya Presser, [place] Social Worker [phone]

## 2020-03-16 NOTE — Progress Notes (Signed)
Physical Therapy Treatment Patient Details Name: Gabriel Dooling Gaspard Sr. MRN: [id number] DOB: 26-Aug-1929 Today's Date: 03/16/2020    History of Present Illness Pt is an 86 y/o male with PMH of ESRD on HD MWF, HLD, anemia of chronic disease, orthostatic hypotension, depression who was found to be confused at HD and sent to the hospital for further evaluation. Pt found to be hypoglycemic and with acute metabolic encephalopathy. Of note, pt with recent admission (5/1-5/4) secondary to falls with a SDH.    PT Comments    Continuing work on functional mobility and activity tolerance;  Session focused on bed mobility to sit EOB for ADLs with mod A for balance/support of trunk. Pt required increased time with verbal and physical cues to initiate bed mobility and ADL tasks. Pt Stood at EOB min A +2 with RW and began to have watery BM, pt then transferred to [facility]. Pt required total A with  Hygiene; Pleasant and participatory throughout session  Follow Up Recommendations  SNF     Equipment Recommendations  Other (comment)(defer to next venue of care)    Recommendations for Other Services       Precautions / Restrictions Precautions Precautions: Fall Restrictions Weight Bearing Restrictions: No    Mobility  Bed Mobility Overal bed mobility: Needs Assistance Bed Mobility: Supine to Sit;Sit to Supine Rolling: Mod assist;Min assist;+2 for physical assistance   Supine to sit: Mod assist;Min assist;+2 for safety/equipment     General bed mobility comments: pt required increased time with verbal and physical cues for initiation and task continuation  Transfers Overall transfer level: Needs assistance Equipment used: Rolling walker (2 wheeled) Transfers: Sit to/from Stand Sit to Stand: Min assist;+2 physical assistance         General transfer comment: pt having watery BM upon standing, transferred to [facility]  Ambulation/Gait Ambulation/Gait assistance: Min assist Gait Distance (Feet):  (pivotal steps 3in1 to recliner) Assistive device: Rolling walker (2 wheeled)       General Gait Details: Min assist to steady and turn fully before sitting to recliner   Stairs             Wheelchair Mobility    Modified Rankin (Stroke Patients Only)       Balance Overall balance assessment: Needs assistance;History of Falls Sitting-balance support: Feet supported;Bilateral upper extremity supported Sitting balance-Leahy Scale: Poor Sitting balance - Comments: mod - min A for balance/support of trunk to don socks   Standing balance support: Bilateral upper extremity supported;During functional activity Standing balance-Leahy Scale: Poor Standing balance comment: Pt stood at [facility] with RW for hygiene after BM                            Cognition Arousal/Alertness: Awake/alert Behavior During Therapy: WFL for tasks assessed/performed Overall Cognitive Status: No family/caregiver present to determine baseline cognitive functioning Area of Impairment: Orientation;Following commands;Problem solving                       Following Commands: Follows one step commands inconsistently     Problem Solving: Slow processing;Difficulty sequencing;Requires verbal cues;Requires tactile cues General Comments: hx of dementia per chart      Exercises      General Comments        Pertinent Vitals/Pain Pain Assessment: Faces Faces Pain Scale: Hurts little more Pain Location: Rectal/buttocks during BM hygiene Pain Descriptors / Indicators: Sore;Burning Pain Intervention(s): Monitored during session    Home  Living                      Prior Function            PT Goals (current goals can now be found in the care plan section) Acute Rehab PT Goals Patient Stated Goal: Agreeable to getting up PT Goal Formulation: Patient unable to participate in goal setting Time For Goal Achievement: 03/26/20 Potential to Achieve Goals: Good Progress  towards PT goals: Progressing toward goals    Frequency    Min 2X/week      PT Plan Current plan remains appropriate    Co-evaluation PT/OT/SLP Co-Evaluation/Treatment: Yes Reason for Co-Treatment: For patient/therapist safety;To address functional/ADL transfers PT goals addressed during session: Mobility/safety with mobility OT goals addressed during session: ADL's and self-care      AM-PAC PT "6 Clicks" Mobility   Outcome Measure  Help needed turning from your back to your side while in a flat bed without using bedrails?: A Little Help needed moving from lying on your back to sitting on the side of a flat bed without using bedrails?: A Lot Help needed moving to and from a bed to a chair (including a wheelchair)?: A Lot Help needed standing up from a chair using your arms (e.g., wheelchair or bedside chair)?: A Little Help needed to walk in hospital room?: A Lot Help needed climbing 3-5 steps with a railing? : Total 6 Click Score: 13    End of Session Equipment Utilized During Treatment: Gait belt Activity Tolerance: Patient tolerated treatment well;Other (comment)(limited by loose stools, incontinence of bowel) Patient left: in chair;Other (comment)(at nurse's station, preparing for lunch) Nurse Communication: Mobility status PT Visit Diagnosis: Other abnormalities of gait and mobility (R26.89);Repeated falls (R29.6)     Time: 7371-0626 PT Time Calculation (min) (ACUTE ONLY): 36 min  Charges:  $Therapeutic Activity: 8-22 mins                     Roney Marion, PT  Acute Rehabilitation Services Pager [phone] Office [place] 03/16/2020, 2:51 PM

## 2020-03-16 NOTE — Progress Notes (Signed)
Occupational Therapy Treatment Patient Details Name: [name]. MRN: [id number] DOB: 1929-08-08 Today's Date: 03/16/2020    History of present illness Pt is an 86 y/o male with PMH of ESRD on HD MWF, HLD, anemia of chronic disease, orthostatic hypotension, depression who was found to be confused at HD and sent to the hospital for further evaluation. Pt found to be hypoglycemic and with acute metabolic encephalopathy. Of note, pt with recent admission (5/1-5/4) secondary to falls with a SDH.   OT comments  Pt making consistent progress with functional goals. Session focused on bed mobility to sit OEB for ADLs with mod A for balance/support of trunk. Pt required increased time with verbal and physical cues to initiate bed mobility and ADL tasks. Pt Stood at OEB min A +2 wit RW and began to have watery BM, pt then transferred to [facility]. Pt required total A with clothing mgt and hygiene. Pt very pleasant and cooperative. OT will continue to follow acutely  Follow Up Recommendations  SNF;Supervision/Assistance - 24 hour    Equipment Recommendations  Other (comment)(TBD at SNF)    Recommendations for Other Services      Precautions / Restrictions Precautions Precautions: Fall Restrictions Weight Bearing Restrictions: No       Mobility Bed Mobility Overal bed mobility: Needs Assistance Bed Mobility: Supine to Sit;Sit to Supine Rolling: Mod assist;Min assist;+2 for physical assistance         General bed mobility comments: pt required increased time with verbal and physical cues for initiation and task continuation  Transfers Overall transfer level: Needs assistance Equipment used: Rolling walker (2 wheeled) Transfers: Sit to/from Stand Sit to Stand: Min assist;+2 physical assistance         General transfer comment: pt having watery BM upon standing, transferred to [facility]    Balance Overall balance assessment: Needs assistance;History of Falls Sitting-balance support:  Feet supported;Bilateral upper extremity supported Sitting balance-Leahy Scale: Poor Sitting balance - Comments: mod - min A for balance/support of trunk to don socks   Standing balance support: Bilateral upper extremity supported;During functional activity Standing balance-Leahy Scale: Poor Standing balance comment: Pt stood at [facility] with RW for hygiene after BM                           ADL either performed or assessed with clinical judgement   ADL Overall ADL's : Needs assistance/impaired     Grooming: Wash/dry hands;Wash/dry face;Minimal assistance Grooming Details (indicate cue type and reason): Poor sitting balance EOB Upper Body Bathing: Moderate assistance Upper Body Bathing Details (indicate cue type and reason): simulated     Upper Body Dressing : Moderate assistance;Sitting   Lower Body Dressing: Minimal assistance Lower Body Dressing Details (indicate cue type and reason): min A to don socks, mod A for trunk support/balance Toilet Transfer: Minimal assistance;+2 for safety/equipment;Cueing for safety;Cueing for sequencing;RW;BSC   Toileting- Clothing Manipulation and Hygiene: Total assistance       Functional mobility during ADLs: Minimal assistance;+2 for physical assistance;Cueing for sequencing;Rolling walker;Cueing for safety       Vision Baseline Vision/History: Wears glasses Wears Glasses: At all times Patient Visual Report: No change from baseline     Perception     Praxis      Cognition Arousal/Alertness: Awake/alert Behavior During Therapy: WFL for tasks assessed/performed Overall Cognitive Status: No family/caregiver present to determine baseline cognitive functioning Area of Impairment: Orientation;Following commands;Problem solving  Following Commands: Follows one step commands inconsistently     Problem Solving: Slow processing;Difficulty sequencing;Requires verbal cues;Requires tactile cues General  Comments: hx of dementia per chart        Exercises     Shoulder Instructions       General Comments      Pertinent Vitals/ Pain       Pain Assessment: Faces Faces Pain Scale: Hurts whole lot Pain Location: Rectal/buttocks during BM hygiene Pain Descriptors / Indicators: Sore;Burning Pain Intervention(s): Monitored during session;Repositioned  Home Living                                          Prior Functioning/Environment              Frequency  Min 2X/week        Progress Toward Goals  OT Goals(current goals can now be found in the care plan section)  Progress towards OT goals: Progressing toward goals     Plan Discharge plan remains appropriate    Co-evaluation    PT/OT/SLP Co-Evaluation/Treatment: Yes Reason for Co-Treatment: For patient/therapist safety;To address functional/ADL transfers   OT goals addressed during session: ADL's and self-care;Proper use of Adaptive equipment and DME      AM-PAC OT "6 Clicks" Daily Activity     Outcome Measure   Help from another person eating meals?: A Little Help from another person taking care of personal grooming?: A Little Help from another person toileting, which includes using toliet, bedpan, or urinal?: Total Help from another person bathing (including washing, rinsing, drying)?: A Lot Help from another person to put on and taking off regular upper body clothing?: A Lot Help from another person to put on and taking off regular lower body clothing?: Total 6 Click Score: 12    End of Session Equipment Utilized During Treatment: Gait belt;Other (comment);Rolling walker(BSC)  OT Visit Diagnosis: Muscle weakness (generalized) (M62.81);Other symptoms and signs involving cognitive function;Unsteadiness on feet (R26.81)   Activity Tolerance Patient tolerated treatment well   Patient Left in chair;Other (comment)(At nurse station with SLP)   Nurse Communication          Time:  8416-6063 OT Time Calculation (min): 32 min  Charges: OT General Charges $OT Visit: 1 Visit OT Treatments $Self Care/Home Management : 8-22 mins     Britt Bottom 03/16/2020, 2:06 PM

## 2020-03-16 NOTE — Progress Notes (Signed)
Calorie Count Note  48 hour calorie count ordered.  Diet: dysphagia 1 with thin liquids, 1.2 L fluid restriction Supplements: Nepro Shake po TID, each supplement provides 425 kcal and 19 grams protein; 30 ml Prostat BID, each supplement provides 100 kcals and 15 grams protein; renal MVI daily  Case discussed with RN, who reports pt is consuming 100% of meals.   Pt sitting up at nurse's station with SLP. Per SLP report, pt is tolerating current diet texture well and will continue with current diet order. Pt was able to consume a cracker with difficulty, however, pt requires max supervision with more advanced textures.   Pt sitting in recliner, eating lunch at time of visit. Pt very pleasant and polite, answering most questions with "Yes, thank you" or "This is very good, thank you". He enjoys the food here. Discussed importance of good meal and supplement intake to promote healing.     Most Recent Value  Orbital Region  Mild depletion  Upper Arm Region  Mild depletion  Thoracic and Lumbar Region  Mild depletion  Buccal Region  Moderate depletion  Temple Region  Moderate depletion  Clavicle Bone Region  Moderate depletion  Clavicle and Acromion Bone Region  Moderate depletion  Scapular Bone Region  Moderate depletion  Dorsal Hand  Mild depletion  Patellar Region  Mild depletion  Anterior Thigh Region  Mild depletion  Posterior Calf Region  Mild depletion  Edema (RD Assessment)  None  Hair  Reviewed  Eyes  Reviewed  Mouth  Reviewed  Skin  Reviewed  Nails  Reviewed     5/9-5/10 Breakfast: 632 kcals, 42 grams protein Lunch: 405 kcals, 32 grams protein Dinner: 631 kcals, 34 grams protein Supplements: 2 Nepro supplements (850 kcals, 38 grams protein); 2 Prostat supplement (200 kcals, 30 grams protein)  Total intake: 2709 kcal (>100% of minimum estimated needs)  176 grams protein (>100% of minimum estimated needs)  5/10-5/11 Breakfast: 208 kcals, 9 grams protein Lunch: 404  kcals, 32 grams protein Dinner: 109 kcals, 16 grams protein Supplements: 3 Nepro supplements (1275 kcals, 57 grams protein, 2 Prostat supplements (200 kcals, 30 grams protein)  Total intake: 1996 kcal (100% of minimum estimated needs)  144 grams  protein (>100% of minimum estimated needs)  Average Total intake: 2353 kcal (>100% of minimum estimated needs)  160 grams  protein (>100% of minimum estimated needs)  Nutrition Dx: Moderate malnutrition related to chronic illness(ESRD on HD)as evidenced ,ild to moderate fat and muscle depletion; ongoing  Goal: Patient will meet greater than or equal to 90% of their needs; met    Intervention:   -D/c calorie count -Decrease Nepro Shake po daily, each supplement provides 425 kcal and 19 grams protein -Continue 30 ml Pro-stat BID, each supplement provides 100 kcal and 15 grams protein  -Continue renal MVI daily -Magic cup TID with meals, each supplement provides 290 kcal and 9 grams of protein   W, RD, LDN, CDCES Registered Dietitian II Certified Diabetes Care and Education Specialist Please refer to [facility] for RD and/or RD on-call/weekend/after hours pager 

## 2020-03-17 DIAGNOSIS — E440 Moderate protein-calorie malnutrition: Secondary | ICD-10-CM | POA: Insufficient documentation

## 2020-03-17 LAB — CBC WITH DIFFERENTIAL/PLATELET
Abs Immature Granulocytes: 0.2 10*3/uL — ABNORMAL HIGH (ref 0.00–0.07)
Basophils Absolute: 0 10*3/uL (ref 0.0–0.1)
Basophils Relative: 0 %
Eosinophils Absolute: 0 10*3/uL (ref 0.0–0.5)
Eosinophils Relative: 0 %
HCT: 33.6 % — ABNORMAL LOW (ref 39.0–52.0)
Hemoglobin: 10.4 g/dL — ABNORMAL LOW (ref 13.0–17.0)
Immature Granulocytes: 1 %
Lymphocytes Relative: 3 %
Lymphs Abs: 0.6 10*3/uL — ABNORMAL LOW (ref 0.7–4.0)
MCH: 31.3 pg (ref 26.0–34.0)
MCHC: 31 g/dL (ref 30.0–36.0)
MCV: 101.2 fL — ABNORMAL HIGH (ref 80.0–100.0)
Monocytes Absolute: 1.7 10*3/uL — ABNORMAL HIGH (ref 0.1–1.0)
Monocytes Relative: 9 %
Neutro Abs: 16.8 10*3/uL — ABNORMAL HIGH (ref 1.7–7.7)
Neutrophils Relative %: 87 %
Platelets: 356 10*3/uL (ref 150–400)
RBC: 3.32 MIL/uL — ABNORMAL LOW (ref 4.22–5.81)
RDW: 19.9 % — ABNORMAL HIGH (ref 11.5–15.5)
WBC: 19.4 10*3/uL — ABNORMAL HIGH (ref 4.0–10.5)
nRBC: 0.1 % (ref 0.0–0.2)

## 2020-03-17 LAB — COMPREHENSIVE METABOLIC PANEL
ALT: 68 U/L — ABNORMAL HIGH (ref 0–44)
AST: 92 U/L — ABNORMAL HIGH (ref 15–41)
Albumin: 3.5 g/dL (ref 3.5–5.0)
Alkaline Phosphatase: 77 U/L (ref 38–126)
Anion gap: 17 — ABNORMAL HIGH (ref 5–15)
BUN: 57 mg/dL — ABNORMAL HIGH (ref 8–23)
CO2: 20 mmol/L — ABNORMAL LOW (ref 22–32)
Calcium: 9.5 mg/dL (ref 8.9–10.3)
Chloride: 96 mmol/L — ABNORMAL LOW (ref 98–111)
Creatinine, Ser: 6.33 mg/dL — ABNORMAL HIGH (ref 0.61–1.24)
GFR calc Af Amer: 8 mL/min — ABNORMAL LOW (ref >60)
GFR calc non Af Amer: 7 mL/min — ABNORMAL LOW (ref >60)
Glucose, Bld: 143 mg/dL — ABNORMAL HIGH (ref 70–99)
Potassium: 3.8 mmol/L (ref 3.5–5.1)
Sodium: 133 mmol/L — ABNORMAL LOW (ref 135–145)
Total Bilirubin: 0.6 mg/dL (ref 0.3–1.2)
Total Protein: 6.6 g/dL (ref 6.5–8.1)

## 2020-03-17 LAB — GLUCOSE, CAPILLARY
Glucose-Capillary: 109 mg/dL — ABNORMAL HIGH (ref 70–99)
Glucose-Capillary: 114 mg/dL — ABNORMAL HIGH (ref 70–99)
Glucose-Capillary: 12 mg/dL — CL (ref 70–99)
Glucose-Capillary: 24 mg/dL — CL (ref 70–99)
Glucose-Capillary: 41 mg/dL — CL (ref 70–99)
Glucose-Capillary: 50 mg/dL — ABNORMAL LOW (ref 70–99)
Glucose-Capillary: 60 mg/dL — ABNORMAL LOW (ref 70–99)
Glucose-Capillary: 83 mg/dL (ref 70–99)
Glucose-Capillary: 86 mg/dL (ref 70–99)
Glucose-Capillary: 86 mg/dL (ref 70–99)
Glucose-Capillary: 86 mg/dL (ref 70–99)
Glucose-Capillary: 98 mg/dL (ref 70–99)

## 2020-03-17 LAB — BRAIN NATRIURETIC PEPTIDE: B Natriuretic Peptide: 1716.7 pg/mL — ABNORMAL HIGH (ref 0.0–100.0)

## 2020-03-17 LAB — PHOSPHORUS: Phosphorus: 4 mg/dL (ref 2.5–4.6)

## 2020-03-17 LAB — MAGNESIUM: Magnesium: 2.1 mg/dL (ref 1.7–2.4)

## 2020-03-17 MED ORDER — PREDNISONE 50 MG PO TABS: 60.0000 mg | ORAL_TABLET | Freq: Every day | ORAL | Status: DC

## 2020-03-17 MED ORDER — CARVEDILOL 3.125 MG PO TABS
3.1250 mg | ORAL_TABLET | Freq: Two times a day (BID) | ORAL | Status: DC
  Administered 2020-03-17 – 2020-03-22 (×12): 3.125 mg via ORAL
  Filled 2020-03-17 (×11): qty 1

## 2020-03-17 MED ORDER — DEXTROSE 50 % IV SOLN
INTRAVENOUS | Status: AC
  Administered 2020-03-17: 50 mL
  Filled 2020-03-17: qty 50

## 2020-03-17 MED ORDER — DEXTROSE 50 % IV SOLN: 50.0000 mL | Freq: Once | INTRAVENOUS | Status: AC

## 2020-03-17 MED ORDER — ZINC OXIDE 40 % EX OINT
TOPICAL_OINTMENT | Freq: Two times a day (BID) | CUTANEOUS | Status: DC
  Filled 2020-03-17 (×2): qty 57

## 2020-03-17 MED ORDER — PREDNISONE 50 MG PO TABS
80.0000 mg | ORAL_TABLET | Freq: Every day | ORAL | Status: DC
  Administered 2020-03-17: 80 mg via ORAL
  Filled 2020-03-17: qty 1

## 2020-03-17 MED ORDER — DEXTROSE 50 % IV SOLN
INTRAVENOUS | Status: AC
  Administered 2020-03-17: 25 mL
  Filled 2020-03-17: qty 50

## 2020-03-17 NOTE — Progress Notes (Signed)
Inpatient Diabetes Program Recommendations  AACE/ADA: New Consensus Statement on Inpatient Glycemic Control (2015)  Target Ranges:  Prepandial:   less than 140 mg/dL      Peak postprandial:   less than 180 mg/dL (1-2 hours)      Critically ill patients:  140 - 180 mg/dL   Lab Results  Component Value Date   GLUCAP 114 (H) 03/17/2020   HGBA1C 5.5 03/06/2020    Review of Glycemic Control Results for [name], Gabriel SR. (MRN [id number]) as of 03/17/2020 11:02  Ref. Range 03/17/2020 07:53 03/17/2020 08:27 03/17/2020 09:22  Glucose-Capillary Latest Ref Range: 70 - 99 mg/dL 41 (LL) 24 (LL) 114 (H)   Diabetes history: No history Outpatient Diabetes medications: none Current orders for Inpatient glycemic control: none Prednisone 80 mg QAM  Inpatient Diabetes Program Recommendations:    Noted significant hypoglycemia of 24 mg/dL this AM. Patient ESRD. Noted unusually high c-peptide, r/o infection and on Prednisone. Awaiting SNF bed.  Per MD note, reached out to endocrinology.  If to remain inpatient, consider D5%?  Thanks, Bronson Curb, MSN, RNC-OB Diabetes Coordinator [phone] (8a-5p)

## 2020-03-17 NOTE — Consult Note (Signed)
   [facility] CM Inpatient Consult   03/17/2020  Gabriel Robarge Gregson Sr. 1929/05/09 [id number]  [facility] ACO Patient:  Medicare NextGen   Patient screened for follow up of LOS to check if potential [facility] services are needed.  Review of patient's medical record of Physical Therapy and Occupational Therapy notes reveals patient is being recommended for a skilled nursing facility level of care.  Review of MD brief narrative is included but not limited to as follows:  Patient is a 86 y.o. male with history of ESRD on HD MWF, HLD, anemia of chronic disease, orthostatic hypotension, depression/dementia who was found to be confused (more than usual) at HD and sent to the hospital for further evaluation  Community chart review reveals the patient also has had both Moderna Covid vaccines on 01/08/20 and 02/10/20 is noted.  Primary Care Provider is [name], MD [facility] is listed to provide the transition of care follow up.  Plan:  Follow up with inpatient Transition Of Care [TOC] team regarding post hospital follow up needs. Patient's son noted following bed offers.  Continue to follow progress and disposition to assess for post hospital care management needs.  Will have [facility] RN PAC nurse to follow if goes to a [facility] facility to assist with returning home transitional care needs.  Please place a [facility] Care Management consult as appropriate and for questions contact:   Natividad Brood, RN BSN [facility] Liaison  [phone] business mobile phone Toll free office [phone]  Fax number: [phone] [email or web address]

## 2020-03-17 NOTE — Progress Notes (Signed)
Hypoglycemic Event  CBG: 60 @04 :13  Treatment: given 1 cup apple juice, 1 cup vanilla pudding, 1 cup applesauce   Symptoms:asymptomatic  Follow-up CBG: 50 @ 05:16  Treatment: D50 1/2 amp 53ml IV  Ff-up CBG: 83 @ 05:48  Possible Reasons for Event: unknown     Babs Sciara, RN

## 2020-03-17 NOTE — Progress Notes (Addendum)
PROGRESS NOTE        PATIENT DETAILS Name: Gabriel Brooking Pipkins Sr. Age: 86 y.o. Sex: male Date of Birth: November 11, 1928 Admit Date: 03/10/2020 Admitting Physician Evalee Mutton Kristeen Mans, MD BTD:VVOH, Hunt Oris, MD  Brief Narrative:  Patient is a 86 y.o. male with history of ESRD on HD MWF, HLD, anemia of chronic disease, orthostatic hypotension, depression/dementia who was found to be confused (more than usual) at HD and sent to the hospital for further evaluation.  See below for further details.  Significant events:  5/5>> readmit to [facility] for evaluation of encephalopathy/hypoglycemia. 5/1-5/4>> admit to [facility]-frequent falls-found to have SDH, left hip hematoma, acute blood loss anemia requiring 2 units of PRBC  Antimicrobial therapy:  Rocephin: 5/5>>5/8  Microbiology data: None  Procedures :  5/8 >>  Right-sided ultrasound-guided thoracentesis by pulmonary critical care, transudative fluid  5/8>> CT chest: Slight enlargement of bilateral pleural effusions, compressive atelectasis bilateral lower lobes 5/8>> CT head: Minimal decrease in SDH  Consults:  Nephrology PCCM  DVT Prophylaxis :  SCD's  Subjective:  Patient in bed, appears comfortable, denies any headache, no fever, no chest pain or pressure, no shortness of breath , no abdominal pain. No focal weakness.   Assessment/Plan:  Recurrent hypoglycemia: Continues to recur-etiology uncertain -  suspect that this is from frailty/poor oral intake/underlying ESRD.  But given recurrent hypoglycemia (was also occurring last admission) - pending  ACTH, C-peptide, proinsulin, sulfonylureas level. Stable A1c along with stable TSH, stable ACTH stimulation test.  C-peptide was quite high  although he was on low-dose D5 drip at that time however it still quite significant, stable CT abdomen pelvis with and without contrast to rules out an obvious pancreatic lesion, discussed with endocrinologist Dr. [name] on  03/15/2020, have increased oral prednisone further on 03/17/2020, will require outpatient endocrine follow-up with Dr. [name] within 3 to 4 days of discharge.  He does seem to have inappropriately high C-peptide.   Recent Labs    03/17/20 0753 03/17/20 0827 03/17/20 0922  GLUCAP 41* 24* 114*    Acute metabolic encephalopathy: Does have underlying dementia-but mental status has improved compared to on admission.  Suspect etiology of encephalopathy to be a combination of hypoglycemia, recent SDH/ongoing UTI/infection.  CT head on admission showed decreased size of the SDH.  Continue supportive care.    Complicated UTI/ ?Asp PNA with persistent bilateral pleural effusion (right>>left): Was adequately treated with antibiotics, underwent diagnostic right sided thoracentesis showing transudative fluid suggestive of volume overload due to missed dialysis.  Clinically much improved.  Speech following for underlying mild dysphagia.  Currently no signs of active infection.  ESRD on HD MWF: Nephrology following and directing care  Recent SDH: Following a mechanical fall (last admission)-repeat CT head this admission shows decreased size of SDH.  Recent left hip hematoma: Following a mechanical fall (last admission)-supportive care.  Hemoglobin currently stable-Did require PRBC transfusion last admission.  Anemia: Related to ESRD-hemoglobin stable with no need for transfusion.  Orthostatic hypotension: Continue midodrine-follow.  Depression: Continue Celexa  History of dementia: Stable-encephalopathy improving-suspect mentation not far from usual baseline.  Gout: No flare-continue allopurinol    Diet:  Diet Order            DIET - DYS 1 Room service appropriate? No; Fluid consistency: Thin  Diet effective now  Code Status: Full code  Family Communication:  Patient's son [name] [phone]) on 03/14/20, 03/15/20, called her son on 03/17/2020 at 10:26 AM, full  voicemail  Disposition Plan: Status is: Inpatient  Remains inpatient appropriate because:Altered mental status  Dispo: The patient is from: Home              Anticipated d/c is to: SNF              Anticipated d/c date is: As soon as SNF bed available              Patient currently is medically stable to d/c.   Barriers to Discharge: Hypoglycemia/ongoing encephalopathy/recurrent pleural effusion   Antimicrobial agents: Anti-infectives (From admission, onward)   Start     Dose/Rate Route Frequency Ordered Stop   03/10/20 2200  amoxicillin-clavulanate (AUGMENTIN) 500-125 MG per tablet 500 mg  Status:  Discontinued     500 mg Oral Daily at bedtime 03/10/20 1500 03/10/20 1752   03/10/20 1800  cefTRIAXone (ROCEPHIN) 2 g in sodium chloride 0.9 % 100 mL IVPB  Status:  Discontinued     2 g 200 mL/hr over 30 Minutes Intravenous Every 24 hours 03/10/20 1752 03/13/20 1421       Time spent:  25 minutes-Greater than 50% of this time was spent in counseling, explanation of diagnosis, planning of further management, and coordination of care.  MEDICATIONS:  Scheduled Meds:  . allopurinol  100 mg Oral Daily  . aspirin EC  81 mg Oral Daily  . calcitRIOL  1 mcg Oral Q M,W,F-HD  . carvedilol  3.125 mg Oral BID WC  . Chlorhexidine Gluconate Cloth  6 each Topical Q0600  . cinacalcet  30 mg Oral Q M,W,F-HD  . citalopram  10 mg Oral Daily  . darbepoetin (ARANESP) injection - DIALYSIS  60 mcg Intravenous Q Thu-HD  . feeding supplement (NEPRO CARB STEADY)  237 mL Oral QHS  . feeding supplement (PRO-STAT SUGAR FREE 64)  30 mL Oral BID  . BioGaia Probiotic  5 drop Oral Q2000  . latanoprost  1 drop Both Eyes QHS  . midodrine  10 mg Oral TID  . multivitamin  1 tablet Oral QHS  . predniSONE  80 mg Oral Q breakfast  . rosuvastatin  20 mg Oral QHS   Continuous Infusions:  PRN Meds:.acetaminophen **OR** [DISCONTINUED] acetaminophen, albuterol, nystatin, sodium chloride flush   PHYSICAL  EXAM: Vital signs: Vitals:   03/16/20 1708 03/16/20 2109 03/17/20 0416 03/17/20 0919  BP: (!) 145/97 (!) 142/67 (!) 168/93 (!) 143/88  Pulse: 81 80 82 77  Resp: 16 19 18 16   Temp: 98.5 F (36.9 C) 99.1 F (37.3 C) 97.7 F (36.5 C) (!) 96.8 F (36 C)  TempSrc: Oral Oral Oral Oral  SpO2: 96% 98%  100%  Weight:      Height:       Filed Weights   03/15/20 0732 03/15/20 1145 03/15/20 2039  Weight: 74.8 kg 70.5 kg 68.9 kg   Body mass index is 22.45 kg/m.   EXAM  Awake Alert, No new F.N deficits,   [place].AT,PERRAL Supple Neck,No JVD, No cervical lymphadenopathy appriciated.  Symmetrical Chest wall movement, Good air movement bilaterally, CTAB RRR,No Gallops, Rubs or new Murmurs, No Parasternal Heave +ve B.Sounds, Abd Soft, No tenderness, No organomegaly appriciated, No rebound - guarding or rigidity. No Cyanosis, Clubbing or edema, No new Rash or bruise   I have personally reviewed following labs and imaging studies  LABORATORY DATA: CBC: Recent  Labs  Lab 03/11/20 0500 03/12/20 0815 03/14/20 0811 03/15/20 0800 03/16/20 0549  WBC 10.9* 9.7 16.0* 15.7* 14.5*  NEUTROABS  --   --   --  12.4* 12.7*  HGB 9.4* 9.6* 11.6* 9.2* 11.0*  HCT 29.3* 30.3* 37.7* 30.0* 35.3*  MCV 96.1 98.1 101.3* 101.7* 101.7*  PLT 265 280 280 292 259    Basic Metabolic Panel: Recent Labs  Lab 03/10/20 1204 03/10/20 1204 03/11/20 0500 03/11/20 0500 03/12/20 0814 03/13/20 0442 03/14/20 0811 03/15/20 0800 03/16/20 0549  NA 135   < > 131*   < > 133* 137 136 135 136  K 4.4   < > 4.2   < > 3.3* 3.8 4.1 4.0 4.3  CL 93*   < > 88*   < > 97* 96* 95* 95* 96*  CO2 28   < > 28   < > 28 28 25 30 26   GLUCOSE 95   < > 90   < > 99 100* 88 101* 105*  BUN 31*   < > 34*   < > 11 16 34* 49* 29*  CREATININE 6.63*   < > 7.38*   < > 4.34* 3.17* 4.92* 6.85* 4.00*  CALCIUM 9.9   < > 9.5   < > 8.7* 9.5 10.5* 9.8 9.3  MG  --   --   --   --   --   --  2.4 2.6* 2.2  PHOS 2.8  --  2.9  --  1.9* 2.2* 2.5  --   --     < > = values in this interval not displayed.    GFR: Estimated Creatinine Clearance: 12.4 mL/min (A) (by C-G formula based on SCr of 4 mg/dL (H)).  Liver Function Tests: Recent Labs  Lab 03/11/20 0500 03/12/20 0814 03/13/20 1715 03/14/20 0811 03/15/20 0800 03/16/20 0549  AST  --   --   --   --  47* 106*  ALT  --   --   --   --  28 70*  ALKPHOS  --   --   --   --  68 85  BILITOT  --   --   --   --  0.7 0.8  PROT  --   --  6.3* 7.2 5.9* 6.9  ALBUMIN 3.1* 2.9*  --  3.5 3.0* 3.5   No results for input(s): LIPASE, AMYLASE in the last 168 hours. No results for input(s): AMMONIA in the last 168 hours.  Coagulation Profile: No results for input(s): INR, PROTIME in the last 168 hours.  Cardiac Enzymes: No results for input(s): CKTOTAL, CKMB, CKMBINDEX, TROPONINI in the last 168 hours.  BNP (last 3 results) No results for input(s): PROBNP in the last 8760 hours.  Lipid Profile: No results for input(s): CHOL, HDL, LDLCALC, TRIG, CHOLHDL, LDLDIRECT in the last 72 hours.  Thyroid Function Tests: No results for input(s): TSH, T4TOTAL, FREET4, T3FREE, THYROIDAB in the last 72 hours.  Anemia Panel: No results for input(s): VITAMINB12, FOLATE, FERRITIN, TIBC, IRON, RETICCTPCT in the last 72 hours.  Urine analysis:    Component Value Date/Time   COLORURINE YELLOW 03/07/2020 1128   APPEARANCEUR HAZY (A) 03/07/2020 1128   LABSPEC 1.008 03/07/2020 1128   PHURINE 9.0 (H) 03/07/2020 1128   GLUCOSEU NEGATIVE 03/07/2020 1128   GLUCOSEU NEGATIVE 01/30/2020 1518   HGBUR SMALL (A) 03/07/2020 1128   BILIRUBINUR NEGATIVE 03/07/2020 1128   [place] 03/07/2020 1128   PROTEINUR 100 (A) 03/07/2020 1128  UROBILINOGEN 0.2 01/30/2020 1518   NITRITE NEGATIVE 03/07/2020 1128   LEUKOCYTESUR LARGE (A) 03/07/2020 1128    Sepsis Labs: Lactic Acid, Venous    Component Value Date/Time   LATICACIDVEN 0.66 12/08/2016 0605    MICROBIOLOGY: Recent Results (from the past 240 hour(s))   SARS CORONAVIRUS 2 (TAT 6-24 HRS) Nasopharyngeal Nasopharyngeal Swab     Status: None   Collection Time: 03/10/20  1:20 PM   Specimen: Nasopharyngeal Swab  Result Value Ref Range Status   SARS Coronavirus 2 NEGATIVE NEGATIVE Final    Comment: (NOTE) SARS-CoV-2 target nucleic acids are NOT DETECTED. The SARS-CoV-2 RNA is generally detectable in upper and lower respiratory specimens during the acute phase of infection. Negative results do not preclude SARS-CoV-2 infection, do not rule out co-infections with other pathogens, and should not be used as the sole basis for treatment or other patient management decisions. Negative results must be combined with clinical observations, patient history, and epidemiological information. The expected result is Negative. Fact Sheet for Patients: SugarRoll.be Fact Sheet for Healthcare Providers: https://www.woods-mathews.com/ This test is not yet approved or cleared by the [place] FDA and  has been authorized for detection and/or diagnosis of SARS-CoV-2 by FDA under an Emergency Use Authorization (EUA). This EUA will remain  in effect (meaning this test can be used) for the duration of the COVID-19 declaration under Section 56 4(b)(1) of the Act, 21 U.S.C. section 360bbb-3(b)(1), unless the authorization is terminated or revoked sooner. Performed at [facility], [place] [address] [place], [place] [address]   MRSA PCR Screening     Status: None   Collection Time: 03/11/20  3:06 AM   Specimen: Nasal Mucosa; Nasopharyngeal  Result Value Ref Range Status   MRSA by PCR NEGATIVE NEGATIVE Final    Comment:        The GeneXpert MRSA Assay (FDA approved for NASAL specimens only), is one component of a comprehensive MRSA colonization surveillance program. It is not intended to diagnose MRSA infection nor to guide or monitor treatment for MRSA infections. Performed at [facility], [place] [address] [place], [place] [address]   Body fluid culture     Status: None   Collection Time: 03/13/20  4:44 PM   Specimen: Pleura; Body Fluid  Result Value Ref Range Status   Specimen Description PLEURAL FLUID RIGHT  Final   Special Requests Normal  Final   Gram Stain   Final    CYTOSPIN SMEAR WBC PRESENT, PREDOMINANTLY MONONUCLEAR NO ORGANISMS SEEN    Culture   Final    NO GROWTH 3 DAYS Performed at [facility], [address] [place], [place] [address]    Report Status 03/16/2020 FINAL  Final    RADIOLOGY STUDIES/RESULTS: CT ABDOMEN PELVIS W WO CONTRAST  Result Date: 03/15/2020 CLINICAL DATA:  Question insulinoma, end-stage renal disease. EXAM: CT ABDOMEN AND PELVIS WITHOUT AND WITH CONTRAST TECHNIQUE: Multidetector CT imaging of the abdomen and pelvis was performed following the standard protocol before and following the bolus administration of intravenous contrast. CONTRAST:  158mL OMNIPAQUE IOHEXOL 300 MG/ML  SOLN COMPARISON:  03/06/2020 FINDINGS: Lower chest: Small effusions, diminished on the RIGHT when compared to previous study from 03/06/2020. Basilar atelectasis. Mitral annular calcification. Heart size enlarged. Hepatobiliary: No focal hepatic lesion aside from a large hepatic cyst arising from the posterior RIGHT hepatic lobe. The portal vein is patent. No gross biliary ductal dilation. Pancreas: Assessment of the pancreas is limited by respiratory motion. No  focal, enhancing hepatic lesion or sign of ductal dilation within the limitations of the exam. Spleen: Spleen is normal size without focal lesion. Adrenals/Urinary Tract: Low-density LEFT adrenal lesion could potentially even represent a LEFT adrenal cyst evaluation hampered by motion more likely a LEFT adrenal adenoma with variable fat density measuring 2 x 1.7 cm within 1 mm of previous imaging. RIGHT adrenal is normal. Low-density renal lesions compatible with cysts bilaterally more on the LEFT than the RIGHT. No  hydronephrosis. No nephrolithiasis. Stomach/Bowel: Moderate to marked gastric distension. No acute small bowel process. The appendix with small appendicular lith otherwise unremarkable. Colon is full of liquid stool and gas without abrupt transition. Scattered colonic diverticulosis. Vascular/Lymphatic: Calcified and noncalcified atheromatous plaque in the abdominal aorta with tortuous iliac vessels. Mild dilation of the LEFT common iliac artery at 1.5 cm. No retroperitoneal or upper abdominal lymphadenopathy. Reproductive: Post hysterectomy. Other: No signs of abdominal wall hernia or abdominal fluid collection. Musculoskeletal: Fluid collection over LEFT hip compatible with evolving hematoma / morell lavellee lesion. Signs of LEFT hip arthroplasty. Spinal degenerative changes. IMPRESSION: 1. No visible pancreatic lesion. Study limited by some motion artifact. 2. LEFT adrenal adenoma, evaluation hampered by motion. Similar to prior studies. 3. Small bilateral pleural effusions, diminished on the RIGHT when compared to previous study. Basilar atelectasis. 4. Moderate to marked gastric distension, new from the prior exam. 5. Fluid collection over LEFT hip compatible with evolving hematoma / morell lavellee lesion. 6. Bilateral renal cysts. 7. Hepatic cyst as before. 8. Aortic atherosclerosis. Aortic Atherosclerosis (ICD10-I70.0). Electronically Signed   By: Zetta Bills M.D.   On: 03/15/2020 19:28     LOS: 6 days   Signature  Lala Lund M.D on 03/17/2020 at 10:24 AM   -  To page go to [email or web address]

## 2020-03-17 NOTE — Plan of Care (Signed)
  Problem: Activity: Goal: Risk for activity intolerance will decrease Outcome: Progressing   Problem: Nutrition: Goal: Adequate nutrition will be maintained Outcome: Progressing   

## 2020-03-17 NOTE — Progress Notes (Signed)
[facility] Progress Note   Subjective:   Patient seen and examined in room. Sitting in bedside chair.  No specific complaints.  States "I'm here."  Denies CP, SOB, n/v/d and abdominal pain.  Objective Vitals:   03/16/20 1708 03/16/20 2109 03/17/20 0416 03/17/20 0919  BP: (!) 145/97 (!) 142/67 (!) 168/93 (!) 143/88  Pulse: 81 80 82 77  Resp: 16 19 18 16   Temp: 98.5 F (36.9 C) 99.1 F (37.3 C) 97.7 F (36.5 C) (!) 96.8 F (36 C)  TempSrc: Oral Oral Oral Oral  SpO2: 96% 98%  100%  Weight:      Height:       Physical Exam General:WD, chronically ill appearing, frail, elderly male in NAD Heart:RRR Lungs:CTAB, nml WOB Abdomen:soft, NTND Extremities:no LE edema Dialysis Access: [facility]   Filed Weights   03/15/20 0732 03/15/20 1145 03/15/20 2039  Weight: 74.8 kg 70.5 kg 68.9 kg    Intake/Output Summary (Last 24 hours) at 03/17/2020 1339 Last data filed at 03/17/2020 1300 Gross per 24 hour  Intake 1490 ml  Output --  Net 1490 ml    Additional Objective Labs: Basic Metabolic Panel: Recent Labs  Lab 03/12/20 0814 03/12/20 0814 03/13/20 0442 03/13/20 0442 03/14/20 0811 03/14/20 0811 03/15/20 0800 03/16/20 0549 03/17/20 1048  NA 133*   < > 137   < > 136   < > 135 136 133*  K 3.3*   < > 3.8   < > 4.1   < > 4.0 4.3 3.8  CL 97*   < > 96*   < > 95*   < > 95* 96* 96*  CO2 28   < > 28   < > 25   < > 30 26 20*  GLUCOSE 99   < > 100*   < > 88   < > 101* 105* 143*  BUN 11   < > 16   < > 34*   < > 49* 29* 57*  CREATININE 4.34*   < > 3.17*   < > 4.92*   < > 6.85* 4.00* 6.33*  CALCIUM 8.7*   < > 9.5   < > 10.5*   < > 9.8 9.3 9.5  PHOS 1.9*  --  2.2*  --  2.5  --   --   --   --    < > = values in this interval not displayed.   Liver Function Tests: Recent Labs  Lab 03/15/20 0800 03/16/20 0549 03/17/20 1048  AST 47* 106* 92*  ALT 28 70* 68*  ALKPHOS 68 85 77  BILITOT 0.7 0.8 0.6  PROT 5.9* 6.9 6.6  ALBUMIN 3.0* 3.5 3.5   CBC: Recent Labs  Lab  03/12/20 0815 03/12/20 0815 03/14/20 0811 03/14/20 0811 03/15/20 0800 03/16/20 0549 03/17/20 1048  WBC 9.7   < > 16.0*   < > 15.7* 14.5* 19.4*  NEUTROABS  --   --   --   --  12.4* 12.7* 16.8*  HGB 9.6*   < > 11.6*   < > 9.2* 11.0* 10.4*  HCT 30.3*   < > 37.7*   < > 30.0* 35.3* 33.6*  MCV 98.1  --  101.3*  --  101.7* 101.7* 101.2*  PLT 280   < > 280   < > 292 308 356   < > = values in this interval not displayed.   CBG: Recent Labs  Lab 03/17/20 0548 03/17/20 0753 03/17/20 0827 03/17/20 3664 03/17/20  1147  GLUCAP 83 41* 24* 114* 86   Iron Studies: No results for input(s): IRON, TIBC, TRANSFERRIN, FERRITIN in the last 72 hours. Lab Results  Component Value Date   INR 1.1 11/27/2019   INR 1.24 10/05/2016   INR 1.36 12/06/2015   Studies/Results: CT ABDOMEN PELVIS W WO CONTRAST  Result Date: 03/15/2020 CLINICAL DATA:  Question insulinoma, end-stage renal disease. EXAM: CT ABDOMEN AND PELVIS WITHOUT AND WITH CONTRAST TECHNIQUE: Multidetector CT imaging of the abdomen and pelvis was performed following the standard protocol before and following the bolus administration of intravenous contrast. CONTRAST:  165mL OMNIPAQUE IOHEXOL 300 MG/ML  SOLN COMPARISON:  03/06/2020 FINDINGS: Lower chest: Small effusions, diminished on the RIGHT when compared to previous study from 03/06/2020. Basilar atelectasis. Mitral annular calcification. Heart size enlarged. Hepatobiliary: No focal hepatic lesion aside from a large hepatic cyst arising from the posterior RIGHT hepatic lobe. The portal vein is patent. No gross biliary ductal dilation. Pancreas: Assessment of the pancreas is limited by respiratory motion. No focal, enhancing hepatic lesion or sign of ductal dilation within the limitations of the exam. Spleen: Spleen is normal size without focal lesion. Adrenals/Urinary Tract: Low-density LEFT adrenal lesion could potentially even represent a LEFT adrenal cyst evaluation hampered by motion more  likely a LEFT adrenal adenoma with variable fat density measuring 2 x 1.7 cm within 1 mm of previous imaging. RIGHT adrenal is normal. Low-density renal lesions compatible with cysts bilaterally more on the LEFT than the RIGHT. No hydronephrosis. No nephrolithiasis. Stomach/Bowel: Moderate to marked gastric distension. No acute small bowel process. The appendix with small appendicular lith otherwise unremarkable. Colon is full of liquid stool and gas without abrupt transition. Scattered colonic diverticulosis. Vascular/Lymphatic: Calcified and noncalcified atheromatous plaque in the abdominal aorta with tortuous iliac vessels. Mild dilation of the LEFT common iliac artery at 1.5 cm. No retroperitoneal or upper abdominal lymphadenopathy. Reproductive: Post hysterectomy. Other: No signs of abdominal wall hernia or abdominal fluid collection. Musculoskeletal: Fluid collection over LEFT hip compatible with evolving hematoma / morell lavellee lesion. Signs of LEFT hip arthroplasty. Spinal degenerative changes. IMPRESSION: 1. No visible pancreatic lesion. Study limited by some motion artifact. 2. LEFT adrenal adenoma, evaluation hampered by motion. Similar to prior studies. 3. Small bilateral pleural effusions, diminished on the RIGHT when compared to previous study. Basilar atelectasis. 4. Moderate to marked gastric distension, new from the prior exam. 5. Fluid collection over LEFT hip compatible with evolving hematoma / morell lavellee lesion. 6. Bilateral renal cysts. 7. Hepatic cyst as before. 8. Aortic atherosclerosis. Aortic Atherosclerosis (ICD10-I70.0). Electronically Signed   By: Zetta Bills M.D.   On: 03/15/2020 19:28    Medications:  . allopurinol  100 mg Oral Daily  . aspirin EC  81 mg Oral Daily  . calcitRIOL  1 mcg Oral Q M,W,F-HD  . carvedilol  3.125 mg Oral BID WC  . Chlorhexidine Gluconate Cloth  6 each Topical Q0600  . cinacalcet  30 mg Oral Q M,W,F-HD  . citalopram  10 mg Oral Daily  .  darbepoetin (ARANESP) injection - DIALYSIS  60 mcg Intravenous Q Thu-HD  . feeding supplement (NEPRO CARB STEADY)  237 mL Oral QHS  . feeding supplement (PRO-STAT SUGAR FREE 64)  30 mL Oral BID  . BioGaia Probiotic  5 drop Oral Q2000  . latanoprost  1 drop Both Eyes QHS  . midodrine  10 mg Oral TID  . multivitamin  1 tablet Oral QHS  . predniSONE  80 mg Oral  Q breakfast  . rosuvastatin  20 mg Oral QHS    Dialysis Orders: [facility]- MWF 4 hours, 400 BFR via [facility]DC EDW 66- 2 K, 2 calc bath. No heparin  Calcitriol 1.5 mcg and sensipar 30 q HD, mircera 30  Assessment/Plan: 1.Confusion- waxing and waning, baseline dementia, hypoglycemic on arrival, +UTI, recent SDH. Rocephin given.  2.Hypoglycemia- per primary--> CT abd 5/10 with no obvious pancreatic lesion.  On PO prednisone and to f/u as OP w/Endocrine.  3. Bilateral pleural effusions - s/p thoracentesis.  CT abd showed no reoccurrence.  4. ESRD-on HD MWF. No Heparin. Orders written for HD tomorrow per regular schedule. K 3.8  5. Anemiaof CKD-Hgb 10.4. ESA dosed on 5/3 6. Secondary hyperparathyroidism- Caa little elevated,decrease VDRA.Phosimproved to 2.5s/p IV repletion.  Low d/t poor intake. Recheck phos.Hold binders.Continue sensipar 7.HTN/volume- Blood pressureslightly elevated this AM.Does not appear volume overloaded. If weights correct remains 2L over dry post HD yesterday netUF 3L removed.Titrate down volume as tolerated, try to get closer to dry, net UF 3-3.5L today.  Requesting hoyer weight to better assess edw.  8. Nutrition- Renal diet w/fluid restrictions.  Jen Mow, PA-C [facility] Pager: [phone] 03/17/2020,1:39 PM  LOS: 6 days

## 2020-03-18 LAB — CBC WITH DIFFERENTIAL/PLATELET
Abs Immature Granulocytes: 0.24 10*3/uL — ABNORMAL HIGH (ref 0.00–0.07)
Basophils Absolute: 0 10*3/uL (ref 0.0–0.1)
Basophils Relative: 0 %
Eosinophils Absolute: 0 10*3/uL (ref 0.0–0.5)
Eosinophils Relative: 0 %
HCT: 26.1 % — ABNORMAL LOW (ref 39.0–52.0)
Hemoglobin: 8.2 g/dL — ABNORMAL LOW (ref 13.0–17.0)
Immature Granulocytes: 1 %
Lymphocytes Relative: 4 %
Lymphs Abs: 0.7 10*3/uL (ref 0.7–4.0)
MCH: 31.7 pg (ref 26.0–34.0)
MCHC: 31.4 g/dL (ref 30.0–36.0)
MCV: 100.8 fL — ABNORMAL HIGH (ref 80.0–100.0)
Monocytes Absolute: 2.2 10*3/uL — ABNORMAL HIGH (ref 0.1–1.0)
Monocytes Relative: 13 %
Neutro Abs: 14 10*3/uL — ABNORMAL HIGH (ref 1.7–7.7)
Neutrophils Relative %: 82 %
Platelets: 286 10*3/uL (ref 150–400)
RBC: 2.59 MIL/uL — ABNORMAL LOW (ref 4.22–5.81)
RDW: 19.8 % — ABNORMAL HIGH (ref 11.5–15.5)
WBC: 17.2 10*3/uL — ABNORMAL HIGH (ref 4.0–10.5)
nRBC: 0.3 % — ABNORMAL HIGH (ref 0.0–0.2)

## 2020-03-18 LAB — COMPREHENSIVE METABOLIC PANEL
ALT: 119 U/L — ABNORMAL HIGH (ref 0–44)
AST: 142 U/L — ABNORMAL HIGH (ref 15–41)
Albumin: 3.2 g/dL — ABNORMAL LOW (ref 3.5–5.0)
Alkaline Phosphatase: 61 U/L (ref 38–126)
Anion gap: 9 (ref 5–15)
BUN: 30 mg/dL — ABNORMAL HIGH (ref 8–23)
CO2: 27 mmol/L (ref 22–32)
Calcium: 8.6 mg/dL — ABNORMAL LOW (ref 8.9–10.3)
Chloride: 102 mmol/L (ref 98–111)
Creatinine, Ser: 3 mg/dL — ABNORMAL HIGH (ref 0.61–1.24)
GFR calc Af Amer: 21 mL/min — ABNORMAL LOW (ref >60)
GFR calc non Af Amer: 18 mL/min — ABNORMAL LOW (ref >60)
Glucose, Bld: 105 mg/dL — ABNORMAL HIGH (ref 70–99)
Potassium: 3.6 mmol/L (ref 3.5–5.1)
Sodium: 138 mmol/L (ref 135–145)
Total Bilirubin: 0.8 mg/dL (ref 0.3–1.2)
Total Protein: 5.9 g/dL — ABNORMAL LOW (ref 6.5–8.1)

## 2020-03-18 LAB — RENAL FUNCTION PANEL
Albumin: 3 g/dL — ABNORMAL LOW (ref 3.5–5.0)
Anion gap: 15 (ref 5–15)
BUN: 70 mg/dL — ABNORMAL HIGH (ref 8–23)
CO2: 25 mmol/L (ref 22–32)
Calcium: 8.7 mg/dL — ABNORMAL LOW (ref 8.9–10.3)
Chloride: 94 mmol/L — ABNORMAL LOW (ref 98–111)
Creatinine, Ser: 6.03 mg/dL — ABNORMAL HIGH (ref 0.61–1.24)
GFR calc Af Amer: 9 mL/min — ABNORMAL LOW (ref >60)
GFR calc non Af Amer: 8 mL/min — ABNORMAL LOW (ref >60)
Glucose, Bld: 126 mg/dL — ABNORMAL HIGH (ref 70–99)
Phosphorus: 4.6 mg/dL (ref 2.5–4.6)
Potassium: 3.6 mmol/L (ref 3.5–5.1)
Sodium: 134 mmol/L — ABNORMAL LOW (ref 135–145)

## 2020-03-18 LAB — BRAIN NATRIURETIC PEPTIDE: B Natriuretic Peptide: 1141.3 pg/mL — ABNORMAL HIGH (ref 0.0–100.0)

## 2020-03-18 LAB — GLUCOSE, CAPILLARY
Glucose-Capillary: 104 mg/dL — ABNORMAL HIGH (ref 70–99)
Glucose-Capillary: 106 mg/dL — ABNORMAL HIGH (ref 70–99)
Glucose-Capillary: 113 mg/dL — ABNORMAL HIGH (ref 70–99)
Glucose-Capillary: 116 mg/dL — ABNORMAL HIGH (ref 70–99)
Glucose-Capillary: 153 mg/dL — ABNORMAL HIGH (ref 70–99)
Glucose-Capillary: 56 mg/dL — ABNORMAL LOW (ref 70–99)
Glucose-Capillary: 80 mg/dL (ref 70–99)

## 2020-03-18 LAB — MAGNESIUM: Magnesium: 1.9 mg/dL (ref 1.7–2.4)

## 2020-03-18 MED ORDER — LIDOCAINE HCL (PF) 1 % IJ SOLN: 5.0000 mL | INTRAMUSCULAR | Status: DC | PRN

## 2020-03-18 MED ORDER — PENTAFLUOROPROP-TETRAFLUOROETH EX AERO: 1.0000 "application " | INHALATION_SPRAY | CUTANEOUS | Status: DC | PRN

## 2020-03-18 MED ORDER — LIDOCAINE-PRILOCAINE 2.5-2.5 % EX CREA: 1.0000 "application " | TOPICAL_CREAM | CUTANEOUS | Status: DC | PRN

## 2020-03-18 MED ORDER — SODIUM CHLORIDE 0.9 % IV SOLN: 100.0000 mL | INTRAVENOUS | Status: DC | PRN

## 2020-03-18 MED ORDER — DEXTROSE 50 % IV SOLN
INTRAVENOUS | Status: AC
  Administered 2020-03-18: 50 mL
  Filled 2020-03-18: qty 50

## 2020-03-18 MED ORDER — HEPARIN SODIUM (PORCINE) 1000 UNIT/ML DIALYSIS: 1000.0000 [IU] | INTRAMUSCULAR | Status: DC | PRN

## 2020-03-18 MED ORDER — ALTEPLASE 2 MG IJ SOLR: 2.0000 mg | Freq: Once | INTRAMUSCULAR | Status: DC | PRN

## 2020-03-18 MED ORDER — DARBEPOETIN ALFA 100 MCG/0.5ML IJ SOSY
100.0000 ug | PREFILLED_SYRINGE | INTRAMUSCULAR | Status: DC
  Filled 2020-03-18: qty 0.5

## 2020-03-18 MED ORDER — PREDNISONE 50 MG PO TABS
50.0000 mg | ORAL_TABLET | Freq: Two times a day (BID) | ORAL | Status: DC
  Administered 2020-03-18 – 2020-03-22 (×10): 50 mg via ORAL
  Filled 2020-03-18 (×10): qty 1

## 2020-03-18 NOTE — Plan of Care (Signed)
  Problem: Safety: Goal: Ability to remain free from injury will improve Outcome: Progressing   

## 2020-03-18 NOTE — Progress Notes (Signed)
Physical Therapy Treatment Patient Details Name: Gabriel Cessna Sieben Sr. MRN: [id number] DOB: [birth year] Today's Date: 03/18/2020    History of Present Illness Pt is an 86 y/o male with PMH of ESRD on HD MWF, HLD, anemia of chronic disease, orthostatic hypotension, depression who was found to be confused at HD and sent to the hospital for further evaluation. Pt found to be hypoglycemic and with acute metabolic encephalopathy. Of note, pt with recent admission (5/1-5/4) secondary to falls with a SDH.    PT Comments    Patient received with NT in room cleaning patient after large loose stool. NT reports this is the third one this morning. Patient requires mod assist to maintain side lying position due to pain on bottom with cleaning. Requires mod assist to get onto side with cues. Patient did not attempt out of bed activity this date due to frequent bouts of diarrhea. Patient performed B LE exercises with min assist and multimodal cues  He will continue to benefit from skilled PT while here to improve strength and functional independence.     Follow Up Recommendations  SNF;Supervision/Assistance - 24 hour     Equipment Recommendations  None recommended by PT    Recommendations for Other Services       Precautions / Restrictions Precautions Precautions: Fall Restrictions Weight Bearing Restrictions: No    Mobility  Bed Mobility Overal bed mobility: Needs Assistance Bed Mobility: Rolling Rolling: Mod assist;+2 for physical assistance         General bed mobility comments: patient rolled with +2 mod assist for hygiene/cleaning up after large loose BM in bed.  Transfers                    Ambulation/Gait             General Gait Details: patient did not perform transfers or ambulation this date due to large loose stools this morning. NT states she has cleaned him up 3x this morning and asks that I not get him up to recliner.   Stairs             Wheelchair  Mobility    Modified Rankin (Stroke Patients Only)       Balance Overall balance assessment: Needs assistance;History of Falls                                          Cognition Arousal/Alertness: Awake/alert Behavior During Therapy: WFL for tasks assessed/performed Overall Cognitive Status: No family/caregiver present to determine baseline cognitive functioning Area of Impairment: Orientation;Following commands;Problem solving                 Orientation Level: Disoriented to;Place;Time;Situation     Following Commands: Follows one step commands inconsistently     Problem Solving: Slow processing;Difficulty sequencing;Requires verbal cues;Requires tactile cues General Comments: hx of dementia per chart      Exercises Other Exercises Other Exercises: B LE exercises AAROm to include: ap, heel slides, hip abd/add, SLR x 10 reps each Right LE weaker than left LE    General Comments        Pertinent Vitals/Pain Pain Assessment: Faces Faces Pain Scale: Hurts even more Pain Location: Bottom during cleaning Pain Descriptors / Indicators: Sore;Guarding;Grimacing;Moaning Pain Intervention(s): Monitored during session;Repositioned    Home Living  Prior Function            PT Goals (current goals can now be found in the care plan section) Acute Rehab PT Goals Patient Stated Goal: none stated PT Goal Formulation: Patient unable to participate in goal setting Time For Goal Achievement: 03/26/20 Progress towards PT goals: Progressing toward goals    Frequency    Min 2X/week      PT Plan Current plan remains appropriate    Co-evaluation              AM-PAC PT "6 Clicks" Mobility   Outcome Measure  Help needed turning from your back to your side while in a flat bed without using bedrails?: A Lot Help needed moving from lying on your back to sitting on the side of a flat bed without using bedrails?: A  Lot Help needed moving to and from a bed to a chair (including a wheelchair)?: A Lot Help needed standing up from a chair using your arms (e.g., wheelchair or bedside chair)?: A Lot Help needed to walk in hospital room?: A Lot Help needed climbing 3-5 steps with a railing? : Total 6 Click Score: 11    End of Session   Activity Tolerance: Patient tolerated treatment well;Patient limited by fatigue Patient left: in bed;with bed alarm set;with call bell/phone within reach Nurse Communication: Mobility status PT Visit Diagnosis: Other abnormalities of gait and mobility (R26.89);Muscle weakness (generalized) (M62.81);Repeated falls (R29.6)     Time: 5852-7782 PT Time Calculation (min) (ACUTE ONLY): 27 min  Charges:  $Therapeutic Exercise: 8-22 mins $Therapeutic Activity: 8-22 mins                     Tamira Ryland, PT, GCS 03/18/20,12:30 PM

## 2020-03-18 NOTE — Progress Notes (Signed)
[facility] Progress Note   Subjective:   Patient seen and examined at bedside.  No new complaints.   Objective Vitals:   03/18/20 0430 03/18/20 0500 03/18/20 0540 03/18/20 0605  BP: (!) 91/38 (!) 80/55 (!) 113/50 (!) 136/51  Pulse: 84 71 85 (!) 58  Resp:    20  Temp:   (!) 97.4 F (36.3 C) 98.2 F (36.8 C)  TempSrc:   Oral Oral  SpO2:    100%  Weight:   66.7 kg   Height:       Physical Exam General:chronically ill appearing, frail male in NAD Heart:RRR Lungs:+CTAB anteriorly  Abdomen:soft, NTND Extremities:no LE edema Dialysis Access: [facility]   Filed Weights   03/15/20 2039 03/18/20 0125 03/18/20 0540  Weight: 68.9 kg 66.7 kg 66.7 kg    Intake/Output Summary (Last 24 hours) at 03/18/2020 1135 Last data filed at 03/18/2020 1000 Gross per 24 hour  Intake 1000 ml  Output -131 ml  Net 1131 ml    Additional Objective Labs: Basic Metabolic Panel: Recent Labs  Lab 03/14/20 0811 03/15/20 0800 03/17/20 1048 03/17/20 1600 03/18/20 0140 03/18/20 1004  NA 136   < > 133*  --  134* 138  K 4.1   < > 3.8  --  3.6 3.6  CL 95*   < > 96*  --  94* 102  CO2 25   < > 20*  --  25 27  GLUCOSE 88   < > 143*  --  126* 105*  BUN 34*   < > 57*  --  70* 30*  CREATININE 4.92*   < > 6.33*  --  6.03* 3.00*  CALCIUM 10.5*   < > 9.5  --  8.7* 8.6*  PHOS 2.5  --   --  4.0 4.6  --    < > = values in this interval not displayed.   Liver Function Tests: Recent Labs  Lab 03/16/20 0549 03/16/20 0549 03/17/20 1048 03/18/20 0140 03/18/20 1004  AST 106*  --  92*  --  142*  ALT 70*  --  68*  --  119*  ALKPHOS 85  --  77  --  61  BILITOT 0.8  --  0.6  --  0.8  PROT 6.9  --  6.6  --  5.9*  ALBUMIN 3.5   < > 3.5 3.0* 3.2*   < > = values in this interval not displayed.   No results for input(s): LIPASE, AMYLASE in the last 168 hours. CBC: Recent Labs  Lab 03/14/20 0811 03/14/20 0811 03/15/20 0800 03/15/20 0800 03/16/20 0549 03/17/20 1048 03/18/20 1004  WBC 16.0*    < > 15.7*   < > 14.5* 19.4* 17.2*  NEUTROABS  --   --  12.4*   < > 12.7* 16.8* 14.0*  HGB 11.6*   < > 9.2*   < > 11.0* 10.4* 8.2*  HCT 37.7*   < > 30.0*   < > 35.3* 33.6* 26.1*  MCV 101.3*  --  101.7*  --  101.7* 101.2* 100.8*  PLT 280   < > 292   < > 308 356 286   < > = values in this interval not displayed.   Blood Culture    Component Value Date/Time   SDES PLEURAL FLUID RIGHT 03/13/2020 1644   SPECREQUEST Normal 03/13/2020 1644   CULT  03/13/2020 1644    NO GROWTH 3 DAYS Performed at [facility], [place] [address],  [place], [place] [address]    REPTSTATUS 03/16/2020 FINAL 03/13/2020 1644    Cardiac Enzymes: No results for input(s): CKTOTAL, CKMB, CKMBINDEX, TROPONINI in the last 168 hours. CBG: Recent Labs  Lab 03/17/20 2337 03/18/20 0350 03/18/20 0610 03/18/20 0707 03/18/20 1108  GLUCAP 98 116* 56* 104* 80   Iron Studies: No results for input(s): IRON, TIBC, TRANSFERRIN, FERRITIN in the last 72 hours. Lab Results  Component Value Date   INR 1.1 11/27/2019   INR 1.24 10/05/2016   INR 1.36 12/06/2015   Studies/Results: No results found.  Medications: . sodium chloride    . sodium chloride     . allopurinol  100 mg Oral Daily  . aspirin EC  81 mg Oral Daily  . calcitRIOL  1 mcg Oral Q M,W,F-HD  . carvedilol  3.125 mg Oral BID WC  . Chlorhexidine Gluconate Cloth  6 each Topical Q0600  . cinacalcet  30 mg Oral Q M,W,F-HD  . citalopram  10 mg Oral Daily  . darbepoetin (ARANESP) injection - DIALYSIS  60 mcg Intravenous Q Thu-HD  . feeding supplement (NEPRO CARB STEADY)  237 mL Oral QHS  . feeding supplement (PRO-STAT SUGAR FREE 64)  30 mL Oral BID  . BioGaia Probiotic  5 drop Oral Q2000  . latanoprost  1 drop Both Eyes QHS  . liver oil-zinc oxide   Topical BID  . midodrine  10 mg Oral TID  . multivitamin  1 tablet Oral QHS  . predniSONE  50 mg Oral BID WC  . rosuvastatin  20 mg Oral QHS    Dialysis Orders: [facility]- MWF 4 hours, 400 BFR via TDC  EDW 66- 2 K, 2 calc bath. No heparin  Calcitriol 1.5 mcg and sensipar 30 q HD, mircera 30  Assessment/Plan: 1.Confusion- waxing and waning, baseline dementia, hypoglycemic on arrival, +UTI, recent SDH. Rocephin given.  2.Hypoglycemia- per primary-->CT abd 5/10 with no obvious pancreatic lesion. pending ACTH, C-peptide, proinsulin, sulfonylureas. ACTH stimulation test and TSH stable. On PO prednisone and to f/u as OP w/Endocrine.  3. Bilateral pleural effusions - s/p thoracentesis. CT abd showed no reoccurrence.  4. ESRD-on HD MWF. No Heparin.Orders written for HD tomorrow per regular schedule. K 3.6, use 4K bath.  5. Anemiaof CKD-Hgb drop 8.2, no bleeding reported. ESA ordered with HD today ^dose 164mcg.   6. Secondary hyperparathyroidism- Donzetta Matters goal withdecrease VDRA.Phosimproved to 2.5s/p IV repletion.  Low d/t poor intake. Phos 4 on recheck. Hold binders.Continue sensipar 7.HTN/volume- Blood pressuredrop during HD. Does not appear volume overloaded.Per weights this AM close to EDW.  Minimal UF with HD.   8. Nutrition- Renal diet w/fluid restrictions.  Jen Mow, PA-C [facility] Pager: [phone] 03/18/2020,11:35 AM  LOS: 7 days

## 2020-03-18 NOTE — Plan of Care (Signed)
  Problem: Skin Integrity: Goal: Risk for impaired skin integrity will decrease Outcome: Progressing   

## 2020-03-18 NOTE — Progress Notes (Signed)
  Speech Language Pathology Treatment: Dysphagia  Patient Details Name: Gabriel Kumari Lalley Sr. MRN: [id number] DOB: 1929-10-18 Today's Date: 03/18/2020 Time: 0912-0920 SLP Time Calculation (min) (ACUTE ONLY): 8 min  Assessment / Plan / Recommendation Clinical Impression  Per RN pt has been tolerating puree texture and thin liquids, eating about 50% of most meals. No signs of aspiration observed, but pt did have hiccups on arrival and some sounds of wet reflux. Repositioned and posted sign for upright position after meals. Pt is still not really masticating solids, only pocketing or letting them melt. Recommend pt continue puree diet with thin liquids. Will sign off acutely.   HPI HPI: Gabriel Cyr Lastra Sr. is a 86 y.o. male with medical history significant of ESRD, stroke, prostate cancer, anemia of chronic disease, HLD, orthostatic hypotension, depression, and gout presented to the hospital with confusion. Per chart recent hospitalization 5/1-5/4 after presenting with multiple falls found to have a right sided subdural hematoma without midline shift or mass-effect. Found to be hypoglycemic, ongoing UTI. CT scan and redemonstrated a right subdural hematoma which appeared to be minimally decreased in size. CXR Mild bibasilar opacities are noted concerning for atelectasis or edema with associated pleural effusions. BSE 03/08/20; missing lower dentures, no overt s/s aspiration      SLP Plan  All goals met       Recommendations  Diet recommendations: Dysphagia 1 (puree);Thin liquid Liquids provided via: Cup;Straw Medication Administration: Crushed with puree Supervision: Staff to assist with self feeding;Intermittent supervision to cue for compensatory strategies Compensations: Slow rate;Small sips/bites;Minimize environmental distractions;Follow solids with liquid Postural Changes and/or Swallow Maneuvers: Upright 30-60 min after meal;Seated upright 90 degrees                Oral Care  Recommendations: Oral care BID Follow up Recommendations: 24 hour supervision/assistance SLP Visit Diagnosis: Dysphagia, unspecified (R13.10) Plan: All goals met       GO                Madylin Fairbank, Katherene Ponto 03/18/2020, 9:41 AM

## 2020-03-18 NOTE — Progress Notes (Signed)
PROGRESS NOTE        PATIENT DETAILS Name: Gabriel Francisco Vana Sr. Age: 86 y.o. Sex: male Date of Birth: 07-20-1929 Admit Date: 03/10/2020 Admitting Physician Evalee Mutton Kristeen Mans, MD GPQ:DIYM, Hunt Oris, MD  Brief Narrative:  Patient is a 86 y.o. male with history of ESRD on HD MWF, HLD, anemia of chronic disease, orthostatic hypotension, depression/dementia who was found to be confused (more than usual) at HD and sent to the hospital for further evaluation.  See below for further details.  Significant events:  5/5>> readmit to [facility] for evaluation of encephalopathy/hypoglycemia. 5/1-5/4>> admit to [facility]-frequent falls-found to have SDH, left hip hematoma, acute blood loss anemia requiring 2 units of PRBC  Antimicrobial therapy:  Rocephin: 5/5>>5/8  Microbiology data: None  Procedures :  5/8 >>  Right-sided ultrasound-guided thoracentesis by pulmonary critical care, transudative fluid  5/8>> CT chest: Slight enlargement of bilateral pleural effusions, compressive atelectasis bilateral lower lobes 5/8>> CT head: Minimal decrease in SDH  Consults:  Nephrology PCCM  DVT Prophylaxis :  SCD's  Subjective:  Patient in bed, appears comfortable, denies any headache, no fever, no chest pain or pressure, no shortness of breath , no abdominal pain. No focal weakness.  Assessment/Plan:  Recurrent hypoglycemia: Continues to recur-etiology uncertain -  suspect that this is from frailty/poor oral intake/underlying ESRD.  But given recurrent hypoglycemia (was also occurring last admission) - pending  ACTH, C-peptide, proinsulin, sulfonylureas level. Stable A1c along with stable TSH, stable ACTH stimulation test.  C-peptide was quite high  although he was on low-dose D5 drip at that time however it still quite significant, stable CT abdomen pelvis with and without contrast to rules out an obvious pancreatic lesion, discussed with endocrinologist Dr. [name] on  03/15/2020, have increased & oral prednisone further on 03/18/2020 and switched to BID, will require outpatient endocrine follow-up with Dr. [name] within 3 to 4 days of discharge.  He does seem to have inappropriately high C-peptide.   Recent Labs    03/17/20 2337 03/18/20 0610 03/18/20 0707  GLUCAP 98 56* 104*    Acute metabolic encephalopathy: Does have underlying dementia-but mental status has improved compared to on admission.  Suspect etiology of encephalopathy to be a combination of hypoglycemia, recent SDH/ongoing UTI/infection.  CT head on admission showed decreased size of the SDH.  Continue supportive care.    Complicated UTI/ ?Asp PNA with persistent bilateral pleural effusion (right>>left): Was adequately treated with antibiotics, underwent diagnostic right sided thoracentesis showing transudative fluid suggestive of volume overload due to missed dialysis.  Clinically much improved.  Speech following for underlying mild dysphagia.  Currently no signs of active infection.  ESRD on HD MWF: Nephrology following and directing care  Recent SDH: Following a mechanical fall (last admission)-repeat CT head this admission shows decreased size of SDH.  Recent left hip hematoma: Following a mechanical fall (last admission)-supportive care.  Hemoglobin currently stable-Did require PRBC transfusion last admission.  Anemia: Related to ESRD-hemoglobin stable with no need for transfusion.  Orthostatic hypotension: Continue midodrine-follow.  Depression: Continue Celexa  History of dementia: Stable-encephalopathy improving-suspect mentation not far from usual baseline.  Gout: No flare-continue allopurinol    Diet:  Diet Order            DIET - DYS 1 Room service appropriate? No; Fluid consistency: Thin  Diet effective now  Code Status: Full code  Family Communication:  Patient's son Dugan [phone]) on 03/14/20, 03/15/20, called her son on 03/17/2020 at  10:26 AM, full voicemail, updated 03/18/20  Disposition Plan: Status is: Inpatient  Remains inpatient appropriate because:Altered mental status  Dispo: The patient is from: Home              Anticipated d/c is to: SNF              Anticipated d/c date is: As soon as SNF bed available              Patient currently is medically stable to d/c.   Barriers to Discharge: Hypoglycemia/ongoing encephalopathy/recurrent pleural effusion   Antimicrobial agents: Anti-infectives (From admission, onward)   Start     Dose/Rate Route Frequency Ordered Stop   03/10/20 2200  amoxicillin-clavulanate (AUGMENTIN) 500-125 MG per tablet 500 mg  Status:  Discontinued     500 mg Oral Daily at bedtime 03/10/20 1500 03/10/20 1752   03/10/20 1800  cefTRIAXone (ROCEPHIN) 2 g in sodium chloride 0.9 % 100 mL IVPB  Status:  Discontinued     2 g 200 mL/hr over 30 Minutes Intravenous Every 24 hours 03/10/20 1752 03/13/20 1421       Time spent:  25 minutes-Greater than 50% of this time was spent in counseling, explanation of diagnosis, planning of further management, and coordination of care.  MEDICATIONS:  Scheduled Meds:  . allopurinol  100 mg Oral Daily  . aspirin EC  81 mg Oral Daily  . calcitRIOL  1 mcg Oral Q M,W,F-HD  . carvedilol  3.125 mg Oral BID WC  . Chlorhexidine Gluconate Cloth  6 each Topical Q0600  . cinacalcet  30 mg Oral Q M,W,F-HD  . citalopram  10 mg Oral Daily  . darbepoetin (ARANESP) injection - DIALYSIS  60 mcg Intravenous Q Thu-HD  . feeding supplement (NEPRO CARB STEADY)  237 mL Oral QHS  . feeding supplement (PRO-STAT SUGAR FREE 64)  30 mL Oral BID  . BioGaia Probiotic  5 drop Oral Q2000  . latanoprost  1 drop Both Eyes QHS  . liver oil-zinc oxide   Topical BID  . midodrine  10 mg Oral TID  . multivitamin  1 tablet Oral QHS  . predniSONE  50 mg Oral BID WC  . rosuvastatin  20 mg Oral QHS   Continuous Infusions: . sodium chloride    . sodium chloride     PRN  Meds:.sodium chloride, sodium chloride, acetaminophen **OR** [DISCONTINUED] acetaminophen, albuterol, alteplase, heparin, lidocaine (PF), lidocaine-prilocaine, nystatin, pentafluoroprop-tetrafluoroeth, sodium chloride flush   PHYSICAL EXAM: Vital signs: Vitals:   03/18/20 0430 03/18/20 0500 03/18/20 0540 03/18/20 0605  BP: (!) 91/38 (!) 80/55 (!) 113/50 (!) 136/51  Pulse: 84 71 85 (!) 58  Resp:    20  Temp:   (!) 97.4 F (36.3 C) 98.2 F (36.8 C)  TempSrc:   Oral Oral  SpO2:    100%  Weight:   66.7 kg   Height:       Filed Weights   03/15/20 2039 03/18/20 0125 03/18/20 0540  Weight: 68.9 kg 66.7 kg 66.7 kg   Body mass index is 21.71 kg/m.   EXAM  Awake , mildly confused, No new F.N deficits, Normal affect [place].AT,PERRAL Supple Neck,No JVD, No cervical lymphadenopathy appriciated.  Symmetrical Chest wall movement, Good air movement bilaterally, CTAB RRR,No Gallops, Rubs or new Murmurs, No Parasternal Heave +ve B.Sounds, Abd Soft, No tenderness, No organomegaly appriciated, No  rebound - guarding or rigidity. No Cyanosis, Clubbing or edema, No new Rash or bruise    I have personally reviewed following labs and imaging studies  LABORATORY DATA:  CBC: Recent Labs  Lab 03/12/20 0815 03/14/20 0811 03/15/20 0800 03/16/20 0549 03/17/20 1048  WBC 9.7 16.0* 15.7* 14.5* 19.4*  NEUTROABS  --   --  12.4* 12.7* 16.8*  HGB 9.6* 11.6* 9.2* 11.0* 10.4*  HCT 30.3* 37.7* 30.0* 35.3* 33.6*  MCV 98.1 101.3* 101.7* 101.7* 101.2*  PLT 280 280 292 308 884    Basic Metabolic Panel: Recent Labs  Lab 03/12/20 0814 03/12/20 0814 03/13/20 0442 03/13/20 0442 03/14/20 0811 03/15/20 0800 03/16/20 0549 03/17/20 1048 03/17/20 1600 03/18/20 0140  NA 133*   < > 137   < > 136 135 136 133*  --  134*  K 3.3*   < > 3.8   < > 4.1 4.0 4.3 3.8  --  3.6  CL 97*   < > 96*   < > 95* 95* 96* 96*  --  94*  CO2 28   < > 28   < > 25 30 26  20*  --  25  GLUCOSE 99   < > 100*   < > 88 101* 105*  143*  --  126*  BUN 11   < > 16   < > 34* 49* 29* 57*  --  70*  CREATININE 4.34*   < > 3.17*   < > 4.92* 6.85* 4.00* 6.33*  --  6.03*  CALCIUM 8.7*   < > 9.5   < > 10.5* 9.8 9.3 9.5  --  8.7*  MG  --   --   --   --  2.4 2.6* 2.2 2.1  --   --   PHOS 1.9*  --  2.2*  --  2.5  --   --   --  4.0 4.6   < > = values in this interval not displayed.    GFR: Estimated Creatinine Clearance: 8 mL/min (A) (by C-G formula based on SCr of 6.03 mg/dL (H)).  Liver Function Tests: Recent Labs  Lab 03/12/20 0814 03/13/20 1715 03/14/20 0811 03/15/20 0800 03/16/20 0549 03/17/20 1048 03/18/20 0140  AST  --   --   --  47* 106* 92*  --   ALT  --   --   --  28 70* 68*  --   ALKPHOS  --   --   --  68 85 77  --   BILITOT  --   --   --  0.7 0.8 0.6  --   PROT  --  6.3* 7.2 5.9* 6.9 6.6  --   ALBUMIN   < >  --  3.5 3.0* 3.5 3.5 3.0*   < > = values in this interval not displayed.   No results for input(s): LIPASE, AMYLASE in the last 168 hours. No results for input(s): AMMONIA in the last 168 hours.  Coagulation Profile: No results for input(s): INR, PROTIME in the last 168 hours.  Cardiac Enzymes: No results for input(s): CKTOTAL, CKMB, CKMBINDEX, TROPONINI in the last 168 hours.  BNP (last 3 results) No results for input(s): PROBNP in the last 8760 hours.  Lipid Profile: No results for input(s): CHOL, HDL, LDLCALC, TRIG, CHOLHDL, LDLDIRECT in the last 72 hours.  Thyroid Function Tests: No results for input(s): TSH, T4TOTAL, FREET4, T3FREE, THYROIDAB in the last 72 hours.  Anemia Panel: No results for input(s): VITAMINB12,  FOLATE, FERRITIN, TIBC, IRON, RETICCTPCT in the last 72 hours.  Urine analysis:    Component Value Date/Time   COLORURINE YELLOW 03/07/2020 1128   APPEARANCEUR HAZY (A) 03/07/2020 1128   LABSPEC 1.008 03/07/2020 1128   PHURINE 9.0 (H) 03/07/2020 1128   GLUCOSEU NEGATIVE 03/07/2020 1128   GLUCOSEU NEGATIVE 01/30/2020 1518   HGBUR SMALL (A) 03/07/2020 1128    BILIRUBINUR NEGATIVE 03/07/2020 1128   KETONESUR NEGATIVE 03/07/2020 1128   PROTEINUR 100 (A) 03/07/2020 1128   UROBILINOGEN 0.2 01/30/2020 1518   NITRITE NEGATIVE 03/07/2020 1128   LEUKOCYTESUR LARGE (A) 03/07/2020 1128    Sepsis Labs: Lactic Acid, Venous    Component Value Date/Time   LATICACIDVEN 0.66 12/08/2016 0605    MICROBIOLOGY: Recent Results (from the past 240 hour(s))  SARS CORONAVIRUS 2 (TAT 6-24 HRS) Nasopharyngeal Nasopharyngeal Swab     Status: None   Collection Time: 03/10/20  1:20 PM   Specimen: Nasopharyngeal Swab  Result Value Ref Range Status   SARS Coronavirus 2 NEGATIVE NEGATIVE Final    Comment: (NOTE) SARS-CoV-2 target nucleic acids are NOT DETECTED. The SARS-CoV-2 RNA is generally detectable in upper and lower respiratory specimens during the acute phase of infection. Negative results do not preclude SARS-CoV-2 infection, do not rule out co-infections with other pathogens, and should not be used as the sole basis for treatment or other patient management decisions. Negative results must be combined with clinical observations, patient history, and epidemiological information. The expected result is Negative. Fact Sheet for Patients: SugarRoll.be Fact Sheet for Healthcare Providers: https://www.woods-mathews.com/ This test is not yet approved or cleared by the [place] FDA and  has been authorized for detection and/or diagnosis of SARS-CoV-2 by FDA under an Emergency Use Authorization (EUA). This EUA will remain  in effect (meaning this test can be used) for the duration of the COVID-19 declaration under Section 56 4(b)(1) of the Act, 21 U.S.C. section 360bbb-3(b)(1), unless the authorization is terminated or revoked sooner. Performed at [facility], [place] [address], [place], [place] [address]   MRSA PCR Screening     Status: None   Collection Time: 03/11/20  3:06 AM   Specimen: Nasal Mucosa;  Nasopharyngeal  Result Value Ref Range Status   MRSA by PCR NEGATIVE NEGATIVE Final    Comment:        The GeneXpert MRSA Assay (FDA approved for NASAL specimens only), is one component of a comprehensive MRSA colonization surveillance program. It is not intended to diagnose MRSA infection nor to guide or monitor treatment for MRSA infections. Performed at [facility], [place] [address] [place], [place] [address]   Body fluid culture     Status: None   Collection Time: 03/13/20  4:44 PM   Specimen: Pleura; Body Fluid  Result Value Ref Range Status   Specimen Description PLEURAL FLUID RIGHT  Final   Special Requests Normal  Final   Gram Stain   Final    CYTOSPIN SMEAR WBC PRESENT, PREDOMINANTLY MONONUCLEAR NO ORGANISMS SEEN    Culture   Final    NO GROWTH 3 DAYS Performed at [facility], [address], [place], [place] [address]    Report Status 03/16/2020 FINAL  Final    RADIOLOGY STUDIES/RESULTS: No results found.   LOS: 7 days   Signature  Lala Lund M.D on 03/18/2020 at 10:07 AM   -  To page go to [email or web address]

## 2020-03-18 NOTE — Consult Note (Signed)
[place] Nurse Consult Note: Reason for Consult:Recent readmit for confusion, falls, recent UTI.  Has partial thickness skin breakdown to left gluteal. Moisture and decreased bed mobility Wound type:stage 2 pressure injury to left gluteal Pressure Injury POA: Yes this admission Measurement:2 cm x 1 cm x 0.1 cm  Wound JOI:TGPQ and moist Drainage (amount, consistency, odor) minimal serosanguinous  No odor. Periwound:intact Dressing procedure/placement/frequency: Cleanse buttocks with soap and water and pat dry  Apply Gerhardts butt paste twice daily and PRN soilage.  Will not follow at this time.  Please re-consult if needed.  Domenic Moras MSN, RN, FNP-BC CWON Wound, Ostomy, Continence Nurse Pager [phone]

## 2020-03-18 NOTE — Plan of Care (Signed)
°  Problem: Activity: °Goal: Risk for activity intolerance will decrease °Outcome: Progressing °  °Problem: Nutrition: °Goal: Adequate nutrition will be maintained °Outcome: Progressing °  °Problem: Skin Integrity: °Goal: Risk for impaired skin integrity will decrease °Outcome: Progressing °  °

## 2020-03-18 NOTE — TOC Progression Note (Signed)
Transition of Care (TOC) - Progression Note  *[facility] chosen by son for [facility]   Patient Details  Name: Gabriel Sevigny Minerva Sr. MRN: [id number] Date of Birth: [birth year]  Transition of Care [facility]) CM/SW Contact  Sharlet Salina Mila Homer, LCSW Phone Number: 03/18/2020, 5:03 PM  Clinical Narrative: Talked with son, [name] by phone [phone]) regarding facility selection for his dad and he chose [facility]. Talked with son regarding how Medicare A&B insurance pays for therapy and his questions answered. Call made to admissions director Irine Seal (4:56 pm) and message left. CSW will f/u with admissions director on Friday, 5/14.with      Expected Discharge Plan: [place] Barriers to Discharge: Continued Medical Work up  Expected Discharge Plan and Services Expected Discharge Plan: [place] In-house Referral: Clinical Social Work   Post Acute Care Choice: [place] Living arrangements for the past 2 months: Single Family Home                                     Social Determinants of Health (SDOH) Interventions  No SDOH interventions requested or needed at this time.  Readmission Risk Interventions No flowsheet data found.

## 2020-03-19 LAB — GLUCOSE, CAPILLARY
Glucose-Capillary: 102 mg/dL — ABNORMAL HIGH (ref 70–99)
Glucose-Capillary: 108 mg/dL — ABNORMAL HIGH (ref 70–99)
Glucose-Capillary: 129 mg/dL — ABNORMAL HIGH (ref 70–99)
Glucose-Capillary: 144 mg/dL — ABNORMAL HIGH (ref 70–99)
Glucose-Capillary: 80 mg/dL (ref 70–99)

## 2020-03-19 LAB — CBC
HCT: 22.3 % — ABNORMAL LOW (ref 39.0–52.0)
HCT: 23.4 % — ABNORMAL LOW (ref 39.0–52.0)
Hemoglobin: 7.2 g/dL — ABNORMAL LOW (ref 13.0–17.0)
Hemoglobin: 7.5 g/dL — ABNORMAL LOW (ref 13.0–17.0)
MCH: 32.4 pg (ref 26.0–34.0)
MCH: 32.3 pg (ref 26.0–34.0)
MCHC: 32.3 g/dL (ref 30.0–36.0)
MCHC: 32.1 g/dL (ref 30.0–36.0)
MCV: 100.5 fL — ABNORMAL HIGH (ref 80.0–100.0)
MCV: 100.9 fL — ABNORMAL HIGH (ref 80.0–100.0)
Platelets: 278 10*3/uL (ref 150–400)
Platelets: 275 10*3/uL (ref 150–400)
RBC: 2.22 MIL/uL — ABNORMAL LOW (ref 4.22–5.81)
RBC: 2.32 MIL/uL — ABNORMAL LOW (ref 4.22–5.81)
RDW: 20 % — ABNORMAL HIGH (ref 11.5–15.5)
RDW: 20.2 % — ABNORMAL HIGH (ref 11.5–15.5)
WBC: 16.4 10*3/uL — ABNORMAL HIGH (ref 4.0–10.5)
WBC: 16.6 10*3/uL — ABNORMAL HIGH (ref 4.0–10.5)
nRBC: 0.2 % (ref 0.0–0.2)
nRBC: 0.5 % — ABNORMAL HIGH (ref 0.0–0.2)

## 2020-03-19 LAB — RENAL FUNCTION PANEL
Albumin: 3.2 g/dL — ABNORMAL LOW (ref 3.5–5.0)
Anion gap: 11 (ref 5–15)
BUN: 9 mg/dL (ref 8–23)
CO2: 27 mmol/L (ref 22–32)
Calcium: 8.1 mg/dL — ABNORMAL LOW (ref 8.9–10.3)
Chloride: 98 mmol/L (ref 98–111)
Creatinine, Ser: 1.24 mg/dL (ref 0.61–1.24)
GFR calc Af Amer: 60 mL/min — ABNORMAL LOW (ref >60)
GFR calc non Af Amer: 52 mL/min — ABNORMAL LOW (ref >60)
Glucose, Bld: 101 mg/dL — ABNORMAL HIGH (ref 70–99)
Phosphorus: 1.5 mg/dL — ABNORMAL LOW (ref 2.5–4.6)
Potassium: 3.7 mmol/L (ref 3.5–5.1)
Sodium: 136 mmol/L (ref 135–145)

## 2020-03-19 LAB — SULFONYLUREA HYPOGLYCEMICS PANEL, SERUM
Acetohexamide: NEGATIVE ug/mL (ref 20–60)
Chlorpropamide: NEGATIVE ug/mL (ref 75–250)
Glimepiride: NEGATIVE ng/mL (ref 80–250)
Glipizide: NEGATIVE ng/mL (ref 200–1000)
Glyburide: NEGATIVE ng/mL
Nateglinide: NEGATIVE ng/mL
Repaglinide: NEGATIVE ng/mL
Tolazamide: NEGATIVE ug/mL
Tolbutamide: NEGATIVE ug/mL (ref 40–100)

## 2020-03-19 LAB — PREPARE RBC (CROSSMATCH)

## 2020-03-19 MED ORDER — SODIUM CHLORIDE 0.9 % IV SOLN: 100.0000 mL | INTRAVENOUS | Status: DC | PRN

## 2020-03-19 MED ORDER — LIDOCAINE-PRILOCAINE 2.5-2.5 % EX CREA: 1.0000 "application " | TOPICAL_CREAM | CUTANEOUS | Status: DC | PRN

## 2020-03-19 MED ORDER — ALTEPLASE 2 MG IJ SOLR: 2.0000 mg | Freq: Once | INTRAMUSCULAR | Status: DC | PRN

## 2020-03-19 MED ORDER — HEPARIN SODIUM (PORCINE) 1000 UNIT/ML IJ SOLN
INTRAMUSCULAR | Status: AC
  Filled 2020-03-19: qty 4

## 2020-03-19 MED ORDER — FERROUS SULFATE 325 (65 FE) MG PO TABS
325.0000 mg | ORAL_TABLET | Freq: Two times a day (BID) | ORAL | Status: DC
  Administered 2020-03-19 – 2020-03-22 (×6): 325 mg via ORAL
  Filled 2020-03-19 (×6): qty 1

## 2020-03-19 MED ORDER — LIDOCAINE HCL (PF) 1 % IJ SOLN: 5.0000 mL | INTRAMUSCULAR | Status: DC | PRN

## 2020-03-19 MED ORDER — SODIUM CHLORIDE 0.9 % IV SOLN
12.5000 mg | Freq: Three times a day (TID) | INTRAVENOUS | Status: DC | PRN
  Filled 2020-03-19: qty 0.5

## 2020-03-19 MED ORDER — PENTAFLUOROPROP-TETRAFLUOROETH EX AERO: 1.0000 "application " | INHALATION_SPRAY | CUTANEOUS | Status: DC | PRN

## 2020-03-19 MED ORDER — DARBEPOETIN ALFA 100 MCG/0.5ML IJ SOSY: 100.0000 ug | PREFILLED_SYRINGE | INTRAMUSCULAR | Status: DC

## 2020-03-19 MED ORDER — CALCITRIOL 0.5 MCG PO CAPS
ORAL_CAPSULE | ORAL | Status: AC
  Filled 2020-03-19: qty 2

## 2020-03-19 MED ORDER — DIPHENHYDRAMINE HCL 50 MG/ML IJ SOLN: 25.0000 mg | Freq: Four times a day (QID) | INTRAMUSCULAR | Status: DC | PRN

## 2020-03-19 MED ORDER — SODIUM CHLORIDE 0.9% IV SOLUTION: Freq: Once | INTRAVENOUS | Status: AC

## 2020-03-19 MED ORDER — HEPARIN SODIUM (PORCINE) 1000 UNIT/ML DIALYSIS: 1000.0000 [IU] | INTRAMUSCULAR | Status: DC | PRN

## 2020-03-19 MED ORDER — PANTOPRAZOLE SODIUM 40 MG PO TBEC
40.0000 mg | DELAYED_RELEASE_TABLET | Freq: Two times a day (BID) | ORAL | Status: DC
  Administered 2020-03-19 – 2020-03-22 (×7): 40 mg via ORAL
  Filled 2020-03-19 (×6): qty 1

## 2020-03-19 MED ORDER — DARBEPOETIN ALFA 100 MCG/0.5ML IJ SOSY
PREFILLED_SYRINGE | INTRAMUSCULAR | Status: AC
  Administered 2020-03-19: 100 ug via INTRAVENOUS
  Filled 2020-03-19: qty 0.5

## 2020-03-19 NOTE — Progress Notes (Addendum)
PROGRESS NOTE        PATIENT DETAILS Name: Gabriel Beckles Grotz Sr. Age: 86 y.o. Sex: male Date of Birth: February 01, 1929 Admit Date: 03/10/2020 Admitting Physician Evalee Mutton Kristeen Mans, MD LKT:GYBW, Hunt Oris, MD  Brief Narrative:  Patient is a 86 y.o. male with history of ESRD on HD MWF, HLD, anemia of chronic disease, orthostatic hypotension, depression/dementia who was found to be confused (more than usual) at HD and sent to the hospital for further evaluation.  See below for further details.  Significant events:  5/5>> readmit to [facility] for evaluation of encephalopathy/hypoglycemia. 5/1-5/4>> admit to [facility]-frequent falls-found to have SDH, left hip hematoma, acute blood loss anemia requiring 2 units of PRBC  Antimicrobial therapy:  Rocephin: 5/5>>5/8  Microbiology data: None  Procedures :  5/8 >>  Right-sided ultrasound-guided thoracentesis by pulmonary critical care, transudative fluid  5/8>> CT chest: Slight enlargement of bilateral pleural effusions, compressive atelectasis bilateral lower lobes 5/8>> CT head: Minimal decrease in SDH  Consults:  Nephrology PCCM  DVT Prophylaxis :  SCD's  Subjective:  Patient in bed undergoing dialysis in no distress, denies any headache chest or abdominal pain.  No shortness of breath.  Assessment/Plan:  Recurrent hypoglycemia: Continues to recur-etiology uncertain -  suspect that this is from frailty/poor oral intake/underlying ESRD.  But given recurrent hypoglycemia (was also occurring last admission) - pending  ACTH, C-peptide, proinsulin, sulfonylureas level. Stable A1c along with stable TSH, stable ACTH stimulation test.  C-peptide was quite high  although he was on low-dose D5 drip at that time however it still quite significant, stable CT abdomen pelvis with and without contrast to rules out an obvious pancreatic lesion, discussed with endocrinologist Dr. [name] on 03/15/2020, sugars have improved once he has  been placed on twice daily prednisone which will be continued, please make sure he is not n.p.o. for prolonged.  Of time and has breakfast early in the morning specially on the days when he is going for HD.   Recent Labs    03/18/20 2017 03/18/20 2350 03/19/20 0402  GLUCAP 153* 106* 102*    Acute metabolic encephalopathy: Does have underlying dementia-but mental status has improved compared to on admission.  Suspect etiology of encephalopathy to be a combination of hypoglycemia, recent SDH/ongoing UTI/infection.  CT head on admission showed decreased size of the SDH.  Continue supportive care.    Complicated UTI/ ?Asp PNA with persistent bilateral pleural effusion (right>>left): Was adequately treated with antibiotics, underwent diagnostic right sided thoracentesis showing transudative fluid suggestive of volume overload due to missed dialysis.  Clinically much improved.  Speech following for underlying mild dysphagia.  Currently no signs of active infection.  ESRD on HD MWF: Nephrology following and directing care  Recent SDH: Following a mechanical fall (last admission)-repeat CT head this admission shows decreased size of SDH.  Recent left hip hematoma: Following a mechanical fall (last admission)- supportive care.  Hemoglobin currently stable-Did require 2 Units of PRBC transfusion last admission (03/09/20).  AOCD: Related to ESRD-hemoglobin, worsened by frequent blood draws in the hospital on top of ESRD causing anemia of chronic disease and marrow suppression, placed on PPI, oral iron, 1 unit of packed RBC transfusion on 2020-03-19 as he is getting overall weak. Monitor H&H. Had detailed discussion with patient's son bedside poor candidate for invasive procedures. For now supportive care and monitor.  Orthostatic hypotension: Continue midodrine-follow.  Depression: Continue Celexa  History of dementia: Stable-encephalopathy improving-suspect mentation not far from usual  baseline.  Gout: No flare-continue allopurinol    Diet:  Diet Order            DIET - DYS 1 Room service appropriate? No; Fluid consistency: Thin  Diet effective now               Code Status: Full code  Family Communication:  Patient's son Winnie [phone]) on 03/14/20, 03/15/20, called her son on 03/17/2020 at 10:26 AM, full voicemail, updated again over the phone on 03/18/20  Disposition Plan: Status is: Inpatient  Remains inpatient appropriate because:Altered mental status  Dispo: The patient is from: Home              Anticipated d/c is to: SNF              Anticipated d/c date is: As soon as SNF bed available              Patient currently is medically stable to d/c.   Barriers to Discharge: Hypoglycemia/ongoing encephalopathy/recurrent pleural effusion   Antimicrobial agents: Anti-infectives (From admission, onward)   Start     Dose/Rate Route Frequency Ordered Stop   03/10/20 2200  amoxicillin-clavulanate (AUGMENTIN) 500-125 MG per tablet 500 mg  Status:  Discontinued     500 mg Oral Daily at bedtime 03/10/20 1500 03/10/20 1752   03/10/20 1800  cefTRIAXone (ROCEPHIN) 2 g in sodium chloride 0.9 % 100 mL IVPB  Status:  Discontinued     2 g 200 mL/hr over 30 Minutes Intravenous Every 24 hours 03/10/20 1752 03/13/20 1421       Time spent:  25 minutes-Greater than 50% of this time was spent in counseling, explanation of diagnosis, planning of further management, and coordination of care.  MEDICATIONS:  Scheduled Meds:  . calcitRIOL      . allopurinol  100 mg Oral Daily  . aspirin EC  81 mg Oral Daily  . calcitRIOL  1 mcg Oral Q M,W,F-HD  . carvedilol  3.125 mg Oral BID WC  . Chlorhexidine Gluconate Cloth  6 each Topical Q0600  . cinacalcet  30 mg Oral Q M,W,F-HD  . citalopram  10 mg Oral Daily  . darbepoetin (ARANESP) injection - DIALYSIS  100 mcg Intravenous Q Fri-HD  . feeding supplement (NEPRO CARB STEADY)  237 mL Oral QHS  . feeding supplement  (PRO-STAT SUGAR FREE 64)  30 mL Oral BID  . BioGaia Probiotic  5 drop Oral Q2000  . latanoprost  1 drop Both Eyes QHS  . liver oil-zinc oxide   Topical BID  . midodrine  10 mg Oral TID  . multivitamin  1 tablet Oral QHS  . predniSONE  50 mg Oral BID WC  . rosuvastatin  20 mg Oral QHS   Continuous Infusions: . sodium chloride    . sodium chloride    . sodium chloride    . sodium chloride     PRN Meds:.sodium chloride, sodium chloride, sodium chloride, sodium chloride, acetaminophen **OR** [DISCONTINUED] acetaminophen, albuterol, alteplase, alteplase, heparin, heparin, lidocaine (PF), lidocaine (PF), lidocaine-prilocaine, lidocaine-prilocaine, nystatin, pentafluoroprop-tetrafluoroeth, pentafluoroprop-tetrafluoroeth, sodium chloride flush   PHYSICAL EXAM: Vital signs: Vitals:   03/18/20 0605 03/18/20 0830 03/18/20 2023 03/19/20 0449  BP: (!) 136/51 (!) 132/55 (!) 164/78 133/61  Pulse: (!) 58 63 90 81  Resp: 20 18 20 12   Temp: 98.2 F (36.8 C) 98 F (36.7 C) 98.1 F (  36.7 C) 98 F (36.7 C)  TempSrc: Oral Oral  Oral  SpO2: 100% 98% 100% 91%  Weight:   70.5 kg   Height:       Filed Weights   03/18/20 0125 03/18/20 0540 03/18/20 2023  Weight: 66.7 kg 66.7 kg 70.5 kg   Body mass index is 22.95 kg/m.   EXAM  Awake Alert, No new F.N deficits,   [place].AT,PERRAL Supple Neck,No JVD, No cervical lymphadenopathy appriciated.  Symmetrical Chest wall movement, Good air movement bilaterally, CTAB RRR,No Gallops, Rubs or new Murmurs, No Parasternal Heave +ve B.Sounds, Abd Soft, No tenderness, No organomegaly appriciated, No rebound - guarding or rigidity. No Cyanosis, Clubbing or edema, No new Rash or bruise  I have personally reviewed following labs and imaging studies  LABORATORY DATA:  CBC: Recent Labs  Lab 03/14/20 0811 03/15/20 0800 03/16/20 0549 03/17/20 1048 03/18/20 1004  WBC 16.0* 15.7* 14.5* 19.4* 17.2*  NEUTROABS  --  12.4* 12.7* 16.8* 14.0*  HGB 11.6* 9.2*  11.0* 10.4* 8.2*  HCT 37.7* 30.0* 35.3* 33.6* 26.1*  MCV 101.3* 101.7* 101.7* 101.2* 100.8*  PLT 280 292 308 356 782    Basic Metabolic Panel: Recent Labs  Lab 03/13/20 0442 03/13/20 0442 03/14/20 0811 03/14/20 0811 03/15/20 0800 03/16/20 0549 03/17/20 1048 03/17/20 1600 03/18/20 0140 03/18/20 1004  NA 137   < > 136   < > 135 136 133*  --  134* 138  K 3.8   < > 4.1   < > 4.0 4.3 3.8  --  3.6 3.6  CL 96*   < > 95*   < > 95* 96* 96*  --  94* 102  CO2 28   < > 25   < > 30 26 20*  --  25 27  GLUCOSE 100*   < > 88   < > 101* 105* 143*  --  126* 105*  BUN 16   < > 34*   < > 49* 29* 57*  --  70* 30*  CREATININE 3.17*   < > 4.92*   < > 6.85* 4.00* 6.33*  --  6.03* 3.00*  CALCIUM 9.5   < > 10.5*   < > 9.8 9.3 9.5  --  8.7* 8.6*  MG  --   --  2.4  --  2.6* 2.2 2.1  --   --  1.9  PHOS 2.2*  --  2.5  --   --   --   --  4.0 4.6  --    < > = values in this interval not displayed.    GFR: Estimated Creatinine Clearance: 17 mL/min (A) (by C-G formula based on SCr of 3 mg/dL (H)).  Liver Function Tests: Recent Labs  Lab 03/14/20 0811 03/14/20 0811 03/15/20 0800 03/16/20 0549 03/17/20 1048 03/18/20 0140 03/18/20 1004  AST  --   --  47* 106* 92*  --  142*  ALT  --   --  28 70* 68*  --  119*  ALKPHOS  --   --  68 85 77  --  61  BILITOT  --   --  0.7 0.8 0.6  --  0.8  PROT 7.2  --  5.9* 6.9 6.6  --  5.9*  ALBUMIN 3.5   < > 3.0* 3.5 3.5 3.0* 3.2*   < > = values in this interval not displayed.   No results for input(s): LIPASE, AMYLASE in the last 168 hours. No results for input(s): AMMONIA  in the last 168 hours.  Coagulation Profile: No results for input(s): INR, PROTIME in the last 168 hours.  Cardiac Enzymes: No results for input(s): CKTOTAL, CKMB, CKMBINDEX, TROPONINI in the last 168 hours.  BNP (last 3 results) No results for input(s): PROBNP in the last 8760 hours.  Lipid Profile: No results for input(s): CHOL, HDL, LDLCALC, TRIG, CHOLHDL, LDLDIRECT in the last 72  hours.  Thyroid Function Tests: No results for input(s): TSH, T4TOTAL, FREET4, T3FREE, THYROIDAB in the last 72 hours.  Anemia Panel: No results for input(s): VITAMINB12, FOLATE, FERRITIN, TIBC, IRON, RETICCTPCT in the last 72 hours.  Urine analysis:    Component Value Date/Time   COLORURINE YELLOW 03/07/2020 1128   APPEARANCEUR HAZY (A) 03/07/2020 1128   LABSPEC 1.008 03/07/2020 1128   PHURINE 9.0 (H) 03/07/2020 1128   GLUCOSEU NEGATIVE 03/07/2020 1128   GLUCOSEU NEGATIVE 01/30/2020 1518   HGBUR SMALL (A) 03/07/2020 1128   BILIRUBINUR NEGATIVE 03/07/2020 1128   KETONESUR NEGATIVE 03/07/2020 1128   PROTEINUR 100 (A) 03/07/2020 1128   UROBILINOGEN 0.2 01/30/2020 1518   NITRITE NEGATIVE 03/07/2020 1128   LEUKOCYTESUR LARGE (A) 03/07/2020 1128    Sepsis Labs: Lactic Acid, Venous    Component Value Date/Time   LATICACIDVEN 0.66 12/08/2016 0605    MICROBIOLOGY: Recent Results (from the past 240 hour(s))  SARS CORONAVIRUS 2 (TAT 6-24 HRS) Nasopharyngeal Nasopharyngeal Swab     Status: None   Collection Time: 03/10/20  1:20 PM   Specimen: Nasopharyngeal Swab  Result Value Ref Range Status   SARS Coronavirus 2 NEGATIVE NEGATIVE Final    Comment: (NOTE) SARS-CoV-2 target nucleic acids are NOT DETECTED. The SARS-CoV-2 RNA is generally detectable in upper and lower respiratory specimens during the acute phase of infection. Negative results do not preclude SARS-CoV-2 infection, do not rule out co-infections with other pathogens, and should not be used as the sole basis for treatment or other patient management decisions. Negative results must be combined with clinical observations, patient history, and epidemiological information. The expected result is Negative. Fact Sheet for Patients: SugarRoll.be Fact Sheet for Healthcare Providers: https://www.woods-mathews.com/ This test is not yet approved or cleared by the [place] FDA and   has been authorized for detection and/or diagnosis of SARS-CoV-2 by FDA under an Emergency Use Authorization (EUA). This EUA will remain  in effect (meaning this test can be used) for the duration of the COVID-19 declaration under Section 56 4(b)(1) of the Act, 21 U.S.C. section 360bbb-3(b)(1), unless the authorization is terminated or revoked sooner. Performed at [facility], [place] [address], [place], [place] [address]   MRSA PCR Screening     Status: None   Collection Time: 03/11/20  3:06 AM   Specimen: Nasal Mucosa; Nasopharyngeal  Result Value Ref Range Status   MRSA by PCR NEGATIVE NEGATIVE Final    Comment:        The GeneXpert MRSA Assay (FDA approved for NASAL specimens only), is one component of a comprehensive MRSA colonization surveillance program. It is not intended to diagnose MRSA infection nor to guide or monitor treatment for MRSA infections. Performed at [facility], [place] [address], [place], [place] [address]   Body fluid culture     Status: None   Collection Time: 03/13/20  4:44 PM   Specimen: Pleura; Body Fluid  Result Value Ref Range Status   Specimen Description PLEURAL FLUID RIGHT  Final   Special Requests Normal  Final   Gram Stain   Final  CYTOSPIN SMEAR WBC PRESENT, PREDOMINANTLY MONONUCLEAR NO ORGANISMS SEEN    Culture   Final    NO GROWTH 3 DAYS Performed at [facility] [address], [place], [place] [address]    Report Status 03/16/2020 FINAL  Final    RADIOLOGY STUDIES/RESULTS: No results found.   LOS: 8 days   Signature  Lala Lund M.D on 03/19/2020 at 9:07 AM   -  To page go to [email or web address]

## 2020-03-19 NOTE — TOC Progression Note (Addendum)
Transition of Care (TOC) - Progression Note  *Patient will need COVID test prior to discharge.   Patient Details  Name: Gabriel Lobue Spadoni Sr. MRN: [id number] Date of Birth: 1929-06-09  Transition of Care [facility]) CM/SW Contact  Sharlet Salina Mila Homer, LCSW Phone Number: 03/19/2020, 1:14 PM  Clinical Narrative:  Per MD note patient medically stable for discharge  Call made to son, [name] [phone]) regarding discharge and [facility] is his preference. Irine Seal, admissions director contacted and CSW advised that the lift on their [place] Lei is broken and won't be repaired until Monday and they can restart transportation on Tuesday. Son contacted and updated and [name] indicated that he can take his dad to dialysis. Irine Seal contacted & informed and indicated that she will have to check and determine if they can allow family to transport. During CSW's conversation, son expressed concern regarding his dad's hemoglobin. MD contacted, advised and responded to CSW that he talked with son regarding this.    CSW conferred with dialysis coordinator Jaclyn Shaggy and was provided with patient's HD schedule: MWF at [facility] on [address]. Chair time 10:15 with patient arriving by 9:55/10 am. Patient continues to have a tele-sitter and MD informed (via secure chat) that patient must be sitter free for 24 hours before he can discharge.   Irine Seal [facility]) contacted and provided with HD schedule and tele-sitter status. CSW advised that son can transport his dad, but must sign a release form and they must ensure that son knows what his dad can and can't do in terms of getting in and out of a vehicle. Irine Seal advised that she will be informed once tele-sitter discontinued. Call made to son and he was updated regarding being able to transport his dad to dialysis on Monday (if patient discharges) and having to sign a release form and his father not being able to d/c until 24 hours without the tele-sitter (explained to  son what this is). Son advised of MD's response and [name] clarified that he wants to talk with MD regarding a 'decrease' in his dad's hemoglobin, (MD contacted via secure chat and provided clarity on what son wanted to talk with him about).  Son advised that he will be kept informed regarding when his dad will discharge.        Expected Discharge Plan: [place] Barriers to Discharge: Continued Medical Work up  Expected Discharge Plan and Services Expected Discharge Plan: [place] In-house Referral: Clinical Social Work   Post Acute Care Choice: [place] Living arrangements for the past 2 months: Single Family Home                                       Social Determinants of Health (SDOH) Interventions    Readmission Risk Interventions No flowsheet data found.

## 2020-03-19 NOTE — Procedures (Signed)
Patient seen and examined on Hemodialysis. BP 133/61 (BP Location: Right Arm)   Pulse 81   Temp 98 F (36.7 C) (Oral)   Resp 12   Ht 5\' 9"  (1.753 m)   Wt 70.5 kg   SpO2 91%   BMI 22.95 kg/m   QB 400 mL/ min via TDC UF goal 2L  Tolerating treatment without complaints at this time.  Madelon Lips MD [facility] pgr [phone] 9:07 AM

## 2020-03-19 NOTE — Progress Notes (Signed)
[facility] Progress Note   Subjective:   Patient seen and examined on dialysis.  Looks like blood sugars were better yesterday overall but AM sugar 56  Objective Vitals:   03/18/20 0605 03/18/20 0830 03/18/20 2023 03/19/20 0449  BP: (!) 136/51 (!) 132/55 (!) 164/78 133/61  Pulse: (!) 58 63 90 81  Resp: 20 18 20 12   Temp: 98.2 F (36.8 C) 98 F (36.7 C) 98.1 F (36.7 C) 98 F (36.7 C)  TempSrc: Oral Oral  Oral  SpO2: 100% 98% 100% 91%  Weight:   70.5 kg   Height:       Physical Exam General:chronically ill appearing, frail male in NAD Heart:RRR Lungs:+CTAB anteriorly  Abdomen:soft, NTND Extremities:no LE edema Dialysis Access: [facility]   Filed Weights   03/18/20 0125 03/18/20 0540 03/18/20 2023  Weight: 66.7 kg 66.7 kg 70.5 kg    Intake/Output Summary (Last 24 hours) at 03/19/2020 0901 Last data filed at 03/19/2020 0450 Gross per 24 hour  Intake 820 ml  Output 3 ml  Net 817 ml    Additional Objective Labs: Basic Metabolic Panel: Recent Labs  Lab 03/14/20 0811 03/15/20 0800 03/17/20 1048 03/17/20 1600 03/18/20 0140 03/18/20 1004  NA 136   < > 133*  --  134* 138  K 4.1   < > 3.8  --  3.6 3.6  CL 95*   < > 96*  --  94* 102  CO2 25   < > 20*  --  25 27  GLUCOSE 88   < > 143*  --  126* 105*  BUN 34*   < > 57*  --  70* 30*  CREATININE 4.92*   < > 6.33*  --  6.03* 3.00*  CALCIUM 10.5*   < > 9.5  --  8.7* 8.6*  PHOS 2.5  --   --  4.0 4.6  --    < > = values in this interval not displayed.   Liver Function Tests: Recent Labs  Lab 03/16/20 0549 03/16/20 0549 03/17/20 1048 03/18/20 0140 03/18/20 1004  AST 106*  --  92*  --  142*  ALT 70*  --  68*  --  119*  ALKPHOS 85  --  77  --  61  BILITOT 0.8  --  0.6  --  0.8  PROT 6.9  --  6.6  --  5.9*  ALBUMIN 3.5   < > 3.5 3.0* 3.2*   < > = values in this interval not displayed.   No results for input(s): LIPASE, AMYLASE in the last 168 hours. CBC: Recent Labs  Lab 03/14/20 0811  03/14/20 0811 03/15/20 0800 03/15/20 0800 03/16/20 0549 03/17/20 1048 03/18/20 1004  WBC 16.0*   < > 15.7*   < > 14.5* 19.4* 17.2*  NEUTROABS  --   --  12.4*   < > 12.7* 16.8* 14.0*  HGB 11.6*   < > 9.2*   < > 11.0* 10.4* 8.2*  HCT 37.7*   < > 30.0*   < > 35.3* 33.6* 26.1*  MCV 101.3*  --  101.7*  --  101.7* 101.2* 100.8*  PLT 280   < > 292   < > 308 356 286   < > = values in this interval not displayed.   Blood Culture    Component Value Date/Time   SDES PLEURAL FLUID RIGHT 03/13/2020 1644   SPECREQUEST Normal 03/13/2020 1644   CULT  03/13/2020 1644    NO GROWTH  3 DAYS Performed at [facility], [place] [address], [place], [place] [address]    REPTSTATUS 03/16/2020 FINAL 03/13/2020 1644    Cardiac Enzymes: No results for input(s): CKTOTAL, CKMB, CKMBINDEX, TROPONINI in the last 168 hours. CBG: Recent Labs  Lab 03/18/20 1108 03/18/20 1556 03/18/20 2017 03/18/20 2350 03/19/20 0402  GLUCAP 80 113* 153* 106* 102*   Iron Studies: No results for input(s): IRON, TIBC, TRANSFERRIN, FERRITIN in the last 72 hours. Lab Results  Component Value Date   INR 1.1 11/27/2019   INR 1.24 10/05/2016   INR 1.36 12/06/2015   Studies/Results: No results found.  Medications: . sodium chloride    . sodium chloride    . sodium chloride    . sodium chloride     . allopurinol  100 mg Oral Daily  . aspirin EC  81 mg Oral Daily  . calcitRIOL  1 mcg Oral Q M,W,F-HD  . carvedilol  3.125 mg Oral BID WC  . Chlorhexidine Gluconate Cloth  6 each Topical Q0600  . cinacalcet  30 mg Oral Q M,W,F-HD  . citalopram  10 mg Oral Daily  . darbepoetin (ARANESP) injection - DIALYSIS  100 mcg Intravenous Q Fri-HD  . feeding supplement (NEPRO CARB STEADY)  237 mL Oral QHS  . feeding supplement (PRO-STAT SUGAR FREE 64)  30 mL Oral BID  . BioGaia Probiotic  5 drop Oral Q2000  . latanoprost  1 drop Both Eyes QHS  . liver oil-zinc oxide   Topical BID  . midodrine  10 mg Oral TID  . multivitamin   1 tablet Oral QHS  . predniSONE  50 mg Oral BID WC  . rosuvastatin  20 mg Oral QHS    Dialysis Orders: [facility]- MWF 4 hours, 400 BFR via TDC EDW 66- 2 K, 2 calc bath. No heparin  Calcitriol 1.5 mcg and sensipar 30 q HD, mircera 30  Assessment/Plan: 1.Confusion- waxing and waning, baseline dementia, hypoglycemic on arrival, +UTI, recent SDH, repeat CT head 5/5 redemonstrating R sided subdural with minimal decrease . Rocephin given.  2.Hypoglycemia- per primary-->CT abd 5/10 with no obvious pancreatic lesion. ACTH pending, proinsulin pending, C-peptide (16.1)  Insulin (21.8), sulfonylureas pendin. ACTH stimulation test and TSH stable. On PO prednisone and to f/u as OP w/Endocrine.  3. Bilateral pleural effusions - s/p thoracentesis 5/8. CT abd showed no reoccurrence.  4. ESRD-on HD MWF. No Heparin.on Schedule MWF.  5. Anemiaof CKD-Hgb drop 8.2, no bleeding reported. ESA ordered with HD today ^dose 128mcg.   6. Secondary hyperparathyroidism- Donzetta Matters goal withdecrease VDRA.Phosimproved to 2.5s/p IV repletion.  Low d/t poor intake. Phos 4 on recheck. Hold binders.Continue sensipar 7.HTN/volume- Blood pressuredrop during HD. Does not appear volume overloaded.Per weights this AM close to EDW.  Minimal UF with HD.   8. Nutrition- Renal diet w/fluid restrictions.  Madelon Lips MD [facility] Pager: [phone] 03/19/2020,9:01 AM  LOS: 8 days

## 2020-03-20 LAB — GLUCOSE, CAPILLARY
Glucose-Capillary: 110 mg/dL — ABNORMAL HIGH (ref 70–99)
Glucose-Capillary: 91 mg/dL (ref 70–99)
Glucose-Capillary: 108 mg/dL — ABNORMAL HIGH (ref 70–99)
Glucose-Capillary: 116 mg/dL — ABNORMAL HIGH (ref 70–99)
Glucose-Capillary: 134 mg/dL — ABNORMAL HIGH (ref 70–99)

## 2020-03-20 LAB — CBC WITH DIFFERENTIAL/PLATELET
Abs Immature Granulocytes: 0.27 10*3/uL — ABNORMAL HIGH (ref 0.00–0.07)
Basophils Absolute: 0 10*3/uL (ref 0.0–0.1)
Basophils Relative: 0 %
Eosinophils Absolute: 0 10*3/uL (ref 0.0–0.5)
Eosinophils Relative: 0 %
HCT: 27.5 % — ABNORMAL LOW (ref 39.0–52.0)
Hemoglobin: 8.7 g/dL — ABNORMAL LOW (ref 13.0–17.0)
Immature Granulocytes: 2 %
Lymphocytes Relative: 4 %
Lymphs Abs: 0.7 10*3/uL (ref 0.7–4.0)
MCH: 31.3 pg (ref 26.0–34.0)
MCHC: 31.6 g/dL (ref 30.0–36.0)
MCV: 98.9 fL (ref 80.0–100.0)
Monocytes Absolute: 1.8 10*3/uL — ABNORMAL HIGH (ref 0.1–1.0)
Monocytes Relative: 11 %
Neutro Abs: 12.8 10*3/uL — ABNORMAL HIGH (ref 1.7–7.7)
Neutrophils Relative %: 83 %
Platelets: 273 10*3/uL (ref 150–400)
RBC: 2.78 MIL/uL — ABNORMAL LOW (ref 4.22–5.81)
RDW: 20 % — ABNORMAL HIGH (ref 11.5–15.5)
WBC: 15.5 10*3/uL — ABNORMAL HIGH (ref 4.0–10.5)
nRBC: 1.2 % — ABNORMAL HIGH (ref 0.0–0.2)

## 2020-03-20 LAB — TYPE AND SCREEN
ABO/RH(D): O POS
Antibody Screen: NEGATIVE
Unit division: 0

## 2020-03-20 LAB — HEMOGLOBIN AND HEMATOCRIT, BLOOD
HCT: 27.4 % — ABNORMAL LOW (ref 39.0–52.0)
Hemoglobin: 8.7 g/dL — ABNORMAL LOW (ref 13.0–17.0)

## 2020-03-20 LAB — BPAM RBC
Blood Product Expiration Date: 202106152359
ISSUE DATE / TIME: 202105141721
Unit Type and Rh: 5100

## 2020-03-20 LAB — RENAL FUNCTION PANEL
Albumin: 3.1 g/dL — ABNORMAL LOW (ref 3.5–5.0)
Anion gap: 14 (ref 5–15)
BUN: 43 mg/dL — ABNORMAL HIGH (ref 8–23)
CO2: 24 mmol/L (ref 22–32)
Calcium: 9 mg/dL (ref 8.9–10.3)
Chloride: 99 mmol/L (ref 98–111)
Creatinine, Ser: 3.77 mg/dL — ABNORMAL HIGH (ref 0.61–1.24)
GFR calc Af Amer: 16 mL/min — ABNORMAL LOW (ref >60)
GFR calc non Af Amer: 13 mL/min — ABNORMAL LOW (ref >60)
Glucose, Bld: 113 mg/dL — ABNORMAL HIGH (ref 70–99)
Phosphorus: 4.3 mg/dL (ref 2.5–4.6)
Potassium: 4.2 mmol/L (ref 3.5–5.1)
Sodium: 137 mmol/L (ref 135–145)

## 2020-03-20 LAB — OCCULT BLOOD X 1 CARD TO LAB, STOOL: Fecal Occult Bld: POSITIVE — AB

## 2020-03-20 LAB — AMMONIA: Ammonia: 21 umol/L (ref 9–35)

## 2020-03-20 LAB — LACTATE DEHYDROGENASE: LDH: 474 U/L — ABNORMAL HIGH (ref 98–192)

## 2020-03-20 NOTE — Plan of Care (Signed)
  Problem: Education: Goal: Knowledge of General Education information will improve Description: Including pain rating scale, medication(s)/side effects and non-pharmacologic comfort measures Outcome: Progressing   Problem: Safety: Goal: Ability to remain free from injury will improve Outcome: Progressing   

## 2020-03-20 NOTE — Progress Notes (Addendum)
[facility] Progress Note   Subjective:  Seen in room - eating breakfast. No CP/dyspnea.  Objective Vitals:   03/19/20 1726 03/19/20 1730 03/19/20 2019 03/20/20 0401  BP: (!) 120/45 111/85 133/61 (!) 126/59  Pulse: 90 90 83 87  Resp: 20 20 18 14   Temp: 98.5 F (36.9 C) 98.7 F (37.1 C) 98.8 F (37.1 C) 98.7 F (37.1 C)  TempSrc: Oral Oral Oral Oral  SpO2: 95% 100% 96% 100%  Weight:      Height:       Physical Exam General: Frail appearing man, NAD. Slightly confused, but pleasant Heart: RRR; no murmur Lungs: CTAB Abdomen: firm upper abdomen, non-tender Extremities: No LE edema Dialysis Access:  [facility]  Additional Objective Labs: Basic Metabolic Panel: Recent Labs  Lab 03/17/20 1048 03/17/20 1600 03/18/20 0140 03/18/20 1004 03/19/20 1125  NA   < >  --  134* 138 136  K   < >  --  3.6 3.6 3.7  CL   < >  --  94* 102 98  CO2   < >  --  25 27 27   GLUCOSE   < >  --  126* 105* 101*  BUN   < >  --  70* 30* 9  CREATININE   < >  --  6.03* 3.00* 1.24  CALCIUM   < >  --  8.7* 8.6* 8.1*  PHOS  --  4.0 4.6  --  1.5*   < > = values in this interval not displayed.   Liver Function Tests: Recent Labs  Lab 03/16/20 0549 03/16/20 0549 03/17/20 1048 03/17/20 1048 03/18/20 0140 03/18/20 1004 03/19/20 1125  AST 106*  --  92*  --   --  142*  --   ALT 70*  --  68*  --   --  119*  --   ALKPHOS 85  --  77  --   --  61  --   BILITOT 0.8  --  0.6  --   --  0.8  --   PROT 6.9  --  6.6  --   --  5.9*  --   ALBUMIN 3.5   < > 3.5   < > 3.0* 3.2* 3.2*   < > = values in this interval not displayed.   CBC: Recent Labs  Lab 03/16/20 0549 03/16/20 0549 03/17/20 1048 03/17/20 1048 03/18/20 1004 03/18/20 1004 03/19/20 1122 03/19/20 1407 03/20/20 0344  WBC 14.5*   < > 19.4*   < > 17.2*  --  16.4* 16.6*  --   NEUTROABS 12.7*  --  16.8*  --  14.0*  --   --   --   --   HGB 11.0*   < > 10.4*   < > 8.2*   < > 7.2* 7.5* 8.7*  HCT 35.3*   < > 33.6*   < > 26.1*   < >  22.3* 23.4* 27.4*  MCV 101.7*  --  101.2*  --  100.8*  --  100.5* 100.9*  --   PLT 308   < > 356   < > 286  --  278 275  --    < > = values in this interval not displayed.   CBG: Recent Labs  Lab 03/19/20 1639 03/19/20 2019 03/19/20 2357 03/20/20 0402 03/20/20 0738  GLUCAP 108* 129* 144* 110* 91   Medications: . chlorproMAZINE (THORAZINE) IV     . allopurinol  100 mg Oral Daily  .  aspirin EC  81 mg Oral Daily  . calcitRIOL  1 mcg Oral Q M,W,F-HD  . carvedilol  3.125 mg Oral BID WC  . Chlorhexidine Gluconate Cloth  6 each Topical Q0600  . cinacalcet  30 mg Oral Q M,W,F-HD  . citalopram  10 mg Oral Daily  . darbepoetin (ARANESP) injection - DIALYSIS  100 mcg Intravenous Q Fri-HD  . feeding supplement (NEPRO CARB STEADY)  237 mL Oral QHS  . feeding supplement (PRO-STAT SUGAR FREE 64)  30 mL Oral BID  . ferrous sulfate  325 mg Oral BID WC  . BioGaia Probiotic  5 drop Oral Q2000  . latanoprost  1 drop Both Eyes QHS  . liver oil-zinc oxide   Topical BID  . midodrine  10 mg Oral TID  . multivitamin  1 tablet Oral QHS  . pantoprazole  40 mg Oral BID  . predniSONE  50 mg Oral BID WC  . rosuvastatin  20 mg Oral QHS    Dialysis Orders: MWF at [facility] 4 hours, 400 BFR via TDC EDW 66 2K/2CaNo heparin - Calcitriol 1.5 mcg and sensipar 30 q HD, mircera 30  Assessment/Plan: 1.Confusion: Variable with baseline dementia. Hypoglycemic on arrival, + UTI -> treated with Ceftriaxone. Recent SDH, repeat CT head 5/5 redemonstrating R sided subdural with minimal decrease.  2.Hypoglycemia- per primary-->CT abd 5/10 with no obvious pancreatic lesion. ACTH normal, proinsulin pending, C-peptide (16.1)  Insulin (21.8), sulfonylureas negative/ND. ACTH stimulation test and TSH stable. On PO prednisone and to f/u as OP w/Endocrine.  3. Bilateral pleural effusions - s/p thoracentesis 5/8. CT abd showed no recurrence.  4. ESRD- Continue HD per MWF schedule - next Monday. 5. Anemiaof CKD -Hgb  dropped 5/14, no bleeding noted. Aranesp 146mcg given.  6. Secondary hyperparathyroidism - Ca ok, Last Phos low again but looks like may have been drawn post-HD - will repeat. 7.HTN/volume- Blood pressuredrops during HD. On mido TID. Does not appear volume overloaded, but with ^ BNP + pleural effusions -> UF as tolerated. 8. Nutrition- Alb low - continue Nepro + pro-stat supplements. 9. Transaminitis: LFTs rising. Ammonia ok on admit, will recheck. 10. L elbow ?hematoma, L hip hematoma on CT     Katie Mikie Misner, PA-C 03/20/2020, 9:09 AM  [facility]

## 2020-03-20 NOTE — Progress Notes (Signed)
PROGRESS NOTE        PATIENT DETAILS Name: Gabriel Peretti Scarano Sr. Age: 86 y.o. Sex: male Date of Birth: 04-17-1929 Admit Date: 03/10/2020 Admitting Physician Evalee Mutton Kristeen Mans, MD JSE:GBTD, Hunt Oris, MD  Brief Narrative:  Patient is a 86 y.o. male with history of ESRD on HD MWF, HLD, anemia of chronic disease, orthostatic hypotension, depression/dementia who was found to be confused (more than usual) at HD and sent to the hospital for further evaluation.  See below for further details.  Significant events:  5/5>> readmit to [facility] for evaluation of encephalopathy/hypoglycemia. 5/1-5/4>> admit to [facility]-frequent falls-found to have SDH, left hip hematoma, acute blood loss anemia requiring 2 units of PRBC  Antimicrobial therapy:  Rocephin: 5/5>>5/8  Microbiology data: None  Procedures :  5/8 >>  Right-sided ultrasound-guided thoracentesis by pulmonary critical care, transudative fluid  5/8>> CT chest: Slight enlargement of bilateral pleural effusions, compressive atelectasis bilateral lower lobes 5/8>> CT head: Minimal decrease in SDH  Consults:  Nephrology PCCM  DVT Prophylaxis :  SCD's  Subjective:  Patient in bed, appears comfortable, denies any headache, no fever, no chest pain or pressure, no shortness of breath , no abdominal pain. No focal weakness.   Assessment/Plan:  Recurrent hypoglycemia: Continues to recur-etiology uncertain -  suspect that this is from frailty/poor oral intake/underlying ESRD.  But given recurrent hypoglycemia (was also occurring last admission) - pending  ACTH, C-peptide, proinsulin, sulfonylureas level. Stable A1c along with stable TSH, stable ACTH stimulation test.  C-peptide was quite high  although he was on low-dose D5 drip at that time however it still quite significant, stable CT abdomen pelvis with and without contrast to rules out an obvious pancreatic lesion, discussed with endocrinologist Dr. [name] on  03/15/2020, sugars have improved once he has been placed on twice daily prednisone which will be continued, please make sure he is not n.p.o. for prolonged.  Of time and has breakfast early in the morning specially on the days when he is going for HD.   Recent Labs    03/19/20 2357 03/20/20 0402 03/20/20 0738  GLUCAP 144* 110* 91    Acute metabolic encephalopathy: Does have underlying dementia-but mental status has improved compared to on admission.  Suspect etiology of encephalopathy to be a combination of hypoglycemia, recent SDH/ongoing UTI/infection.  CT head on admission showed decreased size of the SDH.  Continue supportive care.    Complicated UTI/ ?Asp PNA with persistent bilateral pleural effusion (right>>left): Was adequately treated with antibiotics, underwent diagnostic right sided thoracentesis showing transudative fluid suggestive of volume overload due to missed dialysis.  Clinically much improved.  Speech following for underlying mild dysphagia.  Currently no signs of active infection.  ESRD on HD MWF: Nephrology following and directing care  Recent SDH: Following a mechanical fall (last admission)-repeat CT head this admission shows decreased size of SDH.  Recent left hip hematoma: Following a mechanical fall (last admission)- supportive care.  Hemoglobin currently stable-Did require 2 Units of PRBC transfusion last admission (03/09/20).  AOCD: Related to ESRD-hemoglobin, worsened by frequent blood draws in the hospital on top of ESRD causing anemia of chronic disease and marrow suppression, placed on PPI, oral iron, 1 unit of packed RBC transfusion on 2020-03-19 as he is getting overall weak. Monitor H&H - CBC pending on 03/20/2020 if it stays stable today and tomorrow likely  discharge to SNF also ordered haptoglobin, LDH and fecal occult blood. Had detailed discussion with patient's son bedside poor candidate for invasive procedures. For now supportive care and  monitor.   Orthostatic hypotension: Continue midodrine-follow.  Depression: Continue Celexa  History of dementia: Stable-encephalopathy improving-suspect mentation not far from usual baseline.  Gout: No flare-continue allopurinol    Diet:  Diet Order            DIET - DYS 1 Room service appropriate? No; Fluid consistency: Thin  Diet effective now               Code Status: Full code  Family Communication:  Patient's son Salar [phone]) on 03/14/20, 03/15/20, called her son on 03/17/2020 at 10:26 AM, full voicemail, updated again over the phone on 03/18/20, updated in detail bedside 03/19/20  Disposition Plan: Status is: Inpatient  Remains inpatient appropriate because:Altered mental status  Dispo: The patient is from: Home              Anticipated d/c is to: SNF              Anticipated d/c date is: As soon as SNF bed available              Patient currently is medically stable to d/c.   Barriers to Discharge: Hypoglycemia/ongoing encephalopathy/recurrent pleural effusion   Antimicrobial agents: Anti-infectives (From admission, onward)   Start     Dose/Rate Route Frequency Ordered Stop   03/10/20 2200  amoxicillin-clavulanate (AUGMENTIN) 500-125 MG per tablet 500 mg  Status:  Discontinued     500 mg Oral Daily at bedtime 03/10/20 1500 03/10/20 1752   03/10/20 1800  cefTRIAXone (ROCEPHIN) 2 g in sodium chloride 0.9 % 100 mL IVPB  Status:  Discontinued     2 g 200 mL/hr over 30 Minutes Intravenous Every 24 hours 03/10/20 1752 03/13/20 1421       Time spent:  25 minutes-Greater than 50% of this time was spent in counseling, explanation of diagnosis, planning of further management, and coordination of care.  MEDICATIONS:  Scheduled Meds:  . allopurinol  100 mg Oral Daily  . aspirin EC  81 mg Oral Daily  . calcitRIOL  1 mcg Oral Q M,W,F-HD  . carvedilol  3.125 mg Oral BID WC  . Chlorhexidine Gluconate Cloth  6 each Topical Q0600  . cinacalcet  30 mg  Oral Q M,W,F-HD  . citalopram  10 mg Oral Daily  . darbepoetin (ARANESP) injection - DIALYSIS  100 mcg Intravenous Q Fri-HD  . feeding supplement (NEPRO CARB STEADY)  237 mL Oral QHS  . feeding supplement (PRO-STAT SUGAR FREE 64)  30 mL Oral BID  . ferrous sulfate  325 mg Oral BID WC  . BioGaia Probiotic  5 drop Oral Q2000  . latanoprost  1 drop Both Eyes QHS  . liver oil-zinc oxide   Topical BID  . midodrine  10 mg Oral TID  . multivitamin  1 tablet Oral QHS  . pantoprazole  40 mg Oral BID  . predniSONE  50 mg Oral BID WC  . rosuvastatin  20 mg Oral QHS   Continuous Infusions: . chlorproMAZINE (THORAZINE) IV     PRN Meds:.acetaminophen **OR** [DISCONTINUED] acetaminophen, albuterol, chlorproMAZINE (THORAZINE) IV, diphenhydrAMINE, nystatin, sodium chloride flush   PHYSICAL EXAM: Vital signs: Vitals:   03/19/20 1730 03/19/20 2019 03/20/20 0401 03/20/20 0952  BP: 111/85 133/61 (!) 126/59 133/63  Pulse: 90 83 87 85  Resp: 20 18 14  16  Temp: 98.7 F (37.1 C) 98.8 F (37.1 C) 98.7 F (37.1 C) 98.4 F (36.9 C)  TempSrc: Oral Oral Oral Oral  SpO2: 100% 96% 100% 98%  Weight:      Height:       Filed Weights   03/18/20 0125 03/18/20 0540 03/18/20 2023  Weight: 66.7 kg 66.7 kg 70.5 kg   Body mass index is 22.95 kg/m.   EXAM  Awake , mildly confused, No new F.N deficits,   [place].AT,PERRAL Supple Neck,No JVD, No cervical lymphadenopathy appriciated.  Symmetrical Chest wall movement, Good air movement bilaterally, CTAB RRR,No Gallops, Rubs or new Murmurs, No Parasternal Heave +ve B.Sounds, Abd Soft, No tenderness, No organomegaly appriciated, No rebound - guarding or rigidity. No Cyanosis, Clubbing or edema, No new Rash or bruise  I have personally reviewed following labs and imaging studies  LABORATORY DATA:  CBC: Recent Labs  Lab 03/15/20 0800 03/15/20 0800 03/16/20 0549 03/16/20 0549 03/17/20 1048 03/18/20 1004 03/19/20 1122 03/19/20 1407 03/20/20 0344  WBC  15.7*   < > 14.5*  --  19.4* 17.2* 16.4* 16.6*  --   NEUTROABS 12.4*  --  12.7*  --  16.8* 14.0*  --   --   --   HGB 9.2*   < > 11.0*   < > 10.4* 8.2* 7.2* 7.5* 8.7*  HCT 30.0*   < > 35.3*   < > 33.6* 26.1* 22.3* 23.4* 27.4*  MCV 101.7*   < > 101.7*  --  101.2* 100.8* 100.5* 100.9*  --   PLT 292   < > 308  --  356 286 278 275  --    < > = values in this interval not displayed.    Basic Metabolic Panel: Recent Labs  Lab 03/14/20 0811 03/14/20 0811 03/15/20 0800 03/15/20 0800 03/16/20 0549 03/17/20 1048 03/17/20 1600 03/18/20 0140 03/18/20 1004 03/19/20 1125  NA 136   < > 135   < > 136 133*  --  134* 138 136  K 4.1   < > 4.0   < > 4.3 3.8  --  3.6 3.6 3.7  CL 95*   < > 95*   < > 96* 96*  --  94* 102 98  CO2 25   < > 30   < > 26 20*  --  25 27 27   GLUCOSE 88   < > 101*   < > 105* 143*  --  126* 105* 101*  BUN 34*   < > 49*   < > 29* 57*  --  70* 30* 9  CREATININE 4.92*   < > 6.85*   < > 4.00* 6.33*  --  6.03* 3.00* 1.24  CALCIUM 10.5*   < > 9.8   < > 9.3 9.5  --  8.7* 8.6* 8.1*  MG 2.4  --  2.6*  --  2.2 2.1  --   --  1.9  --   PHOS 2.5  --   --   --   --   --  4.0 4.6  --  1.5*   < > = values in this interval not displayed.    GFR: Estimated Creatinine Clearance: 41.1 mL/min (by C-G formula based on SCr of 1.24 mg/dL).  Liver Function Tests: Recent Labs  Lab 03/14/20 0811 03/14/20 0811 03/15/20 0800 03/15/20 0800 03/16/20 0549 03/17/20 1048 03/18/20 0140 03/18/20 1004 03/19/20 1125  AST  --   --  47*  --  106* 92*  --  142*  --   ALT  --   --  28  --  70* 68*  --  119*  --   ALKPHOS  --   --  68  --  85 77  --  61  --   BILITOT  --   --  0.7  --  0.8 0.6  --  0.8  --   PROT 7.2  --  5.9*  --  6.9 6.6  --  5.9*  --   ALBUMIN 3.5   < > 3.0*   < > 3.5 3.5 3.0* 3.2* 3.2*   < > = values in this interval not displayed.   No results for input(s): LIPASE, AMYLASE in the last 168 hours. No results for input(s): AMMONIA in the last 168 hours.  Coagulation Profile: No  results for input(s): INR, PROTIME in the last 168 hours.  Cardiac Enzymes: No results for input(s): CKTOTAL, CKMB, CKMBINDEX, TROPONINI in the last 168 hours.  BNP (last 3 results) No results for input(s): PROBNP in the last 8760 hours.  Lipid Profile: No results for input(s): CHOL, HDL, LDLCALC, TRIG, CHOLHDL, LDLDIRECT in the last 72 hours.  Thyroid Function Tests: No results for input(s): TSH, T4TOTAL, FREET4, T3FREE, THYROIDAB in the last 72 hours.  Anemia Panel: No results for input(s): VITAMINB12, FOLATE, FERRITIN, TIBC, IRON, RETICCTPCT in the last 72 hours.  Urine analysis:    Component Value Date/Time   COLORURINE YELLOW 03/07/2020 1128   APPEARANCEUR HAZY (A) 03/07/2020 1128   LABSPEC 1.008 03/07/2020 1128   PHURINE 9.0 (H) 03/07/2020 1128   GLUCOSEU NEGATIVE 03/07/2020 1128   GLUCOSEU NEGATIVE 01/30/2020 1518   HGBUR SMALL (A) 03/07/2020 1128   BILIRUBINUR NEGATIVE 03/07/2020 1128   KETONESUR NEGATIVE 03/07/2020 1128   PROTEINUR 100 (A) 03/07/2020 1128   UROBILINOGEN 0.2 01/30/2020 1518   NITRITE NEGATIVE 03/07/2020 1128   LEUKOCYTESUR LARGE (A) 03/07/2020 1128    Sepsis Labs: Lactic Acid, Venous    Component Value Date/Time   LATICACIDVEN 0.66 12/08/2016 0605    MICROBIOLOGY: Recent Results (from the past 240 hour(s))  SARS CORONAVIRUS 2 (TAT 6-24 HRS) Nasopharyngeal Nasopharyngeal Swab     Status: None   Collection Time: 03/10/20  1:20 PM   Specimen: Nasopharyngeal Swab  Result Value Ref Range Status   SARS Coronavirus 2 NEGATIVE NEGATIVE Final    Comment: (NOTE) SARS-CoV-2 target nucleic acids are NOT DETECTED. The SARS-CoV-2 RNA is generally detectable in upper and lower respiratory specimens during the acute phase of infection. Negative results do not preclude SARS-CoV-2 infection, do not rule out co-infections with other pathogens, and should not be used as the sole basis for treatment or other patient management decisions. Negative results  must be combined with clinical observations, patient history, and epidemiological information. The expected result is Negative. Fact Sheet for Patients: SugarRoll.be Fact Sheet for Healthcare Providers: https://www.woods-mathews.com/ This test is not yet approved or cleared by the [place] FDA and  has been authorized for detection and/or diagnosis of SARS-CoV-2 by FDA under an Emergency Use Authorization (EUA). This EUA will remain  in effect (meaning this test can be used) for the duration of the COVID-19 declaration under Section 56 4(b)(1) of the Act, 21 U.S.C. section 360bbb-3(b)(1), unless the authorization is terminated or revoked sooner. Performed at [facility], [place] [address], [place], [place] [address]   MRSA PCR Screening     Status: None   Collection Time: 03/11/20  3:06 AM   Specimen:  Nasal Mucosa; Nasopharyngeal  Result Value Ref Range Status   MRSA by PCR NEGATIVE NEGATIVE Final    Comment:        The GeneXpert MRSA Assay (FDA approved for NASAL specimens only), is one component of a comprehensive MRSA colonization surveillance program. It is not intended to diagnose MRSA infection nor to guide or monitor treatment for MRSA infections. Performed at [facility], [place] [address] [place], [place] [address]   Body fluid culture     Status: None   Collection Time: 03/13/20  4:44 PM   Specimen: Pleura; Body Fluid  Result Value Ref Range Status   Specimen Description PLEURAL FLUID RIGHT  Final   Special Requests Normal  Final   Gram Stain   Final    CYTOSPIN SMEAR WBC PRESENT, PREDOMINANTLY MONONUCLEAR NO ORGANISMS SEEN    Culture   Final    NO GROWTH 3 DAYS Performed at [facility], [address], [place],  [address]    Report Status 03/16/2020 FINAL  Final    RADIOLOGY STUDIES/RESULTS: No results found.   LOS: 9 days   Signature  Lala Lund M.D on 03/20/2020 at 10:16 AM    -  To page go to [email or web address]

## 2020-03-21 LAB — SARS CORONAVIRUS 2 (TAT 6-24 HRS): SARS Coronavirus 2: NEGATIVE

## 2020-03-21 LAB — CBC
HCT: 28 % — ABNORMAL LOW (ref 39.0–52.0)
Hemoglobin: 8.9 g/dL — ABNORMAL LOW (ref 13.0–17.0)
MCH: 31.2 pg (ref 26.0–34.0)
MCHC: 31.8 g/dL (ref 30.0–36.0)
MCV: 98.2 fL (ref 80.0–100.0)
Platelets: 293 10*3/uL (ref 150–400)
RBC: 2.85 MIL/uL — ABNORMAL LOW (ref 4.22–5.81)
RDW: 19.9 % — ABNORMAL HIGH (ref 11.5–15.5)
WBC: 19.7 10*3/uL — ABNORMAL HIGH (ref 4.0–10.5)
nRBC: 0.7 % — ABNORMAL HIGH (ref 0.0–0.2)

## 2020-03-21 LAB — GLUCOSE, CAPILLARY
Glucose-Capillary: 102 mg/dL — ABNORMAL HIGH (ref 70–99)
Glucose-Capillary: 113 mg/dL — ABNORMAL HIGH (ref 70–99)
Glucose-Capillary: 123 mg/dL — ABNORMAL HIGH (ref 70–99)
Glucose-Capillary: 138 mg/dL — ABNORMAL HIGH (ref 70–99)
Glucose-Capillary: 154 mg/dL — ABNORMAL HIGH (ref 70–99)

## 2020-03-21 MED ORDER — CHLORHEXIDINE GLUCONATE CLOTH 2 % EX PADS
6.0000 | MEDICATED_PAD | Freq: Every day | CUTANEOUS | Status: DC
  Administered 2020-03-22: 6 via TOPICAL

## 2020-03-21 NOTE — Plan of Care (Signed)
  Problem: Education: Goal: Knowledge of General Education information will improve Description: Including pain rating scale, medication(s)/side effects and non-pharmacologic comfort measures Outcome: Progressing   Problem: Safety: Goal: Ability to remain free from injury will improve Outcome: Progressing   

## 2020-03-21 NOTE — TOC Progression Note (Signed)
Transition of Care (TOC) - Progression Note    Patient Details  Name: Gabriel Riva Griffee Sr. MRN: [id number] Date of Birth: [birth year]  Transition of Care [facility]) CM/SW [place] Phone Number: 03/21/2020, 8:22 AM  Clinical Narrative:    CSW contacted by RN and informed patient is medically stable for discharge. Tele-sitter discontinued today, informed RN it needs to be 24 hours before patient can discharge to facility. Patient will be able to discharge Monday.   Expected Discharge Plan: [place] Barriers to Discharge: Continued Medical Work up  Expected Discharge Plan and Services Expected Discharge Plan: [place] In-house Referral: Clinical Social Work   Post Acute Care Choice: [place] Living arrangements for the past 2 months: Single Family Home Expected Discharge Date: 03/21/20                                     Social Determinants of Health (SDOH) Interventions    Readmission Risk Interventions No flowsheet data found.

## 2020-03-21 NOTE — Progress Notes (Signed)
PROGRESS NOTE        PATIENT DETAILS Name: Gabriel Loken Welby Sr. Age: 86 y.o. Sex: male Date of Birth: [birth year] Admit Date: 03/10/2020 Admitting Physician Evalee Mutton Kristeen Mans, MD LOV:FIEP, Hunt Oris, MD  Brief Narrative:  Patient is a 86 y.o. male with history of ESRD on HD MWF, HLD, anemia of chronic disease, orthostatic hypotension, depression/dementia who was found to be confused (more than usual) at HD and sent to the hospital for further evaluation.  See below for further details.  Significant events:  5/5>> readmit to [facility] for evaluation of encephalopathy/hypoglycemia. 5/1-5/4>> admit to [facility]-frequent falls-found to have SDH, left hip hematoma, acute blood loss anemia requiring 2 units of PRBC  Antimicrobial therapy:  Rocephin: 5/5>>5/8  Microbiology data: None  Procedures :  5/8 >>  Right-sided ultrasound-guided thoracentesis by pulmonary critical care, transudative fluid  5/8>> CT chest: Slight enlargement of bilateral pleural effusions, compressive atelectasis bilateral lower lobes 5/8>> CT head: Minimal decrease in SDH  Consults:  Nephrology PCCM  DVT Prophylaxis :  SCD's  Subjective:  Patient in bed, appears comfortable, denies any headache, no fever, no chest pain or pressure, no shortness of breath , no abdominal pain. No focal weakness.  Assessment/Plan:  Recurrent hypoglycemia: Continues to recur-etiology uncertain -  suspect that this is from frailty/poor oral intake/underlying ESRD.  But given recurrent hypoglycemia (was also occurring last admission) - pending proinsulin/insulin ratio.  ACTH 31, negative sulfonylureas level random insulin 21.8 with elevated C-peptide levels, Stable A1c along with stable TSH, stable ACTH stimulation test.  C-peptide was quite high  although he was on low-dose D5 drip at that time however it still quite significant, stable CT abdomen pelvis with and without contrast to rules out an obvious pancreatic  lesion, discussed with endocrinologist Dr. [name] on 03/15/2020, sugars have improved once he has been placed on twice daily prednisone which will be continued, please make sure he is not n.p.o. for prolonged.  Of time and has breakfast early in the morning specially on the days when he is going for HD.   Recent Labs    03/20/20 2202 03/21/20 0025 03/21/20 0647  GLUCAP 134* 123* 113*    Acute metabolic encephalopathy: Does have underlying dementia-but mental status has improved compared to on admission.  Suspect etiology of encephalopathy to be a combination of hypoglycemia, recent SDH/ongoing UTI/infection.  CT head on admission showed decreased size of the SDH.  Continue supportive care.    Complicated UTI/ ?Asp PNA with persistent bilateral pleural effusion (right>>left): Was adequately treated with antibiotics, underwent diagnostic right sided thoracentesis showing transudative fluid suggestive of volume overload due to missed dialysis.  Clinically much improved.  Speech following for underlying mild dysphagia.  Currently no signs of active infection.  ESRD on HD MWF: Nephrology following and directing care  Recent SDH: Following a mechanical fall (last admission)-repeat CT head this admission shows decreased size of SDH.  Recent left hip hematoma: Following a mechanical fall (last admission)- supportive care.  Hemoglobin currently stable-Did require 2 Units of PRBC transfusion last admission (03/09/20).  AOCD: Related to ESRD-hemoglobin, worsened by frequent blood draws in the hospital on top of ESRD causing anemia of chronic disease and marrow suppression, placed on PPI, oral iron, 1 unit of packed RBC transfusion on 03/19/20 as he is getting overall weak. Monitor H&H which is stable > 48  hrs - mild Haem Occ +ve with brown stools, on PPI, will have follow with GI Dr [name].  Had detailed discussion with patient's son bedside poor candidate for invasive procedures. For now supportive  care and monitor.  Orthostatic hypotension: Continue midodrine-follow.  Depression: Continue Celexa  History of dementia: Stable-encephalopathy improving-suspect mentation not far from usual baseline.  Gout: No flare-continue allopurinol    Diet:  Diet Order            DIET - DYS 1 Room service appropriate? No; Fluid consistency: Thin  Diet effective now               Code Status: Full code  Family Communication:  Patient's son Kwan [phone]) on 03/14/20, 03/15/20, called her son on 03/17/2020 at 10:26 AM, full voicemail, updated again over the phone on 03/18/20, updated in detail bedside 03/19/20, called updated 03/21/2020  Disposition Plan: Status is: Inpatient  Remains inpatient appropriate because:Altered mental status  Dispo: The patient is from: Home              Anticipated d/c is to: SNF              Anticipated d/c date is: As soon as SNF bed available - needs 24 hrs without a sitter              Patient currently is medically stable to d/c.   Barriers to Discharge: Hypoglycemia/ongoing encephalopathy/recurrent pleural effusion   Antimicrobial agents: Anti-infectives (From admission, onward)   Start     Dose/Rate Route Frequency Ordered Stop   03/10/20 2200  amoxicillin-clavulanate (AUGMENTIN) 500-125 MG per tablet 500 mg  Status:  Discontinued     500 mg Oral Daily at bedtime 03/10/20 1500 03/10/20 1752   03/10/20 1800  cefTRIAXone (ROCEPHIN) 2 g in sodium chloride 0.9 % 100 mL IVPB  Status:  Discontinued     2 g 200 mL/hr over 30 Minutes Intravenous Every 24 hours 03/10/20 1752 03/13/20 1421       Time spent:  25 minutes-Greater than 50% of this time was spent in counseling, explanation of diagnosis, planning of further management, and coordination of care.  MEDICATIONS:  Scheduled Meds:  . allopurinol  100 mg Oral Daily  . aspirin EC  81 mg Oral Daily  . calcitRIOL  1 mcg Oral Q M,W,F-HD  . carvedilol  3.125 mg Oral BID WC  .  Chlorhexidine Gluconate Cloth  6 each Topical Q0600  . cinacalcet  30 mg Oral Q M,W,F-HD  . citalopram  10 mg Oral Daily  . darbepoetin (ARANESP) injection - DIALYSIS  100 mcg Intravenous Q Fri-HD  . feeding supplement (NEPRO CARB STEADY)  237 mL Oral QHS  . feeding supplement (PRO-STAT SUGAR FREE 64)  30 mL Oral BID  . ferrous sulfate  325 mg Oral BID WC  . BioGaia Probiotic  5 drop Oral Q2000  . latanoprost  1 drop Both Eyes QHS  . liver oil-zinc oxide   Topical BID  . midodrine  10 mg Oral TID  . multivitamin  1 tablet Oral QHS  . pantoprazole  40 mg Oral BID  . predniSONE  50 mg Oral BID WC  . rosuvastatin  20 mg Oral QHS   Continuous Infusions: . chlorproMAZINE (THORAZINE) IV     PRN Meds:.acetaminophen **OR** [DISCONTINUED] acetaminophen, albuterol, chlorproMAZINE (THORAZINE) IV, nystatin, sodium chloride flush   PHYSICAL EXAM: Vital signs: Vitals:   03/20/20 0952 03/20/20 1706 03/20/20 2224 03/21/20 0623  BP: 133/63 138/70  129/66 139/71  Pulse: 85 77 75 79  Resp: 16 18 16 18   Temp: 98.4 F (36.9 C) 97.7 F (36.5 C) 97.8 F (36.6 C) 98.2 F (36.8 C)  TempSrc: Oral Oral Oral Oral  SpO2: 98% 97% 98% 98%  Weight:   64 kg   Height:       Filed Weights   03/18/20 0540 03/18/20 2023 03/20/20 2224  Weight: 66.7 kg 70.5 kg 64 kg   Body mass index is 20.82 kg/m.   EXAM  Awake pleasantly confused, No new F.N deficits, Normal affect [place].AT,PERRAL Supple Neck,No JVD, No cervical lymphadenopathy appriciated.  Symmetrical Chest wall movement, Good air movement bilaterally, CTAB RRR,No Gallops, Rubs or new Murmurs, No Parasternal Heave +ve B.Sounds, Abd Soft, No tenderness, No organomegaly appriciated, No rebound - guarding or rigidity. No Cyanosis, Clubbing or edema, No new Rash or bruise  I have personally reviewed following labs and imaging studies  LABORATORY DATA:  CBC: Recent Labs  Lab 03/15/20 0800 03/15/20 0800 03/16/20 0549 03/16/20 0549 03/17/20 1048  03/17/20 1048 03/18/20 1004 03/18/20 1004 03/19/20 1122 03/19/20 1407 03/20/20 0344 03/20/20 1038 03/21/20 0427  WBC 15.7*   < > 14.5*   < > 19.4*   < > 17.2*  --  16.4* 16.6*  --  15.5* 19.7*  NEUTROABS 12.4*  --  12.7*  --  16.8*  --  14.0*  --   --   --   --  12.8*  --   HGB 9.2*   < > 11.0*   < > 10.4*   < > 8.2*   < > 7.2* 7.5* 8.7* 8.7* 8.9*  HCT 30.0*   < > 35.3*   < > 33.6*   < > 26.1*   < > 22.3* 23.4* 27.4* 27.5* 28.0*  MCV 101.7*   < > 101.7*   < > 101.2*   < > 100.8*  --  100.5* 100.9*  --  98.9 98.2  PLT 292   < > 308   < > 356   < > 286  --  278 275  --  273 293   < > = values in this interval not displayed.    Basic Metabolic Panel: Recent Labs  Lab 03/15/20 0800 03/15/20 0800 03/16/20 0549 03/16/20 0549 03/17/20 1048 03/17/20 1600 03/18/20 0140 03/18/20 1004 03/19/20 1125 03/20/20 1038  NA 135   < > 136   < > 133*  --  134* 138 136 137  K 4.0   < > 4.3   < > 3.8  --  3.6 3.6 3.7 4.2  CL 95*   < > 96*   < > 96*  --  94* 102 98 99  CO2 30   < > 26   < > 20*  --  25 27 27 24   GLUCOSE 101*   < > 105*   < > 143*  --  126* 105* 101* 113*  BUN 49*   < > 29*   < > 57*  --  70* 30* 9 43*  CREATININE 6.85*   < > 4.00*   < > 6.33*  --  6.03* 3.00* 1.24 3.77*  CALCIUM 9.8   < > 9.3   < > 9.5  --  8.7* 8.6* 8.1* 9.0  MG 2.6*  --  2.2  --  2.1  --   --  1.9  --   --   PHOS  --   --   --   --   --  4.0 4.6  --  1.5* 4.3   < > = values in this interval not displayed.    GFR: Estimated Creatinine Clearance: 12.3 mL/min (A) (by C-G formula based on SCr of 3.77 mg/dL (H)).  Liver Function Tests: Recent Labs  Lab 03/15/20 0800 03/15/20 0800 03/16/20 0549 03/16/20 0549 03/17/20 1048 03/18/20 0140 03/18/20 1004 03/19/20 1125 03/20/20 1038  AST 47*  --  106*  --  92*  --  142*  --   --   ALT 28  --  70*  --  68*  --  119*  --   --   ALKPHOS 68  --  85  --  77  --  61  --   --   BILITOT 0.7  --  0.8  --  0.6  --  0.8  --   --   PROT 5.9*  --  6.9  --  6.6  --   5.9*  --   --   ALBUMIN 3.0*   < > 3.5   < > 3.5 3.0* 3.2* 3.2* 3.1*   < > = values in this interval not displayed.   No results for input(s): LIPASE, AMYLASE in the last 168 hours. Recent Labs  Lab 03/20/20 1038  AMMONIA 21    Coagulation Profile: No results for input(s): INR, PROTIME in the last 168 hours.  Cardiac Enzymes: No results for input(s): CKTOTAL, CKMB, CKMBINDEX, TROPONINI in the last 168 hours.  BNP (last 3 results) No results for input(s): PROBNP in the last 8760 hours.  Lipid Profile: No results for input(s): CHOL, HDL, LDLCALC, TRIG, CHOLHDL, LDLDIRECT in the last 72 hours.  Thyroid Function Tests: No results for input(s): TSH, T4TOTAL, FREET4, T3FREE, THYROIDAB in the last 72 hours.  Anemia Panel: No results for input(s): VITAMINB12, FOLATE, FERRITIN, TIBC, IRON, RETICCTPCT in the last 72 hours.  Urine analysis:    Component Value Date/Time   COLORURINE YELLOW 03/07/2020 1128   APPEARANCEUR HAZY (A) 03/07/2020 1128   LABSPEC 1.008 03/07/2020 1128   PHURINE 9.0 (H) 03/07/2020 1128   GLUCOSEU NEGATIVE 03/07/2020 1128   GLUCOSEU NEGATIVE 01/30/2020 1518   HGBUR SMALL (A) 03/07/2020 1128   BILIRUBINUR NEGATIVE 03/07/2020 1128   KETONESUR NEGATIVE 03/07/2020 1128   PROTEINUR 100 (A) 03/07/2020 1128   UROBILINOGEN 0.2 01/30/2020 1518   NITRITE NEGATIVE 03/07/2020 1128   LEUKOCYTESUR LARGE (A) 03/07/2020 1128    Sepsis Labs: Lactic Acid, Venous    Component Value Date/Time   LATICACIDVEN 0.66 12/08/2016 0605    MICROBIOLOGY: Recent Results (from the past 240 hour(s))  Body fluid culture     Status: None   Collection Time: 03/13/20  4:44 PM   Specimen: Pleura; Body Fluid  Result Value Ref Range Status   Specimen Description PLEURAL FLUID RIGHT  Final   Special Requests Normal  Final   Gram Stain   Final    CYTOSPIN SMEAR WBC PRESENT, PREDOMINANTLY MONONUCLEAR NO ORGANISMS SEEN    Culture   Final    NO GROWTH 3 DAYS Performed at [facility], [address] [place], [place] [address]    Report Status 03/16/2020 FINAL  Final    RADIOLOGY STUDIES/RESULTS: No results found.   LOS: 10 days   Signature  Lala Lund M.D on 03/21/2020 at 10:09 AM   -  To page go to [email or web address]

## 2020-03-21 NOTE — Progress Notes (Signed)
[facility] Progress Note   Subjective:  Seen in room - no overnight complaints. No CP/dyspnea. Looks like BS stable.  Objective Vitals:   03/20/20 0952 03/20/20 1706 03/20/20 2224 03/21/20 0623  BP: 133/63 138/70 129/66 139/71  Pulse: 85 77 75 79  Resp: 16 18 16 18   Temp: 98.4 F (36.9 C) 97.7 F (36.5 C) 97.8 F (36.6 C) 98.2 F (36.8 C)  TempSrc: Oral Oral Oral Oral  SpO2: 98% 97% 98% 98%  Weight:   64 kg   Height:       Physical Exam General: Frail appearing man, NAD.  Heart: RRR; no murmur Lungs: CTAB Abdomen: Non-tender Extremities: No LE edema. Bruising to L hip/thigh and L elbow Dialysis Access:  [facility]  Additional Objective Labs: Basic Metabolic Panel: Recent Labs  Lab 03/18/20 0140 03/18/20 0140 03/18/20 1004 03/19/20 1125 03/20/20 1038  NA 134*   < > 138 136 137  K 3.6   < > 3.6 3.7 4.2  CL 94*   < > 102 98 99  CO2 25   < > 27 27 24   GLUCOSE 126*   < > 105* 101* 113*  BUN 70*   < > 30* 9 43*  CREATININE 6.03*   < > 3.00* 1.24 3.77*  CALCIUM 8.7*   < > 8.6* 8.1* 9.0  PHOS 4.6  --   --  1.5* 4.3   < > = values in this interval not displayed.   Liver Function Tests: Recent Labs  Lab 03/16/20 0549 03/16/20 0549 03/17/20 1048 03/18/20 0140 03/18/20 1004 03/19/20 1125 03/20/20 1038  AST 106*  --  92*  --  142*  --   --   ALT 70*  --  68*  --  119*  --   --   ALKPHOS 85  --  77  --  61  --   --   BILITOT 0.8  --  0.6  --  0.8  --   --   PROT 6.9  --  6.6  --  5.9*  --   --   ALBUMIN 3.5   < > 3.5   < > 3.2* 3.2* 3.1*   < > = values in this interval not displayed.   CBC: Recent Labs  Lab 03/17/20 1048 03/17/20 1048 03/18/20 1004 03/18/20 1004 03/19/20 1122 03/19/20 1122 03/19/20 1407 03/19/20 1407 03/20/20 0344 03/20/20 1038 03/21/20 0427  WBC 19.4*   < > 17.2*   < > 16.4*   < > 16.6*  --   --  15.5* 19.7*  NEUTROABS 16.8*  --  14.0*  --   --   --   --   --   --  12.8*  --   HGB 10.4*   < > 8.2*   < > 7.2*   < > 7.5*    < > 8.7* 8.7* 8.9*  HCT 33.6*   < > 26.1*   < > 22.3*   < > 23.4*   < > 27.4* 27.5* 28.0*  MCV 101.2*   < > 100.8*  --  100.5*  --  100.9*  --   --  98.9 98.2  PLT 356   < > 286   < > 278   < > 275  --   --  273 293   < > = values in this interval not displayed.   CBG: Recent Labs  Lab 03/20/20 1116 03/20/20 1705 03/20/20 2202 03/21/20 0025 03/21/20 0263  GLUCAP 108* 116* 134* 123* 113*   Medications: . chlorproMAZINE (THORAZINE) IV     . allopurinol  100 mg Oral Daily  . aspirin EC  81 mg Oral Daily  . calcitRIOL  1 mcg Oral Q M,W,F-HD  . carvedilol  3.125 mg Oral BID WC  . Chlorhexidine Gluconate Cloth  6 each Topical Q0600  . cinacalcet  30 mg Oral Q M,W,F-HD  . citalopram  10 mg Oral Daily  . darbepoetin (ARANESP) injection - DIALYSIS  100 mcg Intravenous Q Fri-HD  . feeding supplement (NEPRO CARB STEADY)  237 mL Oral QHS  . feeding supplement (PRO-STAT SUGAR FREE 64)  30 mL Oral BID  . ferrous sulfate  325 mg Oral BID WC  . BioGaia Probiotic  5 drop Oral Q2000  . latanoprost  1 drop Both Eyes QHS  . liver oil-zinc oxide   Topical BID  . midodrine  10 mg Oral TID  . multivitamin  1 tablet Oral QHS  . pantoprazole  40 mg Oral BID  . predniSONE  50 mg Oral BID WC  . rosuvastatin  20 mg Oral QHS    Dialysis Orders: MWF at [facility] 4 hours, 400 BFR via TDC EDW 66 2K/2CaNo heparin - Calcitriol 1.5 mcg and sensipar 30 q HD, mircera 30  Assessment/Plan: 1.Confusion: Variable with baseline dementia. Hypoglycemic on arrival, + UTI -> treated with Ceftriaxone. Recent SDH, repeat CT head 5/5 redemonstrating R sided subdural with minimal decrease.  2.Hypoglycemia- per primary-->CT abd 5/10 with no obvious pancreatic lesion. ACTH normal, proinsulin pending, C-peptide(16.1) Insulin (21.8), sulfonylureasnegative/ND. ACTH stimulation test and TSH stable. On PO prednisone and to f/u as OP w/Endocrine.  3. Bilateral pleural effusions - s/p thoracentesis5/8. CT abd showed  no recurrence.  4. ESRD- Continue HD per MWF schedule - next Monday 5/17. 5. Anemiaof CKD -Hgb dropped 5/14, no bleeding noted. Aranesp 126mcg given. Hgb stable. 6. Secondary hyperparathyroidism - Ca/Phos ok. No binders. 7.HTN/volume- Blood pressuredrops during HD. On mido TID. Does not appear volume overloaded, but with ^ BNP + pleural effusions -> UF as tolerated. 8. Nutrition- Alb low - continue Nepro + pro-stat supplements. 9. Transaminitis: LFTs rising. Ammonia ok. Consider holding statin. 10. L elbow hematoma, L hip hematoma on CT   Katie Remee Charley, PA-C 03/21/2020, 9:47 AM  [facility]

## 2020-03-22 DIAGNOSIS — N390 Urinary tract infection, site not specified: Secondary | ICD-10-CM | POA: Diagnosis not present

## 2020-03-22 DIAGNOSIS — R600 Localized edema: Secondary | ICD-10-CM | POA: Diagnosis not present

## 2020-03-22 DIAGNOSIS — D6489 Other specified anemias: Secondary | ICD-10-CM | POA: Diagnosis not present

## 2020-03-22 DIAGNOSIS — M79632 Pain in left forearm: Secondary | ICD-10-CM | POA: Diagnosis not present

## 2020-03-22 DIAGNOSIS — F0390 Unspecified dementia without behavioral disturbance: Secondary | ICD-10-CM | POA: Diagnosis not present

## 2020-03-22 DIAGNOSIS — R41841 Cognitive communication deficit: Secondary | ICD-10-CM | POA: Diagnosis not present

## 2020-03-22 DIAGNOSIS — L02414 Cutaneous abscess of left upper limb: Secondary | ICD-10-CM | POA: Diagnosis not present

## 2020-03-22 DIAGNOSIS — Z992 Dependence on renal dialysis: Secondary | ICD-10-CM | POA: Diagnosis not present

## 2020-03-22 DIAGNOSIS — F39 Unspecified mood [affective] disorder: Secondary | ICD-10-CM | POA: Diagnosis not present

## 2020-03-22 DIAGNOSIS — R262 Difficulty in walking, not elsewhere classified: Secondary | ICD-10-CM | POA: Diagnosis not present

## 2020-03-22 DIAGNOSIS — R5381 Other malaise: Secondary | ICD-10-CM | POA: Diagnosis not present

## 2020-03-22 DIAGNOSIS — J690 Pneumonitis due to inhalation of food and vomit: Secondary | ICD-10-CM | POA: Diagnosis not present

## 2020-03-22 DIAGNOSIS — D631 Anemia in chronic kidney disease: Secondary | ICD-10-CM | POA: Diagnosis not present

## 2020-03-22 DIAGNOSIS — D649 Anemia, unspecified: Secondary | ICD-10-CM | POA: Diagnosis not present

## 2020-03-22 DIAGNOSIS — E161 Other hypoglycemia: Secondary | ICD-10-CM | POA: Diagnosis not present

## 2020-03-22 DIAGNOSIS — S065X9A Traumatic subdural hemorrhage with loss of consciousness of unspecified duration, initial encounter: Secondary | ICD-10-CM | POA: Diagnosis not present

## 2020-03-22 DIAGNOSIS — M2550 Pain in unspecified joint: Secondary | ICD-10-CM | POA: Diagnosis not present

## 2020-03-22 DIAGNOSIS — D638 Anemia in other chronic diseases classified elsewhere: Secondary | ICD-10-CM | POA: Diagnosis not present

## 2020-03-22 DIAGNOSIS — R1319 Other dysphagia: Secondary | ICD-10-CM | POA: Diagnosis not present

## 2020-03-22 DIAGNOSIS — I120 Hypertensive chronic kidney disease with stage 5 chronic kidney disease or end stage renal disease: Secondary | ICD-10-CM | POA: Diagnosis not present

## 2020-03-22 DIAGNOSIS — N250 Renal osteodystrophy: Secondary | ICD-10-CM | POA: Diagnosis not present

## 2020-03-22 DIAGNOSIS — M6281 Muscle weakness (generalized): Secondary | ICD-10-CM | POA: Diagnosis not present

## 2020-03-22 DIAGNOSIS — E162 Hypoglycemia, unspecified: Secondary | ICD-10-CM | POA: Diagnosis not present

## 2020-03-22 DIAGNOSIS — S8012XA Contusion of left lower leg, initial encounter: Secondary | ICD-10-CM | POA: Diagnosis not present

## 2020-03-22 DIAGNOSIS — R2681 Unsteadiness on feet: Secondary | ICD-10-CM | POA: Diagnosis not present

## 2020-03-22 DIAGNOSIS — R278 Other lack of coordination: Secondary | ICD-10-CM | POA: Diagnosis not present

## 2020-03-22 DIAGNOSIS — D72829 Elevated white blood cell count, unspecified: Secondary | ICD-10-CM | POA: Diagnosis not present

## 2020-03-22 DIAGNOSIS — I951 Orthostatic hypotension: Secondary | ICD-10-CM | POA: Diagnosis not present

## 2020-03-22 DIAGNOSIS — M25522 Pain in left elbow: Secondary | ICD-10-CM | POA: Diagnosis not present

## 2020-03-22 DIAGNOSIS — K5901 Slow transit constipation: Secondary | ICD-10-CM | POA: Diagnosis not present

## 2020-03-22 DIAGNOSIS — G9341 Metabolic encephalopathy: Secondary | ICD-10-CM | POA: Diagnosis not present

## 2020-03-22 DIAGNOSIS — I10 Essential (primary) hypertension: Secondary | ICD-10-CM | POA: Diagnosis not present

## 2020-03-22 DIAGNOSIS — N186 End stage renal disease: Secondary | ICD-10-CM | POA: Diagnosis not present

## 2020-03-22 DIAGNOSIS — E440 Moderate protein-calorie malnutrition: Secondary | ICD-10-CM | POA: Diagnosis not present

## 2020-03-22 DIAGNOSIS — Z7401 Bed confinement status: Secondary | ICD-10-CM | POA: Diagnosis not present

## 2020-03-22 DIAGNOSIS — R195 Other fecal abnormalities: Secondary | ICD-10-CM | POA: Diagnosis not present

## 2020-03-22 DIAGNOSIS — E8770 Fluid overload, unspecified: Secondary | ICD-10-CM | POA: Diagnosis not present

## 2020-03-22 DIAGNOSIS — R2689 Other abnormalities of gait and mobility: Secondary | ICD-10-CM | POA: Diagnosis not present

## 2020-03-22 DIAGNOSIS — N2581 Secondary hyperparathyroidism of renal origin: Secondary | ICD-10-CM | POA: Diagnosis not present

## 2020-03-22 DIAGNOSIS — R229 Localized swelling, mass and lump, unspecified: Secondary | ICD-10-CM | POA: Diagnosis not present

## 2020-03-22 DIAGNOSIS — F0280 Dementia in other diseases classified elsewhere without behavioral disturbance: Secondary | ICD-10-CM | POA: Diagnosis not present

## 2020-03-22 DIAGNOSIS — R1312 Dysphagia, oropharyngeal phase: Secondary | ICD-10-CM | POA: Diagnosis not present

## 2020-03-22 DIAGNOSIS — R531 Weakness: Secondary | ICD-10-CM | POA: Diagnosis not present

## 2020-03-22 LAB — RENAL FUNCTION PANEL
Albumin: 3 g/dL — ABNORMAL LOW (ref 3.5–5.0)
Anion gap: 18 — ABNORMAL HIGH (ref 5–15)
BUN: 102 mg/dL — ABNORMAL HIGH (ref 8–23)
CO2: 23 mmol/L (ref 22–32)
Calcium: 8.5 mg/dL — ABNORMAL LOW (ref 8.9–10.3)
Chloride: 97 mmol/L — ABNORMAL LOW (ref 98–111)
Creatinine, Ser: 6.85 mg/dL — ABNORMAL HIGH (ref 0.61–1.24)
GFR calc Af Amer: 8 mL/min — ABNORMAL LOW (ref >60)
GFR calc non Af Amer: 7 mL/min — ABNORMAL LOW (ref >60)
Glucose, Bld: 110 mg/dL — ABNORMAL HIGH (ref 70–99)
Phosphorus: 6.4 mg/dL — ABNORMAL HIGH (ref 2.5–4.6)
Potassium: 4.8 mmol/L (ref 3.5–5.1)
Sodium: 138 mmol/L (ref 135–145)

## 2020-03-22 LAB — GLUCOSE, CAPILLARY
Glucose-Capillary: 104 mg/dL — ABNORMAL HIGH (ref 70–99)
Glucose-Capillary: 105 mg/dL — ABNORMAL HIGH (ref 70–99)
Glucose-Capillary: 111 mg/dL — ABNORMAL HIGH (ref 70–99)
Glucose-Capillary: 133 mg/dL — ABNORMAL HIGH (ref 70–99)
Glucose-Capillary: 144 mg/dL — ABNORMAL HIGH (ref 70–99)
Glucose-Capillary: 69 mg/dL — ABNORMAL LOW (ref 70–99)
Glucose-Capillary: <10 mg/dL — CL (ref 70–99)

## 2020-03-22 LAB — CBC
HCT: 27.4 % — ABNORMAL LOW (ref 39.0–52.0)
Hemoglobin: 8.9 g/dL — ABNORMAL LOW (ref 13.0–17.0)
MCH: 32.1 pg (ref 26.0–34.0)
MCHC: 32.5 g/dL (ref 30.0–36.0)
MCV: 98.9 fL (ref 80.0–100.0)
Platelets: 318 10*3/uL (ref 150–400)
RBC: 2.77 MIL/uL — ABNORMAL LOW (ref 4.22–5.81)
RDW: 20.1 % — ABNORMAL HIGH (ref 11.5–15.5)
WBC: 15.5 10*3/uL — ABNORMAL HIGH (ref 4.0–10.5)
nRBC: 0.7 % — ABNORMAL HIGH (ref 0.0–0.2)

## 2020-03-22 LAB — HAPTOGLOBIN: Haptoglobin: 26 mg/dL — ABNORMAL LOW (ref 38–329)

## 2020-03-22 MED ORDER — RENA-VITE PO TABS: 1.0000 | ORAL_TABLET | Freq: Every day | ORAL | 0 refills | Status: AC

## 2020-03-22 MED ORDER — FERROUS SULFATE 325 (65 FE) MG PO TABS: 325.0000 mg | ORAL_TABLET | Freq: Two times a day (BID) | ORAL | 3 refills | Status: AC

## 2020-03-22 MED ORDER — PANTOPRAZOLE SODIUM 40 MG PO TBEC: 40.0000 mg | DELAYED_RELEASE_TABLET | Freq: Two times a day (BID) | ORAL | Status: AC

## 2020-03-22 MED ORDER — CINACALCET HCL 30 MG PO TABS
ORAL_TABLET | ORAL | Status: AC
  Filled 2020-03-22: qty 1

## 2020-03-22 MED ORDER — MIDODRINE HCL 5 MG PO TABS
ORAL_TABLET | ORAL | Status: AC
  Filled 2020-03-22: qty 2

## 2020-03-22 MED ORDER — HEPARIN SODIUM (PORCINE) 1000 UNIT/ML IJ SOLN
INTRAMUSCULAR | Status: AC
  Administered 2020-03-22: 3800 [IU]
  Filled 2020-03-22: qty 4

## 2020-03-22 MED ORDER — CALCITRIOL 0.5 MCG PO CAPS
ORAL_CAPSULE | ORAL | Status: AC
  Filled 2020-03-22: qty 2

## 2020-03-22 MED ORDER — CARVEDILOL 3.125 MG PO TABS: 3.1250 mg | ORAL_TABLET | Freq: Two times a day (BID) | ORAL | Status: AC

## 2020-03-22 MED ORDER — PREDNISONE 50 MG PO TABS: 50.0000 mg | ORAL_TABLET | Freq: Two times a day (BID) | ORAL | Status: AC

## 2020-03-22 MED ORDER — DOCUSATE SODIUM 100 MG PO CAPS
200.0000 mg | ORAL_CAPSULE | Freq: Two times a day (BID) | ORAL | 0 refills | Status: AC
Start: 2020-03-22 — End: 2020-04-21

## 2020-03-22 NOTE — Discharge Summary (Addendum)
Gabriel Morella Wegman Sr. QPY:[id number] DOB: 17-May-1929 DOA: 03/10/2020  PCP: Gabriel Borg, MD  Admit date: 03/10/2020  Discharge date: 03/22/2020  Admitted From: Home   Disposition:  SNF   Recommendations for Outpatient Follow-up:   Follow up with PCP in 1-2 weeks  PCP Please obtain BMP/CBC, 2 view CXR in 1week,  (see Discharge instructions)   PCP Please follow up on the following pending results: Monitor CBGs QA CHS, needs outpatient follow-up with endocrine Dr. Renato Wise within a week, check CBC in 10 days.   Home Health: None   Equipment/Devices: None  Consultations: Nephrology, endocrine Dr. Loanne Wise over the phone Discharge Condition:  Fair   CODE STATUS: Full    Diet Recommendation: Dysphagia 1 diet with feeding assistance and aspiration precautions.  Diet Order            DIET - DYS 1 Room service appropriate? No; Fluid consistency: Thin  Diet effective now               Chief Complaint  Patient presents with  . Altered Mental Status     Brief history of present illness from the day of admission and additional interim summary    Patient is a 86 y.o. male with history of ESRD on HD MWF, HLD, anemia of chronic disease, orthostatic hypotension, depression/dementia who was found to be confused (more than usual) at HD and sent to the hospital for further evaluation, work-up suggested persistent hypoglycemia..  Significant events:  5/5>> readmit to [facility] for evaluation of encephalopathy/hypoglycemia. 5/1-5/4, 5/14>> admit to [facility]-frequent falls-found to have SDH, left hip hematoma, acute blood loss anemia requiring 2 units of PRBC    Procedures :  5/8 >>  Right-sided ultrasound-guided thoracentesis by pulmonary critical care, transudative fluid  5/8>> CT chest: Slight enlargement of bilateral  pleural effusions, compressive atelectasis bilateral lower lobes 5/8>> CT head: Minimal decrease in [facility]                                                                 Hospital Course    Recurrent hypoglycemia: Continues to recur-etiology uncertain -  suspect that this is from frailty/poor oral intake/underlying ESRD.  But given recurrent hypoglycemia (was also occurring last admission) - pending proinsulin/insulin ratio.  ACTH 31, negative sulfonylureas level random insulin 21.8 with elevated C-peptide levels, Stable A1c along with stable TSH, stable ACTH stimulation test.  C-peptide was quite high  although he was on low-dose D5 drip at that time however it still quite significant, stable CT abdomen pelvis with and without contrast to rules out an obvious pancreatic lesion, discussed with endocrinologist Dr. Renato Wise on 03/15/2020, sugars have improved once he has been placed on twice daily prednisone which will be continued, please make sure he is not n.p.o. for prolonged.  He  must follow-up with Dr. Franco Wise endocrinologist within a week for further work-up and management of his persistent hypoglycemia with relatively high C-peptide.   Acute metabolic encephalopathy: Does have underlying dementia-but mental status has improved compared to on admission.  Suspect etiology of encephalopathy to be a combination of hypoglycemia, recent SDH/ongoing UTI/infection.  CT head on admission showed decreased size of the SDH.  Encephalopathy is thought now to be due to persistent hypoglycemia.  With correction of hypoglycemia he is close to his baseline and pleasantly confused.    Complicated UTI/ ?Asp PNA with persistent bilateral pleural effusion (right>>left): Was adequately treated with antibiotics, underwent diagnostic right sided thoracentesis showing transudative fluid suggestive of volume overload due to missed dialysis.  Clinically much improved.  Speech following for underlying mild dysphagia.   Currently no signs of active infection.  Continue dysphagia 1 diet with feeding assistance and aspiration precautions and continue speech therapy follow-up at SNF.  ESRD on HD MWF: Nephrology following and directing care, add scheduled will be dialyzed today prior to discharge.  Recent SDH: Following a mechanical fall (last admission)-repeat CT head this admission shows decreased size of SDH.  Recent left hip hematoma: Following a mechanical fall (last admission)- supportive care.  Hemoglobin currently stable-Did require 2 Units of PRBC transfusion last admission (03/09/20).  AOCD: Related to ESRD-hemoglobin, worsened by frequent blood draws in the hospital on top of ESRD causing anemia of chronic disease and marrow suppression, placed on PPI, oral iron, 1 unit of packed RBC transfusion on 03/19/20 as he is getting overall weak. Monitor H&H which is stable > 72 hrs - mild Haem Occ +ve with brown stools, on PPI, will have follow with GI Dr [name].  Had detailed discussion with patient's son bedside poor candidate for invasive procedures. For now supportive care and monitor.  Orthostatic hypotension: Continue midodrine-follow.  Depression: Continue Celexa  History of dementia: Stable-encephalopathy improving-suspect mentation not far from usual baseline.  Gout: No flare-continue allopurinol   Discharge diagnosis     Principal Problem:   Hypoglycemia Active Problems:   Depression   ESRD on dialysis (HCC)   Orthostatic hypotension   Anemia of chronic disease   Subdural hematoma (HCC)   Acute lower UTI   Pleural effusion   Malnutrition of moderate degree    Discharge instructions    Discharge Instructions    Discharge instructions   Complete by: As directed    Follow with Primary MD Gabriel Borg, MD in 7 days   Get CBC, CMP  checked next visit within 1 week by Primary MD or SNF MD    Activity: As tolerated with Full fall precautions use walker/cane & assistance as  needed  Disposition SNF  Diet: Dysphagia 1 diet with feeding assistance and aspiration precautions.  Special Instructions: If you have smoked or chewed Tobacco  in the last 2 yrs please stop smoking, stop any regular Alcohol  and or any Recreational drug use.  On your next visit with your primary care physician please Get Medicines reviewed and adjusted.  Please request your Prim.MD to go over all Hospital Tests and Procedure/Radiological results at the follow up, please get all Hospital records sent to your Prim MD by signing hospital release before you go home.  If you experience worsening of your admission symptoms, develop shortness of breath, life threatening emergency, suicidal or homicidal thoughts you must seek medical attention immediately by calling 911 or calling your MD immediately  if symptoms less severe.  You Must read complete instructions/literature along with all the possible adverse reactions/side effects for all the Medicines you take and that have been prescribed to you. Take any new Medicines after you have completely understood and accpet all the possible adverse reactions/side effects.   Increase activity slowly   Complete by: As directed       Discharge Medications   Allergies as of 03/22/2020   No Known Allergies     Medication List    STOP taking these medications   amoxicillin-clavulanate 500-125 MG tablet Commonly known as: AUGMENTIN     TAKE these medications   allopurinol 100 MG tablet Commonly known as: ZYLOPRIM TAKE 1 TABLET BY MOUTH EVERY DAY   aspirin EC 81 MG tablet Take 1 tablet (81 mg total) by mouth daily.   Auryxia 1 GM 210 MG(Fe) tablet Generic drug: ferric citrate Take 420 mg by mouth 3 (three) times daily with meals.   calcitRIOL 0.5 MCG capsule Commonly known as: ROCALTROL Take 3 capsules (1.5 mcg total) by mouth every Monday, Wednesday, and Friday with hemodialysis.   carvedilol 3.125 MG tablet Commonly known as:  COREG Take 1 tablet (3.125 mg total) by mouth 2 (two) times daily with a meal.   cinacalcet 30 MG tablet Commonly known as: SENSIPAR Take 1 tablet (30 mg total) by mouth every Monday, Wednesday, and Friday with hemodialysis.   citalopram 10 MG tablet Commonly known as: CELEXA Take 1 tablet (10 mg total) by mouth daily.   Darbepoetin Alfa 60 MCG/0.3ML Sosy injection Commonly known as: ARANESP Inject 0.3 mLs (60 mcg total) into the vein every Monday with hemodialysis.   docusate sodium 100 MG capsule Commonly known as: Colace Take 2 capsules (200 mg total) by mouth 2 (two) times daily.   ferrous sulfate 325 (65 FE) MG tablet Take 1 tablet (325 mg total) by mouth 2 (two) times daily with a meal.   latanoprost 0.005 % ophthalmic solution Commonly known as: XALATAN Place 1 drop into both eyes at bedtime.   lidocaine-prilocaine cream Commonly known as: EMLA Apply 1 application topically See admin instructions. Apply to access site 1 hour before dialysis. Cover with saran wrap   midodrine 10 MG tablet Commonly known as: PROAMATINE Take 1 tablet (10 mg total) by mouth 3 (three) times daily. Take in the morning, lunch time and 4pm. Do not take after 4pm.   multivitamin Tabs tablet Take 1 tablet by mouth at bedtime.   multivitamin with minerals Tabs tablet Take 1 tablet by mouth daily.   nystatin powder Commonly known as: MYCOSTATIN/NYSTOP Use as directed twice per day What changed:   how much to take  how to take this  when to take this  reasons to take this  additional instructions   pantoprazole 40 MG tablet Commonly known as: PROTONIX Take 1 tablet (40 mg total) by mouth 2 (two) times daily.   predniSONE 50 MG tablet Commonly known as: DELTASONE Take 1 tablet (50 mg total) by mouth 2 (two) times daily with a meal.   rosuvastatin 20 MG tablet Commonly known as: CRESTOR Take 1 tablet (20 mg total) by mouth at bedtime.   Vitamin D (Ergocalciferol) 1.25 MG  (50000 UNIT) Caps capsule Commonly known as: DRISDOL Take 1 capsule (50,000 Units total) by mouth every 7 (seven) days.        Contact information for follow-up providers    Gabriel Borg, MD. Schedule an appointment as soon as possible for a visit in 1 week(s).  Specialties: Internal Medicine, Radiology Contact information: [place] [address] [phone]        Gabriel Shin, MD. Schedule an appointment as soon as possible for a visit in 1 week(s).   Specialty: Endocrinology Why: Hypoglycemia with high CRP Contact information: [address] [place] [address] [phone]        Gatha Mayer, MD. Schedule an appointment as soon as possible for a visit in 1 week(s).   Specialty: Gastroenterology Why: Hemoccult positive stool with persistent anemia Contact information: [address] [place] [address] [phone]            Contact information for after-discharge care    Destination    [facility] .   Service: Skilled Nursing Contact information: [place] [phone]                  Major procedures and Radiology Reports - PLEASE review detailed and final reports thoroughly  -       CT ABDOMEN PELVIS W WO CONTRAST  Result Date: 03/15/2020 CLINICAL DATA:  Question insulinoma, end-stage renal disease. EXAM: CT ABDOMEN AND PELVIS WITHOUT AND WITH CONTRAST TECHNIQUE: Multidetector CT imaging of the abdomen and pelvis was performed following the standard protocol before and following the bolus administration of intravenous contrast. CONTRAST:  125mL OMNIPAQUE IOHEXOL 300 MG/ML  SOLN COMPARISON:  03/06/2020 FINDINGS: Lower chest: Small effusions, diminished on the RIGHT when compared to previous study from 03/06/2020. Basilar atelectasis. Mitral annular calcification. Heart size enlarged. Hepatobiliary: No focal hepatic lesion aside from a large  hepatic cyst arising from the posterior RIGHT hepatic lobe. The portal vein is patent. No gross biliary ductal dilation. Pancreas: Assessment of the pancreas is limited by respiratory motion. No focal, enhancing hepatic lesion or sign of ductal dilation within the limitations of the exam. Spleen: Spleen is normal size without focal lesion. Adrenals/Urinary Tract: Low-density LEFT adrenal lesion could potentially even represent a LEFT adrenal cyst evaluation hampered by motion more likely a LEFT adrenal adenoma with variable fat density measuring 2 x 1.7 cm within 1 mm of previous imaging. RIGHT adrenal is normal. Low-density renal lesions compatible with cysts bilaterally more on the LEFT than the RIGHT. No hydronephrosis. No nephrolithiasis. Stomach/Bowel: Moderate to marked gastric distension. No acute small bowel process. The appendix with small appendicular lith otherwise unremarkable. Colon is full of liquid stool and gas without abrupt transition. Scattered colonic diverticulosis. Vascular/Lymphatic: Calcified and noncalcified atheromatous plaque in the abdominal aorta with tortuous iliac vessels. Mild dilation of the LEFT common iliac artery at 1.5 cm. No retroperitoneal or upper abdominal lymphadenopathy. Reproductive: Post hysterectomy. Other: No signs of abdominal wall hernia or abdominal fluid collection. Musculoskeletal: Fluid collection over LEFT hip compatible with evolving hematoma / morell lavellee lesion. Signs of LEFT hip arthroplasty. Spinal degenerative changes. IMPRESSION: 1. No visible pancreatic lesion. Study limited by some motion artifact. 2. LEFT adrenal adenoma, evaluation hampered by motion. Similar to prior studies. 3. Small bilateral pleural effusions, diminished on the RIGHT when compared to previous study. Basilar atelectasis. 4. Moderate to marked gastric distension, new from the prior exam. 5. Fluid collection over LEFT hip compatible with evolving hematoma / morell lavellee lesion.  6. Bilateral renal cysts. 7. Hepatic cyst as before. 8. Aortic atherosclerosis. Aortic Atherosclerosis (ICD10-I70.0). Electronically Signed   By: Zetta Bills M.D.   On: 03/15/2020 19:28   CT ABDOMEN PELVIS WO CONTRAST  Result Date:  03/06/2020 CLINICAL DATA:  Increasing falls. Swelling to the left arm and bruising over the right hip. Generalized weakness. EXAM: CT CHEST, ABDOMEN AND PELVIS WITHOUT CONTRAST TECHNIQUE: Multidetector CT imaging of the chest, abdomen and pelvis was performed following the standard protocol without IV contrast. COMPARISON:  November 10, 2019 FINDINGS: CT CHEST FINDINGS Cardiovascular: The heart is enlarged. There is no significant pericardial effusion. The intracardiac blood pool is hypodense relative to the adjacent myocardium consistent with anemia. There are atherosclerotic changes of the thoracic aorta without evidence for an aneurysm. There is a well-positioned tunneled dialysis catheter with tip terminating in the high right atrium. Mediastinum/Nodes: --No mediastinal or hilar lymphadenopathy. --No axillary lymphadenopathy. --No supraclavicular lymphadenopathy. --Normal thyroid gland. --The esophagus is unremarkable Lungs/Pleura: There are moderate-sized bilateral pleural effusions, right greater than left. There is interlobular septal thickening bilaterally. There is a ground-glass airspace opacity in the right upper lobe (axial series 5, image 59). There is no pneumothorax. There is some bronchial wall thickening and mucus plugging at the lung bases bilaterally. The trachea is unremarkable. Musculoskeletal: There are healing left-sided rib fractures. CT ABDOMEN PELVIS FINDINGS Hepatobiliary: There is a large cyst in the right hepatic lobe which is stable from prior study. Normal gallbladder.There is no biliary ductal dilation. Pancreas: Normal contours without ductal dilatation. No peripancreatic fluid collection. Spleen: Unremarkable. Adrenals/Urinary Tract: --Adrenal glands:  There is a stable benign lipid rich left adrenal adenoma. --Right kidney/ureter: The right kidney is atrophic. --Left kidney/ureter: The left kidney is atrophic. --Urinary bladder: Unremarkable. Stomach/Bowel: --Stomach/Duodenum: No hiatal hernia or other gastric abnormality. Normal duodenal course and caliber. --Small bowel: Unremarkable. --Colon: There is scattered colonic diverticula without CT evidence for diverticulitis. --Appendix: Normal. Vascular/Lymphatic: Atherosclerotic calcification is present within the non-aneurysmal abdominal aorta, without hemodynamically significant stenosis. --No retroperitoneal lymphadenopathy. --No mesenteric lymphadenopathy. --No pelvic or inguinal lymphadenopathy. Reproductive: Unremarkable Other: There is a small amount of free fluid in the patient's abdomen and pelvis. There is no free air. Musculoskeletal. There is a hematoma overlying the proximal lateral left hip measuring approximately 5.8 x 3.7 cm. The patient is status post total hip arthroplasty on the left. The hardware appears intact where visualized. There is a chronic appearing fracture of the superior pubic ramus on the left. IMPRESSION: 1. Moderate-sized bilateral pleural effusions, right greater than left, with interlobular septal thickening, suggestive of interstitial pulmonary edema. 2. Ground-glass airspace opacity in the right upper lobe, concerning for pneumonia. 3. Status post left total hip arthroplasty. There is a 5.8 x 3.7 cm hematoma overlying the proximal lateral left hip. 4. Atrophic kidneys. 5. Colonic diverticulosis without CT evidence for diverticulitis. Aortic Atherosclerosis (ICD10-I70.0). Electronically Signed   By: Constance Holster M.D.   On: 03/06/2020 19:20   DG Chest 2 View  Result Date: 03/06/2020 CLINICAL DATA:  Falls over the past week.  Altered mental status. EXAM: CHEST - 2 VIEW COMPARISON:  12/08/2016 FINDINGS: Right IJ central venous catheter with tip over the SVC. Vascular  stent projects over the left subclavian region and also over the medial left upper arm. Lungs are adequately inflated with minimal layering bilateral pleural fluid. No definite focal airspace process. No pneumothorax. Borderline stable cardiomegaly. No acute fracture. IMPRESSION: 1.  Small bilateral layering pleural effusions. 2. Stable borderline cardiomegaly. Right IJ central venous catheter with tip over the SVC. Electronically Signed   By: Marin Olp M.D.   On: 03/06/2020 14:54   DG Elbow Complete Left  Result Date: 03/06/2020 CLINICAL DATA:  Several falls over the  past week with left elbow pain. EXAM: LEFT ELBOW - COMPLETE 3+ VIEW COMPARISON:  None. FINDINGS: Minimal degenerative changes over the elbow. Displaced anterior fat pad likely joint effusion. No definite fracture visualized. Partial visualization of a vascular stent over the medial upper arm. Surgical clips over the soft tissues of the elbow. IMPRESSION: Joint effusion.  No definite fracture visualized. Electronically Signed   By: Marin Olp M.D.   On: 03/06/2020 14:57   CT Head Wo Contrast  Result Date: 03/10/2020 CLINICAL DATA:  Evaluate right subdural hematoma EXAM: CT HEAD WITHOUT CONTRAST TECHNIQUE: Contiguous axial images were obtained from the base of the skull through the vertex without intravenous contrast. COMPARISON:  03/06/2020 FINDINGS: Technical note: Examination degraded by motion artifact. Brain: Redemonstration of a slightly hyperdense extra-axial collection overlying the right cerebral convexity, which appears minimally decreased in size compared to previous exam. Collection measures up to 8 mm in maximal thickness (series 10, image 25), previously measured 10 mm when remeasured at a similar location. No significant shift of the midline structures. No evidence of new large territory infarction. No hydrocephalus. Extensive low-density changes within the periventricular and subcortical white matter compatible with chronic  microvascular ischemic change. Moderate diffuse cerebral volume loss. Vascular: Atherosclerotic calcifications involving the large vessels of the skull base. No unexpected hyperdense vessel. Skull: Negative for calvarial fracture. Sinuses/Orbits: Paranasal sinuses are clear. Chronic soft tissue thickening and erosion of the external auditory canal. Other: None. IMPRESSION: 1. Redemonstration of a right-sided subdural hematoma, which appears minimally decreased in size compared to previous exam. No significant shift of the midline structures. 2. Extensive low-density changes within the periventricular and subcortical white matter compatible with chronic microvascular ischemic change. 3. Moderate diffuse cerebral volume loss. 4. Soft tissue thickening with bony erosion at the right external auditory canal, similar in appearance to temporal bone CT of 08/19/2019. Electronically Signed   By: Davina Poke D.O.   On: 03/10/2020 13:19   CT Head Wo Contrast  Result Date: 03/06/2020 CLINICAL DATA:  Altered mental status. Increasing phones. EXAM: CT HEAD WITHOUT CONTRAST TECHNIQUE: Contiguous axial images were obtained from the base of the skull through the vertex without intravenous contrast. COMPARISON:  MRI brain from 07/05/2015 FINDINGS: Brain: Small chronic lacunar infarct in the head of the right caudate nucleus. Periventricular white matter and corona radiata hypodensities favor chronic ischemic microvascular white matter disease. New right frontoparietal temporal subacute subdural hematoma with lower density component anteriorly and higher density component posteriorly. Anterior components up to about 8 mm in thickness. No significant degree of midline shift. No appreciable acute CVA or mass lesion. Vascular: There is atherosclerotic calcification of the cavernous carotid arteries bilaterally. Skull: Unremarkable Sinuses/Orbits: As on the prior temporal bone CT of 08/09/2019, there is abnormal thickening of the  osseous portion of the external auditory canal with some bony erosions. Other: No supplemental non-categorized findings. IMPRESSION: 1. Subacute right frontoparietotemporal subdural hematoma, 8 mm in thickness. No significant degree of midline shift. 2. Periventricular white matter and corona radiata hypodensities favor chronic ischemic microvascular white matter disease. 3. Small chronic lacunar infarct in the head of the right caudate nucleus. 4. Abnormal thickening of the osseous portion of the right external auditory canal with some bony erosions, query external auditory canal cholesteatoma. This is similar to that shown of the temporal bone CT of 08/19/2019. Electronically Signed   By: Van Clines M.D.   On: 03/06/2020 16:06   CT CHEST WO CONTRAST  Result Date: 03/13/2020 CLINICAL DATA:  Bilateral pleural effusions, abnormal chest x-ray EXAM: CT CHEST WITHOUT CONTRAST TECHNIQUE: Multidetector CT imaging of the chest was performed following the standard protocol without IV contrast. COMPARISON:  03/06/2020 FINDINGS: Cardiovascular: Heart is stable without pericardial effusion. Extensive calcification of the mitral and aortic valves. Stable atherosclerosis of the thoracic aorta and coronary vessels. Mediastinum/Nodes: No enlarged mediastinal or axillary lymph nodes. Thyroid gland, trachea, and esophagus demonstrate no significant findings. Lungs/Pleura: There are small bilateral pleural effusions, right greater than left. Overall, slight increased since prior study, estimated less than 500 cc on the left and less than 1 L on the right. Dependent atelectasis within the bilateral lower lobes. No airspace disease or pneumothorax. The central airways are patent. Upper Abdomen: Stable appearance of the upper abdomen. Musculoskeletal: No acute or destructive bony lesions. Reconstructed images demonstrate no additional findings. There is a right internal jugular catheter tip within the superior vena cava.  IMPRESSION: 1. Slight enlargement of bilateral pleural effusions, right greater than left. 2. Compressive atelectasis bilateral lower lobes. 3.  Aortic Atherosclerosis (ICD10-I70.0). Electronically Signed   By: Randa Ngo M.D.   On: 03/13/2020 00:32   CT CHEST WO CONTRAST  Result Date: 03/06/2020 CLINICAL DATA:  Increasing falls. Swelling to the left arm and bruising over the right hip. Generalized weakness. EXAM: CT CHEST, ABDOMEN AND PELVIS WITHOUT CONTRAST TECHNIQUE: Multidetector CT imaging of the chest, abdomen and pelvis was performed following the standard protocol without IV contrast. COMPARISON:  November 10, 2019 FINDINGS: CT CHEST FINDINGS Cardiovascular: The heart is enlarged. There is no significant pericardial effusion. The intracardiac blood pool is hypodense relative to the adjacent myocardium consistent with anemia. There are atherosclerotic changes of the thoracic aorta without evidence for an aneurysm. There is a well-positioned tunneled dialysis catheter with tip terminating in the high right atrium. Mediastinum/Nodes: --No mediastinal or hilar lymphadenopathy. --No axillary lymphadenopathy. --No supraclavicular lymphadenopathy. --Normal thyroid gland. --The esophagus is unremarkable Lungs/Pleura: There are moderate-sized bilateral pleural effusions, right greater than left. There is interlobular septal thickening bilaterally. There is a ground-glass airspace opacity in the right upper lobe (axial series 5, image 59). There is no pneumothorax. There is some bronchial wall thickening and mucus plugging at the lung bases bilaterally. The trachea is unremarkable. Musculoskeletal: There are healing left-sided rib fractures. CT ABDOMEN PELVIS FINDINGS Hepatobiliary: There is a large cyst in the right hepatic lobe which is stable from prior study. Normal gallbladder.There is no biliary ductal dilation. Pancreas: Normal contours without ductal dilatation. No peripancreatic fluid collection. Spleen:  Unremarkable. Adrenals/Urinary Tract: --Adrenal glands: There is a stable benign lipid rich left adrenal adenoma. --Right kidney/ureter: The right kidney is atrophic. --Left kidney/ureter: The left kidney is atrophic. --Urinary bladder: Unremarkable. Stomach/Bowel: --Stomach/Duodenum: No hiatal hernia or other gastric abnormality. Normal duodenal course and caliber. --Small bowel: Unremarkable. --Colon: There is scattered colonic diverticula without CT evidence for diverticulitis. --Appendix: Normal. Vascular/Lymphatic: Atherosclerotic calcification is present within the non-aneurysmal abdominal aorta, without hemodynamically significant stenosis. --No retroperitoneal lymphadenopathy. --No mesenteric lymphadenopathy. --No pelvic or inguinal lymphadenopathy. Reproductive: Unremarkable Other: There is a small amount of free fluid in the patient's abdomen and pelvis. There is no free air. Musculoskeletal. There is a hematoma overlying the proximal lateral left hip measuring approximately 5.8 x 3.7 cm. The patient is status post total hip arthroplasty on the left. The hardware appears intact where visualized. There is a chronic appearing fracture of the superior pubic ramus on the left. IMPRESSION: 1. Moderate-sized bilateral pleural effusions, right greater than left, with  interlobular septal thickening, suggestive of interstitial pulmonary edema. 2. Ground-glass airspace opacity in the right upper lobe, concerning for pneumonia. 3. Status post left total hip arthroplasty. There is a 5.8 x 3.7 cm hematoma overlying the proximal lateral left hip. 4. Atrophic kidneys. 5. Colonic diverticulosis without CT evidence for diverticulitis. Aortic Atherosclerosis (ICD10-I70.0). Electronically Signed   By: Constance Holster M.D.   On: 03/06/2020 19:20   CT Cervical Spine Wo Contrast  Result Date: 03/06/2020 CLINICAL DATA:  Fall, altered mental status. EXAM: CT CERVICAL SPINE WITHOUT CONTRAST TECHNIQUE: Multidetector CT  imaging of the cervical spine was performed without intravenous contrast. Multiplanar CT image reconstructions were also generated. COMPARISON:  CT cervical spine 08/17/2017 FINDINGS: Alignment: Continued abnormal predental space, with 5 mm of space between the anterior arch of C1 in the odontoid, previously 4.5 mm back on 08/17/2017 by my measurements. The appearance suggests some degree of atlantoaxial instability, but clearly seems to be a chronic finding. There is chronic grade 1 mild posterior subluxation at C3-4. Skull base and vertebrae: Acquired interbody fusions likely through fused spurring at C3-4, C4-5, C5-6, and potentially C7-T1. Fused facet joints on the left at C4-5 and C5-6. No acute fracture is identified. Severe spondylosis noted. Soft tissues and spinal canal: There is likely at least mild central narrowing of the thecal sac at the C1 level due to the atlantoaxial subluxation and a small amount of pannus posterior to the odontoid. Right central line noted. Bilateral common carotid atherosclerotic calcification. Disc levels: Uncinate and facet spurring causes right foraminal impingement at C2-3, C3-4, C6-7, and potentially C7-T1; and left foraminal impingement at C3-4, C4-5, and C7-T1. Upper chest: Branch vessel atherosclerotic vascular calcification. Suspected right pleural effusion. Other: No supplemental non-categorized findings. IMPRESSION: 1. No acute cervical spine findings. 2. Continued abnormal predental space, with 5 mm of space between the anterior arch of C1 in the odontoid, previously 4.5 mm back on 08/17/2017 by my measurements. The appearance suggests chronic atlantoaxial instability/subluxation, but clearly seems to be a chronic finding. This is likely contributing to central narrowing of the thecal sac at the C1 level. 3. Severe spondylosis causing multilevel impingement. 4. Suspected right pleural effusion. 5. Atherosclerosis. Critical Value/emergent results were called by  telephone at the time of interpretation on 03/06/2020 at 4:14 pm to provider Dr, Eugenio Hoes, who verbally acknowledged these results. In particular we discussed the cervical spine findings and also the intracranial findings including the presence of the subacute right subdural hematoma. Electronically Signed   By: Van Clines M.D.   On: 03/06/2020 16:14   DG Chest Port 1 View  Result Date: 03/15/2020 CLINICAL DATA:  86 year old male with history of shortness of breath. History of end-stage renal disease. EXAM: PORTABLE CHEST 1 VIEW COMPARISON:  Chest x-ray 03/13/2020. FINDINGS: Right internal jugular PermCath with tip terminating in the right atrium. Lung volumes are slightly low. Opacity at the left base obscuring the left hemidiaphragm which may reflect atelectasis and/or consolidation. No definite pleural effusions. No evidence of pulmonary edema. Mild cardiomegaly. Upper mediastinal contours are within normal limits. Aortic atherosclerosis. IMPRESSION: 1. Support apparatus, as above. 2. Atelectasis and/or consolidation in the left lower lobe. 3. Aortic atherosclerosis. Electronically Signed   By: Vinnie Langton M.D.   On: 03/15/2020 08:13   DG Chest Port 1 View  Result Date: 03/13/2020 CLINICAL DATA:  Status post thoracentesis EXAM: PORTABLE CHEST 1 VIEW COMPARISON:  Mar 10, 2020 FINDINGS: There is a well-positioned tunneled dialysis catheter. The right-sided pleural effusion has  decreased in size since the prior study. There is no evidence for a right-sided pneumothorax. There may be a small left-sided pleural effusion. There is bibasilar atelectasis. The heart size remains enlarged. A vascular stent again projects over the left lung apex. Atherosclerotic changes are noted of the thoracic aorta. IMPRESSION: 1. No pneumothorax status post thoracentesis. 2. Bibasilar atelectasis. 3. Stable cardiomegaly. Electronically Signed   By: Constance Holster M.D.   On: 03/13/2020 18:19   DG CHEST PORT 1  VIEW  Result Date: 03/10/2020 CLINICAL DATA:  Altered mental status. EXAM: PORTABLE CHEST 1 VIEW COMPARISON:  Mar 06, 2020. FINDINGS: Stable cardiomegaly. Right internal jugular dialysis catheter is unchanged in position. No pneumothorax is noted. Mild bibasilar opacities are noted concerning for atelectasis or edema with associated pleural effusions. Bony thorax is unremarkable. Atherosclerosis of thoracic aorta is noted. IMPRESSION: Mild bibasilar opacities are noted concerning for atelectasis or edema with associated pleural effusions. Aortic Atherosclerosis (ICD10-I70.0). Electronically Signed   By: Marijo Conception M.D.   On: 03/10/2020 18:17   DG Hips Bilat W or Wo Pelvis 5 Views  Result Date: 03/06/2020 CLINICAL DATA:  Recent falls with right hip pain. EXAM: DG HIP (WITH OR WITHOUT PELVIS) 5+V BILAT COMPARISON:  02/23/2015 FINDINGS: Left hip arthroplasty intact and unchanged. Mild degenerative changes of the right hip. Degenerative change of the spine and sacroiliac joints. No acute fracture or dislocation. IMPRESSION: No acute findings. Electronically Signed   By: Marin Olp M.D.   On: 03/06/2020 14:55    Micro Results     Recent Results (from the past 240 hour(s))  Body fluid culture     Status: None   Collection Time: 03/13/20  4:44 PM   Specimen: Pleura; Body Fluid  Result Value Ref Range Status   Specimen Description PLEURAL FLUID RIGHT  Final   Special Requests Normal  Final   Gram Stain   Final    CYTOSPIN SMEAR WBC PRESENT, PREDOMINANTLY MONONUCLEAR NO ORGANISMS SEEN    Culture   Final    NO GROWTH 3 DAYS Performed at [facility], [address] [place], [place] [address]    Report Status 03/16/2020 FINAL  Final  SARS CORONAVIRUS 2 (TAT 6-24 HRS) Nasopharyngeal Nasopharyngeal Swab     Status: None   Collection Time: 03/21/20  7:52 AM   Specimen: Nasopharyngeal Swab  Result Value Ref Range Status   SARS Coronavirus 2 NEGATIVE NEGATIVE Final    Comment:  (NOTE) SARS-CoV-2 target nucleic acids are NOT DETECTED. The SARS-CoV-2 RNA is generally detectable in upper and lower respiratory specimens during the acute phase of infection. Negative results do not preclude SARS-CoV-2 infection, do not rule out co-infections with other pathogens, and should not be used as the sole basis for treatment or other patient management decisions. Negative results must be combined with clinical observations, patient history, and epidemiological information. The expected result is Negative. Fact Sheet for Patients: SugarRoll.be Fact Sheet for Healthcare Providers: https://www.woods-mathews.com/ This test is not yet approved or cleared by the [place] FDA and  has been authorized for detection and/or diagnosis of SARS-CoV-2 by FDA under an Emergency Use Authorization (EUA). This EUA will remain  in effect (meaning this test can be used) for the duration of the COVID-19 declaration under Section 56 4(b)(1) of the Act, 21 U.S.C. section 360bbb-3(b)(1), unless the authorization is terminated or revoked sooner. Performed at [facility], [place] [address] [place], [place] [address]     Today  Subjective    Mauricio Coupland today has no headache,no chest abdominal pain,no new weakness tingling or numbness, feels much better     Objective   Blood pressure (!) 175/98, pulse 75, temperature 98.3 F (36.8 C), temperature source Oral, resp. rate 16, height 5\' 9"  (1.753 m), weight 66.8 kg, SpO2 99 %.   Intake/Output Summary (Last 24 hours) at 03/22/2020 1409 Last data filed at 03/22/2020 1107 Gross per 24 hour  Intake 100 ml  Output 1013 ml  Net -913 ml    Exam  Awake , pleasantly confused undergoing HD,  No new F.N deficits, Normal affect [place].AT,PERRAL Supple Neck,No JVD, No cervical lymphadenopathy appriciated.  Symmetrical Chest wall movement, Good air movement bilaterally, CTAB RRR,No Gallops,Rubs or  new Murmurs, No Parasternal Heave +ve B.Sounds, Abd Soft, Non tender, No organomegaly appriciated, No rebound -guarding or rigidity. No Cyanosis, Clubbing or edema, No new Rash or bruise   Data Review   CBC w Diff:  Lab Results  Component Value Date   WBC 15.5 (H) 03/22/2020   HGB 8.9 (L) 03/22/2020   HCT 27.4 (L) 03/22/2020   HCT 21.5 (L) 03/06/2020   PLT 318 03/22/2020   LYMPHOPCT 4 03/20/2020   MONOPCT 11 03/20/2020   EOSPCT 0 03/20/2020   BASOPCT 0 03/20/2020    CMP:  Lab Results  Component Value Date   NA 138 03/22/2020   K 4.8 03/22/2020   CL 97 (L) 03/22/2020   CO2 23 03/22/2020   BUN 102 (H) 03/22/2020   CREATININE 6.85 (H) 03/22/2020   PROT 5.9 (L) 03/18/2020   ALBUMIN 3.0 (L) 03/22/2020   BILITOT 0.8 03/18/2020   ALKPHOS 61 03/18/2020   AST 142 (H) 03/18/2020   ALT 119 (H) 03/18/2020  .   Total Time in preparing paper work, data evaluation and todays exam - 42 minutes  Lala Lund M.D on 03/22/2020 at 2:09 PM  [facility]   Office  [phone]

## 2020-03-22 NOTE — TOC Transition Note (Signed)
Transition of Care (TOC) - CM/SW Discharge Note *Discharged to [facility] Number for Report: [phone] *Room number 102P    Patient Details  Name: Gabriel Weller Leise Sr. MRN: [id number] Date of Birth: 09/21/1929  Transition of Care [facility] CM/SW Contact:  Sable Feil, LCSW Phone Number: 03/22/2020, 12:34 PM   Clinical Narrative: Patient medically stable for discharge and will be transported to [facility] [place] [facility]. Discharge clinicals transmitted to facility and communicated with admissions director Irine Seal. Son contacted by phone and VM full and was then contacted via HIPPA compliant text regarding discharge.       Final next level of care: Skilled Nursing Facility([facility]) Barriers to Discharge: Barriers Resolved   Patient Goals and CMS Choice Patient states their goals for this hospitalization and ongoing recovery are:: The goal is for patient to get stronger and return home CMS Medicare.gov Compare Post Acute Care list provided to:: Patient Represenative (must comment)(Talked with patient's son regarding [employer]) Choice offered to / list presented to : Adult Children  Discharge Placement PASRR number recieved: 03/13/20            Patient chooses bed at: [facility] Patient to be transferred to facility by: Non-emergency ambulance Name of family member notified: Damonie Ellenwood; [phone] Patient and family notified of of transfer: 03/22/20  Discharge Plan and Services In-house Referral: Clinical Social Work   Post Acute Care Choice: [place] [place]                              Social Determinants of Health (SDOH) Interventions  No SDOH interventions requested or needed at discharge.   Readmission Risk Interventions No flowsheet data found.

## 2020-03-22 NOTE — Progress Notes (Signed)
Given report to the nurse Vaughan Basta at [facility] and answered all her questions.

## 2020-03-22 NOTE — Progress Notes (Signed)
Gabriel Dom Adames Sr. to be discharged [facility] per MD order. Patient verbalized understanding.  Skin clean, dry and intact without evidence of skin break down, no evidence of skin tears noted. IV catheter discontinued intact. Site without signs and symptoms of complications. Dressing and pressure applied. Pt denies pain at the site currently. No complaints noted.  Patient free of lines, drains, and wounds.   Discharge packet assembled. An After Visit Summary (AVS) was printed and given to the EMS personnel. Patient escorted via stretcher and discharged to [facility] via ambulance. Report called to accepting facility; all questions and concerns addressed.   Amaryllis Dyke, RN

## 2020-03-22 NOTE — Progress Notes (Signed)
[facility] Progress Note   Subjective:  Seen on HD  Objective Vitals:   03/22/20 1030 03/22/20 1100 03/22/20 1102 03/22/20 1107  BP: (!) 145/95 121/84 (!) 163/82 (!) 175/98  Pulse: 77 64 75 75  Resp:    16  Temp:    98.3 F (36.8 C)  TempSrc:    Oral  SpO2:    99%  Weight:    66.8 kg  Height:       Physical Exam General: Frail appearing man, NAD, pleasantly confused Heart: RRR; no murmur Lungs: CTAB Abdomen: Non-tender Extremities: No LE edema. Bruising to L hip/thigh and L elbow Dialysis Access:  [facility]  Additional Objective Labs: Basic Metabolic Panel: Recent Labs  Lab 03/19/20 1125 03/20/20 1038 03/22/20 0711  NA 136 137 138  K 3.7 4.2 4.8  CL 98 99 97*  CO2 27 24 23   GLUCOSE 101* 113* 110*  BUN 9 43* 102*  CREATININE 1.24 3.77* 6.85*  CALCIUM 8.1* 9.0 8.5*  PHOS 1.5* 4.3 6.4*   Liver Function Tests: Recent Labs  Lab 03/16/20 0549 03/16/20 0549 03/17/20 1048 03/18/20 0140 03/18/20 1004 03/18/20 1004 03/19/20 1125 03/20/20 1038 03/22/20 0711  AST 106*  --  92*  --  142*  --   --   --   --   ALT 70*  --  68*  --  119*  --   --   --   --   ALKPHOS 85  --  77  --  61  --   --   --   --   BILITOT 0.8  --  0.6  --  0.8  --   --   --   --   PROT 6.9  --  6.6  --  5.9*  --   --   --   --   ALBUMIN 3.5   < > 3.5   < > 3.2*   < > 3.2* 3.1* 3.0*   < > = values in this interval not displayed.   CBC: Recent Labs  Lab 03/17/20 1048 03/17/20 1048 03/18/20 1004 03/18/20 1004 03/19/20 1122 03/19/20 1122 03/19/20 1407 03/20/20 0344 03/20/20 1038 03/21/20 0427 03/22/20 0405  WBC 19.4*   < > 17.2*   < > 16.4*   < > 16.6*  --  15.5* 19.7* 15.5*  NEUTROABS 16.8*  --  14.0*  --   --   --   --   --  12.8*  --   --   HGB 10.4*   < > 8.2*   < > 7.2*   < > 7.5*   < > 8.7* 8.9* 8.9*  HCT 33.6*   < > 26.1*   < > 22.3*   < > 23.4*   < > 27.5* 28.0* 27.4*  MCV 101.2*   < > 100.8*   < > 100.5*  --  100.9*  --  98.9 98.2 98.9  PLT 356   < > 286   <  > 278   < > 275  --  273 293 318   < > = values in this interval not displayed.   CBG: Recent Labs  Lab 03/22/20 0040 03/22/20 0502 03/22/20 0942 03/22/20 1158 03/22/20 1244  GLUCAP 144* 111* 105* 69* 104*   Medications: . chlorproMAZINE (THORAZINE) IV     . allopurinol  100 mg Oral Daily  . aspirin EC  81 mg Oral Daily  . calcitRIOL  1 mcg Oral Q M,W,F-HD  .  carvedilol  3.125 mg Oral BID WC  . Chlorhexidine Gluconate Cloth  6 each Topical Q0600  . cinacalcet  30 mg Oral Q M,W,F-HD  . citalopram  10 mg Oral Daily  . darbepoetin (ARANESP) injection - DIALYSIS  100 mcg Intravenous Q Fri-HD  . feeding supplement (NEPRO CARB STEADY)  237 mL Oral QHS  . feeding supplement (PRO-STAT SUGAR FREE 64)  30 mL Oral BID  . ferrous sulfate  325 mg Oral BID WC  . BioGaia Probiotic  5 drop Oral Q2000  . latanoprost  1 drop Both Eyes QHS  . liver oil-zinc oxide   Topical BID  . midodrine  10 mg Oral TID  . multivitamin  1 tablet Oral QHS  . pantoprazole  40 mg Oral BID  . predniSONE  50 mg Oral BID WC  . rosuvastatin  20 mg Oral QHS    Dialysis Orders: MWF at [facility] 4 hours, 400 BFR via TDC EDW 66 2K/2CaNo heparin - Calcitriol 1.5 mcg and sensipar 30 q HD, mircera 30  Assessment/Plan: 1.Confusion: Variable with baseline dementia. Hypoglycemic on arrival, + UTI -> treated with Ceftriaxone. Recent SDH, repeat CT head 5/5 redemonstrating R sided subdural with minimal decrease.  2.Hypoglycemia- per primary-->CT abd 5/10 with no obvious pancreatic lesion. ACTH normal, proinsulin pending, C-peptide(16.1) Insulin (21.8), sulfonylureasnegative/ND. ACTH stimulation test and TSH stable. On PO prednisone and to f/u as OP w/Endocrine.  3. Bilateral pleural effusions - s/p thoracentesis5/8. CT abd showed no recurrence.  4. ESRD- Continue HD per MWF schedule. HD today 5. Anemiaof CKD -Hgb dropped 5/14, no bleeding noted. Aranesp 173mcg given. Hgb stable. 6. Secondary  hyperparathyroidism - Ca/Phos ok. No binders. 7.HTN/volume- Blood pressuredrops during HD. On mido TID. Does not appear volume overloaded, but with ^ BNP + pleural effusions -> UF as tolerated.At edw post HD today.  8. Nutrition- Alb low - continue Nepro + pro-stat supplements. 9. Transaminitis: LFTs rising. Ammonia ok. Consider holding statin. 10. L elbow hematoma, L hip hematoma on CT  11. Dispo - for dc to SNF [facility], possibly today  Kelly Splinter, MD 03/22/2020, 1:29 PM

## 2020-03-22 NOTE — Plan of Care (Signed)
  Problem: Safety: Goal: Ability to remain free from injury will improve Outcome: Progressing   

## 2020-03-22 NOTE — Discharge Instructions (Signed)
Follow with Primary MD [name], MD in 7 days   Get CBC, CMP  checked next visit within 1 week by Primary MD or SNF MD    Activity: As tolerated with Full fall precautions use walker/cane & assistance as needed  Disposition SNF  Diet: Dysphagia 1 diet with feeding assistance and aspiration precautions.  Special Instructions: If you have smoked or chewed Tobacco  in the last 2 yrs please stop smoking, stop any regular Alcohol  and or any Recreational drug use.  On your next visit with your primary care physician please Get Medicines reviewed and adjusted.  Please request your Prim.MD to go over all Hospital Tests and Procedure/Radiological results at the follow up, please get all Hospital records sent to your Prim MD by signing hospital release before you go home.  If you experience worsening of your admission symptoms, develop shortness of breath, life threatening emergency, suicidal or homicidal thoughts you must seek medical attention immediately by calling 911 or calling your MD immediately  if symptoms less severe.  You Must read complete instructions/literature along with all the possible adverse reactions/side effects for all the Medicines you take and that have been prescribed to you. Take any new Medicines after you have completely understood and accpet all the possible adverse reactions/side effects.

## 2020-03-23 ENCOUNTER — Telehealth: Payer: Self-pay | Admitting: *Deleted

## 2020-03-23 DIAGNOSIS — D638 Anemia in other chronic diseases classified elsewhere: Secondary | ICD-10-CM | POA: Diagnosis not present

## 2020-03-23 DIAGNOSIS — N186 End stage renal disease: Secondary | ICD-10-CM | POA: Diagnosis not present

## 2020-03-23 DIAGNOSIS — I951 Orthostatic hypotension: Secondary | ICD-10-CM | POA: Diagnosis not present

## 2020-03-23 DIAGNOSIS — S065X9A Traumatic subdural hemorrhage with loss of consciousness of unspecified duration, initial encounter: Secondary | ICD-10-CM | POA: Diagnosis not present

## 2020-03-23 DIAGNOSIS — F39 Unspecified mood [affective] disorder: Secondary | ICD-10-CM | POA: Diagnosis not present

## 2020-03-23 DIAGNOSIS — R1319 Other dysphagia: Secondary | ICD-10-CM | POA: Diagnosis not present

## 2020-03-23 DIAGNOSIS — N390 Urinary tract infection, site not specified: Secondary | ICD-10-CM | POA: Diagnosis not present

## 2020-03-23 DIAGNOSIS — S8012XA Contusion of left lower leg, initial encounter: Secondary | ICD-10-CM | POA: Diagnosis not present

## 2020-03-23 DIAGNOSIS — E162 Hypoglycemia, unspecified: Secondary | ICD-10-CM | POA: Diagnosis not present

## 2020-03-23 DIAGNOSIS — R195 Other fecal abnormalities: Secondary | ICD-10-CM | POA: Diagnosis not present

## 2020-03-23 DIAGNOSIS — J690 Pneumonitis due to inhalation of food and vomit: Secondary | ICD-10-CM | POA: Diagnosis not present

## 2020-03-23 NOTE — Telephone Encounter (Signed)
Pt was on TCM report to ED with Altered Menta Status. Pt admitted 5/5/21further evaluation, work-up suggested persistent hypoglycemia.Suspect etiology of encephalopathy to be a combination of hypoglycemia, recent SDH/ongoing UTI/infection. CT head on admission showed decreased size of the SDH.  Encephalopathy is thought now to be due to persistent hypoglycemia. Pt D/C 03/22/20 to SNF.Marland Kitchen/l,mb

## 2020-03-24 DIAGNOSIS — N2581 Secondary hyperparathyroidism of renal origin: Secondary | ICD-10-CM | POA: Diagnosis not present

## 2020-03-24 DIAGNOSIS — D631 Anemia in chronic kidney disease: Secondary | ICD-10-CM | POA: Diagnosis not present

## 2020-03-24 DIAGNOSIS — N186 End stage renal disease: Secondary | ICD-10-CM | POA: Diagnosis not present

## 2020-03-24 DIAGNOSIS — Z992 Dependence on renal dialysis: Secondary | ICD-10-CM | POA: Diagnosis not present

## 2020-03-26 ENCOUNTER — Other Ambulatory Visit: Payer: Self-pay

## 2020-03-26 DIAGNOSIS — N2581 Secondary hyperparathyroidism of renal origin: Secondary | ICD-10-CM | POA: Diagnosis not present

## 2020-03-26 DIAGNOSIS — D631 Anemia in chronic kidney disease: Secondary | ICD-10-CM | POA: Diagnosis not present

## 2020-03-26 DIAGNOSIS — L02414 Cutaneous abscess of left upper limb: Secondary | ICD-10-CM | POA: Diagnosis not present

## 2020-03-26 DIAGNOSIS — E162 Hypoglycemia, unspecified: Secondary | ICD-10-CM | POA: Diagnosis not present

## 2020-03-26 DIAGNOSIS — N186 End stage renal disease: Secondary | ICD-10-CM | POA: Diagnosis not present

## 2020-03-26 DIAGNOSIS — Z992 Dependence on renal dialysis: Secondary | ICD-10-CM | POA: Diagnosis not present

## 2020-03-27 LAB — PROINSULIN/INSULIN RATIO
Insulin: 8.9 u[IU]/mL
Proinsulin/Insulin Ratio: 143 %
Proinsulin: 85 pmol/L

## 2020-03-29 DIAGNOSIS — Z992 Dependence on renal dialysis: Secondary | ICD-10-CM | POA: Diagnosis not present

## 2020-03-29 DIAGNOSIS — N2581 Secondary hyperparathyroidism of renal origin: Secondary | ICD-10-CM | POA: Diagnosis not present

## 2020-03-29 DIAGNOSIS — N186 End stage renal disease: Secondary | ICD-10-CM | POA: Diagnosis not present

## 2020-03-29 DIAGNOSIS — D631 Anemia in chronic kidney disease: Secondary | ICD-10-CM | POA: Diagnosis not present

## 2020-03-30 ENCOUNTER — Other Ambulatory Visit: Payer: Self-pay

## 2020-03-30 ENCOUNTER — Telehealth: Payer: Self-pay

## 2020-03-30 ENCOUNTER — Ambulatory Visit: Payer: Medicare Other | Admitting: Endocrinology

## 2020-03-30 DIAGNOSIS — E161 Other hypoglycemia: Secondary | ICD-10-CM | POA: Diagnosis not present

## 2020-03-30 DIAGNOSIS — K5901 Slow transit constipation: Secondary | ICD-10-CM | POA: Diagnosis not present

## 2020-03-30 DIAGNOSIS — Z992 Dependence on renal dialysis: Secondary | ICD-10-CM | POA: Diagnosis not present

## 2020-03-30 DIAGNOSIS — D6489 Other specified anemias: Secondary | ICD-10-CM | POA: Diagnosis not present

## 2020-03-30 DIAGNOSIS — R600 Localized edema: Secondary | ICD-10-CM | POA: Diagnosis not present

## 2020-03-30 DIAGNOSIS — R5381 Other malaise: Secondary | ICD-10-CM | POA: Diagnosis not present

## 2020-03-30 DIAGNOSIS — E8770 Fluid overload, unspecified: Secondary | ICD-10-CM | POA: Diagnosis not present

## 2020-03-30 DIAGNOSIS — F0280 Dementia in other diseases classified elsewhere without behavioral disturbance: Secondary | ICD-10-CM | POA: Diagnosis not present

## 2020-03-30 DIAGNOSIS — R262 Difficulty in walking, not elsewhere classified: Secondary | ICD-10-CM | POA: Diagnosis not present

## 2020-03-30 NOTE — Telephone Encounter (Signed)
[name], the scheduler for [facility] called back and she stated that she did not know that the pt could not be dropped off early. She was informed that the patient can be dropped off early, but he was dropped off unassisted and he has altered mental status. She reports that she did not know this, and she "only schedules the appointments". [name] was then informed that pt would need to have someone accompany him. She stated that that was not possible, and for this reason, pt's appt was canceled and son was contacted to reschedule. Pt is currently waiting on transportation to pick him up to return him to the facility. [name] was then notified, and he did voice concerns about how his loved one was treated. [name] was upset because he is aware that the pt has an altered mental status that impairs his communication abilities as well as his functional abilities to walk appropriately. [name] wanted to schedule an appt as soon as possible to get pt's issues addressed. Dr. Loanne Drilling, who he was originally scheduled with, does not have an availability until July. [name] verbally agreed to see any provider in this office that has an availability, and he will accompany the pt to this visit.  Dr. Dwyane Dee did agree to see pt on 04/06/20 at 2pm. This appt was made, and son is aware and will be present.   [name] was then called back and voicemail had to be left for her. She returned the call at 12:27pm, and she was informed of all this information. She verbalized understanding of this. She was also informed that we must have a list of pt's active medications and blood sugars on the day of his appt. She stated that she would ensure that this is taken care of. She was also informed that it is okay for the pt to arrive for his appt early, but several hours early, unassisted, while experiencing altered mental status and decreased use in functional abilities is not okay. She verbalized understanding of this.   At 12:35pm,  transportation came to pick pt up and take him back to [facility].

## 2020-03-30 NOTE — Telephone Encounter (Signed)
Pt was brought into office for 1pm appt today and dropped off at 11:45. Pt has altered mental status and has recently been seen in the ED for this issue. Pt was not accompanied to this appt.  Called pt's rehab facility, [facility]. Informed them of what has happened. Attempted to speak with RN supervisor and did not get an answer. Then spoke with a floor nurse and she agreed that patient is not safe to be left alone due to his condition. She took my direct line [phone]), and she stated that she would call back in a few minutes, once she spoke with someone that could assist in this mater.  MD as well as his nurse, and practice administrator are aware of this.

## 2020-03-31 DIAGNOSIS — D631 Anemia in chronic kidney disease: Secondary | ICD-10-CM | POA: Diagnosis not present

## 2020-03-31 DIAGNOSIS — Z992 Dependence on renal dialysis: Secondary | ICD-10-CM | POA: Diagnosis not present

## 2020-03-31 DIAGNOSIS — N2581 Secondary hyperparathyroidism of renal origin: Secondary | ICD-10-CM | POA: Diagnosis not present

## 2020-03-31 DIAGNOSIS — N186 End stage renal disease: Secondary | ICD-10-CM | POA: Diagnosis not present

## 2020-04-01 DIAGNOSIS — D72829 Elevated white blood cell count, unspecified: Secondary | ICD-10-CM | POA: Diagnosis not present

## 2020-04-01 DIAGNOSIS — D649 Anemia, unspecified: Secondary | ICD-10-CM | POA: Diagnosis not present

## 2020-04-01 DIAGNOSIS — R600 Localized edema: Secondary | ICD-10-CM | POA: Diagnosis not present

## 2020-04-02 ENCOUNTER — Other Ambulatory Visit: Payer: Self-pay | Admitting: *Deleted

## 2020-04-02 DIAGNOSIS — D631 Anemia in chronic kidney disease: Secondary | ICD-10-CM | POA: Diagnosis not present

## 2020-04-02 DIAGNOSIS — Z992 Dependence on renal dialysis: Secondary | ICD-10-CM | POA: Diagnosis not present

## 2020-04-02 DIAGNOSIS — N2581 Secondary hyperparathyroidism of renal origin: Secondary | ICD-10-CM | POA: Diagnosis not present

## 2020-04-02 DIAGNOSIS — N186 End stage renal disease: Secondary | ICD-10-CM | POA: Diagnosis not present

## 2020-04-02 NOTE — Patient Outreach (Signed)
Late entry for 04/01/20  Screened for potential [facility] Care Management needs as a benefit of  [employer].  [name] is receiving skilled therapy at [facility].  Writer attended telephonic interdisciplinary team meeting to assess for disposition needs and transition plan for resident.   Facility reports member is from home with wife and son. Has good support system. He has confusion. Goes to HD. Anticipated transition plan is to return home with family.  Will continue to follow for potential [facility] needs while [name] remains in SNF. Will plan outreach to family as appropriate.   Marthenia Rolling, MSN-Ed, RN,BSN [place] Acute Care Coordinator [phone] [facility]) [phone]  (Toll free office)

## 2020-04-05 DIAGNOSIS — D631 Anemia in chronic kidney disease: Secondary | ICD-10-CM | POA: Diagnosis not present

## 2020-04-05 DIAGNOSIS — N2581 Secondary hyperparathyroidism of renal origin: Secondary | ICD-10-CM | POA: Diagnosis not present

## 2020-04-05 DIAGNOSIS — N186 End stage renal disease: Secondary | ICD-10-CM | POA: Diagnosis not present

## 2020-04-05 DIAGNOSIS — Z992 Dependence on renal dialysis: Secondary | ICD-10-CM | POA: Diagnosis not present

## 2020-04-06 ENCOUNTER — Ambulatory Visit: Payer: Medicare Other | Admitting: Endocrinology

## 2020-04-06 DIAGNOSIS — R2232 Localized swelling, mass and lump, left upper limb: Secondary | ICD-10-CM | POA: Diagnosis not present

## 2020-04-06 DIAGNOSIS — D6489 Other specified anemias: Secondary | ICD-10-CM | POA: Diagnosis not present

## 2020-04-06 DIAGNOSIS — R799 Abnormal finding of blood chemistry, unspecified: Secondary | ICD-10-CM | POA: Diagnosis not present

## 2020-04-06 DIAGNOSIS — E878 Other disorders of electrolyte and fluid balance, not elsewhere classified: Secondary | ICD-10-CM | POA: Diagnosis not present

## 2020-04-13 ENCOUNTER — Encounter: Payer: Self-pay | Admitting: Vascular Surgery

## 2020-04-13 ENCOUNTER — Ambulatory Visit (HOSPITAL_COMMUNITY)
Admission: RE | Admit: 2020-04-13 | Discharge: 2020-04-13 | Disposition: A | Discharge status: Home / Self Care | Payer: No Typology Code available for payment source | Source: Ambulatory Visit | Attending: Vascular Surgery | Admitting: Vascular Surgery

## 2020-04-13 ENCOUNTER — Ambulatory Visit (INDEPENDENT_AMBULATORY_CARE_PROVIDER_SITE_OTHER)
Admission: RE | Admit: 2020-04-13 | Discharge: 2020-04-13 | Disposition: A | Discharge status: Home / Self Care | Payer: No Typology Code available for payment source | Source: Ambulatory Visit | Attending: Vascular Surgery | Admitting: Vascular Surgery

## 2020-04-13 ENCOUNTER — Other Ambulatory Visit: Payer: Self-pay

## 2020-04-13 ENCOUNTER — Ambulatory Visit (INDEPENDENT_AMBULATORY_CARE_PROVIDER_SITE_OTHER): Payer: Medicare Other | Admitting: Vascular Surgery

## 2020-04-13 VITALS — BP 183/87 | HR 80 | Temp 97.5°F | Resp 18 | Ht 68.0 in | Wt 140.0 lb

## 2020-04-13 DIAGNOSIS — N186 End stage renal disease: Secondary | ICD-10-CM | POA: Insufficient documentation

## 2020-04-13 DIAGNOSIS — F39 Unspecified mood [affective] disorder: Secondary | ICD-10-CM | POA: Diagnosis not present

## 2020-04-13 DIAGNOSIS — D72829 Elevated white blood cell count, unspecified: Secondary | ICD-10-CM | POA: Diagnosis not present

## 2020-04-13 DIAGNOSIS — Z992 Dependence on renal dialysis: Secondary | ICD-10-CM | POA: Diagnosis not present

## 2020-04-13 DIAGNOSIS — D638 Anemia in other chronic diseases classified elsewhere: Secondary | ICD-10-CM | POA: Diagnosis not present

## 2020-04-13 DIAGNOSIS — K5901 Slow transit constipation: Secondary | ICD-10-CM | POA: Diagnosis not present

## 2020-04-13 DIAGNOSIS — E161 Other hypoglycemia: Secondary | ICD-10-CM | POA: Diagnosis not present

## 2020-04-13 DIAGNOSIS — R0989 Other specified symptoms and signs involving the circulatory and respiratory systems: Secondary | ICD-10-CM | POA: Diagnosis not present

## 2020-04-13 DIAGNOSIS — F0280 Dementia in other diseases classified elsewhere without behavioral disturbance: Secondary | ICD-10-CM | POA: Diagnosis not present

## 2020-04-13 NOTE — Progress Notes (Signed)
Patient name: Gabriel Valletta Mcpeters Sr. MRN: [id number] DOB: 1929-03-26 Sex: male  REASON FOR CONSULT: Evaluate for permanent dialysis access  HPI: Gabriel Lampe Allaire Sr. is a 86 y.o. male, with end-stage renal disease who presents for evaluation of permanent dialysis access.  Patient currently on dialysis and uses a right IJ tunneled catheter and is coming from a nursing facility.  He previously had a left forearm loop graft in 2014 with Dr. Donnetta Hutching.  States he is right-handed.  He recently had a fall and has a large hematoma on his left arm.  Past Medical History:  Diagnosis Date  . ANEMIA-IRON DEFICIENCY 03/09/2008  . Arthritis    cervical spine.   . Complex renal cyst 06/19/2011  . Depression 03/26/2014  . DIVERTICULOSIS, COLON 03/09/2008  . ESRD (end stage renal disease) ([place]) 03/09/2008  . Flu 12/08/2016  . GOUT 03/09/2008  . Hemorrhoids 08/2009   internal.   . History of blood transfusion   . HYPERLIPIDEMIA 03/09/2008  . Hyperparathyroidism ([place])   . HYPERTENSION 03/09/2008  . Prostate cancer [facility]) 2002   Completed external beam radiation 2003.per HPI  . PROSTATE CANCER, HX OF 03/09/2008  . Radiation cystitis 2010.  Marland Kitchen Stroke [facility])     Past Surgical History:  Procedure Laterality Date  . AV FISTULA PLACEMENT Left 01/24/2013   Procedure: INSERTION OF ARTERIOVENOUS (AV) GORE-TEX GRAFT ARM;  Surgeon: Rosetta Posner, MD;  Location: [place];  Service: Vascular;  Laterality: Left;  . COLONOSCOPY N/A 06/17/2015   Procedure: COLONOSCOPY;  Surgeon: Gatha Mayer, MD;  Location: [place];  Service: Endoscopy;  Laterality: N/A;  . ESOPHAGOGASTRODUODENOSCOPY N/A 06/17/2015   Procedure: ESOPHAGOGASTRODUODENOSCOPY (EGD);  Surgeon: Gatha Mayer, MD;  Location: [facility] ENDOSCOPY;  Service: Endoscopy;  Laterality: N/A;  . FLEXIBLE SIGMOIDOSCOPY N/A 06/15/2015   Procedure: FLEXIBLE SIGMOIDOSCOPY;  Surgeon: Gatha Mayer, MD;  Location: [place];  Service: Endoscopy;  Laterality: N/A;  . GIVENS CAPSULE STUDY N/A  07/06/2015   Procedure: GIVENS CAPSULE STUDY;  Surgeon: Milus Banister, MD;  Location: [place];  Service: Endoscopy;  Laterality: N/A;  . JOINT REPLACEMENT     HIP  . rotator cuff repair right  4/08  . s/p left hip replacement  2007   Dr. Percell Miller ortho  . TONSILLECTOMY      Family History  Problem Relation Age of Onset  . Hypertension Father   . Diabetes Father   . Cancer Father        prostate cancer  . Hypertension Brother   . Cancer Brother     SOCIAL HISTORY: Social History   Socioeconomic History  . Marital status: Married    Spouse name: Not on file  . Number of children: 2  . Years of education: Not on file  . Highest education level: Not on file  Occupational History  . Occupation: retired Print production planner  Tobacco Use  . Smoking status: Former Smoker    Years: 5.00    Types: Cigarettes    Quit date: 09/17/1982    Years since quitting: 37.5  . Smokeless tobacco: Never Used  Substance and Sexual Activity  . Alcohol use: No    Alcohol/week: 0.0 standard drinks    Comment: rare  . Drug use: No  . Sexual activity: Not on file  Other Topics Concern  . Not on file  Social History Narrative   Lives at home with his wife   Right handed   Social Determinants of Health  Financial Resource Strain:   . Difficulty of Paying Living Expenses:   Food Insecurity:   . Worried About Charity fundraiser in the Last Year:   . Arboriculturist in the Last Year:   Transportation Needs:   . Film/video editor (Medical):   Marland Kitchen Lack of Transportation (Non-Medical):   Physical Activity:   . Days of Exercise per Week:   . Minutes of Exercise per Session:   Stress:   . Feeling of Stress :   Social Connections:   . Frequency of Communication with Friends and Family:   . Frequency of Social Gatherings with Friends and Family:   . Attends Religious Services:   . Active Member of Clubs or Organizations:   . Attends Archivist Meetings:   Marland Kitchen Marital  Status:   Intimate Partner Violence:   . Fear of Current or Ex-Partner:   . Emotionally Abused:   Marland Kitchen Physically Abused:   . Sexually Abused:     No Known Allergies  Current Outpatient Medications  Medication Sig Dispense Refill  . allopurinol (ZYLOPRIM) 100 MG tablet TAKE 1 TABLET BY MOUTH EVERY DAY (Patient taking differently: Take 100 mg by mouth daily. ) 90 tablet 3  . aspirin EC 81 MG tablet Take 1 tablet (81 mg total) by mouth daily.    Lorin Picket 1 GM 210 MG(Fe) tablet Take 420 mg by mouth 3 (three) times daily with meals.   10  . carvedilol (COREG) 3.125 MG tablet Take 1 tablet (3.125 mg total) by mouth 2 (two) times daily with a meal.    . citalopram (CELEXA) 10 MG tablet Take 1 tablet (10 mg total) by mouth daily. 90 tablet 1  . Darbepoetin Alfa (ARANESP) 60 MCG/0.3ML SOSY injection Inject 0.3 mLs (60 mcg total) into the vein every Monday with hemodialysis. 4.2 mL   . docusate sodium (COLACE) 100 MG capsule Take 2 capsules (200 mg total) by mouth 2 (two) times daily. 30 capsule 0  . ferrous sulfate 325 (65 FE) MG tablet Take 1 tablet (325 mg total) by mouth 2 (two) times daily with a meal.  3  . latanoprost (XALATAN) 0.005 % ophthalmic solution Place 1 drop into both eyes at bedtime.   5  . lidocaine-prilocaine (EMLA) cream Apply 1 application topically See admin instructions. Apply to access site 1 hour before dialysis. Cover with saran wrap  11  . midodrine (PROAMATINE) 10 MG tablet Take 1 tablet (10 mg total) by mouth 3 (three) times daily. Take in the morning, lunch time and 4pm. Do not take after 4pm. 270 tablet 3  . Multiple Vitamin (MULTIVITAMIN WITH MINERALS) TABS tablet Take 1 tablet by mouth daily.    . multivitamin (RENA-VIT) TABS tablet Take 1 tablet by mouth at bedtime.  0  . nystatin (MYCOSTATIN/NYSTOP) powder Use as directed twice per day (Patient taking differently: Apply 1 g topically 2 (two) times daily as needed (rash). ) 45 g 1  . pantoprazole (PROTONIX) 40 MG  tablet Take 1 tablet (40 mg total) by mouth 2 (two) times daily.    . predniSONE (DELTASONE) 50 MG tablet Take 1 tablet (50 mg total) by mouth 2 (two) times daily with a meal.    . rosuvastatin (CRESTOR) 20 MG tablet Take 1 tablet (20 mg total) by mouth at bedtime. 90 tablet 3  . Vitamin D, Ergocalciferol, (DRISDOL) 1.25 MG (50000 UNIT) CAPS capsule Take 1 capsule (50,000 Units total) by mouth every 7 (seven) days.  12 capsule 0   No current facility-administered medications for this visit.    REVIEW OF SYSTEMS:  [X]  denotes positive finding, [ ]  denotes negative finding Cardiac  Comments:  Chest pain or chest pressure:    Shortness of breath upon exertion:    Short of breath when lying flat:    Irregular heart rhythm:        Vascular    Pain in calf, thigh, or hip brought on by ambulation:    Pain in feet at night that wakes you up from your sleep:     Blood clot in your veins:    Leg swelling:         Pulmonary    Oxygen at home:    Productive cough:     Wheezing:         Neurologic    Sudden weakness in arms or legs:     Sudden numbness in arms or legs:     Sudden onset of difficulty speaking or slurred speech:    Temporary loss of vision in one eye:     Problems with dizziness:         Gastrointestinal    Blood in stool:     Vomited blood:         Genitourinary    Burning when urinating:     Blood in urine:        Psychiatric    Major depression:         Hematologic    Bleeding problems:    Problems with blood clotting too easily:        Skin    Rashes or ulcers:        Constitutional    Fever or chills:      PHYSICAL EXAM: Vitals:   04/13/20 1234  BP: (!) 183/87  Pulse: 80  Resp: 18  Temp: (!) 97.5 F (36.4 C)  TempSrc: Temporal  Weight: 140 lb (63.5 kg)  Height: 5\' 8"  (1.727 m)    GENERAL: The patient is a well-nourished male, in no acute distress. The vital signs are documented above. CARDIAC: There is a regular rate and rhythm.  VASCULAR:    Palpable brachial and radial pulses bilateral upper extremities Left forearm loop graft no thrill Large hematoma to left forearm up to antecubital region PULMONARY: There is good air exchange bilaterally without wheezing or rales. ABDOMEN: Soft and non-tender with normal pitched bowel sounds.  MUSCULOSKELETAL: There are no major deformities or cyanosis. NEUROLOGIC: No focal weakness or paresthesias are detected. SKIN: There are no ulcers or rashes noted. PSYCHIATRIC: The patient has a normal affect.  DATA:   Vein mapping shows no surface veins in the bilateral upper extremities with good arterial waveforms that are triphasic in both upper extremities.  Assessment/Plan:  86 year old male presents for evaluation of permanent dialysis access in the setting of end-stage renal disease and previously failed left forearm loop graft.  Discussed that based on vein mapping he has no surface veins and would likely require AV graft placement.  Given that he is right-handed and in a wheelchair would plan for placement in the nondominant left arm.  He has a large hematoma on the forearm into the antecubitum from a recent fall and discussed would likely delay surgery for least 4 to 6 weeks while the hematoma resolves and he can use the catheter in the interim.  We discussed risk benefits including bleeding, infection, steal, failure mature etc.  We will get him scheduled.  Marty Heck, MD [facility] Office: [phone]

## 2020-04-20 ENCOUNTER — Other Ambulatory Visit: Payer: Self-pay | Admitting: *Deleted

## 2020-04-20 NOTE — Patient Outreach (Signed)
[facility] Post- Acute Care Coordinator follow up  Mr. Casseus is receiving skilled therapy at [facility].   Telephone call made to spouse/DPR [facility] [phone].   Mrs. Noon states the plan is for member to return home at [facility] discharge. Mrs. Nedeau indicates she will be primary caregiver. States she plans to visit member at [employer] today. Mrs. Lueth states she is not sure when member is discharging. She has not had care plan meeting with facility yet.   Explained [place] Management services. Mrs. Avis asks that writer contact her later when she has more details about discharge and needs.   Will plan to outreach Mrs. Santacroce at later time.   Will continue to follow for transition plans and potential [facility] Care Management needs while member resides in SNF.    Marthenia Rolling, MSN-Ed, RN,BSN [place] Acute Care Coordinator [phone] [facility]) [phone]  (Toll free office)

## 2020-04-22 ENCOUNTER — Emergency Department (HOSPITAL_COMMUNITY)
Admission: EM | Admit: 2020-04-22 | Discharge: 2020-04-22 | Disposition: A | Discharge status: Home / Self Care | Payer: Medicare Other | Attending: Emergency Medicine | Admitting: Emergency Medicine

## 2020-04-22 ENCOUNTER — Emergency Department (HOSPITAL_COMMUNITY): Payer: Medicare Other

## 2020-04-22 DIAGNOSIS — Z992 Dependence on renal dialysis: Secondary | ICD-10-CM | POA: Diagnosis not present

## 2020-04-22 DIAGNOSIS — Z8546 Personal history of malignant neoplasm of prostate: Secondary | ICD-10-CM | POA: Insufficient documentation

## 2020-04-22 DIAGNOSIS — R002 Palpitations: Secondary | ICD-10-CM | POA: Insufficient documentation

## 2020-04-22 DIAGNOSIS — Z7982 Long term (current) use of aspirin: Secondary | ICD-10-CM | POA: Diagnosis not present

## 2020-04-22 DIAGNOSIS — R059 Cough, unspecified: Secondary | ICD-10-CM

## 2020-04-22 DIAGNOSIS — N186 End stage renal disease: Secondary | ICD-10-CM | POA: Insufficient documentation

## 2020-04-22 DIAGNOSIS — I120 Hypertensive chronic kidney disease with stage 5 chronic kidney disease or end stage renal disease: Secondary | ICD-10-CM | POA: Insufficient documentation

## 2020-04-22 DIAGNOSIS — Z79899 Other long term (current) drug therapy: Secondary | ICD-10-CM | POA: Insufficient documentation

## 2020-04-22 DIAGNOSIS — R05 Cough: Secondary | ICD-10-CM | POA: Insufficient documentation

## 2020-04-22 DIAGNOSIS — Z87891 Personal history of nicotine dependence: Secondary | ICD-10-CM | POA: Diagnosis not present

## 2020-04-22 DIAGNOSIS — F0390 Unspecified dementia without behavioral disturbance: Secondary | ICD-10-CM | POA: Insufficient documentation

## 2020-04-22 LAB — BASIC METABOLIC PANEL
Anion gap: 14 (ref 5–15)
BUN: 23 mg/dL (ref 8–23)
CO2: 29 mmol/L (ref 22–32)
Calcium: 8.4 mg/dL — ABNORMAL LOW (ref 8.9–10.3)
Chloride: 95 mmol/L — ABNORMAL LOW (ref 98–111)
Creatinine, Ser: 3.95 mg/dL — ABNORMAL HIGH (ref 0.61–1.24)
GFR calc Af Amer: 15 mL/min — ABNORMAL LOW (ref >60)
GFR calc non Af Amer: 13 mL/min — ABNORMAL LOW (ref >60)
Glucose, Bld: 97 mg/dL (ref 70–99)
Potassium: 3.9 mmol/L (ref 3.5–5.1)
Sodium: 138 mmol/L (ref 135–145)

## 2020-04-22 LAB — CBC WITH DIFFERENTIAL/PLATELET
Abs Immature Granulocytes: 0.12 10*3/uL — ABNORMAL HIGH (ref 0.00–0.07)
Basophils Absolute: 0 10*3/uL (ref 0.0–0.1)
Basophils Relative: 0 %
Eosinophils Absolute: 0 10*3/uL (ref 0.0–0.5)
Eosinophils Relative: 0 %
HCT: 31.2 % — ABNORMAL LOW (ref 39.0–52.0)
Hemoglobin: 9.8 g/dL — ABNORMAL LOW (ref 13.0–17.0)
Immature Granulocytes: 1 %
Lymphocytes Relative: 2 %
Lymphs Abs: 0.2 10*3/uL — ABNORMAL LOW (ref 0.7–4.0)
MCH: 33.1 pg (ref 26.0–34.0)
MCHC: 31.4 g/dL (ref 30.0–36.0)
MCV: 105.4 fL — ABNORMAL HIGH (ref 80.0–100.0)
Monocytes Absolute: 0.7 10*3/uL (ref 0.1–1.0)
Monocytes Relative: 6 %
Neutro Abs: 11 10*3/uL — ABNORMAL HIGH (ref 1.7–7.7)
Neutrophils Relative %: 91 %
Platelets: 144 10*3/uL — ABNORMAL LOW (ref 150–400)
RBC: 2.96 MIL/uL — ABNORMAL LOW (ref 4.22–5.81)
RDW: 21.7 % — ABNORMAL HIGH (ref 11.5–15.5)
WBC: 12.1 10*3/uL — ABNORMAL HIGH (ref 4.0–10.5)
nRBC: 0 % (ref 0.0–0.2)

## 2020-04-22 MED ORDER — BENZONATATE 100 MG PO CAPS: 100.0000 mg | ORAL_CAPSULE | Freq: Three times a day (TID) | ORAL | 0 refills | Status: AC | PRN

## 2020-04-22 MED ORDER — MUCINEX 600 MG PO TB12: 600.0000 mg | ORAL_TABLET | Freq: Two times a day (BID) | ORAL | 0 refills | Status: AC | PRN

## 2020-04-22 NOTE — ED Triage Notes (Signed)
Pt to ED via EMS from [facility] c/o irregular heart rate per MD making rounds today - Per facility orientation at baseline. Pt receives dialysis- last treatment yesterday. Currently being treated for pneumonia and receiving rocephin. #20 lac - 400 ml fluid bolus. Last VS: `175/96, pulse 87, 97% on 3L- pt does not require home o2, EKG ST with PVC'S by EMS,

## 2020-04-22 NOTE — ED Notes (Signed)
Called [facility] for transportation  

## 2020-04-22 NOTE — ED Provider Notes (Signed)
[facility] EMERGENCY DEPARTMENT Provider Note   CSN: [id number] Arrival date & time: 04/22/20  1039     History Chief Complaint  Patient presents with  . Irregular Heart Beat    Gabriel H Hellmann Sr. is a 86 y.o. male.  Presents to ER with concern for irregular heartbeat.  ESRD on HD(M/W/F) anemia of chronic disease, HLD, orthostatic hypotension, depression  History somewhat limited due to mental status. Pt answers basic questions, he is unsure why he was sent in. Denies any acute medical complaints.   Complex hospitalization in May for encephalopathy, complicated UTI, aspiration pneumonia, bilateral pleural effusions.  Also with recent admission for subdural hematoma.  Patient discharged to SNF after hospitalization.  At SNF today, had reported that provider noted feeling irregular heartbeat and wanted patient to be seen in ER for further evaluation.  Additional history obtained via son.  He is with patient this morning, raises additional concern that patient has had increased cough lately.  HPI     Past Medical History:  Diagnosis Date  . ANEMIA-IRON DEFICIENCY 03/09/2008  . Arthritis    cervical spine.   . Complex renal cyst 06/19/2011  . Depression 03/26/2014  . DIVERTICULOSIS, COLON 03/09/2008  . ESRD (end stage renal disease) ([place]) 03/09/2008  . Flu 12/08/2016  . GOUT 03/09/2008  . Hemorrhoids 08/2009   internal.   . History of blood transfusion   . HYPERLIPIDEMIA 03/09/2008  . Hyperparathyroidism ([place])   . HYPERTENSION 03/09/2008  . Prostate cancer [facility]) 2002   Completed external beam radiation 2003.per HPI  . PROSTATE CANCER, HX OF 03/09/2008  . Radiation cystitis 2010.  Marland Kitchen Stroke [facility])     Patient Active Problem List   Diagnosis Date Noted  . Malnutrition of moderate degree 03/17/2020  . Pleural effusion   . Hypoglycemia 03/10/2020  . Acute lower UTI 03/10/2020  . Falls 03/06/2020  . General weakness 03/06/2020  . Subdural hematoma ([place]) 03/06/2020    . Vitamin D deficiency 01/10/2020  . Benign neoplasm of scrotum 01/08/2020  . Bilateral hearing loss due to cerumen impaction 03/17/2019  . Irregular heart rhythm 10/10/2018  . Left flank pain 01/10/2018  . Influenza A 12/08/2016  . Elevated troponin 12/08/2016  . Anemia of chronic disease 10/04/2016  . Orthostatic hypotension 09/07/2016  . Mass of skin of finger of left hand 10/11/2015  . ESRD on dialysis ([place]) 09/09/2015  . Cerebrovascular accident (CVA) due to thrombosis of right middle cerebral artery ([place]) 09/09/2015  . History of GI bleed   . HLD (hyperlipidemia)   . Benign neoplasm of descending colon   . Gastritis   . Syncope 06/15/2015  . Diverticulosis of colon with hemorrhage   . Low back pain 03/26/2014  . Depression 03/26/2014  . Complex renal cyst 06/19/2011  . Preventative health care 06/17/2011  . SHOULDER PAIN, LEFT 06/03/2010  . Gout 03/09/2008  . Iron deficiency anemia 03/09/2008  . Essential hypertension 03/09/2008  . FATIGUE 03/09/2008  . PROSTATE CANCER, HX OF 03/09/2008    Past Surgical History:  Procedure Laterality Date  . AV FISTULA PLACEMENT Left 01/24/2013   Procedure: INSERTION OF ARTERIOVENOUS (AV) GORE-TEX GRAFT ARM;  Surgeon: Rosetta Posner, MD;  Location: [place];  Service: Vascular;  Laterality: Left;  . COLONOSCOPY N/A 06/17/2015   Procedure: COLONOSCOPY;  Surgeon: Gatha Mayer, MD;  Location: [place];  Service: Endoscopy;  Laterality: N/A;  . ESOPHAGOGASTRODUODENOSCOPY N/A 06/17/2015   Procedure: ESOPHAGOGASTRODUODENOSCOPY (EGD);  Surgeon: Gatha Mayer,  MD;  Location: [place] [facility];  Service: Endoscopy;  Laterality: N/A;  . FLEXIBLE SIGMOIDOSCOPY N/A 06/15/2015   Procedure: FLEXIBLE SIGMOIDOSCOPY;  Surgeon: Gatha Mayer, MD;  Location: [place];  Service: Endoscopy;  Laterality: N/A;  . GIVENS CAPSULE STUDY N/A 07/06/2015   Procedure: GIVENS CAPSULE STUDY;  Surgeon: Milus Banister, MD;  Location: [place];  Service: Endoscopy;   Laterality: N/A;  . JOINT REPLACEMENT     HIP  . rotator cuff repair right  4/08  . s/p left hip replacement  2007   Dr. Percell Miller ortho  . TONSILLECTOMY         Family History  Problem Relation Age of Onset  . Hypertension Father   . Diabetes Father   . Cancer Father        prostate cancer  . Hypertension Brother   . Cancer Brother     Social History   Tobacco Use  . Smoking status: Former Smoker    Years: 5.00    Types: Cigarettes    Quit date: 09/17/1982    Years since quitting: 37.6  . Smokeless tobacco: Never Used  Vaping Use  . Vaping Use: Never used  Substance Use Topics  . Alcohol use: No    Alcohol/week: 0.0 standard drinks    Comment: rare  . Drug use: No    Home Medications Prior to Admission medications   Medication Sig Start Date End Date Taking? Authorizing Provider  allopurinol (ZYLOPRIM) 100 MG tablet TAKE 1 TABLET BY MOUTH EVERY DAY Patient taking differently: Take 100 mg by mouth daily.  03/03/20   [name], MD  aspirin EC 81 MG tablet Take 1 tablet (81 mg total) by mouth daily. 12/12/16   Rosalin Hawking, MD  AURYXIA 1 GM 210 MG(Fe) tablet Take 420 mg by mouth 3 (three) times daily with meals.  04/30/18   [provider]  benzonatate (TESSALON) 100 MG capsule Take 1 capsule (100 mg total) by mouth 3 (three) times daily as needed for cough. 04/22/20   Lucrezia Starch, MD  carvedilol (COREG) 3.125 MG tablet Take 1 tablet (3.125 mg total) by mouth 2 (two) times daily with a meal. 03/22/20   Thurnell Lose, MD  citalopram (CELEXA) 10 MG tablet Take 1 tablet (10 mg total) by mouth daily. 01/12/20   [name], MD  Darbepoetin Alfa (ARANESP) 60 MCG/0.3ML SOSY injection Inject 0.3 mLs (60 mcg total) into the vein every Monday with hemodialysis. 03/15/20   Amin, Jeanella Flattery, MD  ferrous sulfate 325 (65 FE) MG tablet Take 1 tablet (325 mg total) by mouth 2 (two) times daily with a meal. 03/22/20   Thurnell Lose, MD  guaiFENesin (MUCINEX) 600 MG  12 hr tablet Take 1 tablet (600 mg total) by mouth 2 (two) times daily as needed for cough or to loosen phlegm. 04/22/20   Lucrezia Starch, MD  latanoprost (XALATAN) 0.005 % ophthalmic solution Place 1 drop into both eyes at bedtime.  05/05/18   [provider]  lidocaine-prilocaine (EMLA) cream Apply 1 application topically See admin instructions. Apply to access site 1 hour before dialysis. Cover with saran wrap 06/07/18   [provider]  midodrine (PROAMATINE) 10 MG tablet Take 1 tablet (10 mg total) by mouth 3 (three) times daily. Take in the morning, lunch time and 4pm. Do not take after 4pm. 12/15/19   [name], MD  Multiple Vitamin (MULTIVITAMIN WITH MINERALS) TABS tablet Take 1 tablet by mouth  daily.    [provider]  multivitamin (RENA-VIT) TABS tablet Take 1 tablet by mouth at bedtime. 03/22/20   Thurnell Lose, MD  nystatin (MYCOSTATIN/NYSTOP) powder Use as directed twice per day Patient taking differently: Apply 1 g topically 2 (two) times daily as needed (rash).  07/06/16   [name], MD  pantoprazole (PROTONIX) 40 MG tablet Take 1 tablet (40 mg total) by mouth 2 (two) times daily. 03/22/20   Thurnell Lose, MD  predniSONE (DELTASONE) 50 MG tablet Take 1 tablet (50 mg total) by mouth 2 (two) times daily with a meal. 03/22/20   Thurnell Lose, MD  rosuvastatin (CRESTOR) 20 MG tablet Take 1 tablet (20 mg total) by mouth at bedtime. 12/15/19   [name], MD  Vitamin D, Ergocalciferol, (DRISDOL) 1.25 MG (50000 UNIT) CAPS capsule Take 1 capsule (50,000 Units total) by mouth every 7 (seven) days. 12/04/19   [name], MD    Allergies    Patient has no known allergies.  Review of Systems   Review of Systems  Unable to perform ROS: Dementia    Physical Exam Updated Vital Signs BP (!) 177/88   Pulse 87   Temp 98.7 F (37.1 C) (Oral)   Resp 16   SpO2 99%   Physical Exam Vitals and nursing note reviewed.  Constitutional:      Comments:  Chronically ill appearing, no distress  HENT:     Head: Normocephalic and atraumatic.     Nose: Nose normal.     Mouth/Throat:     Mouth: Mucous membranes are moist.  Eyes:     Pupils: Pupils are equal, round, and reactive to light.  Cardiovascular:     Rate and Rhythm: Normal rate.     Pulses: Normal pulses.     Heart sounds: Normal heart sounds.  Pulmonary:     Effort: Pulmonary effort is normal. No respiratory distress.     Breath sounds: No wheezing.     Comments: Dialysis catheter is c/d/i Abdominal:     General: Abdomen is flat.     Tenderness: There is no abdominal tenderness.  Musculoskeletal:        General: No tenderness or deformity.     Cervical back: Normal range of motion. No rigidity.  Skin:    General: Skin is warm and dry.  Neurological:     General: No focal deficit present.     Mental Status: He is alert.     Comments: Follows commands, answers basic questions     ED Results / Procedures / Treatments   Labs (all labs ordered are listed, but only abnormal results are displayed) Labs Reviewed  CBC WITH DIFFERENTIAL/PLATELET - Abnormal; Notable for the following components:      Result Value   WBC 12.1 (*)    RBC 2.96 (*)    Hemoglobin 9.8 (*)    HCT 31.2 (*)    MCV 105.4 (*)    RDW 21.7 (*)    Platelets 144 (*)    Neutro Abs 11.0 (*)    Lymphs Abs 0.2 (*)    Abs Immature Granulocytes 0.12 (*)    All other components within normal limits  BASIC METABOLIC PANEL - Abnormal; Notable for the following components:   Chloride 95 (*)    Creatinine, Ser 3.95 (*)    Calcium 8.4 (*)    GFR calc non Af Amer 13 (*)    GFR calc Af Amer 15 (*)  All other components within normal limits    EKG EKG Interpretation  Date/Time:  Thursday April 22 2020 10:47:32 EDT Ventricular Rate:  90 PR Interval:    QRS Duration: 97 QT Interval:  402 QTC Calculation: 492 R Axis:   -41 Text Interpretation: Sinus rhythm Multiple premature complexes, Probable left  atrial enlargement Left axis deviation Anterior infarct, old No acute changes no acute STEMI Confirmed by Madalyn Rob [phone]) on 04/22/2020 11:04:49 AM   Radiology DG Chest Portable 1 View  Result Date: 04/22/2020 CLINICAL DATA:  Shortness of breath, irregular heartbeat EXAM: PORTABLE CHEST 1 VIEW COMPARISON:  03/15/2020 FINDINGS: Right IJ approach hemodialysis catheter terminates at the level of the superior cavoatrial junction. Vascular stent within the left subclavian region. Stable cardiomegaly. Mild vascular congestion with increased interstitial markings bilaterally. Left costophrenic angle is obscured by positioning. No pneumothorax identified. IMPRESSION: Findings suggestive of CHF with mild edema. Electronically Signed   By: Davina Poke D.O.   On: 04/22/2020 11:33    Procedures Procedures (including critical care time)  Medications Ordered in ED Medications - No data to display  ED Course  I have reviewed the triage vital signs and the nursing notes.  Pertinent labs & imaging results that were available during my care of the patient were reviewed by me and considered in my medical decision making (see chart for details).    MDM Rules/Calculators/A&P                          86 y/o male with ESRD on HD MWF, HLD, anemia of chronic disease, orthostatic hypotension, depression/dementia presented to ER with concern for possible irregular heart beat.  On exam here, patient appears to be chronically ill-appearing but is in no acute distress.  His vital signs are stable.  His EKG demonstrates sinus rhythm with occasional PVC.  No arrhythmia on heart monitor in ER.  Suspect the reported irregular heartbeat was related to his frequent PVCs.  Lab work was grossly stable.  No electrolyte derangements, no change in hemoglobin.  CXR with findings suggestive of mild edema, per my review, there is no significant change when compared to last chest x-ray, appears stable.  Son notified me of  cough.  No bad coughing spells while in ER, no fever, no infiltrates to suggest pneumonia.  Recommend trial of as needed Mucinex or Tessalon Perles.  Given work up today and patient's current clinical status, believe he can be discharged back to SNF. Recommended follow up with primary doctor. Provided info for f/u with cardiology as well. Discussed details of work up and discharge plan with son at bedside.     After the discussed management above, the patient was determined to be safe for discharge.  The patient was in agreement with this plan and all questions regarding their care were answered.  ED return precautions were discussed and the patient will return to the ED with any significant worsening of condition.  Final Clinical Impression(s) / ED Diagnoses Final diagnoses:  Palpitations  Cough    Rx / DC Orders ED Discharge Orders         Ordered    guaiFENesin (MUCINEX) 600 MG 12 hr tablet  2 times daily PRN     Discontinue  Reprint     04/22/20 1414    benzonatate (TESSALON) 100 MG capsule  3 times daily PRN     Discontinue  Reprint     04/22/20 1414  Lucrezia Starch, MD 04/22/20 [phone]

## 2020-04-22 NOTE — Discharge Instructions (Addendum)
Recommend follow-up with primary doctor as well as cardiology regarding concern today.  Recommend taking Tessalon Perles as needed for coughing spells.  Also recommend taking the Mucinex as needed.  If he develops fever, difficulty breathing or other new concerning symptom, recommend return to ER for reassessment.

## 2020-04-22 NOTE — ED Notes (Signed)
RN called report to [facility], prescriptions reviewed. Pt left w/ PTAR

## 2020-04-26 ENCOUNTER — Other Ambulatory Visit: Payer: Self-pay

## 2020-04-26 ENCOUNTER — Emergency Department (HOSPITAL_COMMUNITY): Payer: Medicare Other

## 2020-04-26 ENCOUNTER — Emergency Department (HOSPITAL_COMMUNITY)
Admission: EM | Admit: 2020-04-26 | Discharge: 2020-04-26 | Disposition: A | Discharge status: Home / Self Care | Payer: Medicare Other | Attending: Emergency Medicine | Admitting: Emergency Medicine

## 2020-04-26 ENCOUNTER — Encounter (HOSPITAL_COMMUNITY): Payer: Self-pay | Admitting: Emergency Medicine

## 2020-04-26 DIAGNOSIS — I120 Hypertensive chronic kidney disease with stage 5 chronic kidney disease or end stage renal disease: Secondary | ICD-10-CM | POA: Diagnosis not present

## 2020-04-26 DIAGNOSIS — R0981 Nasal congestion: Secondary | ICD-10-CM | POA: Diagnosis not present

## 2020-04-26 DIAGNOSIS — Z5321 Procedure and treatment not carried out due to patient leaving prior to being seen by health care provider: Secondary | ICD-10-CM | POA: Diagnosis not present

## 2020-04-26 DIAGNOSIS — R05 Cough: Secondary | ICD-10-CM | POA: Insufficient documentation

## 2020-04-26 DIAGNOSIS — G9341 Metabolic encephalopathy: Secondary | ICD-10-CM | POA: Diagnosis not present

## 2020-04-26 DIAGNOSIS — J9811 Atelectasis: Secondary | ICD-10-CM | POA: Diagnosis not present

## 2020-04-26 DIAGNOSIS — J90 Pleural effusion, not elsewhere classified: Secondary | ICD-10-CM | POA: Diagnosis not present

## 2020-04-26 DIAGNOSIS — J156 Pneumonia due to other aerobic Gram-negative bacteria: Secondary | ICD-10-CM | POA: Diagnosis not present

## 2020-04-26 DIAGNOSIS — N186 End stage renal disease: Secondary | ICD-10-CM | POA: Diagnosis not present

## 2020-04-26 LAB — CBC WITH DIFFERENTIAL/PLATELET
Abs Immature Granulocytes: 0.16 10*3/uL — ABNORMAL HIGH (ref 0.00–0.07)
Basophils Absolute: 0 10*3/uL (ref 0.0–0.1)
Basophils Relative: 0 %
Eosinophils Absolute: 0 10*3/uL (ref 0.0–0.5)
Eosinophils Relative: 0 %
HCT: 34 % — ABNORMAL LOW (ref 39.0–52.0)
Hemoglobin: 10.3 g/dL — ABNORMAL LOW (ref 13.0–17.0)
Immature Granulocytes: 2 %
Lymphocytes Relative: 8 %
Lymphs Abs: 0.7 10*3/uL (ref 0.7–4.0)
MCH: 32.2 pg (ref 26.0–34.0)
MCHC: 30.3 g/dL (ref 30.0–36.0)
MCV: 106.3 fL — ABNORMAL HIGH (ref 80.0–100.0)
Monocytes Absolute: 0.7 10*3/uL (ref 0.1–1.0)
Monocytes Relative: 8 %
Neutro Abs: 8 10*3/uL — ABNORMAL HIGH (ref 1.7–7.7)
Neutrophils Relative %: 82 %
Platelets: 145 10*3/uL — ABNORMAL LOW (ref 150–400)
RBC: 3.2 MIL/uL — ABNORMAL LOW (ref 4.22–5.81)
RDW: 21 % — ABNORMAL HIGH (ref 11.5–15.5)
WBC: 9.7 10*3/uL (ref 4.0–10.5)
nRBC: 0.2 % (ref 0.0–0.2)

## 2020-04-26 LAB — BASIC METABOLIC PANEL
Anion gap: 17 — ABNORMAL HIGH (ref 5–15)
BUN: 41 mg/dL — ABNORMAL HIGH (ref 8–23)
CO2: 24 mmol/L (ref 22–32)
Calcium: 8.8 mg/dL — ABNORMAL LOW (ref 8.9–10.3)
Chloride: 94 mmol/L — ABNORMAL LOW (ref 98–111)
Creatinine, Ser: 6.36 mg/dL — ABNORMAL HIGH (ref 0.61–1.24)
GFR calc Af Amer: 8 mL/min — ABNORMAL LOW (ref >60)
GFR calc non Af Amer: 7 mL/min — ABNORMAL LOW (ref >60)
Glucose, Bld: 70 mg/dL (ref 70–99)
Potassium: 4.2 mmol/L (ref 3.5–5.1)
Sodium: 135 mmol/L (ref 135–145)

## 2020-04-26 NOTE — ED Notes (Signed)
Son in lobby and states he is taking pt to [facility] to be seen.  Explained wait for treatment room and he states pt is unable to wait any longer.  Encouraged for them to stay and they decline.

## 2020-04-26 NOTE — ED Notes (Signed)
Grandson came to ED to check on PT and mentioned Pt does dialysis on MWF and has had a low Hemoglobin.

## 2020-04-26 NOTE — ED Notes (Signed)
Son,[name] would like an update [phone]

## 2020-04-26 NOTE — ED Triage Notes (Signed)
EMS stated, cough and congestion x 1 week. Lower lobes in lungs sound wheezing. Here on June17 for the same. Given medication but it seems to be getting worse.

## 2020-04-27 DIAGNOSIS — Z96642 Presence of left artificial hip joint: Secondary | ICD-10-CM | POA: Diagnosis present

## 2020-04-27 DIAGNOSIS — Z743 Need for continuous supervision: Secondary | ICD-10-CM | POA: Diagnosis not present

## 2020-04-27 DIAGNOSIS — M109 Gout, unspecified: Secondary | ICD-10-CM | POA: Diagnosis present

## 2020-04-27 DIAGNOSIS — Z79899 Other long term (current) drug therapy: Secondary | ICD-10-CM | POA: Diagnosis not present

## 2020-04-27 DIAGNOSIS — R1312 Dysphagia, oropharyngeal phase: Secondary | ICD-10-CM | POA: Diagnosis present

## 2020-04-27 DIAGNOSIS — J156 Pneumonia due to other aerobic Gram-negative bacteria: Secondary | ICD-10-CM | POA: Diagnosis present

## 2020-04-27 DIAGNOSIS — I959 Hypotension, unspecified: Secondary | ICD-10-CM | POA: Diagnosis not present

## 2020-04-27 DIAGNOSIS — E213 Hyperparathyroidism, unspecified: Secondary | ICD-10-CM | POA: Diagnosis not present

## 2020-04-27 DIAGNOSIS — G9341 Metabolic encephalopathy: Secondary | ICD-10-CM | POA: Diagnosis present

## 2020-04-27 DIAGNOSIS — F0390 Unspecified dementia without behavioral disturbance: Secondary | ICD-10-CM | POA: Diagnosis present

## 2020-04-27 DIAGNOSIS — Z85048 Personal history of other malignant neoplasm of rectum, rectosigmoid junction, and anus: Secondary | ICD-10-CM | POA: Diagnosis not present

## 2020-04-27 DIAGNOSIS — E162 Hypoglycemia, unspecified: Secondary | ICD-10-CM | POA: Diagnosis not present

## 2020-04-27 DIAGNOSIS — N186 End stage renal disease: Secondary | ICD-10-CM | POA: Diagnosis present

## 2020-04-27 DIAGNOSIS — D631 Anemia in chronic kidney disease: Secondary | ICD-10-CM | POA: Diagnosis not present

## 2020-04-27 DIAGNOSIS — Z9181 History of falling: Secondary | ICD-10-CM | POA: Diagnosis not present

## 2020-04-27 DIAGNOSIS — I120 Hypertensive chronic kidney disease with stage 5 chronic kidney disease or end stage renal disease: Secondary | ICD-10-CM | POA: Diagnosis present

## 2020-04-27 DIAGNOSIS — Z8673 Personal history of transient ischemic attack (TIA), and cerebral infarction without residual deficits: Secondary | ICD-10-CM | POA: Diagnosis not present

## 2020-04-27 DIAGNOSIS — Z7409 Other reduced mobility: Secondary | ICD-10-CM | POA: Diagnosis present

## 2020-04-27 DIAGNOSIS — Z6822 Body mass index (BMI) 22.0-22.9, adult: Secondary | ICD-10-CM | POA: Diagnosis not present

## 2020-04-27 DIAGNOSIS — D509 Iron deficiency anemia, unspecified: Secondary | ICD-10-CM | POA: Diagnosis present

## 2020-04-27 DIAGNOSIS — Z992 Dependence on renal dialysis: Secondary | ICD-10-CM | POA: Diagnosis not present

## 2020-04-27 DIAGNOSIS — Z741 Need for assistance with personal care: Secondary | ICD-10-CM | POA: Diagnosis not present

## 2020-04-27 DIAGNOSIS — R531 Weakness: Secondary | ICD-10-CM | POA: Diagnosis not present

## 2020-04-27 DIAGNOSIS — J9811 Atelectasis: Secondary | ICD-10-CM | POA: Diagnosis present

## 2020-04-27 DIAGNOSIS — D638 Anemia in other chronic diseases classified elsewhere: Secondary | ICD-10-CM | POA: Diagnosis present

## 2020-04-27 DIAGNOSIS — I951 Orthostatic hypotension: Secondary | ICD-10-CM | POA: Diagnosis present

## 2020-04-27 DIAGNOSIS — J90 Pleural effusion, not elsewhere classified: Secondary | ICD-10-CM | POA: Diagnosis present

## 2020-04-27 DIAGNOSIS — E1122 Type 2 diabetes mellitus with diabetic chronic kidney disease: Secondary | ICD-10-CM | POA: Diagnosis not present

## 2020-04-27 DIAGNOSIS — Z7982 Long term (current) use of aspirin: Secondary | ICD-10-CM | POA: Diagnosis not present

## 2020-04-27 DIAGNOSIS — E440 Moderate protein-calorie malnutrition: Secondary | ICD-10-CM | POA: Diagnosis present

## 2020-04-27 DIAGNOSIS — F329 Major depressive disorder, single episode, unspecified: Secondary | ICD-10-CM | POA: Diagnosis present

## 2020-04-27 DIAGNOSIS — R1310 Dysphagia, unspecified: Secondary | ICD-10-CM | POA: Diagnosis not present

## 2020-04-27 DIAGNOSIS — J189 Pneumonia, unspecified organism: Secondary | ICD-10-CM | POA: Diagnosis not present

## 2020-04-29 ENCOUNTER — Ambulatory Visit: Payer: Medicare Other | Admitting: Internal Medicine

## 2020-05-20 ENCOUNTER — Ambulatory Visit (HOSPITAL_COMMUNITY): Admission: RE | Admit: 2020-05-20 | Payer: Medicare Other | Source: Home / Self Care | Admitting: Vascular Surgery

## 2020-05-20 SURGERY — INSERTION OF ARTERIOVENOUS (AV) GORE-TEX GRAFT ARM
Anesthesia: Choice | Site: Arm Upper | Laterality: Left

## 2020-06-02 ENCOUNTER — Ambulatory Visit: Payer: Medicare Other | Admitting: Internal Medicine

## 2020-06-06 DEATH — deceased

## 2020-06-09 ENCOUNTER — Ambulatory Visit: Payer: Medicare Other | Admitting: Internal Medicine

## 2020-07-13 ENCOUNTER — Other Ambulatory Visit: Payer: Self-pay | Admitting: Internal Medicine

## 2020-11-01 IMAGING — CT CT HEAD W/O CM
3 of 7 series · 14 of 47 positions shown, 16 images · non-contrast
Comparison: 03/06/2020

CLINICAL DATA: Evaluate right subdural hematoma

EXAM:
CT HEAD WITHOUT CONTRAST
TECHNIQUE: Contiguous axial images were obtained from the base of the skull
through the vertex without intravenous contrast.

[Series 4: head 5.0 h30s · axial · 0.40mm/px · z∈[+1406,+1516]mm · 8 of 30 slices shown, 10 images]
[im 4/30  brain]
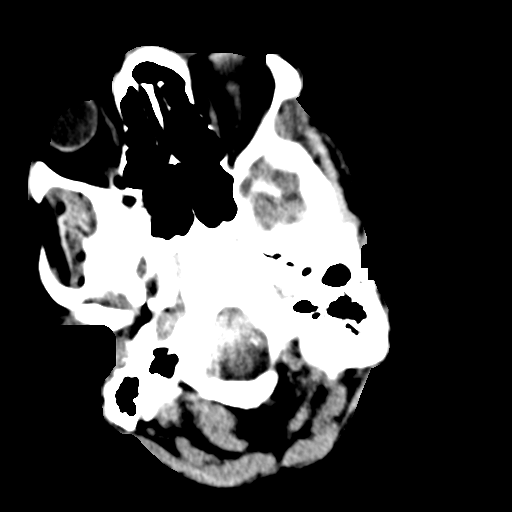
[im 4/30  bone]
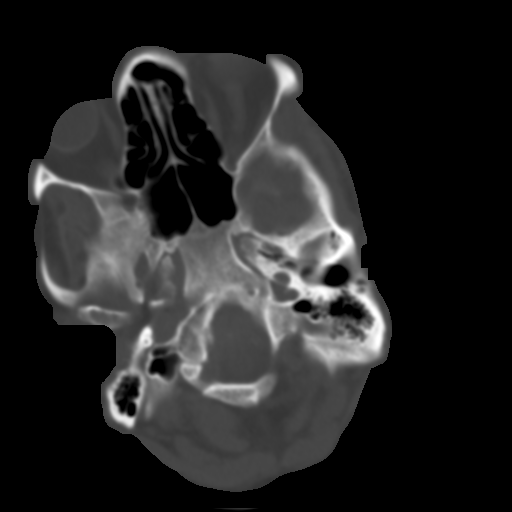
[im 7/30  brain]
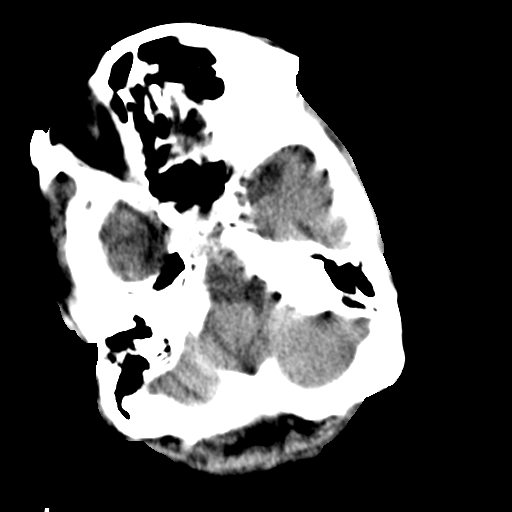
[im 10/30  brain]
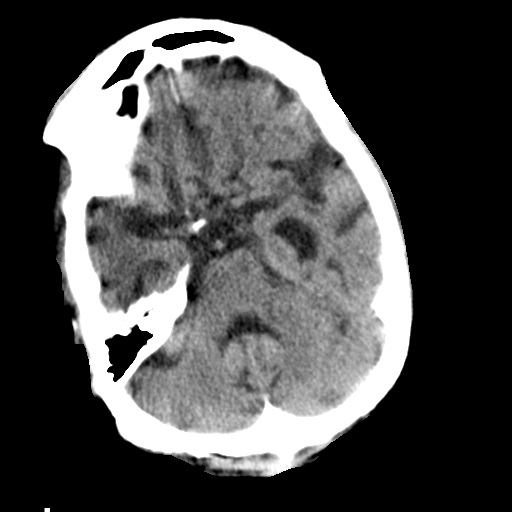
[im 13/30  brain]
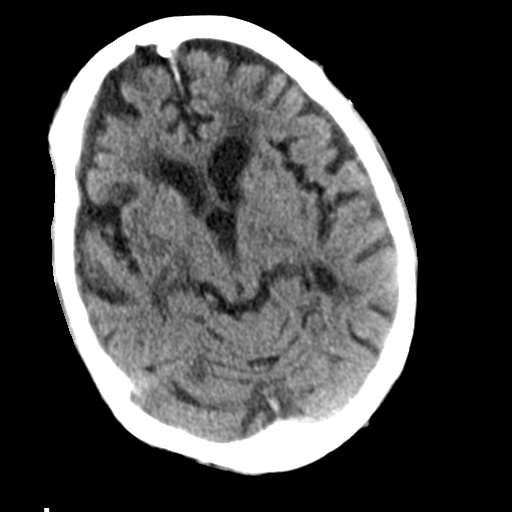
[im 17/30  brain]
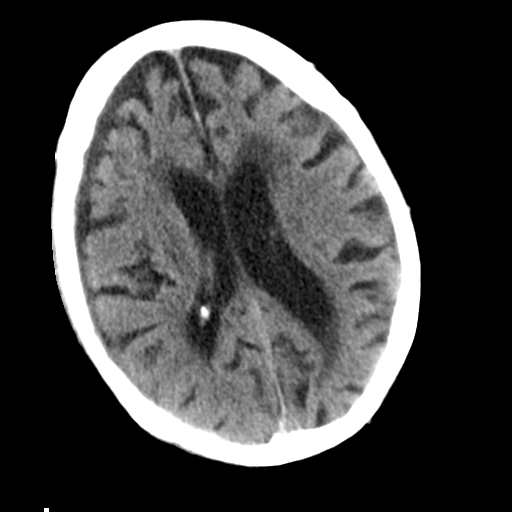
[im 17/30  bone]
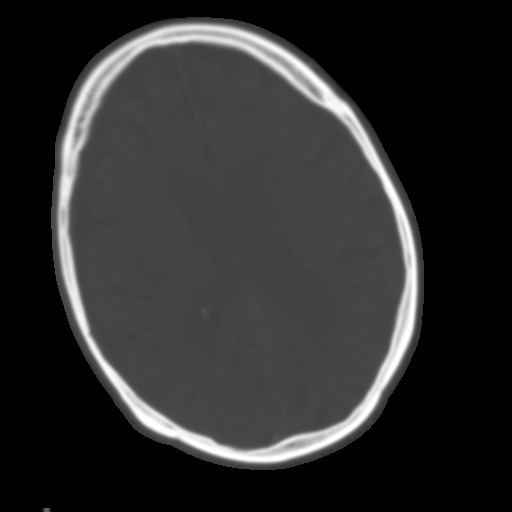
[im 20/30  brain]
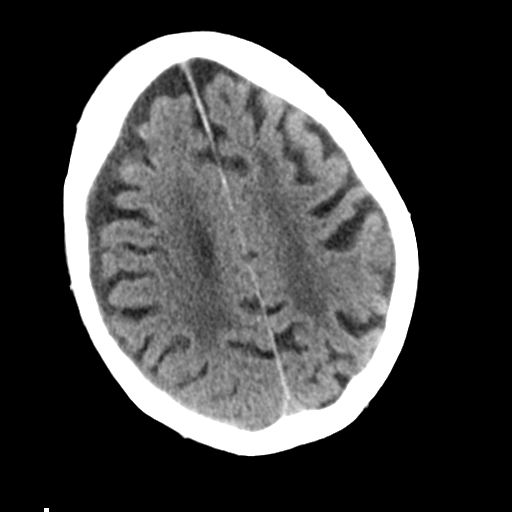
[im 23/30  brain]
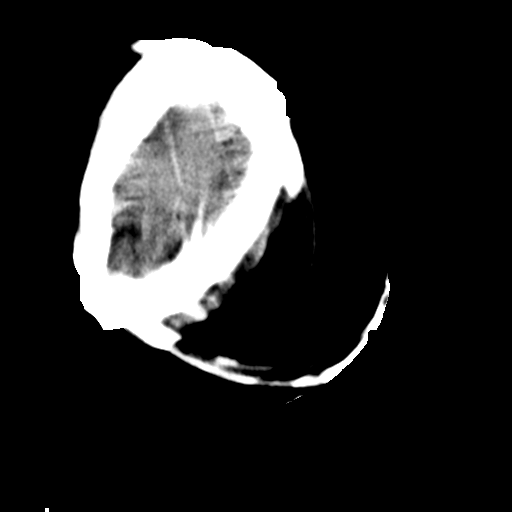
[im 26/30  brain]
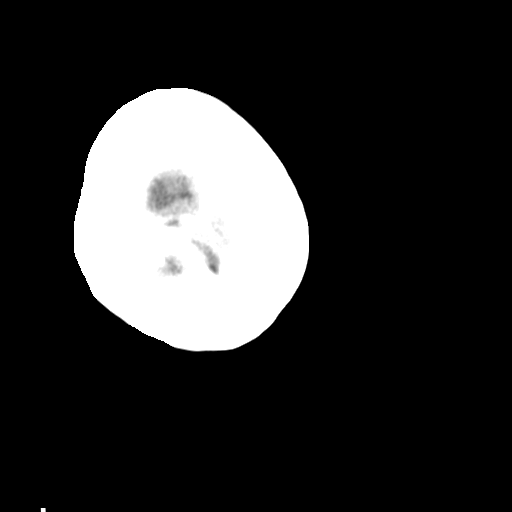

[Series 8: head 3.0 mpr cor · coronal · 0.29mm/px · 3 of 73 slices shown]
[im 19/73  brain]
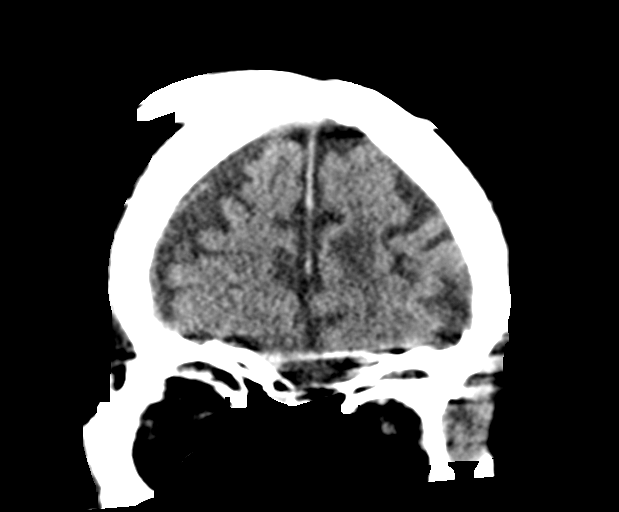
[im 37/73  brain]
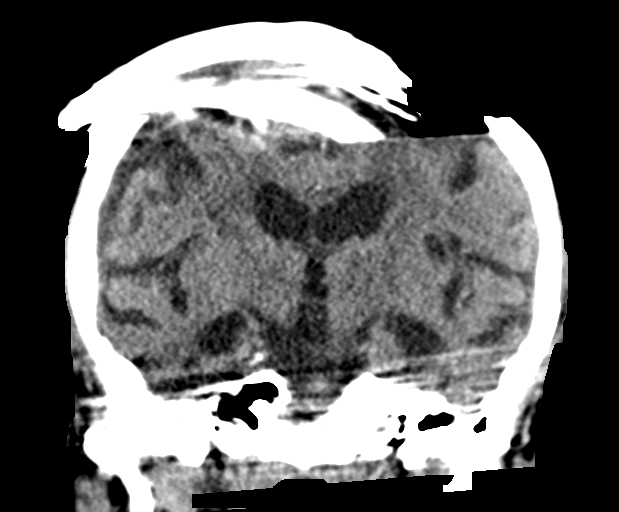
[im 55/73  brain]
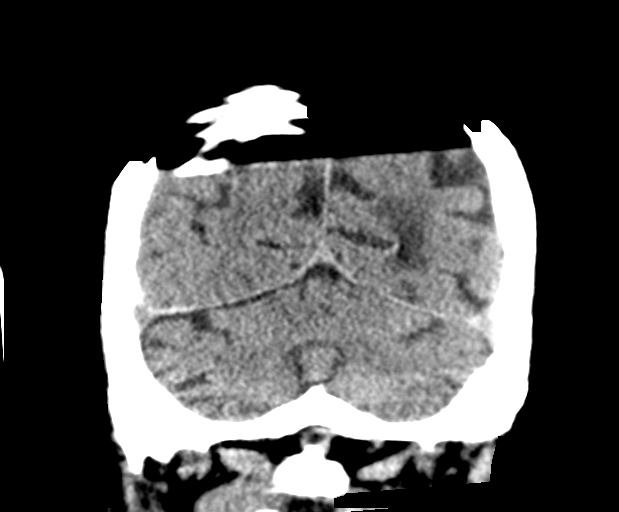

[Series 9: head 3.0 mpr sag · sagittal · 0.29mm/px · 3 of 57 slices shown]
[im 35/57  brain]
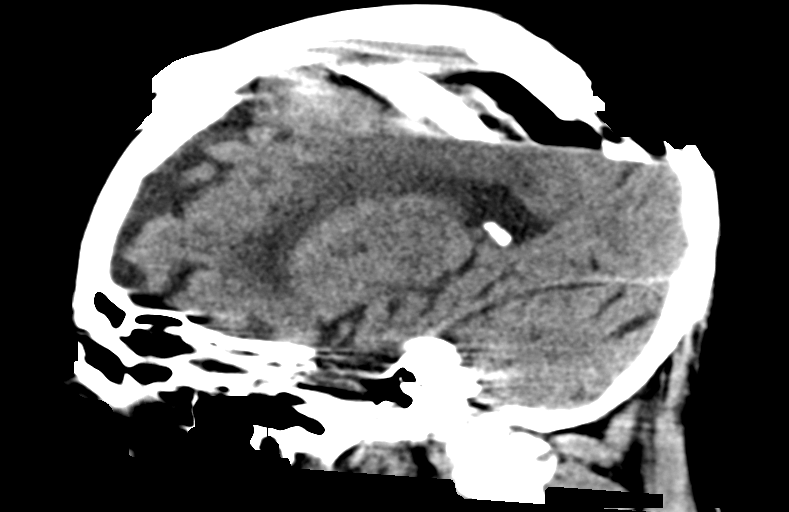
[im 42/57  brain]
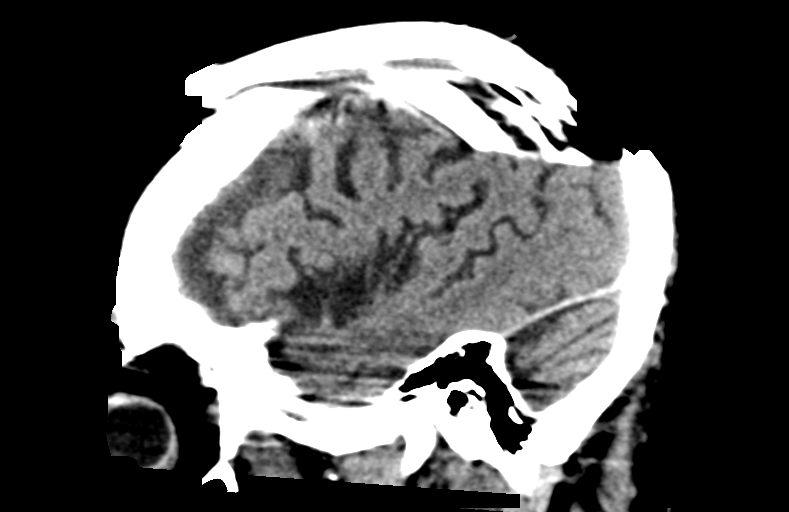
[im 49/57  brain]
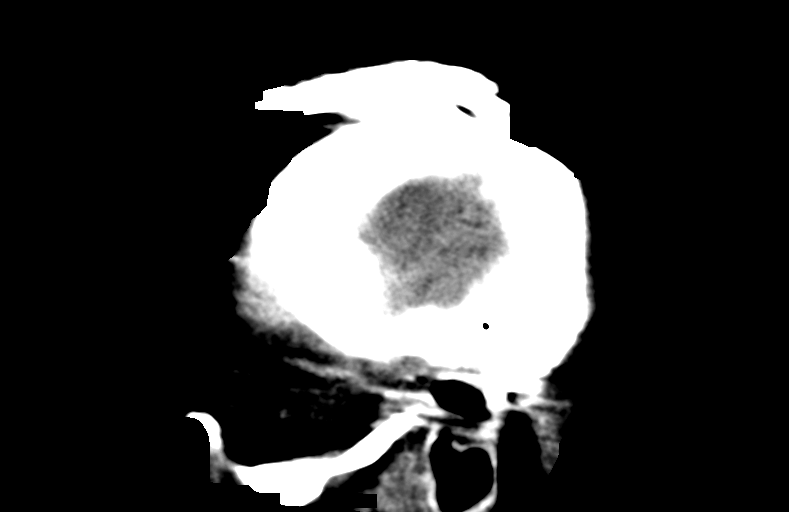

[14 of 47 positions shown; findings below may reference images not displayed]

FINDINGS: Technical note: Examination degraded by motion artifact.

Brain: Redemonstration of a slightly hyperdense extra-axial
collection overlying the right cerebral convexity, which appears
minimally decreased in size compared to previous exam. Collection
measures up to 8 mm in maximal thickness (series 10, image 25),
previously measured 10 mm when remeasured at a similar location. No
significant shift of the midline structures. No evidence of new
large territory infarction. No hydrocephalus. Extensive low-density
changes within the periventricular and subcortical white matter
compatible with chronic microvascular ischemic change. Moderate
diffuse cerebral volume loss.

Vascular: Atherosclerotic calcifications involving the large vessels
of the skull base. No unexpected hyperdense vessel.

Skull: Negative for calvarial fracture.

Sinuses/Orbits: Paranasal sinuses are clear. Chronic soft tissue
thickening and erosion of the external auditory canal.

Other: None.
IMPRESSION: 1. Redemonstration of a right-sided subdural hematoma, which appears
minimally decreased in size compared to previous exam. No
significant shift of the midline structures.
2. Extensive low-density changes within the periventricular and
subcortical white matter compatible with chronic microvascular
ischemic change.
3. Moderate diffuse cerebral volume loss.
4. Soft tissue thickening with bony erosion at the right external
auditory canal, similar in appearance to temporal bone CT of
08/19/2019.
